# Patient Record
Sex: Male | Born: 1940 | Race: White | Hispanic: No | Marital: Single | Smoking: Current every day smoker
Health system: Southern US, Community
[De-identification: ages and names within clinical notes are randomized; demographics above are authoritative.]

## PROBLEM LIST (undated history)

## (undated) DIAGNOSIS — G20 Parkinson's disease: Secondary | ICD-10-CM

## (undated) DIAGNOSIS — E7800 Pure hypercholesterolemia, unspecified: Secondary | ICD-10-CM

## (undated) DIAGNOSIS — R259 Unspecified abnormal involuntary movements: Secondary | ICD-10-CM

## (undated) DIAGNOSIS — I714 Abdominal aortic aneurysm, without rupture, unspecified: Secondary | ICD-10-CM

## (undated) DIAGNOSIS — T1491XA Suicide attempt, initial encounter: Secondary | ICD-10-CM

## (undated) DIAGNOSIS — F329 Major depressive disorder, single episode, unspecified: Secondary | ICD-10-CM

## (undated) DIAGNOSIS — F32A Depression, unspecified: Secondary | ICD-10-CM

## (undated) DIAGNOSIS — G252 Other specified forms of tremor: Secondary | ICD-10-CM

## (undated) DIAGNOSIS — N189 Chronic kidney disease, unspecified: Secondary | ICD-10-CM

## (undated) DIAGNOSIS — N289 Disorder of kidney and ureter, unspecified: Secondary | ICD-10-CM

## (undated) DIAGNOSIS — F319 Bipolar disorder, unspecified: Secondary | ICD-10-CM

## (undated) DIAGNOSIS — G20A1 Parkinson's disease without dyskinesia, without mention of fluctuations: Secondary | ICD-10-CM

## (undated) HISTORY — DX: Abdominal aortic aneurysm, without rupture, unspecified: I71.40

## (undated) HISTORY — DX: Abdominal aortic aneurysm, without rupture: I71.4

## (undated) HISTORY — DX: Other specified forms of tremor: G25.2

## (undated) HISTORY — DX: Depression, unspecified: F32.A

## (undated) HISTORY — DX: Major depressive disorder, single episode, unspecified: F32.9

## (undated) HISTORY — DX: Pure hypercholesterolemia, unspecified: E78.00

## (undated) HISTORY — DX: Parkinson's disease without dyskinesia, without mention of fluctuations: G20.A1

## (undated) HISTORY — DX: Bipolar disorder, unspecified: F31.9

## (undated) HISTORY — DX: Disorder of kidney and ureter, unspecified: N28.9

## (undated) HISTORY — DX: Suicide attempt, initial encounter: T14.91XA

## (undated) HISTORY — DX: Parkinson's disease: G20

## (undated) HISTORY — DX: Unspecified abnormal involuntary movements: R25.9

## (undated) HISTORY — DX: Chronic kidney disease, unspecified: N18.9

---

## 1970-08-01 HISTORY — PX: KNEE ARTHROSCOPY: SHX127

## 2003-05-06 ENCOUNTER — Emergency Department (HOSPITAL_COMMUNITY): Admission: EM | Admit: 2003-05-06 | Discharge: 2003-05-06 | Payer: Self-pay | Admitting: Emergency Medicine

## 2005-02-09 ENCOUNTER — Inpatient Hospital Stay (HOSPITAL_COMMUNITY): Admission: EM | Admit: 2005-02-09 | Discharge: 2005-04-08 | Payer: Self-pay | Admitting: Emergency Medicine

## 2005-02-09 ENCOUNTER — Ambulatory Visit: Payer: Self-pay | Admitting: Cardiology

## 2005-02-09 ENCOUNTER — Encounter: Payer: Self-pay | Admitting: Cardiology

## 2005-02-09 ENCOUNTER — Ambulatory Visit: Payer: Self-pay | Admitting: Internal Medicine

## 2005-02-24 ENCOUNTER — Ambulatory Visit: Payer: Self-pay | Admitting: Gastroenterology

## 2005-03-01 DIAGNOSIS — T1491XA Suicide attempt, initial encounter: Secondary | ICD-10-CM

## 2005-03-01 HISTORY — DX: Suicide attempt, initial encounter: T14.91XA

## 2005-04-02 ENCOUNTER — Ambulatory Visit: Payer: Self-pay | Admitting: Hospitalist

## 2005-04-08 ENCOUNTER — Inpatient Hospital Stay (HOSPITAL_COMMUNITY): Admission: RE | Admit: 2005-04-08 | Discharge: 2005-04-15 | Payer: Self-pay | Admitting: Psychiatry

## 2005-04-08 ENCOUNTER — Ambulatory Visit: Payer: Self-pay | Admitting: Psychiatry

## 2005-05-04 ENCOUNTER — Ambulatory Visit (HOSPITAL_COMMUNITY): Payer: Self-pay | Admitting: Psychiatry

## 2005-05-23 ENCOUNTER — Ambulatory Visit (HOSPITAL_COMMUNITY): Payer: Self-pay | Admitting: Psychiatry

## 2005-05-30 ENCOUNTER — Ambulatory Visit (HOSPITAL_COMMUNITY): Payer: Self-pay | Admitting: Psychiatry

## 2005-06-05 ENCOUNTER — Emergency Department (HOSPITAL_COMMUNITY): Admission: EM | Admit: 2005-06-05 | Discharge: 2005-06-05 | Payer: Self-pay | Admitting: Emergency Medicine

## 2005-06-06 ENCOUNTER — Ambulatory Visit (HOSPITAL_COMMUNITY): Payer: Self-pay | Admitting: Psychiatry

## 2005-07-11 ENCOUNTER — Ambulatory Visit (HOSPITAL_COMMUNITY): Payer: Self-pay | Admitting: Psychiatry

## 2005-07-19 ENCOUNTER — Ambulatory Visit (HOSPITAL_COMMUNITY): Payer: Self-pay | Admitting: Licensed Clinical Social Worker

## 2005-08-24 ENCOUNTER — Ambulatory Visit (HOSPITAL_COMMUNITY): Payer: Self-pay | Admitting: Psychiatry

## 2005-09-21 ENCOUNTER — Ambulatory Visit (HOSPITAL_COMMUNITY): Payer: Self-pay | Admitting: Licensed Clinical Social Worker

## 2005-10-13 ENCOUNTER — Ambulatory Visit (HOSPITAL_COMMUNITY): Payer: Self-pay | Admitting: Psychiatry

## 2005-10-17 ENCOUNTER — Ambulatory Visit (HOSPITAL_COMMUNITY): Payer: Self-pay | Admitting: Psychiatry

## 2005-11-02 ENCOUNTER — Ambulatory Visit (HOSPITAL_COMMUNITY): Payer: Self-pay | Admitting: Licensed Clinical Social Worker

## 2005-12-19 ENCOUNTER — Ambulatory Visit (HOSPITAL_COMMUNITY): Payer: Self-pay | Admitting: Psychiatry

## 2006-02-16 ENCOUNTER — Ambulatory Visit (HOSPITAL_COMMUNITY): Payer: Self-pay | Admitting: Licensed Clinical Social Worker

## 2006-02-20 ENCOUNTER — Ambulatory Visit (HOSPITAL_COMMUNITY): Payer: Self-pay | Admitting: Psychiatry

## 2006-02-27 ENCOUNTER — Ambulatory Visit (HOSPITAL_COMMUNITY): Payer: Self-pay | Admitting: Licensed Clinical Social Worker

## 2006-03-13 ENCOUNTER — Ambulatory Visit (HOSPITAL_COMMUNITY): Payer: Self-pay | Admitting: Psychiatry

## 2006-03-16 ENCOUNTER — Ambulatory Visit (HOSPITAL_COMMUNITY): Payer: Self-pay | Admitting: Licensed Clinical Social Worker

## 2006-05-01 ENCOUNTER — Ambulatory Visit (HOSPITAL_COMMUNITY): Payer: Self-pay | Admitting: Psychiatry

## 2006-05-16 ENCOUNTER — Ambulatory Visit (HOSPITAL_COMMUNITY): Payer: Self-pay | Admitting: Licensed Clinical Social Worker

## 2006-08-23 ENCOUNTER — Ambulatory Visit (HOSPITAL_COMMUNITY): Payer: Self-pay | Admitting: Psychiatry

## 2006-09-18 ENCOUNTER — Ambulatory Visit (HOSPITAL_COMMUNITY): Payer: Self-pay | Admitting: Psychiatry

## 2006-11-13 ENCOUNTER — Ambulatory Visit (HOSPITAL_COMMUNITY): Payer: Self-pay | Admitting: Psychiatry

## 2006-11-21 ENCOUNTER — Ambulatory Visit (HOSPITAL_COMMUNITY): Payer: Self-pay | Admitting: Licensed Clinical Social Worker

## 2006-12-12 ENCOUNTER — Ambulatory Visit (HOSPITAL_COMMUNITY): Payer: Self-pay | Admitting: Licensed Clinical Social Worker

## 2007-01-03 ENCOUNTER — Ambulatory Visit (HOSPITAL_COMMUNITY): Payer: Self-pay | Admitting: Licensed Clinical Social Worker

## 2007-01-17 ENCOUNTER — Ambulatory Visit (HOSPITAL_COMMUNITY): Payer: Self-pay | Admitting: Psychiatry

## 2007-01-29 ENCOUNTER — Ambulatory Visit (HOSPITAL_COMMUNITY): Payer: Self-pay | Admitting: Licensed Clinical Social Worker

## 2007-02-26 ENCOUNTER — Ambulatory Visit (HOSPITAL_COMMUNITY): Payer: Self-pay | Admitting: Licensed Clinical Social Worker

## 2007-03-14 ENCOUNTER — Ambulatory Visit (HOSPITAL_COMMUNITY): Payer: Self-pay | Admitting: Psychiatry

## 2007-03-26 ENCOUNTER — Ambulatory Visit (HOSPITAL_COMMUNITY): Payer: Self-pay | Admitting: Licensed Clinical Social Worker

## 2007-04-30 ENCOUNTER — Ambulatory Visit (HOSPITAL_COMMUNITY): Payer: Self-pay | Admitting: Licensed Clinical Social Worker

## 2007-05-14 ENCOUNTER — Ambulatory Visit (HOSPITAL_COMMUNITY): Payer: Self-pay | Admitting: Psychiatry

## 2007-05-29 ENCOUNTER — Ambulatory Visit (HOSPITAL_COMMUNITY): Payer: Self-pay | Admitting: Licensed Clinical Social Worker

## 2007-07-23 ENCOUNTER — Ambulatory Visit (HOSPITAL_COMMUNITY): Payer: Self-pay | Admitting: Licensed Clinical Social Worker

## 2007-08-13 ENCOUNTER — Ambulatory Visit (HOSPITAL_COMMUNITY): Payer: Self-pay | Admitting: Psychiatry

## 2007-08-27 ENCOUNTER — Ambulatory Visit (HOSPITAL_COMMUNITY): Payer: Self-pay | Admitting: Licensed Clinical Social Worker

## 2007-10-11 ENCOUNTER — Ambulatory Visit (HOSPITAL_COMMUNITY): Payer: Self-pay | Admitting: Licensed Clinical Social Worker

## 2007-11-07 ENCOUNTER — Ambulatory Visit (HOSPITAL_COMMUNITY): Payer: Self-pay | Admitting: Psychiatry

## 2007-11-28 ENCOUNTER — Ambulatory Visit (HOSPITAL_COMMUNITY): Payer: Self-pay | Admitting: Licensed Clinical Social Worker

## 2008-01-02 ENCOUNTER — Ambulatory Visit (HOSPITAL_COMMUNITY): Payer: Self-pay | Admitting: Licensed Clinical Social Worker

## 2008-02-04 ENCOUNTER — Ambulatory Visit (HOSPITAL_COMMUNITY): Payer: Self-pay | Admitting: Psychiatry

## 2008-03-05 ENCOUNTER — Ambulatory Visit (HOSPITAL_COMMUNITY): Payer: Self-pay | Admitting: Licensed Clinical Social Worker

## 2008-03-24 ENCOUNTER — Ambulatory Visit (HOSPITAL_COMMUNITY): Payer: Self-pay | Admitting: Psychiatry

## 2008-04-14 ENCOUNTER — Ambulatory Visit (HOSPITAL_COMMUNITY): Payer: Self-pay | Admitting: Licensed Clinical Social Worker

## 2008-05-12 ENCOUNTER — Ambulatory Visit (HOSPITAL_COMMUNITY): Payer: Self-pay | Admitting: Licensed Clinical Social Worker

## 2008-06-18 ENCOUNTER — Ambulatory Visit (HOSPITAL_COMMUNITY): Payer: Self-pay | Admitting: Licensed Clinical Social Worker

## 2008-08-11 ENCOUNTER — Ambulatory Visit (HOSPITAL_COMMUNITY): Payer: Self-pay | Admitting: Psychiatry

## 2008-08-18 ENCOUNTER — Ambulatory Visit (HOSPITAL_COMMUNITY): Payer: Self-pay | Admitting: Licensed Clinical Social Worker

## 2008-08-18 ENCOUNTER — Emergency Department (HOSPITAL_COMMUNITY): Admission: EM | Admit: 2008-08-18 | Discharge: 2008-08-18 | Payer: Self-pay | Admitting: Family Medicine

## 2008-09-15 ENCOUNTER — Ambulatory Visit (HOSPITAL_COMMUNITY): Payer: Self-pay | Admitting: Licensed Clinical Social Worker

## 2008-10-28 ENCOUNTER — Ambulatory Visit (HOSPITAL_COMMUNITY): Payer: Self-pay | Admitting: Licensed Clinical Social Worker

## 2008-11-03 ENCOUNTER — Ambulatory Visit (HOSPITAL_COMMUNITY): Payer: Self-pay | Admitting: Psychiatry

## 2008-11-21 ENCOUNTER — Ambulatory Visit: Payer: Self-pay | Admitting: Vascular Surgery

## 2008-11-26 ENCOUNTER — Ambulatory Visit (HOSPITAL_COMMUNITY): Payer: Self-pay | Admitting: Licensed Clinical Social Worker

## 2008-12-17 ENCOUNTER — Ambulatory Visit (HOSPITAL_COMMUNITY): Payer: Self-pay | Admitting: Licensed Clinical Social Worker

## 2009-01-16 ENCOUNTER — Ambulatory Visit (HOSPITAL_COMMUNITY): Payer: Self-pay | Admitting: Licensed Clinical Social Worker

## 2009-01-28 ENCOUNTER — Ambulatory Visit (HOSPITAL_COMMUNITY): Payer: Self-pay | Admitting: Psychiatry

## 2009-02-06 ENCOUNTER — Ambulatory Visit (HOSPITAL_COMMUNITY): Payer: Self-pay | Admitting: Licensed Clinical Social Worker

## 2009-02-09 ENCOUNTER — Ambulatory Visit (HOSPITAL_COMMUNITY): Payer: Self-pay | Admitting: Psychiatry

## 2009-02-11 ENCOUNTER — Ambulatory Visit (HOSPITAL_COMMUNITY): Payer: Self-pay | Admitting: Licensed Clinical Social Worker

## 2009-02-18 ENCOUNTER — Ambulatory Visit (HOSPITAL_COMMUNITY): Payer: Self-pay | Admitting: Licensed Clinical Social Worker

## 2009-03-31 ENCOUNTER — Ambulatory Visit (HOSPITAL_COMMUNITY): Payer: Self-pay | Admitting: Licensed Clinical Social Worker

## 2009-04-08 ENCOUNTER — Ambulatory Visit (HOSPITAL_COMMUNITY): Payer: Self-pay | Admitting: Psychiatry

## 2009-04-14 ENCOUNTER — Ambulatory Visit (HOSPITAL_COMMUNITY): Payer: Self-pay | Admitting: Licensed Clinical Social Worker

## 2009-04-22 ENCOUNTER — Ambulatory Visit (HOSPITAL_COMMUNITY): Payer: Self-pay | Admitting: Licensed Clinical Social Worker

## 2009-05-05 ENCOUNTER — Ambulatory Visit (HOSPITAL_COMMUNITY): Payer: Self-pay | Admitting: Psychiatry

## 2009-05-20 ENCOUNTER — Ambulatory Visit (HOSPITAL_COMMUNITY): Payer: Self-pay | Admitting: Psychiatry

## 2009-05-22 ENCOUNTER — Ambulatory Visit (HOSPITAL_COMMUNITY): Payer: Self-pay | Admitting: Licensed Clinical Social Worker

## 2009-06-10 ENCOUNTER — Ambulatory Visit (HOSPITAL_COMMUNITY): Payer: Self-pay | Admitting: Licensed Clinical Social Worker

## 2009-07-02 ENCOUNTER — Ambulatory Visit (HOSPITAL_COMMUNITY): Payer: Self-pay | Admitting: Licensed Clinical Social Worker

## 2009-07-29 ENCOUNTER — Ambulatory Visit (HOSPITAL_COMMUNITY): Payer: Self-pay | Admitting: Licensed Clinical Social Worker

## 2009-08-10 ENCOUNTER — Ambulatory Visit (HOSPITAL_COMMUNITY): Payer: Self-pay | Admitting: Psychiatry

## 2009-09-04 ENCOUNTER — Ambulatory Visit (HOSPITAL_COMMUNITY): Payer: Self-pay | Admitting: Licensed Clinical Social Worker

## 2009-09-21 ENCOUNTER — Ambulatory Visit (HOSPITAL_COMMUNITY): Payer: Self-pay | Admitting: Licensed Clinical Social Worker

## 2009-10-20 ENCOUNTER — Ambulatory Visit (HOSPITAL_COMMUNITY): Payer: Self-pay | Admitting: Licensed Clinical Social Worker

## 2009-11-06 ENCOUNTER — Ambulatory Visit (HOSPITAL_COMMUNITY): Payer: Self-pay | Admitting: Psychiatry

## 2009-11-19 ENCOUNTER — Ambulatory Visit (HOSPITAL_COMMUNITY): Payer: Self-pay | Admitting: Licensed Clinical Social Worker

## 2009-11-27 ENCOUNTER — Ambulatory Visit: Payer: Self-pay | Admitting: Vascular Surgery

## 2009-12-16 ENCOUNTER — Ambulatory Visit (HOSPITAL_COMMUNITY): Payer: Self-pay | Admitting: Licensed Clinical Social Worker

## 2010-01-18 ENCOUNTER — Ambulatory Visit (HOSPITAL_COMMUNITY): Payer: Self-pay | Admitting: Licensed Clinical Social Worker

## 2010-02-03 ENCOUNTER — Ambulatory Visit (HOSPITAL_COMMUNITY): Payer: Self-pay | Admitting: Psychiatry

## 2010-02-15 ENCOUNTER — Ambulatory Visit (HOSPITAL_COMMUNITY): Payer: Self-pay | Admitting: Licensed Clinical Social Worker

## 2010-03-01 ENCOUNTER — Ambulatory Visit (HOSPITAL_COMMUNITY): Payer: Self-pay | Admitting: Licensed Clinical Social Worker

## 2010-03-31 ENCOUNTER — Ambulatory Visit (HOSPITAL_COMMUNITY): Payer: Self-pay | Admitting: Licensed Clinical Social Worker

## 2010-04-07 ENCOUNTER — Ambulatory Visit (HOSPITAL_COMMUNITY): Payer: Self-pay | Admitting: Psychiatry

## 2010-05-03 ENCOUNTER — Ambulatory Visit (HOSPITAL_COMMUNITY): Payer: Self-pay | Admitting: Licensed Clinical Social Worker

## 2010-06-04 ENCOUNTER — Ambulatory Visit (HOSPITAL_COMMUNITY): Payer: Self-pay | Admitting: Licensed Clinical Social Worker

## 2010-07-01 ENCOUNTER — Ambulatory Visit (HOSPITAL_COMMUNITY): Payer: Self-pay | Admitting: Licensed Clinical Social Worker

## 2010-08-04 ENCOUNTER — Ambulatory Visit (HOSPITAL_COMMUNITY)
Admission: RE | Admit: 2010-08-04 | Discharge: 2010-08-04 | Payer: Self-pay | Source: Home / Self Care | Attending: Psychiatry | Admitting: Psychiatry

## 2010-08-16 ENCOUNTER — Ambulatory Visit (HOSPITAL_COMMUNITY)
Admission: RE | Admit: 2010-08-16 | Discharge: 2010-08-16 | Payer: Self-pay | Source: Home / Self Care | Attending: Licensed Clinical Social Worker | Admitting: Licensed Clinical Social Worker

## 2010-10-05 ENCOUNTER — Encounter (HOSPITAL_COMMUNITY): Payer: Self-pay | Admitting: Licensed Clinical Social Worker

## 2010-10-12 ENCOUNTER — Encounter (HOSPITAL_COMMUNITY): Payer: Medicare Other | Admitting: Licensed Clinical Social Worker

## 2010-10-12 DIAGNOSIS — F39 Unspecified mood [affective] disorder: Secondary | ICD-10-CM

## 2010-10-21 ENCOUNTER — Ambulatory Visit (HOSPITAL_COMMUNITY)
Admission: RE | Admit: 2010-10-21 | Discharge: 2010-10-21 | Disposition: A | Discharge status: Home / Self Care | Payer: Medicare Other | Source: Ambulatory Visit | Attending: Psychiatry | Admitting: Psychiatry

## 2010-10-21 DIAGNOSIS — Z79899 Other long term (current) drug therapy: Secondary | ICD-10-CM | POA: Insufficient documentation

## 2010-10-21 DIAGNOSIS — F39 Unspecified mood [affective] disorder: Secondary | ICD-10-CM | POA: Insufficient documentation

## 2010-10-21 DIAGNOSIS — F319 Bipolar disorder, unspecified: Secondary | ICD-10-CM | POA: Insufficient documentation

## 2010-10-21 LAB — CBC
HCT: 46.3 % (ref 39.0–52.0)
Hemoglobin: 15.1 g/dL (ref 13.0–17.0)
MCH: 28.9 pg (ref 26.0–34.0)
MCHC: 32.6 g/dL (ref 30.0–36.0)
MCV: 88.5 fL (ref 78.0–100.0)
Platelets: 161 10*3/uL (ref 150–400)
RBC: 5.23 MIL/uL (ref 4.22–5.81)
RDW: 13.2 % (ref 11.5–15.5)
WBC: 6.8 10*3/uL (ref 4.0–10.5)

## 2010-10-21 LAB — COMPREHENSIVE METABOLIC PANEL
ALT: 11 U/L (ref 0–53)
AST: 15 U/L (ref 0–37)
Albumin: 3.7 g/dL (ref 3.5–5.2)
Alkaline Phosphatase: 130 U/L — ABNORMAL HIGH (ref 39–117)
BUN: 18 mg/dL (ref 6–23)
CO2: 24 mEq/L (ref 19–32)
Calcium: 9.9 mg/dL (ref 8.4–10.5)
Chloride: 104 mEq/L (ref 96–112)
Creatinine, Ser: 1.28 mg/dL (ref 0.4–1.5)
GFR calc Af Amer: >60 mL/min (ref >60)
GFR calc non Af Amer: 56 mL/min — ABNORMAL LOW (ref >60)
Glucose, Bld: 108 mg/dL — ABNORMAL HIGH (ref 70–99)
Potassium: 4.8 mEq/L (ref 3.5–5.1)
Sodium: 136 mEq/L (ref 135–145)
Total Bilirubin: 0.3 mg/dL (ref 0.3–1.2)
Total Protein: 7.6 g/dL (ref 6.0–8.3)

## 2010-10-21 LAB — HEMOGLOBIN A1C
Hgb A1c MFr Bld: 5.8 % — ABNORMAL HIGH (ref <5.7)
Mean Plasma Glucose: 120 mg/dL — ABNORMAL HIGH (ref <117)

## 2010-11-03 ENCOUNTER — Encounter (HOSPITAL_COMMUNITY): Payer: Medicare Other | Admitting: Psychiatry

## 2010-11-03 DIAGNOSIS — F39 Unspecified mood [affective] disorder: Secondary | ICD-10-CM

## 2010-11-09 ENCOUNTER — Ambulatory Visit (INDEPENDENT_AMBULATORY_CARE_PROVIDER_SITE_OTHER): Payer: Medicare Other | Admitting: Vascular Surgery

## 2010-11-09 ENCOUNTER — Encounter (INDEPENDENT_AMBULATORY_CARE_PROVIDER_SITE_OTHER): Payer: Medicare Other

## 2010-11-09 DIAGNOSIS — I714 Abdominal aortic aneurysm, without rupture, unspecified: Secondary | ICD-10-CM

## 2010-11-09 NOTE — Assessment & Plan Note (Signed)
OFFICE VISIT  Fernando Carr, Fernando Carr DOB:  Dec 07, 1940                                       11/09/2010 ZOXWR#:[id number]  Patient presents today for continued follow-up of his asymptomatic infrarenal abdominal aortic aneurysm.  I saw him 1 year ago where he had initial discovery of an aneurysm when he was having an ultrasound of his kidneys to evaluate renal insufficiency.  He reports that he has had stable renal function and has been discharged from active followup with Dr. Hyman Hopes.  He has no major medical difficulties and no new major medical problems.  He does not have any cardiac disease.  No symptoms referable to his aneurysm.  Denies hypertension.  He does have chronic cigarette smoking.  PHYSICAL EXAMINATION:  A well-developed and well-nourished white male appearing stated age in no acute stress.  Blood pressure is 111/70, pulse 59, respirations 18.  HEENT is normal.  His abdomen is soft, nontender, moderately obese.  I do not feel an aneurysm. Musculoskeletal shows no major deformity or cyanosis.  Neurologic:  No focal weakness or paresthesias.  Skin without ulcers or rashes.  He does have 2+ right anterior tibial and 2+ left posterior tibial pulse and does not have any evidence of popliteal artery aneurysms by physical exam.  He underwent repeat ultrasound of his aorta, which I have ordered and independently reviewed.  This does show no change with a maximal diameter of 3.5, which is the same as a study from 1 year ago.  I discussed this at length with patient. I explained that this is very reassuring with a relatively small aneurysm and no change in 1 year.  We will continue to see him in our noninvasive vascular lab follow-up protocol to rule out any enlargement in his aneurysm.    Larina Earthly, M.D. Electronically Signed  TFE/MEDQ  D:  11/09/2010  T:  11/09/2010  Job:  [id number]  cc:   Dr. Cherylann Ratel

## 2010-11-23 NOTE — Procedures (Unsigned)
DUPLEX ULTRASOUND OF ABDOMINAL AORTA  INDICATION:  Abdominal aortic aneurysm  HISTORY: Diabetes:  no Cardiac:  no Hypertension:  no Smoking:  yes Connective Tissue Disorder: Family History:  no Previous Surgery:  No  DUPLEX EXAM:         AP (cm)                   TRANSVERSE (cm) Proximal             2.97 cm                   2.97 cm Mid                  2.7 cm                    2.7 cm Distal               3.5 cm                    3.5 cm Right Iliac          Not visualized            Not visualized Left Iliac           Not visualized            Not visualized  PREVIOUS:  Date: 11/27/2009  AP:  3.5  TRANSVERSE:  3.4  IMPRESSION: 1. Aneurysmal dilatation of the distal abdominal aorta noted with no     significant change in maximum diameter when compared to previous     exam. 2. Unable to adequately visualize the bilateral common iliac arteries     due to overlying bowel gas patterns and patient body habitus.  ___________________________________________ Larina Earthly, M.D.  CH/MEDQ  D:  11/09/2010  T:  11/09/2010  Job:  [id number]

## 2010-12-13 ENCOUNTER — Encounter (HOSPITAL_BASED_OUTPATIENT_CLINIC_OR_DEPARTMENT_OTHER): Payer: Medicare Other | Admitting: Licensed Clinical Social Worker

## 2010-12-13 DIAGNOSIS — F39 Unspecified mood [affective] disorder: Secondary | ICD-10-CM

## 2010-12-14 NOTE — Consult Note (Signed)
NEW PATIENT CONSULTATION   Fernando Carr, Fernando Carr  DOB:  03/01/1941                                       11/21/2008  ONGEX#:[id number]   The patient presents today for evaluation of incidental finding of an  infrarenal abdominal aortic aneurysm.  He had renal insufficiency with a  consultation by Dr. Elvis Coil.  His evaluation included an ultrasound  of his kidneys showing normal size.  He had evidence of a finding of a  small to moderate distal abdominal aortic aneurysm.  We are seeing him  for further discussion.  He does not have any prior history or knowledge  of this.  Apparently, his renal function has improved and his creatinine  is back to 1.3.  His past history is significant for elevated  cholesterol.  He does not have any cardiac history.  He does have any  strong family history of premature atherosclerotic disease.   SOCIAL HISTORY:  He is single with 1 child.  He is a retired Runner, broadcasting/film/video.  He does smoke 1 pack of cigarettes per day and is attempting to quit.  He has a history of alcohol abuse in the past and has not drunk since  1996.   REVIEW OF SYSTEMS:  Positive for weight gain up to 253 pounds.  He is 6  feet tall.  He has no cardiac, pulmonary or GI disease.  He does have  urinary frequency.   MEDICATION ALLERGIES:  None.   MEDICATIONS:  1. Lexapro 10 mg daily.  2. Simvastatin 40 mg daily.  3. Vitamin D.   PHYSICAL EXAMINATION:  Well-developed, well-nourished, appearing staged  age of 89, pulse 72, respirations 18, his radial pulses are 2+  bilaterally.  He has palpable popliteal pulses, diminished distal  pulses.  Abdomen:  Reveals obesity and I do not feel any aneurysms, he  does not have any specific tenderness.   He underwent repeat ultrasound today and this did show a maximal  diameter of 3.5 cm of his infrarenal aneurysm.  I discussed this at  length with the patient.  I explained that this does not put him at any  increased risk for  rupture at this time and that we would follow this  with ultrasound.  I explained that there is no limitation as far as his  activity or his positioning.  I again stated to him the critical  importance of smoking cessation and he will continue to attempt this.  We will see him again in 1 year with repeat ultrasound at that time.  I  did review symptoms of leaking aneurysm and he knows to report  immediately to the emergency room at [facility] should this occur.   Larina Earthly, M.D.  Electronically Signed   TFE/MEDQ  D:  11/21/2008  T:  11/24/2008  Job:  [id number]   cc:   Garnetta Buddy, M.D.  Dareen Piano, Dr.

## 2010-12-14 NOTE — Procedures (Signed)
DUPLEX ULTRASOUND OF ABDOMINAL AORTA   INDICATION:  Abdominal aortic aneurysm.   HISTORY:  Diabetes:  No.  Cardiac:  No.  Hypertension:  No.  Smoking:  Yes.  Connective Tissue Disorder:  Family History:  No.  Previous Surgery:  No.   DUPLEX EXAM:         AP (cm)                   TRANSVERSE (cm)  Proximal             3.2 cm                    3.0 cm  Mid                  2.6 cm                    2.4 cm  Distal               3.3 cm                    3.5 cm  Right Iliac          Not visualized            Not visualized  Left Iliac           Not visualized            Not visualized   PREVIOUS:  Date:  AP:  TRANSVERSE:   IMPRESSION:  1. Aneurysmal dilatation of the distal abdominal aorta noted, as      described above.  2. Unable to adequately visualize the bilateral common iliac arteries      due to patient body habitus and overlying bowel gas patterns.   ___________________________________________  Larina Earthly, M.D.   CH/MEDQ  D:  11/21/2008  T:  11/21/2008  Job:  [id number]

## 2010-12-14 NOTE — Procedures (Signed)
DUPLEX ULTRASOUND OF ABDOMINAL AORTA   INDICATION:  Abdominal aortic aneurysm.   HISTORY:  Diabetes:  No.  Cardiac:  No.  Hypertension:  No.  Smoking:  Yes.  Connective Tissue Disorder:  Family History:  No.  Previous Surgery:  No.   DUPLEX EXAM:         AP (cm)                   TRANSVERSE (cm)  Proximal             3.1 cm                    3.1 cm  Mid                  2.3 cm                    2.5 cm  Distal               3.5 cm                    3.4 cm  Right Iliac          Not visualized            Not visualized  Left Iliac           Not visualized            Not visualized   PREVIOUS:  Date: 11/21/2008  AP:  3.3  TRANSVERSE:  3.5   IMPRESSION:  1. Aneurysm of the distal abdominal aorta noted with no significant      change in the maximum diameter when compared to the previous      examination.  2. Unable to adequately visualize the bilateral common iliac arteries      due to overlying bowel gas patterns and patient body habitus.   ___________________________________________  Larina Earthly, M.D.   CH/MEDQ  D:  11/27/2009  T:  11/27/2009  Job:  [id number]

## 2010-12-17 NOTE — H&P (Signed)
NAME:  Fernando Carr, Fernando Carr NO.:  [id number]   MEDICAL RECORD NO.:  [id number]          PATIENT TYPE:  IPS   LOCATION:  0402                          [facility]ACILITY:  [facility]   PHYSICIAN:  Jeanice Lim, M.D. DATE OF BIRTH:  01-Oct-1940   DATE OF ADMISSION:  04/08/2005  DATE OF DISCHARGE:                         PSYCHIATRIC ADMISSION ASSESSMENT   This is a voluntary admission to the services of Dr. Aleatha Borer.   IDENTIFYING INFORMATION:  This is a 70 year old single white male.  Apparently the patient presented for admission on February 08, 2005 after he had  ingested ethylene glycol in a suicide attempt.  The patient states that he  drank the antifreeze because of a financial bind he had gotten himself into.  He states I have had terrible financial judgments lately.  He saw this as  the only way to address his financial situation.  He has had a protracted  course.  During his hospital stay he underwent a Fluro guided tunnel central  venous catheter placement for dialysis on February 14, 2005; this was removed on  March 30, 2005.  His acute renal failure improved gradually.  He was  actually removed off of dialysis March 22, 2005.  His creatinine has been  decreasing every time it was checked.  It is extremely unlikely at this time  that he will need continued hemodialysis.  His mental status was felt to be  back to baseline.  Initially he had elevated blood pressure and  hyperglycemia, but it has been within normal limits without medications for  over a month. During his hospitalization he had several medical problems.  The first problem being diarrhea.  He had a 10-day course of p.o. Flagyl  even thought the Clostridium difficile toxin was negative x 3.  This has now  completely resolved.  He was noted to have hyperglycemia.  He has been off  of insulin therapy for over a month.  CBGs are now ranging from 70 to 97.  Hypertension, he has been off of antihypertensive medications  for over a  month now.  Apparently on April 05, 2005, Mr. Veley verbalized to another  patient that he sometimes felt like there was no way out of his problems but  to kill himself.  Dr. Jeanie Sewer was reconsulted with recommendations for  hospitalization in a psych [facility].  On April 07, 2005 Dr. Jeanie Sewer  reassessed the patient for the third time.  The first time Dr. Jeanie Sewer saw  him was February 23, 2005, the second time March 12, 2005 and third time was  April 07, 2005.  On April 07, 2005, Dr. Jeanie Sewer felt that he was  alert and oriented x 3, that his thought processes were coherent.  He did  not have suicidal ideation; however, he did have hopelessness.  He was  negative for homicidal ideation.  He was negative for delusions and  hallucinations.  His memory had 3/3 immediate and 2/3 at 5 minutes.  His  affect was constricted.  His judgment was intact for need of treatment.  His  insight was partial.  It was Dr. Providence Crosby assessment that AXIS I:  Mood  disorder, not otherwise specified.  Depression versus major depressive,  severe.  However there was some consideration that he might in fact be  bipolar.  Today the patient tells me that back in the spring he was  exhibiting terrible financial judgments.  He was impulsive, and he also  states that he is very lonely.  He just does not know if he can stand being  lonely anymore.  Apparently he has some properties in [place].  He was in  [place] being convicted of a DUI right before [place].  He was  sentenced to do community work which he did during the [place].  Again  this year he was down in [place] in the spring to handle some property  issues, and he said he was actually sleeping in his car and again he was  served with a DUI even though he was not driving.  He refused to take a  Breathalyzer test, and he did not indicate to me whether in fact he had been  imbibing at the time that the police noted his  presence.  Today the thing  that would help him the most would be to get some sort of court appointed  help to help him handle his financial issues.  He states that prior to that  it would just be a matter of time until he became suicidal again.   PAST PSYCHIATRIC HISTORY:  He states prior to this he does not really have  any prior history.   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  He is  retired from Programme researcher, broadcasting/film/video.  He has a daughter age 29 who just started at [employer] in [place].  Her mother also lives here in [place].   FAMILY HISTORY:  He denies.   ALCOHOL AND DRUG HISTORY:  He says he used to drink and smoke, but he not  since he was admitted to the hospital February 08, 2005.   PRIMARY CARE Kelan Pritt:  Not indicated.   MEDICAL PROBLEMS:  Already outlined.   CURRENT MEDICATIONS:  1.  Pepcid 20 mg p.o. daily.  2.  Risperdal 0.5 mg at 1800.  3.  Folic acid 1 mg p.o. daily.  4.  Zocor 5 mg at 1800.  5.  Lexapro 15 mg p.o. daily.   DRUG ALLERGIES:  No known drug allergies.   POSITIVE PHYSICAL FINDINGS:  He has no outstanding physical findings at this  time.  Vital signs on admission, he is 6 feet 1/2 inch tall, weight 186  pounds, temperature 97.7, blood pressure 114/88 to 102/76, pulse 69 to 77,  respirations 18.  He has a healing site on his right upper chest from where  his central line was removed.  The remainder of his physical exam is  detailed in great length on the main hospital chart.   MENTAL STATUS EXAM:  Today he is alert and oriented x 3.  He is casually  groomed and dressed and adequately nourished.  His speech is a normal rate,  rhythm and tone.  His mood has some ups and downs as you talk to him.  His  affect is congruent.  His thought processes can wander, but he is easily  redirected.  Judgment and insight are fair.  Concentration and memory are intact.  Intelligence is at least average.  He denies suicidal or  homicidal  ideation.  He denies auditory  or visual hallucinations.   ADMISSION DIAGNOSIS:  AXIS I:  Bipolar disorder currently depressed.  AXIS II:  Deferred.  AXIS III:  Gastrointestinal distress for which he is getting Pepcid.  AXIS IV:  Severe.  He has problems with primary support group, housing  issues, economic issues, and problems related to the legal system.  AXIS V:  15.   PLAN:  Optimize his treatment.  Toward that end we will increase his  Risperdal to 1 mg at h.s.  We will have the case manager help get him some  legal assistance.     Mickie Leonarda Salon, P.A.-C.      Jeanice Lim, M.D.  Electronically Signed   MD/MEDQ  D:  04/09/2005  T:  04/09/2005  Job:  [id number]

## 2010-12-17 NOTE — Discharge Summary (Signed)
NAMEDA, AUTHEMENT NO.:  [id number]   MEDICAL RECORD NO.:  [id number]          PATIENT TYPE:  INP   LOCATION:  5504                         FACILITY:  [facility]   PHYSICIAN:  Alvester Morin, M.D.  DATE OF BIRTH:  [birth year]   DATE OF ADMISSION:  02/08/2005  DATE OF DISCHARGE:  04/07/2005                                 DISCHARGE SUMMARY   DISCHARGE DIAGNOSES:  1.  Status post injection of ethylene glycol as a suicide attempt.  2.  Acute renal failure secondary to #1.  3.  Depression, possibly bipolar disorder.  4.  Alcohol abuse.  5.  Dyslipidemia.   DISCHARGE MEDICATIONS:  1.  Pepcid 20 mg p.o. daily.  2.  Risperdal 0.5 mg p.o. daily.  3.  Lexapro 15 mg p.o. daily.  4.  Folic acid 1 mg p.o. daily.  5.  Zocor 5 mg p.o. daily.  6.  Tylenol 650 mg p.o. q.4 h. p.r.n. for pain.   CONSULTANTS:  1.  Dr. Jeanie Sewer in Psychiatry.  2.  Dr. Briant Cedar in Nephrology.   PROCEDURES:  1.  February 08, 2005, CT of the head which showed mild generalized atrophy.  2.  February 09, 2005, MRI of the brain which showed mild atrophy and chronic      sinusitis.  3.  February 09, 2005, renal ultrasound which showed no acute findings or      hydronephrosis.  4.  February 09, 2005, 2-D echocardiogram which showed left ventricular ejection      fraction of 55% to 65% with mild right ventricular hypertrophy.  5.  February 14, 2005, fluoroscopically guided tunneled central venous catheter      placement for dialysis.  6.  March 30, 2005, fluoroscopically guided removal of tunneled      hemodialysis catheter.   DISPOSITION AT DISCHARGE:  Mr. Overbaugh physical and medical status is now  stable and has been for over 2 weeks now.  His acute renal failure has been  improving gradually.  He has been off of dialysis since March 22, 2005 with  the creatinine decreasing every time it is checked (last value of 2.6 on  April 05, 2005).  It is extremely unlikely that he will need hemodialysis  in the  future.  His mental status is now back to baseline.  He had elevated  blood pressure and  hyperglycemia while in the hospital, but has been within  normal limits without medications for over 1 month.  Dr. Jeanie Sewer, who  reevaluated him on April 07, 2005, assessed that he needs to be admitted  to the psychiatry [facility] for further evaluation.   ADMITTING HISTORY AND PHYSICAL:  Mr. Nardozzi is a 70 year old man previously  healthy except for some symptoms suggestive of bipolar disorder, who was  found confused and acting strangely by his daughter.  He had recently been  depressed secondary to financial problems and a history of driving under the  influence of alcohol with an ongoing court case in [place].  He had  apparently verbalized to his daughter, I am sorry, I love  you and I am  better off dead.   On physical exam, he was tachypneic, blood pressure 148/91, pulse 72,  afebrile, saturation of oxygen 100% on 7 L.  He was unresponsive, became  apneic and was intubated.  Neurological exam was nonfocal.  Neck was supple  .  Heart and lung exam was grossly normal, as well as the abdomen.  He had  good distal pulses without cyanosis.   LABORATORY DATA:  White blood cell count 14.3, hematocrit 15.1, platelets  234,000.  Sodium 141, potassium 5.3, chloride 106, CO2 6, BUN 11, creatinine  1.5, glucose 100.  Anion gap 29.  Liver function tests and liver enzymes  within normal limits.  Ethanol level was less than 5.  Arterial blood gas  showed a pH of 7.12, PCO2 of 14, PO2 of 5, bicarbonates of 500, lactic acid  of 1.4.  Cardiac panel was negative.   EKG showed nonspecific changes.   Depakote level was less than 10.  Acetaminophen was 40.0.  Salicylate was  less than 4.0.  Urine drug screen was negative.  Lithium level was less than  0.25.   A head CT showed no abnormalities.   A serum osmolality was 314.   HOSPITAL COURSE:  On this basis with an metabolic acidosis with elevated  anion  gap and osmolar gas, we treated him with 2 doses of fomepizole and  started dialysis.  We later got results from the ethylene glycol levels  which were 28.9 and down to 8 after 1 day of treatment.  His mental status  slowly improved, fully awake and oriented by the beginning of August.  He  was extubated on February 17, 2005.  On February 14, 2005, Mr. Kresse was taken to  Radiology to get a tunneled hemodialysis catheter for a constantly rising  creatinine in the range of 6-11.  His last hemodialysis session was on  March 22, 2005 with a creatinine pre-dialysis of 5.3 and on March 24, 2005, this was down to 4.6 and has been constantly decreasing since to a  level now of 2.6 on April 05, 2005.  The tunneled catheter was removed on  March 30, 2005, where he will no longer need hemodialysis.  During  hospitalization, a couple of problems occurred:  #1 - Diarrhea -- this was  treated with a 10-day course of p.o. Flagyl, even though the Clostridium  difficile toxin assay was negative x3.  This has now completely resolved.  #2 - Hyperglycemia -- he has been off of insulin therapy for over 1 month  with CBGs now ranging from 70-97 without any diabetes medication.  #3 -  Hypertension -- he has been off of antihypertensive medications for over 1  month now with his blood pressure well-controlled around 120/70.  During the  past 2 weeks, we had been monitoring Mr. Calabretta renal function while trying  to place him in a facility.  On April 05, 2005, Mr. Vicens verbalized to  Dr. Lorel Monaco that he sometimes still felt there was no way out of his problems  but to kill himself.  Dr. Jeanie Sewer was reconsulted and recommended  hospitalization in the psychiatric [facility] for further evaluation.   DISCHARGE LABORATORY DATA AND VITALS:  Temperature 98.4, pulse of 61,  respirations 16 per minute, blood pressure 115/61, saturation of oxygen 97%  on room air.  Sodium 141, potassium 4.5, chloride 110, CO2 25, glucose 79,  BUN 38,  creatinine 2.6.  White blood cell count 6.5, hemoglobin  13.2, platelets  207,000.      Dellia Beckwith, M.D.    ______________________________  Alvester Morin, M.D.    VD/MEDQ  D:  04/13/2005  T:  04/14/2005  Job:  [id number]   cc:   Dellia Beckwith, M.D.  Fax: [phone]   Antonietta Breach, M.D.   Dyke Maes, M.D.  [address]  [place]  [place] [address]  Fax: [phone]

## 2010-12-17 NOTE — Discharge Summary (Signed)
NAME:  ELEAZAR, KIMMEY NO.:  [id number]   MEDICAL RECORD NO.:  [id number]          PATIENT TYPE:  IPS   LOCATION:  0401                          FACILITY:  [facility]   PHYSICIAN:  Jeanice Lim, M.D. DATE OF BIRTH:  Dec 16, 1940   DATE OF ADMISSION:  04/08/2005  DATE OF DISCHARGE:  04/15/2005                                 DISCHARGE SUMMARY   IDENTIFYING DATA:  A 70 year old single Caucasian male presenting after  ingesting ethylene glycol in a suicide attempt, reportedly drank antifreeze  due to financial bind he had gotten himself in to terrible financial  judgments lately.  The only way to address his financial situation was to  kill himself.  Had a protracted course during his hospital stay, underwent  fluoro-guided tunnel central venous catheter placement for dialysis on  August 30.  This was refused.  Acute renal failure gradually improved.  He  was taken off of dialysis.  Creatinine had been decreasing gradually.  Mental status was considered to be close to baseline, however there seemed  to be some confusion and memory issues and impaired judgment.  The patient  apparently had bought multiple condos and properties despite having a fixed  income which was more than adequate but financial judgment was clearly  impaired.  Mr. Carel verbalized to another patient that he felt that there  was no way out of his problems but to kill himself.  Dr. Jeanie Sewer consulted  several times for psychiatric evaluation and felt that the patient needed  further psychiatric treatment.  There was also a history of alcohol use and  DUI in addition to his possible mood instability and suicidal thoughts prior  to suicide attempt, which was near lethal and resulted in possible permanent  damage and nearly placed on dialysis.  Past psychiatric history:  Denies  previous psychiatric history.  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  Had residences in both [place].  Admits to  having a drink on occasion.  No primary care physician.   ADMISSION MEDICATIONS:  Pepcid, Risperdal, folic acid, Zocor, and Lexapro.   ALLERGIES:  No known drug allergies.   PHYSICAL AND NEUROLOGICAL EXAMINATION:  Within normal limits.   ROUTINE ADMISSION LABS:  Essentially within normal limits.  There is a right  upper chest area healing but no signs of infection where central line had  been.   MENTAL STATUS EXAM:  Alert and oriented, casually groomed and dressed.  Speech within normal limits.  Mood somewhat drowsy as you talked to him,  thought process wanders at times but easily redirected.  Judgment and  insight were fair, other than financial investment judgment.  Cognition was  grossly intact and the patient denied suicidal or homicidal ideation or  psychotic symptoms.   ADMISSION DIAGNOSES:  AXIS I:  Likely bipolar disorder, currently depressed,  status post near-lethal overdose with antifreeze, resulting in medical  complications.  AXIS II:  Deferred.  AXIS III:  Gastrointestinal distress for which he is on Pepcid.  AXIS IV:  Severe, problems with  primary support group, housing, economic  issues, limited support system, and also problems with legal system.  AXIS V:  20/65.   HOSPITAL COURSE:  The patient was admitted and ordered routine p.r.n.  medications, underwent further monitoring, and was encouraged to participate  in individual, group and milieu therapy as tolerated.  History was obtained  through multiple family members, support system mobilized and family  assisted in aftercare planning.  The patient agreed to short-term placement  for further recovery and stabilization of mood and judgment, as well as  physical status before moving on to be more independent.  He was started on  Provigil, optimized on Lexapro and decreased on Risperdal to use lowest  effective dose of Risperdal to help with daytime somnolence and alertness  and target  depressive symptoms with Lexapro.  He was given medication  education and discharged in improved condition, no dangerous ideation,  improved judgment and insight and cognition, given medication education at  time of discharge again, and discharged on:  1.  Zocor 5 mg q.a.m.  2.  Pepcid 20 mg q.a.m.  3.  Provigil 100 mg q.7 a.m.  4.  Lexapro 10 mg q.a.m.  5.  Risperdal 0.5 mg q.8 p.m.  6.  Ambien 10 mg q.h.s. p.r.n. insomnia.   DISPOSITION:  The patient was to follow up with Dr. Lolly Mustache on September 25  at 9 a.m. and for therapy and possible neuropsych testing.  The patient was  discharged in improved condition with no risk issues.   DISCHARGE DIAGNOSES:  AXIS I:  Likely bipolar disorder, currently depressed,  status post near-lethal overdose with antifreeze, resulting in medical  complications.  AXIS II:  Deferred.  AXIS III:  Gastrointestinal distress for which he is on Pepcid.  AXIS IV:  Severe, problems with primary support group, housing, economic  issues, limited support system, and also problems with legal system.  AXIS V:  Global assessment of function on discharge was 55.      Jeanice Lim, M.D.  Electronically Signed     JEM/MEDQ  D:  05/30/2005  T:  05/30/2005  Job:  [id number]

## 2011-02-04 ENCOUNTER — Encounter (HOSPITAL_COMMUNITY): Payer: Medicare Other | Admitting: Psychiatry

## 2011-02-11 ENCOUNTER — Encounter (INDEPENDENT_AMBULATORY_CARE_PROVIDER_SITE_OTHER): Payer: Medicare Other | Admitting: Psychiatry

## 2011-02-11 DIAGNOSIS — F39 Unspecified mood [affective] disorder: Secondary | ICD-10-CM

## 2011-02-14 ENCOUNTER — Encounter (HOSPITAL_BASED_OUTPATIENT_CLINIC_OR_DEPARTMENT_OTHER): Payer: Medicare Other | Admitting: Licensed Clinical Social Worker

## 2011-02-14 DIAGNOSIS — F39 Unspecified mood [affective] disorder: Secondary | ICD-10-CM

## 2011-03-24 DIAGNOSIS — E785 Hyperlipidemia, unspecified: Secondary | ICD-10-CM | POA: Insufficient documentation

## 2011-04-11 ENCOUNTER — Encounter (HOSPITAL_COMMUNITY): Payer: Medicare Other | Admitting: Licensed Clinical Social Worker

## 2011-05-03 ENCOUNTER — Encounter (INDEPENDENT_AMBULATORY_CARE_PROVIDER_SITE_OTHER): Payer: Medicare Other | Admitting: Licensed Clinical Social Worker

## 2011-05-03 DIAGNOSIS — F39 Unspecified mood [affective] disorder: Secondary | ICD-10-CM

## 2011-05-06 ENCOUNTER — Encounter (INDEPENDENT_AMBULATORY_CARE_PROVIDER_SITE_OTHER): Payer: Medicare Other | Admitting: Psychiatry

## 2011-05-06 DIAGNOSIS — F39 Unspecified mood [affective] disorder: Secondary | ICD-10-CM

## 2011-07-29 ENCOUNTER — Encounter (HOSPITAL_COMMUNITY): Payer: Medicare Other | Admitting: Psychiatry

## 2011-08-03 ENCOUNTER — Ambulatory Visit (HOSPITAL_COMMUNITY): Payer: Medicare Other | Admitting: Licensed Clinical Social Worker

## 2011-08-15 ENCOUNTER — Ambulatory Visit (INDEPENDENT_AMBULATORY_CARE_PROVIDER_SITE_OTHER): Payer: Medicare Other | Admitting: Psychiatry

## 2011-08-15 ENCOUNTER — Encounter (HOSPITAL_COMMUNITY): Payer: Self-pay | Admitting: Psychiatry

## 2011-08-15 VITALS — Wt 270.0 lb

## 2011-08-15 DIAGNOSIS — F329 Major depressive disorder, single episode, unspecified: Secondary | ICD-10-CM | POA: Diagnosis not present

## 2011-08-15 DIAGNOSIS — Z79899 Other long term (current) drug therapy: Secondary | ICD-10-CM

## 2011-08-15 MED ORDER — RISPERIDONE 0.25 MG PO TABS: 0.2500 mg | ORAL_TABLET | Freq: Every day | ORAL | Status: DC

## 2011-08-15 MED ORDER — ESCITALOPRAM OXALATE 20 MG PO TABS: 20.0000 mg | ORAL_TABLET | Freq: Every day | ORAL | Status: DC

## 2011-08-15 NOTE — Progress Notes (Signed)
Patient came for his followup appointment. He has a good Christmas with his sister and granddaughter. He is been compliant with his medication and reported no side effects. I have noticed he has gained weight in past few months. He has not seen his primary care physician and stopped taking is cholesterol medicine. He's been sleeping okay though he complained of decreased energy at times. He denies any crying spells or any agitation. He denies any anger or mood swings. His been socializing well.  Mental status examination Patient is calm cooperative and pleasant. He appears tired and has been noticed more obese. His speech is soft clear and coherent. He is pleasant and maintained good eye contact. He denies any active or passive suicidal thoughts or homicidal thoughts. There no psychotic symptoms present. He described his mood is tired and his affect is mood congruent. He's alert and oriented x3. His insight judgment and impulse control is okay.  Assessment Maj. depressive disorder  Plan I recommended to cut down his Risperdal to 0.25 mg as he does not have any psychotic symptoms at this time. I will continue his Lexapro. I encourage him to see primary care physician. I will also do blood work including CBC CMP and hemoglobin A1c. I will see him again in 6 weeks. [facility] for Risperdal 0.25 and Lexapro 20 mg.

## 2011-08-16 DIAGNOSIS — F329 Major depressive disorder, single episode, unspecified: Secondary | ICD-10-CM | POA: Diagnosis not present

## 2011-08-16 LAB — COMPREHENSIVE METABOLIC PANEL
ALT: 11 U/L (ref 0–53)
AST: 12 U/L (ref 0–37)
Albumin: 4.2 g/dL (ref 3.5–5.2)
Alkaline Phosphatase: 117 U/L (ref 39–117)
BUN: 18 mg/dL (ref 6–23)
CO2: 24 mEq/L (ref 19–32)
Calcium: 10.1 mg/dL (ref 8.4–10.5)
Chloride: 103 mEq/L (ref 96–112)
Creat: 1.41 mg/dL — ABNORMAL HIGH (ref 0.50–1.35)
Glucose, Bld: 99 mg/dL (ref 70–99)
Potassium: 4.7 mEq/L (ref 3.5–5.3)
Sodium: 136 mEq/L (ref 135–145)
Total Bilirubin: 0.5 mg/dL (ref 0.3–1.2)
Total Protein: 7.1 g/dL (ref 6.0–8.3)

## 2011-08-16 LAB — CBC
HCT: 46.4 % (ref 39.0–52.0)
Hemoglobin: 15.2 g/dL (ref 13.0–17.0)
MCH: 29.6 pg (ref 26.0–34.0)
MCHC: 32.8 g/dL (ref 30.0–36.0)
MCV: 90.3 fL (ref 78.0–100.0)
Platelets: 169 K/uL (ref 150–400)
RBC: 5.14 MIL/uL (ref 4.22–5.81)
RDW: 13.4 % (ref 11.5–15.5)
WBC: 6.4 K/uL (ref 4.0–10.5)

## 2011-08-26 ENCOUNTER — Encounter (HOSPITAL_COMMUNITY): Payer: Self-pay | Admitting: Licensed Clinical Social Worker

## 2011-08-26 ENCOUNTER — Ambulatory Visit (INDEPENDENT_AMBULATORY_CARE_PROVIDER_SITE_OTHER): Payer: Medicare Other | Admitting: Licensed Clinical Social Worker

## 2011-08-26 ENCOUNTER — Other Ambulatory Visit (HOSPITAL_COMMUNITY): Payer: Self-pay | Admitting: Psychiatry

## 2011-08-26 DIAGNOSIS — F319 Bipolar disorder, unspecified: Secondary | ICD-10-CM | POA: Diagnosis not present

## 2011-08-26 NOTE — Progress Notes (Signed)
   THERAPIST PROGRESS NOTE  Session Time: 11:30am-12:20pm  Participation Level: Active  Behavioral Response: Well GroomedAlertEuthymic  Type of Therapy: Individual Therapy  Treatment Goals addressed: Coping  Interventions: CBT, Motivational Interviewing and Strength-based  Summary: Fernando Carr is a 71 y.o. male who presents with euthymic mood and affect. This is patients first session since October, since this therapist was out on leave. Patient reports that he has been doing well, with well controlled mood stability, well managed anxiety and an absence of any depression. He does express frustration with his co dependent patterns and his continued pattern of rescuing his neighbor. He is currently paying for her to stay in a nice hotel, as well as pay for her rent at a new apartment. He is also frustrated with how much he is helping his daughter financially. He reminds active and is involved caring for his granddaughter. He is not exercising and continues to gain weight. He lacks motivation to make life style changes. His sleep is wnl and his appetite is increased.    Suicidal/Homicidal: Nowithout intent/plan  Therapist Response: Overall, patients mood stability and strategies to manage his mood are good. His primary problem remains his inability to set firm and healthy limits involving rescuing his neighbor and his daughter. Processed his frustration with himself, his embarrassment that he has run out of money this month because he is unable to say no. Processed negative consequences he experiences as a result of this behavior. Used CBT to assist patient with the identification of his negative distortions and irrational thoughts. Encouraged patient to verbalize alternative and factual responses which challenge his distortions. Used motivational interviewing to assist and encourage patient through the change process. Explored patients barriers to change. Reviewed patients self care plan. Assessed  his progress related to self care. Patient's self care is fair. Recommend proper diet, regular exercise, socialization and recreation.   Plan: Return again in 8 weeks.  Diagnosis: Axis I: Bipolar, Manic    Axis II: Deferred    Olayinka Gathers, LCSW 08/26/2011

## 2011-09-20 ENCOUNTER — Other Ambulatory Visit (HOSPITAL_COMMUNITY): Payer: Self-pay | Admitting: Psychiatry

## 2011-09-21 ENCOUNTER — Other Ambulatory Visit (HOSPITAL_COMMUNITY): Payer: Self-pay | Admitting: *Deleted

## 2011-09-29 DIAGNOSIS — R7989 Other specified abnormal findings of blood chemistry: Secondary | ICD-10-CM | POA: Diagnosis not present

## 2011-09-29 DIAGNOSIS — F17200 Nicotine dependence, unspecified, uncomplicated: Secondary | ICD-10-CM | POA: Diagnosis not present

## 2011-09-29 DIAGNOSIS — E559 Vitamin D deficiency, unspecified: Secondary | ICD-10-CM | POA: Diagnosis not present

## 2011-09-29 DIAGNOSIS — G4700 Insomnia, unspecified: Secondary | ICD-10-CM | POA: Diagnosis not present

## 2011-09-29 DIAGNOSIS — E785 Hyperlipidemia, unspecified: Secondary | ICD-10-CM | POA: Diagnosis not present

## 2011-09-29 DIAGNOSIS — F329 Major depressive disorder, single episode, unspecified: Secondary | ICD-10-CM | POA: Diagnosis not present

## 2011-10-03 DIAGNOSIS — E781 Pure hyperglyceridemia: Secondary | ICD-10-CM | POA: Insufficient documentation

## 2011-10-10 ENCOUNTER — Encounter (HOSPITAL_COMMUNITY): Payer: Self-pay | Admitting: Psychiatry

## 2011-10-10 ENCOUNTER — Ambulatory Visit (INDEPENDENT_AMBULATORY_CARE_PROVIDER_SITE_OTHER): Payer: Medicare Other | Admitting: Psychiatry

## 2011-10-10 DIAGNOSIS — F32A Depression, unspecified: Secondary | ICD-10-CM | POA: Insufficient documentation

## 2011-10-10 DIAGNOSIS — E785 Hyperlipidemia, unspecified: Secondary | ICD-10-CM

## 2011-10-10 DIAGNOSIS — F329 Major depressive disorder, single episode, unspecified: Secondary | ICD-10-CM

## 2011-10-10 NOTE — Progress Notes (Signed)
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 Patient has been compliant with his medication. He is recently seen his primary care physician also added fenofibrate to lower cholesterol. Overall patient has been stable on his medication. He denies any agitation anger or mood swings. He denies any crying spells. He sleeps 6-7 hours. He continued to have issues with her daughter who is now thinking to move out from [place]. Patient does not want to leave this area. His been seeing therapist regularly. On last visit we have reduced his Risperdal to 0.25 mg. Patient does not see any big change. He is concerned about his weight gain. He admitted not swimming in recent months. Denies any alcohol or using any illegal substances.  Past psychiatric history Patient has at least one psychiatric inpatient treatment in 2006 detail suicidal attempt. He injected Ethyline Glycol. 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 He was feeling hopeless helpless and having paranoid thoughts. He had lost property and having significant financial burden.  Medical history Hyperlipidemia, obesity  Alcohol and substance use history The patient has history of alcohol use in the past. He has 2 DWI was suspended driver's license. Patient has been sober in recent months.  Psychosocial history Patient born and grew up in [place]. 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 He has one daughter who lives in [place].  Family history Patient has a history of family psychiatric illness  Mental status examination Patient is casually dressed and fairly groomed. He is pleasant cooperative and maintained good eye contact. His speech is clear and coherent. His thought process logical linear and goal-directed. He denies any active or passive suicidal thoughts or homicidal thoughts. He denies  any auditory or visual hallucination. There no psychotic symptoms present at this time. He described his mood is okay and his affect is mood congruent. He's alert and oriented x3. His insight judgment and impulse control is okay.  Assessment Axis I Maj. Depressive disorder with psychotic features Axis II deferred Axis III see medical history Axis IV 1 to moderate  Plan I will continue Risperdal 0.25 mg at bedtime along with Lexapro 20 mg daily. He has seen recently primary care physician. I have discussed his lab including high creatinine level. Patient is aware about his blood results. He will continue to followup with his primary care physician. I have explained risks and benefits of medication. We will consider taking him off from Risperdal on his next visit. I will see him again in 3 months. As per patient he has given or psychiatric medication recently from his primary care physician. Time spent 30 minutes

## 2011-10-24 ENCOUNTER — Ambulatory Visit (INDEPENDENT_AMBULATORY_CARE_PROVIDER_SITE_OTHER): Payer: Medicare Other | Admitting: Licensed Clinical Social Worker

## 2011-10-24 ENCOUNTER — Encounter (HOSPITAL_COMMUNITY): Payer: Self-pay | Admitting: Licensed Clinical Social Worker

## 2011-10-24 ENCOUNTER — Other Ambulatory Visit (HOSPITAL_COMMUNITY): Payer: Self-pay | Admitting: *Deleted

## 2011-10-24 DIAGNOSIS — F319 Bipolar disorder, unspecified: Secondary | ICD-10-CM | POA: Insufficient documentation

## 2011-10-24 NOTE — Progress Notes (Signed)
   THERAPIST PROGRESS NOTE  Session Time: 10:30am-11:20am  Participation Level: Active  Behavioral Response: DisheveledAlertEuthymic  Type of Therapy: Individual Therapy  Treatment Goals addressed: Anxiety and Coping  Interventions: CBT, Motivational Interviewing, Supportive and Reframing  Summary: Fernando Carr is a 71 y.o. male who presents with euthymic mood and affect. He reports doing well over the past two months,with well controlled mood stability. He denies any depression or mania and is pleased that his moods are stable. He does endorse anxiety and irritability related to his codependent behavior patterns and states that he is "stupid" for continuing to support people who take advantage of him. He describes giving continued financial support to his neighbor which he estimates costs him $1,000 dollars per month. He is also supporting his daughter as well. When asked if he wants to continue this support, he says no, but states that he feels guilty otherwise. He is able to enjoy taking care of his granddaughter and considers spending that $1,000 on her for a college fund. His sleep is excellent and his appetite is wnl, but he eats poorly.    Suicidal/Homicidal: Nowithout intent/plan  Therapist Response: Processed w/pt his frustrations with himself and with those whom he supports. Examined his distorted thought processes. Used CBT to assist patient with the identification of his negative distortions and irrational thoughts. Encouraged patient to verbalize alternative and factual responses which challenge his distortions. Explored the consequences he experiences from his continued rescuing efforts. Discussed his health, encouraged patient to eat better and to focus on harm reduction for smoking. Used motivational interviewing to assist and encourage patient through the change process. Explored patients barriers to change. Reviewed patients self care plan. Assessed his progress related to self  care. Patient's self care is poor. Recommend proper diet, regular exercise, socialization and recreation.   Plan: Return again in two months.  Diagnosis: Axis I: bi-polar disorder    Axis II: No diagnosis    Brie Eppard, LCSW 10/24/2011

## 2011-10-26 ENCOUNTER — Other Ambulatory Visit (HOSPITAL_COMMUNITY): Payer: Self-pay | Admitting: *Deleted

## 2011-10-26 DIAGNOSIS — F329 Major depressive disorder, single episode, unspecified: Secondary | ICD-10-CM

## 2011-10-26 MED ORDER — RISPERIDONE 0.25 MG PO TABS: 0.2500 mg | ORAL_TABLET | Freq: Every day | ORAL | Status: DC

## 2011-11-03 DIAGNOSIS — R748 Abnormal levels of other serum enzymes: Secondary | ICD-10-CM | POA: Diagnosis not present

## 2011-11-03 DIAGNOSIS — E781 Pure hyperglyceridemia: Secondary | ICD-10-CM | POA: Diagnosis not present

## 2011-11-03 DIAGNOSIS — R7989 Other specified abnormal findings of blood chemistry: Secondary | ICD-10-CM | POA: Diagnosis not present

## 2011-11-03 DIAGNOSIS — E785 Hyperlipidemia, unspecified: Secondary | ICD-10-CM | POA: Diagnosis not present

## 2011-11-08 ENCOUNTER — Ambulatory Visit: Payer: Medicare Other | Admitting: Vascular Surgery

## 2011-11-09 DIAGNOSIS — H35729 Serous detachment of retinal pigment epithelium, unspecified eye: Secondary | ICD-10-CM | POA: Diagnosis not present

## 2011-11-09 DIAGNOSIS — H35049 Retinal micro-aneurysms, unspecified, unspecified eye: Secondary | ICD-10-CM | POA: Diagnosis not present

## 2011-11-09 DIAGNOSIS — H43399 Other vitreous opacities, unspecified eye: Secondary | ICD-10-CM | POA: Diagnosis not present

## 2011-11-09 DIAGNOSIS — H02839 Dermatochalasis of unspecified eye, unspecified eyelid: Secondary | ICD-10-CM | POA: Diagnosis not present

## 2011-11-17 DIAGNOSIS — R7989 Other specified abnormal findings of blood chemistry: Secondary | ICD-10-CM | POA: Diagnosis not present

## 2011-12-06 ENCOUNTER — Encounter: Payer: Self-pay | Admitting: Neurosurgery

## 2011-12-07 ENCOUNTER — Encounter: Payer: Self-pay | Admitting: Neurosurgery

## 2011-12-13 DIAGNOSIS — E785 Hyperlipidemia, unspecified: Secondary | ICD-10-CM | POA: Diagnosis not present

## 2011-12-19 ENCOUNTER — Encounter: Payer: Self-pay | Admitting: Neurosurgery

## 2011-12-20 ENCOUNTER — Encounter: Payer: Self-pay | Admitting: Neurosurgery

## 2011-12-20 ENCOUNTER — Ambulatory Visit (INDEPENDENT_AMBULATORY_CARE_PROVIDER_SITE_OTHER): Payer: Medicare Other | Admitting: Neurosurgery

## 2011-12-20 ENCOUNTER — Ambulatory Visit (INDEPENDENT_AMBULATORY_CARE_PROVIDER_SITE_OTHER): Payer: Medicare Other

## 2011-12-20 VITALS — BP 122/79 | HR 55 | Resp 16 | Ht 72.0 in | Wt 264.9 lb

## 2011-12-20 DIAGNOSIS — I714 Abdominal aortic aneurysm, without rupture: Secondary | ICD-10-CM

## 2011-12-20 NOTE — Progress Notes (Signed)
[facility] HISTORY AND PHYSICAL   CC: AAA duplex surveillance Referring Physician: Early  History of Present Illness: 71 year old male patient of Dr. Arbie Cookey followed for known AAA. Patient reports no abdominal pain or back pain. He has had no new medical diagnoses or no recent surgeries.  Past Medical History  Diagnosis Date  . High cholesterol   . Bipolar disorder   . AAA (abdominal aortic aneurysm)   . Depression   . Renal insufficiency     chronic renal   . Suicide attempt aug. 2006    with acute dialysis from Ethylene glycol poisoning    ROS: [x]  Positive   [ ]  Denies    General: [ ]  Weight loss, [ ]  Fever, [ ]  chills Neurologic: [ ]  Dizziness, [ ]  Blackouts, [ ]  Seizure [ ]  Stroke, [ ]  "Mini stroke", [ ]  Slurred speech, [ ]  Temporary blindness; [ ]  weakness in arms or legs, [ ]  Hoarseness Cardiac: [ ]  Chest pain/pressure, [ ]  Shortness of breath at rest [ ]  Shortness of breath with exertion, [ ]  Atrial fibrillation or irregular heartbeat Vascular: [ ]  Pain in legs with walking, [ ]  Pain in legs at rest, [ ]  Pain in legs at night,  [ ]  Non-healing ulcer, [ ]  Blood clot in vein/DVT,   Pulmonary: [ ]  Home oxygen, [ ]  Productive cough, [ ]  Coughing up blood, [ ]  Asthma,  [ ]  Wheezing Musculoskeletal:  [ ]  Arthritis, [ ]  Low back pain, [ ]  Joint pain Hematologic: [ ]  Easy Bruising, [ ]  Anemia; [ ]  Hepatitis Gastrointestinal: [ ]  Blood in stool, [ ]  Gastroesophageal Reflux/heartburn, [ ]  Trouble swallowing Urinary: [ ]  chronic Kidney disease, [ ]  on HD - [ ]  MWF or [ ]  TTHS, [ ]  Burning with urination, [ ]  Difficulty urinating Skin: [ ]  Rashes, [ ]  Wounds Psychological: [ ]  Anxiety, [ ]  Depression   Social History History  Substance Use Topics  . Smoking status: Current Everyday Smoker -- 1.0 packs/day for 40 years    Types: Cigarettes  . Smokeless tobacco: Not on file  . Alcohol Use: No    Family History Family History  Problem Relation  Age of Onset  . Alcohol abuse Mother   . Alcohol abuse Father   . Suicidality Paternal Uncle   . Suicidality Cousin   . Depression Daughter     No Known Allergies  Current Outpatient Prescriptions  Medication Sig Dispense Refill  . escitalopram (LEXAPRO) 20 MG tablet Take 1 tablet (20 mg total) by mouth daily.  90 tablet  0  . fenofibrate micronized (LOFIBRA) 67 MG capsule       . risperiDONE (RISPERDAL) 0.25 MG tablet Take 1 tablet (0.25 mg total) by mouth at bedtime.  90 tablet  0  . simvastatin (ZOCOR) 20 MG tablet Take 10 mg by mouth daily.       . Vitamin D, Ergocalciferol, (DRISDOL) 50000 UNITS CAPS         Physical Examination  Filed Vitals:   12/20/11 1018  BP: 122/79  Pulse: 55  Resp: 16    Body mass index is 35.93 kg/(m^2).  General:  WDWN in NAD Gait: Normal HEENT: WNL Eyes: Pupils equal Pulmonary: normal non-labored breathing , without Rales, rhonchi,  wheezing Cardiac: RRR, without  Murmurs, rubs or gallops; No carotid bruits Abdomen: soft, NT, no masses Skin: no rashes, ulcers noted Vascular Exam/Pulses: Patient has 2+ radial pulses, 2+ femoral pulses and palpable PT  and DP pulses bilaterally, I cannot palpate an abdominal aneurysm due to his girth.  Extremities without ischemic changes, no Gangrene , no cellulitis; no open wounds;  Musculoskeletal: no muscle wasting or atrophy  Neurologic: A&O X 3; Appropriate Affect ; SENSATION: normal; MOTOR FUNCTION:  moving all extremities equally. Speech is fluent/normal  Non-Invasive Vascular Imaging: AAA duplex today shows a distal AP of 4.07, transverse 4.11 which is slight increase from last year.  ASSESSMENT/PLAN: Patient with asymptomatic AAA that has slightly increased in one year. He will followup here in one year with repeat AAA duplex and be seen in my clinic. His questions were encouraged and answered, he is in agreement with this plan.  Lauree Chandler ANP  Clinic M.D.: Early

## 2011-12-21 NOTE — Progress Notes (Signed)
Addended by: Sharee Pimple on: 12/21/2011 09:42 AM   Modules accepted: Orders

## 2011-12-23 NOTE — Procedures (Unsigned)
DUPLEX ULTRASOUND OF ABDOMINAL AORTA  INDICATION:  Reevaluation of known abdominal aortic aneurysm.  HISTORY: Diabetes:  No. Cardiac:  No. Hypertension:  No. Smoking:  Yes. Connective Tissue Disorder: Family History:  No. Previous Surgery:  No.  DUPLEX EXAM:         AP (cm)                   TRANSVERSE (cm) Proximal             1.85 cm                   2.11 cm Mid                  2.43 cm Distal               4.07 cm                   4.11 cm Right Iliac          1.22 cm                   1.13 cm Left Iliac           1.3 cm                    1.43 cm  PREVIOUS:  Date: 11/09/2010  AP:  3.5  TRANSVERSE:  3.5.  DUPLEX IMAGING:  Soft thrombus is noted within the abdominal aortic aneurysm.  There is calcified plaque in the proximal common  iliac artery bilaterally.  Please note that this is a technically difficult exam secondary to the patient's size and the presence of bowel gas.  IMPRESSION: 1. Abdominal aortic aneurysm measuring 4.07 cm AP x 4.11 cm     transverse.  Note:  This exam represents an increase in the size of     the aneurysm since the previous exam performed on 11/09/2010.      ___________________________________________ Larina Earthly, M.D.  CI/MEDQ  D:  12/20/2011  T:  12/20/2011  Job:  [id number]

## 2011-12-27 ENCOUNTER — Ambulatory Visit (INDEPENDENT_AMBULATORY_CARE_PROVIDER_SITE_OTHER): Payer: Medicare Other | Admitting: Licensed Clinical Social Worker

## 2011-12-27 DIAGNOSIS — F319 Bipolar disorder, unspecified: Secondary | ICD-10-CM

## 2011-12-27 NOTE — Progress Notes (Signed)
   THERAPIST PROGRESS NOTE  Session Time: 9:30am-10:20am  Participation Level: Active  Behavioral Response: Fairly GroomedAlertEuthymic  Type of Therapy: Individual Therapy  Treatment Goals addressed: Coping  Interventions: CBT, Motivational Interviewing and Solution Focused  Summary: Fernando Carr is a 71 y.o. male who presents with euthymic mood and bright affect. He reports doing well with an absence of depression. He does endorse hypomania and has purchased another car. Other than that impulsive purchase, he denies any other purchases. He felt pressured into purchasing another vehicle by his daughter. He discusses his frustration with his daughter and her unrealistic expectations of him and his finances. He is frustrated with himself, but laughs it off. He continues to support two other women by giving them vehicles to drive. One has not paid any car payments or insurance payments. He wants to take the car back, but has not done so. He reports that he will see a kidney doctor, but does not know why. His sleep and appetite are both wnl.   Suicidal/Homicidal: Nowithout intent/plan  Therapist Response: Reviewed patients progress and assessed his current functioning. Discussed his recent car purchase, which patient inappropriately laughs about. His judgment about the seriousness of this action is poor. Processed and explored the consequences of this decision. His speech rate and volume are normal. He is able to be redirected, but laughs about the problems he has created for himself. His thinking is clear and coherent. Used CBT to assist patient with the identification of his negative distortions and irrational thoughts. Encouraged patient to verbalize alternative and factual responses which challenge his distortions. Reviewed and updated patients treatment plan to focus on staying healthy and setting limits. Used motivational interviewing to assist and encourage patient through the change process.  Explored patients barriers to change. Reviewed patients self care plan. Assessed his progress related to self care. Patient's self care is his. Recommend proper diet, regular exercise, socialization and recreation.    Plan: Return again in four weeks.  Diagnosis: Axis I: bi-polar     Axis II: No diagnosis    Jameer Storie, LCSW 12/27/2011

## 2012-01-11 ENCOUNTER — Ambulatory Visit (INDEPENDENT_AMBULATORY_CARE_PROVIDER_SITE_OTHER): Payer: Medicare Other | Admitting: Psychiatry

## 2012-01-11 ENCOUNTER — Encounter (HOSPITAL_COMMUNITY): Payer: Self-pay | Admitting: Psychiatry

## 2012-01-11 VITALS — BP 123/68 | HR 65 | Ht 72.0 in | Wt 267.0 lb

## 2012-01-11 DIAGNOSIS — F323 Major depressive disorder, single episode, severe with psychotic features: Secondary | ICD-10-CM

## 2012-01-11 DIAGNOSIS — F329 Major depressive disorder, single episode, unspecified: Secondary | ICD-10-CM

## 2012-01-11 MED ORDER — ESCITALOPRAM OXALATE 20 MG PO TABS
20.0000 mg | ORAL_TABLET | Freq: Every day | ORAL | Status: DC
Start: 2012-01-11 — End: 2012-04-09

## 2012-01-11 MED ORDER — RISPERIDONE 0.25 MG PO TABS: 0.2500 mg | ORAL_TABLET | Freq: Every day | ORAL | Status: DC

## 2012-01-11 NOTE — Progress Notes (Signed)
Chief complaint Medication management and followup.  History of present illness Patient is 71 year old Caucasian man who came for her followup appointment.  Patient has been compliant with her psychiatric medication.  He is taking Risperdal 0.25 mg at bedtime.  His Risperdal dose has been decreased gradually.  Patient does not endorse any paranoia or hallucination.  He is sleeping better.  He denies any recent trying spells agitation anger mood swing.  He is concerned about his daughter who recently moved from [facility] to [facility].  Patient endorse that sometime his daughter does not make that decision.  Overall patient has been stable on his current psychiatric medication.  He is concerned about his weight gain and actually gained 3 pounds from his last visit.  His creatinine was 1.41 and he is scheduled to see his primary care physician again on August 14.  He is sleeping 7 hours.  Patient is seeing therapist regularly for coping and social skills.  He denies drinking alcohol or using any illegal substance.  He denies any tremors or shakes.  Current psychiatric medication Lexapro 20 mg daily Risperdal 0.25 mg at bedtime  Past psychiatric history Patient has at least one psychiatric inpatient treatment in 2006 detail suicidal attempt. He injected Ethyline Glycol. 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 He was feeling hopeless helpless and having paranoid thoughts. He had lost property and having significant financial burden.  Medical history Hyperlipidemia and obesity.  He see Dr. Lanora Manis at [facility].   Alcohol and substance use history The patient has history of alcohol use in the past. He has 2 DWI was suspended driver's license. Patient has been sober in recent months.  Psychosocial history Patient born and grew up in [place]. 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 He has one daughter who lives in [place].  Family  history Patient has a history of family psychiatric illness  Mental status examination Patient is casually dressed and fairly groomed. He he appears overweight from his last visit.  He is pleasant cooperative and maintained good eye contact. His speech is clear and coherent. His thought process logical linear and goal-directed. He denies any active or passive suicidal thoughts or homicidal thoughts. He denies any auditory or visual hallucination. There no psychotic symptoms present at this time. He described his mood is okay and his affect is mood congruent. He's alert and oriented x3. His insight judgment and impulse control is okay.  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  I encourage him to see his therapist regularly for coping and social skills.  I will continue his current psychiatric medication .  At this time patient does not have any side effects.  I recommend to call us if he is in a question or concern about the medication or if he feel worsening of the symptoms.  I will see him again in 3 months.  Time spent 30 minutes.  Portion of this note is generated with voice recognition software and may contain typographical error.

## 2012-01-25 ENCOUNTER — Ambulatory Visit (INDEPENDENT_AMBULATORY_CARE_PROVIDER_SITE_OTHER): Payer: Medicare Other | Admitting: Licensed Clinical Social Worker

## 2012-01-25 DIAGNOSIS — F319 Bipolar disorder, unspecified: Secondary | ICD-10-CM

## 2012-01-25 NOTE — Progress Notes (Signed)
   THERAPIST PROGRESS NOTE  Session Time: 10:40am-11:30am  Participation Level: Active  Behavioral Response: Well GroomedAlertEuthymic  Type of Therapy: Individual Therapy  Treatment Goals addressed: Coping  Interventions: CBT, Motivational Interviewing and Solution Focused  Summary: Fernando Carr is a 71 y.o. male who presents with euthymic mood and bright affect. He is late for his appointment because he forgot about it. He reports hypomanic symptoms have decreased and he has begun setting firmer limits with people in his life who take advantage of him. He is debt and wants to pay off his debt instead of rescuing others. He has followed through on goals he has set, but has been fearful of the potential conflict. He cashed in stock and has a "pile of money in the bank" which he is trying not to spend impulsively. He denies any large purchases since his last session. His sleep and appetite are wnl.   Suicidal/Homicidal: Nowithout intent/plan  Therapist Response: Processed w/pt his feelings and reaction related to setting boundaries. He is slowly developing improved tolerance for conflict and demonstrates slow improvement in his judgment regarding finances and allowing others to take advantage of him. Used CBT to assist patient with the identification of his negative distortions and irrational thoughts. Encouraged patient to verbalize alternative and factual responses which challenge his distortions. Used motivational interviewing to assist and encourage patient through the change process. Explored patients barriers to change. Reviewed patients self care plan. Assessed his progress related to self care. Patient's self care is fair. Recommend proper diet, regular exercise, socialization and recreation.   Plan: Return again in 10 weeks.  Diagnosis: Axis I: bi polar    Axis II: No diagnosis    Zyionna Pesce, LCSW 01/25/2012

## 2012-04-04 DIAGNOSIS — N289 Disorder of kidney and ureter, unspecified: Secondary | ICD-10-CM | POA: Diagnosis not present

## 2012-04-04 DIAGNOSIS — F339 Major depressive disorder, recurrent, unspecified: Secondary | ICD-10-CM | POA: Diagnosis not present

## 2012-04-04 DIAGNOSIS — E785 Hyperlipidemia, unspecified: Secondary | ICD-10-CM | POA: Diagnosis not present

## 2012-04-04 DIAGNOSIS — Z23 Encounter for immunization: Secondary | ICD-10-CM | POA: Diagnosis not present

## 2012-04-04 DIAGNOSIS — N183 Chronic kidney disease, stage 3 unspecified: Secondary | ICD-10-CM | POA: Diagnosis not present

## 2012-04-05 DIAGNOSIS — N289 Disorder of kidney and ureter, unspecified: Secondary | ICD-10-CM | POA: Diagnosis not present

## 2012-04-05 DIAGNOSIS — N281 Cyst of kidney, acquired: Secondary | ICD-10-CM | POA: Diagnosis not present

## 2012-04-09 ENCOUNTER — Encounter (HOSPITAL_COMMUNITY): Payer: Self-pay | Admitting: Psychiatry

## 2012-04-09 ENCOUNTER — Ambulatory Visit (INDEPENDENT_AMBULATORY_CARE_PROVIDER_SITE_OTHER): Payer: Medicare Other | Admitting: Psychiatry

## 2012-04-09 VITALS — BP 117/56 | HR 66 | Wt 270.2 lb

## 2012-04-09 DIAGNOSIS — F329 Major depressive disorder, single episode, unspecified: Secondary | ICD-10-CM

## 2012-04-09 DIAGNOSIS — F323 Major depressive disorder, single episode, severe with psychotic features: Secondary | ICD-10-CM

## 2012-04-09 MED ORDER — ESCITALOPRAM OXALATE 20 MG PO TABS: 20.0000 mg | ORAL_TABLET | Freq: Every day | ORAL | Status: DC

## 2012-04-09 NOTE — Progress Notes (Signed)
Chief complaint I'm doing good on the medication.    History of present illness Patient is 71 year old Caucasian man who came for her followup appointment.  He has been compliant with his medication and reported no side effects.  He stopped taking his Risperdal .  He denies any paranoia and hallucination or any agitation.  He likes his Lexapro .  He denies any recent crying spells .  He has seen his primary care physician however no new medication added.  Remember his cholesterol is 206 .  However we do not have the results.  He is worried about his daughter who is now living in [place].  Patient is seeing therapist regularly.  He's concerned about his weight which has been not lost despite he is trying to eat healthy diet and go for walking 2- 3 times a day.  He is sleeping 6 hours without any interruption.  He feels he does not need rest about.  He denies any tremors or shakes.  Current psychiatric medication Lexapro 20 mg daily  Past psychiatric history Patient has at least one psychiatric inpatient treatment in 2006 detail suicidal attempt. He injected Ethyline Glycol. 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 He was feeling hopeless helpless and having paranoid thoughts. He had lost property and having significant financial burden.  Medical history Hyperlipidemia and obesity.  He see Dr. Lanora Manis at [facility].   Alcohol and substance use history The patient has history of alcohol use in the past. He has 2 DWI and his license was suspended. Patient has been sober in recent months.  Psychosocial history Patient born and grew up in [place]. 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 He has one daughter who lives in [place].  Patient is still has property in [place].  Family history Patient denies any history of family psychiatric illness  Mental status examination Patient is casually dressed and fairly groomed. He is pleasant  cooperative and maintained good eye contact. His speech is clear and coherent. His thought process logical linear and goal-directed. He denies any active or passive suicidal thoughts or homicidal thoughts. He denies any auditory or visual hallucination. There no psychotic symptoms present at this time. He described his mood is okay and his affect is mood congruent. He's alert and oriented x3. His insight judgment and impulse control is okay.  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  At this time he is not taking Risperdal and does not have any psychotic symptoms.  I recommend to continue his Lexapro and keep appointment with the therapist.  I recommend if he started to feel paranoia or any hallucination then he should start taking Risperdal.  I explained risks and benefits of medication and recommended to call us if he has any question or concern if he feel worsening of the symptom.  Time spent 30 minutes.  I will see him again in 3 months.  Portion of this note is generated with voice recognition software and may contain typographical error.

## 2012-04-16 ENCOUNTER — Ambulatory Visit (INDEPENDENT_AMBULATORY_CARE_PROVIDER_SITE_OTHER): Payer: Medicare Other | Admitting: Licensed Clinical Social Worker

## 2012-04-16 ENCOUNTER — Encounter (HOSPITAL_COMMUNITY): Payer: Self-pay | Admitting: Licensed Clinical Social Worker

## 2012-04-16 DIAGNOSIS — F319 Bipolar disorder, unspecified: Secondary | ICD-10-CM

## 2012-04-16 NOTE — Progress Notes (Signed)
   THERAPIST PROGRESS NOTE  Session Time: 10:30am-11:20am  Participation Level: Active  Behavioral Response: Well GroomedAlertAnxious  Type of Therapy: Individual Therapy  Treatment Goals addressed: Coping  Interventions: CBT, Motivational Interviewing, Supportive and Reframing  Summary: Fernando Carr is a 71 y.o. male who presents with anxious mood and frustrated affect. He reports that his moods have been stable, but recently he is experiencing increased anxiety due to helping too many people and spending too much money on them. He feels taken advantage of and these are two women he has historically provided for and who he has great difficulty setting limits with. He is angry with himself for "falling" for their manipulative techniques and for believing that they can change. He recently cashed out a stock for $25,000 and has already spent over half of this on these women and his daughter. He know he needs to set limits but continues to struggle with this long standing problem. He has gained two more pounds and has not lost weight since stopping Seroquel. His exercise is quite limited to walking to his dog and he knows he needs to increase his activity level. His sleep and appetite are wnl.   Suicidal/Homicidal: Nowithout intent/plan  Therapist Response: Assessed patients current functioning and reviewed progress. Reviewed coping strategies. Assessed patients safety and assisted in identifying protective factors.  Reviewed crisis plan with patient.Assisted patient with the expression of his feelings of frustration and anxiety. Discussed and reviewed healthy boundaries. Used motivational interviewing to assist and encourage patient through the change process. Explored patients barriers to change. Patients thinking is negative and irrational at times. He needs redirection back to focus on how he can implement control over his own life. Used CBT to assist patient with the identification of negative  distortions and irrational thoughts. Encouraged patient to verbalize alternative and factual responses which challenge thought distortions. Reviewed patients self care plan. Assessed  progress related to self care. Patient's self care is fair. Recommend proper diet, regular exercise, socialization and recreation.   Plan: Return again in 10 weeks.  Diagnosis: Axis I: bi polar disorder    Axis II: No diagnosis    Aldred Mase, LCSW 04/16/2012

## 2012-04-23 ENCOUNTER — Encounter: Payer: Self-pay | Admitting: Vascular Surgery

## 2012-04-24 ENCOUNTER — Ambulatory Visit (INDEPENDENT_AMBULATORY_CARE_PROVIDER_SITE_OTHER): Payer: Medicare Other | Admitting: Vascular Surgery

## 2012-04-24 ENCOUNTER — Encounter: Payer: Self-pay | Admitting: Vascular Surgery

## 2012-04-24 VITALS — BP 144/71 | HR 63 | Ht 72.0 in | Wt 269.5 lb

## 2012-04-24 DIAGNOSIS — I714 Abdominal aortic aneurysm, without rupture: Secondary | ICD-10-CM

## 2012-04-24 NOTE — Progress Notes (Signed)
[facility] [place] HISTORY AND PHYSICAL   History of Present Illness: fThis is a 71 year old male in for f/u of his AAA.  He was last seen in May 2013 with an ultrasound measurement of AP 4.07 and transverse of 4.11.  He was seen by Dr. Duanne Guess due to kidney insufficiency when they saw the aortic finding and he was sent here for follow exam.  He reports no new problems since his last visit.   No back pain or abdominal pain.       Other medical problems include   Past Medical History  Diagnosis Date  . High cholesterol   . Bipolar disorder   . AAA (abdominal aortic aneurysm)   . Depression   . Renal insufficiency     chronic renal   . Suicide attempt aug. 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 Knee arthroscopy      Social History History  Substance Use Topics  . Smoking status: Current Every Day Smoker -- 1.0 packs/day for 40 years    Types: Cigarettes  . Smokeless tobacco: Never Used  . Alcohol Use: No    Family History Family History  Problem Relation Age of Onset  . Alcohol abuse Mother   . Alcohol abuse Father   . Suicidality Paternal Uncle   . Suicidality Cousin   . Depression Daughter     Allergies  No Known Allergies   Current Outpatient Prescriptions  Medication Sig Dispense Refill  . escitalopram (LEXAPRO) 20 MG tablet Take 1 tablet (20 mg total) by mouth daily.  90 tablet  0  . fenofibrate micronized (LOFIBRA) 67 MG capsule       . simvastatin (ZOCOR) 20 MG tablet Take 10 mg by mouth daily.       . Vitamin D, Ergocalciferol, (DRISDOL) 50000 UNITS CAPS         ROS:   General:  No weight loss, Fever, chills  HEENT: No recent headaches, no nasal bleeding, no visual changes, no sore throat  Neurologic: No dizziness, blackouts, seizures. No recent symptoms of stroke or mini- stroke. No recent episodes of slurred speech, or temporary blindness.  Cardiac: No recent  episodes of chest pain/pressure, no shortness of breath at rest.  No shortness of breath with exertion.  Denies history of atrial fibrillation or irregular heartbeat  Vascular: No history of rest pain in feet.  No history of claudication.  No history of non-healing ulcer, No history of DVT   Pulmonary: No home oxygen, no productive cough, no hemoptysis,  No asthma or wheezing  Musculoskeletal:  [ ]  Arthritis, [ ]  Low back pain,  [ ]  Joint pain  Hematologic:No history of hypercoagulable state.  No history of easy bleeding.  No history of anemia  Gastrointestinal: No hematochezia or melena,  No gastroesophageal reflux, no trouble swallowing  Urinary: [ ]  chronic Kidney disease, [ ]  on HD - [ ]  MWF or [ ]  TTHS, [ ]  Burning with urination, [ ]  Frequent urination, [ ]  Difficulty urinating; renal insufficiency.  Skin: No rashes  Psychological: No history of anxiety,  No history of depression   Physical Examination  Filed Vitals:   04/24/12 1139  BP: 144/71  Pulse: 63  Height: 6' (1.829 m)  Weight: 269 lb 8 oz (122.244 kg)  SpO2: 97%    Body mass index is 36.55 kg/(m^2).  General:  Alert and oriented,  no acute distress HEENT: Normal Neck: No bruit or JVD Pulmonary: Clear to auscultation bilaterally Cardiac: Regular Rate and Rhythm without murmur Abdomen: Soft, non-tender, non-distended, no mass, no scars. diastasis  Skin: No rash Extremity Pulses:  2+ radial, brachial, femoral, dorsalis pedis, posterior tibial pulses bilaterally Musculoskeletal: No deformity or edema  Neurologic: Upper and lower extremity motor 5/5 and symmetric  DATA:  Ultra sound performed on 04-05-2012 at [facility] AAA measuring 4.4 cm.   ASSESSMENT/PLAN:: AAA < 5cm with no significant change from May 4.1 to now 4.4 The patient is asymptomatic and will be seen in one year.  We will order another ultra sound at that time.     Lianne Cure, PA-C [facility]  [place] Office: [phone] Pager: [phone]

## 2012-06-17 ENCOUNTER — Other Ambulatory Visit (HOSPITAL_COMMUNITY): Payer: Self-pay | Admitting: Psychiatry

## 2012-06-17 DIAGNOSIS — F329 Major depressive disorder, single episode, unspecified: Secondary | ICD-10-CM

## 2012-06-25 ENCOUNTER — Ambulatory Visit (HOSPITAL_COMMUNITY): Payer: Medicare Other | Admitting: Licensed Clinical Social Worker

## 2012-06-25 ENCOUNTER — Encounter (HOSPITAL_COMMUNITY): Payer: Self-pay | Admitting: Licensed Clinical Social Worker

## 2012-06-25 NOTE — Progress Notes (Signed)
Patient ID: Fernando Carr, male   DOB: 12/02/1940, 71 y.o.   MRN: [id number] Patient was a no show/no call for appointment.

## 2012-07-09 ENCOUNTER — Ambulatory Visit (HOSPITAL_COMMUNITY): Payer: Self-pay | Admitting: Psychiatry

## 2012-07-10 ENCOUNTER — Encounter (HOSPITAL_COMMUNITY): Payer: Self-pay

## 2012-07-10 ENCOUNTER — Ambulatory Visit (HOSPITAL_COMMUNITY): Payer: Self-pay | Admitting: Psychiatry

## 2012-07-12 ENCOUNTER — Ambulatory Visit (HOSPITAL_COMMUNITY): Payer: Self-pay | Admitting: Psychiatry

## 2012-07-12 ENCOUNTER — Encounter (HOSPITAL_COMMUNITY): Payer: Self-pay

## 2012-07-13 ENCOUNTER — Ambulatory Visit (INDEPENDENT_AMBULATORY_CARE_PROVIDER_SITE_OTHER): Payer: Medicare Other | Admitting: Licensed Clinical Social Worker

## 2012-07-13 ENCOUNTER — Encounter (HOSPITAL_COMMUNITY): Payer: Self-pay | Admitting: Licensed Clinical Social Worker

## 2012-07-13 DIAGNOSIS — F319 Bipolar disorder, unspecified: Secondary | ICD-10-CM | POA: Diagnosis not present

## 2012-07-13 NOTE — Progress Notes (Signed)
   THERAPIST PROGRESS NOTE  Session Time: 11:30am-12:00pm  Participation Level: Active  Behavioral Response: Well GroomedAlertEuthymic  Type of Therapy: Individual Therapy  Treatment Goals addressed: Coping  Interventions: Strength-based, Supportive and Reframing  Summary: Marcio Hoque Michalowski is a 71 y.o. male who presents with euthymic mood and bright affect. He reports doing well, with well controlled mood stability and an absence of any depression or mania. He feels that he is doing better saying no and has set some limits with women in his life as he tries to spend less money. He is enjoying his life and is happy that he did not succeed when he attempted suicide. He spends time with his granddaughter and enjoys this. His sleep is wnl and his appetite is increased as he appears to have gained more weight.  Suicidal/Homicidal: Nowithout intent/plan  Therapist Response: Assessed patients current functioning and reviewed progress. Reviewed coping strategies. Assessed patients safety and assisted in identifying protective factors.  Reviewed crisis plan with patient.Patient has been stable for quite some time with well controlled mood stability, management of stress and an active social life with a good support network. Discussed ending therapy due to stability which patient agrees to. Told patient that he can call and return to treatment if need be in the future.Reviewed patients self care plan. Assessed  progress related to self care. Patient's self care is good. Recommend proper diet, regular exercise, socialization and recreation.   Plan: Ending therapy due to consistent improvement.    Diagnosis: Axis I: bi ploar    Axis II: No diagnosis    Zanetta Dehaan, LCSW 07/13/2012

## 2012-07-17 ENCOUNTER — Ambulatory Visit (INDEPENDENT_AMBULATORY_CARE_PROVIDER_SITE_OTHER): Payer: Medicare Other | Admitting: Psychiatry

## 2012-07-17 ENCOUNTER — Encounter (HOSPITAL_COMMUNITY): Payer: Self-pay | Admitting: Psychiatry

## 2012-07-17 VITALS — BP 141/83 | HR 63 | Wt 276.4 lb

## 2012-07-17 DIAGNOSIS — Z79899 Other long term (current) drug therapy: Secondary | ICD-10-CM

## 2012-07-17 DIAGNOSIS — F323 Major depressive disorder, single episode, severe with psychotic features: Secondary | ICD-10-CM | POA: Diagnosis not present

## 2012-07-17 DIAGNOSIS — F329 Major depressive disorder, single episode, unspecified: Secondary | ICD-10-CM

## 2012-07-17 NOTE — Progress Notes (Signed)
Patient ID: Fernando Carr, male   DOB: [birth year], 71 y.o.   MRN: [id number] Chief complaint I still cannot lose weight.  I'm off Risperdal for past 4 months.      History of present illness Patient is 71 year old Caucasian man who came for her followup appointment.  She's compliant with his Lexapro and denies any side effects.  He continued to have difficulty losing his weight.  He is off from Risperdal for more than 4 months.  He has not seen any weight loss.  He feel Lexapro is working very well.  He denies any hallucination agitation anger or any insomnia.  He denies any paranoid thinking.  He feel his depression is much stable with Lexapro.  He is not taking simvastatin because he does not have any refill .  He has not seen his primary care physician .  However he saw recently vascular surgeon and he was pleased that his aneurysm is not growing. He was recommended to see in one year.  He has a good Thanksgiving .  He visited [place] to visit her friend however one day he forgot to take his Lexapro and get confused.  He lost direction but eventually he was helped by the neighbors.  He is taking Lexapro now regularly.  He denies any crying spells , social isolation or any anhedonia.  He denies any depressive symptoms or any anxiety symptoms.  He's not drinking or using any illegal substance.  His appetite and sleep is unchanged from the past.  He denies any tremors or shakes.  He saw his therapist however he has no further schedule appointment as he was told that he is improving and does not need any new appointment.    Current psychiatric medication Lexapro 20 mg daily  Past psychiatric history Patient has at least one psychiatric inpatient treatment in 2006 detail suicidal attempt. He injected Ethyline Glycol. 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 He was feeling hopeless helpless and having paranoid thoughts. He had lost property and having significant financial  burden.  Medical history Hyperlipidemia and obesity.  He see Dr. Lanora Manis at [facility].   Alcohol and substance use history The patient has history of alcohol use in the past. He has 2 DWI and his license was suspended. Patient has been sober in recent months.  Psychosocial history Patient born and grew up in [place]. 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 He has one daughter who lives in [place].  Patient is still has property in [place].  Family history Patient denies any history of family psychiatric illness  Review of Systems  Constitutional: Positive for malaise/fatigue.       Weight gain, tired  Musculoskeletal: Positive for back pain and joint pain.  Neurological: Negative.   Psychiatric/Behavioral: Negative for depression, suicidal ideas, hallucinations and substance abuse. The patient is nervous/anxious. The patient does not have insomnia.    Mental status examination Patient is casually dressed and fairly groomed.  He appears to be in his his stated age.  He is pleasant cooperative and maintained good eye contact. His speech is clear and coherent. His thought process logical linear and goal-directed. He denies any active or passive suicidal thoughts or homicidal thoughts. He denies any auditory or visual hallucination.  There were no flight of idea or any loose association.  His attention and concentration is fair.  His fund of knowledge is adequate.  There no psychotic symptoms present at this time.  He described his mood is okay and his affect is mood congruent. He's alert and oriented x3. His insight judgment and impulse control is okay.  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  Patient is not taking Risperdal for past 4  months and does not exhibit any psychotic symptoms.  Patient continues to have trouble losing his weight .  Recommend watching his calorie intake and is strongly recommended exercise.  I also recommend to see his primary care physician and consider restarting simvastatin .  Patient does not have any blood work , I will order CBC, CMP and hemoglobin A 1C beach has not done 11 months.  I explain in detail the risk and benefits of medication and recommend to call us if he is any question or concern about the medication if he feel worsening of the symptoms.  I will see him again in 2 months.  Time spent 30 minutes.  More than 50% of time spent in psychoeducation, counseling and coordination of care.    Portion of this note is generated with voice recognition software and may contain typographical error.

## 2012-07-20 DIAGNOSIS — F329 Major depressive disorder, single episode, unspecified: Secondary | ICD-10-CM | POA: Diagnosis not present

## 2012-07-20 DIAGNOSIS — Z79899 Other long term (current) drug therapy: Secondary | ICD-10-CM | POA: Diagnosis not present

## 2012-07-20 LAB — CBC WITH DIFFERENTIAL/PLATELET
Basophils Absolute: 0 10*3/uL (ref 0.0–0.1)
Basophils Relative: 0 % (ref 0–1)
Eosinophils Absolute: 0 10*3/uL (ref 0.0–0.7)
Eosinophils Relative: 1 % (ref 0–5)
HCT: 41.6 % (ref 39.0–52.0)
Hemoglobin: 14.4 g/dL (ref 13.0–17.0)
Lymphocytes Relative: 23 % (ref 12–46)
Lymphs Abs: 1.5 10*3/uL (ref 0.7–4.0)
MCH: 30.1 pg (ref 26.0–34.0)
MCHC: 34.6 g/dL (ref 30.0–36.0)
MCV: 86.8 fL (ref 78.0–100.0)
Monocytes Absolute: 0.6 10*3/uL (ref 0.1–1.0)
Monocytes Relative: 9 % (ref 3–12)
Neutro Abs: 4.4 10*3/uL (ref 1.7–7.7)
Neutrophils Relative %: 67 % (ref 43–77)
Platelets: 184 10*3/uL (ref 150–400)
RBC: 4.79 MIL/uL (ref 4.22–5.81)
RDW: 13 % (ref 11.5–15.5)
WBC: 6.6 10*3/uL (ref 4.0–10.5)

## 2012-07-20 LAB — COMPREHENSIVE METABOLIC PANEL
ALT: 18 U/L (ref 0–53)
AST: 18 U/L (ref 0–37)
Albumin: 3.8 g/dL (ref 3.5–5.2)
Alkaline Phosphatase: 110 U/L (ref 39–117)
BUN: 20 mg/dL (ref 6–23)
CO2: 27 mEq/L (ref 19–32)
Calcium: 10.2 mg/dL (ref 8.4–10.5)
Chloride: 102 mEq/L (ref 96–112)
Creat: 1.21 mg/dL (ref 0.50–1.35)
Glucose, Bld: 104 mg/dL — ABNORMAL HIGH (ref 70–99)
Potassium: 4.8 mEq/L (ref 3.5–5.3)
Sodium: 137 mEq/L (ref 135–145)
Total Bilirubin: 0.4 mg/dL (ref 0.3–1.2)
Total Protein: 6.8 g/dL (ref 6.0–8.3)

## 2012-07-20 LAB — HEMOGLOBIN A1C
Hgb A1c MFr Bld: 5.6 % (ref <5.7)
Mean Plasma Glucose: 114 mg/dL (ref <117)

## 2012-09-17 ENCOUNTER — Ambulatory Visit (HOSPITAL_COMMUNITY): Payer: Self-pay | Admitting: Psychiatry

## 2012-09-19 DIAGNOSIS — M25579 Pain in unspecified ankle and joints of unspecified foot: Secondary | ICD-10-CM | POA: Diagnosis not present

## 2012-09-21 ENCOUNTER — Encounter (HOSPITAL_COMMUNITY): Payer: Self-pay

## 2012-09-21 ENCOUNTER — Ambulatory Visit (HOSPITAL_COMMUNITY): Payer: Self-pay | Admitting: Psychiatry

## 2012-10-03 DIAGNOSIS — S82409A Unspecified fracture of shaft of unspecified fibula, initial encounter for closed fracture: Secondary | ICD-10-CM | POA: Diagnosis not present

## 2012-10-18 DIAGNOSIS — S82409A Unspecified fracture of shaft of unspecified fibula, initial encounter for closed fracture: Secondary | ICD-10-CM | POA: Diagnosis not present

## 2012-11-01 ENCOUNTER — Encounter (HOSPITAL_COMMUNITY): Payer: Self-pay | Admitting: Psychiatry

## 2012-11-01 ENCOUNTER — Telehealth (HOSPITAL_COMMUNITY): Payer: Self-pay

## 2012-11-01 ENCOUNTER — Ambulatory Visit (INDEPENDENT_AMBULATORY_CARE_PROVIDER_SITE_OTHER): Payer: Medicare Other | Admitting: Psychiatry

## 2012-11-01 VITALS — BP 142/82 | HR 62 | Wt 265.4 lb

## 2012-11-01 DIAGNOSIS — F329 Major depressive disorder, single episode, unspecified: Secondary | ICD-10-CM

## 2012-11-01 DIAGNOSIS — F323 Major depressive disorder, single episode, severe with psychotic features: Secondary | ICD-10-CM

## 2012-11-01 MED ORDER — ESCITALOPRAM OXALATE 20 MG PO TABS: ORAL_TABLET | ORAL | Status: DC

## 2012-11-01 NOTE — Progress Notes (Signed)
[facility] [address] Progress Note  Fernando Carr [id number] 72 y.o.  11/01/2012 10:32 AM  Chief Complaint: I fell and broke my leg.  History of Present Illness: Patient is a 72 year old Caucasian male who came for his followup appointment.  Patient is using crutches.  He fell in February while walking on snow and ice.  Initially he required cast however now he is feeling heavy boot.  He is hoping to get her up this boot on April 17th.  He is sleeping better.  He is able to lose some weight and he is happy about it.  He cut down her smoking.  Denies any agitation anger functioning.  He likes his current psychiatric medication.  He start taking a simvastatin.  However he is actively looking for his new primary care physician since Dr. Lanora Manis is no longer working at [employer].  Patient denies any hallucination or any paranoia.  He is frequently visiting his daughter who lives in [place].  Is not drinking or using any illegal substances.  He had blood work on January 20 which shows normal CBC and his hemoglobin A1c is 5.6.  Suicidal Ideation: No Plan Formed: No Patient has means to carry out plan: No  Homicidal Ideation: No Plan Formed: No Patient has means to carry out plan: No  Review of Systems: Psychiatric: Agitation: No Hallucination: No Depressed Mood: No Insomnia: No Hypersomnia: No Altered Concentration: No Feels Worthless: No Grandiose Ideas: No Belief In Special Powers: No New/Increased Substance Abuse: No Compulsions: No  Neurologic: Headache: No Seizure: No Paresthesias: No  Medical History:  Patient has a history of hyperlipidemia, obesity and recently fell and fractured his right fibula.  Alcohol and substance use history. Patient has a history of alcohol use in the past.  He has 2 DWI and his license was suspended.  Patient claims to be sober in recent years.  He does not use any illegal substance.  Psychosocial history. Patient is born and grew up in  [place].  He was working as a Engineer, site and retired in 1992.  He has one daughter who lives in [place].  Patient still has property in [place]  Outpatient Encounter Prescriptions as of 11/01/2012  Medication Sig Dispense Refill  . escitalopram (LEXAPRO) 20 MG tablet TAKE 1 TABLET DAILY  90 tablet  0  . fenofibrate micronized (LOFIBRA) 67 MG capsule       . simvastatin (ZOCOR) 20 MG tablet Take 10 mg by mouth daily.       . [DISCONTINUED] escitalopram (LEXAPRO) 20 MG tablet TAKE 1 TABLET DAILY  90 tablet  0  . [DISCONTINUED] Vitamin D, Ergocalciferol, (DRISDOL) 50000 UNITS CAPS        No facility-administered encounter medications on file as of 11/01/2012.    Past Psychiatric History/Hospitalization(s): Past psychiatric history  Patient has at least one psychiatric inpatient treatment in 2006 detail suicidal attempt. He injected Ethyline Glycol. 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 He was feeling hopeless helpless and having paranoid thoughts. He had lost property and having significant financial burden.   Anxiety: No Bipolar Disorder: No Depression: Yes Mania: No Psychosis: Yes Schizophrenia: No Personality Disorder: No Hospitalization for psychiatric illness: Yes History of Electroconvulsive Shock Therapy: No Prior Suicide Attempts: Yes  Physical Exam: Constitutional:  BP 142/82  Pulse 62  Wt 265 lb 6.4 oz (120.385 kg)  BMI 35.99 kg/m2  General Appearance: alert, oriented, no acute distress, well nourished, obese and Using crutches and feeling  heavy boots.  He is cooperative and maintained good eye contact.  Review of Systems  Musculoskeletal: Positive for falls.       Leg pain   Musculoskeletal: Strength & Muscle Tone: within normal limits Gait & Station: unsteady Patient leans: N/A  Psychiatric: Speech (describe rate, volume, coherence, spontaneity, and abnormalities if any): clear and coherent normal tone and volume.  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 Depression  . Hyperlipemia  . Bipolar 1 disorder  . Abdominal aneurysm without mention of rupture    Axis IV: Mild  Axis V: 60-65   Plan: I reviewed his symptoms and response to medication.  I also reviewed his blood work which is normal.  We will continue his Lexapro at present dose.  I provided contact information for [facility] physician if he needed for his primary care needs.  Recommend to call us if he is a question up in [place] for stealing the symptom.  I will see him again in 3 months.  Time spent 25 minutes.    Tsion Inghram T., MD 11/01/2012

## 2012-11-15 DIAGNOSIS — S82409A Unspecified fracture of shaft of unspecified fibula, initial encounter for closed fracture: Secondary | ICD-10-CM | POA: Diagnosis not present

## 2012-11-27 DIAGNOSIS — H35049 Retinal micro-aneurysms, unspecified, unspecified eye: Secondary | ICD-10-CM | POA: Diagnosis not present

## 2012-11-27 DIAGNOSIS — H43399 Other vitreous opacities, unspecified eye: Secondary | ICD-10-CM | POA: Diagnosis not present

## 2012-11-27 DIAGNOSIS — H35379 Puckering of macula, unspecified eye: Secondary | ICD-10-CM | POA: Diagnosis not present

## 2012-11-27 DIAGNOSIS — H2510 Age-related nuclear cataract, unspecified eye: Secondary | ICD-10-CM | POA: Diagnosis not present

## 2012-12-14 DIAGNOSIS — S82409A Unspecified fracture of shaft of unspecified fibula, initial encounter for closed fracture: Secondary | ICD-10-CM | POA: Diagnosis not present

## 2012-12-14 DIAGNOSIS — M25579 Pain in unspecified ankle and joints of unspecified foot: Secondary | ICD-10-CM | POA: Diagnosis not present

## 2012-12-19 ENCOUNTER — Ambulatory Visit: Payer: Self-pay | Admitting: Neurosurgery

## 2012-12-20 DIAGNOSIS — S82409A Unspecified fracture of shaft of unspecified fibula, initial encounter for closed fracture: Secondary | ICD-10-CM | POA: Diagnosis not present

## 2012-12-21 DIAGNOSIS — F329 Major depressive disorder, single episode, unspecified: Secondary | ICD-10-CM | POA: Diagnosis not present

## 2012-12-21 DIAGNOSIS — F17200 Nicotine dependence, unspecified, uncomplicated: Secondary | ICD-10-CM | POA: Diagnosis not present

## 2012-12-21 DIAGNOSIS — E782 Mixed hyperlipidemia: Secondary | ICD-10-CM | POA: Diagnosis not present

## 2012-12-21 DIAGNOSIS — N529 Male erectile dysfunction, unspecified: Secondary | ICD-10-CM | POA: Diagnosis not present

## 2012-12-21 DIAGNOSIS — I714 Abdominal aortic aneurysm, without rupture: Secondary | ICD-10-CM | POA: Diagnosis not present

## 2012-12-21 DIAGNOSIS — Z1331 Encounter for screening for depression: Secondary | ICD-10-CM | POA: Diagnosis not present

## 2013-01-09 ENCOUNTER — Other Ambulatory Visit (HOSPITAL_COMMUNITY): Payer: Self-pay | Admitting: Psychiatry

## 2013-01-14 ENCOUNTER — Other Ambulatory Visit (HOSPITAL_COMMUNITY): Payer: Self-pay | Admitting: Psychiatry

## 2013-01-22 DIAGNOSIS — Z79899 Other long term (current) drug therapy: Secondary | ICD-10-CM | POA: Diagnosis not present

## 2013-01-25 DIAGNOSIS — S82409A Unspecified fracture of shaft of unspecified fibula, initial encounter for closed fracture: Secondary | ICD-10-CM | POA: Diagnosis not present

## 2013-01-31 ENCOUNTER — Ambulatory Visit (HOSPITAL_COMMUNITY): Payer: Medicare Other | Admitting: Psychiatry

## 2013-02-13 ENCOUNTER — Encounter (HOSPITAL_COMMUNITY): Payer: Self-pay | Admitting: Psychiatry

## 2013-02-13 ENCOUNTER — Ambulatory Visit (INDEPENDENT_AMBULATORY_CARE_PROVIDER_SITE_OTHER): Payer: Medicare Other | Admitting: Psychiatry

## 2013-02-13 VITALS — BP 148/88 | HR 68 | Ht 72.0 in | Wt 255.4 lb

## 2013-02-13 DIAGNOSIS — F319 Bipolar disorder, unspecified: Secondary | ICD-10-CM

## 2013-02-13 MED ORDER — ARIPIPRAZOLE 5 MG PO TABS: 5.0000 mg | ORAL_TABLET | Freq: Every day | ORAL | Status: DC

## 2013-02-13 NOTE — Progress Notes (Signed)
[facility] [address] Progress Note  Fernando Carr [id number] 72 y.o.  02/13/2013 2:07 PM  Chief Complaint:  I think I'm going into mania.    History of Present Illness: Fernando Carr is a 72 year old Caucasian male who came for his followup appointment.  He is no longer using crutches and off from his cast.  Fernando Carr admitted he started to feel more happy, too much energy, not sleeping and feeling very good about himself.  He sleeping 4 hours.  He's getting easily distracted.  His granddaughter is living with him.  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  He has lost weight from the past. Fernando Carr admitted that he's been busy and feels sometimes that easily distracted.  He also admitted irritability and getting into verbal arguments with other people.  He has any hallucination or paranoia.  He seen his primary care physician who recommended him nephrologist however he does not know why.  Fernando Carr is waiting for his appointment to the nephrologist.  He seeing now Dr. Eula Listen at [facility] physician.  He do not have the results.  Is not drinking or using any illegal substance.  Suicidal Ideation: No Plan Formed: No Fernando Carr has means to carry out plan: No  Homicidal Ideation: No Plan Formed: No Fernando Carr has means to carry out plan: No  Review of Systems: Psychiatric: Agitation: No Hallucination: No Depressed Mood: No Insomnia: Yes Hypersomnia: No Altered Concentration: No Feels Worthless: No Grandiose Ideas: No Belief In Special Powers: No New/Increased Substance Abuse: No Compulsions: No  Neurologic: Headache: No Seizure: No Paresthesias: No  Medical History:  Fernando Carr has a history of hyperlipidemia, obesity and recently started seeing Dr. Haywood Pao at [facility] physician.  He's also recommended to see nephrologist .    Alcohol and substance use history. Fernando Carr has a history of alcohol use in the past.  He has 2 DWI and his license was suspended.  Fernando Carr claims to be sober in recent  years.  He does not use any illegal substance.  Psychosocial history. Fernando Carr is born and grew up in [place].  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  Fernando Carr still has property in [place]  Outpatient Encounter Prescriptions as of 02/13/2013  Medication Sig Dispense Refill  . escitalopram (LEXAPRO) 20 MG tablet TAKE 1 TABLET DAILY  90 tablet  0  . fenofibrate micronized (LOFIBRA) 67 MG capsule       . simvastatin (ZOCOR) 20 MG tablet Take 10 mg by mouth daily.       . ARIPiprazole (ABILIFY) 5 MG tablet Take 1 tablet (5 mg total) by mouth daily.  30 tablet  0   No facility-administered encounter medications on file as of 02/13/2013.    Past Psychiatric History/Hospitalization(s): Past psychiatric history  Fernando Carr has at least one psychiatric inpatient treatment in 2006 detail suicidal attempt. He injected Ethyline Glycol. 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 He was feeling hopeless helpless and having paranoid thoughts. He had lost property and having significant financial burden.   Anxiety: No Bipolar Disorder: No Depression: Yes Mania: No Psychosis: Yes Schizophrenia: No Personality Disorder: No Hospitalization for psychiatric illness: Yes History of Electroconvulsive Shock Therapy: No Prior Suicide Attempts: Yes  Physical Exam: Constitutional:  BP 148/88  Pulse 68  Ht 6' (1.829 m)  Wt 255 lb 6.4 oz (115.849 kg)  BMI 34.63 kg/m2  General Appearance: alert, oriented, no acute distress, well nourished, obese  and Maintain fair eye contact  Review of Systems  Constitutional: Positive for weight loss.  HENT: Negative.   Respiratory: Negative.   Cardiovascular: Negative.   Musculoskeletal: Positive for joint pain.       Leg pain  Neurological: Negative.   Psychiatric/Behavioral: The Fernando Carr is nervous/anxious and has insomnia.        Irritability and easily distracted    Musculoskeletal: Strength & Muscle Tone: within normal limits Gait & Station: unsteady Fernando Carr leans: N/A  Psychiatric: Speech (describe rate, volume, coherence, spontaneity, and abnormalities if any): fast and pressured at times.  Normal volume and tone.    Thought Process (describe rate, content, abstract reasoning, and computation): Rambling and some flight of ideas.  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 Abdominal aneurysm without mention of rupture 12/20/2011  . Bipolar 1 disorder 10/24/2011  . Depression 10/10/2011  . Hyperlipemia 10/10/2011    Axis IV: Mild  Axis V: 60-65   Plan: I believe Fernando Carr is decompensating slowly.  Fernando Carr used to take Risperdal.  I offer to start again but Fernando Carr refused.  He is willing to try any other medication.  He elected tried Abilify.  We will try Abilify 5 mg at bedtime.  He will continue Lexapro 20 mg.  Fernando Carr still has Lexapro remaining for another 4 weeks.  We will get records from his primary care physician.  I will also refer to see Belenda Cruise for counseling.  Discuss risk and benefits of medication especially Abilify can cause metabolic syndrome, EPS and sedation.  Recommend to call us back if he has any question concerns, worsening of symptoms or  any side effects.  Discuss safety plan that anytime having active suicidal thoughts or homicidal thoughts and he to call 911 or go to local emergency room.  I will see him again in 3 weeks.  Time spent 25 minutes.  More than 50% of the time spent in psychoeducation, counseling and coordination of care.   Amare Bail T., MD 02/13/2013

## 2013-02-20 ENCOUNTER — Ambulatory Visit (INDEPENDENT_AMBULATORY_CARE_PROVIDER_SITE_OTHER): Payer: Medicare Other | Admitting: Licensed Clinical Social Worker

## 2013-02-20 ENCOUNTER — Other Ambulatory Visit (HOSPITAL_COMMUNITY): Payer: Self-pay | Admitting: Psychiatry

## 2013-02-20 DIAGNOSIS — F319 Bipolar disorder, unspecified: Secondary | ICD-10-CM | POA: Diagnosis not present

## 2013-02-20 NOTE — Progress Notes (Signed)
   THERAPIST PROGRESS NOTE  Session Time: 9:30am-10:20am  Participation Level: Active  Behavioral Response: Well GroomedAlertAnxious and manic  Type of Therapy: Individual Therapy  Treatment Goals addressed: Coping  Interventions: CBT, Solution Focused, Strength-based, Supportive and Reframing  Summary: Fernando Carr is a 72 y.o. male who presents with manic mood and excited affect. Patient returns to therapy after a long break due to a current manic episode induced by stopping his medication. He has been given samples of Abilify and has lost the remainder of them. He is easily distracted, with pressured and tangential speech. He will need new samples or a prescription called in. Sent communication to Dr. Lolly Mustache and Cristela Felt about this. He describes "being at war" with multiple women in his life who he has helped financially. 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 He is agitated by the position he is in. He has not purchased any more cars, but is interested in purchasing another home. He agrees to avoid any large ticket purchases while his mood is regulated on medication. His sleep is decreased and his appetite is wnl, with weight loss.    Suicidal/Homicidal: Nowithout intent/plan  Therapist Response: Assessed patients current functioning and reviewed progress. Reviewed coping strategies. Assessed patients safety and assisted in identifying protective factors.  Reviewed crisis plan with patient. Assisted patient with the expression of frustration. Reviewed patients self care plan. Assessed progress related to self care. Patients self care is good. Recommend daily exercise, increased socialization and recreation. Used CBT to assist patient with the identification of negative distortions and irrational thoughts. Encouraged patient to verbalize alternative and factual responses which challenge thought distortions. Used DBT to practice mindfulness, review  distraction list and improve distress tolerance skills. Processed consequences of increased impulsivity and provided feedback and direction.   Plan: Return again in one weeks.  Diagnosis: Axis I: Bipolar, Manic    Axis II: No diagnosis    Kryslyn Helbig, LCSW 02/20/2013

## 2013-02-28 ENCOUNTER — Ambulatory Visit (HOSPITAL_COMMUNITY): Payer: Self-pay | Admitting: Licensed Clinical Social Worker

## 2013-02-28 ENCOUNTER — Encounter (HOSPITAL_COMMUNITY): Payer: Self-pay | Admitting: Licensed Clinical Social Worker

## 2013-02-28 NOTE — Progress Notes (Signed)
Patient ID: Fernando Carr, male   DOB: [birth year], 72 y.o.   MRN: [id number] Patient was a no show.

## 2013-03-06 ENCOUNTER — Ambulatory Visit (INDEPENDENT_AMBULATORY_CARE_PROVIDER_SITE_OTHER): Payer: Medicare Other | Admitting: Psychiatry

## 2013-03-06 ENCOUNTER — Encounter (HOSPITAL_COMMUNITY): Payer: Self-pay | Admitting: Psychiatry

## 2013-03-06 VITALS — BP 132/76 | HR 82 | Ht 72.0 in | Wt 251.0 lb

## 2013-03-06 DIAGNOSIS — F329 Major depressive disorder, single episode, unspecified: Secondary | ICD-10-CM | POA: Diagnosis not present

## 2013-03-06 DIAGNOSIS — F319 Bipolar disorder, unspecified: Secondary | ICD-10-CM

## 2013-03-06 DIAGNOSIS — F29 Unspecified psychosis not due to a substance or known physiological condition: Secondary | ICD-10-CM | POA: Diagnosis not present

## 2013-03-06 MED ORDER — ARIPIPRAZOLE 5 MG PO TABS: 5.0000 mg | ORAL_TABLET | Freq: Every day | ORAL | Status: DC

## 2013-03-06 MED ORDER — ESCITALOPRAM OXALATE 20 MG PO TABS: ORAL_TABLET | ORAL | Status: DC

## 2013-03-06 NOTE — Progress Notes (Signed)
[facility] [address] Progress Note  Artrell Lawless Dye [id number] 72 y.o.  03/06/2013 3:53 PM  Chief Complaint:  I lost Abilify samples.    History of Present Illness: Patient is a 72 year old Caucasian male who came for his followup appointment.  On this last visit we have given Abilify samples.  Patient took samples for 5 days but he lost the rest.  He did not call us for more samples.  Patient do not report any side effects with Abilify.  He continues to have too much energy and not sleeping very well.  He sleeping only 3-4 hours.  He continues to get easily distracted.  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  Patient is thinking to take legal actions.  Recently he bought a house which was for closure but did not provide much details.  He has not seen therapist because he lost appointment card.  His granddaughter is not living with them anymore.  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  He admitted irritability but denies any active or passive suicidal thoughts or homicidal thoughts.  Patient admitted that he likes increased energy however he agreed to try Abilify one more time.  He denies any hallucination or any paranoid thinking.  Suicidal Ideation: No Plan Formed: No Patient has means to carry out plan: No  Homicidal Ideation: No Plan Formed: No Patient has means to carry out plan: No  Review of Systems: Psychiatric: Agitation: No Hallucination: No Depressed Mood: No Insomnia: Yes Hypersomnia: No Altered Concentration: No Feels Worthless: No Grandiose Ideas: No Belief In Special Powers: No New/Increased Substance Abuse: No Compulsions: No  Neurologic: Headache: No Seizure: No Paresthesias: No  Medical History:  Patient has a history of hyperlipidemia, obesity and recently started seeing Dr. Haywood Pao at [facility] physician.  He's also recommended to see nephrologist .     Alcohol and substance use history. Patient has a history of alcohol use in the past.  He has 2 DWI and his license was suspended.  Patient claims to be sober in recent years.  He does not use any illegal substance.  Psychosocial history. Patient is born and grew up in [place].  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  Patient still has property in [place]  Outpatient Encounter Prescriptions as of 03/06/2013  Medication Sig Dispense Refill  . ARIPiprazole (ABILIFY) 5 MG tablet Take 1 tablet (5 mg total) by mouth daily.  30 tablet  0  . escitalopram (LEXAPRO) 20 MG tablet TAKE 1 TABLET DAILY  90 tablet  0  . fenofibrate micronized (LOFIBRA) 67 MG capsule       . simvastatin (ZOCOR) 20 MG tablet Take 10 mg by mouth daily.       . [DISCONTINUED] ARIPiprazole (ABILIFY) 5 MG tablet Take 1 tablet (5 mg total) by mouth daily.  30 tablet  0  . [DISCONTINUED] escitalopram (LEXAPRO) 20 MG tablet TAKE 1 TABLET DAILY  90 tablet  0  . [DISCONTINUED] fenofibrate 54 MG tablet Take 54 mg by mouth.       No facility-administered encounter medications on file as of 03/06/2013.   No results found for this or any previous visit (from the past 2160 hour(s)).  Past Psychiatric History/Hospitalization(s): Past psychiatric history  Patient has at least one psychiatric inpatient treatment in  2006 detail suicidal attempt. He injected Ethyline Glycol. 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 He was feeling hopeless helpless and having paranoid thoughts. He had lost property and having significant financial burden.   Anxiety: No Bipolar Disorder: No Depression: Yes Mania: No Psychosis: Yes Schizophrenia: No Personality Disorder: No Hospitalization for psychiatric illness: Yes History of Electroconvulsive Shock Therapy: No Prior Suicide Attempts: Yes  Physical Exam: Constitutional:  BP 132/76  Pulse 82  Ht 6' (1.829  m)  Wt 251 lb (113.853 kg)  BMI 34.03 kg/m2  General Appearance: alert, oriented, no acute distress, well nourished, obese and Maintain fair eye contact  Review of Systems  Constitutional: Positive for weight loss.  HENT: Negative.   Respiratory: Negative.   Cardiovascular: Negative.   Musculoskeletal: Positive for joint pain.       Leg pain  Neurological: Negative.   Psychiatric/Behavioral: The patient is nervous/anxious and has insomnia.        Irritability and easily distracted   Musculoskeletal: Strength & Muscle Tone: within normal limits Gait & Station: unsteady Patient leans: N/A  Psychiatric: Speech (describe rate, volume, coherence, spontaneity, and abnormalities if any): fast and pressured at times.  Normal volume and tone.    Thought Process (describe rate, content, abstract reasoning, and computation): Rambling and some flight of ideas.  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 Abdominal aneurysm without mention of rupture 12/20/2011  . Bipolar 1 disorder 10/24/2011  . Depression 10/10/2011  . Hyperlipemia 10/10/2011    Axis IV: Mild  Axis V: 60-65   Plan: I believe patient has been decompensating slowly.  I will continue Lexapro at present dose and call Abilify 5 mg to  his local pharmacy .  Encourage him to take the medication and let us know if he has any side effects.  Recommend to keep appointment with his therapist.   I will see him again in 3-4 weeks.    [name],SYED T., MD 03/06/2013

## 2013-04-03 ENCOUNTER — Ambulatory Visit (HOSPITAL_COMMUNITY): Payer: Self-pay | Admitting: Psychiatry

## 2013-04-04 ENCOUNTER — Encounter (HOSPITAL_COMMUNITY): Payer: Self-pay | Admitting: Psychiatry

## 2013-04-04 ENCOUNTER — Ambulatory Visit (INDEPENDENT_AMBULATORY_CARE_PROVIDER_SITE_OTHER): Payer: Medicare Other | Admitting: Psychiatry

## 2013-04-04 VITALS — Wt 255.0 lb

## 2013-04-04 DIAGNOSIS — F329 Major depressive disorder, single episode, unspecified: Secondary | ICD-10-CM

## 2013-04-04 DIAGNOSIS — F319 Bipolar disorder, unspecified: Secondary | ICD-10-CM

## 2013-04-04 DIAGNOSIS — F29 Unspecified psychosis not due to a substance or known physiological condition: Secondary | ICD-10-CM

## 2013-04-04 MED ORDER — ARIPIPRAZOLE 5 MG PO TABS: 5.0000 mg | ORAL_TABLET | Freq: Every day | ORAL | Status: DC

## 2013-04-04 NOTE — Progress Notes (Signed)
Fernando Carr [id number] Progress Note  Fernando Carr [id number] 72 y.o.  04/04/2013 2:19 PM  Chief Complaint:  I am sleeping better.      History of Present Illness: Fernando Carr is a 72 year old Caucasian male who came for his followup appointment.  Fernando Carr is taking Abilify half tablet every night.  Fernando Carr told that he tried full tablet thus day and became very groggy and now he is taking half tablet which is 2.5 mg.  Vision told that his sleep is much improved however he continues to have irritability anger and mood swings.  Fernando Carr is very upset on his daughter who purchased the car which requires a lot of gas .  Fernando Carr is thinking to take his car from her .  Fernando Carr recently bought a legal shield that prevents him making wrong decisions.  Fernando Carr did not provide much information about that.  He has not seen a therapist in past 2 months.  He admitted irritability anger and racing thoughts but admitted much better sleep since he is taking Abilify.  He denies any tremors or shakes.  He is not drinking or using any illegal substance.  He denies any hallucination or any paranoid thinking.    Suicidal Ideation: No Plan Formed: No Fernando Carr has means to carry out plan: No  Homicidal Ideation: No Plan Formed: No Fernando Carr has means to carry out plan: No  Review of Systems: Psychiatric: Agitation: Yes Hallucination: No Depressed Mood: No Insomnia: No Hypersomnia: No Altered Concentration: No Feels Worthless: No Grandiose Ideas: No Belief In Special Powers: No New/Increased Substance Abuse: No Compulsions: No  Neurologic: Headache: No Seizure: No Paresthesias: No  Medical History:  Fernando Carr has a history of hyperlipidemia, obesity and recently started seeing Dr. Haywood Pao at [facility] physician.  He's also recommended to see nephrologist .    Alcohol and substance use history. Fernando Carr has a history of alcohol use in the past.  He has 2 DWI and his license was suspended.  Fernando Carr claims to be  sober in recent years.  He does not use any illegal substance.  Psychosocial history. Fernando Carr is born and grew up in [place].  He was working as a Engineer, site and retired in 1992.  He has one daughter who lives in [place].  Fernando Carr still has property in [place]  Outpatient Encounter Prescriptions as of 04/04/2013  Medication Sig Dispense Refill  . CIALIS 20 MG tablet       . escitalopram (LEXAPRO) 10 MG tablet Take 10 mg by mouth daily.      Marland Kitchen escitalopram (LEXAPRO) 20 MG tablet TAKE 1 TABLET DAILY  90 tablet  0  . fenofibrate micronized (LOFIBRA) 67 MG capsule       . simvastatin (ZOCOR) 20 MG tablet Take 10 mg by mouth daily.       . [DISCONTINUED] ARIPiprazole (ABILIFY) 5 MG tablet Take 1 tablet (5 mg total) by mouth daily.  30 tablet  0  . ARIPiprazole (ABILIFY) 5 MG tablet Take 1 tablet (5 mg total) by mouth daily.  30 tablet  0   No facility-administered encounter medications on file as of 04/04/2013.   No results found for this or any previous visit (from the past 2160 hour(s)).  Past Psychiatric History/Hospitalization(s): Past psychiatric history  Fernando Carr has at least one psychiatric inpatient treatment in 2006 detail suicidal attempt. He injected Ethyline Glycol. 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 He was feeling hopeless helpless and having paranoid thoughts. He had lost  property and having significant financial burden.   Anxiety: No Bipolar Disorder: No Depression: Yes Mania: No Psychosis: Yes Schizophrenia: No Personality Disorder: No Hospitalization for psychiatric illness: Yes History of Electroconvulsive Shock Therapy: No Prior Suicide Attempts: Yes  Physical Exam: Constitutional:  Wt 255 lb (115.667 kg)  BMI 34.58 kg/m2  General Appearance: alert, oriented, no acute distress, well nourished, obese and Maintain fair eye contact  Review of Systems  Constitutional: Positive for weight loss.  HENT: Negative.   Respiratory:  Negative.   Cardiovascular: Negative.   Musculoskeletal: Positive for joint pain.       Leg pain  Neurological: Negative.   Psychiatric/Behavioral: The Fernando Carr is nervous/anxious and has insomnia.        Irritability and easily distracted   Musculoskeletal: Strength & Muscle Tone: within normal limits Gait & Station: unsteady Fernando Carr leans: N/A  Psychiatric: Speech (describe rate, volume, coherence, spontaneity, and abnormalities if any): fast and pressured at times.  Normal volume and tone.    Thought Process (describe rate, content, abstract reasoning, and computation): Rambling and some flight of ideas.  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 Abdominal aneurysm without mention of rupture 12/20/2011  . Bipolar 1 disorder 10/24/2011  . Depression 10/10/2011  . Hyperlipemia 10/10/2011    Axis IV: Mild  Axis V: 60-65   Plan: I recommended to try Abilify 5 mg for a few days and call us back if he continues to have sedation .  At this time Fernando Carr does not have any tremors or shakes.  I will reduce his Lexapro from 20 mg to 10 mg.  This can help some of his hypomanic symptoms.  I also strongly recommended to keep appointment with a therapist for coping and social skills.  Discussed in detail the risk  and benefits of medication .  Followup in 6 weeks.  Time spent 25 minutes.  More than 50% of the time spent in psychoeducation, counseling and coordination of care.  Discuss safety plan that anytime having active suicidal thoughts or homicidal thoughts then Fernando Carr need to call 911 or go to the local emergency room.  Jhalil Silvera T., MD 04/04/2013

## 2013-04-10 ENCOUNTER — Ambulatory Visit (INDEPENDENT_AMBULATORY_CARE_PROVIDER_SITE_OTHER): Payer: Medicare Other | Admitting: Licensed Clinical Social Worker

## 2013-04-10 DIAGNOSIS — F319 Bipolar disorder, unspecified: Secondary | ICD-10-CM

## 2013-04-10 NOTE — Progress Notes (Signed)
   THERAPIST PROGRESS NOTE  Session Time: 9:30am-10:20am  Participation Level: Active  Behavioral Response: Fairly GroomedAlertAnxious  Type of Therapy: Individual Therapy  Treatment Goals addressed: Coping  Interventions: CBT, DBT, Strength-based, Supportive and Reframing  Summary: Fernando Carr is a 72 y.o. male who presents with euthymic mood and anxious affect. He reports some improvement in his most recent manic state. He is calmer today and his speech is at a normal pace. He is easy to understand and not easily distracted. He is upset over conflict between him and his daughter and processes his frustration. He processes his struggle to understand why his daughter is upset with him when he helps other women financially. With feedback, patient is able to recognize his daughters pain. His sleep and appetite are wnl.   Suicidal/Homicidal: Nowithout intent/plan  Therapist Response: Assessed patients current functioning and reviewed progress. Reviewed coping strategies. Assessed patients safety and assisted in identifying protective factors.  Reviewed crisis plan with patient. Assisted patient with the expression of frustration. Reviewed patients self care plan. Assessed progress related to self care. Patients self care is good. Recommend daily exercise, increased socialization and recreation. Used CBT to assist patient with the identification of negative distortions and irrational thoughts. Encouraged patient to verbalize alternative and factual responses which challenge thought distortions. Reviewed healthy boundaries and assertive communication.   Plan: Return again in three to four weeks.  Diagnosis: Axis I: bi polar    Axis II: No diagnosis    [name], LCSW 04/10/2013

## 2013-04-18 DIAGNOSIS — I10 Essential (primary) hypertension: Secondary | ICD-10-CM | POA: Diagnosis not present

## 2013-04-18 DIAGNOSIS — N179 Acute kidney failure, unspecified: Secondary | ICD-10-CM | POA: Diagnosis not present

## 2013-04-18 DIAGNOSIS — Z8349 Family history of other endocrine, nutritional and metabolic diseases: Secondary | ICD-10-CM | POA: Diagnosis not present

## 2013-04-18 DIAGNOSIS — D649 Anemia, unspecified: Secondary | ICD-10-CM | POA: Diagnosis not present

## 2013-04-22 ENCOUNTER — Encounter: Payer: Self-pay | Admitting: Family

## 2013-04-23 ENCOUNTER — Ambulatory Visit: Payer: Self-pay | Admitting: Family

## 2013-04-26 DIAGNOSIS — E782 Mixed hyperlipidemia: Secondary | ICD-10-CM | POA: Diagnosis not present

## 2013-04-26 DIAGNOSIS — F329 Major depressive disorder, single episode, unspecified: Secondary | ICD-10-CM | POA: Diagnosis not present

## 2013-04-26 DIAGNOSIS — N183 Chronic kidney disease, stage 3 unspecified: Secondary | ICD-10-CM | POA: Diagnosis not present

## 2013-04-26 DIAGNOSIS — F17200 Nicotine dependence, unspecified, uncomplicated: Secondary | ICD-10-CM | POA: Diagnosis not present

## 2013-04-30 ENCOUNTER — Encounter: Payer: Self-pay | Admitting: Family

## 2013-05-01 ENCOUNTER — Encounter: Payer: Self-pay | Admitting: Family

## 2013-05-01 ENCOUNTER — Ambulatory Visit (INDEPENDENT_AMBULATORY_CARE_PROVIDER_SITE_OTHER): Payer: Medicare Other | Admitting: Family

## 2013-05-01 ENCOUNTER — Ambulatory Visit (HOSPITAL_COMMUNITY)
Admission: RE | Admit: 2013-05-01 | Discharge: 2013-05-01 | Disposition: A | Discharge status: Home / Self Care | Payer: Medicare Other | Source: Ambulatory Visit | Attending: Vascular Surgery | Admitting: Vascular Surgery

## 2013-05-01 VITALS — BP 110/61 | HR 54 | Resp 16 | Ht 72.0 in | Wt 262.0 lb

## 2013-05-01 DIAGNOSIS — I714 Abdominal aortic aneurysm, without rupture, unspecified: Secondary | ICD-10-CM | POA: Insufficient documentation

## 2013-05-01 NOTE — Patient Instructions (Addendum)
Abdominal Aortic Aneurysm  An aneurysm is the enlargement (dilatation), bulging, or ballooning out of part of the wall of a vein or artery. An aortic aneurysm is a bulging in the largest artery of the body. This artery supplies blood from the heart to the rest of the body.  The first part of the aorta is called the thoracic aorta. It leaves the heart, rises (ascends), arches, and goes down (descends) through the chest until it reaches the diaphragm. The diaphragm is the muscular part between the chest and abdomen.  The second part of the aorta is called the abdominal aorta after it has passed the diaphragm and continues down through the abdomen. The abdominal aorta ends where it splits to form the two iliac arteries that go to the legs. Aortic aneurysms can develop anywhere along the length of the aorta. The majority are located along the abdominal aorta. The major concern with an aortic aneurysm is that it can enlarge and rupture. This can cause death unless diagnosed and treated promptly. Aneurysms can also develop blood clots or infections. CAUSES  Many aortic aneurysms are caused by arteriosclerosis. Arteriosclerosis can weaken the aortic wall. The pressure of the blood being pumped through the aorta causes it to balloon out at the site of weakness. Therefore, high blood pressure (hypertension) is associated with aneurysm. Other risk factors include:  Age over 60.  Tobacco use.  Being male.  White race.  Family history of aneurysm.  Less frequent causes of abdominal aortic aneurysms include:  Connective tissue diseases.  Abdominal trauma.  Inflammation of blood vessles (arteritis).  Inherited (congenital) malformations.  Infection. SYMPTOMS  The signs and symptoms of an unruptured aneurysm will partly depend on its size and rate of growth.   Abdominal aortic aneurysms may cause pain. The pain typically has a deep quality as if it is piercing into the person. It is felt most  often in the lower back area. The pain is usually steady but may be relieved by changing your body position.  The person may also become aware of an abnormally prominent pulse in the belly (abdominal pulsation). DIAGNOSIS  An aortic aneurysm may be discovered by chance on physical exam, or on X-ray studies done for other reasons. It may be suspected because of other problems such as back or abdominal pain. The following tests may help identify the problem.  X-rays of the abdomen can show calcium deposits in the aneurysm wall.  CT scanning of the abdomen, particularly with contrast medium, is accurate at showing the exact size and shape of the aneurysm.  Ultrasounds give a clear picture of the size of an aneurysm (about 98% accuracy).  MRI scanning is accurate, but often unnecessary.  An abdominal angiogram shows the source of the major blood vessels arising from the aorta. It reveals the size and extent of any aneurysm. It can also show a clot clinging to the wall of the aneurysm (mural thrombus). TREATMENT  Treating an abdominal aortic aneurysm depends on the size. A rupture of an aneurysm is uncommon when they are less than 5 cm wide (2 inches). Rupture is far more common in aneurysms that are over 6 cm wide (2.4 inches).  Surgical repair is usually recommended for all aneurysms over 6 cm wide (2.4 inches). This depends on the health, age, and other circumstances of the individual. This type of surgery consists of opening the abdomen, removing the aneurysm, and sewing a synthetic graft (similar to a cloth tube) in its place. A   less invasive form of this surgery, using stent grafts, is sometimes recommended.  For most patients, elective repair is recommended for aneurysms between 4 and 6 cm (1.6 and 2.4 inches). Elective means the surgery can be done at your convenience. This should not be put off too long if surgery is recommended.  If you smoke, stop immediately. Smoking is a major risk  factor for enlargement and rupture.  Medications may be used to help decrease complications  these include medicine to lower blood pressure and control cholesterol. HOME CARE INSTRUCTIONS   If you smoke, stop. Do not start smoking.  Take all medications as prescribed.  Your caregiver will tell you when to have your aneurysm rechecked, either by ultrasound or CT scan.  If your caregiver has given you a follow-up appointment, it is very important to keep that appointment. Not keeping the appointment could result in a chronic or permanent injury, pain, or disability. If there is any problem keeping the appointment, you must call back to this facility for assistance. SEEK MEDICAL CARE IF:   You develop mild abdominal pain or pressure.  You are able to feel or perceive your aneurysm, and you sense any change. SEEK IMMEDIATE MEDICAL CARE IF:   You develop severe abdominal pain, or severe pain moving (radiating) to your back.  You suddenly develop cold or blue toes or feet.  You suddenly develop lightheadedness or fainting spells. MAKE SURE YOU:   Understand these instructions.  Will watch your condition.  Will get help right away if you are not doing well or get worse. Document Released: 04/27/2005 Document Revised: 10/10/2011 Document Reviewed: 02/19/2008 [facility] Patient Information 2014 [place], [place].  Smoking Cessation Quitting smoking is important to your health and has many advantages. However, it is not always easy to quit since nicotine is a very addictive drug. Often times, people try 3 times or more before being able to quit. This document explains the best ways for you to prepare to quit smoking. Quitting takes hard work and a lot of effort, but you can do it. ADVANTAGES OF QUITTING SMOKING  You will live longer, feel better, and live better.  Your body will feel the impact of quitting smoking almost immediately.  Within 20 minutes, blood pressure decreases. Your  pulse returns to its normal level.  After 8 hours, carbon monoxide levels in the blood return to normal. Your oxygen level increases.  After 24 hours, the chance of having a heart attack starts to decrease. Your breath, hair, and body stop smelling like smoke.  After 48 hours, damaged nerve endings begin to recover. Your sense of taste and smell improve.  After 72 hours, the body is virtually free of nicotine. Your bronchial tubes relax and breathing becomes easier.  After 2 to 12 weeks, lungs can hold more air. Exercise becomes easier and circulation improves.  The risk of having a heart attack, stroke, cancer, or lung disease is greatly reduced.  After 1 year, the risk of coronary heart disease is cut in half.  After 5 years, the risk of stroke falls to the same as a nonsmoker.  After 10 years, the risk of lung cancer is cut in half and the risk of other cancers decreases significantly.  After 15 years, the risk of coronary heart disease drops, usually to the level of a nonsmoker.  If you are pregnant, quitting smoking will improve your chances of having a healthy baby.  The people you live with, especially any children, will be  healthier.  You will have extra money to spend on things other than cigarettes. QUESTIONS TO THINK ABOUT BEFORE ATTEMPTING TO QUIT You may want to talk about your answers with your caregiver.  Why do you want to quit?  If you tried to quit in the past, what helped and what did not?  What will be the most difficult situations for you after you quit? How will you plan to handle them?  Who can help you through the tough times? Your family? Friends? A caregiver?  What pleasures do you get from smoking? What ways can you still get pleasure if you quit? Here are some questions to ask your caregiver:  How can you help me to be successful at quitting?  What medicine do you think would be best for me and how should I take it?  What should I do if I need  more help?  What is smoking withdrawal like? How can I get information on withdrawal? GET READY  Set a quit date.  Change your environment by getting rid of all cigarettes, ashtrays, matches, and lighters in your home, car, or work. Do not let people smoke in your home.  Review your past attempts to quit. Think about what worked and what did not. GET SUPPORT AND ENCOURAGEMENT You have a better chance of being successful if you have help. You can get support in many ways.  Tell your family, friends, and co-workers that you are going to quit and need their support. Ask them not to smoke around you.  Get individual, group, or telephone counseling and support. Programs are available at [employer] and health centers. Call your local health department for information about programs in your area.  Spiritual beliefs and practices may help some smokers quit.  Download a "quit meter" on your computer to keep track of quit statistics, such as how long you have gone without smoking, cigarettes not smoked, and money saved.  Get a self-help book about quitting smoking and staying off of tobacco. LEARN NEW SKILLS AND BEHAVIORS  Distract yourself from urges to smoke. Talk to someone, go for a walk, or occupy your time with a task.  Change your normal routine. Take a different route to work. Drink tea instead of coffee. Eat breakfast in a different place.  Reduce your stress. Take a hot bath, exercise, or read a book.  Plan something enjoyable to do every day. Reward yourself for not smoking.  Explore interactive web-based programs that specialize in helping you quit. GET MEDICINE AND USE IT CORRECTLY Medicines can help you stop smoking and decrease the urge to smoke. Combining medicine with the above behavioral methods and support can greatly increase your chances of successfully quitting smoking.  Nicotine replacement therapy helps deliver nicotine to your body without the negative effects  and risks of smoking. Nicotine replacement therapy includes nicotine gum, lozenges, inhalers, nasal sprays, and skin patches. Some may be available over-the-counter and others require a prescription.  Antidepressant medicine helps people abstain from smoking, but how this works is unknown. This medicine is available by prescription.  Nicotinic receptor partial agonist medicine simulates the effect of nicotine in your brain. This medicine is available by prescription. Ask your caregiver for advice about which medicines to use and how to use them based on your health history. Your caregiver will tell you what side effects to look out for if you choose to be on a medicine or therapy. Carefully read the information on the package. Do not  use any other product containing nicotine while using a nicotine replacement product.  RELAPSE OR DIFFICULT SITUATIONS Most relapses occur within the first 3 months after quitting. Do not be discouraged if you start smoking again. Remember, most people try several times before finally quitting. You may have symptoms of withdrawal because your body is used to nicotine. You may crave cigarettes, be irritable, feel very hungry, cough often, get headaches, or have difficulty concentrating. The withdrawal symptoms are only temporary. They are strongest when you first quit, but they will go away within 10 14 days. To reduce the chances of relapse, try to:  Avoid drinking alcohol. Drinking lowers your chances of successfully quitting.  Reduce the amount of caffeine you consume. Once you quit smoking, the amount of caffeine in your body increases and can give you symptoms, such as a rapid heartbeat, sweating, and anxiety.  Avoid smokers because they can make you want to smoke.  Do not let weight gain distract you. Many smokers will gain weight when they quit, usually less than 10 pounds. Eat a healthy diet and stay active. You can always lose the weight gained after you  quit.  Find ways to improve your mood other than smoking. FOR MORE INFORMATION  www.smokefree.gov  Document Released: 07/12/2001 Document Revised: 01/17/2012 Document Reviewed: 10/27/2011 [facility] Patient Information 2014 [place], [place].  Peripheral Vascular Disease Peripheral Vascular Disease (PVD), also called Peripheral Arterial Disease (PAD), is a circulation problem caused by cholesterol (atherosclerotic plaque) deposits in the arteries. PVD commonly occurs in the lower extremities (legs) but it can occur in other areas of the body, such as your arms. The cholesterol buildup in the arteries reduces blood flow which can cause pain and other serious problems. The presence of PVD can place a person at risk for Coronary Artery Disease (CAD).  CAUSES  Causes of PVD can be many. It is usually associated with more than one risk factor such as:   High Cholesterol.  Smoking.  Diabetes.  Lack of exercise or inactivity.  High blood pressure (hypertension).  Obesity.  Family history. SYMPTOMS   When the lower extremities are affected, patients with PVD may experience:  Leg pain with exertion or physical activity. This is called INTERMITTENT CLAUDICATION. This may present as cramping or numbness with physical activity. The location of the pain is associated with the level of blockage. For example, blockage at the abdominal level (distal abdominal aorta) may result in buttock or hip pain. Lower leg arterial blockage may result in calf pain.  As PVD becomes more severe, pain can develop with less physical activity.  In people with severe PVD, leg pain may occur at rest.  Other PVD signs and symptoms:  Leg numbness or weakness.  Coldness in the affected leg or foot, especially when compared to the other leg.  A change in leg color.  Patients with significant PVD are more prone to ulcers or sores on toes, feet or legs. These may take longer to heal or may reoccur. The ulcers or sores  can become infected.  If signs and symptoms of PVD are ignored, gangrene may occur. This can result in the loss of toes or loss of an entire limb.  Not all leg pain is related to PVD. Other medical conditions can cause leg pain such as:  Blood clots (embolism) or Deep Vein Thrombosis.  Inflammation of the blood vessels (vasculitis).  Spinal stenosis. DIAGNOSIS  Diagnosis of PVD can involve several different types of tests. These can include:  Pulse Volume Recording Method (PVR). This test is simple, painless and does not involve the use of X-rays. PVR involves measuring and comparing the blood pressure in the arms and legs. An ABI (Ankle-Brachial Index) is calculated. The normal ratio of blood pressures is 1. As this number becomes smaller, it indicates more severe disease.  < 0.95  indicates significant narrowing in one or more leg vessels.  <0.8 there will usually be pain in the foot, leg or buttock with exercise.  <0.4 will usually have pain in the legs at rest.  <0.25  usually indicates limb threatening PVD.  Doppler detection of pulses in the legs. This test is painless and checks to see if you have a pulses in your legs/feet.  A dye or contrast material (a substance that highlights the blood vessels so they show up on x-ray) may be given to help your caregiver better see the arteries for the following tests. The dye is eliminated from your body by the kidney's. Your caregiver may order blood work to check your kidney function and other laboratory values before the following tests are performed:  Magnetic Resonance Angiography (MRA). An MRA is a picture study of the blood vessels and arteries. The MRA machine uses a large magnet to produce images of the blood vessels.  Computed Tomography Angiography (CTA). A CTA is a specialized x-ray that looks at how the blood flows in your blood vessels. An IV may be inserted into your arm so contrast dye can be injected.  Angiogram. Is a  procedure that uses x-rays to look at your blood vessels. This procedure is minimally invasive, meaning a small incision (cut) is made in your groin. A small tube (catheter) is then inserted into the artery of your groin. The catheter is guided to the blood vessel or artery your caregiver wants to examine. Contrast dye is injected into the catheter. X-rays are then taken of the blood vessel or artery. After the images are obtained, the catheter is taken out. TREATMENT  Treatment of PVD involves many interventions which may include:  Lifestyle changes:  Quitting smoking.  Exercise.  Following a low fat, low cholesterol diet.  Control of diabetes.  Foot care is very important to the PVD patient. Good foot care can help prevent infection.  Medication:  Cholesterol-lowering medicine.  Blood pressure medicine.  Anti-platelet drugs.  Certain medicines may reduce symptoms of Intermittent Claudication.  Interventional/Surgical options:  Angioplasty. An Angioplasty is a procedure that inflates a balloon in the blocked artery. This opens the blocked artery to improve blood flow.  Stent Implant. A wire mesh tube (stent) is placed in the artery. The stent expands and stays in place, allowing the artery to remain open.  Peripheral Bypass Surgery. This is a surgical procedure that reroutes the blood around a blocked artery to help improve blood flow. This type of procedure may be performed if Angioplasty or stent implants are not an option. SEEK IMMEDIATE MEDICAL CARE IF:   You develop pain or numbness in your arms or legs.  Your arm or leg turns cold, becomes blue in color.  You develop redness, warmth, swelling and pain in your arms or legs. MAKE SURE YOU:   Understand these instructions.  Will watch your condition.  Will get help right away if you are not doing well or get worse. Document Released: 08/25/2004 Document Revised: 10/10/2011 Document Reviewed: 07/22/2008 [facility]  Patient Information 2014 [place], [place].

## 2013-05-01 NOTE — Addendum Note (Signed)
Addended by: Adria Dill L on: 05/01/2013 01:11 PM   Modules accepted: Orders

## 2013-05-01 NOTE — Progress Notes (Signed)
[facility] [place]  Established Abdominal Aortic Aneurysm  History of Present Illness  Fernando Carr is a 72 y.o. (23-Jan-1941) male who presents for f/u of his AAA.  He was seen by Dr. Duanne Guess due to kidney insufficiency when they saw the aortic finding and he was sent here for follow exam.  The patient does not have back or abdominal pain. The patient is a smoker. He walks at least 60 minutes daily. He denies any cardiac problems, denies chest pain or dyspnea. He states mild fatigue in the calves of his legs after walking less than a mile, denies non-healing wounds. Had a closed right ankle fracture with closed reduction in Feb. this year, states it healed well.  Patient denies ever having a stroke or TIA symptoms. His psychiatrist is Dr. Lolly Mustache, [facility], manages patient's bipolar disorder and depression.  Patient states has tried many times to quit smoking, and he would consider a medication that would decrease the craving for smoking. I advised him to ask his PCP or psychiatrist re this as his psychiatric and other medications need to be considered for possible interactions.  Pt Diabetic: No Pt smoker: smoker  (1 ppd x 45+ yrs)  Past Medical History  Diagnosis Date  . High cholesterol   . Bipolar disorder   . AAA (abdominal aortic aneurysm)   . Depression   . Renal insufficiency     chronic renal   . Suicide attempt aug. 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 Knee arthroscopy Left 1972   Social History History   Social History  . Marital Status: Single    Spouse Name: N/A    Number of Children: N/A  . Years of Education: N/A   Occupational History  . Not on file.   Social History Main Topics  . Smoking status: Current Every Day Smoker -- 1.00 packs/day for 40 years    Types: Cigarettes  . Smokeless tobacco: Never Used  . Alcohol Use: No  . Drug Use: No  .  Sexual Activity: No   Other Topics Concern  . Not on file   Social History Narrative  . No narrative on file   Family History Family History  Problem Relation Age of Onset  . Alcohol abuse Mother   . Alcohol abuse Father   . Suicidality Paternal Uncle   . Suicidality Cousin   . Depression Daughter     Current Outpatient Prescriptions on File Prior to Visit  Medication Sig Dispense Refill  . ARIPiprazole (ABILIFY) 5 MG tablet Take 1 tablet (5 mg total) by mouth daily.  30 tablet  0  . CIALIS 20 MG tablet       . escitalopram (LEXAPRO) 10 MG tablet Take 10 mg by mouth daily.      . fenofibrate micronized (LOFIBRA) 67 MG capsule       . simvastatin (ZOCOR) 20 MG tablet Take 10 mg by mouth daily.       Marland Kitchen escitalopram (LEXAPRO) 20 MG tablet TAKE 1 TABLET DAILY  90 tablet  0   No current facility-administered medications on file prior to visit.   No Known Allergies  ROS: [x]  Positive   [ ]  Negative   [ ]  All sytems reviewed and are negative  General: [ ]  Weight loss, [ ]  Fever, [ ]  chills Neurologic: [ ]  Dizziness, [ ]  Blackouts, [ ]  Seizure [ ]   Stroke, [ ]  "Mini stroke", [ ]  Slurred speech, [ ]  Temporary blindness; [ ]  weakness in arms or legs, [ ]  Hoarseness Cardiac: [ ]  Chest pain/pressure, [ ]  Shortness of breath at rest [ ]  Shortness of breath with exertion, [ ]  Atrial fibrillation or irregular heartbeat Vascular: [ ]  Pain in legs with walking, [ ]  Pain in legs at rest, [ ]  Pain in legs at night,  [ ]  Non-healing ulcer, [ ]  Blood clot in vein/DVT,   Pulmonary: [ ]  Home oxygen, [ ]  Productive cough, [ ]  Coughing up blood, [ ]  Asthma,  [ ]  Wheezing Musculoskeletal:  [ ]  Arthritis, [ ]  Low back pain, [ ]  Joint pain Hematologic: [ ]  Easy Bruising, [ ]  Anemia; [ ]  Hepatitis Gastrointestinal: [ ]  Blood in stool, [ ]  Gastroesophageal Reflux/heartburn, [ ]  Trouble swallowing Urinary: Arly.Keller ] chronic Kidney disease, [ ]  on HD - [ ]  MWF or [ ]  TTHS, [ ]  Burning with urination, [ ]   Difficulty urinating Skin: [ ]  Rashes, [ ]  Wounds Psychological: [ ]  Anxiety, [ ]  Depression  Physical Examination  Filed Vitals:   05/01/13 1037  BP: 110/61  Pulse: 54  Resp: 16  Height: 6' (1.829 m)  Weight: 262 lb (118.842 kg)  SpO2: 98%   Body mass index is 35.53 kg/(m^2).  General: A&O x 3, WD, Obese.  Pulmonary: Sym exp, good air movt, CTAB, no rales, rhonchi, or wheezing.  Cardiac: RRR, Nl S1, S2, no Murmurs, rubs or gallops.  Carotid Bruits Left Right   Negative Negative    Aorta is not palpable. Radial pulses are 2+ bilaterally.                         VASCULAR EXAM:                                                                                                         LE Pulses LEFT RIGHT       FEMORAL   palpable   palpable        POPLITEAL  not palpable   not palpable       POSTERIOR TIBIAL  not palpable    palpable        DORSALIS PEDIS      ANTERIOR TIBIAL  palpable   palpable      Gastrointestinal: soft, NTND, -G/R, - HSM, - masses, - CVAT B.  Musculoskeletal: M/S 5/5 throughout, Extremities without ischemic changes.  Neurologic: CN 2-12 intact, Pain and light touch intact in extremities, Motor exam as listed above.  Non-Invasive Vascular Imaging  AAA Duplex (05/01/2013)  Previous size: 4.0 cm (Date: 12/20/11)  Current size:  4.2 x 4.3 cm (Date: 05/01/2013)  Medical Decision Making  The patient is a 72 y.o. male who presents with: asymptomatic AAA with slight increase in size, increase of 0.3 cm in 1 year.   Based on this patient's exam and diagnostic studies, he needs to follow up in 1 year with the following studies: AAA Duplex and ABI's, and  to see me the same day after these studies.  The threshold for repair is AAA size > 5.5 cm, growth > 1 cm/yr, and symptomatic status.  I emphasized the importance of maximal medical management including strict control of blood pressure, blood glucose, and lipid levels, antiplatelet agents,  obtaining regular exercise, and cessation of smoking.   The patient was given information about AAA including signs, symptoms, treatment, and how to minimize the risk of enlargement and rupture of aneurysms. The patient was counseled re smoking cessation and given printed information re this.  The patient was advised to call 911 should the patient experience sudden onset abdominal or back pain.   Thank you for allowing [place] to participate in this patient's care.  Charisse March, RN, MSN, FNP-C [facility] [place] Office: [phone]  Clinic Physician: Edilia Bo  05/01/2013, 10:49 AM

## 2013-05-02 ENCOUNTER — Ambulatory Visit (INDEPENDENT_AMBULATORY_CARE_PROVIDER_SITE_OTHER): Payer: Medicare Other | Admitting: Licensed Clinical Social Worker

## 2013-05-02 DIAGNOSIS — F319 Bipolar disorder, unspecified: Secondary | ICD-10-CM | POA: Diagnosis not present

## 2013-05-02 NOTE — Progress Notes (Signed)
   THERAPIST PROGRESS NOTE  Session Time: 10:30am-11:20am  Participation Level: Active  Behavioral Response: Fairly GroomedAlertAnxious and Euthymic  Type of Therapy: Individual Therapy  Treatment Goals addressed: Coping  Interventions: CBT, Motivational Interviewing, Solution Focused, Strength-based, Supportive and Reframing  Summary: [name] is a 71 y.o. male who presents with euthymic mood and anxious affect. He reports some mood lability, but reports improvement in his manic state, with a decrease in energy, adequate sleep and controlled impulsivity. He endorses feeling overwhelmed by the amount of money he spends each month to support people in his life. He wants to set limits, but avoids this conflict until it has become a crisis. He processes his frustration with his daughter and how she assumes patient will pay all her expenses. Patient struggles with how much he wants to support his daughter. He continues to smoke and expresses a desire to quit, but has not been able to make any progress. His sleep and appetite are wnl.   Suicidal/Homicidal: Nowithout intent/plan  Therapist Response: Assessed patients current functioning and reviewed progress. Reviewed coping strategies. Assessed patients safety and assisted in identifying protective factors.  Reviewed crisis plan with patient. Assisted patient with the expression of frustration and anxiety. Reviewed patients self care plan. Assessed progress related to self care. Patients self care is fair. Recommend daily exercise, increased socialization and recreation. Used CBT to assist patient with the identification of negative distortions and irrational thoughts. Encouraged patient to verbalize alternative and factual responses which challenge thought distortions. Used motivational interviewing to assist and encourage patient through the change process. Explored patients barriers to change.   Plan: Return again in six  weeks.  Diagnosis: Axis I: bi polar    Axis II: No diagnosis    Mearl Olver, LCSW 05/02/2013

## 2013-05-03 DIAGNOSIS — Z23 Encounter for immunization: Secondary | ICD-10-CM | POA: Diagnosis not present

## 2013-05-12 ENCOUNTER — Other Ambulatory Visit (HOSPITAL_COMMUNITY): Payer: Self-pay | Admitting: Psychiatry

## 2013-05-12 DIAGNOSIS — F319 Bipolar disorder, unspecified: Secondary | ICD-10-CM

## 2013-05-16 ENCOUNTER — Telehealth (HOSPITAL_COMMUNITY): Payer: Self-pay | Admitting: *Deleted

## 2013-05-16 NOTE — Telephone Encounter (Signed)
Pt left message-Requested 90 day refill thru [facility] for Abilify and Fenofibrate

## 2013-05-17 DIAGNOSIS — N183 Chronic kidney disease, stage 3 unspecified: Secondary | ICD-10-CM | POA: Diagnosis not present

## 2013-05-20 ENCOUNTER — Ambulatory Visit (INDEPENDENT_AMBULATORY_CARE_PROVIDER_SITE_OTHER): Payer: Medicare Other | Admitting: Psychiatry

## 2013-05-20 ENCOUNTER — Encounter (HOSPITAL_COMMUNITY): Payer: Self-pay | Admitting: Psychiatry

## 2013-05-20 VITALS — BP 147/92 | HR 71 | Ht 72.0 in | Wt 259.9 lb

## 2013-05-20 DIAGNOSIS — F29 Unspecified psychosis not due to a substance or known physiological condition: Secondary | ICD-10-CM

## 2013-05-20 DIAGNOSIS — F329 Major depressive disorder, single episode, unspecified: Secondary | ICD-10-CM | POA: Diagnosis not present

## 2013-05-20 DIAGNOSIS — F319 Bipolar disorder, unspecified: Secondary | ICD-10-CM

## 2013-05-20 MED ORDER — ESCITALOPRAM OXALATE 10 MG PO TABS: 10.0000 mg | ORAL_TABLET | Freq: Every day | ORAL | Status: DC

## 2013-05-20 MED ORDER — ARIPIPRAZOLE 5 MG PO TABS: 5.0000 mg | ORAL_TABLET | Freq: Every day | ORAL | Status: DC

## 2013-05-20 NOTE — Progress Notes (Addendum)
[facility] [id number] Progress Note  Fernando Carr [id number] 72 y.o.  05/20/2013 11:15 AM  Chief Complaint:  Medication management and followup.      History of Present Illness: Patient is a 72 year old Caucasian male who came for his followup appointment.  Patient is now taking Abilify 5 mg and Lexapro 10 mg.  Patient seen some improvement with increased Abilify.  He is less irritable and less manic.  He endorse financial debt and not thinking to consolidate his bills.  He is relieved that his daughter is now back to school and working part-time.  She is also paying her own bills.  Patient is sleeping better.  He sometimes feels anxious and irritable but overall he is feeling much better with Abilify.  He denies any tremors or shakes.  He denies any anger or any mood swing.  He is not drinking or using any illegal substance.  He wants to continue Lexapro 10 mg Abilify 5 mg daily.   Suicidal Ideation: No Plan Formed: No Patient has means to carry out plan: No  Homicidal Ideation: No Plan Formed: No Patient has means to carry out plan: No  Review of Systems: Psychiatric: Agitation: No Hallucination: No Depressed Mood: No Insomnia: No Hypersomnia: No Altered Concentration: No Feels Worthless: No Grandiose Ideas: No Belief In Special Powers: No New/Increased Substance Abuse: No Compulsions: No  Neurologic: Headache: No Seizure: No Paresthesias: No  Medical History:  Patient has a history of hyperlipidemia, obesity and recently started seeing Dr. Haywood Pao at [facility] physician.  He's also recommended to see nephrologist .    Alcohol and substance use history. Patient has a history of alcohol use in the past.  He has 2 DWI and his license was suspended.  Patient claims to be sober in recent years.  He does not use any illegal substance.  Psychosocial history. Patient is born and grew up in [place].  He was working as a Engineer, site and retired in 1992.  He has one  daughter who lives in [place].  Patient still has property in [place]  Outpatient Encounter Prescriptions as of 05/20/2013  Medication Sig Dispense Refill  . ARIPiprazole (ABILIFY) 5 MG tablet Take 1 tablet (5 mg total) by mouth daily.  30 tablet  90  . CIALIS 20 MG tablet       . escitalopram (LEXAPRO) 10 MG tablet Take 1 tablet (10 mg total) by mouth daily.  90 tablet  0  . fenofibrate micronized (LOFIBRA) 67 MG capsule       . simvastatin (ZOCOR) 20 MG tablet Take 10 mg by mouth daily.       . [DISCONTINUED] ARIPiprazole (ABILIFY) 5 MG tablet Take 1 tablet (5 mg total) by mouth daily.  30 tablet  0  . [DISCONTINUED] escitalopram (LEXAPRO) 10 MG tablet Take 10 mg by mouth daily.      . [DISCONTINUED] escitalopram (LEXAPRO) 20 MG tablet TAKE 1 TABLET DAILY  90 tablet  0   No facility-administered encounter medications on file as of 05/20/2013.   No results found for this or any previous visit (from the past 2160 hour(s)).  Past Psychiatric History/Hospitalization(s): Past psychiatric history  Patient has at least one psychiatric inpatient treatment in 2006 detail suicidal attempt. He injected Ethyline Glycol. 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 He was feeling hopeless helpless and having paranoid thoughts. He had lost property and having significant financial burden.   Anxiety: No Bipolar Disorder: No Depression: Yes Mania:  No Psychosis: Yes Schizophrenia: No Personality Disorder: No Hospitalization for psychiatric illness: Yes History of Electroconvulsive Shock Therapy: No Prior Suicide Attempts: Yes  Physical Exam: Constitutional:  BP 147/92  Pulse 71  Ht 6' (1.829 m)  Wt 259 lb 14.4 oz (117.89 kg)  BMI 35.24 kg/m2  General Appearance: alert, oriented, no acute distress, well nourished, obese and Maintain fair eye contact  Review of Systems  HENT: Negative.   Respiratory: Negative.   Cardiovascular: Negative.   Musculoskeletal:  Positive for joint pain.       Leg pain  Neurological: Negative.   Psychiatric/Behavioral: The patient is nervous/anxious.    Musculoskeletal: Strength & Muscle Tone: within normal limits Gait & Station: unsteady Patient leans: N/A  Psychiatric: Speech (describe rate, volume, coherence, spontaneity, and abnormalities if any): Coherent and normal volume and tone.    Thought Process (describe rate, content, abstract reasoning, and computation): Logical and goal directed.    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 Abdominal aneurysm without mention of rupture 12/20/2011  . Bipolar 1 disorder 10/24/2011  . Depression 10/10/2011  . Hyperlipemia 10/10/2011    Axis IV: Mild  Axis V: 60-65   Plan: I will continue Abilify 5 mg daily and Lexapro 10 mg daily.  Recommended to call us back if he has any questions or concerns.  [facility]  Followup in 3 months.  Yiselle Babich T., MD 05/20/2013   Addendum We received the blood work from his primary care physician, Dr. Nickolas Madrid.  His blood was drawn in May.  His WBC is 8.1, BUN 28, creatinine 1.45 his ALT and AST is normal.  His postural is 202, triglyceride 261, PSA 1.36, TSH 0.80.

## 2013-05-23 DIAGNOSIS — N179 Acute kidney failure, unspecified: Secondary | ICD-10-CM | POA: Diagnosis not present

## 2013-06-06 ENCOUNTER — Other Ambulatory Visit: Payer: Self-pay

## 2013-06-12 ENCOUNTER — Other Ambulatory Visit: Payer: Self-pay | Admitting: Nephrology

## 2013-06-12 ENCOUNTER — Ambulatory Visit
Admission: RE | Admit: 2013-06-12 | Discharge: 2013-06-12 | Disposition: A | Discharge status: Home / Self Care | Payer: Managed Care, Other (non HMO) | Source: Ambulatory Visit | Attending: Nephrology | Admitting: Nephrology

## 2013-06-12 DIAGNOSIS — R059 Cough, unspecified: Secondary | ICD-10-CM

## 2013-06-12 DIAGNOSIS — R05 Cough: Secondary | ICD-10-CM

## 2013-06-13 ENCOUNTER — Ambulatory Visit (HOSPITAL_COMMUNITY): Payer: Medicare Other | Admitting: Licensed Clinical Social Worker

## 2013-07-01 ENCOUNTER — Ambulatory Visit (INDEPENDENT_AMBULATORY_CARE_PROVIDER_SITE_OTHER): Payer: Medicare Other | Admitting: Licensed Clinical Social Worker

## 2013-07-01 DIAGNOSIS — F319 Bipolar disorder, unspecified: Secondary | ICD-10-CM | POA: Diagnosis not present

## 2013-07-01 DIAGNOSIS — F411 Generalized anxiety disorder: Secondary | ICD-10-CM | POA: Diagnosis not present

## 2013-07-01 NOTE — Progress Notes (Signed)
   THERAPIST PROGRESS NOTE  Session Time: 11:30am-12:00pm  Participation Level: Active  Behavioral Response: Well GroomedAlertEuthymic  Type of Therapy: Individual Therapy  Treatment Goals addressed: Coping  Interventions: CBT, Strength-based, Supportive and Reframing  Summary: Fernando Carr is a 72 y.o. male who presents with euthymic mood and bright affect. He reports that he has been doing well and that his mood has stabalized. He denies any further manic symptoms and denies any current depression. He does endorse stress related to the debt he has accumulated during his manic episode. He continues to assist others financially and setting reasonable boundaries is difficult for him. He is enjoying the holiday season. His sleep and appetite are wnl.   Suicidal/Homicidal: Nowithout intent/plan  Therapist Response: Assessed patients current functioning and reviewed progress. Reviewed coping strategies. Assessed patients safety and assisted in identifying protective factors.  Reviewed crisis plan with patient. Assisted patient with the expression of anxiety over money. Reviewed patients self care plan. Assessed progress related to self care. Patients self care is good. Recommend daily exercise, increased socialization and recreation. Used CBT to assist patient with the identification of negative distortions and irrational thoughts. Encouraged patient to verbalize alternative and factual responses which challenge thought distortions. Reviewed healthy boundaries and assertive communication. Used motivational interviewing to assist and encourage patient through the change process. Explored patients barriers to change.   Plan: Return again in six to eight weeks.  Diagnosis: Axis I: bi polar     Axis II: No diagnosis    Elanor Cale, LCSW 07/01/2013

## 2013-07-26 ENCOUNTER — Other Ambulatory Visit (HOSPITAL_COMMUNITY): Payer: Self-pay | Admitting: Psychiatry

## 2013-07-26 DIAGNOSIS — F319 Bipolar disorder, unspecified: Secondary | ICD-10-CM

## 2013-08-20 ENCOUNTER — Ambulatory Visit (INDEPENDENT_AMBULATORY_CARE_PROVIDER_SITE_OTHER): Payer: Medicare Other | Admitting: Psychiatry

## 2013-08-20 ENCOUNTER — Encounter (HOSPITAL_COMMUNITY): Payer: Self-pay | Admitting: Psychiatry

## 2013-08-20 VITALS — BP 114/78 | HR 71 | Ht 71.0 in | Wt 245.6 lb

## 2013-08-20 DIAGNOSIS — F319 Bipolar disorder, unspecified: Secondary | ICD-10-CM

## 2013-08-20 MED ORDER — ARIPIPRAZOLE 5 MG PO TABS: 5.0000 mg | ORAL_TABLET | Freq: Every day | ORAL | Status: DC

## 2013-08-20 NOTE — Progress Notes (Signed)
[place] Progress Note  Fernando Carr [id number] 73 y.o.  08/20/2013 10:15 AM  Chief Complaint:  I'm feeling more depressed and I cannot sleep.  I'm out of my Abilify.  I did not get a prescription from the pharmacy.        History of Present Illness: Fernando Carr came for his followup appointment.  He is complaining of increased anxiety , depression and unable to sleep.  He also endorses irritability anger and some mood swing.  He is feeling guilty because he cosigned his friend to help get an apartment and that friend is not paying the rent.  Patient endorsed that he did a mistake and he regrets .  He also endorsed that he is not getting Abilify from his pharmacy and he as been out for more than 4 weeks.  When I asked why he did not call us patient replied that he forgot to call us.  Patient is getting prescription from his mail in pharmacy .  He is compliant with Lexapro.  He endorsed that Abilify was helping his anger irritability sleep and depression.  Patient denies any suicidal thoughts but he is feeling more isolated and withdrawn.  He denies any paranoia or any hallucination.  He is not drinking or using any illicit substances.  Suicidal Ideation: No Plan Formed: No Patient has means to carry out plan: No  Homicidal Ideation: No Plan Formed: No Patient has means to carry out plan: No  Review of Systems: Psychiatric: Agitation: No Hallucination: No Depressed Mood: Yes Insomnia: Yes Hypersomnia: No Altered Concentration: No Feels Worthless: No Grandiose Ideas: No Belief In Special Powers: No New/Increased Substance Abuse: No Compulsions: No  Neurologic: Headache: No Seizure: No Paresthesias: No  Medical History:  Patient has a history of hyperlipidemia, obesity and recently started seeing Dr. Deforest Hoyles at [facility] physician.  He's also recommended to see nephrologist .  His blood was drawn in May.  His WBC is 8.1, BUN 28, creatinine 1.45 his ALT and AST is normal.   His cholesterol is 202, triglyceride 261, PSA 1.36, TSH 0.80.   Outpatient Encounter Prescriptions as of 08/20/2013  Medication Sig  . ARIPiprazole (ABILIFY) 5 MG tablet Take 1 tablet (5 mg total) by mouth daily.  Marland Kitchen CIALIS 20 MG tablet   . escitalopram (LEXAPRO) 10 MG tablet TAKE 1 TABLET DAILY  . fenofibrate micronized (LOFIBRA) 67 MG capsule   . simvastatin (ZOCOR) 20 MG tablet Take 10 mg by mouth daily.   . [DISCONTINUED] ARIPiprazole (ABILIFY) 5 MG tablet Take 1 tablet (5 mg total) by mouth daily.   No results found for this or any previous visit (from the past 2160 hour(s)).  Past Psychiatric History/Hospitalization(s): Past psychiatric history  Patient has at least one psychiatric inpatient treatment in 2006 detail suicidal attempt. He injected Ethyline Glycol. 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 He was feeling hopeless helpless and having paranoid thoughts. He had lost property and having significant financial burden.  Anxiety: No Bipolar Disorder: No Depression: Yes Mania: No Psychosis: Yes Schizophrenia: No Personality Disorder: No Hospitalization for psychiatric illness: Yes History of Electroconvulsive Shock Therapy: No Prior Suicide Attempts: Yes  Physical Exam: Constitutional:  BP 114/78  Pulse 71  Ht 5\' 11"  (1.803 m)  Wt 245 lb 9.6 oz (111.403 kg)  BMI 34.27 kg/m2  General Appearance: alert, oriented, no acute distress, well nourished, obese and Maintain fair eye contact  Review of Systems  Constitutional: Positive for malaise/fatigue.  HENT: Negative.   Respiratory: Negative.   Cardiovascular: Negative.   Musculoskeletal: Positive for joint pain.       Leg pain  Neurological: Negative.   Psychiatric/Behavioral: Positive for depression. The patient is nervous/anxious.    Musculoskeletal: Strength & Muscle Tone: within normal limits Gait & Station: unsteady Patient leans: N/A  Psychiatric: Speech (describe rate, volume,  coherence, spontaneity, and abnormalities if any): Coherent and normal volume and tone.    Thought Process (describe rate, content, abstract reasoning, and computation): Logical and goal directed.    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 Abdominal aneurysm without mention of rupture 12/20/2011  . Bipolar 1 disorder 10/24/2011  . Depression 10/10/2011  . Hyperlipemia 10/10/2011    Axis IV: Mild  Axis V: 60-65   Plan: I reinforce medication compliance in recommended to call us back if he has ever problem getting the medication.  I will provide at 30 day prescription for the local pharmacy.  Followup in 3 weeks.  Recommended to seek a therapist for coping and social skills.  Continue Lexapro at present dose patient has refill remaining on his Lexapro. Discuss safety plan that anytime having active suicidal thoughts or homicidal thoughts then patient need to call 911 or go to the local emergency room.  Gurleen Larrivee T., MD 08/20/2013

## 2013-08-26 ENCOUNTER — Ambulatory Visit (INDEPENDENT_AMBULATORY_CARE_PROVIDER_SITE_OTHER): Payer: Medicare Other | Admitting: Licensed Clinical Social Worker

## 2013-08-26 DIAGNOSIS — F319 Bipolar disorder, unspecified: Secondary | ICD-10-CM

## 2013-08-26 NOTE — Progress Notes (Signed)
   THERAPIST PROGRESS NOTE  Session Time: 10:30am-11:20am  Participation Level: Active  Behavioral Response: Well GroomedAlertDepressed  Type of Therapy: Individual Therapy  Treatment Goals addressed: Coping  Interventions: CBT, Motivational Interviewing, Strength-based, Assertiveness Training, Supportive and Reframing  Summary: Fernando Carr is a 73 y.o. male who presents with depressed mood and anxious affect. He reports feeling "down" and frustrated with himself. He is frustrated that during his manic episode he purchased another car and signed off on a rental agreement which he knows the woman will not pay on. He processes his frustration and explores his ongoing struggle to set limits. His sleep and appetite are wnl. Informed patient of this writers departure and discussed transfer.    Suicidal/Homicidal: Nowithout intent/plan  Therapist Response: Assessed patients current functioning and reviewed progress. Reviewed coping strategies. Assessed patients safety and assisted in identifying protective factors.  Reviewed crisis plan with patient. Assisted patient with the expression of frustration with himself. Reviewed patients self care plan. Assessed progress related to self care. Patients self care is fair. Recommend daily exercise, increased socialization and recreation. Reviewed healthy boundaries and assertive communication. Used CBT to assist patient with the identification of negative distortions and irrational thoughts. Encouraged patient to verbalize alternative and factual responses which challenge thought distortions.   Plan: Referred to [place] behavioral medicine.   Diagnosis: Axis I: Bipolar, Depressed    Axis II: No diagnosis    Elly Haffey, LCSW 08/26/2013

## 2013-09-10 ENCOUNTER — Ambulatory Visit (INDEPENDENT_AMBULATORY_CARE_PROVIDER_SITE_OTHER): Payer: Medicare Other | Admitting: Psychiatry

## 2013-09-10 ENCOUNTER — Encounter (HOSPITAL_COMMUNITY): Payer: Self-pay | Admitting: Psychiatry

## 2013-09-10 VITALS — BP 121/72 | HR 63 | Ht 72.0 in | Wt 253.0 lb

## 2013-09-10 DIAGNOSIS — F323 Major depressive disorder, single episode, severe with psychotic features: Secondary | ICD-10-CM | POA: Diagnosis not present

## 2013-09-10 DIAGNOSIS — F319 Bipolar disorder, unspecified: Secondary | ICD-10-CM

## 2013-09-10 MED ORDER — ESCITALOPRAM OXALATE 10 MG PO TABS: ORAL_TABLET | ORAL | Status: DC

## 2013-09-10 MED ORDER — ARIPIPRAZOLE 5 MG PO TABS: 5.0000 mg | ORAL_TABLET | Freq: Every day | ORAL | Status: DC

## 2013-09-10 NOTE — Progress Notes (Signed)
[place] [phone] Progress Note  Fernando Carr [id number] 73 y.o.  09/10/2013 10:39 AM  Chief Complaint:  Medication management and followup.          History of Present Illness: Fernando Carr came for his followup appointment.  He is now taking Abilify and feeling much better.  He is sleeping good.  He denies any irritability , anger or any mood swing.  He likes the Abilify.  He has no tremors or shakes.  He [facility] because it is [facility].  He is relief because his friend is paying the rent.  Patient has cosigned his lease and he was very limited because he was not paying the rent.  Overall he is feeling much better with the medication.  He wants to continue Lexapro and Abilify.  Denies any paranoia, hallucination, suicidal thoughts or homicidal thoughts.  He is more active and social.  He is not drinking or using any illegal substances.  Patient will see a new therapist because his previous therapist is leaving the practice.  Suicidal Ideation: No Plan Formed: No Patient has means to carry out plan: No  Homicidal Ideation: No Plan Formed: No Patient has means to carry out plan: No  Review of Systems: Psychiatric: Agitation: No Hallucination: No Depressed Mood: No Insomnia: No Hypersomnia: No Altered Concentration: No Feels Worthless: No Grandiose Ideas: No Belief In Special Powers: No New/Increased Substance Abuse: No Compulsions: No  Neurologic: Headache: No Seizure: No Paresthesias: No  Medical History:  Patient has a history of hyperlipidemia, obesity and recently started seeing Dr. Deforest Hoyles at [facility] physician.  He's also recommended to see nephrologist .  His blood was drawn in May.  His WBC is 8.1, BUN 28, creatinine 1.45 his ALT and AST is normal.  His cholesterol is 202, triglyceride 261, PSA 1.36, TSH 0.80.   Outpatient Encounter Prescriptions as of 09/10/2013  Medication Sig  . ARIPiprazole (ABILIFY) 5 MG tablet Take 1  tablet (5 mg total) by mouth daily.  Marland Kitchen CIALIS 20 MG tablet   . escitalopram (LEXAPRO) 10 MG tablet TAKE 1 TABLET DAILY  . fenofibrate 54 MG tablet   . fenofibrate micronized (LOFIBRA) 67 MG capsule   . simvastatin (ZOCOR) 20 MG tablet Take 10 mg by mouth daily.   . [DISCONTINUED] ARIPiprazole (ABILIFY) 5 MG tablet Take 1 tablet (5 mg total) by mouth daily.  . [DISCONTINUED] escitalopram (LEXAPRO) 10 MG tablet TAKE 1 TABLET DAILY   No results found for this or any previous visit (from the past 2160 hour(s)).  Past Psychiatric History/Hospitalization(s): Past psychiatric history  Patient has at least one psychiatric inpatient treatment in 2006 detail suicidal attempt. He injected Ethyline Glycol. 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 He was feeling hopeless helpless and having paranoid thoughts. He had lost property and having significant financial burden.  Anxiety: No Bipolar Disorder: No Depression: Yes Mania: No Psychosis: Yes Schizophrenia: No Personality Disorder: No Hospitalization for psychiatric illness: Yes History of Electroconvulsive Shock Therapy: No Prior Suicide Attempts: Yes  Physical Exam: Constitutional:  BP 121/72  Pulse 63  Ht 6' (1.829 m)  Wt 253 lb (114.76 kg)  BMI 34.31 kg/m2  General Appearance: alert, oriented, no acute distress, well nourished, obese and Maintain fair eye contact  Review of Systems  Musculoskeletal:       Leg pain  Neurological: Negative.    Musculoskeletal: Strength & Muscle Tone: within normal limits Gait & Station: unsteady  Patient leans: N/A  Psychiatric: Speech (describe rate, volume, coherence, spontaneity, and abnormalities if any): Coherent and normal volume and tone.    Thought Process (describe rate, content, abstract reasoning, and computation): Logical and goal directed.    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 Abdominal aneurysm without mention of rupture 12/20/2011  . Bipolar 1 disorder 10/24/2011  . Depression 10/10/2011  . Hyperlipemia 10/10/2011    Axis IV: Mild  Axis V: 60-65   Plan: I reinforce medication compliance.  Continue Abilify 5 mg daily and Lexapro at present dose.  Discussed risks and benefits of medication. New prescription of 90 days for both medication is [facility]  Followup in 3 months.  Patient will follow up with a new therapist since his previous therapist is leaving the practice.  Recommend to call us back if he has any question or any concern.  Fernando Carr T., MD 09/10/2013

## 2013-09-13 ENCOUNTER — Ambulatory Visit (INDEPENDENT_AMBULATORY_CARE_PROVIDER_SITE_OTHER): Payer: Medicare Other | Admitting: Licensed Clinical Social Worker

## 2013-09-13 DIAGNOSIS — F3189 Other bipolar disorder: Secondary | ICD-10-CM | POA: Diagnosis not present

## 2013-09-20 DIAGNOSIS — N183 Chronic kidney disease, stage 3 unspecified: Secondary | ICD-10-CM | POA: Diagnosis not present

## 2013-09-20 DIAGNOSIS — F17200 Nicotine dependence, unspecified, uncomplicated: Secondary | ICD-10-CM | POA: Diagnosis not present

## 2013-09-20 DIAGNOSIS — F329 Major depressive disorder, single episode, unspecified: Secondary | ICD-10-CM | POA: Diagnosis not present

## 2013-09-20 DIAGNOSIS — E782 Mixed hyperlipidemia: Secondary | ICD-10-CM | POA: Diagnosis not present

## 2013-10-12 ENCOUNTER — Other Ambulatory Visit (HOSPITAL_COMMUNITY): Payer: Self-pay | Admitting: Psychiatry

## 2013-10-12 DIAGNOSIS — F319 Bipolar disorder, unspecified: Secondary | ICD-10-CM

## 2013-11-26 ENCOUNTER — Other Ambulatory Visit (HOSPITAL_COMMUNITY): Payer: Self-pay | Admitting: Psychiatry

## 2013-11-26 DIAGNOSIS — F319 Bipolar disorder, unspecified: Secondary | ICD-10-CM

## 2013-12-09 ENCOUNTER — Ambulatory Visit (INDEPENDENT_AMBULATORY_CARE_PROVIDER_SITE_OTHER): Payer: Medicare Other | Admitting: Psychiatry

## 2013-12-09 ENCOUNTER — Encounter (HOSPITAL_COMMUNITY): Payer: Self-pay | Admitting: Psychiatry

## 2013-12-09 VITALS — BP 126/76 | HR 65 | Wt 238.0 lb

## 2013-12-09 DIAGNOSIS — F319 Bipolar disorder, unspecified: Secondary | ICD-10-CM | POA: Diagnosis not present

## 2013-12-09 MED ORDER — RISPERIDONE 1 MG PO TABS: 1.0000 mg | ORAL_TABLET | Freq: Every day | ORAL | Status: DC

## 2013-12-09 NOTE — Progress Notes (Signed)
[facility] Progress Note  Fernando Carr [id number] 73 y.o.  12/09/2013 11:33 AM  Chief Complaint:  I am feeling more anxious depressed and irritable.  I don't think Abilify is working for me.            History of Present Illness: Fernando Carr came for his followup appointment.  He is complaining of more irritability anger and depressive symptoms.  He admitted spending money on his daughter.  He sold his stocks.  He endorse that his daughter is pregnant and requires money.  Patient endorsed financial strain.  He also endorsed lately more depressed and having passive suicidal thoughts but no plan.  He is sleeping good.  He admitted irritability , mood swings and anger but denied any hallucination or any paranoia.  He also endorsed tremors from Abilify .  He is compliant with Abilify and Lexapro.  He tried to see a different therapist but he was not happy with him.  Patient denies any drinking or using any illegal substances.  He scheduled to see his primary care physician a few weeks for annual physical and checkup.  Patient is experiencing worsening of the symptoms and sometimes feels hopeless and worthless.  He denies any aggression or violence.  We talked about switching back to Risperdal which had helped him in the past.  He lost this weight.  His vitals are stable.  He is concerned about his weight which was increased when risperidone was given.  However I believe at that time he was in cruthches and he was unable to move and has gained weight.   Suicidal Ideation: Patient endorsed passive suicidal thoughts Plan Formed: No Patient has means to carry out plan: No  Homicidal Ideation: No Plan Formed: No Patient has means to carry out plan: No  Review of Systems: Psychiatric: Agitation: Yes Hallucination: No Depressed Mood: Yes Insomnia: No Hypersomnia: No Altered Concentration: No Feels Worthless: Yes Grandiose Ideas: No Belief In Special Powers: No New/Increased Substance  Abuse: No Compulsions: No  Neurologic: Headache: No Seizure: No Paresthesias: No  Medical History:  Patient has a history of hyperlipidemia, obesity and recently started seeing Dr. Deforest Hoyles at [facility].    Outpatient Encounter Prescriptions as of 12/09/2013  Medication Sig  . CIALIS 20 MG tablet   . escitalopram (LEXAPRO) 10 MG tablet TAKE 1 TABLET DAILY  . fenofibrate 54 MG tablet   . fenofibrate micronized (LOFIBRA) 67 MG capsule   . risperiDONE (RISPERDAL) 1 MG tablet Take 1 tablet (1 mg total) by mouth at bedtime.  . simvastatin (ZOCOR) 20 MG tablet Take 10 mg by mouth daily.   . [DISCONTINUED] ABILIFY 5 MG tablet TAKE 1 TABLET DAILY   No results found for this or any previous visit (from the past 2160 hour(s)).  Past Psychiatric History/Hospitalization(s): Past psychiatric history  Patient has at least one psychiatric inpatient treatment in 2006 detail suicidal attempt. He injected Ethyline Glycol. 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 He was feeling hopeless helpless and having paranoid thoughts. He had lost property and having significant financial burden.  Anxiety: No Bipolar Disorder: No Depression: Yes Mania: No Psychosis: Yes Schizophrenia: No Personality Disorder: No Hospitalization for psychiatric illness: Yes History of Electroconvulsive Shock Therapy: No Prior Suicide Attempts: Yes  Physical Exam: Constitutional:  BP 126/76  Pulse 65  Wt 238 lb (107.956 kg)  General Appearance: alert, oriented, no acute distress, well nourished, obese and Maintain fair eye contact  Review of Systems  Musculoskeletal:       Leg pain  Neurological: Negative.    Musculoskeletal: Strength & Muscle Tone: within normal limits Gait & Station: unsteady Patient leans: N/A  Psychiatric: Speech (describe rate, volume, coherence, spontaneity, and abnormalities if any): Coherent and normal volume and tone.    Thought Process (describe  rate, content, abstract reasoning, and computation): Logical and goal directed.    Associations: Relevant, patient denies any auditory or visual hallucinations he  Thoughts: normal  Mental Status: Orientation: oriented to person, place, time/date and situation Mood & Affect: depressed affect Attention Span & Concentration: Fair,   Established Problem, Stable/Improving (1), Review of Psycho-Social Stressors (1), Established Problem, Worsening (2), Review of Last Therapy Session (1), Review of Medication Regimen & Side Effects (2) and Review of New Medication or Change in Dosage (2)  Assessment: Axis I: Bipolar disorder type I   Axis II: Deferred  Axis III:  Patient Active Problem List   Diagnosis Date Noted  . Abdominal aneurysm without mention of rupture 12/20/2011  . Bipolar 1 disorder 10/24/2011  . Depression 10/10/2011  . Hyperlipemia 10/10/2011    Axis IV: Mild  Axis V: 60-65   Plan: Discuss in detail about his current psychotropic medication.  Recommended to discontinue Abilify which is not helping him and they have causing tremors.  Restart risperidone 1 mg at bedtime.  We will provide a 30 day prescription to see the efficacy of medication. I will closely monitor his weight and vitals on his next appointment.   I will also schedule to see therapist in this office for counseling.  Continue Lexapro at present dose.  Recommended to call us back if he has any question or any concern.  Followup in 4 weeks.Time spent 25 minutes.  More than 50% of the time spent in psychoeducation, counseling and coordination of care.  Discuss safety plan that anytime having active suicidal thoughts or homicidal thoughts then patient need to call 911 or go to the local emergency room.  Fernando Carr T., MD 12/09/2013

## 2014-01-07 ENCOUNTER — Other Ambulatory Visit: Payer: Self-pay | Admitting: Internal Medicine

## 2014-01-07 DIAGNOSIS — E782 Mixed hyperlipidemia: Secondary | ICD-10-CM | POA: Diagnosis not present

## 2014-01-07 DIAGNOSIS — Z0000 Encounter for general adult medical examination without abnormal findings: Secondary | ICD-10-CM | POA: Diagnosis not present

## 2014-01-07 DIAGNOSIS — Z1331 Encounter for screening for depression: Secondary | ICD-10-CM | POA: Diagnosis not present

## 2014-01-07 DIAGNOSIS — F329 Major depressive disorder, single episode, unspecified: Secondary | ICD-10-CM | POA: Diagnosis not present

## 2014-01-07 DIAGNOSIS — F17200 Nicotine dependence, unspecified, uncomplicated: Secondary | ICD-10-CM | POA: Diagnosis not present

## 2014-01-07 DIAGNOSIS — I714 Abdominal aortic aneurysm, without rupture, unspecified: Secondary | ICD-10-CM | POA: Diagnosis not present

## 2014-01-07 DIAGNOSIS — R7309 Other abnormal glucose: Secondary | ICD-10-CM | POA: Diagnosis not present

## 2014-01-07 DIAGNOSIS — Z23 Encounter for immunization: Secondary | ICD-10-CM | POA: Diagnosis not present

## 2014-01-07 DIAGNOSIS — N183 Chronic kidney disease, stage 3 unspecified: Secondary | ICD-10-CM | POA: Diagnosis not present

## 2014-01-08 ENCOUNTER — Other Ambulatory Visit (HOSPITAL_COMMUNITY): Payer: Self-pay | Admitting: Psychiatry

## 2014-01-09 ENCOUNTER — Ambulatory Visit (INDEPENDENT_AMBULATORY_CARE_PROVIDER_SITE_OTHER): Payer: Medicare Other | Admitting: Psychiatry

## 2014-01-09 ENCOUNTER — Other Ambulatory Visit (HOSPITAL_COMMUNITY): Payer: Self-pay

## 2014-01-09 ENCOUNTER — Encounter (HOSPITAL_COMMUNITY): Payer: Self-pay | Admitting: Psychiatry

## 2014-01-09 VITALS — BP 102/61 | HR 68 | Ht 70.5 in | Wt 228.8 lb

## 2014-01-09 DIAGNOSIS — N183 Chronic kidney disease, stage 3 unspecified: Secondary | ICD-10-CM | POA: Insufficient documentation

## 2014-01-09 DIAGNOSIS — N189 Chronic kidney disease, unspecified: Secondary | ICD-10-CM

## 2014-01-09 DIAGNOSIS — F319 Bipolar disorder, unspecified: Secondary | ICD-10-CM | POA: Diagnosis not present

## 2014-01-09 MED ORDER — RISPERIDONE 1 MG PO TABS: 1.0000 mg | ORAL_TABLET | Freq: Every day | ORAL | Status: DC

## 2014-01-09 MED ORDER — ESCITALOPRAM OXALATE 10 MG PO TABS: ORAL_TABLET | ORAL | Status: DC
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
## 2014-01-21 DIAGNOSIS — N183 Chronic kidney disease, stage 3 unspecified: Secondary | ICD-10-CM | POA: Diagnosis not present

## 2014-01-21 DIAGNOSIS — I959 Hypotension, unspecified: Secondary | ICD-10-CM | POA: Diagnosis not present

## 2014-02-10 DIAGNOSIS — Z86010 Personal history of colonic polyps: Secondary | ICD-10-CM | POA: Diagnosis not present

## 2014-02-10 DIAGNOSIS — Z8371 Family history of colonic polyps: Secondary | ICD-10-CM | POA: Diagnosis not present

## 2014-02-10 DIAGNOSIS — E785 Hyperlipidemia, unspecified: Secondary | ICD-10-CM | POA: Diagnosis not present

## 2014-02-18 DIAGNOSIS — D126 Benign neoplasm of colon, unspecified: Secondary | ICD-10-CM | POA: Diagnosis not present

## 2014-02-18 DIAGNOSIS — Z1211 Encounter for screening for malignant neoplasm of colon: Secondary | ICD-10-CM | POA: Diagnosis not present

## 2014-02-18 DIAGNOSIS — Z86010 Personal history of colonic polyps: Secondary | ICD-10-CM | POA: Diagnosis not present

## 2014-02-18 DIAGNOSIS — K5520 Angiodysplasia of colon without hemorrhage: Secondary | ICD-10-CM | POA: Diagnosis not present

## 2014-02-23 ENCOUNTER — Other Ambulatory Visit (HOSPITAL_COMMUNITY): Payer: Self-pay | Admitting: Psychiatry

## 2014-02-25 ENCOUNTER — Other Ambulatory Visit (HOSPITAL_COMMUNITY): Payer: Self-pay | Admitting: Psychiatry

## 2014-02-25 NOTE — Telephone Encounter (Signed)
He is not taking abilify any more.

## 2014-03-09 ENCOUNTER — Emergency Department (HOSPITAL_COMMUNITY): Payer: Medicare Other

## 2014-03-09 ENCOUNTER — Inpatient Hospital Stay (HOSPITAL_COMMUNITY)
Admission: EM | Admit: 2014-03-09 | Discharge: 2014-03-11 | DRG: 300 | Disposition: A | Discharge status: Home Health | Payer: Medicare Other | Attending: Internal Medicine | Admitting: Internal Medicine

## 2014-03-09 ENCOUNTER — Encounter (HOSPITAL_COMMUNITY): Payer: Self-pay | Admitting: Emergency Medicine

## 2014-03-09 DIAGNOSIS — I714 Abdominal aortic aneurysm, without rupture, unspecified: Secondary | ICD-10-CM | POA: Diagnosis present

## 2014-03-09 DIAGNOSIS — I498 Other specified cardiac arrhythmias: Secondary | ICD-10-CM | POA: Diagnosis present

## 2014-03-09 DIAGNOSIS — I4891 Unspecified atrial fibrillation: Secondary | ICD-10-CM

## 2014-03-09 DIAGNOSIS — E785 Hyperlipidemia, unspecified: Secondary | ICD-10-CM | POA: Diagnosis present

## 2014-03-09 DIAGNOSIS — N179 Acute kidney failure, unspecified: Secondary | ICD-10-CM

## 2014-03-09 DIAGNOSIS — E860 Dehydration: Secondary | ICD-10-CM | POA: Diagnosis present

## 2014-03-09 DIAGNOSIS — I5189 Other ill-defined heart diseases: Secondary | ICD-10-CM | POA: Diagnosis present

## 2014-03-09 DIAGNOSIS — I959 Hypotension, unspecified: Secondary | ICD-10-CM | POA: Diagnosis not present

## 2014-03-09 DIAGNOSIS — F329 Major depressive disorder, single episode, unspecified: Secondary | ICD-10-CM | POA: Diagnosis present

## 2014-03-09 DIAGNOSIS — E7800 Pure hypercholesterolemia, unspecified: Secondary | ICD-10-CM | POA: Diagnosis present

## 2014-03-09 DIAGNOSIS — I951 Orthostatic hypotension: Secondary | ICD-10-CM | POA: Diagnosis present

## 2014-03-09 DIAGNOSIS — I519 Heart disease, unspecified: Secondary | ICD-10-CM | POA: Diagnosis not present

## 2014-03-09 DIAGNOSIS — Z6830 Body mass index (BMI) 30.0-30.9, adult: Secondary | ICD-10-CM

## 2014-03-09 DIAGNOSIS — F17200 Nicotine dependence, unspecified, uncomplicated: Secondary | ICD-10-CM | POA: Diagnosis present

## 2014-03-09 DIAGNOSIS — R627 Adult failure to thrive: Secondary | ICD-10-CM | POA: Diagnosis present

## 2014-03-09 DIAGNOSIS — I480 Paroxysmal atrial fibrillation: Secondary | ICD-10-CM | POA: Diagnosis present

## 2014-03-09 DIAGNOSIS — D3500 Benign neoplasm of unspecified adrenal gland: Secondary | ICD-10-CM | POA: Diagnosis not present

## 2014-03-09 DIAGNOSIS — R946 Abnormal results of thyroid function studies: Secondary | ICD-10-CM | POA: Diagnosis present

## 2014-03-09 DIAGNOSIS — R42 Dizziness and giddiness: Secondary | ICD-10-CM | POA: Insufficient documentation

## 2014-03-09 DIAGNOSIS — I158 Other secondary hypertension: Secondary | ICD-10-CM | POA: Diagnosis present

## 2014-03-09 DIAGNOSIS — I10 Essential (primary) hypertension: Secondary | ICD-10-CM | POA: Diagnosis present

## 2014-03-09 DIAGNOSIS — N183 Chronic kidney disease, stage 3 unspecified: Secondary | ICD-10-CM | POA: Diagnosis present

## 2014-03-09 DIAGNOSIS — F319 Bipolar disorder, unspecified: Secondary | ICD-10-CM | POA: Diagnosis present

## 2014-03-09 DIAGNOSIS — F32A Depression, unspecified: Secondary | ICD-10-CM | POA: Diagnosis present

## 2014-03-09 DIAGNOSIS — I7772 Dissection of iliac artery: Secondary | ICD-10-CM | POA: Diagnosis not present

## 2014-03-09 DIAGNOSIS — Z79899 Other long term (current) drug therapy: Secondary | ICD-10-CM

## 2014-03-09 DIAGNOSIS — R634 Abnormal weight loss: Secondary | ICD-10-CM | POA: Diagnosis present

## 2014-03-09 DIAGNOSIS — I723 Aneurysm of iliac artery: Secondary | ICD-10-CM | POA: Diagnosis not present

## 2014-03-09 DIAGNOSIS — I712 Thoracic aortic aneurysm, without rupture, unspecified: Secondary | ICD-10-CM | POA: Diagnosis not present

## 2014-03-09 DIAGNOSIS — R001 Bradycardia, unspecified: Secondary | ICD-10-CM | POA: Diagnosis present

## 2014-03-09 DIAGNOSIS — R404 Transient alteration of awareness: Secondary | ICD-10-CM | POA: Diagnosis not present

## 2014-03-09 LAB — COMPREHENSIVE METABOLIC PANEL
ALT: 13 U/L (ref 0–53)
AST: 10 U/L (ref 0–37)
Albumin: 3.6 g/dL (ref 3.5–5.2)
Alkaline Phosphatase: 87 U/L (ref 39–117)
Anion gap: 14 (ref 5–15)
BUN: 24 mg/dL — ABNORMAL HIGH (ref 6–23)
CO2: 23 mEq/L (ref 19–32)
Calcium: 10.9 mg/dL — ABNORMAL HIGH (ref 8.4–10.5)
Chloride: 104 mEq/L (ref 96–112)
Creatinine, Ser: 1.75 mg/dL — ABNORMAL HIGH (ref 0.50–1.35)
GFR calc Af Amer: 43 mL/min — ABNORMAL LOW (ref >90)
GFR calc non Af Amer: 37 mL/min — ABNORMAL LOW (ref >90)
Glucose, Bld: 111 mg/dL — ABNORMAL HIGH (ref 70–99)
Potassium: 4.1 mEq/L (ref 3.7–5.3)
Sodium: 141 mEq/L (ref 137–147)
Total Bilirubin: 0.3 mg/dL (ref 0.3–1.2)
Total Protein: 6.7 g/dL (ref 6.0–8.3)

## 2014-03-09 LAB — URINE MICROSCOPIC-ADD ON

## 2014-03-09 LAB — CBC WITH DIFFERENTIAL/PLATELET
Basophils Absolute: 0 10*3/uL (ref 0.0–0.1)
Basophils Relative: 0 % (ref 0–1)
Eosinophils Absolute: 0 10*3/uL (ref 0.0–0.7)
Eosinophils Relative: 0 % (ref 0–5)
HCT: 40.1 % (ref 39.0–52.0)
Hemoglobin: 13.3 g/dL (ref 13.0–17.0)
Lymphocytes Relative: 15 % (ref 12–46)
Lymphs Abs: 1.2 10*3/uL (ref 0.7–4.0)
MCH: 29.6 pg (ref 26.0–34.0)
MCHC: 33.2 g/dL (ref 30.0–36.0)
MCV: 89.3 fL (ref 78.0–100.0)
Monocytes Absolute: 0.5 10*3/uL (ref 0.1–1.0)
Monocytes Relative: 6 % (ref 3–12)
Neutro Abs: 5.8 10*3/uL (ref 1.7–7.7)
Neutrophils Relative %: 79 % — ABNORMAL HIGH (ref 43–77)
Platelets: 150 10*3/uL (ref 150–400)
RBC: 4.49 MIL/uL (ref 4.22–5.81)
RDW: 13 % (ref 11.5–15.5)
WBC: 7.5 10*3/uL (ref 4.0–10.5)

## 2014-03-09 LAB — I-STAT TROPONIN, ED: Troponin i, poc: 0.01 ng/mL (ref 0.00–0.08)

## 2014-03-09 LAB — URINALYSIS, ROUTINE W REFLEX MICROSCOPIC
Bilirubin Urine: NEGATIVE
Glucose, UA: NEGATIVE mg/dL
Hgb urine dipstick: NEGATIVE
Ketones, ur: NEGATIVE mg/dL
Leukocytes, UA: NEGATIVE
Nitrite: NEGATIVE
Protein, ur: 100 mg/dL — AB
Specific Gravity, Urine: 1.019 (ref 1.005–1.030)
Urobilinogen, UA: 2 mg/dL — ABNORMAL HIGH (ref 0.0–1.0)
pH: 7.5 (ref 5.0–8.0)

## 2014-03-09 LAB — MRSA PCR SCREENING: MRSA by PCR: NEGATIVE

## 2014-03-09 LAB — I-STAT CG4 LACTIC ACID, ED: Lactic Acid, Venous: 2.17 mmol/L (ref 0.5–2.2)

## 2014-03-09 MED ORDER — FENOFIBRATE 54 MG PO TABS
54.0000 mg | ORAL_TABLET | Freq: Every day | ORAL | Status: DC
  Administered 2014-03-09 – 2014-03-11 (×3): 54 mg via ORAL
  Filled 2014-03-09 (×3): qty 1

## 2014-03-09 MED ORDER — HYDRALAZINE HCL 20 MG/ML IJ SOLN
10.0000 mg | INTRAMUSCULAR | Status: DC | PRN
  Administered 2014-03-09 – 2014-03-10 (×2): 10 mg via INTRAVENOUS
  Filled 2014-03-09 (×2): qty 1

## 2014-03-09 MED ORDER — SODIUM CHLORIDE 0.9 % IJ SOLN
3.0000 mL | Freq: Two times a day (BID) | INTRAMUSCULAR | Status: DC
  Administered 2014-03-09 – 2014-03-11 (×4): 3 mL via INTRAVENOUS

## 2014-03-09 MED ORDER — SODIUM CHLORIDE 0.9 % IV BOLUS (SEPSIS)
1000.0000 mL | Freq: Once | INTRAVENOUS | Status: AC
  Administered 2014-03-09: 1000 mL via INTRAVENOUS

## 2014-03-09 MED ORDER — IOHEXOL 350 MG/ML SOLN
100.0000 mL | Freq: Once | INTRAVENOUS | Status: AC | PRN
  Administered 2014-03-09: 100 mL via INTRAVENOUS

## 2014-03-09 MED ORDER — HYDRALAZINE HCL 20 MG/ML IJ SOLN
INTRAMUSCULAR | Status: AC
  Filled 2014-03-09: qty 1

## 2014-03-09 MED ORDER — RISPERIDONE 1 MG PO TABS
1.0000 mg | ORAL_TABLET | Freq: Every day | ORAL | Status: DC
  Administered 2014-03-09 – 2014-03-10 (×2): 1 mg via ORAL
  Filled 2014-03-09 (×4): qty 1

## 2014-03-09 MED ORDER — SODIUM CHLORIDE 0.9 % IV SOLN
INTRAVENOUS | Status: AC
  Administered 2014-03-09: 19:00:00 via INTRAVENOUS

## 2014-03-09 MED ORDER — SIMVASTATIN 10 MG PO TABS
10.0000 mg | ORAL_TABLET | Freq: Every day | ORAL | Status: DC
Start: 2014-03-09 — End: 2014-03-11
  Administered 2014-03-09 – 2014-03-10 (×2): 10 mg via ORAL
  Filled 2014-03-09 (×4): qty 1

## 2014-03-09 MED ORDER — ESCITALOPRAM OXALATE 10 MG PO TABS
10.0000 mg | ORAL_TABLET | Freq: Every day | ORAL | Status: DC
  Administered 2014-03-10 – 2014-03-11 (×2): 10 mg via ORAL
  Filled 2014-03-09 (×3): qty 1

## 2014-03-09 NOTE — ED Notes (Signed)
MD Samtani at bedside 

## 2014-03-09 NOTE — ED Notes (Signed)
Bed: SU11 Expected date: 03/09/14 Expected time: 2:14 PM Means of arrival:  Comments: Near syncope

## 2014-03-09 NOTE — ED Notes (Signed)
Report given to [facility] RN at  [facility]

## 2014-03-09 NOTE — ED Notes (Signed)
MD Yao at bedside 

## 2014-03-09 NOTE — ED Notes (Addendum)
Episode of Atrial Flutter on monitor. Run was printed our and given to Darl Householder MD. Repeat EKG given to Darl Householder as well.

## 2014-03-09 NOTE — Progress Notes (Signed)
Asked to comment on patient's EKG and telemetry which were concerning for arrhythmia:  Reviewed both his 12 lead and the strips in the system. This is NOT afib or flutter: he appears to have a regular tremor that is giving the appears of flutter waves in some leads but in others (lead V1), a P wave is clearly seen. He is also having PACs which gives the appears of irregular rhythm. Do NOT see any 2nd deg AV block on the multiple 12 leads done.  Reviewed telemetry here at [facility]: all sinus, occasional PAC.   Signed, Elias Else, Mila Homer MD 03/09/2014, 5:22 PM

## 2014-03-09 NOTE — ED Provider Notes (Signed)
CSN: [id number]     Arrival date & time 03/09/14  1427 History   First MD Initiated Contact with Patient 03/09/14 1454     Chief Complaint  Patient presents with  . Near Syncope  . Hypotension     (Consider location/radiation/quality/duration/timing/severity/associated sxs/prior Treatment) The history is provided by the patient.  Fernando Carr is a 73 y.o. male hx of HL, bipolar, AAA here with dizziness, near syncope. He has been having near syncopal episodes for several weeks. Often times he would did have all walk and felt lightheaded dizzy and almost passed out. Today he was at a store and stood up to pay and felt lightheaded dizzy and had to sit down. No actual syncope and no head injury. Denies any shortness of breath or chest pain. States that he has been eating very little recently but denies any abdominal pain or vomiting. Has a history of aortic aneurysm but hasn't been repaired. EMS noted BP 90/56, pulse 60. Given 500 cc NS.    Past Medical History  Diagnosis Date  . High cholesterol   . Bipolar disorder   . AAA (abdominal aortic aneurysm)   . Depression   . Renal insufficiency     chronic renal   . Suicide attempt aug. 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 Knee arthroscopy Left 1972   Family History  Problem Relation Age of Onset  . Alcohol abuse Mother   . Alcohol abuse Father   . Suicidality Paternal Uncle   . Suicidality Cousin   . Depression Daughter    History  Substance Use Topics  . Smoking status: Current Every Day Smoker -- 1.00 packs/day for 40 years    Types: Cigarettes  . Smokeless tobacco: Never Used  . Alcohol Use: No    Review of Systems  Cardiovascular: Positive for near-syncope.  Gastrointestinal: Positive for nausea.  Neurological: Positive for dizziness and light-headedness.  All other systems reviewed and are negative.     Allergies  Review of patient's  allergies indicates no known allergies.  Home Medications   Prior to Admission medications   Medication Sig Start Date End Date Taking? Authorizing Provider  escitalopram (LEXAPRO) 10 MG tablet Take 10 mg by mouth daily.   Yes Historical Provider, MD  fenofibrate micronized (LOFIBRA) 67 MG capsule Take 67 mg by mouth daily.  10/04/11  Yes Historical Provider, MD  risperiDONE (RISPERDAL) 1 MG tablet Take 1 tablet (1 mg total) by mouth at bedtime. 01/09/14 01/09/15 Yes Kathlee Nations, MD  simvastatin (ZOCOR) 20 MG tablet Take 10 mg by mouth daily.  05/23/11  Yes Historical Provider, MD   BP 128/94  Pulse 76  Temp(Src) 98.2 F (36.8 C) (Oral)  Resp 19  Ht 6' (1.829 m)  Wt 225 lb (102.059 kg)  BMI 30.51 kg/m2  SpO2 100% Physical Exam  Nursing note and vitals reviewed. Constitutional: He is oriented to person, place, and time.  Ill appearing   HENT:  Head: Normocephalic.  MM slightly dry   Eyes: Conjunctivae are normal. Pupils are equal, round, and reactive to light.  Neck: Normal range of motion. Neck supple.  Cardiovascular: Normal rate, regular rhythm and normal heart sounds.   Pulmonary/Chest: Effort normal.  Mild bibasilar crackles.   Abdominal:  Obese, nontender. ? Pulsatile mass   Musculoskeletal: Normal range of motion. He exhibits no edema and no tenderness.  Neurological: He is alert and  oriented to person, place, and time. No cranial nerve deficit. Coordination normal.  Skin: Skin is warm and dry.  Psychiatric: He has a normal mood and affect. His behavior is normal. Judgment and thought content normal.    ED Course  Procedures (including critical care time)  CRITICAL CARE Performed by: Darl Householder, [place] Mcphee   Total critical care time: 30 min   Critical care time was exclusive of separately billable procedures and treating other patients.  Critical care was necessary to treat or prevent imminent or life-threatening deterioration.  Critical care was time spent personally by  me on the following activities: development of treatment plan with patient and/or surrogate as well as nursing, discussions with consultants, evaluation of patient's response to treatment, examination of patient, obtaining history from patient or surrogate, ordering and performing treatments and interventions, ordering and review of laboratory studies, ordering and review of radiographic studies, pulse oximetry and re-evaluation of patient's condition.   Labs Review Labs Reviewed  CBC WITH DIFFERENTIAL - Abnormal; Notable for the following:    Neutrophils Relative % 79 (*)    All other components within normal limits  COMPREHENSIVE METABOLIC PANEL - Abnormal; Notable for the following:    Glucose, Bld 111 (*)    BUN 24 (*)    Creatinine, Ser 1.75 (*)    Calcium 10.9 (*)    GFR calc non Af Amer 37 (*)    GFR calc Af Amer 43 (*)    All other components within normal limits  URINALYSIS, ROUTINE W REFLEX MICROSCOPIC - Abnormal; Notable for the following:    Color, Urine AMBER (*)    APPearance CLOUDY (*)    Protein, ur 100 (*)    Urobilinogen, UA 2.0 (*)    All other components within normal limits  URINE MICROSCOPIC-ADD ON  I-STAT TROPOININ, ED  I-STAT CG4 LACTIC ACID, ED    Imaging Review Ct Angio Chest W/cm &/or Wo Cm  03/09/2014   CLINICAL DATA:  Near syncope. Hypertension. Known abdominal aortic aneurysm. Patient reports no pain in the chest or abdomen.  EXAM: CT ANGIOGRAPHY CHEST  CT ABDOMEN AND PELVIS WITH CONTRAST  TECHNIQUE: Multidetector CT imaging of the chest was performed using the standard protocol during bolus administration of intravenous contrast. Multiplanar CT image reconstructions and MIPs were obtained to evaluate the vascular anatomy. Multidetector CT imaging of the abdomen and pelvis was performed using the standard protocol during bolus administration of intravenous contrast.  CONTRAST:  177mL OMNIPAQUE IOHEXOL 350 MG/ML SOLN  COMPARISON:  CT of the abdomen and pelvis  on 06/05/2005  FINDINGS: CTA CHEST FINDINGS  Heart: Heart is normal in size. Coronary vascular calcification is present. No pericardial effusion.  Vascular structures: The pulmonary arteries are well opacified. There is no evidence for acute pulmonary embolus. There is atherosclerotic calcification and mural thrombus involving the thoracic aorta. No aneurysm or dissection.  Mediastinum/thyroid: The visualized portion of the thyroid gland has a normal appearance. No mediastinal, hilar, or axillary adenopathy.  Lungs: No pulmonary nodules, pleural effusions, or infiltrates.  Chest wall/osseous structures: No suspicious lytic or blastic lesions.  CT ABDOMEN and PELVIS FINDINGS  Upper abdomen: No focal abnormality identified within the liver, spleen, pancreas, or right adrenal gland. Small left adrenal nodule is 1.6 cm and stable in appearance, consistent with benign process. The gallbladder is present. Right renal cysts are present. No hydronephrosis.  Bowel: The stomach and small bowel loops are normal in appearance. No acute abnormality of the colonic loops. The appendix is  well seen and has a normal appearance. No significant adenopathy.  Urinary bladder, prostate, and seminal vesicles have a normal appearance.  Vascular: There is an infrarenal abdominal aortic aneurysm. This measures maximum 5.4 x 4.8 cm. There is significant mural thrombus. 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 There is short segment dissection of the right iliac artery. There is significant atherosclerotic calcification of both iliac branches.  Abdominal wall: Unremarkable.  Osseous structures: Significant degenerative changes without suspicious lytic or blastic lesions.  Review of the MIP images confirms the above findings.  IMPRESSION: 1. Technically adequate exam showing no pulmonary embolus. 2. Atherosclerotic calcification of the thoracic aorta. 3. Abdominal aortic aneurysm measuring 5.4 x 4.8 cm with significant  progression since 2006 study. 4. 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 5. A benign left adrenal nodule 1.6 cm. 6. Normal appendix.   Electronically Signed   By: Shon Hale M.D.   On: 03/09/2014 16:29   Ct Abdomen Pelvis W Contrast  03/09/2014   CLINICAL DATA:  Near syncope. Hypertension. Known abdominal aortic aneurysm. Patient reports no pain in the chest or abdomen.  EXAM: CT ANGIOGRAPHY CHEST  CT ABDOMEN AND PELVIS WITH CONTRAST  TECHNIQUE: Multidetector CT imaging of the chest was performed using the standard protocol during bolus administration of intravenous contrast. Multiplanar CT image reconstructions and MIPs were obtained to evaluate the vascular anatomy. Multidetector CT imaging of the abdomen and pelvis was performed using the standard protocol during bolus administration of intravenous contrast.  CONTRAST:  127mL OMNIPAQUE IOHEXOL 350 MG/ML SOLN  COMPARISON:  CT of the abdomen and pelvis on 06/05/2005  FINDINGS: CTA CHEST FINDINGS  Heart: Heart is normal in size. Coronary vascular calcification is present. No pericardial effusion.  Vascular structures: The pulmonary arteries are well opacified. There is no evidence for acute pulmonary embolus. There is atherosclerotic calcification and mural thrombus involving the thoracic aorta. No aneurysm or dissection.  Mediastinum/thyroid: The visualized portion of the thyroid gland has a normal appearance. No mediastinal, hilar, or axillary adenopathy.  Lungs: No pulmonary nodules, pleural effusions, or infiltrates.  Chest wall/osseous structures: No suspicious lytic or blastic lesions.  CT ABDOMEN and PELVIS FINDINGS  Upper abdomen: No focal abnormality identified within the liver, spleen, pancreas, or right adrenal gland. Small left adrenal nodule is 1.6 cm and stable in appearance, consistent with benign process. The gallbladder is present. Right renal cysts are present. No hydronephrosis.  Bowel: The stomach and small bowel loops are  normal in appearance. No acute abnormality of the colonic loops. The appendix is well seen and has a normal appearance. No significant adenopathy.  Urinary bladder, prostate, and seminal vesicles have a normal appearance.  Vascular: There is an infrarenal abdominal aortic aneurysm. This measures maximum 5.4 x 4.8 cm. There is significant mural thrombus. 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 There is short segment dissection of the right iliac artery. There is significant atherosclerotic calcification of both iliac branches.  Abdominal wall: Unremarkable.  Osseous structures: Significant degenerative changes without suspicious lytic or blastic lesions.  Review of the MIP images confirms the above findings.  IMPRESSION: 1. Technically adequate exam showing no pulmonary embolus. 2. Atherosclerotic calcification of the thoracic aorta. 3. Abdominal aortic aneurysm measuring 5.4 x 4.8 cm with significant progression since 2006 study. 4. 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 5. A benign left adrenal nodule 1.6 cm. 6. Normal appendix.   Electronically Signed   By: Damien Fusi.D.  On: 03/09/2014 16:29     EKG Interpretation   Date/Time:  Sunday March 09 2014 14:51:01 EDT Ventricular Rate:  62 PR Interval:  171 QRS Duration: 95 QT Interval:  387 QTC Calculation: 393 R Axis:   -4 Text Interpretation:  Sinus rhythm Atrial premature complexes Anteroseptal  infarct, old No significant change since last tracing Confirmed by Darwin Guastella   MD, Syerra Abdelrahman ([id number]) on 03/09/2014 3:05:14 PM      MDM   Final diagnoses:  None   Fernando Carr is a 73 y.o. male here with nausea, hypotension, near syncope. I am concerned for possible PE vs AAA rupture. Will get CT angio chest and ab/pel. Will get labs to r/o sepsis. Will hydrate and reassess.   4: 30 PM CT showed increased AAA with R iliac artery aneurysm with short segment dissection. Patient also in new onset afib. I called Dr. Kellie Simmering,  who reviewed images. Given that he has pulse in R leg (diminished but confirmed by doppler), recommend medical admission for new onset afib and he will see patient at [facility].     Wandra Arthurs, MD 03/09/14 [phone]

## 2014-03-09 NOTE — ED Notes (Signed)
Pt reports he has had episodes like this for past few months. He believes it is related to his poor eating habits. Made himself eat well today, had eggs, bacon, pancakes. Lately though has had poor oral intake, losing 50lbs over past year. Poor appetite.

## 2014-03-09 NOTE — ED Notes (Signed)
[facility] at bedside 

## 2014-03-09 NOTE — ED Notes (Signed)
Report given to [place] at [facility]

## 2014-03-09 NOTE — Consult Note (Signed)
Vascular Surgery Consultation  Reason for Consult: Abdominal aortic aneurysm  HPI: Fernando Carr is a 73 y.o. male who presents for evaluation of abdominal aortic aneurysm. Patient had near syncopal episode today and went to [facility] long emergency room. Patient has had a few of these episodes over the past few months lasting about 30 seconds. He has never had true syncope. He denies any lateralizing weakness, any aphasia, amaurosis fugax, diplopia, blurred vision, or true syncope. He has been followed in the office by Dr. early for an abdominal aortic aneurysm last seen May 2013 and the aneurysm was 4.1 cm in maximum diameter. He was noted to have some atrial fib-flutter in the [place] long emergency room with a control rate   Past Medical History  Diagnosis Date  . High cholesterol   . Bipolar disorder   . AAA (abdominal aortic aneurysm)   . Depression   . Renal insufficiency     chronic renal   . Suicide attempt aug. 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 Knee arthroscopy Left 1972   History   Social History  . Marital Status: Single    Spouse Name: N/A    Number of Children: N/A  . Years of Education: N/A   Social History Main Topics  . Smoking status: Current Every Day Smoker -- 1.00 packs/day for 40 years    Types: Cigarettes  . Smokeless tobacco: Never Used  . Alcohol Use: No  . Drug Use: No  . Sexual Activity: No   Other Topics Concern  . None   Social History Narrative  . None   Family History  Problem Relation Age of Onset  . Alcohol abuse Mother   . Alcohol abuse Father   . Suicidality Paternal Uncle   . Suicidality Cousin   . Depression Daughter    No Known Allergies Prior to Admission medications   Medication Sig Start Date End Date Taking? Authorizing Provider  escitalopram (LEXAPRO) 10 MG tablet Take 10 mg by mouth daily.   Yes Historical Provider, MD  fenofibrate micronized  (LOFIBRA) 67 MG capsule Take 67 mg by mouth daily.  10/04/11  Yes Historical Provider, MD  risperiDONE (RISPERDAL) 1 MG tablet Take 1 tablet (1 mg total) by mouth at bedtime. 01/09/14 01/09/15 Yes Kathlee Nations, MD  simvastatin (ZOCOR) 20 MG tablet Take 10 mg by mouth daily.  05/23/11  Yes Historical Provider, MD     Positive ROS: Denies chest pain, dyspnea on exertion, PND, orthopnea, claudication, TIAs, previous CVA, diabetes mellitus.  All other systems have been reviewed and were otherwise negative with the exception of those mentioned in the HPI and as above.  Physical Exam: Filed Vitals:   03/09/14 2000  BP: 152/72  Pulse: 61  Temp:   Resp: 16    General: Alert, no acute distress HEENT: Normal for age Cardiovascular: Regular rate and rhythm. Carotid pulses 2+, no bruits audible Respiratory: Clear to auscultation. No cyanosis, no use of accessory musculature GI: No organomegaly, abdomen is soft and non-tender bed 5 cm pulsatile mass which is nontender Skin: No lesions in the area of chief complaint Neurologic: Sensation intact distally Psychiatric: Patient is competent for consent with normal mood and affect Musculoskeletal: No obvious deformities Extremities: 3+ femoral popliteal and posterior tibial pulses palpable bilaterally.  Imaging reviewed: CT angiogram of abdomen and pelvis was reviewed by me. Patient has a 5.4 cm infrarenal  renal abdominal aortic aneurysm which appears to extend up to the renal arteries. Also has a small right common iliac aneurysm-2.2 cm with some localized dissection.   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 Patient admitted with a presyncopal episode-not related to aortic aneurysm Patient noted to have some atrial fib-flutter and [facility] long emergency department which could be cause of the syncopal type spells  I will notify Dr. early to see patient regarding his aortic  aneurysm. He will likely require open repair and this will be further discussed by  Dr. early   Tinnie Gens, MD 03/09/2014 8:32 PM

## 2014-03-09 NOTE — Progress Notes (Signed)
UR Completed.  Fernando Carr [id number] 03/09/2014

## 2014-03-09 NOTE — H&P (Signed)
[facility] History and Physical  Eswin Worrell Newhart WJX:[id number] DOB: 02-23-1941 DOA: 03/09/2014  Referring physician: ED PCP: [name], MD  Specialists: Vascular  Chief Complaint: Weak, dizzy  HPI:   73 y/o ?, known h/o AAA diagnosed 2012 ~ 4x4 cm, SI and MDD follwed by Dr. Adele Schilder, HLD, chronic Tob abuse came to [facility] from [place] 12:00 pm after feeling faint and dizzy.  The Gas station attendant was concerned as he fetl woozy.  Patient states has had these issues for the past couple of months but this is the worst it has been.  He has steadily been losing weight without trying.  He denies any constitutional fever, chill, ill contacts, n,v,cp, sob, abd pain, dsyuria, diarrhea, blurred vision, double vision .  In addition denies unilateral weakness numbness.  He has no h/o Sz or TIA In the Ed was initally hypotensive 90/60 and given IVF bolus When orthostatics were done patient felt dixxy initally.  He was given IVF bolus 1l and felt a little better. Orthostatics repeated in Ed were wnl  in cours eof his work-up it was noted he had Afib/flutter altrnaiting with NSR [rate controlled] CT abd andchest done-extension of AAA with dissection as below VVS Kellie Simmering consutled who will see patient at [facility] SDU Mild renal insuff on labs  20/1.21-->24/1.75 POC trop neg Lactic acid 2.17  Review of Systems: Tsee above  Past Medical History  Diagnosis Date  . High cholesterol   . Bipolar disorder   . AAA (abdominal aortic aneurysm)   . Depression   . Renal insufficiency     chronic renal   . Suicide attempt aug. 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 Knee arthroscopy Left 1972   Social History:  History   Social History Narrative  . No narrative on file    No Known Allergies  Family History  Problem Relation Age of Onset  . Alcohol abuse Mother   . Alcohol abuse Father   . Suicidality Paternal  Uncle   . Suicidality Cousin   . Depression Daughter      Prior to Admission medications   Medication Sig Start Date End Date Taking? Authorizing Provider  escitalopram (LEXAPRO) 10 MG tablet Take 10 mg by mouth daily.   Yes Historical Provider, MD  fenofibrate micronized (LOFIBRA) 67 MG capsule Take 67 mg by mouth daily.  10/04/11  Yes Historical Provider, MD  risperiDONE (RISPERDAL) 1 MG tablet Take 1 tablet (1 mg total) by mouth at bedtime. 01/09/14 01/09/15 Yes Kathlee Nations, MD  simvastatin (ZOCOR) 20 MG tablet Take 10 mg by mouth daily.  05/23/11  Yes Historical Provider, MD   Physical Exam: Filed Vitals:   03/09/14 1510 03/09/14 1545 03/09/14 1555 03/09/14 1634  BP: 105/55   161/64  Pulse: 65 80 72 84  Temp:      TempSrc:      Resp: 22 15 15 18   Height:    6' (1.829 m)  Weight:    102.059 kg (225 lb)  SpO2: 98% 100% 100% 100%     General:  Alert eomi, ncat anxious  Eyes: perrla  ENT:  Mod dentition throat clear  Neck: Jvd not noted.  No bruit  Cardiovascular:  s1 s2 irreg alt with some interspersed regular rates  Respiratory: clear  Abdomen: sft, norebound, no guard, BS heard  Skin: intact, no le edeam  Musculoskeletal: rom intact  Psychiatric:  anxious  Neurologic: gorslly moving all 4 extremities  Labs on Admission:  Basic Metabolic Panel:  Recent Labs Lab 03/09/14 1438  NA 141  K 4.1  CL 104  CO2 23  GLUCOSE 111*  BUN 24*  CREATININE 1.75*  CALCIUM 10.9*   Liver Function Tests:  Recent Labs Lab 03/09/14 1438  AST 10  ALT 13  ALKPHOS 87  BILITOT 0.3  PROT 6.7  ALBUMIN 3.6   No results found for this basename: LIPASE, AMYLASE,  in the last 168 hours No results found for this basename: AMMONIA,  in the last 168 hours CBC:  Recent Labs Lab 03/09/14 1438  WBC 7.5  NEUTROABS 5.8  HGB 13.3  HCT 40.1  MCV 89.3  PLT 150   Cardiac Enzymes: No results found for this basename: CKTOTAL, CKMB, CKMBINDEX, TROPONINI,  in the last 168  hours  BNP (last 3 results) No results found for this basename: PROBNP,  in the last 8760 hours CBG: No results found for this basename: GLUCAP,  in the last 168 hours  Radiological Exams on Admission: Ct Angio Chest W/cm &/or Wo Cm  03/09/2014   CLINICAL DATA:  Near syncope. Hypertension. Known abdominal aortic aneurysm. Patient reports no pain in the chest or abdomen.  EXAM: CT ANGIOGRAPHY CHEST  CT ABDOMEN AND PELVIS WITH CONTRAST  TECHNIQUE: Multidetector CT imaging of the chest was performed using the standard protocol during bolus administration of intravenous contrast. Multiplanar CT image reconstructions and MIPs were obtained to evaluate the vascular anatomy. Multidetector CT imaging of the abdomen and pelvis was performed using the standard protocol during bolus administration of intravenous contrast.  CONTRAST:  127mL OMNIPAQUE IOHEXOL 350 MG/ML SOLN  COMPARISON:  CT of the abdomen and pelvis on 06/05/2005  FINDINGS: CTA CHEST FINDINGS  Heart: Heart is normal in size. Coronary vascular calcification is present. No pericardial effusion.  Vascular structures: The pulmonary arteries are well opacified. There is no evidence for acute pulmonary embolus. There is atherosclerotic calcification and mural thrombus involving the thoracic aorta. No aneurysm or dissection.  Mediastinum/thyroid: The visualized portion of the thyroid gland has a normal appearance. No mediastinal, hilar, or axillary adenopathy.  Lungs: No pulmonary nodules, pleural effusions, or infiltrates.  Chest wall/osseous structures: No suspicious lytic or blastic lesions.  CT ABDOMEN and PELVIS FINDINGS  Upper abdomen: No focal abnormality identified within the liver, spleen, pancreas, or right adrenal gland. Small left adrenal nodule is 1.6 cm and stable in appearance, consistent with benign process. The gallbladder is present. Right renal cysts are present. No hydronephrosis.  Bowel: The stomach and small bowel loops are normal in  appearance. No acute abnormality of the colonic loops. The appendix is well seen and has a normal appearance. No significant adenopathy.  Urinary bladder, prostate, and seminal vesicles have a normal appearance.  Vascular: There is an infrarenal abdominal aortic aneurysm. This measures maximum 5.4 x 4.8 cm. There is significant mural thrombus. 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 There is short segment dissection of the right iliac artery. There is significant atherosclerotic calcification of both iliac branches.  Abdominal wall: Unremarkable.  Osseous structures: Significant degenerative changes without suspicious lytic or blastic lesions.  Review of the MIP images confirms the above findings.  IMPRESSION: 1. Technically adequate exam showing no pulmonary embolus. 2. Atherosclerotic calcification of the thoracic aorta. 3. Abdominal aortic aneurysm measuring 5.4 x 4.8 cm with significant progression since 2006 study. 4. 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 5.  A benign left adrenal nodule 1.6 cm. 6. Normal appendix.   Electronically Signed   By: Shon Hale M.D.   On: 03/09/2014 16:29   Ct Abdomen Pelvis W Contrast  03/09/2014   CLINICAL DATA:  Near syncope. Hypertension. Known abdominal aortic aneurysm. Patient reports no pain in the chest or abdomen.  EXAM: CT ANGIOGRAPHY CHEST  CT ABDOMEN AND PELVIS WITH CONTRAST  TECHNIQUE: Multidetector CT imaging of the chest was performed using the standard protocol during bolus administration of intravenous contrast. Multiplanar CT image reconstructions and MIPs were obtained to evaluate the vascular anatomy. Multidetector CT imaging of the abdomen and pelvis was performed using the standard protocol during bolus administration of intravenous contrast.  CONTRAST:  122mL OMNIPAQUE IOHEXOL 350 MG/ML SOLN  COMPARISON:  CT of the abdomen and pelvis on 06/05/2005  FINDINGS: CTA CHEST FINDINGS  Heart: Heart is normal in size. Coronary  vascular calcification is present. No pericardial effusion.  Vascular structures: The pulmonary arteries are well opacified. There is no evidence for acute pulmonary embolus. There is atherosclerotic calcification and mural thrombus involving the thoracic aorta. No aneurysm or dissection.  Mediastinum/thyroid: The visualized portion of the thyroid gland has a normal appearance. No mediastinal, hilar, or axillary adenopathy.  Lungs: No pulmonary nodules, pleural effusions, or infiltrates.  Chest wall/osseous structures: No suspicious lytic or blastic lesions.  CT ABDOMEN and PELVIS FINDINGS  Upper abdomen: No focal abnormality identified within the liver, spleen, pancreas, or right adrenal gland. Small left adrenal nodule is 1.6 cm and stable in appearance, consistent with benign process. The gallbladder is present. Right renal cysts are present. No hydronephrosis.  Bowel: The stomach and small bowel loops are normal in appearance. No acute abnormality of the colonic loops. The appendix is well seen and has a normal appearance. No significant adenopathy.  Urinary bladder, prostate, and seminal vesicles have a normal appearance.  Vascular: There is an infrarenal abdominal aortic aneurysm. This measures maximum 5.4 x 4.8 cm. There is significant mural thrombus. 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 There is short segment dissection of the right iliac artery. There is significant atherosclerotic calcification of both iliac branches.  Abdominal wall: Unremarkable.  Osseous structures: Significant degenerative changes without suspicious lytic or blastic lesions.  Review of the MIP images confirms the above findings.  IMPRESSION: 1. Technically adequate exam showing no pulmonary embolus. 2. Atherosclerotic calcification of the thoracic aorta. 3. Abdominal aortic aneurysm measuring 5.4 x 4.8 cm with significant progression since 2006 study. 4. 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 5. A  benign left adrenal nodule 1.6 cm. 6. Normal appendix.   Electronically Signed   By: Shon Hale M.D.   On: 03/09/2014 16:29    EKG: Independently reviewed. EKG initl=NSR + PAC's I am unable to see any real AFib.  I see dropped beats and this may represent 2nd degree bloakc more so than Afib  Assessment/Plan Principal Problem:   Hypotension, postural-potentially 2/2 to sub-acute weigh loss.  He hasn't really been eating and drinking.  Will hydrate 75 cc/hour and monitor.  Rpt orthostatics q shift.  Balance medication use in him judiciously as has supine hypertension   Afib?-Unclear if he actually has this.  Review of tele shows clear areas where he has P waves.  I think he may have 2nd degree AVB with interspersed regions of either dropped beats or PAC's.  I spoke to Dr. Liam Graham and confirmed this didn't look like like Afib.    Abdominal  aneurysm without mention of rupture-AAA shows progression since 2012 when first seen from 4x4-->5.4x 4.8 C R iliac artery short segement dissection-I have kept him NPO after midnight in case he needs surgery in am by VVS-they will see him at [facility] SDU   Bipolar 1-continue meds Orally   Hyperlipemia-Continue statin  Morbid obesity, Body mass index is 30.51 kg/(m^2).   Chronic renal disease-careful consideration of Anti -htn agent-he has supine Htn and orthostatic hypotension-potentitally would use hydralazine   Code Status: Full  Family Communication: none bedside  Disposition Plan: inpatient SDU at [facility]  Time spent: [place], [facility] Pager [phone]  If 7PM-7AM, please contact night-coverage [email or web address] Password [email or web address] 03/09/2014, 4:47 PM

## 2014-03-09 NOTE — ED Notes (Signed)
Pt in from a gas station, was standing in line, pt became dizzy, "glassy-eyed", was helped to floor by attendant, never passed out. EMS attempted orthostatic vitals, pt unable to tolerating standing pressure. Sitting BP 90/56, p 60. Pt asymptomatic now. EMS 18g L AC, 516ml NS en route. CBG 124.

## 2014-03-10 DIAGNOSIS — I10 Essential (primary) hypertension: Secondary | ICD-10-CM | POA: Diagnosis present

## 2014-03-10 DIAGNOSIS — I519 Heart disease, unspecified: Secondary | ICD-10-CM

## 2014-03-10 DIAGNOSIS — R627 Adult failure to thrive: Secondary | ICD-10-CM | POA: Diagnosis present

## 2014-03-10 DIAGNOSIS — E860 Dehydration: Secondary | ICD-10-CM | POA: Diagnosis present

## 2014-03-10 DIAGNOSIS — I723 Aneurysm of iliac artery: Secondary | ICD-10-CM | POA: Diagnosis present

## 2014-03-10 DIAGNOSIS — R001 Bradycardia, unspecified: Secondary | ICD-10-CM | POA: Diagnosis present

## 2014-03-10 LAB — COMPREHENSIVE METABOLIC PANEL
ALT: 11 U/L (ref 0–53)
AST: 10 U/L (ref 0–37)
Albumin: 3.3 g/dL — ABNORMAL LOW (ref 3.5–5.2)
Alkaline Phosphatase: 82 U/L (ref 39–117)
Anion gap: 12 (ref 5–15)
BUN: 21 mg/dL (ref 6–23)
CO2: 24 mEq/L (ref 19–32)
Calcium: 10.3 mg/dL (ref 8.4–10.5)
Chloride: 109 mEq/L (ref 96–112)
Creatinine, Ser: 1.34 mg/dL (ref 0.50–1.35)
GFR calc Af Amer: 59 mL/min — ABNORMAL LOW (ref >90)
GFR calc non Af Amer: 51 mL/min — ABNORMAL LOW (ref >90)
Glucose, Bld: 82 mg/dL (ref 70–99)
Potassium: 3.7 mEq/L (ref 3.7–5.3)
Sodium: 145 mEq/L (ref 137–147)
Total Bilirubin: 0.2 mg/dL — ABNORMAL LOW (ref 0.3–1.2)
Total Protein: 6 g/dL (ref 6.0–8.3)

## 2014-03-10 LAB — PROTIME-INR
INR: 1.05 (ref 0.00–1.49)
Prothrombin Time: 13.7 seconds (ref 11.6–15.2)

## 2014-03-10 LAB — CBC
HCT: 37.3 % — ABNORMAL LOW (ref 39.0–52.0)
Hemoglobin: 12.2 g/dL — ABNORMAL LOW (ref 13.0–17.0)
MCH: 29.3 pg (ref 26.0–34.0)
MCHC: 32.7 g/dL (ref 30.0–36.0)
MCV: 89.4 fL (ref 78.0–100.0)
Platelets: 142 10*3/uL — ABNORMAL LOW (ref 150–400)
RBC: 4.17 MIL/uL — ABNORMAL LOW (ref 4.22–5.81)
RDW: 13.3 % (ref 11.5–15.5)
WBC: 6.3 10*3/uL (ref 4.0–10.5)

## 2014-03-10 LAB — TSH: TSH: 0.301 u[IU]/mL — ABNORMAL LOW (ref 0.350–4.500)

## 2014-03-10 MED ORDER — SODIUM CHLORIDE 0.9 % IV SOLN
INTRAVENOUS | Status: DC
  Administered 2014-03-10 (×2): via INTRAVENOUS

## 2014-03-10 MED ORDER — SODIUM CHLORIDE 0.9 % IV SOLN: INTRAVENOUS | Status: DC

## 2014-03-10 NOTE — Progress Notes (Signed)
[facility]  Fernando Carr PPI:[id number] DOB: 07/18/1941 DOA: 03/09/2014 PCP: [name], MD  Brief narrative: 73 year old male patient with known history of abdominal aortic aneurysm as well as bipolar disorder. Presented to the hospital after experiencing weakness with faintness and dizziness. Apparently had been having these symptoms for several months. He also endorsed unintentional weight loss. No other symptoms.  Upon presentation to the ER his blood pressure was low at 90/60 and he was given IV fluids with improvement in his blood pressure. Due to his known history of AAA and presentation with hypotension and near syncope there were concerns that he may have dissected the aneurysm but CT of the abdomen and chest revealed no aortic dissection but significant enlargement in the aneurysm since last evaluated several years previous. In addition he had mild renal insufficiency. His lactic acid was also mildly elevated at 2.17. VVS was consulted with plans to evaluate the patient after admission to the hospital.  HPI/Subjective: Alert and denying any shortness of breath chest or back pain at present. Clarified preadmission history noting has not been eating well but relates this to the fact that he is just not cooking and he may or may not be a little depressed. 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 Donnetta Hutching to evaluate pt -Note distal pulses right lower extremity not palpable but but skin warm to touch    Hypotension, postural/Dehydration -Due to poor intake prior to admission-continue IV fluid -Patient lives alone and does not prepare his own meals and only eats about one time per  day -Once above problem clarified will need evaluation for possible placement in assisted living facility versus skilled nursing facility    AKI (acute kidney injury)/CRD (chronic renal disease), stage III -Etiology due to dehydration preadmission - follow renal function closely given AAA with significant mural thrombus   Abnormal TSH -TSH 0.301 -ck free T4      FTT (failure to thrive) in adult -As above -Likely will need placement upon discharge    Sinus bradycardia -Follow and avoid beta blockers or calcium channel blockers     Hyperlipemia  -On fenofibrate as well as simvastatin prior to admission    Bipolar 1 disorder -Continue home medications    HTN  -Blood pressure has been quite variable since admission but now that euvolemic has been on the high side -Likely will need to begin antihypertensive agent within the next 24 hours after adequately rehydrated -ECHO this admit c/w preserved LV fnx and grade 1 DD  DVT prophylaxis: SCDs Code Status: Full Family Communication: No family at bedside Disposition Plan/Expected LOS: stable for transfer to tele bed (having some bradycardia)  Consultants: VVS  Procedures: 2-D echocardiogram  - Left ventricle: The cavity size was normal. Wall thickness was normal. Systolic function was vigorous. The estimated ejection fraction was in the range of 65% to 70%. Wall motion was normal; there were no regional wall motion abnormalities. Doppler parameters are consistent with abnormal left ventricular relaxation (grade 1 diastolic dysfunction).  Cultures: None  Antibiotics: None  Objective: Blood pressure 128/71, pulse 54, temperature 98.3 F (36.8 C), temperature source Oral, resp. rate 16, height 6' (1.829 m), weight 215 lb 9.8 oz (97.8 kg), SpO2 94.00%.  Intake/Output Summary (Last 24 hours) at 03/10/14 1357 Last data filed at 03/10/14 0851  Gross per 24 hour  Intake 530.33 ml  Output   1150 ml  Net -619.67 ml     Exam: Gen: No acute respiratory distress-flat affect Chest: Clear to auscultation bilaterally without wheezes, rhonchi or crackles, room air Cardiac: Regular rate and rhythm, S1-S2, no rubs murmurs or gallops, no peripheral edema, upper extremity peripheral pulses easily palpable at 2+, lower extremity pulses palpable except for right DP and TP but foot w/o evidence of ischemia on exam Abdomen: Soft nontender nondistended without obvious hepatosplenomegaly, no ascites Extremities: Symmetrical in appearance without cyanosis, clubbing or effusion  Scheduled Meds:  Scheduled Meds: . escitalopram  10 mg Oral Daily  . fenofibrate  54 mg Oral Daily  . risperiDONE  1 mg Oral QHS  . simvastatin  10 mg Oral QHS  . sodium chloride  3 mL Intravenous Q12H   Data Reviewed: Basic Metabolic Panel:  Recent Labs Lab 03/09/14 1438 03/10/14 0255  NA 141 145  K 4.1 3.7  CL 104 109  CO2 23 24  GLUCOSE 111* 82  BUN 24* 21  CREATININE 1.75* 1.34  CALCIUM 10.9* 10.3   Liver Function Tests:  Recent Labs Lab 03/09/14 1438 03/10/14 0255  AST 10 10  ALT 13 11  ALKPHOS 87 82  BILITOT 0.3 0.2*  PROT 6.7 6.0  ALBUMIN 3.6 3.3*   CBC:  Recent Labs Lab 03/09/14 1438 03/10/14 0255  WBC 7.5 6.3  NEUTROABS 5.8  --   HGB 13.3 12.2*  HCT 40.1 37.3*  MCV 89.3 89.4  PLT 150 142*    Recent Results (from the past 240 hour(s))  MRSA PCR SCREENING     Status: None   Collection Time    03/09/14  6:36 PM      Result Value Ref Range Status   MRSA by PCR NEGATIVE  NEGATIVE Final   Comment:            The GeneXpert MRSA Assay (FDA     approved for NASAL specimens     only), is one component of a     comprehensive MRSA colonization     surveillance program. It is not     intended to diagnose MRSA     infection nor to guide or     monitor treatment for     MRSA infections.     Studies:  Recent x-ray studies have been reviewed in detail by the Attending Physician  Time spent :  [place], ANP [facility] Office  [phone] Pager [phone]   **If unable to reach the above provider after paging please contact the [facility] @ [phone]  On-Call/Text Page:      [email or web address]      password [email or web address]  If 7PM-7AM, please contact night-coverage [email or web address] Password [email or web address] 03/10/2014, 1:57 PM   LOS: 1 day   I have personally examined this patient and reviewed the entire database. I have reviewed the above note, made any necessary editorial changes, and agree with its content.  Cherene Altes, MD [facility]

## 2014-03-10 NOTE — Progress Notes (Signed)
Pt transferred to 2w06 via wheelchair. Pt is alert and oriented x 4,able to follow commands and denies any pain at this time. Standard hospital orientation completed. Bed in lowest position, call bell within reach. Report received from [facility].

## 2014-03-10 NOTE — Progress Notes (Signed)
Echocardiogram 2D Echocardiogram has been performed.  Darlina Sicilian M 03/10/2014, 2:28 PM

## 2014-03-10 NOTE — Progress Notes (Signed)
Patient ID: Fernando Carr, male   DOB: 22-Mar-1941, 73 y.o.   MRN: [id number]     Patient name: Fernando Carr MRN: [id number] DOB: 06/27/1941 Sex: male     Reason for referral:  Chief Complaint  Patient presents with  . Near Syncope  . Hypotension  AAA  HISTORY OF PRESENT ILLNESS: Patient is known to me from a prior followup of small infrarenal abdominal aortic aneurysm with serial ultrasound in our office. Was seen by Dr. Kellie Simmering last night when he presented with a syncopal episode. Evaluation continues but no clear cut cause for this yet. Did undergo 2-D echocardiogram this afternoon. Denies any abdominal pain.  Past Medical History  Diagnosis Date  . High cholesterol   . Bipolar disorder   . AAA (abdominal aortic aneurysm)   . Depression   . Renal insufficiency     chronic renal   . Suicide attempt aug. 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 Knee arthroscopy Left 1972    History   Social History  . Marital Status: Single    Spouse Name: N/A    Number of Children: N/A  . Years of Education: N/A   Occupational History  . Not on file.   Social History Main Topics  . Smoking status: Current Every Day Smoker -- 1.00 packs/day for 40 years    Types: Cigarettes  . Smokeless tobacco: Never Used  . Alcohol Use: No  . Drug Use: No  . Sexual Activity: No   Other Topics Concern  . Not on file   Social History Narrative  . No narrative on file    Family History  Problem Relation Age of Onset  . Alcohol abuse Mother   . Alcohol abuse Father   . Suicidality Paternal Uncle   . Suicidality Cousin   . Depression Daughter     Allergies as of 03/09/2014  . (No Known Allergies)    No current facility-administered medications on file prior to encounter.   Current Outpatient Prescriptions on File Prior to Encounter  Medication Sig Dispense Refill  . fenofibrate micronized (LOFIBRA) 67 MG  capsule Take 67 mg by mouth daily.       . risperiDONE (RISPERDAL) 1 MG tablet Take 1 tablet (1 mg total) by mouth at bedtime.  30 tablet  1  . simvastatin (ZOCOR) 20 MG tablet Take 10 mg by mouth daily.           PHYSICAL EXAMINATION:  General: The patient is a well-nourished male, in no acute distress. Vital signs are BP 128/71  Pulse 54  Temp(Src) 98.3 F (36.8 C) (Oral)  Resp 16  Ht 6' (1.829 m)  Wt 215 lb 9.8 oz (97.8 kg)  BMI 29.24 kg/m2  SpO2 94% Pulmonary: There is a good air exchange Abdomen: Soft and non-tender . Easily palpable aneurysm which is quite pulsatile. He has no tenderness over this Musculoskeletal: There are no major deformities.  There is no significant extremity pain. Neurologic: No focal weakness or paresthesias are detected, Skin: There are no ulcer or rashes noted. Psychiatric: The patient has normal affect. Cardiovascular: 2+ femoral pulses.    Impression and Plan:  Discuss this at length with the patient. His most recent ultrasound in our office was October 2014. At that time his aneurysm was felt to be 4.3 cm. A CT scan yesterday it is 5.4 cm. The aneurysm begins at the  level of the renal arteries and there is ectasia continuing into the iliac arteries bilaterally. He is not a stent graft candidate. I discussed the significance of his aneurysm size. I explained this is concerning due to the 5.4 centimeter size and the fact that it looks as though it grew greater than 1 cm in the last 9 months. I have recommended elective repair. This can be done following evaluation of his current syncopal episode. Do not feel there is any relationship. I explained we would need cardiac clearance. This could be done in the hospital or as an outpatient if he is discharged quickly. No history of cardiac disease. Okay to move out of the ICU from a vascular standpoint and does not need to be n.p.o. from a vascular standpoint    Meara Wiechman [facility]  [place] Office: [phone]

## 2014-03-11 ENCOUNTER — Other Ambulatory Visit: Payer: Self-pay | Admitting: *Deleted

## 2014-03-11 ENCOUNTER — Ambulatory Visit (HOSPITAL_COMMUNITY): Payer: Self-pay | Admitting: Psychiatry

## 2014-03-11 ENCOUNTER — Encounter (HOSPITAL_COMMUNITY): Payer: Self-pay | Admitting: General Practice

## 2014-03-11 DIAGNOSIS — N179 Acute kidney failure, unspecified: Secondary | ICD-10-CM

## 2014-03-11 DIAGNOSIS — Z01818 Encounter for other preprocedural examination: Secondary | ICD-10-CM

## 2014-03-11 LAB — CBC
HCT: 37.6 % — ABNORMAL LOW (ref 39.0–52.0)
Hemoglobin: 12.1 g/dL — ABNORMAL LOW (ref 13.0–17.0)
MCH: 28.7 pg (ref 26.0–34.0)
MCHC: 32.2 g/dL (ref 30.0–36.0)
MCV: 89.1 fL (ref 78.0–100.0)
Platelets: 138 10*3/uL — ABNORMAL LOW (ref 150–400)
RBC: 4.22 MIL/uL (ref 4.22–5.81)
RDW: 13.2 % (ref 11.5–15.5)
WBC: 6.7 10*3/uL (ref 4.0–10.5)

## 2014-03-11 LAB — BASIC METABOLIC PANEL
Anion gap: 10 (ref 5–15)
BUN: 19 mg/dL (ref 6–23)
CO2: 25 mEq/L (ref 19–32)
Calcium: 9.8 mg/dL (ref 8.4–10.5)
Chloride: 108 mEq/L (ref 96–112)
Creatinine, Ser: 1.23 mg/dL (ref 0.50–1.35)
GFR calc Af Amer: 65 mL/min — ABNORMAL LOW (ref >90)
GFR calc non Af Amer: 56 mL/min — ABNORMAL LOW (ref >90)
Glucose, Bld: 94 mg/dL (ref 70–99)
Potassium: 4 mEq/L (ref 3.7–5.3)
Sodium: 143 mEq/L (ref 137–147)

## 2014-03-11 LAB — T4, FREE: Free T4: 1.31 ng/dL (ref 0.80–1.80)

## 2014-03-11 LAB — MAGNESIUM: Magnesium: 1.8 mg/dL (ref 1.5–2.5)

## 2014-03-11 LAB — PHOSPHORUS: Phosphorus: 2.8 mg/dL (ref 2.3–4.6)

## 2014-03-11 MED ORDER — POLYETHYLENE GLYCOL 3350 17 G PO PACK
17.0000 g | PACK | Freq: Two times a day (BID) | ORAL | Status: DC
  Administered 2014-03-11: 17 g via ORAL
  Filled 2014-03-11 (×2): qty 1

## 2014-03-11 MED ORDER — SENNOSIDES-DOCUSATE SODIUM 8.6-50 MG PO TABS: 2.0000 | ORAL_TABLET | Freq: Every evening | ORAL | Status: DC | PRN

## 2014-03-11 MED ORDER — SENNOSIDES-DOCUSATE SODIUM 8.6-50 MG PO TABS
2.0000 | ORAL_TABLET | Freq: Two times a day (BID) | ORAL | Status: DC
Start: 2014-03-11 — End: 2014-03-11
  Administered 2014-03-11: 2 via ORAL
  Filled 2014-03-11 (×2): qty 2

## 2014-03-11 MED ORDER — POLYETHYLENE GLYCOL 3350 17 G PO PACK: 17.0000 g | PACK | Freq: Every day | ORAL | Status: DC | PRN

## 2014-03-11 NOTE — Progress Notes (Signed)
03/11/2014 4:39 PM Nursing note Discharge avs form, medications already taken today and those due this evening given and explained to patient. Follow up appointments and when to call MD reviewed. Pt. Pulled his own IV out. NT saw incident and noted catheter tips intact. D/c tele. D/c home with [facility] RN and PT. Per orders.  Kiaira Pointer, Arville Lime

## 2014-03-11 NOTE — Evaluation (Signed)
Physical Therapy Evaluation/ Discharge Patient Details Name: Fernando Carr MRN: [id number] DOB: 1940-08-02 Today's Date: 03/11/2014   History of Present Illness  73 y/o male, known h/o AAA diagnosed 2012, HLD, chronic Tob abuse, bipolar, came to [facility] from [employer] 12:00 pm after feeling faint and dizzy  Clinical Impression  Pt with bipolar d/o stating he was not feeding himself or performing home chores due to depression. Pt with baseline mobility status per pt without desire for further acute intervention. However, balance deficits noted and pt would benefit from HHPT for balance and safety eval which pt states he is agreeable to. Recommend ambulation with supervision acutely and potential need for meals on wheels or outside resources if pt cannot manage at home. Pt refuses ALF or SNF stating he has done that before. Discussed above with pt and RN. Will sign off for acute need with pt in agreement.    Follow Up Recommendations Home health PT (safety eval and for balance)    Equipment Recommendations  None recommended by PT    Recommendations for Other Services       Precautions / Restrictions Precautions Precautions: Fall      Mobility  Bed Mobility Overal bed mobility: Modified Independent                Transfers Overall transfer level: Modified independent                  Ambulation/Gait Ambulation/Gait assistance: Modified independent (Device/Increase time) Ambulation Distance (Feet): 400 Feet   Gait Pattern/deviations: Step-through pattern     General Gait Details: pt with slightly unsteady gait but no LOB with change of direction or head turns  Stairs Stairs: Yes Stairs assistance: Modified independent (Device/Increase time) Stair Management: One rail Right;Alternating pattern;Forwards Number of Stairs: 10 General stair comments: pt with catching toe on step x 1 but able to recover without assist  Wheelchair Mobility    Modified Rankin  (Stroke Patients Only)       Balance Overall balance assessment: Needs assistance   Sitting balance-Leahy Scale: Normal       Standing balance-Leahy Scale: Good   Single Leg Stance - Right Leg: 1 Single Leg Stance - Left Leg: 1     Rhomberg - Eyes Opened: 45 Rhomberg - Eyes Closed: 3   High Level Balance Comments: pt able to turn 360 degrees in less than 4 sec, pick object off the floor and perform Romberg eyes open all without LOB              Pertinent Vitals/Pain Pain Assessment: No/denies pain    Home Living Family/patient expects to be discharged to:: Private residence Living Arrangements: Alone   Type of Home: House Home Access: Stairs to enter   Secretary/administrator of Steps: 8 Home Layout: One level Home Equipment: None      Prior Function Level of Independence: Independent         Comments: Pt reports he is independent with ADLs and homemaking but also states he wasnt cleaning or cooking for the last 2 mo due to depression. pt with varied stories of whether or not he is able to care for himself at home but pt not receptive to ALF stating he did that previously after his suicide attempt     Hand Dominance        Extremity/Trunk Assessment   Upper Extremity Assessment: Overall WFL for tasks assessed           Lower  Extremity Assessment: Overall WFL for tasks assessed      Cervical / Trunk Assessment: Normal  Communication   Communication: No difficulties  Cognition Arousal/Alertness: Awake/alert Behavior During Therapy: WFL for tasks assessed/performed Overall Cognitive Status: History of cognitive impairments - at baseline                      General Comments      Exercises        Assessment/Plan    PT Assessment All further PT needs can be met in the next venue of care  PT Diagnosis Abnormality of gait   PT Problem List Decreased cognition;Decreased balance  PT Treatment Interventions     PT Goals (Current  goals can be found in the Care Plan section) Acute Rehab PT Goals PT Goal Formulation: No goals set, d/c therapy    Frequency     Barriers to discharge        Co-evaluation               End of Session   Activity Tolerance: Patient tolerated treatment well Patient left: in chair;with call bell/phone within reach Nurse Communication: Mobility status         Time: 8466-5993 PT Time Calculation (min): 17 min   Charges:   PT Evaluation $Initial PT Evaluation Tier I: 1 Procedure     PT G CodesMelford Aase 03/11/2014, 1:06 PM Elwyn Reach, [place]

## 2014-03-11 NOTE — Care Management Note (Signed)
    Page 1 of 1   03/11/2014     3:53:46 PM CARE MANAGEMENT NOTE 03/11/2014  Patient:  Fernando Carr, Fernando Carr   Account Number:  [id number]  Date Initiated:  03/11/2014  Documentation initiated by:  Brahim Dolman  Subjective/Objective Assessment:   Pt adm on 03/09/14 with near syncope,  aortic aneurysm.  PTA, pt independent, lives at home alone.     Action/Plan:   Pt for dc home today.  Will need HH follow up; referral to [facility], per pt choice.  Start of care 24-48h post dc date.   Anticipated DC Date:  03/11/2014   Anticipated DC Plan:  [place]  CM consult      [facility] Choice  HOME HEALTH   Choice offered to / List presented to:  C-1 Patient        [place] arranged  HH-1 RN  [place].   Status of service:  Completed, signed off Medicare Important Message given?  NA - LOS <3 / Initial given by admissions (If response is "NO", the following Medicare IM given date fields will be blank) Date Medicare IM given:   Medicare IM given by:   Date Additional Medicare IM given:   Additional Medicare IM given by:    Discharge Disposition:  [place]  Per UR Regulation:  Reviewed for med. necessity/level of care/duration of stay  If discussed at [place] of Stay Meetings, dates discussed:    Comments:

## 2014-03-11 NOTE — Discharge Instructions (Signed)
Call [facility] for Meals on Wheels:  Phone:  [phone]

## 2014-03-11 NOTE — Progress Notes (Signed)
Patient ID: Fernando Carr, male   DOB: Mar 22, 1941, 73 y.o.   MRN: [id number] Comfortable this morning. No abdominal pain. No further syncopal episodes. Again discussed the significance of this 5.4 centimeter aneurysm. Will require elective repair. Will arrange outpatient cardiology clearance and office visit for further discussion. Explained he is not a candidate for stent grafting D. to his juxtarenal aneurysm.  OK. per discharge from vascular standpoint.

## 2014-03-12 DIAGNOSIS — R627 Adult failure to thrive: Secondary | ICD-10-CM | POA: Diagnosis not present

## 2014-03-12 DIAGNOSIS — F17200 Nicotine dependence, unspecified, uncomplicated: Secondary | ICD-10-CM | POA: Diagnosis not present

## 2014-03-12 DIAGNOSIS — I951 Orthostatic hypotension: Secondary | ICD-10-CM | POA: Diagnosis not present

## 2014-03-12 DIAGNOSIS — I714 Abdominal aortic aneurysm, without rupture, unspecified: Secondary | ICD-10-CM | POA: Diagnosis not present

## 2014-03-12 DIAGNOSIS — F319 Bipolar disorder, unspecified: Secondary | ICD-10-CM | POA: Diagnosis not present

## 2014-03-12 DIAGNOSIS — I498 Other specified cardiac arrhythmias: Secondary | ICD-10-CM | POA: Diagnosis not present

## 2014-03-12 DIAGNOSIS — I723 Aneurysm of iliac artery: Secondary | ICD-10-CM | POA: Diagnosis not present

## 2014-03-12 DIAGNOSIS — N183 Chronic kidney disease, stage 3 unspecified: Secondary | ICD-10-CM | POA: Diagnosis not present

## 2014-03-13 DIAGNOSIS — N183 Chronic kidney disease, stage 3 unspecified: Secondary | ICD-10-CM | POA: Diagnosis not present

## 2014-03-13 DIAGNOSIS — I951 Orthostatic hypotension: Secondary | ICD-10-CM | POA: Diagnosis not present

## 2014-03-13 DIAGNOSIS — F319 Bipolar disorder, unspecified: Secondary | ICD-10-CM | POA: Diagnosis not present

## 2014-03-13 DIAGNOSIS — I723 Aneurysm of iliac artery: Secondary | ICD-10-CM | POA: Diagnosis not present

## 2014-03-13 DIAGNOSIS — I714 Abdominal aortic aneurysm, without rupture, unspecified: Secondary | ICD-10-CM | POA: Diagnosis not present

## 2014-03-13 DIAGNOSIS — I498 Other specified cardiac arrhythmias: Secondary | ICD-10-CM | POA: Diagnosis not present

## 2014-03-14 ENCOUNTER — Telehealth: Payer: Self-pay | Admitting: Vascular Surgery

## 2014-03-14 NOTE — Telephone Encounter (Signed)
Scheduled with Dr Acie Fredrickson at [facility] on 08/25 and to see TFE 04/01/2014- pts caretaker is aware, dpm

## 2014-03-14 NOTE — Discharge Summary (Signed)
Physician Discharge Summary  Fernando Carr Delay JME:[id number] DOB: 1940/10/21 DOA: 03/09/2014  PCP: [name], MD  Admit date: 03/09/2014 Discharge date: 03/11/2014  Time spent: 30 minutes  Recommendations for Outpatient Follow-up:  1. Follow up with VVS as recommended.  2. Follow up with cardiology as recommended.   Discharge Diagnoses:  Active Problems:   Hyperlipemia   Bipolar 1 disorder   Abdominal aneurysm without mention of rupture   CRD (chronic renal disease), stage III   Hypotension, postural   AKI (acute kidney injury)   Dehydration   FTT (failure to thrive) in adult   Aneurysm of right iliac artery   Sinus bradycardia   HTN (hypertension)   Discharge Condition: improved.   Diet recommendation: low sodium diet.   Filed Weights   03/09/14 1634 03/10/14 0413  Weight: 102.059 kg (225 lb) 97.8 kg (215 lb 9.8 oz)    History of present illness:  73 year old male patient with known history of abdominal aortic aneurysm as well as bipolar disorder. Presented to the hospital after experiencing weakness with faintness and dizziness. Apparently had been having these symptoms for several months. He also endorsed unintentional weight loss. No other symptoms.  Upon presentation to the ER his blood pressure was low at 90/60 and he was given IV fluids with improvement in his blood pressure. Due to his known history of AAA and presentation with hypotension and near syncope there were concerns that he may have dissected the aneurysm but CT of the abdomen and chest revealed no aortic dissection but significant enlargement in the aneurysm since last evaluated several years previous. In addition he had mild renal insufficiency. His lactic acid was also mildly elevated at 2.17. VVS was consulted with plans to evaluate the patient after admission to the hospital.      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 Plan for elective repair after cardiology clearance as outpatient.   Hypotension, postural/Dehydration  -Due to poor intake prior to admission-continue IV fluid  Appropriate hydration. And his hypotension resolved. Pt EVAL recommended home health PT. Patient did nto want to go to SNF or ALF.   AKI (acute kidney injury)/CRD (chronic renal disease), stage III  -Etiology due to dehydration preadmission - follow renal function closely given AAA with significant mural thrombus . Improved with hydration.  Abnormal TSH  -TSH 0.301 , normal free t4.  FTT (failure to thrive) in adult  -As above  -Likely will need placement upon discharge  Sinus bradycardia  Asymptomatic. Hyperlipemia  -On fenofibrate as well as simvastatin prior to admission  Bipolar 1 disorder  -Continue home medications  HTN  Controlled.  -ECHO this admit c/w preserved LV fnx and grade 1 DD      Procedures: echo  Consultations:  VVS  Discharge Exam: Filed Vitals:   03/11/14 1316  BP: 117/64  Pulse: 61  Temp: 97.7 F (36.5 C)  Resp: 18    General: alert afebrile comfortable Cardiovascular: s1s2 Respiratory: ctab  Discharge Instructions You were cared for by a hospitalist during your hospital stay. If you have any questions about your discharge medications or the care you received while you were in the hospital after you are discharged, you can call the unit and asked to speak with the hospitalist on call if the hospitalist that took care of you is not available. Once you are discharged, your primary care physician will handle any further medical  issues. Please note that NO REFILLS for any discharge medications will be authorized once you are discharged, as it is imperative that you return to your primary care physician (or establish a relationship with a primary care physician if you do not have one) for your aftercare needs so that they can reassess your need for  medications and monitor your lab values.  Discharge Instructions   Discharge instructions    Complete by:  As directed   Follow up with vascular surgery as recommended.            Medication List         escitalopram 10 MG tablet  Commonly known as:  LEXAPRO  Take 10 mg by mouth daily.     fenofibrate micronized 67 MG capsule  Commonly known as:  LOFIBRA  Take 67 mg by mouth daily.     polyethylene glycol packet  Commonly known as:  MIRALAX / GLYCOLAX  Take 17 g by mouth daily as needed.     risperiDONE 1 MG tablet  Commonly known as:  RISPERDAL  Take 1 tablet (1 mg total) by mouth at bedtime.     senna-docusate 8.6-50 MG per tablet  Commonly known as:  Senokot-S  Take 2 tablets by mouth at bedtime as needed for mild constipation.     simvastatin 20 MG tablet  Commonly known as:  ZOCOR  Take 10 mg by mouth daily.       No Known Allergies    The results of significant diagnostics from this hospitalization (including imaging, microbiology, ancillary and laboratory) are listed below for reference.    Significant Diagnostic Studies: Ct Angio Chest W/cm &/or Wo Cm  03/09/2014   CLINICAL DATA:  Near syncope. Hypertension. Known abdominal aortic aneurysm. Patient reports no pain in the chest or abdomen.  EXAM: CT ANGIOGRAPHY CHEST  CT ABDOMEN AND PELVIS WITH CONTRAST  TECHNIQUE: Multidetector CT imaging of the chest was performed using the standard protocol during bolus administration of intravenous contrast. Multiplanar CT image reconstructions and MIPs were obtained to evaluate the vascular anatomy. Multidetector CT imaging of the abdomen and pelvis was performed using the standard protocol during bolus administration of intravenous contrast.  CONTRAST:  17mL OMNIPAQUE IOHEXOL 350 MG/ML SOLN  COMPARISON:  CT of the abdomen and pelvis on 06/05/2005  FINDINGS: CTA CHEST FINDINGS  Heart: Heart is normal in size. Coronary vascular calcification is present. No pericardial  effusion.  Vascular structures: The pulmonary arteries are well opacified. There is no evidence for acute pulmonary embolus. There is atherosclerotic calcification and mural thrombus involving the thoracic aorta. No aneurysm or dissection.  Mediastinum/thyroid: The visualized portion of the thyroid gland has a normal appearance. No mediastinal, hilar, or axillary adenopathy.  Lungs: No pulmonary nodules, pleural effusions, or infiltrates.  Chest wall/osseous structures: No suspicious lytic or blastic lesions.  CT ABDOMEN and PELVIS FINDINGS  Upper abdomen: No focal abnormality identified within the liver, spleen, pancreas, or right adrenal gland. Small left adrenal nodule is 1.6 cm and stable in appearance, consistent with benign process. The gallbladder is present. Right renal cysts are present. No hydronephrosis.  Bowel: The stomach and small bowel loops are normal in appearance. No acute abnormality of the colonic loops. The appendix is well seen and has a normal appearance. No significant adenopathy.  Urinary bladder, prostate, and seminal vesicles have a normal appearance.  Vascular: There is an infrarenal abdominal aortic aneurysm. This measures maximum 5.4 x 4.8 cm. There is significant mural thrombus.  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 There is short segment dissection of the right iliac artery. There is significant atherosclerotic calcification of both iliac branches.  Abdominal wall: Unremarkable.  Osseous structures: Significant degenerative changes without suspicious lytic or blastic lesions.  Review of the MIP images confirms the above findings.  IMPRESSION: 1. Technically adequate exam showing no pulmonary embolus. 2. Atherosclerotic calcification of the thoracic aorta. 3. Abdominal aortic aneurysm measuring 5.4 x 4.8 cm with significant progression since 2006 study. 4. 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 5. A benign left adrenal nodule 1.6 cm. 6. Normal  appendix.   Electronically Signed   By: Shon Hale M.D.   On: 03/09/2014 16:29   Ct Abdomen Pelvis W Contrast  03/09/2014   CLINICAL DATA:  Near syncope. Hypertension. Known abdominal aortic aneurysm. Patient reports no pain in the chest or abdomen.  EXAM: CT ANGIOGRAPHY CHEST  CT ABDOMEN AND PELVIS WITH CONTRAST  TECHNIQUE: Multidetector CT imaging of the chest was performed using the standard protocol during bolus administration of intravenous contrast. Multiplanar CT image reconstructions and MIPs were obtained to evaluate the vascular anatomy. Multidetector CT imaging of the abdomen and pelvis was performed using the standard protocol during bolus administration of intravenous contrast.  CONTRAST:  156mL OMNIPAQUE IOHEXOL 350 MG/ML SOLN  COMPARISON:  CT of the abdomen and pelvis on 06/05/2005  FINDINGS: CTA CHEST FINDINGS  Heart: Heart is normal in size. Coronary vascular calcification is present. No pericardial effusion.  Vascular structures: The pulmonary arteries are well opacified. There is no evidence for acute pulmonary embolus. There is atherosclerotic calcification and mural thrombus involving the thoracic aorta. No aneurysm or dissection.  Mediastinum/thyroid: The visualized portion of the thyroid gland has a normal appearance. No mediastinal, hilar, or axillary adenopathy.  Lungs: No pulmonary nodules, pleural effusions, or infiltrates.  Chest wall/osseous structures: No suspicious lytic or blastic lesions.  CT ABDOMEN and PELVIS FINDINGS  Upper abdomen: No focal abnormality identified within the liver, spleen, pancreas, or right adrenal gland. Small left adrenal nodule is 1.6 cm and stable in appearance, consistent with benign process. The gallbladder is present. Right renal cysts are present. No hydronephrosis.  Bowel: The stomach and small bowel loops are normal in appearance. No acute abnormality of the colonic loops. The appendix is well seen and has a normal appearance. No significant  adenopathy.  Urinary bladder, prostate, and seminal vesicles have a normal appearance.  Vascular: There is an infrarenal abdominal aortic aneurysm. This measures maximum 5.4 x 4.8 cm. There is significant mural thrombus. 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 There is short segment dissection of the right iliac artery. There is significant atherosclerotic calcification of both iliac branches.  Abdominal wall: Unremarkable.  Osseous structures: Significant degenerative changes without suspicious lytic or blastic lesions.  Review of the MIP images confirms the above findings.  IMPRESSION: 1. Technically adequate exam showing no pulmonary embolus. 2. Atherosclerotic calcification of the thoracic aorta. 3. Abdominal aortic aneurysm measuring 5.4 x 4.8 cm with significant progression since 2006 study. 4. 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 5. A benign left adrenal nodule 1.6 cm. 6. Normal appendix.   Electronically Signed   By: Shon Hale M.D.   On: 03/09/2014 16:29    Microbiology: Recent Results (from the past 240 hour(s))  MRSA PCR SCREENING     Status: None   Collection Time    03/09/14  6:36 PM      Result Value  Ref Range Status   MRSA by PCR NEGATIVE  NEGATIVE Final   Comment:            The GeneXpert MRSA Assay (FDA     approved for NASAL specimens     only), is one component of a     comprehensive MRSA colonization     surveillance program. It is not     intended to diagnose MRSA     infection nor to guide or     monitor treatment for     MRSA infections.     Labs: Basic Metabolic Panel:  Recent Labs Lab 03/09/14 1438 03/10/14 0255 03/11/14 0338  NA 141 145 143  K 4.1 3.7 4.0  CL 104 109 108  CO2 23 24 25   GLUCOSE 111* 82 94  BUN 24* 21 19  CREATININE 1.75* 1.34 1.23  CALCIUM 10.9* 10.3 9.8  MG  --   --  1.8  PHOS  --   --  2.8   Liver Function Tests:  Recent Labs Lab 03/09/14 1438 03/10/14 0255  AST 10 10  ALT 13 11  ALKPHOS  87 82  BILITOT 0.3 0.2*  PROT 6.7 6.0  ALBUMIN 3.6 3.3*   No results found for this basename: LIPASE, AMYLASE,  in the last 168 hours No results found for this basename: AMMONIA,  in the last 168 hours CBC:  Recent Labs Lab 03/09/14 1438 03/10/14 0255 03/11/14 0338  WBC 7.5 6.3 6.7  NEUTROABS 5.8  --   --   HGB 13.3 12.2* 12.1*  HCT 40.1 37.3* 37.6*  MCV 89.3 89.4 89.1  PLT 150 142* 138*   Cardiac Enzymes: No results found for this basename: CKTOTAL, CKMB, CKMBINDEX, TROPONINI,  in the last 168 hours BNP: BNP (last 3 results) No results found for this basename: PROBNP,  in the last 8760 hours CBG: No results found for this basename: GLUCAP,  in the last 168 hours     Signed:  Zedrick Springsteen  [facility] 03/11/2014, 3:02 PM

## 2014-03-14 NOTE — Telephone Encounter (Signed)
Message copied by Gena Fray on Fri Mar 14, 2014 11:41 AM ------      Message from: Gregery Na      Created: Tue Mar 11, 2014  9:53 AM       Per Dr. Donnetta Hutching, pt. needs office visit to discuss surgery for open AAA. Also, will need cardiac clearance appt. He is currently an inpatient on 2W06. Thanks!  ------

## 2014-03-15 DIAGNOSIS — F319 Bipolar disorder, unspecified: Secondary | ICD-10-CM | POA: Diagnosis not present

## 2014-03-15 DIAGNOSIS — I714 Abdominal aortic aneurysm, without rupture, unspecified: Secondary | ICD-10-CM | POA: Diagnosis not present

## 2014-03-15 DIAGNOSIS — I723 Aneurysm of iliac artery: Secondary | ICD-10-CM | POA: Diagnosis not present

## 2014-03-15 DIAGNOSIS — I951 Orthostatic hypotension: Secondary | ICD-10-CM | POA: Diagnosis not present

## 2014-03-15 DIAGNOSIS — N183 Chronic kidney disease, stage 3 unspecified: Secondary | ICD-10-CM | POA: Diagnosis not present

## 2014-03-15 DIAGNOSIS — I498 Other specified cardiac arrhythmias: Secondary | ICD-10-CM | POA: Diagnosis not present

## 2014-03-17 DIAGNOSIS — I951 Orthostatic hypotension: Secondary | ICD-10-CM | POA: Diagnosis not present

## 2014-03-17 DIAGNOSIS — I723 Aneurysm of iliac artery: Secondary | ICD-10-CM | POA: Diagnosis not present

## 2014-03-17 DIAGNOSIS — N183 Chronic kidney disease, stage 3 unspecified: Secondary | ICD-10-CM | POA: Diagnosis not present

## 2014-03-17 DIAGNOSIS — I714 Abdominal aortic aneurysm, without rupture, unspecified: Secondary | ICD-10-CM | POA: Diagnosis not present

## 2014-03-17 DIAGNOSIS — I498 Other specified cardiac arrhythmias: Secondary | ICD-10-CM | POA: Diagnosis not present

## 2014-03-17 DIAGNOSIS — F319 Bipolar disorder, unspecified: Secondary | ICD-10-CM | POA: Diagnosis not present

## 2014-03-22 DIAGNOSIS — I723 Aneurysm of iliac artery: Secondary | ICD-10-CM | POA: Diagnosis not present

## 2014-03-22 DIAGNOSIS — I498 Other specified cardiac arrhythmias: Secondary | ICD-10-CM | POA: Diagnosis not present

## 2014-03-22 DIAGNOSIS — I951 Orthostatic hypotension: Secondary | ICD-10-CM | POA: Diagnosis not present

## 2014-03-22 DIAGNOSIS — F319 Bipolar disorder, unspecified: Secondary | ICD-10-CM | POA: Diagnosis not present

## 2014-03-22 DIAGNOSIS — N183 Chronic kidney disease, stage 3 unspecified: Secondary | ICD-10-CM | POA: Diagnosis not present

## 2014-03-22 DIAGNOSIS — I714 Abdominal aortic aneurysm, without rupture, unspecified: Secondary | ICD-10-CM | POA: Diagnosis not present

## 2014-03-25 ENCOUNTER — Encounter (HOSPITAL_COMMUNITY): Payer: Self-pay | Admitting: Psychiatry

## 2014-03-25 ENCOUNTER — Ambulatory Visit (INDEPENDENT_AMBULATORY_CARE_PROVIDER_SITE_OTHER): Payer: Medicare Other | Admitting: Cardiovascular Disease

## 2014-03-25 ENCOUNTER — Ambulatory Visit (INDEPENDENT_AMBULATORY_CARE_PROVIDER_SITE_OTHER): Payer: Medicare Other | Admitting: Psychiatry

## 2014-03-25 ENCOUNTER — Encounter: Payer: Self-pay | Admitting: Cardiovascular Disease

## 2014-03-25 VITALS — BP 87/55 | HR 71 | Ht 72.0 in | Wt 218.8 lb

## 2014-03-25 VITALS — BP 76/52 | HR 70 | Ht 72.0 in | Wt 219.0 lb

## 2014-03-25 DIAGNOSIS — I714 Abdominal aortic aneurysm, without rupture, unspecified: Secondary | ICD-10-CM

## 2014-03-25 DIAGNOSIS — I951 Orthostatic hypotension: Secondary | ICD-10-CM

## 2014-03-25 DIAGNOSIS — F319 Bipolar disorder, unspecified: Secondary | ICD-10-CM | POA: Diagnosis not present

## 2014-03-25 MED ORDER — ESCITALOPRAM OXALATE 20 MG PO TABS: 20.0000 mg | ORAL_TABLET | Freq: Every day | ORAL | Status: DC

## 2014-03-25 MED ORDER — RISPERIDONE 1 MG PO TABS: 1.0000 mg | ORAL_TABLET | Freq: Every day | ORAL | Status: DC

## 2014-03-25 NOTE — Patient Instructions (Addendum)
Your physician recommends that you eat 3 healthy meals per day including fresh fruits and vegetables, lean protein such as skinless chicken, fish, and hard boiled eggs and whole grains such as whole wheat bread. You should drink at least 64 oz of water every day.  Your physician recommends that you follow-up in: 1 week for BP check/nurse visit on Wed. Sept. 2 at 11:00  Your physician has requested that you have a lexiscan myoview if systolic BP is greater than 100. For further information please visit [email or web address]. Please follow instruction sheet, as given.

## 2014-03-25 NOTE — Progress Notes (Signed)
Alfredo Batty Cooley Date of Birth  May 25, 1941       [facility]    [employer] [address], [address], [place], [place]  [address]   [place], [place]  [address] [place]   Fax  [phone]     Fax [phone]  Problem List: 1. Abdominal aortic aneurysm 2. Bipolar disorder 3.  chronic kidney disease 4. hypotension  History of Present Illness:  Daquavion was admitted to the hospital in early August with weakness and dizziness. He was found to have hypotension. He was hydrated. It felt somewhat better. He returns today at the request Dr. Mortimer Fries for preoperative visit. His aneurysm has grown and he'll need to have surgery soon.   infrarenal abdominal aortic aneurysm. This  measures maximum 5.4 x 4.8 cm   He admits to still not eating very well.  Does not cook himself.  Heats up food in the microwave.  He ate breakfast today  and has not had anything else today ( current time is 4:50 PM)   breakfast - eggs, potatoes, sausage ( pre-packaged [employer] meal)  Lunch - sandwich,  But does not eat regularly. Dinner - frozen hungry man dinner ,    Current Outpatient Prescriptions on File Prior to Visit  Medication Sig Dispense Refill  . escitalopram (LEXAPRO) 20 MG tablet Take 1 tablet (20 mg total) by mouth daily.  30 tablet  0  . fenofibrate micronized (LOFIBRA) 67 MG capsule Take 67 mg by mouth daily.       . risperiDONE (RISPERDAL) 1 MG tablet Take 1 tablet (1 mg total) by mouth at bedtime.  30 tablet  0  . simvastatin (ZOCOR) 20 MG tablet Take 10 mg by mouth daily.        No current facility-administered medications on file prior to visit.    No Known Allergies  Past Medical History  Diagnosis Date  . High cholesterol   . Bipolar disorder   . AAA (abdominal aortic aneurysm)   . Depression   . Renal insufficiency     chronic renal   . Suicide attempt aug. 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 Knee arthroscopy Left 1972    History  Smoking status  . Current Every Day Smoker -- 1.00 packs/day for 40 years  . Types: Cigarettes  Smokeless tobacco  . Never Used    History  Alcohol Use No    Family History  Problem Relation Age of Onset  . Alcohol abuse Mother   . Alcohol abuse Father   . Suicidality Paternal Uncle   . Suicidality Cousin   . Depression Daughter     Reviw of Systems:  Reviewed in the HPI.  All other systems are negative.  Physical Exam: Blood pressure 76/52, pulse 70, height 6' (1.829 m), weight 219 lb (99.338 kg). Wt Readings from Last 3 Encounters:  03/25/14 219 lb (99.338 kg)  03/25/14 218 lb 12.8 oz (99.247 kg)  03/10/14 215 lb 9.8 oz (97.8 kg)     General: Well developed, well nourished, in no acute distress.  Head: Normocephalic, atraumatic, sclera non-icteric, mucus membranes are moist,   Neck: Supple. Carotids are 2 + without bruits. No JVD   Lungs: Clear   Heart: RR, normal S1S2  Abdomen: Soft, non-tender, non-distended with normal bowel sounds.  Msk:  Strength and tone are normal   Extremities: No clubbing or cyanosis. No edema.  Distal pedal pulses are 2+ and equal    Neuro: CN II - XII intact.  Alert and oriented X 3.   Psych:  Normal   ECG: 03/04/2014 total, normal sinus rhythm. He has poor R-wave progression   Assessment / Plan:

## 2014-03-25 NOTE — Assessment & Plan Note (Signed)
His abdominal aortic aneurysm has grown since his previous study in the fall of 2014. He will need a lexiscan myoview for pre-op evaluation but I would like to wait until his BP is higher.  Will see him back for a nurse visit soon and get the [facility] as soon as his BP stabilizes.

## 2014-03-25 NOTE — Progress Notes (Signed)
[facility] Progress Note  Donovyn Guidice Lukasiewicz [id number] y.o.  03/25/2014 4:54 PM  Chief Complaint:  I like Risperdal.  I'm feeling better.  I'm less anxious.           History of Present Illness: Fernando Carr came for his followup appointment.  He was recently admitted on the medical floor because of dehydration .  He has lost a lot of weight .  He admitted sometime frustrated with his physical illness.  He continued to have chronic depression and sometimes feels sad and depressed and passive suicidal thoughts.  However he contracts for safety.  He is not taking Lexapro 20 mg despite the dose was increased on the last visit.  He is scheduled to see multiple doctors for his health needs.  He is sleeping on and off.  His daughter is not asking for money and he is relieved.  He is also does not want to continue the lease for his friend who is not paying the rent.  Patient admitted some time that he is trapped by his friend.  He endorses financial restrain.  His admitted poor appetite and decreased energy but could be due to recent dehydration and hospital stay.  However he is not keeping up appointment with the doctors.  He is taking Risperdal.  He denies any side effects.  He denies any hallucination or any paranoia.  Suicidal Ideation: No Plan Formed: No Patient has means to carry out plan: No  Homicidal Ideation: No Plan Formed: No Patient has means to carry out plan: No  Review of Systems: Psychiatric: Agitation: No Hallucination: No Depressed Mood: Yes Insomnia: Yes Hypersomnia: No Altered Concentration: No Feels Worthless: Yes Grandiose Ideas: No Belief In Special Powers: No New/Increased Substance Abuse: No Compulsions: No  Neurologic: Headache: No Seizure: No Paresthesias: No  Medical History:  Patient has a history of hyperlipidemia, obesity and recently started seeing Dr. Deforest Hoyles at [facility] physician.    Outpatient Encounter Prescriptions as of 03/25/2014   Medication Sig  . escitalopram (LEXAPRO) 20 MG tablet Take 1 tablet (20 mg total) by mouth daily.  . fenofibrate micronized (LOFIBRA) 67 MG capsule Take 67 mg by mouth daily.   . risperiDONE (RISPERDAL) 1 MG tablet Take 1 tablet (1 mg total) by mouth at bedtime.  . simvastatin (ZOCOR) 20 MG tablet Take 10 mg by mouth daily.   . [DISCONTINUED] escitalopram (LEXAPRO) 10 MG tablet Take 10 mg by mouth daily.  . [DISCONTINUED] polyethylene glycol (MIRALAX / GLYCOLAX) packet Take 17 g by mouth daily as needed.  . [DISCONTINUED] risperiDONE (RISPERDAL) 1 MG tablet Take 1 tablet (1 mg total) by mouth at bedtime.  . [DISCONTINUED] senna-docusate (SENOKOT-S) 8.6-50 MG per tablet Take 2 tablets by mouth at bedtime as needed for mild constipation.   Recent Results (from the past 2160 hour(s))  CBC WITH DIFFERENTIAL     Status: Abnormal   Collection Time    03/09/14  2:38 PM      Result Value Ref Range   WBC 7.5  4.0 - 10.5 K/uL   RBC 4.49  4.22 - 5.81 MIL/uL   Hemoglobin 13.3  13.0 - 17.0 g/dL   HCT 40.1  39.0 - 52.0 %   MCV 89.3  78.0 - 100.0 fL   MCH 29.6  26.0 - 34.0 pg   MCHC 33.2  30.0 - 36.0 g/dL   RDW 13.0  11.5 - 15.5 %   Platelets 150  150 - 400 K/uL  Neutrophils Relative % 79 (*) 43 - 77 %   Neutro Abs 5.8  1.7 - 7.7 K/uL   Lymphocytes Relative 15  12 - 46 %   Lymphs Abs 1.2  0.7 - 4.0 K/uL   Monocytes Relative 6  3 - 12 %   Monocytes Absolute 0.5  0.1 - 1.0 K/uL   Eosinophils Relative 0  0 - 5 %   Eosinophils Absolute 0.0  0.0 - 0.7 K/uL   Basophils Relative 0  0 - 1 %   Basophils Absolute 0.0  0.0 - 0.1 K/uL  COMPREHENSIVE METABOLIC PANEL     Status: Abnormal   Collection Time    03/09/14  2:38 PM      Result Value Ref Range   Sodium 141  137 - 147 mEq/L   Potassium 4.1  3.7 - 5.3 mEq/L   Chloride 104  96 - 112 mEq/L   CO2 23  19 - 32 mEq/L   Glucose, Bld 111 (*) 70 - 99 mg/dL   BUN 24 (*) 6 - 23 mg/dL   Creatinine, Ser 1.75 (*) 0.50 - 1.35 mg/dL   Calcium 10.9 (*) 8.4  - 10.5 mg/dL   Total Protein 6.7  6.0 - 8.3 g/dL   Albumin 3.6  3.5 - 5.2 g/dL   AST 10  0 - 37 U/L   ALT 13  0 - 53 U/L   Alkaline Phosphatase 87  39 - 117 U/L   Total Bilirubin 0.3  0.3 - 1.2 mg/dL   GFR calc non Af Amer 37 (*) >90 mL/min   GFR calc Af Amer 43 (*) >90 mL/min   Comment: (NOTE)     The eGFR has been calculated using the CKD EPI equation.     This calculation has not been validated in all clinical situations.     eGFR's persistently <90 mL/min signify possible Chronic Kidney     Disease.   Anion gap 14  5 - 15  I-STAT TROPOININ, ED     Status: None   Collection Time    03/09/14  2:43 PM      Result Value Ref Range   Troponin i, poc 0.01  0.00 - 0.08 ng/mL   Comment 3            Comment: Due to the release kinetics of cTnI,     a negative result within the first hours     of the onset of symptoms does not rule out     myocardial infarction with certainty.     If myocardial infarction is still suspected,     repeat the test at appropriate intervals.  I-STAT CG4 LACTIC ACID, ED     Status: None   Collection Time    03/09/14  3:16 PM      Result Value Ref Range   Lactic Acid, Venous 2.17  0.5 - 2.2 mmol/L  URINALYSIS, ROUTINE W REFLEX MICROSCOPIC     Status: Abnormal   Collection Time    03/09/14  3:23 PM      Result Value Ref Range   Color, Urine AMBER (*) YELLOW   Comment: BIOCHEMICALS MAY BE AFFECTED BY COLOR   APPearance CLOUDY (*) CLEAR   Specific Gravity, Urine 1.019  1.005 - 1.030   pH 7.5  5.0 - 8.0   Glucose, UA NEGATIVE  NEGATIVE mg/dL   Hgb urine dipstick NEGATIVE  NEGATIVE   Bilirubin Urine NEGATIVE  NEGATIVE   Ketones, ur NEGATIVE  NEGATIVE mg/dL   Protein, ur 100 (*) NEGATIVE mg/dL   Urobilinogen, UA 2.0 (*) 0.0 - 1.0 mg/dL   Nitrite NEGATIVE  NEGATIVE   Leukocytes, UA NEGATIVE  NEGATIVE  URINE MICROSCOPIC-ADD ON     Status: None   Collection Time    03/09/14  3:23 PM      Result Value Ref Range   Squamous Epithelial / LPF RARE  RARE    WBC, UA 0-2  <3 WBC/hpf   Urine-Other AMORPHOUS URATES/PHOSPHATES    MRSA PCR SCREENING     Status: None   Collection Time    03/09/14  6:36 PM      Result Value Ref Range   MRSA by PCR NEGATIVE  NEGATIVE   Comment:            The GeneXpert MRSA Assay (FDA     approved for NASAL specimens     only), is one component of a     comprehensive MRSA colonization     surveillance program. It is not     intended to diagnose MRSA     infection nor to guide or     monitor treatment for     MRSA infections.  TSH     Status: Abnormal   Collection Time    03/10/14  2:55 AM      Result Value Ref Range   TSH 0.301 (*) 0.350 - 4.500 uIU/mL  COMPREHENSIVE METABOLIC PANEL     Status: Abnormal   Collection Time    03/10/14  2:55 AM      Result Value Ref Range   Sodium 145  137 - 147 mEq/L   Potassium 3.7  3.7 - 5.3 mEq/L   Chloride 109  96 - 112 mEq/L   CO2 24  19 - 32 mEq/L   Glucose, Bld 82  70 - 99 mg/dL   BUN 21  6 - 23 mg/dL   Creatinine, Ser 1.34  0.50 - 1.35 mg/dL   Calcium 10.3  8.4 - 10.5 mg/dL   Total Protein 6.0  6.0 - 8.3 g/dL   Albumin 3.3 (*) 3.5 - 5.2 g/dL   AST 10  0 - 37 U/L   ALT 11  0 - 53 U/L   Alkaline Phosphatase 82  39 - 117 U/L   Total Bilirubin 0.2 (*) 0.3 - 1.2 mg/dL   GFR calc non Af Amer 51 (*) >90 mL/min   GFR calc Af Amer 59 (*) >90 mL/min   Comment: (NOTE)     The eGFR has been calculated using the CKD EPI equation.     This calculation has not been validated in all clinical situations.     eGFR's persistently <90 mL/min signify possible Chronic Kidney     Disease.   Anion gap 12  5 - 15  PROTIME-INR     Status: None   Collection Time    03/10/14  2:55 AM      Result Value Ref Range   Prothrombin Time 13.7  11.6 - 15.2 seconds   INR 1.05  0.00 - 1.49  CBC     Status: Abnormal   Collection Time    03/10/14  2:55 AM      Result Value Ref Range   WBC 6.3  4.0 - 10.5 K/uL   RBC 4.17 (*) 4.22 - 5.81 MIL/uL   Hemoglobin 12.2 (*) 13.0 - 17.0 g/dL    HCT 37.3 (*) 39.0 - 52.0 %   MCV  89.4  78.0 - 100.0 fL   MCH 29.3  26.0 - 34.0 pg   MCHC 32.7  30.0 - 36.0 g/dL   RDW 13.3  11.5 - 15.5 %   Platelets 142 (*) 150 - 400 K/uL  T4, FREE     Status: None   Collection Time    03/11/14  3:38 AM      Result Value Ref Range   Free T4 1.31  0.80 - 1.80 ng/dL   Comment: Performed at [facility]     Status: Abnormal   Collection Time    03/11/14  3:38 AM      Result Value Ref Range   Sodium 143  137 - 147 mEq/L   Potassium 4.0  3.7 - 5.3 mEq/L   Chloride 108  96 - 112 mEq/L   CO2 25  19 - 32 mEq/L   Glucose, Bld 94  70 - 99 mg/dL   BUN 19  6 - 23 mg/dL   Creatinine, Ser 1.23  0.50 - 1.35 mg/dL   Calcium 9.8  8.4 - 10.5 mg/dL   GFR calc non Af Amer 56 (*) >90 mL/min   GFR calc Af Amer 65 (*) >90 mL/min   Comment: (NOTE)     The eGFR has been calculated using the CKD EPI equation.     This calculation has not been validated in all clinical situations.     eGFR's persistently <90 mL/min signify possible Chronic Kidney     Disease.   Anion gap 10  5 - 15  CBC     Status: Abnormal   Collection Time    03/11/14  3:38 AM      Result Value Ref Range   WBC 6.7  4.0 - 10.5 K/uL   RBC 4.22  4.22 - 5.81 MIL/uL   Hemoglobin 12.1 (*) 13.0 - 17.0 g/dL   HCT 37.6 (*) 39.0 - 52.0 %   MCV 89.1  78.0 - 100.0 fL   MCH 28.7  26.0 - 34.0 pg   MCHC 32.2  30.0 - 36.0 g/dL   RDW 13.2  11.5 - 15.5 %   Platelets 138 (*) 150 - 400 K/uL  MAGNESIUM     Status: None   Collection Time    03/11/14  3:38 AM      Result Value Ref Range   Magnesium 1.8  1.5 - 2.5 mg/dL  PHOSPHORUS     Status: None   Collection Time    03/11/14  3:38 AM      Result Value Ref Range   Phosphorus 2.8  2.3 - 4.6 mg/dL    Past Psychiatric History/Hospitalization(s): Patient has at least one psychiatric inpatient treatment in 2006 detail suicidal attempt. He injected Ethyline Glycol. He suffered acute renal failure and required dialysis at that  time.  He suffered significant lost including his property and he was feeling hopeless helpless and having paranoid thoughts.  Recently they had tried Abilify but he developed tremors and worsening of the symptoms.  He had a good response with Risperdal in the past.    Anxiety: No Bipolar Disorder: No Depression: Yes Mania: No Psychosis: Yes Schizophrenia: No Personality Disorder: No Hospitalization for psychiatric illness: Yes History of Electroconvulsive Shock Therapy: No Prior Suicide Attempts: Yes  Physical Exam: Constitutional:  BP 87/55  Pulse 71  Ht 6' (1.829 m)  Wt 218 lb 12.8 oz (99.247 kg)  BMI 29.67 kg/m2  General Appearance: alert, oriented,  no acute distress, well nourished, obese and Maintain fair eye contact  Review of Systems  Musculoskeletal:       Leg pain  Neurological: Negative.    Musculoskeletal: Strength & Muscle Tone: within normal limits Gait & Station: unsteady Patient leans: N/A  Mental status examination : Patient is casually dressed and fairly groomed.  He has lost weight from the past.  He appears tired .  His speech is slow , clear and coherent.  He is anxious but cooperative.  His thought process is slow but logical and goal-directed.  He denies any paranoia or any hallucination.  He denies any active or passive suicidal thoughts or homicidal thoughts.  There were no flight of ideas or any loose association.  He described his mood is depressed and sad and his affect is constricted.  There were no delusions or any paranoia .  There were no obsessive thoughts.  His psychomotor activity is slow.  His fund of knowledge is fair.  His attention and concentration is fair.  He is alert and oriented x3.  His insight judgment and impulse control is okay.  Established Problem, Stable/Improving (1), Review of Psycho-Social Stressors (1), Review or order clinical lab tests (1), Decision to obtain old records (1), Review of Last Therapy Session (1) and Review of  Medication Regimen & Side Effects (2)  Assessment: Axis I: Bipolar disorder type I   Axis II: Deferred  Axis III:  Patient Active Problem List   Diagnosis Date Noted  . AKI (acute kidney injury) 03/10/2014  . Dehydration 03/10/2014  . FTT (failure to thrive) in adult 03/10/2014  . 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 Sinus bradycardia 03/10/2014  . HTN (hypertension) 03/10/2014  . Hypotension, postural 03/09/2014  . CRD (chronic renal disease), stage III 01/09/2014  . Abdominal aneurysm without mention of rupture 12/20/2011  . Bipolar 1 disorder 10/24/2011  . Hyperlipemia 10/10/2011    Axis IV: Mild  Axis V: 60-65   Plan: Reinforced to take Lexapro 20 mg daily and continue Risperdal as prescribed.  One more time recommended counseling but patient declined.  He like to see his other doctors at this time because he has a lot of health issues.  I reviewed blood work results, discharge summary from the hospital.  I will see him again in 2 weeks however recommended to call us back if he feels worsening of her symptoms.  Time spent 25 minutes.  More than 50% of the time spent in psychoeducation, counseling and coordination of care.  Discuss safety plan that anytime having active suicidal thoughts or homicidal thoughts then patient need to call 911 or go to the local emergency room.  [place] Bohac T., MD 03/25/2014

## 2014-03-25 NOTE — Assessment & Plan Note (Addendum)
Pt presents for preoperative evaluation prior to having abdominal aortic aneurysm repair. Unfortunately, his blood pressure is quite low density. He has not eaten well today. In fact he is only eating breakfast and has not had anything to eat since that time.  His BP is too low to safely do a [employer] .  He has several potential causes of his low blood pressure including poor by mouth intake and his Rispirdal therapy.  I have placed a call to his psychiatrist to see if there is another antidepressant that we can use that will not cause hypotension.    We could try midodrine or florinef but I would rather have him eat a normal diet and then add additional medications if still needed.

## 2014-03-27 DIAGNOSIS — I959 Hypotension, unspecified: Secondary | ICD-10-CM | POA: Diagnosis not present

## 2014-03-27 DIAGNOSIS — Z1331 Encounter for screening for depression: Secondary | ICD-10-CM | POA: Diagnosis not present

## 2014-03-27 DIAGNOSIS — F319 Bipolar disorder, unspecified: Secondary | ICD-10-CM | POA: Diagnosis not present

## 2014-03-27 DIAGNOSIS — I714 Abdominal aortic aneurysm, without rupture, unspecified: Secondary | ICD-10-CM | POA: Diagnosis not present

## 2014-03-27 DIAGNOSIS — N183 Chronic kidney disease, stage 3 unspecified: Secondary | ICD-10-CM | POA: Diagnosis not present

## 2014-03-28 DIAGNOSIS — I498 Other specified cardiac arrhythmias: Secondary | ICD-10-CM | POA: Diagnosis not present

## 2014-03-28 DIAGNOSIS — I723 Aneurysm of iliac artery: Secondary | ICD-10-CM | POA: Diagnosis not present

## 2014-03-28 DIAGNOSIS — I951 Orthostatic hypotension: Secondary | ICD-10-CM | POA: Diagnosis not present

## 2014-03-28 DIAGNOSIS — I714 Abdominal aortic aneurysm, without rupture, unspecified: Secondary | ICD-10-CM | POA: Diagnosis not present

## 2014-03-28 DIAGNOSIS — F319 Bipolar disorder, unspecified: Secondary | ICD-10-CM | POA: Diagnosis not present

## 2014-03-28 DIAGNOSIS — N183 Chronic kidney disease, stage 3 unspecified: Secondary | ICD-10-CM | POA: Diagnosis not present

## 2014-03-31 ENCOUNTER — Encounter: Payer: Self-pay | Admitting: Vascular Surgery

## 2014-04-01 ENCOUNTER — Encounter: Payer: Self-pay | Admitting: Vascular Surgery

## 2014-04-01 ENCOUNTER — Other Ambulatory Visit: Payer: Self-pay | Admitting: Family

## 2014-04-01 ENCOUNTER — Ambulatory Visit (INDEPENDENT_AMBULATORY_CARE_PROVIDER_SITE_OTHER): Payer: Medicare Other | Admitting: Vascular Surgery

## 2014-04-01 ENCOUNTER — Ambulatory Visit (HOSPITAL_COMMUNITY)
Admission: RE | Admit: 2014-04-01 | Discharge: 2014-04-01 | Disposition: A | Discharge status: Home / Self Care | Payer: Medicare Other | Source: Ambulatory Visit | Attending: Vascular Surgery | Admitting: Vascular Surgery

## 2014-04-01 VITALS — BP 95/56 | HR 63 | Resp 18 | Ht 72.0 in | Wt 219.3 lb

## 2014-04-01 DIAGNOSIS — Z87891 Personal history of nicotine dependence: Secondary | ICD-10-CM | POA: Diagnosis not present

## 2014-04-01 DIAGNOSIS — Z01810 Encounter for preprocedural cardiovascular examination: Secondary | ICD-10-CM

## 2014-04-01 DIAGNOSIS — I714 Abdominal aortic aneurysm, without rupture, unspecified: Secondary | ICD-10-CM

## 2014-04-01 NOTE — Progress Notes (Signed)
Patient name: Fernando Carr MRN: [id number] DOB: [birth year] Sex: male     Reason for referral:  Chief Complaint  Patient presents with  . Follow-up    here to discuss AAA  with Dr. Donnetta Hutching     . AAA    HISTORY OF PRESENT ILLNESS: Patient is here today for followup of recent hospitalization. He is seeing me since 2010 with a known infrarenal abdominal aortic aneurysm. He was admitted to the hospital approximately one month ago with abdominal and back discomfort. CT at that time showed expansion of the aneurysm with no evidence of leak. Did not feel that this was the cause of his back discomfort but was concerned regarding his size and rapid expansion. He had recovered from his hospitalization and was discharged home. He is seen today for further discussion. He has seen Dr.Nasher for preoperative cardiac clearance. His blood pressure has been running somewhat low and therefore has not been able to undergo nuclear scan. This is been rescheduled for tomorrow.  Past Medical History  Diagnosis Date  . High cholesterol   . Bipolar disorder   . AAA (abdominal aortic aneurysm)   . Depression   . Renal insufficiency     chronic renal   . Suicide attempt aug. 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 Knee arthroscopy Left 1972    History   Social History  . Marital Status: Single    Spouse Name: N/A    Number of Children: N/A  . Years of Education: N/A   Occupational History  . Not on file.   Social History Main Topics  . Smoking status: Former Smoker -- 1.00 packs/day for 40 years    Types: Cigarettes    Quit date: 03/11/2014  . Smokeless tobacco: Never Used  . Alcohol Use: No  . Drug Use: No  . Sexual Activity: No   Other Topics Concern  . Not on file   Social History Narrative  . No narrative on file    Family History  Problem Relation Age of Onset  . Alcohol abuse Mother   . Alcohol abuse  Father   . Suicidality Paternal Uncle   . Suicidality Cousin   . Depression Daughter     Allergies as of 04/01/2014  . (No Known Allergies)    Current Outpatient Prescriptions on File Prior to Visit  Medication Sig Dispense Refill  . escitalopram (LEXAPRO) 20 MG tablet Take 1 tablet (20 mg total) by mouth daily.  30 tablet  0  . fenofibrate micronized (LOFIBRA) 67 MG capsule Take 67 mg by mouth daily.       . risperiDONE (RISPERDAL) 1 MG tablet Take 1 tablet (1 mg total) by mouth at bedtime.  30 tablet  0  . simvastatin (ZOCOR) 20 MG tablet Take 10 mg by mouth daily.        No current facility-administered medications on file prior to visit.       PHYSICAL EXAMINATION:  General: The patient is a well-nourished male, in no acute distress. Vital signs are BP 95/56  Pulse 63  Resp 18  Ht 6' (1.829 m)  Wt 219 lb 4.8 oz (99.474 kg)  BMI 29.74 kg/m2 Pulmonary: There is a good air exchange  Abdomen: Soft and non-tender with normal pitch bowel sounds. I could not palpate any aneurysm. He does have a stasis rectus Musculoskeletal: There are no major  deformities.  There is no significant extremity pain. Neurologic: No focal weakness or paresthesias are detected, Skin: There are no ulcer or rashes noted. Psychiatric: The patient has normal affect. Cardiovascular: 2+ radial and femoral pulses. I was unable to palpate pedal pulses.   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 Have recommended open aneurysm repair following cardiac clearance. Discussed the magnitude of the  procedure and expected thoughts of hospitalization. Also explained potential complications with surgery. He does not have any family in the area does look home. Explained that he'll likely require a brief period skilled nursing facility after discharge for redilatation prior to discharge to home independently he understands and wishes to proceed assuming his cardiac clearance has been obtained    EARLY, TODD [facility] Office: [phone]

## 2014-04-02 ENCOUNTER — Ambulatory Visit (INDEPENDENT_AMBULATORY_CARE_PROVIDER_SITE_OTHER): Payer: Medicare Other | Admitting: *Deleted

## 2014-04-02 ENCOUNTER — Other Ambulatory Visit (HOSPITAL_COMMUNITY): Payer: Self-pay | Admitting: Psychiatry

## 2014-04-02 VITALS — BP 86/58 | HR 68 | Ht 72.0 in | Wt 219.0 lb

## 2014-04-02 DIAGNOSIS — I951 Orthostatic hypotension: Secondary | ICD-10-CM | POA: Diagnosis not present

## 2014-04-02 DIAGNOSIS — I714 Abdominal aortic aneurysm, without rupture, unspecified: Secondary | ICD-10-CM | POA: Diagnosis not present

## 2014-04-02 DIAGNOSIS — I723 Aneurysm of iliac artery: Secondary | ICD-10-CM | POA: Diagnosis not present

## 2014-04-02 DIAGNOSIS — F319 Bipolar disorder, unspecified: Secondary | ICD-10-CM | POA: Diagnosis not present

## 2014-04-02 DIAGNOSIS — N183 Chronic kidney disease, stage 3 unspecified: Secondary | ICD-10-CM | POA: Diagnosis not present

## 2014-04-02 DIAGNOSIS — I498 Other specified cardiac arrhythmias: Secondary | ICD-10-CM | POA: Diagnosis not present

## 2014-04-02 DIAGNOSIS — I959 Hypotension, unspecified: Secondary | ICD-10-CM

## 2014-04-02 NOTE — Patient Instructions (Signed)
Pt advised that I would forward this information to Dr Acie Fredrickson for review and recommendations.

## 2014-04-02 NOTE — Telephone Encounter (Signed)
Given new script with new dose on 03/25/14.

## 2014-04-02 NOTE — Progress Notes (Addendum)
1.) Reason for visit: BP check  2.) Name of MD requesting visit: Dr Acie Fredrickson         3.) H&P: Pt presents for preoperative evaluation prior to having abdominal aortic aneurysm repair. Unfortunately, his blood pressure is quite low density. He has not eaten well today. In fact he is only eating breakfast and has not had anything to eat since that time. His BP is too low to safely do a [employer] . He has several potential causes of his low blood pressure including poor by mouth intake and his Rispirdal therapy. I have placed a call to his psychiatrist to see if there is another antidepressant that we can use that will not cause hypotension.  We could try midodrine or florinef but I would rather have him eat a normal diet and then add additional medications if still needed.   4.)ROS related to problem:   Pt does not have any complaints.         Pt states he has been eating a more normal diet.  5.)Assessment and plan per MD: Will forward to Dr Acie Fredrickson for review and recommendations.     Cardiology Note:  We are scheduling a myoview as preop for his AAA surgery.   Thayer Headings, Brooke Bonito., MD, [facility] 04/08/2014, 3:00 PM [address]  [place] Pager [phone]

## 2014-04-08 ENCOUNTER — Encounter (HOSPITAL_COMMUNITY): Payer: Self-pay | Admitting: Psychiatry

## 2014-04-08 ENCOUNTER — Telehealth: Payer: Self-pay | Admitting: Nurse Practitioner

## 2014-04-08 ENCOUNTER — Ambulatory Visit (INDEPENDENT_AMBULATORY_CARE_PROVIDER_SITE_OTHER): Payer: Medicare Other | Admitting: Psychiatry

## 2014-04-08 VITALS — BP 97/63 | HR 79 | Ht 72.0 in | Wt 218.4 lb

## 2014-04-08 DIAGNOSIS — F319 Bipolar disorder, unspecified: Secondary | ICD-10-CM

## 2014-04-08 DIAGNOSIS — I714 Abdominal aortic aneurysm, without rupture, unspecified: Secondary | ICD-10-CM

## 2014-04-08 MED ORDER — RISPERIDONE 1 MG PO TABS: 1.0000 mg | ORAL_TABLET | Freq: Every day | ORAL | Status: DC

## 2014-04-08 MED ORDER — ESCITALOPRAM OXALATE 20 MG PO TABS: 20.0000 mg | ORAL_TABLET | Freq: Every day | ORAL | Status: DC

## 2014-04-08 NOTE — Telephone Encounter (Signed)
Called patient and advised that per Dr. Acie Fredrickson, patient needs to have a lexiscan myoview and that someone from our office will call him to schedule.  I advised patient of the pre-procedure instructions and that patient may continue medications without interruption.  Patient verbalized understanding and agreement.

## 2014-04-08 NOTE — Progress Notes (Signed)
[name]  Fernando Carr Fernando Carr [id number] 73 y.o.  04/08/2014 2:19 PM  Chief Complaint:  I am taking my medication as prescribed.             History of Present Illness: Fernando Carr came for his followup appointment.  He is taking Risperdal and Lexapro as prescribed.  Since he is taking his medication every day he is feeling less anxious and less depressed.  He saw a cardiologist and he supposed to get a stress test however because of low blood pressure he has not to schedule yet .  I talked to his primary care physician Dr. Deforest Hoyles because of low blood pressure .  His cardiologist and primary care physician is concerned about low blood pressure which they believed could be caused by Risperdal .  However patient has taken Risperdal in the past without any problem.  Today his blood pressure is better from the last visit.  He also feeling less anxious and less depressed.  He denies any suicidal thoughts.  He is sleeping better.  His appetite is improved.  He has more energy.  He is sad because of his daughter who moved to [place].  This daughter is pregnant and due in December .  Patient reported that his daughter may not come back to [place] and he is not able to see his grandchildren.  He is talking to his daughter on the phone .  Patient denies any hallucination , paranoia or any feeling of hopelessness.  He feels medicine is helping however I do believe patient requires counseling.  He is relieved because he is not going to extend her lease for his friend who is not paying the rent.  Patient must continue his current psychotropic medication.    Suicidal Ideation: No Plan Formed: No Patient has means to carry out plan: No  Homicidal Ideation: No Plan Formed: No Patient has means to carry out plan: No  Review of Systems: Psychiatric: Agitation: No Hallucination: No Depressed Mood: Yes Insomnia: No Hypersomnia: No Altered Concentration: No Feels Worthless:  No Grandiose Ideas: No Belief In Special Powers: No New/Increased Substance Abuse: No Compulsions: No  Neurologic: Headache: No Seizure: No Paresthesias: No  Medical History:  Patient has a history of hyperlipidemia, obesity and recently started seeing Dr. Deforest Hoyles at [facility] physician.    Outpatient Encounter Prescriptions as of 04/08/2014  Medication Sig  . escitalopram (LEXAPRO) 20 MG tablet Take 1 tablet (20 mg total) by mouth daily.  . fenofibrate micronized (LOFIBRA) 67 MG capsule Take 67 mg by mouth daily.   . risperiDONE (RISPERDAL) 1 MG tablet Take 1 tablet (1 mg total) by mouth at bedtime.  . simvastatin (ZOCOR) 20 MG tablet Take 10 mg by mouth daily.   . [DISCONTINUED] escitalopram (LEXAPRO) 20 MG tablet Take 1 tablet (20 mg total) by mouth daily.  . [DISCONTINUED] escitalopram (LEXAPRO) 20 MG tablet Take 1 tablet (20 mg total) by mouth daily.  . [DISCONTINUED] risperiDONE (RISPERDAL) 1 MG tablet Take 1 tablet (1 mg total) by mouth at bedtime.  . [DISCONTINUED] risperiDONE (RISPERDAL) 1 MG tablet Take 1 tablet (1 mg total) by mouth at bedtime.   Recent Results (from the past 2160 hour(s))  CBC WITH DIFFERENTIAL     Status: Abnormal   Collection Time    03/09/14  2:38 PM      Result Value Ref Range   WBC 7.5  4.0 - 10.5 K/uL   RBC 4.49  4.22 - 5.81  MIL/uL   Hemoglobin 13.3  13.0 - 17.0 g/dL   HCT 40.1  39.0 - 52.0 %   MCV 89.3  78.0 - 100.0 fL   MCH 29.6  26.0 - 34.0 pg   MCHC 33.2  30.0 - 36.0 g/dL   RDW 13.0  11.5 - 15.5 %   Platelets 150  150 - 400 K/uL   Neutrophils Relative % 79 (*) 43 - 77 %   Neutro Abs 5.8  1.7 - 7.7 K/uL   Lymphocytes Relative 15  12 - 46 %   Lymphs Abs 1.2  0.7 - 4.0 K/uL   Monocytes Relative 6  3 - 12 %   Monocytes Absolute 0.5  0.1 - 1.0 K/uL   Eosinophils Relative 0  0 - 5 %   Eosinophils Absolute 0.0  0.0 - 0.7 K/uL   Basophils Relative 0  0 - 1 %   Basophils Absolute 0.0  0.0 - 0.1 K/uL  COMPREHENSIVE METABOLIC PANEL     Status:  Abnormal   Collection Time    03/09/14  2:38 PM      Result Value Ref Range   Sodium 141  137 - 147 mEq/L   Potassium 4.1  3.7 - 5.3 mEq/L   Chloride 104  96 - 112 mEq/L   CO2 23  19 - 32 mEq/L   Glucose, Bld 111 (*) 70 - 99 mg/dL   BUN 24 (*) 6 - 23 mg/dL   Creatinine, Ser 1.75 (*) 0.50 - 1.35 mg/dL   Calcium 10.9 (*) 8.4 - 10.5 mg/dL   Total Protein 6.7  6.0 - 8.3 g/dL   Albumin 3.6  3.5 - 5.2 g/dL   AST 10  0 - 37 U/L   ALT 13  0 - 53 U/L   Alkaline Phosphatase 87  39 - 117 U/L   Total Bilirubin 0.3  0.3 - 1.2 mg/dL   GFR calc non Af Amer 37 (*) >90 mL/min   GFR calc Af Amer 43 (*) >90 mL/min   Comment: (NOTE)     The eGFR has been calculated using the CKD EPI equation.     This calculation has not been validated in all clinical situations.     eGFR's persistently <90 mL/min signify possible Chronic Kidney     Disease.   Anion gap 14  5 - 15  I-STAT TROPOININ, ED     Status: None   Collection Time    03/09/14  2:43 PM      Result Value Ref Range   Troponin i, poc 0.01  0.00 - 0.08 ng/mL   Comment 3            Comment: Due to the release kinetics of cTnI,     a negative result within the first hours     of the onset of symptoms does not rule out     myocardial infarction with certainty.     If myocardial infarction is still suspected,     repeat the test at appropriate intervals.  I-STAT CG4 LACTIC ACID, ED     Status: None   Collection Time    03/09/14  3:16 PM      Result Value Ref Range   Lactic Acid, Venous 2.17  0.5 - 2.2 mmol/L  URINALYSIS, ROUTINE W REFLEX MICROSCOPIC     Status: Abnormal   Collection Time    03/09/14  3:23 PM      Result Value Ref Range   Color,  Urine AMBER (*) YELLOW   Comment: BIOCHEMICALS MAY BE AFFECTED BY COLOR   APPearance CLOUDY (*) CLEAR   Specific Gravity, Urine 1.019  1.005 - 1.030   pH 7.5  5.0 - 8.0   Glucose, UA NEGATIVE  NEGATIVE mg/dL   Hgb urine dipstick NEGATIVE  NEGATIVE   Bilirubin Urine NEGATIVE  NEGATIVE   Ketones,  ur NEGATIVE  NEGATIVE mg/dL   Protein, ur 100 (*) NEGATIVE mg/dL   Urobilinogen, UA 2.0 (*) 0.0 - 1.0 mg/dL   Nitrite NEGATIVE  NEGATIVE   Leukocytes, UA NEGATIVE  NEGATIVE  URINE MICROSCOPIC-ADD ON     Status: None   Collection Time    03/09/14  3:23 PM      Result Value Ref Range   Squamous Epithelial / LPF RARE  RARE   WBC, UA 0-2  <3 WBC/hpf   Urine-Other AMORPHOUS URATES/PHOSPHATES    MRSA PCR SCREENING     Status: None   Collection Time    03/09/14  6:36 PM      Result Value Ref Range   MRSA by PCR NEGATIVE  NEGATIVE   Comment:            The GeneXpert MRSA Assay (FDA     approved for NASAL specimens     only), is one component of a     comprehensive MRSA colonization     surveillance program. It is not     intended to diagnose MRSA     infection nor to guide or     monitor treatment for     MRSA infections.  TSH     Status: Abnormal   Collection Time    03/10/14  2:55 AM      Result Value Ref Range   TSH 0.301 (*) 0.350 - 4.500 uIU/mL  COMPREHENSIVE METABOLIC PANEL     Status: Abnormal   Collection Time    03/10/14  2:55 AM      Result Value Ref Range   Sodium 145  137 - 147 mEq/L   Potassium 3.7  3.7 - 5.3 mEq/L   Chloride 109  96 - 112 mEq/L   CO2 24  19 - 32 mEq/L   Glucose, Bld 82  70 - 99 mg/dL   BUN 21  6 - 23 mg/dL   Creatinine, Ser 1.34  0.50 - 1.35 mg/dL   Calcium 10.3  8.4 - 10.5 mg/dL   Total Protein 6.0  6.0 - 8.3 g/dL   Albumin 3.3 (*) 3.5 - 5.2 g/dL   AST 10  0 - 37 U/L   ALT 11  0 - 53 U/L   Alkaline Phosphatase 82  39 - 117 U/L   Total Bilirubin 0.2 (*) 0.3 - 1.2 mg/dL   GFR calc non Af Amer 51 (*) >90 mL/min   GFR calc Af Amer 59 (*) >90 mL/min   Comment: (NOTE)     The eGFR has been calculated using the CKD EPI equation.     This calculation has not been validated in all clinical situations.     eGFR's persistently <90 mL/min signify possible Chronic Kidney     Disease.   Anion gap 12  5 - 15  PROTIME-INR     Status: None    Collection Time    03/10/14  2:55 AM      Result Value Ref Range   Prothrombin Time 13.7  11.6 - 15.2 seconds   INR 1.05  0.00 - 1.49  CBC     Status: Abnormal   Collection Time    03/10/14  2:55 AM      Result Value Ref Range   WBC 6.3  4.0 - 10.5 K/uL   RBC 4.17 (*) 4.22 - 5.81 MIL/uL   Hemoglobin 12.2 (*) 13.0 - 17.0 g/dL   HCT 37.3 (*) 39.0 - 52.0 %   MCV 89.4  78.0 - 100.0 fL   MCH 29.3  26.0 - 34.0 pg   MCHC 32.7  30.0 - 36.0 g/dL   RDW 13.3  11.5 - 15.5 %   Platelets 142 (*) 150 - 400 K/uL  T4, FREE     Status: None   Collection Time    03/11/14  3:38 AM      Result Value Ref Range   Free T4 1.31  0.80 - 1.80 ng/dL   Comment: Performed at [facility]     Status: Abnormal   Collection Time    03/11/14  3:38 AM      Result Value Ref Range   Sodium 143  137 - 147 mEq/L   Potassium 4.0  3.7 - 5.3 mEq/L   Chloride 108  96 - 112 mEq/L   CO2 25  19 - 32 mEq/L   Glucose, Bld 94  70 - 99 mg/dL   BUN 19  6 - 23 mg/dL   Creatinine, Ser 1.23  0.50 - 1.35 mg/dL   Calcium 9.8  8.4 - 10.5 mg/dL   GFR calc non Af Amer 56 (*) >90 mL/min   GFR calc Af Amer 65 (*) >90 mL/min   Comment: (NOTE)     The eGFR has been calculated using the CKD EPI equation.     This calculation has not been validated in all clinical situations.     eGFR's persistently <90 mL/min signify possible Chronic Kidney     Disease.   Anion gap 10  5 - 15  CBC     Status: Abnormal   Collection Time    03/11/14  3:38 AM      Result Value Ref Range   WBC 6.7  4.0 - 10.5 K/uL   RBC 4.22  4.22 - 5.81 MIL/uL   Hemoglobin 12.1 (*) 13.0 - 17.0 g/dL   HCT 37.6 (*) 39.0 - 52.0 %   MCV 89.1  78.0 - 100.0 fL   MCH 28.7  26.0 - 34.0 pg   MCHC 32.2  30.0 - 36.0 g/dL   RDW 13.2  11.5 - 15.5 %   Platelets 138 (*) 150 - 400 K/uL  MAGNESIUM     Status: None   Collection Time    03/11/14  3:38 AM      Result Value Ref Range   Magnesium 1.8  1.5 - 2.5 mg/dL  PHOSPHORUS     Status: None    Collection Time    03/11/14  3:38 AM      Result Value Ref Range   Phosphorus 2.8  2.3 - 4.6 mg/dL    Past Psychiatric History/Hospitalization(s): Patient has at least one psychiatric inpatient treatment in 2006 detail suicidal attempt. He injected Ethyline Glycol. 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  He suffered significant lost including his property and he was feeling hopeless helpless and having paranoid thoughts.  Recently they had tried Abilify but he developed tremors and worsening of the symptoms.  He had a good response with Risperdal in the past.  Anxiety: No Bipolar Disorder: No Depression: Yes Mania: No Psychosis: Yes Schizophrenia: No Personality Disorder: No Hospitalization for psychiatric illness: Yes History of Electroconvulsive Shock Therapy: No Prior Suicide Attempts: Yes  Physical Exam: Constitutional:  BP 97/63  Pulse 79  Ht 6' (1.829 m)  Wt 218 lb 6.4 oz (99.066 kg)  BMI 29.61 kg/m2  General Appearance: alert, oriented, no acute distress, well nourished, obese and Maintain fair eye contact  Review of Systems  Musculoskeletal:       Leg pain  Neurological: Negative.    Musculoskeletal: Strength & Muscle Tone: within normal limits Gait & Station: unsteady Patient leans: N/A  Mental status examination : Patient is casually dressed and fairly groomed.  He appears tired.  He is cooperative and maintained good eye contact.  His speech is slow , clear and coherent.  His thought process is slow but logical and goal-directed.  He denies any paranoia or any hallucination.  He denies any active or passive suicidal thoughts or homicidal thoughts.  There were no flight of ideas or any loose association.  He described his mood is depressed and sad and his affect is constricted.  There were no delusions or any paranoia .  There were no obsessive thoughts.  His psychomotor activity is slow.  His fund of knowledge is fair.  His  attention and concentration is fair.  He is alert and oriented x3.  His insight judgment and impulse control is okay.  Established Problem, Stable/Improving (1), Review of Psycho-Social Stressors (1), Review of Last Therapy Session (1) and Review of Medication Regimen & Side Effects (2)  Assessment: Axis I: Bipolar disorder type I   Axis II: Deferred  Axis III:  Patient Active Problem List   Diagnosis Date Noted  . AKI (acute kidney injury) 03/10/2014  . Dehydration 03/10/2014  . FTT (failure to thrive) in adult 03/10/2014  . 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 Sinus bradycardia 03/10/2014  . HTN (hypertension) 03/10/2014  . Hypotension, postural 03/09/2014  . CRD (chronic renal disease), stage III 01/09/2014  . Abdominal aneurysm without mention of rupture 12/20/2011  . Bipolar 1 disorder 10/24/2011  . Hyperlipemia 10/10/2011    Axis IV: Mild  Axis V: 60-65   Plan: Patient is improved from the last visit.  He strongly encouraged to keep taking his medication as prescribed.  Continue Lexapro 20 mg daily and continue Risperdal 1 mg at bedtime .  Recommended to see a therapist for counseling in this office.  He had seen Kristen in the past with good response.  Recommended to call us back if he has any question or any concern.  I will see him again in 6 weeks. Discuss safety plan that anytime having active suicidal thoughts or homicidal thoughts then patient need to call 911 or go to the local emergency room.  Merisa Julio T., MD 04/08/2014

## 2014-04-10 DIAGNOSIS — I714 Abdominal aortic aneurysm, without rupture, unspecified: Secondary | ICD-10-CM | POA: Diagnosis not present

## 2014-04-10 DIAGNOSIS — N183 Chronic kidney disease, stage 3 unspecified: Secondary | ICD-10-CM | POA: Diagnosis not present

## 2014-04-10 DIAGNOSIS — I498 Other specified cardiac arrhythmias: Secondary | ICD-10-CM | POA: Diagnosis not present

## 2014-04-10 DIAGNOSIS — I951 Orthostatic hypotension: Secondary | ICD-10-CM | POA: Diagnosis not present

## 2014-04-10 DIAGNOSIS — F319 Bipolar disorder, unspecified: Secondary | ICD-10-CM | POA: Diagnosis not present

## 2014-04-10 DIAGNOSIS — I723 Aneurysm of iliac artery: Secondary | ICD-10-CM | POA: Diagnosis not present

## 2014-04-19 ENCOUNTER — Emergency Department (HOSPITAL_COMMUNITY): Payer: Medicare Other

## 2014-04-19 ENCOUNTER — Emergency Department (HOSPITAL_COMMUNITY)
Admission: EM | Admit: 2014-04-19 | Discharge: 2014-04-19 | Disposition: A | Discharge status: Home / Self Care | Payer: Medicare Other | Attending: Emergency Medicine | Admitting: Emergency Medicine

## 2014-04-19 ENCOUNTER — Encounter (HOSPITAL_COMMUNITY): Payer: Self-pay | Admitting: Emergency Medicine

## 2014-04-19 DIAGNOSIS — E7800 Pure hypercholesterolemia, unspecified: Secondary | ICD-10-CM | POA: Diagnosis not present

## 2014-04-19 DIAGNOSIS — Z87891 Personal history of nicotine dependence: Secondary | ICD-10-CM | POA: Insufficient documentation

## 2014-04-19 DIAGNOSIS — Z79899 Other long term (current) drug therapy: Secondary | ICD-10-CM | POA: Insufficient documentation

## 2014-04-19 DIAGNOSIS — R404 Transient alteration of awareness: Secondary | ICD-10-CM | POA: Insufficient documentation

## 2014-04-19 DIAGNOSIS — R918 Other nonspecific abnormal finding of lung field: Secondary | ICD-10-CM | POA: Diagnosis not present

## 2014-04-19 DIAGNOSIS — E860 Dehydration: Secondary | ICD-10-CM

## 2014-04-19 DIAGNOSIS — N179 Acute kidney failure, unspecified: Secondary | ICD-10-CM | POA: Diagnosis not present

## 2014-04-19 DIAGNOSIS — I959 Hypotension, unspecified: Secondary | ICD-10-CM | POA: Diagnosis not present

## 2014-04-19 DIAGNOSIS — F319 Bipolar disorder, unspecified: Secondary | ICD-10-CM | POA: Diagnosis not present

## 2014-04-19 DIAGNOSIS — Z87448 Personal history of other diseases of urinary system: Secondary | ICD-10-CM | POA: Insufficient documentation

## 2014-04-19 DIAGNOSIS — R55 Syncope and collapse: Secondary | ICD-10-CM | POA: Diagnosis not present

## 2014-04-19 LAB — CBC WITH DIFFERENTIAL/PLATELET
Basophils Absolute: 0 10*3/uL (ref 0.0–0.1)
Basophils Relative: 0 % (ref 0–1)
Eosinophils Absolute: 0 10*3/uL (ref 0.0–0.7)
Eosinophils Relative: 0 % (ref 0–5)
HCT: 36.6 % — ABNORMAL LOW (ref 39.0–52.0)
Hemoglobin: 12.2 g/dL — ABNORMAL LOW (ref 13.0–17.0)
Lymphocytes Relative: 12 % (ref 12–46)
Lymphs Abs: 1.1 10*3/uL (ref 0.7–4.0)
MCH: 29.6 pg (ref 26.0–34.0)
MCHC: 33.3 g/dL (ref 30.0–36.0)
MCV: 88.8 fL (ref 78.0–100.0)
Monocytes Absolute: 0.9 10*3/uL (ref 0.1–1.0)
Monocytes Relative: 9 % (ref 3–12)
Neutro Abs: 7.5 10*3/uL (ref 1.7–7.7)
Neutrophils Relative %: 79 % — ABNORMAL HIGH (ref 43–77)
Platelets: 139 10*3/uL — ABNORMAL LOW (ref 150–400)
RBC: 4.12 MIL/uL — ABNORMAL LOW (ref 4.22–5.81)
RDW: 13.1 % (ref 11.5–15.5)
WBC: 9.5 10*3/uL (ref 4.0–10.5)

## 2014-04-19 LAB — URINALYSIS, ROUTINE W REFLEX MICROSCOPIC
Bilirubin Urine: NEGATIVE
Glucose, UA: NEGATIVE mg/dL
Hgb urine dipstick: NEGATIVE
Ketones, ur: NEGATIVE mg/dL
Leukocytes, UA: NEGATIVE
Nitrite: NEGATIVE
Protein, ur: 100 mg/dL — AB
Specific Gravity, Urine: 1.022 (ref 1.005–1.030)
Urobilinogen, UA: 1 mg/dL (ref 0.0–1.0)
pH: 6 (ref 5.0–8.0)

## 2014-04-19 LAB — I-STAT CHEM 8, ED
BUN: 36 mg/dL — ABNORMAL HIGH (ref 6–23)
Calcium, Ion: 1.43 mmol/L — ABNORMAL HIGH (ref 1.13–1.30)
Chloride: 107 mEq/L (ref 96–112)
Creatinine, Ser: 1.7 mg/dL — ABNORMAL HIGH (ref 0.50–1.35)
Glucose, Bld: 94 mg/dL (ref 70–99)
HCT: 38 % — ABNORMAL LOW (ref 39.0–52.0)
Hemoglobin: 12.9 g/dL — ABNORMAL LOW (ref 13.0–17.0)
Potassium: 4.3 mEq/L (ref 3.7–5.3)
Sodium: 141 mEq/L (ref 137–147)
TCO2: 26 mmol/L (ref 0–100)

## 2014-04-19 LAB — URINE MICROSCOPIC-ADD ON

## 2014-04-19 LAB — I-STAT CG4 LACTIC ACID, ED: Lactic Acid, Venous: 0.65 mmol/L (ref 0.5–2.2)

## 2014-04-19 MED ORDER — FLUDROCORTISONE ACETATE 0.1 MG PO TABS: 0.1000 mg | ORAL_TABLET | Freq: Every day | ORAL | Status: DC

## 2014-04-19 MED ORDER — SODIUM CHLORIDE 0.9 % IV BOLUS (SEPSIS)
500.0000 mL | Freq: Once | INTRAVENOUS | Status: AC
  Administered 2014-04-19: 500 mL via INTRAVENOUS

## 2014-04-19 NOTE — ED Notes (Signed)
Lab results given to Dr. Pickering. 

## 2014-04-19 NOTE — ED Notes (Signed)
Dr Pickering at bedside 

## 2014-04-19 NOTE — ED Notes (Signed)
Meal tray ordered; patient attempting to find ride home

## 2014-04-19 NOTE — Discharge Instructions (Signed)
Dehydration, Adult  Dehydration is when you lose more fluids from the body than you take in. Vital organs like the kidneys, brain, and heart cannot function without a proper amount of fluids and salt. Any loss of fluids from the body can cause dehydration.   CAUSES    Vomiting.   Diarrhea.   Excessive sweating.   Excessive urine output.   Fever.  SYMPTOMS   Mild dehydration   Thirst.   Dry lips.   Slightly dry mouth.  Moderate dehydration   Very dry mouth.   Sunken eyes.   Skin does not bounce back quickly when lightly pinched and released.   Dark urine and decreased urine production.   Decreased tear production.   Headache.  Severe dehydration   Very dry mouth.   Extreme thirst.   Rapid, weak pulse (more than 100 beats per minute at rest).   Cold hands and feet.   Not able to sweat in spite of heat and temperature.   Rapid breathing.   Blue lips.   Confusion and lethargy.   Difficulty being awakened.   Minimal urine production.   No tears.  DIAGNOSIS   Your caregiver will diagnose dehydration based on your symptoms and your exam. Blood and urine tests will help confirm the diagnosis. The diagnostic evaluation should also identify the cause of dehydration.  TREATMENT   Treatment of mild or moderate dehydration can often be done at home by increasing the amount of fluids that you drink. It is best to drink small amounts of fluid more often. Drinking too much at one time can make vomiting worse. Refer to the home care instructions below.  Severe dehydration needs to be treated at the hospital where you will probably be given intravenous (IV) fluids that contain water and electrolytes.  HOME CARE INSTRUCTIONS    Ask your caregiver about specific rehydration instructions.   Drink enough fluids to keep your urine clear or pale yellow.   Drink small amounts frequently if you have nausea and vomiting.   Eat as you normally do.   Avoid:   Foods or drinks high in sugar.   Carbonated  drinks.   Juice.   Extremely hot or cold fluids.   Drinks with caffeine.   Fatty, greasy foods.   Alcohol.   Tobacco.   Overeating.   Gelatin desserts.   Wash your hands well to avoid spreading bacteria and viruses.   Only take over-the-counter or prescription medicines for pain, discomfort, or fever as directed by your caregiver.   Ask your caregiver if you should continue all prescribed and over-the-counter medicines.   Keep all follow-up appointments with your caregiver.  SEEK MEDICAL CARE IF:   You have abdominal pain and it increases or stays in one area (localizes).   You have a rash, stiff neck, or severe headache.   You are irritable, sleepy, or difficult to awaken.   You are weak, dizzy, or extremely thirsty.  SEEK IMMEDIATE MEDICAL CARE IF:    You are unable to keep fluids down or you get worse despite treatment.   You have frequent episodes of vomiting or diarrhea.   You have blood or green matter (bile) in your vomit.   You have blood in your stool or your stool looks black and tarry.   You have not urinated in 6 to 8 hours, or you have only urinated a small amount of very dark urine.   You have a fever.   You faint.  MAKE   SURE YOU:    Understand these instructions.   Will watch your condition.   Will get help right away if you are not doing well or get worse.  Document Released: 07/18/2005 Document Revised: 10/10/2011 Document Reviewed: 03/07/2011  ExitCare Patient Information 2015 [employer]. This information is not intended to replace advice given to you by your health care provider. Make sure you discuss any questions you have with your health care provider.  Hypotension  As your heart beats, it forces blood through your arteries. This force is your blood pressure. If your blood pressure is too low for you to go about your normal activities or to support the organs of your body, you have hypotension. Hypotension is also referred to as low blood pressure. When your blood  pressure becomes too low, you may not get enough blood to your brain. As a result, you may feel weak, feel lightheaded, or develop a rapid heart rate. In a more severe case, you may faint.  CAUSES  Various conditions can cause hypotension. These include:   Blood loss.   Dehydration.   Heart or endocrine problems.   Pregnancy.   Severe infection.   Not having a well-balanced diet filled with needed nutrients.   Severe allergic reactions (anaphylaxis).  Some medicines, such as blood pressure medicine or water pills (diuretics), may lower your blood pressure below normal. Sometimes taking too much medicine or taking medicine not as directed can cause hypotension.  TREATMENT   Hospitalization is sometimes required for hypotension if fluid or blood replacement is needed, if time is needed for medicines to wear off, or if further monitoring is needed. Treatment might include changing your diet, changing your medicines (including medicines aimed at raising your blood pressure), and use of support stockings.  HOME CARE INSTRUCTIONS    Drink enough fluids to keep your urine clear or pale yellow.   Take your medicines as directed by your health care provider.   Get up slowly from reclining or sitting positions. This gives your blood pressure a chance to adjust.   Wear support stockings as directed by your health care provider.   Maintain a healthy diet by including nutritious food, such as fruits, vegetables, nuts, whole grains, and lean meats.  SEEK MEDICAL CARE IF:   You have vomiting or diarrhea.   You have a fever for more than 2-3 days.   You feel more thirsty than usual.   You feel weak and tired.  SEEK IMMEDIATE MEDICAL CARE IF:    You have chest pain or a fast or irregular heartbeat.   You have a loss of feeling in some part of your body, or you lose movement in your arms or legs.   You have trouble speaking.   You become sweaty or feel lightheaded.   You faint.  MAKE SURE YOU:    Understand  these instructions.   Will watch your condition.   Will get help right away if you are not doing well or get worse.  Document Released: 07/18/2005 Document Revised: 05/08/2013 Document Reviewed: 01/18/2013  [employer] Patient Information 2015 [employer]. This information is not intended to replace advice given to you by your health care provider. Make sure you discuss any questions you have with your health care provider.

## 2014-04-19 NOTE — ED Notes (Signed)
EMS - Pt was coming from home with c/o of syncopal episodes.  Pt was sitting in a chair receiving a hair cut from his neighbor when he had multiple episodes of synocope in the chair.  Pt did not fall from the chair, denies N/V/Diarrhea, denies chest pain.  CBG 136, 100/56 BP, 70 HR.

## 2014-04-19 NOTE — ED Notes (Signed)
[facility] at bedside; pt refused transport; reports he found a ride that is almost here

## 2014-04-19 NOTE — ED Notes (Signed)
[name] [phone] and cell [phone].  Call with a status update on patient.

## 2014-04-19 NOTE — ED Provider Notes (Signed)
CSN: [id number]     Arrival date & time 04/19/14  1312 History   First MD Initiated Contact with Patient 04/19/14 1407     Chief Complaint  Patient presents with  . Loss of Consciousness     (Consider location/radiation/quality/duration/timing/severity/associated sxs/prior Treatment) Patient is a 73 y.o. male presenting with syncope. The history is provided by the patient.  Loss of Consciousness Episode history:  Multiple Associated symptoms: no chest pain, no headaches, no nausea, no shortness of breath, no vomiting and no weakness    patient states he was getting his haircut when he had syncopal episode sitting in the seat. States he feels somewhat better now. He has a history of similar episodes. He has had some chronic hypotension. So it was thought to be due to lack of oral intake. Patient states he has not eaten much in the last couple days. No chest pain. No trouble breathing. No headache. No confusion. No difficulty breathing. No abdominal pain. He does have a history of AAA. He is unable to get a stress test for a pair because of his hypotension. He gets seen by cardiology. Past Medical History  Diagnosis Date  . High cholesterol   . Bipolar disorder   . AAA (abdominal aortic aneurysm)   . Depression   . Renal insufficiency     chronic renal   . Suicide attempt aug. 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 Knee arthroscopy Left 1972   Family History  Problem Relation Age of Onset  . Alcohol abuse Mother   . Alcohol abuse Father   . Suicidality Paternal Uncle   . Suicidality Cousin   . Depression Daughter    History  Substance Use Topics  . Smoking status: Former Smoker -- 1.00 packs/day for 40 years    Types: Cigarettes    Quit date: 03/11/2014  . Smokeless tobacco: Never Used  . Alcohol Use: No    Review of Systems  Constitutional: Negative for activity change and appetite change.  Eyes:  Negative for pain.  Respiratory: Negative for chest tightness and shortness of breath.   Cardiovascular: Positive for syncope. Negative for chest pain and leg swelling.  Gastrointestinal: Negative for nausea, vomiting, abdominal pain and diarrhea.  Genitourinary: Negative for flank pain.  Musculoskeletal: Negative for back pain and neck stiffness.  Skin: Negative for rash.  Neurological: Positive for syncope. Negative for weakness, numbness and headaches.  Psychiatric/Behavioral: Negative for behavioral problems.      Allergies  Review of patient's allergies indicates no known allergies.  Home Medications   Prior to Admission medications   Medication Sig Start Date End Date Taking? Authorizing Provider  escitalopram (LEXAPRO) 20 MG tablet Take 20 mg by mouth daily.   Yes Historical Provider, MD  fenofibrate micronized (LOFIBRA) 67 MG capsule Take 67 mg by mouth daily.  10/04/11  Yes Historical Provider, MD  risperiDONE (RISPERDAL) 1 MG tablet Take 1 mg by mouth at bedtime.   Yes Historical Provider, MD  simvastatin (ZOCOR) 20 MG tablet Take 10 mg by mouth daily.  05/23/11  Yes Historical Provider, MD  fludrocortisone (FLORINEF) 0.1 MG tablet Take 1 tablet (0.1 mg total) by mouth daily. 04/19/14   Jasper Riling. Zarek Relph, MD   BP 114/56  Pulse 71  Temp(Src) 97.9 F (36.6 C) (Oral)  Resp 21  Ht 6' (1.829 m)  Wt 219 lb (99.338 kg)  BMI 29.70 kg/m2  SpO2  94% Physical Exam  Constitutional: He is oriented to person, place, and time. He appears well-developed and well-nourished.  HENT:  Head: Normocephalic.  Eyes: Pupils are equal, round, and reactive to light.  Cardiovascular: Normal rate and regular rhythm.   Pulmonary/Chest: Effort normal and breath sounds normal.  Abdominal: Soft. Bowel sounds are normal.  Musculoskeletal: Normal range of motion.  Neurological: He is alert and oriented to person, place, and time.  Skin: Skin is warm.    ED Course  Procedures (including critical  care time) Labs Review Labs Reviewed  CBC WITH DIFFERENTIAL - Abnormal; Notable for the following:    RBC 4.12 (*)    Hemoglobin 12.2 (*)    HCT 36.6 (*)    Platelets 139 (*)    Neutrophils Relative % 79 (*)    All other components within normal limits  I-STAT CHEM 8, ED - Abnormal; Notable for the following:    BUN 36 (*)    Creatinine, Ser 1.70 (*)    Calcium, Ion 1.43 (*)    Hemoglobin 12.9 (*)    HCT 38.0 (*)    All other components within normal limits  URINALYSIS, ROUTINE W REFLEX MICROSCOPIC  I-STAT CHEM 8, ED  I-STAT CG4 LACTIC ACID, ED    Imaging Review Dg Chest 2 View  04/19/2014   CLINICAL DATA:  Syncopal episode ; history of postural hypotension, chronic renal insufficiency, and abdominal aortic aneurysm  EXAM: CHEST  2 VIEW  COMPARISON:  PA and lateral chest x-ray of June 12, 2013 and CT scan of chest of March 09, 2014.  FINDINGS: The lungs are mildly hyperinflated and clear. The heart and pulmonary vascularity are normal. The mediastinum is normal in width. There is no pleural effusion. There is mild tortuosity of the descending thoracic aorta. The bony thorax is unremarkable.  IMPRESSION: There is mild hyperinflation consistent with COPD. There is no acute cardiopulmonary abnormality.   Electronically Signed   By: David  Martinique   On: 04/19/2014 15:30     EKG Interpretation   Date/Time:  Saturday April 19 2014 13:14:05 EDT Ventricular Rate:  69 PR Interval:  188 QRS Duration: 97 QT Interval:  361 QTC Calculation: 387 R Axis:   -6 Text Interpretation:  Sinus rhythm Atrial premature complexes Anterior  infarct, old Confirmed by Clarkson Rosselli  MD, Henrick Mcgue [id number]) on 04/19/2014  3:41:52 PM      MDM   Final diagnoses:  AKI (acute kidney injury)  Dehydration  Hypotension, unspecified hypotension type    Patient with hypotension. Syncope. Blood pressures improved with IV fluids. Has a history of same. Discussed with cardiology. We'll start Midrin. Patient  has some renal insufficiency likely due to hypotension. Will need to follow. Will discharge home. should be able to see Dr. Acie Fredrickson later this week.    Jasper Riling. Alvino Chapel, MD 04/19/14 1546

## 2014-04-28 ENCOUNTER — Ambulatory Visit (HOSPITAL_COMMUNITY): Payer: Medicare Other | Attending: Cardiovascular Disease | Admitting: Radiology

## 2014-04-28 VITALS — BP 132/59 | Ht 72.0 in | Wt 216.0 lb

## 2014-04-28 DIAGNOSIS — I10 Essential (primary) hypertension: Secondary | ICD-10-CM | POA: Insufficient documentation

## 2014-04-28 DIAGNOSIS — R5383 Other fatigue: Principal | ICD-10-CM

## 2014-04-28 DIAGNOSIS — I491 Atrial premature depolarization: Secondary | ICD-10-CM | POA: Diagnosis not present

## 2014-04-28 DIAGNOSIS — I714 Abdominal aortic aneurysm, without rupture, unspecified: Secondary | ICD-10-CM | POA: Insufficient documentation

## 2014-04-28 DIAGNOSIS — Z01810 Encounter for preprocedural cardiovascular examination: Secondary | ICD-10-CM | POA: Insufficient documentation

## 2014-04-28 DIAGNOSIS — R5381 Other malaise: Secondary | ICD-10-CM | POA: Diagnosis not present

## 2014-04-28 DIAGNOSIS — R002 Palpitations: Secondary | ICD-10-CM | POA: Insufficient documentation

## 2014-04-28 DIAGNOSIS — E785 Hyperlipidemia, unspecified: Secondary | ICD-10-CM | POA: Insufficient documentation

## 2014-04-28 DIAGNOSIS — R42 Dizziness and giddiness: Secondary | ICD-10-CM | POA: Insufficient documentation

## 2014-04-28 MED ORDER — TECHNETIUM TC 99M SESTAMIBI GENERIC - CARDIOLITE
33.0000 | Freq: Once | INTRAVENOUS | Status: AC | PRN
  Administered 2014-04-28: 33 via INTRAVENOUS

## 2014-04-28 MED ORDER — REGADENOSON 0.4 MG/5ML IV SOLN
0.4000 mg | Freq: Once | INTRAVENOUS | Status: AC
  Administered 2014-04-28: 0.4 mg via INTRAVENOUS

## 2014-04-28 MED ORDER — TECHNETIUM TC 99M SESTAMIBI GENERIC - CARDIOLITE
11.0000 | Freq: Once | INTRAVENOUS | Status: AC | PRN
  Administered 2014-04-28: 11 via INTRAVENOUS

## 2014-04-28 NOTE — Progress Notes (Signed)
[facility] [address] [place], [place] [address] [phone]    Cardiology Nuclear Med Study  Fernando Carr is a 73 y.o. male     MRN : [id number]     DOB: [birth year]  Procedure Date: 04/28/2014  Nuclear Med Background Indication for Stress Test:  Evaluation for Ischemia, Patient seen in hospital on 03-2014 for Weakness, and Dizziness, Enzymes negative, and Pending Surgical Clearance for  AAA Repair (5.4 cm) by Curt Jews, MD History:  AAA Cardiac Risk Factors: Hypertension and Lipids  Symptoms:  Dizziness, Fatigue and Palpitations   Nuclear Pre-Procedure Caffeine/Decaff Intake:  None> 12 hrs NPO After: 5:00pm   Lungs:  clear O2 Sat: 96% on room air. IV 0.9% NS with Angio Cath:  22g  IV Site: L Wrist x 1, tolerated well IV Started by:  Irven Baltimore, RN  Chest Size (in):  46 Cup Size: n/a  Height: 6' (1.829 m)  Weight:  216 lb (97.977 kg)  BMI:  Body mass index is 29.29 kg/(m^2). Tech Comments:  N/A    Nuclear Med Study 1 or 2 day study: 1 day  Stress Test Type:  Lexiscan  Reading MD: N/A  Order Authorizing Provider:  Mertie Moores, MD  Resting Radionuclide: Technetium 62m Sestamibi  Resting Radionuclide Dose: 11.0 mCi   Stress Radionuclide:  Technetium 68m Sestamibi  Stress Radionuclide Dose: 33.0 mCi           Stress Protocol Rest HR: 57 Stress HR: 69  Rest BP: 132/59 Stress BP: 138/68  Exercise Time (min): n/a METS: n/a   Predicted Max HR: 147 bpm % Max HR: 46.94 bpm Rate Pressure Product: 9108   Dose of Adenosine (mg):  n/a Dose of Lexiscan: 0.4 mg  Dose of Atropine (mg): n/a Dose of Dobutamine: n/a mcg/kg/min (at max HR)  Stress Test Technologist: Perrin Maltese, EMT-P  Nuclear Technologist:  Earl Many, CNMT     Rest Procedure:  Myocardial perfusion imaging was performed at rest 45 minutes following the intravenous administration of Technetium 77m Sestamibi. Rest ECG: NSR with non-specific ST-T wave changes  Stress  Procedure:  The patient received IV Lexiscan 0.4 mg over 15-seconds.  Technetium 70m Sestamibi injected at 30-seconds. This patient had sob and dizziness with the Lexiscan injection. Quantitative spect images were obtained after a 45 minute delay. Stress ECG: No significant change from baseline ECG  QPS Raw Data Images:  There is interference from nuclear activity from structures below the diaphragm. This does not affect the ability to read the study. Stress Images:  There is mild apical thinning with normal uptake in other regions. Rest Images:  There is mild apical thinning with normal uptake in other regions. Subtraction (SDS):  No evidence of ischemia. Transient Ischemic Dilatation (Normal <1.22):  1.17 Lung/Heart Ratio (Normal <0.45):  0.27  Quantitative Gated Spect Images QGS EDV:  149 ml QGS ESV:  64 ml  Impression Exercise Capacity:  Lexiscan with no exercise. BP Response:  Normal blood pressure response. Clinical Symptoms:  No chest pain. ECG Impression:  No significant ST segment change suggestive of ischemia. Comparison with Prior Nuclear Study: No previous nuclear study performed  Overall Impression:  Low risk stress nuclear study.  No ischemia.  LV Ejection Fraction: 57%.  LV Wall Motion:  NL LV Function; NL Wall Motion  Darlin Coco MD

## 2014-05-07 ENCOUNTER — Other Ambulatory Visit (HOSPITAL_COMMUNITY): Payer: Self-pay

## 2014-05-07 ENCOUNTER — Encounter (HOSPITAL_COMMUNITY): Payer: Self-pay

## 2014-05-07 ENCOUNTER — Ambulatory Visit: Payer: Self-pay | Admitting: Family

## 2014-05-16 ENCOUNTER — Other Ambulatory Visit: Payer: Self-pay

## 2014-05-20 ENCOUNTER — Encounter (HOSPITAL_COMMUNITY): Payer: Self-pay | Admitting: Psychiatry

## 2014-05-20 ENCOUNTER — Ambulatory Visit (INDEPENDENT_AMBULATORY_CARE_PROVIDER_SITE_OTHER): Payer: Medicare Other | Admitting: Psychiatry

## 2014-05-20 VITALS — BP 96/63 | HR 71 | Ht 72.0 in | Wt 219.4 lb

## 2014-05-20 DIAGNOSIS — F319 Bipolar disorder, unspecified: Secondary | ICD-10-CM

## 2014-05-20 MED ORDER — RISPERIDONE 1 MG PO TABS
1.0000 mg | ORAL_TABLET | Freq: Every day | ORAL | Status: DC
Start: 2014-05-20 — End: 2014-08-27

## 2014-05-20 MED ORDER — ESCITALOPRAM OXALATE 20 MG PO TABS: 20.0000 mg | ORAL_TABLET | Freq: Every day | ORAL | Status: DC

## 2014-05-20 NOTE — Progress Notes (Signed)
[place] Progress Note  Fernando Carr [id number] 73 y.o.  05/20/2014 12:02 PM  Chief Complaint:   I am feeling better.  I'm less depressed and less irritable.              History of Present Illness: Fernando Carr came for his followup appointment.  He is taking Risperdal and Lexapro as prescribed.  Few weeks ago he was in the emergency room because he became unconscious and faint .  Patient do not recall what happened to him .  In the emergency room his BUN/creatinine was high.  He was dehydrated.  Patient admitted sometime he does not drink enough water intake food.  He admitted lately more tired and easily exhausted.  However his depression irritability anger and sleep is much improved from the past.  Patient is scheduled to see Dr. Milinda Hirschfeld next Thursday and he will discussed about his health issues.  Patient is also waiting for his abdominal surgery.  He has AAA aneurysm .  Today his blood pressure is low but he denies any chest pain, shortness of breath, heaviness or any dizziness.  He does not want to change his medication.  He gets sometimes sat because of his daughter who moved to [place].  His daughter is pregnant and due in December.  He is disappointed because his daughter may not come back to [place] and he is not able to see his grandchildren.  He is talking to his daughter on the phone .  Patient denies any hallucination , paranoia or any feeling of hopelessness.    Suicidal Ideation: No Plan Formed: No Patient has means to carry out plan: No  Homicidal Ideation: No Plan Formed: No Patient has means to carry out plan: No  Review of Systems: Psychiatric: Agitation: No Hallucination: No Depressed Mood: Yes Insomnia: No Hypersomnia: No Altered Concentration: No Feels Worthless: No Grandiose Ideas: No Belief In Special Powers: No New/Increased Substance Abuse: No Compulsions: No  Neurologic: Headache: No Seizure: No Paresthesias: No  Medical  History:  Patient has a history of hyperlipidemia, obesity and recently started seeing Dr. Deforest Hoyles at [facility] physician.    Outpatient Encounter Prescriptions as of 05/20/2014  Medication Sig  . escitalopram (LEXAPRO) 20 MG tablet Take 1 tablet (20 mg total) by mouth daily.  . fenofibrate micronized (LOFIBRA) 67 MG capsule Take 67 mg by mouth daily.   . fludrocortisone (FLORINEF) 0.1 MG tablet Take 1 tablet (0.1 mg total) by mouth daily.  . risperiDONE (RISPERDAL) 1 MG tablet Take 1 tablet (1 mg total) by mouth at bedtime.  . simvastatin (ZOCOR) 20 MG tablet Take 10 mg by mouth daily.   . [DISCONTINUED] escitalopram (LEXAPRO) 20 MG tablet Take 20 mg by mouth daily.  . [DISCONTINUED] fenofibrate 54 MG tablet   . [DISCONTINUED] risperiDONE (RISPERDAL) 1 MG tablet Take 1 mg by mouth at bedtime.   Recent Results (from the past 2160 hour(s))  CBC WITH DIFFERENTIAL     Status: Abnormal   Collection Time    03/09/14  2:38 PM      Result Value Ref Range   WBC 7.5  4.0 - 10.5 K/uL   RBC 4.49  4.22 - 5.81 MIL/uL   Hemoglobin 13.3  13.0 - 17.0 g/dL   HCT 40.1  39.0 - 52.0 %   MCV 89.3  78.0 - 100.0 fL   MCH 29.6  26.0 - 34.0 pg   MCHC 33.2  30.0 - 36.0 g/dL   RDW 13.0  11.5 - 15.5 %   Platelets 150  150 - 400 K/uL   Neutrophils Relative % 79 (*) 43 - 77 %   Neutro Abs 5.8  1.7 - 7.7 K/uL   Lymphocytes Relative 15  12 - 46 %   Lymphs Abs 1.2  0.7 - 4.0 K/uL   Monocytes Relative 6  3 - 12 %   Monocytes Absolute 0.5  0.1 - 1.0 K/uL   Eosinophils Relative 0  0 - 5 %   Eosinophils Absolute 0.0  0.0 - 0.7 K/uL   Basophils Relative 0  0 - 1 %   Basophils Absolute 0.0  0.0 - 0.1 K/uL  COMPREHENSIVE METABOLIC PANEL     Status: Abnormal   Collection Time    03/09/14  2:38 PM      Result Value Ref Range   Sodium 141  137 - 147 mEq/L   Potassium 4.1  3.7 - 5.3 mEq/L   Chloride 104  96 - 112 mEq/L   CO2 23  19 - 32 mEq/L   Glucose, Bld 111 (*) 70 - 99 mg/dL   BUN 24 (*) 6 - 23 mg/dL    Creatinine, Ser 1.41 (*) 0.50 - 1.35 mg/dL   Calcium 03.0 (*) 8.4 - 10.5 mg/dL   Total Protein 6.7  6.0 - 8.3 g/dL   Albumin 3.6  3.5 - 5.2 g/dL   AST 10  0 - 37 U/L   ALT 13  0 - 53 U/L   Alkaline Phosphatase 87  39 - 117 U/L   Total Bilirubin 0.3  0.3 - 1.2 mg/dL   GFR calc non Af Amer 37 (*) >90 mL/min   GFR calc Af Amer 43 (*) >90 mL/min   Comment: (NOTE)     The eGFR has been calculated using the CKD EPI equation.     This calculation has not been validated in all clinical situations.     eGFR's persistently <90 mL/min signify possible Chronic Kidney     Disease.   Anion gap 14  5 - 15  I-STAT TROPOININ, ED     Status: None   Collection Time    03/09/14  2:43 PM      Result Value Ref Range   Troponin i, poc 0.01  0.00 - 0.08 ng/mL   Comment 3            Comment: Due to the release kinetics of cTnI,     a negative result within the first hours     of the onset of symptoms does not rule out     myocardial infarction with certainty.     If myocardial infarction is still suspected,     repeat the test at appropriate intervals.  I-STAT CG4 LACTIC ACID, ED     Status: None   Collection Time    03/09/14  3:16 PM      Result Value Ref Range   Lactic Acid, Venous 2.17  0.5 - 2.2 mmol/L  URINALYSIS, ROUTINE W REFLEX MICROSCOPIC     Status: Abnormal   Collection Time    03/09/14  3:23 PM      Result Value Ref Range   Color, Urine AMBER (*) YELLOW   Comment: BIOCHEMICALS MAY BE AFFECTED BY COLOR   APPearance CLOUDY (*) CLEAR   Specific Gravity, Urine 1.019  1.005 - 1.030   pH 7.5  5.0 - 8.0   Glucose, UA NEGATIVE  NEGATIVE mg/dL   Hgb urine dipstick NEGATIVE  NEGATIVE   Bilirubin Urine NEGATIVE  NEGATIVE   Ketones, ur NEGATIVE  NEGATIVE mg/dL   Protein, ur 100 (*) NEGATIVE mg/dL   Urobilinogen, UA 2.0 (*) 0.0 - 1.0 mg/dL   Nitrite NEGATIVE  NEGATIVE   Leukocytes, UA NEGATIVE  NEGATIVE  URINE MICROSCOPIC-ADD ON     Status: None   Collection Time    03/09/14  3:23 PM       Result Value Ref Range   Squamous Epithelial / LPF RARE  RARE   WBC, UA 0-2  <3 WBC/hpf   Urine-Other AMORPHOUS URATES/PHOSPHATES    MRSA PCR SCREENING     Status: None   Collection Time    03/09/14  6:36 PM      Result Value Ref Range   MRSA by PCR NEGATIVE  NEGATIVE   Comment:            The GeneXpert MRSA Assay (FDA     approved for NASAL specimens     only), is one component of a     comprehensive MRSA colonization     surveillance program. It is not     intended to diagnose MRSA     infection nor to guide or     monitor treatment for     MRSA infections.  TSH     Status: Abnormal   Collection Time    03/10/14  2:55 AM      Result Value Ref Range   TSH 0.301 (*) 0.350 - 4.500 uIU/mL  COMPREHENSIVE METABOLIC PANEL     Status: Abnormal   Collection Time    03/10/14  2:55 AM      Result Value Ref Range   Sodium 145  137 - 147 mEq/L   Potassium 3.7  3.7 - 5.3 mEq/L   Chloride 109  96 - 112 mEq/L   CO2 24  19 - 32 mEq/L   Glucose, Bld 82  70 - 99 mg/dL   BUN 21  6 - 23 mg/dL   Creatinine, Ser 1.34  0.50 - 1.35 mg/dL   Calcium 10.3  8.4 - 10.5 mg/dL   Total Protein 6.0  6.0 - 8.3 g/dL   Albumin 3.3 (*) 3.5 - 5.2 g/dL   AST 10  0 - 37 U/L   ALT 11  0 - 53 U/L   Alkaline Phosphatase 82  39 - 117 U/L   Total Bilirubin 0.2 (*) 0.3 - 1.2 mg/dL   GFR calc non Af Amer 51 (*) >90 mL/min   GFR calc Af Amer 59 (*) >90 mL/min   Comment: (NOTE)     The eGFR has been calculated using the CKD EPI equation.     This calculation has not been validated in all clinical situations.     eGFR's persistently <90 mL/min signify possible Chronic Kidney     Disease.   Anion gap 12  5 - 15  PROTIME-INR     Status: None   Collection Time    03/10/14  2:55 AM      Result Value Ref Range   Prothrombin Time 13.7  11.6 - 15.2 seconds   INR 1.05  0.00 - 1.49  CBC     Status: Abnormal   Collection Time    03/10/14  2:55 AM      Result Value Ref Range   WBC 6.3  4.0 - 10.5 K/uL   RBC 4.17  (*) 4.22 - 5.81 MIL/uL   Hemoglobin 12.2 (*) 13.0 -  17.0 g/dL   HCT 37.3 (*) 39.0 - 52.0 %   MCV 89.4  78.0 - 100.0 fL   MCH 29.3  26.0 - 34.0 pg   MCHC 32.7  30.0 - 36.0 g/dL   RDW 13.3  11.5 - 15.5 %   Platelets 142 (*) 150 - 400 K/uL  T4, FREE     Status: None   Collection Time    03/11/14  3:38 AM      Result Value Ref Range   Free T4 1.31  0.80 - 1.80 ng/dL   Comment: Performed at [facility]     Status: Abnormal   Collection Time    03/11/14  3:38 AM      Result Value Ref Range   Sodium 143  137 - 147 mEq/L   Potassium 4.0  3.7 - 5.3 mEq/L   Chloride 108  96 - 112 mEq/L   CO2 25  19 - 32 mEq/L   Glucose, Bld 94  70 - 99 mg/dL   BUN 19  6 - 23 mg/dL   Creatinine, Ser 1.23  0.50 - 1.35 mg/dL   Calcium 9.8  8.4 - 10.5 mg/dL   GFR calc non Af Amer 56 (*) >90 mL/min   GFR calc Af Amer 65 (*) >90 mL/min   Comment: (NOTE)     The eGFR has been calculated using the CKD EPI equation.     This calculation has not been validated in all clinical situations.     eGFR's persistently <90 mL/min signify possible Chronic Kidney     Disease.   Anion gap 10  5 - 15  CBC     Status: Abnormal   Collection Time    03/11/14  3:38 AM      Result Value Ref Range   WBC 6.7  4.0 - 10.5 K/uL   RBC 4.22  4.22 - 5.81 MIL/uL   Hemoglobin 12.1 (*) 13.0 - 17.0 g/dL   HCT 37.6 (*) 39.0 - 52.0 %   MCV 89.1  78.0 - 100.0 fL   MCH 28.7  26.0 - 34.0 pg   MCHC 32.2  30.0 - 36.0 g/dL   RDW 13.2  11.5 - 15.5 %   Platelets 138 (*) 150 - 400 K/uL  MAGNESIUM     Status: None   Collection Time    03/11/14  3:38 AM      Result Value Ref Range   Magnesium 1.8  1.5 - 2.5 mg/dL  PHOSPHORUS     Status: None   Collection Time    03/11/14  3:38 AM      Result Value Ref Range   Phosphorus 2.8  2.3 - 4.6 mg/dL  CBC WITH DIFFERENTIAL     Status: Abnormal   Collection Time    04/19/14  2:27 PM      Result Value Ref Range   WBC 9.5  4.0 - 10.5 K/uL   RBC 4.12 (*) 4.22 -  5.81 MIL/uL   Hemoglobin 12.2 (*) 13.0 - 17.0 g/dL   HCT 36.6 (*) 39.0 - 52.0 %   MCV 88.8  78.0 - 100.0 fL   MCH 29.6  26.0 - 34.0 pg   MCHC 33.3  30.0 - 36.0 g/dL   RDW 13.1  11.5 - 15.5 %   Platelets 139 (*) 150 - 400 K/uL   Neutrophils Relative % 79 (*) 43 - 77 %   Neutro Abs 7.5  1.7 -  7.7 K/uL   Lymphocytes Relative 12  12 - 46 %   Lymphs Abs 1.1  0.7 - 4.0 K/uL   Monocytes Relative 9  3 - 12 %   Monocytes Absolute 0.9  0.1 - 1.0 K/uL   Eosinophils Relative 0  0 - 5 %   Eosinophils Absolute 0.0  0.0 - 0.7 K/uL   Basophils Relative 0  0 - 1 %   Basophils Absolute 0.0  0.0 - 0.1 K/uL  I-STAT CHEM 8, ED     Status: Abnormal   Collection Time    04/19/14  2:42 PM      Result Value Ref Range   Sodium 141  137 - 147 mEq/L   Potassium 4.3  3.7 - 5.3 mEq/L   Chloride 107  96 - 112 mEq/L   BUN 36 (*) 6 - 23 mg/dL   Creatinine, Ser 1.70 (*) 0.50 - 1.35 mg/dL   Glucose, Bld 94  70 - 99 mg/dL   Calcium, Ion 1.43 (*) 1.13 - 1.30 mmol/L   TCO2 26  0 - 100 mmol/L   Hemoglobin 12.9 (*) 13.0 - 17.0 g/dL   HCT 38.0 (*) 39.0 - 52.0 %  I-STAT CG4 LACTIC ACID, ED     Status: None   Collection Time    04/19/14  2:43 PM      Result Value Ref Range   Lactic Acid, Venous 0.65  0.5 - 2.2 mmol/L  URINALYSIS, ROUTINE W REFLEX MICROSCOPIC     Status: Abnormal   Collection Time    04/19/14  3:11 PM      Result Value Ref Range   Color, Urine AMBER (*) YELLOW   Comment: BIOCHEMICALS MAY BE AFFECTED BY COLOR   APPearance CLOUDY (*) CLEAR   Specific Gravity, Urine 1.022  1.005 - 1.030   pH 6.0  5.0 - 8.0   Glucose, UA NEGATIVE  NEGATIVE mg/dL   Hgb urine dipstick NEGATIVE  NEGATIVE   Bilirubin Urine NEGATIVE  NEGATIVE   Ketones, ur NEGATIVE  NEGATIVE mg/dL   Protein, ur 100 (*) NEGATIVE mg/dL   Urobilinogen, UA 1.0  0.0 - 1.0 mg/dL   Nitrite NEGATIVE  NEGATIVE   Leukocytes, UA NEGATIVE  NEGATIVE  URINE MICROSCOPIC-ADD ON     Status: Abnormal   Collection Time    04/19/14  3:11 PM       Result Value Ref Range   Squamous Epithelial / LPF RARE  RARE   WBC, UA 0-2  <3 WBC/hpf   Bacteria, UA RARE  RARE   Casts HYALINE CASTS (*) NEGATIVE    Past Psychiatric History/Hospitalization(s): Patient has at least one psychiatric inpatient treatment in 2006 detail suicidal attempt. He injected Ethyline Glycol. 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  He suffered significant lost including his property and he was feeling hopeless helpless and having paranoid thoughts.  Recently they had tried Abilify but he developed tremors and worsening of the symptoms.  He had a good response with Risperdal in the past.    Anxiety: No Bipolar Disorder: No Depression: Yes Mania: No Psychosis: Yes Schizophrenia: No Personality Disorder: No Hospitalization for psychiatric illness: Yes History of Electroconvulsive Shock Therapy: No Prior Suicide Attempts: Yes  Physical Exam: Constitutional:  BP 96/63  Pulse 71  Ht 6' (1.829 m)  Wt 219 lb 6.4 oz (99.519 kg)  BMI 29.75 kg/m2  General Appearance: alert, oriented, no acute distress, well nourished, obese and Maintain fair eye contact  Review of Systems  Musculoskeletal:       Leg pain  Neurological: Negative.    Musculoskeletal: Strength & Muscle Tone: within normal limits Gait & Station: unsteady Patient leans: N/A  Mental status examination : Patient is casually dressed and fairly groomed.  He appears tired.  He is cooperative and maintained good eye contact.  His speech is slow , clear and coherent.  His thought process is slow but logical and goal-directed.  He denies any paranoia or any hallucination.  He denies any active or passive suicidal thoughts or homicidal thoughts.  There were no flight of ideas or any loose association.  He described his mood is tired and his affect is constricted.  There were no delusions or any paranoia .  There were no obsessive thoughts.  His psychomotor activity is slow.  His fund  of knowledge is fair.  His attention and concentration is fair.  He is alert and oriented x3.  His insight judgment and impulse control is okay.  Established Problem, Stable/Improving (1), New problem, with additional work up planned, Review of Psycho-Social Stressors (1), Review or order clinical lab tests (1), Discuss test with performing physician (1), Review and summation of old records (2), Review of Last Therapy Session (1) and Review of Medication Regimen & Side Effects (2)  Assessment: Axis I: Bipolar disorder type I   Axis II: Deferred  Axis III:  Patient Active Problem List   Diagnosis Date Noted  . AKI (acute kidney injury) 03/10/2014  . Dehydration 03/10/2014  . FTT (failure to thrive) in adult 03/10/2014  . 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 Sinus bradycardia 03/10/2014  . HTN (hypertension) 03/10/2014  . Hypotension, postural 03/09/2014  . CRD (chronic renal disease), stage III 01/09/2014  . Abdominal aneurysm without mention of rupture 12/20/2011  . Bipolar 1 disorder 10/24/2011  . Hyperlipemia 10/10/2011    Axis IV: Mild  Axis V: 60-65   Plan: Patient depression and mood has been improved from the past however physically he remains very tired .  I reviewed the notes from the emergency room along with blood work results.  He is scheduled to see his primary care physician next Thursday.  I strongly encouraged him to discuss with his primary care physician about his recent visit to the emergency room.  He has a high BUN and creatinine .  His hemoglobin and hematocrit was also low.  Encouraged to drink enough water .  At this time he does not want to change his medication.  I will continue Lexapro 20 mg daily and continue Risperdal 1 mg at bedtime .  Patient is not interested to see a counselor at this time.  Recommended to call us back if he has any question or any concern.  I will see him again in 8 weeks. Discuss safety plan that anytime having active suicidal  thoughts or homicidal thoughts then patient need to call 911 or go to the local emergency room.  Glorianna Gott T., MD 05/20/2014

## 2014-05-21 ENCOUNTER — Telehealth: Payer: Self-pay | Admitting: Vascular Surgery

## 2014-05-21 NOTE — Telephone Encounter (Addendum)
Message copied by Doristine Section on Wed May 21, 2014 12:39 PM ------      Message from: Michaele Offer S      Created: Wed May 21, 2014 12:28 PM      Regarding: appt. with [facility]       Please schedule pt. appt. with Dr. Donnetta Hutching to discuss open AAA surgery.  Recently had Nuclear Stress Test (9/28).  Needs to get written cardiac clearance from Dr. Acie Fredrickson. ------  notified patient of appt. with dr. early on 06-17-14 11:45

## 2014-05-22 DIAGNOSIS — E782 Mixed hyperlipidemia: Secondary | ICD-10-CM | POA: Diagnosis not present

## 2014-05-22 DIAGNOSIS — N183 Chronic kidney disease, stage 3 (moderate): Secondary | ICD-10-CM | POA: Diagnosis not present

## 2014-05-22 DIAGNOSIS — Z23 Encounter for immunization: Secondary | ICD-10-CM | POA: Diagnosis not present

## 2014-05-22 DIAGNOSIS — I714 Abdominal aortic aneurysm, without rupture: Secondary | ICD-10-CM | POA: Diagnosis not present

## 2014-05-22 DIAGNOSIS — F3170 Bipolar disorder, currently in remission, most recent episode unspecified: Secondary | ICD-10-CM | POA: Diagnosis not present

## 2014-05-26 ENCOUNTER — Telehealth: Payer: Self-pay | Admitting: Cardiovascular Disease

## 2014-05-26 NOTE — Telephone Encounter (Signed)
Received request from Nurse fax box, documents faxed for surgical clearance. To: [facility] Fax number: [phone] Attention: 10.26.15/km

## 2014-06-16 ENCOUNTER — Encounter: Payer: Self-pay | Admitting: Vascular Surgery

## 2014-06-17 ENCOUNTER — Ambulatory Visit (INDEPENDENT_AMBULATORY_CARE_PROVIDER_SITE_OTHER): Payer: Medicare Other | Admitting: Vascular Surgery

## 2014-06-17 ENCOUNTER — Encounter: Payer: Self-pay | Admitting: Vascular Surgery

## 2014-06-17 VITALS — BP 101/56 | HR 62 | Ht 72.0 in | Wt 217.0 lb

## 2014-06-17 DIAGNOSIS — I714 Abdominal aortic aneurysm, without rupture, unspecified: Secondary | ICD-10-CM

## 2014-06-17 NOTE — Progress Notes (Signed)
Here today for continued discussion regarding open aneurysm repair. He has undergone noninvasive cardiac nucleotide study in this shows a low risk from a cardiac standpoint.  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 Discussed that 1-2 days in the intensive care approximately hospitalization. He does not have any family support in the area and is concerned regarding this. I explained that the nurse case management would be involved and that if necessary he would go to an intermediate skilled nursing facility prior to discharge to home.  Past Medical History  Diagnosis Date  . High cholesterol   . Bipolar disorder   . AAA (abdominal aortic aneurysm)   . Depression   . Renal insufficiency     chronic renal   . Suicide attempt aug. 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 Smoking status: Former Smoker -- 1.00 packs/day for 40 years    Types: Cigarettes    Quit date: 03/11/2014  . Smokeless tobacco: Never Used  . Alcohol Use: No    Family History  Problem Relation Age of Onset  . Alcohol abuse Mother   . Alcohol abuse Father   . Suicidality Paternal Uncle   . Suicidality Cousin   . Depression Daughter     No Known Allergies  Current outpatient prescriptions: escitalopram (LEXAPRO) 20 MG tablet, Take 1 tablet (20 mg total) by mouth daily., Disp: 30 tablet, Rfl: 1;  fenofibrate micronized (LOFIBRA) 67 MG capsule, Take 67 mg by mouth daily. , Disp: , Rfl: ;  fludrocortisone (FLORINEF) 0.1 MG tablet, Take 1 tablet (0.1 mg total) by mouth daily., Disp: 14 tablet, Rfl: 0 risperiDONE (RISPERDAL) 1 MG tablet, Take 1 tablet (1 mg total) by mouth at bedtime., Disp: 30 tablet, Rfl: 1;  simvastatin (ZOCOR) 20 MG tablet, Take 10 mg by mouth daily. , Disp: , Rfl:   BP 101/56 mmHg  Pulse 62  Ht 6' (1.829 m)  Wt 217 lb (98.431 kg)  BMI 29.42 kg/m2  SpO2 100%  Body mass index is 29.42  kg/(m^2).       Physical exam his abdomen soft nontender no masses noted. He does have prominent aortic pulsation. 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 I did discuss the procedure. Recommend surgery and his elective schedule this for 07/07/2014

## 2014-06-18 ENCOUNTER — Other Ambulatory Visit: Payer: Self-pay

## 2014-06-25 ENCOUNTER — Encounter (HOSPITAL_COMMUNITY)
Admission: RE | Admit: 2014-06-25 | Discharge: 2014-06-25 | Disposition: A | Discharge status: Home / Self Care | Payer: Medicare Other | Source: Ambulatory Visit | Attending: Vascular Surgery | Admitting: Vascular Surgery

## 2014-06-25 ENCOUNTER — Encounter (HOSPITAL_COMMUNITY): Payer: Self-pay

## 2014-06-25 DIAGNOSIS — Z01812 Encounter for preprocedural laboratory examination: Secondary | ICD-10-CM | POA: Insufficient documentation

## 2014-06-25 LAB — URINALYSIS, ROUTINE W REFLEX MICROSCOPIC
Glucose, UA: NEGATIVE mg/dL
Hgb urine dipstick: NEGATIVE
Ketones, ur: 15 mg/dL — AB
Leukocytes, UA: NEGATIVE
Nitrite: NEGATIVE
Protein, ur: 30 mg/dL — AB
Specific Gravity, Urine: 1.026 (ref 1.005–1.030)
Urobilinogen, UA: 1 mg/dL (ref 0.0–1.0)
pH: 5.5 (ref 5.0–8.0)

## 2014-06-25 LAB — BLOOD GAS, ARTERIAL
Acid-base deficit: 0.9 mmol/L (ref 0.0–2.0)
Bicarbonate: 23.3 mEq/L (ref 20.0–24.0)
Drawn by: 42180
FIO2: 0.21 %
O2 Saturation: 95.2 %
Patient temperature: 98.6
TCO2: 24.5 mmol/L (ref 0–100)
pCO2 arterial: 38.9 mmHg (ref 35.0–45.0)
pH, Arterial: 7.394 (ref 7.350–7.450)
pO2, Arterial: 71.6 mmHg — ABNORMAL LOW (ref 80.0–100.0)

## 2014-06-25 LAB — CBC
HCT: 39.3 % (ref 39.0–52.0)
Hemoglobin: 13 g/dL (ref 13.0–17.0)
MCH: 29.5 pg (ref 26.0–34.0)
MCHC: 33.1 g/dL (ref 30.0–36.0)
MCV: 89.3 fL (ref 78.0–100.0)
Platelets: 142 10*3/uL — ABNORMAL LOW (ref 150–400)
RBC: 4.4 MIL/uL (ref 4.22–5.81)
RDW: 12.9 % (ref 11.5–15.5)
WBC: 7.3 10*3/uL (ref 4.0–10.5)

## 2014-06-25 LAB — URINE MICROSCOPIC-ADD ON

## 2014-06-25 LAB — APTT: aPTT: 34 seconds (ref 24–37)

## 2014-06-25 LAB — COMPREHENSIVE METABOLIC PANEL
ALT: 9 U/L (ref 0–53)
AST: 10 U/L (ref 0–37)
Albumin: 3.6 g/dL (ref 3.5–5.2)
Alkaline Phosphatase: 94 U/L (ref 39–117)
Anion gap: 13 (ref 5–15)
BUN: 20 mg/dL (ref 6–23)
CO2: 20 mEq/L (ref 19–32)
Calcium: 10.8 mg/dL — ABNORMAL HIGH (ref 8.4–10.5)
Chloride: 105 mEq/L (ref 96–112)
Creatinine, Ser: 1.17 mg/dL (ref 0.50–1.35)
GFR calc Af Amer: 70 mL/min — ABNORMAL LOW (ref >90)
GFR calc non Af Amer: 60 mL/min — ABNORMAL LOW (ref >90)
Glucose, Bld: 97 mg/dL (ref 70–99)
Potassium: 4.2 mEq/L (ref 3.7–5.3)
Sodium: 138 mEq/L (ref 137–147)
Total Bilirubin: <0.2 mg/dL — ABNORMAL LOW (ref 0.3–1.2)
Total Protein: 6.9 g/dL (ref 6.0–8.3)

## 2014-06-25 LAB — TYPE AND SCREEN
ABO/RH(D): O POS
Antibody Screen: NEGATIVE

## 2014-06-25 LAB — PROTIME-INR
INR: 1.07 (ref 0.00–1.49)
Prothrombin Time: 14.1 seconds (ref 11.6–15.2)

## 2014-06-25 LAB — ABO/RH: ABO/RH(D): O POS

## 2014-06-25 LAB — SURGICAL PCR SCREEN
MRSA, PCR: NEGATIVE
Staphylococcus aureus: NEGATIVE

## 2014-06-25 NOTE — Pre-Procedure Instructions (Addendum)
Fernando Carr  06/25/2014   Your procedure is scheduled on:  07/07/14  Report to [facility] short stay admitting at 530 AM.  Call this number if you have problems the morning of surgery: [phone]   Remember:   Do not eat food or drink liquids after midnight.   Take these medicines the morning of surgery with A SIP OF WATER: lexapro    Take all meds as ordered until day of surgery except as instructed below or per dr  Bridgette Habermann all herbel meds, nsaids (aleve,naproxen,advil,ibuprofen) 5 days prior to surgery(07/02/14) including vitamins   Do not wear jewelry, make-up or nail polish.  Do not wear lotions, powders, or perfumes. You may wear deodorant.  Do not shave 48 hours prior to surgery. Men may shave face and neck.  Do not bring valuables to the hospital.  [facility] is not responsible                  for any belongings or valuables.               Contacts, dentures or bridgework may not be worn into surgery.  Leave suitcase in the car. After surgery it may be brought to your room.  For patients admitted to the hospital, discharge time is determined by your                treatment team.               Patients discharged the day of surgery will not be allowed to drive  home.  Name and phone number of your driver:   Special Instructions:  Special Instructions: [place] - Preparing for Surgery  Before surgery, you can play an important role.  Because skin is not sterile, your skin needs to be as free of germs as possible.  You can reduce the number of germs on you skin by washing with CHG (chlorahexidine gluconate) soap before surgery.  CHG is an antiseptic cleaner which kills germs and bonds with the skin to continue killing germs even after washing.  Please DO NOT use if you have an allergy to CHG or antibacterial soaps.  If your skin becomes reddened/irritated stop using the CHG and inform your nurse when you arrive at Short Stay.  Do not shave (including legs and underarms) for  at least 48 hours prior to the first CHG shower.  You may shave your face.  Please follow these instructions carefully:   1.  Shower with CHG Soap the night before surgery and the morning of Surgery.  2.  If you choose to wash your hair, wash your hair first as usual with your normal shampoo.  3.  After you shampoo, rinse your hair and body thoroughly to remove the Shampoo.  4.  Use CHG as you would any other liquid soap.  You can apply chg directly  to the skin and wash gently with scrungie or a clean washcloth.  5.  Apply the CHG Soap to your body ONLY FROM THE NECK DOWN.  Do not use on open wounds or open sores.  Avoid contact with your eyes ears, mouth and genitals (private parts).  Wash genitals (private parts)       with your normal soap.  6.  Wash thoroughly, paying special attention to the area where your surgery will be performed.  7.  Thoroughly rinse your body with warm water from the neck down.  8.  DO NOT shower/wash with your  normal soap after using and rinsing off the CHG Soap.  9.  Pat yourself dry with a clean towel.            10.  Wear clean pajamas.            11.  Place clean sheets on your bed the night of your first shower and do not sleep with pets.  Day of Surgery  Do not apply any lotions/deodorants the morning of surgery.  Please wear clean clothes to the hospital/surgery center.   Please read over the following fact sheets that you were given: Pain Booklet, Coughing and Deep Breathing, Blood Transfusion Information, MRSA Information and Surgical Site Infection Prevention

## 2014-07-06 MED ORDER — DEXTROSE 5 % IV SOLN
1.5000 g | INTRAVENOUS | Status: AC
  Administered 2014-07-07: 1.5 g via INTRAVENOUS
  Filled 2014-07-06: qty 1.5

## 2014-07-07 ENCOUNTER — Encounter (HOSPITAL_COMMUNITY)
Admission: RE | Disposition: A | Discharge status: Skilled Nursing Facility | Payer: Medicare Other | Source: Ambulatory Visit | Attending: Vascular Surgery

## 2014-07-07 ENCOUNTER — Inpatient Hospital Stay (HOSPITAL_COMMUNITY): Payer: Medicare Other | Admitting: Anesthesiology

## 2014-07-07 ENCOUNTER — Inpatient Hospital Stay (HOSPITAL_COMMUNITY)
Admission: RE | Admit: 2014-07-07 | Discharge: 2014-07-11 | DRG: 269 | Disposition: A | Discharge status: Skilled Nursing Facility | Payer: Medicare Other | Source: Ambulatory Visit | Attending: Vascular Surgery | Admitting: Vascular Surgery

## 2014-07-07 ENCOUNTER — Encounter (HOSPITAL_COMMUNITY): Payer: Self-pay | Admitting: Anesthesiology

## 2014-07-07 ENCOUNTER — Inpatient Hospital Stay (HOSPITAL_COMMUNITY): Payer: Medicare Other

## 2014-07-07 DIAGNOSIS — I129 Hypertensive chronic kidney disease with stage 1 through stage 4 chronic kidney disease, or unspecified chronic kidney disease: Secondary | ICD-10-CM | POA: Diagnosis not present

## 2014-07-07 DIAGNOSIS — Z79899 Other long term (current) drug therapy: Secondary | ICD-10-CM | POA: Diagnosis not present

## 2014-07-07 DIAGNOSIS — R001 Bradycardia, unspecified: Secondary | ICD-10-CM | POA: Diagnosis not present

## 2014-07-07 DIAGNOSIS — I714 Abdominal aortic aneurysm, without rupture, unspecified: Secondary | ICD-10-CM | POA: Diagnosis present

## 2014-07-07 DIAGNOSIS — I959 Hypotension, unspecified: Secondary | ICD-10-CM | POA: Diagnosis not present

## 2014-07-07 DIAGNOSIS — Z48812 Encounter for surgical aftercare following surgery on the circulatory system: Secondary | ICD-10-CM | POA: Diagnosis not present

## 2014-07-07 DIAGNOSIS — Z915 Personal history of self-harm: Secondary | ICD-10-CM

## 2014-07-07 DIAGNOSIS — N183 Chronic kidney disease, stage 3 (moderate): Secondary | ICD-10-CM | POA: Diagnosis not present

## 2014-07-07 DIAGNOSIS — I951 Orthostatic hypotension: Secondary | ICD-10-CM | POA: Diagnosis not present

## 2014-07-07 DIAGNOSIS — E780 Pure hypercholesterolemia: Secondary | ICD-10-CM | POA: Diagnosis present

## 2014-07-07 DIAGNOSIS — F329 Major depressive disorder, single episode, unspecified: Secondary | ICD-10-CM | POA: Diagnosis not present

## 2014-07-07 DIAGNOSIS — Z8679 Personal history of other diseases of the circulatory system: Secondary | ICD-10-CM

## 2014-07-07 DIAGNOSIS — E785 Hyperlipidemia, unspecified: Secondary | ICD-10-CM | POA: Diagnosis present

## 2014-07-07 DIAGNOSIS — I745 Embolism and thrombosis of iliac artery: Secondary | ICD-10-CM | POA: Diagnosis not present

## 2014-07-07 DIAGNOSIS — F319 Bipolar disorder, unspecified: Secondary | ICD-10-CM | POA: Diagnosis present

## 2014-07-07 DIAGNOSIS — Z9889 Other specified postprocedural states: Secondary | ICD-10-CM

## 2014-07-07 DIAGNOSIS — Z87891 Personal history of nicotine dependence: Secondary | ICD-10-CM

## 2014-07-07 DIAGNOSIS — M6281 Muscle weakness (generalized): Secondary | ICD-10-CM | POA: Diagnosis not present

## 2014-07-07 HISTORY — PX: ABDOMINAL AORTIC ANEURYSM REPAIR: SUR1152

## 2014-07-07 HISTORY — PX: ABDOMINAL AORTIC ANEURYSM REPAIR: SHX42

## 2014-07-07 LAB — BLOOD GAS, ARTERIAL
Acid-base deficit: 1.2 mmol/L (ref 0.0–2.0)
Bicarbonate: 24.7 mEq/L — ABNORMAL HIGH (ref 20.0–24.0)
O2 Content: 10 L/min
O2 Saturation: 98.3 %
Patient temperature: 97.4
TCO2: 26.4 mmol/L (ref 0–100)
pCO2 arterial: 53.2 mmHg — ABNORMAL HIGH (ref 35.0–45.0)
pH, Arterial: 7.285 — ABNORMAL LOW (ref 7.350–7.450)
pO2, Arterial: 121 mmHg — ABNORMAL HIGH (ref 80.0–100.0)

## 2014-07-07 LAB — PROTIME-INR
INR: 1.21 (ref 0.00–1.49)
Prothrombin Time: 15.5 seconds — ABNORMAL HIGH (ref 11.6–15.2)

## 2014-07-07 LAB — CBC
HCT: 36.5 % — ABNORMAL LOW (ref 39.0–52.0)
Hemoglobin: 12 g/dL — ABNORMAL LOW (ref 13.0–17.0)
MCH: 29.2 pg (ref 26.0–34.0)
MCHC: 32.9 g/dL (ref 30.0–36.0)
MCV: 88.8 fL (ref 78.0–100.0)
Platelets: 124 10*3/uL — ABNORMAL LOW (ref 150–400)
RBC: 4.11 MIL/uL — ABNORMAL LOW (ref 4.22–5.81)
RDW: 13 % (ref 11.5–15.5)
WBC: 14.5 10*3/uL — ABNORMAL HIGH (ref 4.0–10.5)

## 2014-07-07 LAB — BASIC METABOLIC PANEL
Anion gap: 9 (ref 5–15)
BUN: 21 mg/dL (ref 6–23)
CO2: 25 mEq/L (ref 19–32)
Calcium: 9.7 mg/dL (ref 8.4–10.5)
Chloride: 106 mEq/L (ref 96–112)
Creatinine, Ser: 1.13 mg/dL (ref 0.50–1.35)
GFR calc Af Amer: 73 mL/min — ABNORMAL LOW (ref >90)
GFR calc non Af Amer: 63 mL/min — ABNORMAL LOW (ref >90)
Glucose, Bld: 134 mg/dL — ABNORMAL HIGH (ref 70–99)
Potassium: 4.1 mEq/L (ref 3.7–5.3)
Sodium: 140 mEq/L (ref 137–147)

## 2014-07-07 LAB — MAGNESIUM: Magnesium: 1.7 mg/dL (ref 1.5–2.5)

## 2014-07-07 LAB — APTT: aPTT: 33 seconds (ref 24–37)

## 2014-07-07 SURGERY — ANEURYSM ABDOMINAL AORTIC REPAIR
Anesthesia: General | Site: Abdomen

## 2014-07-07 MED ORDER — METOPROLOL TARTRATE 1 MG/ML IV SOLN: 2.0000 mg | INTRAVENOUS | Status: DC | PRN

## 2014-07-07 MED ORDER — MANNITOL 25 % IV SOLN
25.0000 g | Freq: Once | INTRAVENOUS | Status: DC
  Filled 2014-07-07: qty 100

## 2014-07-07 MED ORDER — ARTIFICIAL TEARS OP OINT
TOPICAL_OINTMENT | OPHTHALMIC | Status: DC | PRN
  Administered 2014-07-07: 1 via OPHTHALMIC

## 2014-07-07 MED ORDER — POTASSIUM CHLORIDE CRYS ER 20 MEQ PO TBCR: 20.0000 meq | EXTENDED_RELEASE_TABLET | Freq: Every day | ORAL | Status: DC | PRN

## 2014-07-07 MED ORDER — BISACODYL 10 MG RE SUPP: 10.0000 mg | Freq: Every day | RECTAL | Status: DC | PRN

## 2014-07-07 MED ORDER — MANNITOL 25 % IV SOLN
INTRAVENOUS | Status: DC | PRN
  Administered 2014-07-07: 25 g via INTRAVENOUS

## 2014-07-07 MED ORDER — LACTATED RINGERS IV SOLN
INTRAVENOUS | Status: DC | PRN
Start: 2014-07-07 — End: 2014-07-07
  Administered 2014-07-07: 07:00:00 via INTRAVENOUS

## 2014-07-07 MED ORDER — FENTANYL CITRATE 0.05 MG/ML IJ SOLN
INTRAMUSCULAR | Status: AC
  Filled 2014-07-07: qty 5

## 2014-07-07 MED ORDER — ALUM & MAG HYDROXIDE-SIMETH 200-200-20 MG/5ML PO SUSP
15.0000 mL | ORAL | Status: DC | PRN
Start: 2014-07-07 — End: 2014-07-11

## 2014-07-07 MED ORDER — ENOXAPARIN SODIUM 30 MG/0.3ML ~~LOC~~ SOLN
30.0000 mg | SUBCUTANEOUS | Status: DC
  Administered 2014-07-08: 30 mg via SUBCUTANEOUS
  Filled 2014-07-07 (×2): qty 0.3

## 2014-07-07 MED ORDER — PHENOL 1.4 % MT LIQD: 1.0000 | OROMUCOSAL | Status: DC | PRN

## 2014-07-07 MED ORDER — EPHEDRINE SULFATE 50 MG/ML IJ SOLN
INTRAMUSCULAR | Status: DC | PRN
  Administered 2014-07-07: 10 mg via INTRAVENOUS

## 2014-07-07 MED ORDER — OXYCODONE-ACETAMINOPHEN 5-325 MG PO TABS
1.0000 | ORAL_TABLET | ORAL | Status: DC | PRN
  Administered 2014-07-08: 2 via ORAL
  Administered 2014-07-09 – 2014-07-10 (×2): 1 via ORAL
  Filled 2014-07-07 (×2): qty 1
  Filled 2014-07-07: qty 2

## 2014-07-07 MED ORDER — CHLORHEXIDINE GLUCONATE CLOTH 2 % EX PADS: 6.0000 | MEDICATED_PAD | Freq: Once | CUTANEOUS | Status: DC

## 2014-07-07 MED ORDER — SODIUM CHLORIDE 0.9 % IV SOLN: 500.0000 mL | Freq: Once | INTRAVENOUS | Status: AC | PRN

## 2014-07-07 MED ORDER — HYDRALAZINE HCL 20 MG/ML IJ SOLN
INTRAMUSCULAR | Status: DC | PRN
  Administered 2014-07-07 (×4): 5 mg via INTRAVENOUS

## 2014-07-07 MED ORDER — MORPHINE SULFATE 2 MG/ML IJ SOLN
2.0000 mg | INTRAMUSCULAR | Status: DC | PRN
  Administered 2014-07-07 – 2014-07-08 (×4): 2 mg via INTRAVENOUS
  Administered 2014-07-08 (×2): 4 mg via INTRAVENOUS
  Administered 2014-07-08: 2 mg via INTRAVENOUS
  Administered 2014-07-08: 4 mg via INTRAVENOUS
  Administered 2014-07-08: 2 mg via INTRAVENOUS
  Filled 2014-07-07 (×3): qty 1
  Filled 2014-07-07 (×2): qty 2
  Filled 2014-07-07: qty 1
  Filled 2014-07-07 (×2): qty 2

## 2014-07-07 MED ORDER — HYDRALAZINE HCL 20 MG/ML IJ SOLN
5.0000 mg | INTRAMUSCULAR | Status: DC | PRN
  Filled 2014-07-07: qty 0.25

## 2014-07-07 MED ORDER — FENOFIBRATE 54 MG PO TABS
54.0000 mg | ORAL_TABLET | Freq: Every day | ORAL | Status: DC
  Administered 2014-07-09 – 2014-07-11 (×3): 54 mg via ORAL
  Filled 2014-07-07 (×5): qty 1

## 2014-07-07 MED ORDER — OXYCODONE HCL 5 MG PO TABS: 5.0000 mg | ORAL_TABLET | Freq: Once | ORAL | Status: DC | PRN

## 2014-07-07 MED ORDER — PHENYLEPHRINE HCL 10 MG/ML IJ SOLN
10.0000 mg | INTRAVENOUS | Status: DC | PRN
  Administered 2014-07-07: 40 ug/min via INTRAVENOUS

## 2014-07-07 MED ORDER — ACETAMINOPHEN 650 MG RE SUPP: 325.0000 mg | RECTAL | Status: DC | PRN

## 2014-07-07 MED ORDER — MAGNESIUM SULFATE 2 GM/50ML IV SOLN
2.0000 g | Freq: Every day | INTRAVENOUS | Status: AC | PRN
  Administered 2014-07-08: 2 g via INTRAVENOUS
  Filled 2014-07-07: qty 50

## 2014-07-07 MED ORDER — LACTATED RINGERS IV SOLN
INTRAVENOUS | Status: DC | PRN
  Administered 2014-07-07: 06:00:00 via INTRAVENOUS

## 2014-07-07 MED ORDER — SODIUM CHLORIDE 0.9 % IV SOLN: INTRAVENOUS | Status: DC

## 2014-07-07 MED ORDER — FENTANYL CITRATE 0.05 MG/ML IJ SOLN
INTRAMUSCULAR | Status: DC | PRN
  Administered 2014-07-07: 100 ug via INTRAVENOUS
  Administered 2014-07-07: 150 ug via INTRAVENOUS
  Administered 2014-07-07 (×2): 50 ug via INTRAVENOUS
  Administered 2014-07-07: 100 ug via INTRAVENOUS
  Administered 2014-07-07 (×2): 50 ug via INTRAVENOUS
  Administered 2014-07-07: 400 ug via INTRAVENOUS

## 2014-07-07 MED ORDER — CHLORHEXIDINE GLUCONATE 0.12 % MT SOLN
15.0000 mL | Freq: Two times a day (BID) | OROMUCOSAL | Status: DC
  Administered 2014-07-07 – 2014-07-08 (×2): 15 mL via OROMUCOSAL
  Filled 2014-07-07 (×2): qty 15

## 2014-07-07 MED ORDER — KCL IN DEXTROSE-NACL 20-5-0.45 MEQ/L-%-% IV SOLN
INTRAVENOUS | Status: DC
  Administered 2014-07-07: 20:00:00 via INTRAVENOUS
  Administered 2014-07-07: 1000 mL via INTRAVENOUS
  Administered 2014-07-08 (×2): via INTRAVENOUS
  Filled 2014-07-07 (×15): qty 1000

## 2014-07-07 MED ORDER — NEOSTIGMINE METHYLSULFATE 10 MG/10ML IV SOLN
INTRAVENOUS | Status: DC | PRN
  Administered 2014-07-07: 5 mg via INTRAVENOUS

## 2014-07-07 MED ORDER — SIMVASTATIN 10 MG PO TABS
10.0000 mg | ORAL_TABLET | Freq: Every day | ORAL | Status: DC
  Administered 2014-07-09 – 2014-07-11 (×3): 10 mg via ORAL
  Filled 2014-07-07 (×5): qty 1

## 2014-07-07 MED ORDER — PANTOPRAZOLE SODIUM 40 MG IV SOLR: 40.0000 mg | INTRAVENOUS | Status: DC

## 2014-07-07 MED ORDER — CETYLPYRIDINIUM CHLORIDE 0.05 % MT LIQD
7.0000 mL | Freq: Two times a day (BID) | OROMUCOSAL | Status: DC
  Administered 2014-07-07 – 2014-07-08 (×3): 7 mL via OROMUCOSAL

## 2014-07-07 MED ORDER — PROPOFOL 10 MG/ML IV BOLUS
INTRAVENOUS | Status: AC
  Filled 2014-07-07: qty 20

## 2014-07-07 MED ORDER — VECURONIUM BROMIDE 10 MG IV SOLR
INTRAVENOUS | Status: AC
  Filled 2014-07-07: qty 10

## 2014-07-07 MED ORDER — LABETALOL HCL 5 MG/ML IV SOLN
10.0000 mg | INTRAVENOUS | Status: DC | PRN
  Administered 2014-07-07 – 2014-07-08 (×2): 10 mg via INTRAVENOUS
  Filled 2014-07-07 (×3): qty 4

## 2014-07-07 MED ORDER — LABETALOL HCL 5 MG/ML IV SOLN
INTRAVENOUS | Status: DC | PRN
  Administered 2014-07-07 (×2): 5 mg via INTRAVENOUS

## 2014-07-07 MED ORDER — METOPROLOL TARTRATE 1 MG/ML IV SOLN
2.5000 mg | Freq: Four times a day (QID) | INTRAVENOUS | Status: DC
Start: 2014-07-07 — End: 2014-07-11
  Administered 2014-07-07 – 2014-07-11 (×14): 2.5 mg via INTRAVENOUS
  Filled 2014-07-07 (×20): qty 5

## 2014-07-07 MED ORDER — KCL IN DEXTROSE-NACL 20-5-0.45 MEQ/L-%-% IV SOLN
INTRAVENOUS | Status: AC
  Filled 2014-07-07: qty 1000

## 2014-07-07 MED ORDER — ALBUMIN HUMAN 5 % IV SOLN
INTRAVENOUS | Status: DC | PRN
  Administered 2014-07-07: 10:00:00 via INTRAVENOUS

## 2014-07-07 MED ORDER — ONDANSETRON HCL 4 MG/2ML IJ SOLN: 4.0000 mg | Freq: Four times a day (QID) | INTRAMUSCULAR | Status: DC | PRN

## 2014-07-07 MED ORDER — RISPERIDONE 1 MG PO TABS
1.0000 mg | ORAL_TABLET | Freq: Every day | ORAL | Status: DC
  Administered 2014-07-08 – 2014-07-10 (×2): 1 mg via ORAL
  Filled 2014-07-07 (×7): qty 1

## 2014-07-07 MED ORDER — GUAIFENESIN-DM 100-10 MG/5ML PO SYRP
15.0000 mL | ORAL_SOLUTION | ORAL | Status: DC | PRN
  Administered 2014-07-11: 15 mL via ORAL
  Filled 2014-07-07: qty 15

## 2014-07-07 MED ORDER — ROCURONIUM BROMIDE 100 MG/10ML IV SOLN
INTRAVENOUS | Status: DC | PRN
  Administered 2014-07-07: 50 mg via INTRAVENOUS

## 2014-07-07 MED ORDER — SENNOSIDES-DOCUSATE SODIUM 8.6-50 MG PO TABS
1.0000 | ORAL_TABLET | Freq: Every evening | ORAL | Status: DC | PRN
  Filled 2014-07-07: qty 1

## 2014-07-07 MED ORDER — GLYCOPYRROLATE 0.2 MG/ML IJ SOLN
INTRAMUSCULAR | Status: DC | PRN
  Administered 2014-07-07: 0.2 mg via INTRAVENOUS
  Administered 2014-07-07: .8 mg via INTRAVENOUS

## 2014-07-07 MED ORDER — DOCUSATE SODIUM 100 MG PO CAPS
100.0000 mg | ORAL_CAPSULE | Freq: Every day | ORAL | Status: DC
  Administered 2014-07-09 – 2014-07-11 (×3): 100 mg via ORAL
  Filled 2014-07-07 (×3): qty 1

## 2014-07-07 MED ORDER — GLYCOPYRROLATE 0.2 MG/ML IJ SOLN
INTRAMUSCULAR | Status: AC
  Filled 2014-07-07: qty 1

## 2014-07-07 MED ORDER — MIDAZOLAM HCL 5 MG/5ML IJ SOLN
INTRAMUSCULAR | Status: DC | PRN
  Administered 2014-07-07: .5 mg via INTRAVENOUS
  Administered 2014-07-07: 1 mg via INTRAVENOUS
  Administered 2014-07-07: .5 mg via INTRAVENOUS

## 2014-07-07 MED ORDER — ESCITALOPRAM OXALATE 20 MG PO TABS
20.0000 mg | ORAL_TABLET | Freq: Every day | ORAL | Status: DC
  Administered 2014-07-08 – 2014-07-11 (×4): 20 mg via ORAL
  Filled 2014-07-07 (×4): qty 1

## 2014-07-07 MED ORDER — MIDAZOLAM HCL 2 MG/2ML IJ SOLN
INTRAMUSCULAR | Status: AC
  Filled 2014-07-07: qty 2

## 2014-07-07 MED ORDER — 0.9 % SODIUM CHLORIDE (POUR BTL) OPTIME
TOPICAL | Status: DC | PRN
  Administered 2014-07-07 (×2): 1000 mL

## 2014-07-07 MED ORDER — DEXTROSE 5 % IV SOLN
1.5000 g | Freq: Two times a day (BID) | INTRAVENOUS | Status: AC
  Administered 2014-07-07 – 2014-07-08 (×2): 1.5 g via INTRAVENOUS
  Filled 2014-07-07 (×2): qty 1.5

## 2014-07-07 MED ORDER — MEPERIDINE HCL 25 MG/ML IJ SOLN: 6.2500 mg | INTRAMUSCULAR | Status: DC | PRN

## 2014-07-07 MED ORDER — PANTOPRAZOLE SODIUM 40 MG IV SOLR
40.0000 mg | INTRAVENOUS | Status: DC
  Administered 2014-07-07 – 2014-07-09 (×3): 40 mg via INTRAVENOUS
  Filled 2014-07-07 (×3): qty 40

## 2014-07-07 MED ORDER — PROPOFOL 10 MG/ML IV BOLUS
INTRAVENOUS | Status: DC | PRN
  Administered 2014-07-07: 130 mg via INTRAVENOUS

## 2014-07-07 MED ORDER — PANTOPRAZOLE SODIUM 40 MG PO TBEC: 40.0000 mg | DELAYED_RELEASE_TABLET | Freq: Every day | ORAL | Status: DC

## 2014-07-07 MED ORDER — LACTATED RINGERS IV SOLN
INTRAVENOUS | Status: DC | PRN
  Administered 2014-07-07: 07:00:00 via INTRAVENOUS

## 2014-07-07 MED ORDER — HYDROMORPHONE HCL 1 MG/ML IJ SOLN
INTRAMUSCULAR | Status: AC
  Filled 2014-07-07: qty 1

## 2014-07-07 MED ORDER — HEPARIN SODIUM (PORCINE) 1000 UNIT/ML IJ SOLN
INTRAMUSCULAR | Status: DC | PRN
  Administered 2014-07-07: 9000 [IU] via INTRAVENOUS

## 2014-07-07 MED ORDER — VECURONIUM BROMIDE 10 MG IV SOLR
INTRAVENOUS | Status: DC | PRN
  Administered 2014-07-07: 1 mg via INTRAVENOUS
  Administered 2014-07-07: 3 mg via INTRAVENOUS
  Administered 2014-07-07 (×2): 2 mg via INTRAVENOUS

## 2014-07-07 MED ORDER — SODIUM CHLORIDE 0.9 % IR SOLN
Status: DC | PRN
  Administered 2014-07-07: 500 mL

## 2014-07-07 MED ORDER — HYDROMORPHONE HCL 1 MG/ML IJ SOLN
0.2500 mg | INTRAMUSCULAR | Status: DC | PRN
  Administered 2014-07-07: 0.25 mg via INTRAVENOUS

## 2014-07-07 MED ORDER — LIDOCAINE HCL (CARDIAC) 20 MG/ML IV SOLN
INTRAVENOUS | Status: DC | PRN
  Administered 2014-07-07: 100 mg via INTRAVENOUS

## 2014-07-07 MED ORDER — OXYCODONE HCL 5 MG/5ML PO SOLN: 5.0000 mg | Freq: Once | ORAL | Status: DC | PRN

## 2014-07-07 MED ORDER — PROTAMINE SULFATE 10 MG/ML IV SOLN
INTRAVENOUS | Status: DC | PRN
  Administered 2014-07-07: 50 mg via INTRAVENOUS

## 2014-07-07 MED ORDER — ACETAMINOPHEN 325 MG PO TABS: 325.0000 mg | ORAL_TABLET | ORAL | Status: DC | PRN

## 2014-07-07 MED ORDER — CEFAZOLIN SODIUM-DEXTROSE 2-3 GM-% IV SOLR
INTRAVENOUS | Status: AC
  Filled 2014-07-07: qty 50

## 2014-07-07 MED ORDER — FENTANYL CITRATE 0.05 MG/ML IJ SOLN
INTRAMUSCULAR | Status: AC
  Filled 2014-07-07: qty 2

## 2014-07-07 MED ORDER — ONDANSETRON HCL 4 MG/2ML IJ SOLN: 4.0000 mg | Freq: Once | INTRAMUSCULAR | Status: DC | PRN

## 2014-07-07 MED FILL — Heparin Sodium (Porcine) Inj 1000 Unit/ML: INTRAMUSCULAR | Qty: 30 | Status: AC

## 2014-07-07 MED FILL — Sodium Chloride Irrigation Soln 0.9%: Qty: 1000 | Status: AC

## 2014-07-07 SURGICAL SUPPLY — 47 items
CANISTER SUCTION 2500CC (MISCELLANEOUS) ×2 IMPLANT
CANNULA VESSEL 3MM 2 BLNT TIP (CANNULA) ×2 IMPLANT
CLIP LIGATING EXTRA MED SLVR (CLIP) ×2 IMPLANT
CLIP LIGATING EXTRA SM BLUE (MISCELLANEOUS) ×2 IMPLANT
COVER SURGICAL LIGHT HANDLE (MISCELLANEOUS) ×2 IMPLANT
DRSG COVADERM 4X14 (GAUZE/BANDAGES/DRESSINGS) ×1 IMPLANT
DRSG COVADERM 4X6 (GAUZE/BANDAGES/DRESSINGS) ×1 IMPLANT
ELECT BLADE 4.0 EZ CLEAN MEGAD (MISCELLANEOUS)
ELECT REM PT RETURN 9FT ADLT (ELECTROSURGICAL) ×2
ELECTRODE BLDE 4.0 EZ CLN MEGD (MISCELLANEOUS) IMPLANT
ELECTRODE REM PT RTRN 9FT ADLT (ELECTROSURGICAL) ×1 IMPLANT
GLOVE BIO SURGEON STRL SZ 6.5 (GLOVE) ×1 IMPLANT
GLOVE BIOGEL PI IND STRL 6.5 (GLOVE) IMPLANT
GLOVE BIOGEL PI IND STRL 7.0 (GLOVE) IMPLANT
GLOVE BIOGEL PI IND STRL 7.5 (GLOVE) IMPLANT
GLOVE BIOGEL PI INDICATOR 6.5 (GLOVE) ×1
GLOVE BIOGEL PI INDICATOR 7.0 (GLOVE) ×1
GLOVE BIOGEL PI INDICATOR 7.5 (GLOVE) ×1
GLOVE ECLIPSE 7.0 STRL STRAW (GLOVE) ×1 IMPLANT
GLOVE SS BIOGEL STRL SZ 7.5 (GLOVE) ×1 IMPLANT
GLOVE SUPERSENSE BIOGEL SZ 7.5 (GLOVE) ×1
GLOVE SURG SS PI 7.0 STRL IVOR (GLOVE) ×4 IMPLANT
GOWN STRL REUS W/ TWL LRG LVL3 (GOWN DISPOSABLE) ×3 IMPLANT
GOWN STRL REUS W/TWL LRG LVL3 (GOWN DISPOSABLE) ×10
GRAFT HEMASHIELD 18X9MM (Vascular Products) ×1 IMPLANT
INSERT FOGARTY 61MM (MISCELLANEOUS) ×4 IMPLANT
INSERT FOGARTY SM (MISCELLANEOUS) ×8 IMPLANT
KIT BASIN OR (CUSTOM PROCEDURE TRAY) ×2 IMPLANT
KIT ROOM TURNOVER OR (KITS) ×2 IMPLANT
NS IRRIG 1000ML POUR BTL (IV SOLUTION) ×4 IMPLANT
PACK AORTA (CUSTOM PROCEDURE TRAY) ×2 IMPLANT
PAD ARMBOARD 7.5X6 YLW CONV (MISCELLANEOUS) ×4 IMPLANT
PROBE PENCIL 8 MHZ STRL DISP (MISCELLANEOUS) IMPLANT
SPONGE LAP 18X18 X RAY DECT (DISPOSABLE) IMPLANT
STAPLER VISISTAT 35W (STAPLE) ×4 IMPLANT
SUT ETHIBOND 5 LR DA (SUTURE) IMPLANT
SUT PDS AB 1 TP1 54 (SUTURE) ×4 IMPLANT
SUT PROLENE 3 0 SH1 36 (SUTURE) ×4 IMPLANT
SUT PROLENE 5 0 C 1 24 (SUTURE) ×2 IMPLANT
SUT PROLENE 5 0 C 1 36 (SUTURE) ×1 IMPLANT
SUT SILK 2 0 SH CR/8 (SUTURE) ×2 IMPLANT
SUT VIC AB 2-0 CT1 36 (SUTURE) ×4 IMPLANT
SUT VIC AB 3-0 SH 27 (SUTURE) ×4
SUT VIC AB 3-0 SH 27X BRD (SUTURE) ×2 IMPLANT
TOWEL BLUE STERILE X RAY DET (MISCELLANEOUS) ×4 IMPLANT
TRAY FOLEY CATH 16FRSI W/METER (SET/KITS/TRAYS/PACK) ×2 IMPLANT
WATER STERILE IRR 1000ML POUR (IV SOLUTION) ×3 IMPLANT

## 2014-07-07 NOTE — Plan of Care (Signed)
Problem: Phase I Progression Outcomes Goal: Pain controlled with appropriate interventions Outcome: Completed/Met Date Met:  07/07/14     

## 2014-07-07 NOTE — Interval H&P Note (Signed)
History and Physical Interval Note:  07/07/2014 5:52 AM  Fernando Carr  has presented today for surgery, with the diagnosis of Abdominal Aortic Aneurysm I71.4  The various methods of treatment have been discussed with the patient and family. After consideration of risks, benefits and other options for treatment, the patient has consented to  Procedure(s): ANEURYSM ABDOMINAL AORTIC REPAIR (N/A) as a surgical intervention .  The patient's history has been reviewed, patient examined, no change in status, stable for surgery.  I have reviewed the patient's chart and labs.  Questions were answered to the patient's satisfaction.     Sanaii Caporaso

## 2014-07-07 NOTE — Progress Notes (Signed)
Utilization Review Completed.  

## 2014-07-07 NOTE — H&P (View-Only) (Signed)
Here today for continued discussion regarding open aneurysm repair. He has undergone noninvasive cardiac nucleotide study in this shows a low risk from a cardiac standpoint.  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 Discussed that 1-2 days in the intensive care approximately hospitalization. He does not have any family support in the area and is concerned regarding this. I explained that the nurse case management would be involved and that if necessary he would go to an intermediate skilled nursing facility prior to discharge to home.  Past Medical History  Diagnosis Date  . High cholesterol   . Bipolar disorder   . AAA (abdominal aortic aneurysm)   . Depression   . Renal insufficiency     chronic renal   . Suicide attempt aug. 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 Smoking status: Former Smoker -- 1.00 packs/day for 40 years    Types: Cigarettes    Quit date: 03/11/2014  . Smokeless tobacco: Never Used  . Alcohol Use: No    Family History  Problem Relation Age of Onset  . Alcohol abuse Mother   . Alcohol abuse Father   . Suicidality Paternal Uncle   . Suicidality Cousin   . Depression Daughter     No Known Allergies  Current outpatient prescriptions: escitalopram (LEXAPRO) 20 MG tablet, Take 1 tablet (20 mg total) by mouth daily., Disp: 30 tablet, Rfl: 1;  fenofibrate micronized (LOFIBRA) 67 MG capsule, Take 67 mg by mouth daily. , Disp: , Rfl: ;  fludrocortisone (FLORINEF) 0.1 MG tablet, Take 1 tablet (0.1 mg total) by mouth daily., Disp: 14 tablet, Rfl: 0 risperiDONE (RISPERDAL) 1 MG tablet, Take 1 tablet (1 mg total) by mouth at bedtime., Disp: 30 tablet, Rfl: 1;  simvastatin (ZOCOR) 20 MG tablet, Take 10 mg by mouth daily. , Disp: , Rfl:   BP 101/56 mmHg  Pulse 62  Ht 6' (1.829 m)  Wt 217 lb (98.431 kg)  BMI 29.42 kg/m2  SpO2 100%  Body mass index is 29.42  kg/(m^2).       Physical exam his abdomen soft nontender no masses noted. He does have prominent aortic pulsation. 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 I did discuss the procedure. Recommend surgery and his elective schedule this for 07/07/2014

## 2014-07-07 NOTE — Anesthesia Postprocedure Evaluation (Signed)
Anesthesia Post Note  Patient: Fernando Carr  Procedure(s) Performed: Procedure(s) (LRB): ABDOMINAL AORTIC ANEURYSM REPAIR (N/A)  Anesthesia type: general  Patient location: PACU  Post pain: Pain level controlled  Post assessment: Patient's Cardiovascular Status Stable  Last Vitals:  Filed Vitals:   07/07/14 1700  BP: 120/43  Pulse: 63  Temp: 36.6 C  Resp: 20    Post vital signs: Reviewed and stable  Level of consciousness: sedated  Complications: No apparent anesthesia complications

## 2014-07-07 NOTE — Op Note (Signed)
OPERATIVE REPORT  DATE OF SURGERY: 07/07/2014  PATIENT: Fernando Carr, 73 y.o. male MRN: [id number]  DOB: 02-Nov-1940  PRE-OPERATIVE DIAGNOSIS: AAA  POST-OPERATIVE DIAGNOSIS:  Same  PROCEDURE: Resection and grafting of abdominal aortic aneurysm. Repair with a 18 x 9 Hemashield aorta and bilateral common iliac arteries  SURGEON:  Curt Jews, M.D.  PHYSICIAN ASSISTANT: Collins  ANESTHESIA:  Gen.  EBL: 650 ml  Total I/O In: 2600 [I.V.:2050; Blood:300; IV Piggyback:250] Out: 1030 [Urine:370; Blood:660]  BLOOD ADMINISTERED: Cell Saver  DRAINS: None  SPECIMEN: None  COUNTS CORRECT:  YES  PLAN OF CARE: PACU extubated   PATIENT DISPOSITION:  PACU - hemodynamically stable  PROCEDURE DETAILS: Patient was taken to the operative placed supine position where the area of the abdomen and both groins were prepped and draped in usual sterile fashion. Incision was made to the level of xiphoid to below the umbilicus towards the pubis and carried down through the subcutaneous fat with electrocautery. The midline fascia was opened in line with skin incision with electrocautery. The peritoneum was entered and the intraperitoneal contents were explored. The small bowel and large bowel: Liver stomach were all normal. The omni-Trac retractor were used for exposure. The transverse colon omentum reflected superiorly and the small bowel was reflected to the right. The retroperitoneum was opened and the aorta was exposed by further mobilization of the duodenum to the right. Dissection was tented up to the left renal vein and the aorta was encircled below the level of the renal arteries. The artery was acceptable for anastomosis at this level. Dissection was extended down towards the bifurcation. The intermesenteric artery was identified and was ligated proximally and transected. 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 Dissection was conducted on to the bifurcation the patient had a ectatic  left common and slightly aneurysmal right common iliac artery. These were exposed further down towards the iliac bifurcation and encircled with vessel loops bilaterally. The patient was given 9000 units heparin and 25 mg of mannitol. After circulation time the aorta was occluded below the level the renal arteries with a Hartmann clamp. The common iliac arteries were occluded on the distal segment bilaterally with Kelly clamps. The aorta was opened with electrocautery. There were several large lumbar back bleeders and these were all controlled with 2-0 silk figure-of-eight sutures. The aorta was transected below the level of the proximal clamp and the iliac arteries were transected bilaterally above the distal clamps. An 18 x 9 Hemashield graft was brought onto the field. This was cut to appropriate dimension and using a felt strip reinforcement on the proximal anastomosis was sewn into into the aorta with a running 3-0 Prolene suture. This anastomosis was tested and found to be adequate. Clamps were placed on both limbs of the graft. The left limb of the graft was cut to appropriate length and was sewn into into the left common iliac artery with a running 5-0 Prolene suture prior to completion of the closure the usual flushing maneuvers were undertaken. The anastomosis was completed and flow was restored to the left leg. Next the right common iliac artery was exposed. There was a's chronic dissection plane and this was resected. The artery above the iliac bifurcation was acceptable for anastomosis. The graft was cut to appropriate length and was sewn into into the common iliac artery on the right with a running 5-0 Prolene suture. Again the usual flushing maneuvers were undertaken prior to completion of anastomosis. After completion clamp from excellent  flow to the right leg. The patient was given 50 mg of protamine to reverse the heparin. Wounds irrigated with saline. Hemostasis tablet cautery. The aneurysm sac was  closed over the graft with a running 2-0 Vicryl suture. Next the retroperitoneum was closed with a running 2-0 Vicryl suture. The small bowel was run in its entirety and found to be without injury was placed back in the pelvis. The transverse colon omentum replaced over this. Midline fascia was closed with #1 PDS suture beginning proximally and distally and tying in the middle. The wound again was irrigated with saline and hemostasis was obtained left cautery. The wound was closed with 3-0 Vicryl in the subcutaneous tissue. Benzoin and Steri-Strip for applied. The patient was transferred to the recovery room after extubation. He had palpable pedal pulses bilaterally   Curt Jews, M.D. 07/07/2014 10:52 AM

## 2014-07-07 NOTE — Progress Notes (Signed)
Called Dr.Ossey for PACU orders, also notified him pt's BP is 34-28'J Systolic, bolus infusing as per his orders, will cont to assess, report given to [employer]

## 2014-07-07 NOTE — Anesthesia Preprocedure Evaluation (Addendum)
Anesthesia Evaluation  Patient identified by MRN, date of birth, ID band Patient awake    Reviewed: Allergy & Precautions, H&P , NPO status , Patient's Chart, lab work & pertinent test results, reviewed documented beta blocker date and time   History of Anesthesia Complications Negative for: history of anesthetic complications  Airway Mallampati: II  TM Distance: <3 FB Neck ROM: Full    Dental  (+) Teeth Intact, Dental Advisory Given   Pulmonary former smoker,          Cardiovascular hypertension, Pt. on medications + Peripheral Vascular Disease     Neuro/Psych Depression Bipolar Disorder negative neurological ROS     GI/Hepatic negative GI ROS, Neg liver ROS,   Endo/Other  negative endocrine ROS  Renal/GU Renal InsufficiencyRenal disease     Musculoskeletal negative musculoskeletal ROS (+)   Abdominal   Peds  Hematology negative hematology ROS (+)   Anesthesia Other Findings   Reproductive/Obstetrics negative OB ROS                           Anesthesia Physical Anesthesia Plan  ASA: III  Anesthesia Plan: General   Post-op Pain Management:    Induction: Intravenous  Airway Management Planned: Oral ETT  Additional Equipment: Arterial line, CVP, PA Cath and Ultrasound Guidance Line Placement  Intra-op Plan:   Post-operative Plan: Possible Post-op intubation/ventilation  Informed Consent: I have reviewed the patients History and Physical, chart, labs and discussed the procedure including the risks, benefits and alternatives for the proposed anesthesia with the patient or authorized representative who has indicated his/her understanding and acceptance.     Plan Discussed with: CRNA and Surgeon  Anesthesia Plan Comments:         Anesthesia Quick Evaluation

## 2014-07-07 NOTE — Progress Notes (Signed)
  Day of Surgery Note    Subjective:  Sleepy-awakes to voice   Filed Vitals:   07/07/14 1300  BP: 121/51  Pulse: 92  Temp: 96.6 F (35.9 C)  Resp: 18    Incisions:   covered Extremities:  Bilateral feet are warm 2+ palpable right PT; 2+ left DP Cardiac:  Regular/ irregular Lungs:  Decreased BS at bases Abdomen:  Soft, NT/ND  Assessment/Plan:  This is a 73 y.o. male who is s/p Resection and grafting of abdominal aortic aneurysm. Repair with a 18 x 9 Hemashield aorta and bilateral common iliac arteries   -pt doing well this afternoon from surgical standpoint.  His pain is well controlled -he has palpable pedal pulses -SCD's to be placed and Lovenox to start tomorrow for DVT prophylaxis.  -will start Protonix IV until pt is taking po's -will start scheduled low dose Metoprolol q6hr   Leontine Locket, PA-C 07/07/2014 1:45 PM

## 2014-07-07 NOTE — Transfer of Care (Signed)
Immediate Anesthesia Transfer of Care Note  Patient: Fernando Carr  Procedure(s) Performed: Procedure(s): ABDOMINAL AORTIC ANEURYSM REPAIR (N/A)  Patient Location: PACU  Anesthesia Type:General  Level of Consciousness: awake, alert , oriented and patient cooperative  Airway & Oxygen Therapy: Patient Spontanous Breathing and Patient connected to nasal cannula oxygen  Post-op Assessment: Report given to PACU RN and Post -op Vital signs reviewed and stable  Post vital signs: Reviewed and stable  Complications: No apparent anesthesia complications

## 2014-07-08 ENCOUNTER — Inpatient Hospital Stay (HOSPITAL_COMMUNITY): Payer: Medicare Other

## 2014-07-08 ENCOUNTER — Encounter (HOSPITAL_COMMUNITY): Payer: Self-pay | Admitting: Vascular Surgery

## 2014-07-08 LAB — COMPREHENSIVE METABOLIC PANEL
ALT: 8 U/L (ref 0–53)
AST: 13 U/L (ref 0–37)
Albumin: 2.8 g/dL — ABNORMAL LOW (ref 3.5–5.2)
Alkaline Phosphatase: 71 U/L (ref 39–117)
Anion gap: 9 (ref 5–15)
BUN: 15 mg/dL (ref 6–23)
CO2: 24 mEq/L (ref 19–32)
Calcium: 9.7 mg/dL (ref 8.4–10.5)
Chloride: 107 mEq/L (ref 96–112)
Creatinine, Ser: 1.02 mg/dL (ref 0.50–1.35)
GFR calc Af Amer: 82 mL/min — ABNORMAL LOW (ref >90)
GFR calc non Af Amer: 71 mL/min — ABNORMAL LOW (ref >90)
Glucose, Bld: 130 mg/dL — ABNORMAL HIGH (ref 70–99)
Potassium: 4.6 mEq/L (ref 3.7–5.3)
Sodium: 140 mEq/L (ref 137–147)
Total Bilirubin: 0.4 mg/dL (ref 0.3–1.2)
Total Protein: 5.6 g/dL — ABNORMAL LOW (ref 6.0–8.3)

## 2014-07-08 LAB — CBC
HCT: 36.2 % — ABNORMAL LOW (ref 39.0–52.0)
Hemoglobin: 11.6 g/dL — ABNORMAL LOW (ref 13.0–17.0)
MCH: 28.9 pg (ref 26.0–34.0)
MCHC: 32 g/dL (ref 30.0–36.0)
MCV: 90 fL (ref 78.0–100.0)
Platelets: 123 10*3/uL — ABNORMAL LOW (ref 150–400)
RBC: 4.02 MIL/uL — ABNORMAL LOW (ref 4.22–5.81)
RDW: 13.2 % (ref 11.5–15.5)
WBC: 8.8 10*3/uL (ref 4.0–10.5)

## 2014-07-08 LAB — MAGNESIUM: Magnesium: 1.6 mg/dL (ref 1.5–2.5)

## 2014-07-08 LAB — AMYLASE: Amylase: 98 U/L (ref 0–105)

## 2014-07-08 MED ORDER — ENOXAPARIN SODIUM 40 MG/0.4ML ~~LOC~~ SOLN
40.0000 mg | SUBCUTANEOUS | Status: DC
  Administered 2014-07-09 – 2014-07-11 (×3): 40 mg via SUBCUTANEOUS
  Filled 2014-07-08 (×4): qty 0.4

## 2014-07-08 NOTE — Plan of Care (Signed)
Problem: Consults Goal: Skin Care Protocol Initiated - if Braden Score 18 or less If consults are not indicated, leave blank or document N/A Outcome: Completed/Met Date Met:  07/08/14  Problem: Phase I Progression Outcomes Goal: Vascular site scale level 0 - I Vascular Site Scale Level 0: No bruising/bleeding/hematoma Level I (Mild): Bruising/Ecchymosis, minimal bleeding/ooozing, palpable hematoma < 3 cm Level II (Moderate): Bleeding not affecting hemodynamic parameters, pseudoaneurysm, palpable hematoma > 3 cm Level III (Severe) Bleeding which affects hemodynamic parameters or retroperitoneal hemorrhage  Outcome: Not Applicable Date Met:  07/08/14     

## 2014-07-08 NOTE — Progress Notes (Signed)
Pt found to have pulled out arterial line.  Pt states that "he didn't know what it was or what is going on and he wanted all that tape off."  Pressure held on site until hemostasis was obtained.  Pt easily re-oriented.  Pt also instructed that if he questions the reason for equipment to ask before personally removing.  Bed alarm on.  Will continue to monitor closely.

## 2014-07-08 NOTE — Evaluation (Signed)
Occupational Therapy Evaluation Patient Details Name: Fernando Carr MRN: [id number] DOB: 1941/06/27 Today's Date: 07/08/2014    History of Present Illness Pt admitted for AAA repair with PMHx of bipolar, depression and suicide attempt, HLD   Clinical Impression   Pt admitted with above. He demonstrates the below listed deficits and will benefit from continued OT to maximize safety and independence with BADLs. Pt presents to OT with generalized weakness, acute pain, and bil. UE tremors (which he reports new onset 7/15).  Currently, he requires mod A for LB ADLs.  He lives alone with limited supports.  Recommend SNF level rehab at discharge.       Follow Up Recommendations  SNF    Equipment Recommendations  3 in 1 bedside comode;Tub/shower seat    Recommendations for Other Services       Precautions / Restrictions Precautions Precautions: Fall      Mobility Bed Mobility Overal bed mobility: Needs Assistance Bed Mobility: Rolling;Sidelying to Sit Rolling: Min assist Sidelying to sit: Min assist       General bed mobility comments: Min assist to complete roll to side and to elevate trunk to seated position; min verbal cues for hand placement and sequencing  Transfers Overall transfer level: Needs assistance Equipment used: 1 person hand held assist Transfers: Sit to/from Stand Sit to Stand: Min assist         General transfer comment: Min A to power up into standing and for balance     Balance Overall balance assessment: Needs assistance Sitting-balance support: Feet supported Sitting balance-Leahy Scale: Good       Standing balance-Leahy Scale: Poor                              ADL Overall ADL's : Needs assistance/impaired     Grooming: Wash/dry hands;Wash/dry face;Oral care;Brushing hair;Sitting   Upper Body Bathing: Minimal assitance;Sitting   Lower Body Bathing: Maximal assistance;Sit to/from stand   Upper Body Dressing : Minimal  assistance;Sitting   Lower Body Dressing: Maximal assistance;Sit to/from stand   Toilet Transfer: Minimal assistance;Ambulation;Comfort height toilet   Toileting- Clothing Manipulation and Hygiene: Moderate assistance;Sit to/from stand       Functional mobility during ADLs: Minimal assistance;Rolling walker General ADL Comments: Pt unable to access bil. LEs for LB ADLs      Vision                     Perception     Praxis      Pertinent Vitals/Pain Pain Assessment: 0-10 Pain Score: 8  Pain Location: abdomen Pain Descriptors / Indicators: Aching Pain Intervention(s): Monitored during session     Hand Dominance     Extremity/Trunk Assessment Upper Extremity Assessment Upper Extremity Assessment: RUE deficits/detail;LUE deficits/detail RUE Deficits / Details: Pt with tremor bil. UEs that he reports is new onset as of July.  He reports tremors intefered with ability to shave and perform grooming activities  RUE Coordination: decreased fine motor;decreased gross motor LUE Deficits / Details: Pt with tremor bil. UEs that he reports is new onset as of July.  He reports tremors intefered with ability to shave and perform grooming activities  LUE Coordination: decreased fine motor;decreased gross motor   Lower Extremity Assessment Lower Extremity Assessment: Defer to PT evaluation   Cervical / Trunk Assessment Cervical / Trunk Assessment: Normal   Communication Communication Communication: No difficulties   Cognition Arousal/Alertness: Awake/alert Behavior During Therapy: [facility]  for tasks assessed/performed Overall Cognitive Status: Within Functional Limits for tasks assessed                     General Comments       Exercises       Shoulder Instructions      Home Living Family/patient expects to be discharged to:: Private residence Living Arrangements: Alone Available Help at Discharge: Friend(s);Available PRN/intermittently Type of Home:  House Home Access: Stairs to enter Entrance Stairs-Number of Steps: 8 but landings within   Home Layout: One level     Bathroom Shower/Tub: Tub/shower unit;Curtain Shower/tub characteristics: Architectural technologist: Standard     Home Equipment: Environmental consultant - 2 wheels;Shower seat          Prior Functioning/Environment Level of Independence: Independent        Comments: Pt reports he was independent, but was "just getting by" since July.  He reports only fixing frozen or canned foods and struggling to manage day to day activities     OT Diagnosis: Generalized weakness;Acute pain   OT Problem List: Decreased strength;Decreased activity tolerance;Impaired balance (sitting and/or standing);Decreased safety awareness;Decreased knowledge of precautions;Decreased knowledge of use of DME or AE;Cardiopulmonary status limiting activity;Pain   OT Treatment/Interventions: Self-care/ADL training;DME and/or AE instruction;Therapeutic activities;Patient/family education;Balance training    OT Goals(Current goals can be found in the care plan section) Acute Rehab OT Goals Patient Stated Goal: to regain independence  OT Goal Formulation: With patient Time For Goal Achievement: 07/22/14 Potential to Achieve Goals: Good ADL Goals Pt Will Perform Grooming: with supervision;standing Pt Will Perform Lower Body Bathing: with supervision;with adaptive equipment;sit to/from stand Pt Will Perform Lower Body Dressing: with supervision;with adaptive equipment;sit to/from stand Pt Will Transfer to Toilet: with supervision;ambulating;regular height toilet;bedside commode;grab bars Pt Will Perform Toileting - Clothing Manipulation and hygiene: with supervision;sit to/from stand  OT Frequency: Min 2X/week   Barriers to D/C: Decreased caregiver support          Co-evaluation              End of Session Equipment Utilized During Treatment: Oxygen Nurse Communication: Mobility status  Activity  Tolerance: Patient tolerated treatment well Patient left: in chair;with call bell/phone within reach   Time: 1103-1594 OT Time Calculation (min): 35 min Charges:  OT General Charges $OT Visit: 1 Procedure OT Evaluation $Initial OT Evaluation Tier I: 1 Procedure OT Treatments $Self Care/Home Management : 8-22 mins $Therapeutic Activity: 8-22 mins G-Codes:    Kanasia Gayman M 07-14-2014, 4:40 PM

## 2014-07-08 NOTE — Progress Notes (Signed)
PT Cancellation Note  Patient Details Name: Fernando Carr MRN: [id number] DOB: March 13, 1941   Cancelled Treatment:    Reason Eval/Treat Not Completed: Medical issues which prohibited therapy (pt still with Lauralee Evener and not appropriate for mobility at this time)   Melford Aase 07/08/2014, 7:44 AM Elwyn Reach, [place]

## 2014-07-08 NOTE — Evaluation (Signed)
Physical Therapy Evaluation Patient Details Name: Fernando Carr MRN: [id number] DOB: 1941/03/30 Today's Date: 07/08/2014   History of Present Illness  Pt admitted for AAA repair with PMHx of bipolar, depression and suicide attempt, HLD  Clinical Impression  Patient is moving well after his surgery yesterday and is limited primarily by post-op abdominal pain.  SpO2 remained above 90% on room air throughout session.  He is requiring physical assistance with transfers and bed mobility and will need further care in order to be able to care for himself and return home.  Patient would benefit from skilled therapy to address limitations in functional mobility and to decrease pain in order to decrease burden of care and return to PLOF.        Follow Up Recommendations SNF (short term)    Equipment Recommendations  None recommended by PT    Recommendations for Other Services OT consult     Precautions / Restrictions Precautions Precautions: Fall      Mobility  Bed Mobility Overal bed mobility: Needs Assistance Bed Mobility: Rolling;Sidelying to Sit Rolling: Min assist Sidelying to sit: Min assist       General bed mobility comments: Min assist to complete roll to side and to elevate trunk to seated position; min verbal cues for hand placement and sequencing  Transfers Overall transfer level: Needs assistance Equipment used: Rolling walker (2 wheeled) Transfers: Sit to/from Stand Sit to Stand: Min assist         General transfer comment: Min assist to power up to standing; Verbal cues for hand placement, proper use of RW, and sequencing  Ambulation/Gait Ambulation/Gait assistance: Min guard Ambulation Distance (Feet): 150 Feet Assistive device: Rolling walker (2 wheeled) Gait Pattern/deviations: Step-through pattern;Decreased stride length Gait velocity: decreased Gait velocity interpretation: Below normal speed for age/gender General Gait Details: Gait speed decreased  secondary to abdominal pain; Min guard for patient safety and balance; Verbal cues for RW use, posture, and direction.  Stairs            Wheelchair Mobility    Modified Rankin (Stroke Patients Only)       Balance Overall balance assessment: Needs assistance Sitting-balance support: No upper extremity supported;Feet supported Sitting balance-Leahy Scale: Good       Standing balance-Leahy Scale: Fair                               Pertinent Vitals/Pain Pain Assessment: 0-10 Pain Score: 8  (while walking) Pain Location: abdomen Pain Intervention(s): Limited activity within patient's tolerance;Monitored during session;Repositioned    Home Living Family/patient expects to be discharged to:: Private residence Living Arrangements: Alone Available Help at Discharge: Friend(s);Available PRN/intermittently Type of Home: House Home Access: Stairs to enter   Entrance Stairs-Number of Steps: 8 but landings within Home Layout: One level Home Equipment: Walker - 2 wheels;Shower seat      Prior Function Level of Independence: Independent         Comments: Pt reports he was caring for himself but not really sure how as he wasn't doing much cooking or cleaning     Hand Dominance        Extremity/Trunk Assessment   Upper Extremity Assessment: Defer to OT evaluation           Lower Extremity Assessment: Overall WFL for tasks assessed         Communication   Communication: No difficulties  Cognition Arousal/Alertness: Awake/alert Behavior During Therapy: [facility]  for tasks assessed/performed Overall Cognitive Status: Within Functional Limits for tasks assessed                      General Comments      Exercises        Assessment/Plan    PT Assessment Patient needs continued PT services  PT Diagnosis Acute pain;Difficulty walking   PT Problem List Decreased activity tolerance;Decreased mobility;Decreased knowledge of use of  DME;Pain  PT Treatment Interventions DME instruction;Gait training;Stair training;Functional mobility training;Therapeutic activities;Balance training;Patient/family education   PT Goals (Current goals can be found in the Care Plan section) Acute Rehab PT Goals Patient Stated Goal: to get back to prior function PT Goal Formulation: With patient Time For Goal Achievement: 07/22/14 Potential to Achieve Goals: Good    Frequency Min 3X/week   Barriers to discharge Decreased caregiver support Neighbors available with uncertain frequency (intermittent at most)    Co-evaluation               End of Session   Activity Tolerance: Patient tolerated treatment well Patient left: in chair;with call bell/phone within reach Nurse Communication: Mobility status         Time: 2130-8657 PT Time Calculation (min) (ACUTE ONLY): 30 min   Charges:         PT G Codes:          Othell Diluzio SPT 07/08/2014, 3:00 PM

## 2014-07-08 NOTE — Progress Notes (Signed)
Subjective: Interval History: none.. Reports abdominal soreness controlled with pain medication. No nausea.  Objective: Vital signs in last 24 hours: Temp:  [96.6 F (35.9 C)-100.4 F (38 C)] 100 F (37.8 C) (12/08 0600) Pulse Rate:  [57-92] 63 (12/08 0600) Resp:  [10-23] 18 (12/08 0600) BP: (77-167)/(41-75) 115/49 mmHg (12/08 0600) SpO2:  [88 %-100 %] 97 % (12/08 0600) Arterial Line BP: (90-184)/(36-65) 133/43 mmHg (12/08 0600) Weight:  [217 lb (98.43 kg)] 217 lb (98.43 kg) (12/07 1600)  Intake/Output from previous day: 12/07 0701 - 12/08 0700 In: 5038.8 [I.V.:4318.8; Blood:300; NG/GT:120; IV Piggyback:300] Out: 2395 [Urine:1435; Emesis/NG output:300; Blood:660] Intake/Output this shift:    Abdomen soft with appropriate tenderness. 2+ femoral pulses and palpable dorsalis pedis pulses bilaterally  Lab Results:  Recent Labs  07/07/14 1059 07/08/14 0400  WBC 14.5* 8.8  HGB 12.0* 11.6*  HCT 36.5* 36.2*  PLT 124* 123*   BMET  Recent Labs  07/07/14 1059 07/08/14 0400  NA 140 140  K 4.1 4.6  CL 106 107  CO2 25 24  GLUCOSE 134* 130*  BUN 21 15  CREATININE 1.13 1.02  CALCIUM 9.7 9.7    Studies/Results: Dg Chest Port 1 View  07/07/2014   CLINICAL DATA:  Abdominal aortic aneurysm.  Status post repair.  EXAM: PORTABLE CHEST - 1 VIEW  COMPARISON:  04/19/2014  FINDINGS: Swan-Ganz catheter is in the right main pulmonary artery. OG tube is in the fundus of the stomach with the side hole at the GE junction. Heart size and pulmonary vascularity are normal and the lungs are clear.  There appears to be free air under the diaphragm.  IMPRESSION: No acute abnormalities of the chest. Free air in the abdomen felt to be related to the patient's surgery.   Electronically Signed   By: Rozetta Nunnery M.D.   On: 07/07/2014 12:49   Dg Abd Portable 1v  07/07/2014   CLINICAL DATA:  Abdominal aortic aneurysm.  Status post repair.  EXAM: PORTABLE ABDOMEN - 1 VIEW  COMPARISON:  Scout image for  CT scan dated 03/09/2014  FINDINGS: Normal bowel gas pattern. No acute osseous abnormality. Degenerative changes in the lower lumbar spine. No retained instruments. No visible free fluid.  IMPRESSION: Benign appearing abdomen.   Electronically Signed   By: Rozetta Nunnery M.D.   On: 07/07/2014 12:50   Anti-infectives: Anti-infectives    Start     Dose/Rate Route Frequency Ordered Stop   07/07/14 2000  cefUROXime (ZINACEF) 1.5 g in dextrose 5 % 50 mL IVPB     1.5 g100 mL/hr over 30 Minutes Intravenous Every 12 hours 07/07/14 1254 07/08/14 1959   07/06/14 1323  cefUROXime (ZINACEF) 1.5 g in dextrose 5 % 50 mL IVPB     1.5 g100 mL/hr over 30 Minutes Intravenous 30 min pre-op 07/06/14 1323 07/07/14 0733      Assessment/Plan: s/p Procedure(s): ABDOMINAL AORTIC ANEURYSM REPAIR (N/A) Stable postop day 1 from open aneurysm repair. Minimal output from NG tube. We'll discontinue NG tube. We'll also discontinue Deberah Pelton catheter and leave sleeve. Keep in ICU today and mobilized.   LOS: 1 day   EARLY, TODD 07/08/2014, 7:22 AM

## 2014-07-09 NOTE — Plan of Care (Signed)
Problem: Phase I Progression Outcomes Goal: OOB as tolerated unless otherwise ordered Outcome: Completed/Met Date Met:  07/09/14 Goal: Incision/dressings dry and intact Outcome: Completed/Met Date Met:  07/09/14 Goal: Sutures/staples intact Outcome: Not Applicable Date Met:  98/92/11 Goal: Tubes/drains patent Outcome: Not Applicable Date Met:  94/17/40 Goal: Voiding-avoid urinary catheter unless indicated Outcome: Completed/Met Date Met:  07/09/14 Goal: Vital signs/hemodynamically stable Outcome: Completed/Met Date Met:  07/09/14

## 2014-07-09 NOTE — Progress Notes (Signed)
Subjective: Interval History: none..   Objective: Vital signs in last 24 hours: Temp:  [98.5 F (36.9 C)-100.9 F (38.3 C)] 98.7 F (37.1 C) (12/09 0722) Pulse Rate:  [57-114] 63 (12/09 1000) Resp:  [16-28] 19 (12/09 1000) BP: (95-150)/(40-64) 113/49 mmHg (12/09 1000) SpO2:  [90 %-98 %] 97 % (12/09 1000) Arterial Line BP: (124-169)/(50-57) 124/50 mmHg (12/08 2100) Weight:  [217 lb 14.4 oz (98.839 kg)] 217 lb 14.4 oz (98.839 kg) (12/09 0345)  Intake/Output from previous day: 12/08 0701 - 12/09 0700 In: 3100 [I.V.:3000; IV Piggyback:100] Out: 2335 [Urine:2335] Intake/Output this shift: Total I/O In: 125 [I.V.:125] Out: 60 [Urine:60]  Abdomen soft and nontender. Dressing removed with wound healing nicely. Palpable pedal pulses  Lab Results:  Recent Labs  07/07/14 1059 07/08/14 0400  WBC 14.5* 8.8  HGB 12.0* 11.6*  HCT 36.5* 36.2*  PLT 124* 123*   BMET  Recent Labs  07/07/14 1059 07/08/14 0400  NA 140 140  K 4.1 4.6  CL 106 107  CO2 25 24  GLUCOSE 134* 130*  BUN 21 15  CREATININE 1.13 1.02  CALCIUM 9.7 9.7    Studies/Results: Dg Chest Port 1 View  07/08/2014   CLINICAL DATA:  Abdominal aortic aneurysm repair.  EXAM: PORTABLE CHEST - 1 VIEW  COMPARISON:  07/07/2014.  FINDINGS: NG tube and Swan-Ganz catheter in stable position. Heart size stable. No focal infiltrate. Stable cardiomegaly. No pulmonary venous congestion. No pleural effusion or pneumothorax. Free air to the hemidiaphragms again noted, most likely related to patient's surgery.  IMPRESSION: 1. NG tube is Swan-Ganz catheter stable position. 2. No acute cardiopulmonary disease. 3. Free air in the hemidiaphragm again noted, most likely related to patient's surgery.   Electronically Signed   By: Marcello Moores  Register   On: 07/08/2014 07:49   Dg Chest Port 1 View  07/07/2014   CLINICAL DATA:  Abdominal aortic aneurysm.  Status post repair.  EXAM: PORTABLE CHEST - 1 VIEW  COMPARISON:  04/19/2014  FINDINGS:  Swan-Ganz catheter is in the right main pulmonary artery. OG tube is in the fundus of the stomach with the side hole at the GE junction. Heart size and pulmonary vascularity are normal and the lungs are clear.  There appears to be free air under the diaphragm.  IMPRESSION: No acute abnormalities of the chest. Free air in the abdomen felt to be related to the patient's surgery.   Electronically Signed   By: Rozetta Nunnery M.D.   On: 07/07/2014 12:49   Dg Abd Portable 1v  07/07/2014   CLINICAL DATA:  Abdominal aortic aneurysm.  Status post repair.  EXAM: PORTABLE ABDOMEN - 1 VIEW  COMPARISON:  Scout image for CT scan dated 03/09/2014  FINDINGS: Normal bowel gas pattern. No acute osseous abnormality. Degenerative changes in the lower lumbar spine. No retained instruments. No visible free fluid.  IMPRESSION: Benign appearing abdomen.   Electronically Signed   By: Rozetta Nunnery M.D.   On: 07/07/2014 12:50   Anti-infectives: Anti-infectives    Start     Dose/Rate Route Frequency Ordered Stop   07/07/14 2000  cefUROXime (ZINACEF) 1.5 g in dextrose 5 % 50 mL IVPB     1.5 g100 mL/hr over 30 Minutes Intravenous Every 12 hours 07/07/14 1254 07/08/14 0921   07/06/14 1323  cefUROXime (ZINACEF) 1.5 g in dextrose 5 % 50 mL IVPB     1.5 g100 mL/hr over 30 Minutes Intravenous 30 min pre-op 07/06/14 1323 07/07/14 0733  Assessment/Plan: s/p Procedure(s): ABDOMINAL AORTIC ANEURYSM REPAIR (N/A) Stable postop day 2. Will transfer to [place] clear liquids. DC Foley a line and Swan sleeve   LOS: 2 days   Fernando Carr 07/09/2014, 6:51 PM

## 2014-07-09 NOTE — Progress Notes (Signed)
Physical Therapy Treatment Patient Details Name: Fernando Carr MRN: [id number] DOB: 1941-04-04 Today's Date: 07/09/2014    History of Present Illness Pt admitted for AAA repair with PMHx of bipolar, depression and suicide attempt, HLD    PT Comments    Patient progressing towards physical therapy goals. Ambulates with min guard assist while using a rolling walker and requires min assist with bed mobility and transfers. Tolerating therapeutic exercises well. Patient will continue to benefit from skilled physical therapy services to further improve independence with functional mobility.   Follow Up Recommendations  SNF     Equipment Recommendations  None recommended by PT    Recommendations for Other Services OT consult     Precautions / Restrictions Precautions Precautions: Fall Restrictions Weight Bearing Restrictions: No    Mobility  Bed Mobility Overal bed mobility: Needs Assistance Bed Mobility: Supine to Sit   Sidelying to sit: Min assist;HOB elevated       General bed mobility comments: Min assist for truncal support into seated position. VC for technique and use of rail for support.  Transfers Overall transfer level: Needs assistance Equipment used: Rolling walker (2 wheeled) Transfers: Sit to/from Stand Sit to Stand: Min assist         General transfer comment: Min assist for boost to stand from lowest bed setting. VC for hand placement. Good stability once holding onto RW for support.  Ambulation/Gait Ambulation/Gait assistance: Min guard Ambulation Distance (Feet): 250 Feet Assistive device: Rolling walker (2 wheeled) Gait Pattern/deviations: Step-through pattern;Decreased step length - right;Decreased stance time - left;Decreased stride length;Decreased dorsiflexion - right;Trunk flexed Gait velocity: decreased   General Gait Details: VC for upright posture throughout distance, and to increase step length on Rt. No loss of balance noted while holding  RW for stability. Cues for walker closer to base of support.   Stairs            Wheelchair Mobility    Modified Rankin (Stroke Patients Only)       Balance                                    Cognition Arousal/Alertness: Awake/alert Behavior During Therapy: WFL for tasks assessed/performed Overall Cognitive Status: Within Functional Limits for tasks assessed                      Exercises General Exercises - Lower Extremity Ankle Circles/Pumps: AROM;Both;10 reps;Seated Quad Sets: Strengthening;Both;5 reps;Seated Gluteal Sets: Strengthening;Both;Seated;5 reps Long Arc Quad: Strengthening;Both;10 reps;Seated Hip Flexion/Marching: Strengthening;Both;10 reps;Seated    General Comments General comments (skin integrity, edema, etc.): SpO2 98% on room air while ambulating HR 100      Pertinent Vitals/Pain Pain Assessment: No/denies pain Pain Intervention(s): Monitored during session    Home Living                      Prior Function            PT Goals (current goals can now be found in the care plan section) Acute Rehab PT Goals Patient Stated Goal: to regain independence  PT Goal Formulation: With patient Time For Goal Achievement: 07/22/14 Potential to Achieve Goals: Good Progress towards PT goals: Progressing toward goals    Frequency  Min 3X/week    PT Plan Current plan remains appropriate    Co-evaluation  End of Session Equipment Utilized During Treatment: Gait belt Activity Tolerance: Patient tolerated treatment well Patient left: in chair;with call bell/phone within reach;with chair alarm set     Time: 1464-3142 PT Time Calculation (min) (ACUTE ONLY): 19 min  Charges:  $Gait Training: 8-22 mins                    G Codes:      Ellouise Newer 08/03/14, 4:56 PM  Camille Bal [place], [place]

## 2014-07-10 MED ORDER — PANTOPRAZOLE SODIUM 40 MG PO TBEC
40.0000 mg | DELAYED_RELEASE_TABLET | Freq: Every day | ORAL | Status: DC
  Administered 2014-07-10 – 2014-07-11 (×2): 40 mg via ORAL
  Filled 2014-07-10: qty 1

## 2014-07-10 NOTE — Plan of Care (Signed)
Problem: Phase I Progression Outcomes Goal: Initial discharge plan identified Outcome: Completed/Met Date Met:  07/10/14 Patient to go to SNF at time of discharge  Problem: Phase II Progression Outcomes Goal: Pain controlled Outcome: Completed/Met Date Met:  07/10/14 Goal: Progress activity as tolerated unless otherwise ordered Outcome: Completed/Met Date Met:  07/10/14 Goal: Progressing with IS, TCDB Outcome: Progressing Goal: Vital signs stable Outcome: Completed/Met Date Met:  07/10/14 Goal: Surgical site without signs of infection Outcome: Completed/Met Date Met:  07/10/14 Goal: Dressings dry/intact Outcome: Not Applicable Date Met:  40/34/74 Wound is OTA Goal: Sutures/staples intact Outcome: Not Applicable Date Met:  25/95/63 Skin glue present and wound WNL Goal: Return of bowel function (flatus, BM) IF ABDOMINAL SURGERY:  Outcome: Completed/Met Date Met:  07/10/14 Large BM 12/9 Goal: Foley discontinued Outcome: Completed/Met Date Met:  07/10/14 Goal: Discharge plan established Outcome: Completed/Met Date Met:  07/10/14 Goal: Tolerating diet Outcome: Completed/Met Date Met:  07/10/14

## 2014-07-10 NOTE — Progress Notes (Signed)
Subjective: Interval History: none.. Had bowel movement. Tolerating regular diet.  Objective: Vital signs in last 24 hours: Temp:  [98.3 F (36.8 C)-99 F (37.2 C)] 98.3 F (36.8 C) (12/10 0545) Pulse Rate:  [63-125] 63 (12/10 0545) Resp:  [17-21] 17 (12/10 0545) BP: (96-166)/(40-147) 160/147 mmHg (12/10 0545) SpO2:  [92 %-98 %] 93 % (12/10 0545)  Intake/Output from previous day: 12/09 0701 - 12/10 0700 In: 125 [I.V.:125] Out: 560 [Urine:560] Intake/Output this shift:    Abdomen soft nontender  Lab Results:  Recent Labs  07/07/14 1059 07/08/14 0400  WBC 14.5* 8.8  HGB 12.0* 11.6*  HCT 36.5* 36.2*  PLT 124* 123*   BMET  Recent Labs  07/07/14 1059 07/08/14 0400  NA 140 140  K 4.1 4.6  CL 106 107  CO2 25 24  GLUCOSE 134* 130*  BUN 21 15  CREATININE 1.13 1.02  CALCIUM 9.7 9.7    Studies/Results: Dg Chest Port 1 View  07/08/2014   CLINICAL DATA:  Abdominal aortic aneurysm repair.  EXAM: PORTABLE CHEST - 1 VIEW  COMPARISON:  07/07/2014.  FINDINGS: NG tube and Swan-Ganz catheter in stable position. Heart size stable. No focal infiltrate. Stable cardiomegaly. No pulmonary venous congestion. No pleural effusion or pneumothorax. Free air to the hemidiaphragms again noted, most likely related to patient's surgery.  IMPRESSION: 1. NG tube is Swan-Ganz catheter stable position. 2. No acute cardiopulmonary disease. 3. Free air in the hemidiaphragm again noted, most likely related to patient's surgery.   Electronically Signed   By: Marcello Moores  Register   On: 07/08/2014 07:49   Dg Chest Port 1 View  07/07/2014   CLINICAL DATA:  Abdominal aortic aneurysm.  Status post repair.  EXAM: PORTABLE CHEST - 1 VIEW  COMPARISON:  04/19/2014  FINDINGS: Swan-Ganz catheter is in the right main pulmonary artery. OG tube is in the fundus of the stomach with the side hole at the GE junction. Heart size and pulmonary vascularity are normal and the lungs are clear.  There appears to be free air under  the diaphragm.  IMPRESSION: No acute abnormalities of the chest. Free air in the abdomen felt to be related to the patient's surgery.   Electronically Signed   By: Rozetta Nunnery M.D.   On: 07/07/2014 12:49   Dg Abd Portable 1v  07/07/2014   CLINICAL DATA:  Abdominal aortic aneurysm.  Status post repair.  EXAM: PORTABLE ABDOMEN - 1 VIEW  COMPARISON:  Scout image for CT scan dated 03/09/2014  FINDINGS: Normal bowel gas pattern. No acute osseous abnormality. Degenerative changes in the lower lumbar spine. No retained instruments. No visible free fluid.  IMPRESSION: Benign appearing abdomen.   Electronically Signed   By: Rozetta Nunnery M.D.   On: 07/07/2014 12:50   Anti-infectives: Anti-infectives    Start     Dose/Rate Route Frequency Ordered Stop   07/07/14 2000  cefUROXime (ZINACEF) 1.5 g in dextrose 5 % 50 mL IVPB     1.5 g100 mL/hr over 30 Minutes Intravenous Every 12 hours 07/07/14 1254 07/08/14 0921   07/06/14 1323  cefUROXime (ZINACEF) 1.5 g in dextrose 5 % 50 mL IVPB     1.5 g100 mL/hr over 30 Minutes Intravenous 30 min pre-op 07/06/14 1323 07/07/14 0733      Assessment/Plan: s/p Procedure(s): ABDOMINAL AORTIC ANEURYSM REPAIR (N/A) Postop day #3. Head of schedule. Has had return of bowel and appetite function. Nursing facility tomorrow. Does not have any support and needs intermediate skilled nursing facility prior  to discharge f to home   LOS: 3 days   Breyana Follansbee 07/10/2014, 8:38 AM

## 2014-07-10 NOTE — Progress Notes (Signed)
Utilization review completed.  

## 2014-07-10 NOTE — Clinical Social Work Psychosocial (Signed)
Clinical Social Work Department BRIEF PSYCHOSOCIAL ASSESSMENT 07/10/2014  Patient:  Fernando Carr, Fernando Carr     Account Number:  [id number]     Admit date:  07/07/2014  Clinical Social Worker:  Dian Queen  Date/Time:  07/10/2014 03:59 PM  Referred by:  Physician  Date Referred:  07/10/2014 Referred for  SNF Placement   Other Referral:   Interview type:  Patient Other interview type:    PSYCHOSOCIAL DATA Living Status:  ALONE Admitted from facility:   Level of care:   Primary support name:   Primary support relationship to patient:  NONE Degree of support available:   Patient does not have any support at home.  He does not have any family nearby.    CURRENT CONCERNS Current Concerns  Post-Acute Placement   Other Concerns:    SOCIAL WORK ASSESSMENT / PLAN Patient is a 73 year old male who lives on his own. Patient was alert and oriented x3.  Patient states he has never been in a SNF for rehab, process was explained to patient.  Patient was asked how long he would stay in rehab explained to him it depends on how well he progresses.  The goal is to have short term rehab then return back home. Patient was agreeable to going to short term rehab, once discharge orders have been provided and patient is medically ready.   Assessment/plan status:  Psychosocial Support/Ongoing Assessment of Needs Other assessment/ plan:   Information/referral to community resources:    PATIENT'S/FAMILY'S RESPONSE TO PLAN OF CARE: Patient in agreement to going to a SNF for short term rehab.   Fernando Carr. [place], MSW, [place] 07/10/2014 4:10 PM

## 2014-07-10 NOTE — Plan of Care (Signed)
Problem: Phase III Progression Outcomes Goal: IV changed to normal saline lock Outcome: Completed/Met Date Met:  07/10/14

## 2014-07-10 NOTE — Care Management Note (Addendum)
    Page 1 of 1   07/11/2014     3:48:17 PM CARE MANAGEMENT NOTE 07/11/2014  Patient:  JEKHI, BOLIN   Account Number:  [id number]  Date Initiated:  07/10/2014  Documentation initiated by:  Marvetta Gibbons  Subjective/Objective Assessment:   pt admitted s/p AAA repair     Action/Plan:   PTA pt lived at home- PT/OT evals   Anticipated DC Date:  07/11/2014   Anticipated DC Plan:  [place]  In-house referral  Clinical Social Worker      DC Planning Services  CM consult      Choice offered to / List presented to:             Status of service:  Completed, signed off Medicare Important Message given?  YES (If response is "NO", the following Medicare IM given date fields will be blank) Date Medicare IM given:  07/10/2014 Medicare IM given by:  Marvetta Gibbons Date Additional Medicare IM given:   Additional Medicare IM given by:    Discharge Disposition:  [place]  Per UR Regulation:  Reviewed for med. necessity/level of care/duration of stay  If discussed at [place] of Stay Meetings, dates discussed:    Comments:  07/10/14- 1400- Marvetta Gibbons RN, BSN [phone] PT/OT recommending STSNF- pt agreeable- CSW working on placement

## 2014-07-10 NOTE — Clinical Social Work Placement (Addendum)
Clinical Social Work Department CLINICAL SOCIAL WORK PLACEMENT NOTE 07/10/2014  Patient:  Fernando Carr, Fernando Carr  Account Number:  [id number] Admit date:  07/07/2014  Clinical Social Worker:  Daylah Sayavong, LCSWA  Date/time:  07/10/2014 04:11 PM  Clinical Social Work is seeking post-discharge placement for this patient at the following level of care:   SKILLED NURSING   (*CSW will update this form in Epic as items are completed)   07/10/2014  Patient/family provided with [place] Department of Clinical Social Work's list of facilities offering this level of care within the geographic area requested by the patient (or if unable, by the patient's family).  07/10/2014  Patient/family informed of their freedom to choose among providers that offer the needed level of care, that participate in Medicare, Medicaid or managed care program needed by the patient, have an available bed and are willing to accept the patient.  07/10/2014  Patient/family informed of [facility]' ownership interest in [facility], as well as of the fact that they are under no obligation to receive care at this facility.  PASARR submitted to EDS on 07/10/2014 PASARR number received on   FL2 transmitted to all facilities in geographic area requested by pt/family on  07/10/2014 FL2 transmitted to all facilities within larger geographic area on 07/10/2014  Patient informed that his/her managed care company has contracts with or will negotiate with  certain facilities, including the following:     Patient/family informed of bed offers received: 07/10/2014  Patient chooses bed at [facility]  Physician recommends and patient chooses bed at    Patient to be transferred to [facility]  on 07/11/2014  Patient to be transferred to facility by [facility] Patient and family notified of transfer on 07/11/2014 Name of family member notified: Patient did not have any family that he wanted CSW to call.   The  following physician request were entered in Epic:   Additional Comments:   Tresa Jolley R. [place], MSW, [place] 07/11/2014 2:03 PM

## 2014-07-10 NOTE — Progress Notes (Signed)
Occupational Therapy Treatment Patient Details Name: Fernando Carr MRN: [id number] DOB: [birth year] Today's Date: 07/10/2014    History of present illness Pt admitted for AAA repair with PMHx of bipolar, depression and suicide attempt, HLD   OT comments  Patient making slow progress towards OT goals. He participating in bathing activity at [facility], needed assistance with all parts of the task. Will continue to follow for self-care training, functional mobility.  Follow Up Recommendations  SNF    Equipment Recommendations  3 in 1 bedside comode;Tub/shower seat    Recommendations for Other Services      Precautions / Restrictions Precautions Precautions: Fall       Mobility Bed Mobility Overal bed mobility: Needs Assistance Bed Mobility: Supine to Sit   Sidelying to sit: Min assist;HOB elevated Supine to sit: Mod assist        Transfers Overall transfer level: Needs assistance   Transfers: Sit to/from Stand Sit to Stand: Min assist              Balance                                   ADL Overall ADL's : Needs assistance/impaired Eating/Feeding: Set up;Bed level   Grooming: Wash/dry hands;Wash/dry face;Set up;Sitting   Upper Body Bathing: Minimal assitance;Sitting   Lower Body Bathing: Moderate assistance;Sit to/from stand   Upper Body Dressing : Minimal assistance;Sitting   Lower Body Dressing: Maximal assistance;Sit to/from stand               Functional mobility during ADLs: Minimal assistance;Rolling walker General ADL Comments: Patient able to reach to ankles but not feet when bathing.       Vision                     Perception     Praxis      Cognition   Behavior During Therapy: WFL for tasks assessed/performed Overall Cognitive Status: Within Functional Limits for tasks assessed                       Extremity/Trunk Assessment               Exercises     Shoulder Instructions        General Comments      Pertinent Vitals/ Pain       Pain Assessment: No/denies pain Pain Intervention(s): Monitored during session  Home Living                                          Prior Functioning/Environment              Frequency Min 2X/week     Progress Toward Goals  OT Goals(current goals can now be found in the care plan section)  Progress towards OT goals: Progressing toward goals     Plan Discharge plan remains appropriate    Co-evaluation                 End of Session     Activity Tolerance Patient tolerated treatment well   Patient Left in bed;with call bell/phone within reach;with bed alarm set   Nurse Communication Mobility status        Time: 0936-1000 OT Time Calculation (min): 24 min  Charges:  OT General Charges $OT Visit: 1 Procedure OT Treatments $Self Care/Home Management : 23-37 mins  Khris Jansson A 07/10/2014, 10:38 AM

## 2014-07-11 DIAGNOSIS — I951 Orthostatic hypotension: Secondary | ICD-10-CM | POA: Diagnosis not present

## 2014-07-11 DIAGNOSIS — I959 Hypotension, unspecified: Secondary | ICD-10-CM | POA: Diagnosis not present

## 2014-07-11 DIAGNOSIS — R0602 Shortness of breath: Secondary | ICD-10-CM | POA: Diagnosis not present

## 2014-07-11 DIAGNOSIS — I129 Hypertensive chronic kidney disease with stage 1 through stage 4 chronic kidney disease, or unspecified chronic kidney disease: Secondary | ICD-10-CM | POA: Diagnosis not present

## 2014-07-11 DIAGNOSIS — I10 Essential (primary) hypertension: Secondary | ICD-10-CM | POA: Diagnosis not present

## 2014-07-11 DIAGNOSIS — F329 Major depressive disorder, single episode, unspecified: Secondary | ICD-10-CM | POA: Diagnosis not present

## 2014-07-11 DIAGNOSIS — I723 Aneurysm of iliac artery: Secondary | ICD-10-CM | POA: Diagnosis not present

## 2014-07-11 DIAGNOSIS — R001 Bradycardia, unspecified: Secondary | ICD-10-CM | POA: Diagnosis not present

## 2014-07-11 DIAGNOSIS — F319 Bipolar disorder, unspecified: Secondary | ICD-10-CM | POA: Diagnosis not present

## 2014-07-11 DIAGNOSIS — M6281 Muscle weakness (generalized): Secondary | ICD-10-CM | POA: Diagnosis not present

## 2014-07-11 DIAGNOSIS — I714 Abdominal aortic aneurysm, without rupture: Secondary | ICD-10-CM | POA: Diagnosis not present

## 2014-07-11 DIAGNOSIS — E785 Hyperlipidemia, unspecified: Secondary | ICD-10-CM | POA: Diagnosis not present

## 2014-07-11 DIAGNOSIS — R140 Abdominal distension (gaseous): Secondary | ICD-10-CM | POA: Diagnosis not present

## 2014-07-11 DIAGNOSIS — N183 Chronic kidney disease, stage 3 (moderate): Secondary | ICD-10-CM | POA: Diagnosis not present

## 2014-07-11 DIAGNOSIS — Z48812 Encounter for surgical aftercare following surgery on the circulatory system: Secondary | ICD-10-CM | POA: Diagnosis not present

## 2014-07-11 MED ORDER — OXYCODONE-ACETAMINOPHEN 5-325 MG PO TABS: 1.0000 | ORAL_TABLET | Freq: Four times a day (QID) | ORAL | Status: DC | PRN

## 2014-07-11 MED ORDER — ASPIRIN EC 81 MG PO TBEC: 81.0000 mg | DELAYED_RELEASE_TABLET | Freq: Every day | ORAL | Status: DC

## 2014-07-11 NOTE — Progress Notes (Addendum)
Report attempted at [facility], voicemail left. Sherrie Mustache 2:56 PM   Pt discharged with to [facility] rehab. Discharge instructions given to EMS  IV dc'd per, Tele dc'd per NT  All pt belongs at side Transported per EMS

## 2014-07-11 NOTE — Clinical Social Work Note (Signed)
Patient to be d/c'ed today to [facility].  Patient agreeable to plans will transport via ems RN to call report.  Patient was smiling and said thanks for the help.  Evette Cristal, MSW, [place]

## 2014-07-11 NOTE — Progress Notes (Signed)
Physical Therapy Treatment Patient Details Name: Fernando Carr MRN: [id number] DOB: May 20, 1941 Today's Date: 07/11/2014    History of Present Illness Pt admitted for AAA repair with PMHx of bipolar, depression and suicide attempt, HLD    PT Comments    O2 sats dropped to mid 17s while ambulating requiring 4L to return to 90% SpO2. At end of therapy, unable to titrate O2 down to 2L, required 4L to sustain 89% while sitting. Patient continues to require min guard-min assist for mobility. Ambulating up to 175 feet today (shorter distance than previous therapy session) with 2/4 dyspnea. Able to tolerate exercises in chair. Patient will continue to benefit from skilled physical therapy services to further improve independence with functional mobility.   Follow Up Recommendations  SNF     Equipment Recommendations  None recommended by PT    Recommendations for Other Services OT consult     Precautions / Restrictions Precautions Precautions: Fall Restrictions Weight Bearing Restrictions: No    Mobility  Bed Mobility Overal bed mobility: Needs Assistance Bed Mobility: Supine to Sit   Sidelying to sit: Min assist;HOB elevated       General bed mobility comments: Very slight assist for truncal control into seated position on edge of bed. Able to scoot hips forward without assist.  Transfers Overall transfer level: Needs assistance Equipment used: Rolling walker (2 wheeled) Transfers: Sit to/from Stand Sit to Stand: Min guard         General transfer comment: Close guard for safety. VC for hand placement. Good control with descent into chair. Performed from lowest bed setting.  Ambulation/Gait Ambulation/Gait assistance: Min guard Ambulation Distance (Feet): 175 Feet Assistive device: Rolling walker (2 wheeled) Gait Pattern/deviations: Step-to pattern;Step-through pattern;Decreased step length - right;Decreased stance time - right;Decreased stride length;Shuffle;Trunk  flexed;Narrow base of support Gait velocity: decreased Gait velocity interpretation: Below normal speed for age/gender General Gait Details: Continues to require frequent cues for upright posture and to increase right step length for step-through gait pattern. Fairly rigid, resembling parkinson's type gait. 2/4 dyspnea during bout; supplemental O2 had to be increased to 4L from 2L to maintain SpO2  88%-90% respectively.   Stairs            Wheelchair Mobility    Modified Rankin (Stroke Patients Only)       Balance                                    Cognition Arousal/Alertness: Awake/alert Behavior During Therapy: WFL for tasks assessed/performed Overall Cognitive Status: Impaired/Different from baseline Area of Impairment: Following commands       Following Commands: Follows one step commands with increased time            Exercises General Exercises - Lower Extremity Ankle Circles/Pumps: AROM;Both;10 reps;Seated Quad Sets: Strengthening;Both;Seated;10 reps Gluteal Sets: Strengthening;Both;Seated;10 reps Heel Slides: Strengthening;Both;10 reps;Seated    General Comments General comments (skin integrity, edema, etc.): O2 sats 90% on 2L at rest, dropped to mid 80s while ambulating requiring 4L to return to 90% SpO2. At end of therapy, unable to titrate O2 down to 2L, required 4L to sustain 89% while sitting. HR was 70s-80s throughout.      Pertinent Vitals/Pain Pain Assessment: No/denies pain Pain Intervention(s): Monitored during session    Home Living                      Prior  Function            PT Goals (current goals can now be found in the care plan section) Acute Rehab PT Goals PT Goal Formulation: With patient Time For Goal Achievement: 07/22/14 Potential to Achieve Goals: Good Progress towards PT goals: Progressing toward goals    Frequency  Min 3X/week    PT Plan Current plan remains appropriate    Co-evaluation              End of Session Equipment Utilized During Treatment: Gait belt;Oxygen Activity Tolerance: Other (comment) (Limited by declining O2 sats) Patient left: in chair;with call bell/phone within reach;with chair alarm set     Time: [phone] PT Time Calculation (min) (ACUTE ONLY): 32 min  Charges:  $Gait Training: 8-22 mins $Therapeutic Exercise: 8-22 mins                    G Codes:      Ellouise Newer 07/20/2014, 9:56 AM  Elayne Snare, [place]

## 2014-07-11 NOTE — Clinical Social Work Note (Signed)
Dr. Donnetta Hutching returned page regarding FL2 and 30 day Pass[place] note that needs to be signed in order for patient to be discharged to SNF.  Dr. Donnetta Hutching said he is out of office but will have one of his partners sign as soon as possible.  Jones Broom. [place], MSW, [place] 07/11/2014 9:40 AM

## 2014-07-11 NOTE — Progress Notes (Signed)
Subjective: Interval History: none.. Comfortable.  Still with soreness  Objective: Vital signs in last 24 hours: Temp:  [98.6 F (37 C)-99.1 F (37.3 C)] 98.6 F (37 C) (12/11 0605) Pulse Rate:  [52-90] 90 (12/11 0605) Resp:  [18-20] 20 (12/11 0605) BP: (112-157)/(60-71) 157/65 mmHg (12/11 0605) SpO2:  [91 %-94 %] 91 % (12/11 0605) Weight:  [215 lb 1.6 oz (97.569 kg)] 215 lb 1.6 oz (97.569 kg) (12/11 0605)  Intake/Output from previous day: 12/10 0701 - 12/11 0700 In: 720 [P.O.:720] Out: 450 [Urine:450] Intake/Output this shift: Total I/O In: 240 [P.O.:240] Out: -   Abd soft, non-tender.  Wd healing.  Lab Results: No results for input(s): WBC, HGB, HCT, PLT in the last 72 hours. BMET No results for input(s): NA, K, CL, CO2, GLUCOSE, BUN, CREATININE, CALCIUM in the last 72 hours.  Studies/Results: Dg Chest Port 1 View  07/08/2014   CLINICAL DATA:  Abdominal aortic aneurysm repair.  EXAM: PORTABLE CHEST - 1 VIEW  COMPARISON:  07/07/2014.  FINDINGS: NG tube and Swan-Ganz catheter in stable position. Heart size stable. No focal infiltrate. Stable cardiomegaly. No pulmonary venous congestion. No pleural effusion or pneumothorax. Free air to the hemidiaphragms again noted, most likely related to patient's surgery.  IMPRESSION: 1. NG tube is Swan-Ganz catheter stable position. 2. No acute cardiopulmonary disease. 3. Free air in the hemidiaphragm again noted, most likely related to patient's surgery.   Electronically Signed   By: Marcello Moores  Register   On: 07/08/2014 07:49   Dg Chest Port 1 View  07/07/2014   CLINICAL DATA:  Abdominal aortic aneurysm.  Status post repair.  EXAM: PORTABLE CHEST - 1 VIEW  COMPARISON:  04/19/2014  FINDINGS: Swan-Ganz catheter is in the right main pulmonary artery. OG tube is in the fundus of the stomach with the side hole at the GE junction. Heart size and pulmonary vascularity are normal and the lungs are clear.  There appears to be free air under the diaphragm.   IMPRESSION: No acute abnormalities of the chest. Free air in the abdomen felt to be related to the patient's surgery.   Electronically Signed   By: Rozetta Nunnery M.D.   On: 07/07/2014 12:49   Dg Abd Portable 1v  07/07/2014   CLINICAL DATA:  Abdominal aortic aneurysm.  Status post repair.  EXAM: PORTABLE ABDOMEN - 1 VIEW  COMPARISON:  [place] image for CT scan dated 03/09/2014  FINDINGS: Normal bowel gas pattern. No acute osseous abnormality. Degenerative changes in the lower lumbar spine. No retained instruments. No visible free fluid.  IMPRESSION: Benign appearing abdomen.   Electronically Signed   By: Rozetta Nunnery M.D.   On: 07/07/2014 12:50   Anti-infectives: Anti-infectives    Start     Dose/Rate Route Frequency Ordered Stop   07/07/14 2000  cefUROXime (ZINACEF) 1.5 g in dextrose 5 % 50 mL IVPB     1.5 g100 mL/hr over 30 Minutes Intravenous Every 12 hours 07/07/14 1254 07/08/14 0921   07/06/14 1323  cefUROXime (ZINACEF) 1.5 g in dextrose 5 % 50 mL IVPB     1.5 g100 mL/hr over 30 Minutes Intravenous 30 min pre-op 07/06/14 1323 07/07/14 0733      Assessment/Plan: s/p Procedure(s): ABDOMINAL AORTIC ANEURYSM REPAIR (N/A) Doing well POD 4.  OK for transfer to Rehab/SNF when bed available   LOS: 4 days   Gerianne Simonet 07/11/2014, 9:39 AM

## 2014-07-11 NOTE — Discharge Summary (Signed)
[facility] AAA Discharge Summary  Fernando Carr Jul 21, 1941 73 y.o. male  [id number]  Admission Date: 07/07/2014  Discharge Date: 07/11/2014  Physician: Rosetta Posner, MD  Admission Diagnosis: Abdominal Aortic Aneurysm I71.4   HPI:   This is a 73 y.o. male who was seen by Dr. 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 He had undergone noninvasive cardiac nucleotide study in this shows a low risk from a cardiac standpoint. He does not have any family support in the area and is concerned regarding this. Dr. Donnetta Hutching explained that the nurse case management would be involved and that if necessary he would go to an intermediate skilled nursing facility prior to discharge to home.  Hospital Course:  The patient was admitted to the hospital and taken to the operating room on 07/07/2014 and underwent: Resection and grafting of abdominal aortic aneurysm. Repair with a 18 x 9 Hemashield aorta and bilateral common iliac arteries.    The patient tolerated the procedure well and was transported to the PACU in stable condition. He was transferred to the surgical ICU.   On POD 1, he was doing well. He had minimal output from his NG tube and it was discontinued. He had 2+ femoral and palpable dorsalis pedis pulses bilaterally.   He was advanced to clear liquids on POD 2 and was transferred to the floor. His foley was discontinued.   He was able to tolerate to a regular diet on POD 3. He was continuing to progress well and was stable. Social work was consulted for SNF placement.   On POD 4, his incision was healing well with palpable distal pulses. He was tolerating a regular diet well and pain adequately controlled. He was discharged to SNF in good condition.   CBC    Component Value Date/Time   WBC 8.8 07/08/2014 0400   RBC 4.02* 07/08/2014 0400   HGB 11.6* 07/08/2014 0400   HCT 36.2* 07/08/2014 0400   PLT 123* 07/08/2014 0400   MCV  90.0 07/08/2014 0400   MCH 28.9 07/08/2014 0400   MCHC 32.0 07/08/2014 0400   RDW 13.2 07/08/2014 0400   LYMPHSABS 1.1 04/19/2014 1427   MONOABS 0.9 04/19/2014 1427   EOSABS 0.0 04/19/2014 1427   BASOSABS 0.0 04/19/2014 1427    BMET    Component Value Date/Time   NA 140 07/08/2014 0400   K 4.6 07/08/2014 0400   CL 107 07/08/2014 0400   CO2 24 07/08/2014 0400   GLUCOSE 130* 07/08/2014 0400   BUN 15 07/08/2014 0400   CREATININE 1.02 07/08/2014 0400   CREATININE 1.21 07/20/2012 1025   CALCIUM 9.7 07/08/2014 0400   GFRNONAA 71* 07/08/2014 0400   GFRAA 82* 07/08/2014 0400     Discharge Instructions:   The patient is discharged to SNF with extensive instructions on wound care and progressive ambulation.  They are instructed not to drive or perform any heavy lifting until returning to see the physician in his office.  Discharge Instructions    ABDOMINAL PROCEDURE/ANEURYSM REPAIR/AORTO-BIFEMORAL BYPASS:  Call MD for increased abdominal pain; cramping diarrhea; nausea/vomiting    Complete by:  As directed      Call MD for:  redness, tenderness, or signs of infection (pain, swelling, bleeding, redness, odor or green/yellow discharge around incision site)    Complete by:  As directed      Call MD for:  severe or increased pain, loss or decreased feeling  in affected limb(s)  Complete by:  As directed      Call MD for:  temperature >100.5    Complete by:  As directed      Driving Restrictions    Complete by:  As directed   No driving for 2 weeks and while on pain medication     Increase activity slowly    Complete by:  As directed   Walk with assistance use walker or cane as needed     Lifting restrictions    Complete by:  As directed   No lifting greater than a gallon of milk for 4 weeks.     Resume previous diet    Complete by:  As directed      may wash over wound with mild soap and water    Complete by:  As directed            Discharge Diagnosis:  Abdominal Aortic  Aneurysm I71.4  Secondary Diagnosis: Patient Active Problem List   Diagnosis Date Noted  . AAA (abdominal aortic aneurysm) 07/07/2014  . AKI (acute kidney injury) 03/10/2014  . Dehydration 03/10/2014  . FTT (failure to thrive) in adult 03/10/2014  . 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 Sinus bradycardia 03/10/2014  . HTN (hypertension) 03/10/2014  . Hypotension, postural 03/09/2014  . CRD (chronic renal disease), stage III 01/09/2014  . Abdominal aneurysm without mention of rupture 12/20/2011  . Bipolar 1 disorder 10/24/2011  . Hyperlipemia 10/10/2011   Past Medical History  Diagnosis Date  . High cholesterol   . Bipolar disorder   . AAA (abdominal aortic aneurysm)   . Depression   . Renal insufficiency     chronic renal   . Suicide attempt aug. 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 AAA (abdominal aortic aneurysm)        Medication List    TAKE these medications        aspirin EC 81 MG tablet  Take 1 tablet (81 mg total) by mouth daily.     escitalopram 20 MG tablet  Commonly known as:  LEXAPRO  Take 1 tablet (20 mg total) by mouth daily.     fenofibrate micronized 67 MG capsule  Commonly known as:  LOFIBRA  Take 67 mg by mouth daily.     fludrocortisone 0.1 MG tablet  Commonly known as:  FLORINEF  Take 1 tablet (0.1 mg total) by mouth daily.     multivitamin with minerals tablet  Take 1 tablet by mouth daily.     oxyCODONE-acetaminophen 5-325 MG per tablet  Commonly known as:  PERCOCET/ROXICET  Take 1-2 tablets by mouth every 6 (six) hours as needed for moderate pain.     risperiDONE 1 MG tablet  Commonly known as:  RISPERDAL  Take 1 tablet (1 mg total) by mouth at bedtime.     simvastatin 20 MG tablet  Commonly known as:  ZOCOR  Take 10 mg by mouth daily.        Percocet #30 No Refill  Disposition: SNF  Patient's condition: is Good  Follow up: 1. Dr. Donnetta Hutching in 2 weeks   Virgina Jock, PA-C [facility] [phone] 07/11/2014  12:24 PM   - For VQI Registry use ---   Post-op:  Time to Extubation: [x ] In OR, [ ]  < 12 hrs, [ ]  12-24 hrs, [ ]  >=24 hrs Vasopressors Req. Post-op: No ICU Stay: 2 days Transfusion: No   MI: No, [ ]   Troponin only, [ ]  EKG or Clinical New Arrhythmia: No  Complications: CHF: No Resp failure: No, [ ]  Pneumonia, [ ]  Ventilator Chg in renal function: No, [ ]  Inc. Cr > 0.5, [ ]  Temp. Dialysis, [ ]  Permanent dialysis Leg ischemia: No, no Surgery needed, [ ]  Yes, Surgery needed, [ ]  Amputation Bowel ischemia: No, [ ]  Medical Rx, [ ]  Surgical Rx Wound complication: No, [ ]  Superficial separation/infection, [ ]  Return to OR Return to OR: No  Return to OR for bleeding: No Stroke: No, [ ]  Minor, [ ]  Major  Discharge medications: Statin use:  Yes If No: [ ]  For Medical reasons, [ ]  Non-compliant ASA use:  Yes  If No: [ ]  For Medical reasons, [ ]  Non-compliant Plavix use:  No If No: [ ]  For Medical reasons, [ ]  Non-compliant Beta blocker use:  No If No: [ ]  For Medical reasons, [ ]  Non-compliant

## 2014-07-11 NOTE — Progress Notes (Addendum)
Dr. Donnetta Hutching paged, pt needs PSAR signed for bed placement. Sherrie Mustache 9:20 AM   Paged returned, MD not on site, will have paperwork filled by other member of his team as soon as possible. 9:29 AM

## 2014-07-11 NOTE — Clinical Social Work Note (Signed)
Spoke with patient in regards to bed offers.  Patient asked about [facility] and [facility], informed him that if he wants to speak to someone from the facility, CSW can contact them for him.  Informed patient that both facilities were offering private room, and gave him the location of facilities, patient said he would like to speak with someone from [facility].  Contacted [facility], and admissions coordinator who said she will come meet with patient, informed patient coordinator will be visiting him.  Jones Broom. [place], MSW, [place] 07/11/2014 12:00 PM

## 2014-07-15 ENCOUNTER — Ambulatory Visit (HOSPITAL_COMMUNITY): Payer: Self-pay | Admitting: Psychiatry

## 2014-07-16 ENCOUNTER — Telehealth: Payer: Self-pay | Admitting: Vascular Surgery

## 2014-07-16 ENCOUNTER — Non-Acute Institutional Stay (SKILLED_NURSING_FACILITY): Payer: Medicare Other | Admitting: Internal Medicine

## 2014-07-16 DIAGNOSIS — I714 Abdominal aortic aneurysm, without rupture, unspecified: Secondary | ICD-10-CM

## 2014-07-16 DIAGNOSIS — I723 Aneurysm of iliac artery: Secondary | ICD-10-CM | POA: Diagnosis not present

## 2014-07-16 DIAGNOSIS — F319 Bipolar disorder, unspecified: Secondary | ICD-10-CM

## 2014-07-16 DIAGNOSIS — N183 Chronic kidney disease, stage 3 unspecified: Secondary | ICD-10-CM

## 2014-07-16 DIAGNOSIS — E785 Hyperlipidemia, unspecified: Secondary | ICD-10-CM

## 2014-07-16 DIAGNOSIS — I951 Orthostatic hypotension: Secondary | ICD-10-CM

## 2014-07-16 NOTE — Telephone Encounter (Addendum)
-----   Message from Sherrye Payor, RN sent at 07/11/2014  1:15 PM EST ----- Regarding: Zigmund Daniel log; needs 2 wk. f/u with Dr. Donnetta Hutching   ----- Message -----    From: Alvia [place], PA-C    Sent: 07/11/2014  12:10 PM      To: [facility] Charge Pool  S/p open AAA repair 07/07/14  F/u with Dr. Donnetta Hutching in 2 weeks.  Thanks Kim   07/16/14: lm for pt re appt, dpm

## 2014-07-16 NOTE — Progress Notes (Signed)
MRN: [id number] Name: Fernando Carr  Sex: male Age: 73 y.o. DOB: 05/05/1941  [place] #: [facility] Facility/Room:111 Level Of Care: SNF Provider: Inocencio Homes D Emergency Contacts: Extended Emergency Contact Information Primary Emergency Contact: Cristopher [place], [place]ito [place] of [place] Phone: [phone] Relation: Sister Secondary Emergency Contact: Ferd Hibbs [place] Mobile Phone: [phone] Relation: Daughter   Allergies: Review of patient's allergies indicates no known allergies.  Chief Complaint  Patient presents with  . New Admit To SNF    HPI: Patient is 73 y.o. male who is admitted to SNF for POT/PT after AAA repair.  Past Medical History  Diagnosis Date  . High cholesterol   . Bipolar disorder   . AAA (abdominal aortic aneurysm)   . Depression   . Renal insufficiency     chronic renal   . Suicide attempt aug. 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 AAA (abdominal aortic aneurysm)     Past Surgical History  Procedure Laterality Date  . Knee arthroscopy Left 1972  . Abdominal aortic aneurysm repair N/A 07/07/2014    Procedure: ABDOMINAL AORTIC ANEURYSM REPAIR;  Surgeon: Rosetta Posner, MD;  Location: [facility] OR;  Service: Vascular;  Laterality: N/A;      Medication List       This list is accurate as of: 07/16/14 11:59 PM.  Always use your most recent med list.               aspirin EC 81 MG tablet  Take 1 tablet (81 mg total) by mouth daily.     escitalopram 20 MG tablet  Commonly known as:  LEXAPRO  Take 1 tablet (20 mg total) by mouth daily.     fenofibrate micronized 67 MG capsule  Commonly known as:  LOFIBRA  Take 67 mg by mouth daily.     fludrocortisone 0.1 MG tablet  Commonly known as:  FLORINEF  Take 1 tablet (0.1 mg total) by mouth daily.     multivitamin with minerals tablet  Take 1 tablet by mouth daily.     oxyCODONE-acetaminophen 5-325 MG per tablet  Commonly known  as:  PERCOCET/ROXICET  Take 1-2 tablets by mouth every 6 (six) hours as needed for moderate pain.     risperiDONE 1 MG tablet  Commonly known as:  RISPERDAL  Take 1 tablet (1 mg total) by mouth at bedtime.     simvastatin 20 MG tablet  Commonly known as:  ZOCOR  Take 10 mg by mouth daily.        No orders of the defined types were placed in this encounter.     There is no immunization history on file for this patient.  History  Substance Use Topics  . Smoking status: Former Smoker -- 1.00 packs/day for 40 years    Types: Cigarettes    Quit date: 03/11/2014  . Smokeless tobacco: Never Used  . Alcohol Use: No    Family history is noncontributory    Review of Systems  DATA OBTAINED: from patient; no c/o GENERAL:  no fevers, fatigue, appetite changes SKIN: No itching, rash or wounds EYES: No eye pain, redness, discharge EARS: No earache, tinnitus, change in hearing NOSE: No congestion, drainage or bleeding  MOUTH/THROAT: No mouth or tooth pain, No sore throat RESPIRATORY: No cough, wheezing, SOB CARDIAC: No chest pain, palpitations, lower extremity edema  GI: No abdominal pain, No N/V/D or  constipation, No heartburn or reflux  GU: No dysuria, frequency or urgency, or incontinence  MUSCULOSKELETAL: No unrelieved bone/joint pain NEUROLOGIC: No headache, dizziness or focal weakness PSYCHIATRIC: No overt anxiety or sadness, No behavior issue.   Filed Vitals:   07/17/14 2146  BP: 143/67  Pulse: 68  Temp: 97.2 F (36.2 C)  Resp: 20    Physical Exam  GENERAL APPEARANCE: Alert, conversant,  No acute distress;looks really good to be so soon from abdominal and vascular surgery SKIN: No diaphoresis rash HEAD: Normocephalic, atraumatic  EYES: Conjunctiva/lids clear. Pupils round, reactive. EOMs intact.  EARS: External exam WNL, canals clear. Hearing grossly normal.  NOSE: No deformity or discharge.  MOUTH/THROAT: Lips w/o lesions  RESPIRATORY: Breathing is even,  unlabored. Lung sounds are clear   CARDIOVASCULAR: Heart RRR no murmurs, rubs or gallops. No peripheral edema.   GASTROINTESTINAL: Abdomen is soft, non-tender, not distended w/ normal bowel sounds; incision healing without problems GENITOURINARY: Bladder non tender, not distended  MUSCULOSKELETAL: No abnormal joints or musculature NEUROLOGIC:  Cranial nerves 2-12 grossly intact. Moves all extremities  PSYCHIATRIC: Mood and affect appropriate to situation, no behavioral issues  Patient Active Problem List   Diagnosis Date Noted  . AAA (abdominal aortic aneurysm) 07/07/2014  . AKI (acute kidney injury) 03/10/2014  . Dehydration 03/10/2014  . FTT (failure to thrive) in adult 03/10/2014  . 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 Sinus bradycardia 03/10/2014  . HTN (hypertension) 03/10/2014  . Hypotension, postural 03/09/2014  . CRD (chronic renal disease), stage III 01/09/2014  . Abdominal aortic aneurysm 12/20/2011  . Bipolar 1 disorder 10/24/2011  . Hyperlipemia 10/10/2011    CBC    Component Value Date/Time   WBC 8.8 07/08/2014 0400   RBC 4.02* 07/08/2014 0400   HGB 11.6* 07/08/2014 0400   HCT 36.2* 07/08/2014 0400   PLT 123* 07/08/2014 0400   MCV 90.0 07/08/2014 0400   LYMPHSABS 1.1 04/19/2014 1427   MONOABS 0.9 04/19/2014 1427   EOSABS 0.0 04/19/2014 1427   BASOSABS 0.0 04/19/2014 1427    CMP     Component Value Date/Time   NA 140 07/08/2014 0400   K 4.6 07/08/2014 0400   CL 107 07/08/2014 0400   CO2 24 07/08/2014 0400   GLUCOSE 130* 07/08/2014 0400   BUN 15 07/08/2014 0400   CREATININE 1.02 07/08/2014 0400   CREATININE 1.21 07/20/2012 1025   CALCIUM 9.7 07/08/2014 0400   PROT 5.6* 07/08/2014 0400   ALBUMIN 2.8* 07/08/2014 0400   AST 13 07/08/2014 0400   ALT 8 07/08/2014 0400   ALKPHOS 71 07/08/2014 0400   BILITOT 0.4 07/08/2014 0400   GFRNONAA 71* 07/08/2014 0400   GFRAA 82* 07/08/2014 0400    Assessment and Plan  Abdominal aortic  aneurysm Resection and grafting of abdominal aortic aneurysm. Repair with a 18 x 9 Hemashield aorta and bilateral common iliac arteries.   Aneurysm of right iliac artery Repaired along with AAA repair  Hypotension, postural Florinef to continue  Hyperlipemia Continue fenofibrate and zocor  CRD (chronic renal disease), stage III Chronic and stable with GFR 71, Cr 1.02  Bipolar 1 disorder Continue risperdal and lexapro    Hennie Duos, MD

## 2014-07-17 ENCOUNTER — Encounter: Payer: Self-pay | Admitting: Internal Medicine

## 2014-07-17 NOTE — Assessment & Plan Note (Signed)
Continue fenofibrate and zocor 

## 2014-07-17 NOTE — Assessment & Plan Note (Signed)
Resection and grafting of abdominal aortic aneurysm. Repair with a 18 x 9 Hemashield aorta and bilateral common iliac arteries.

## 2014-07-17 NOTE — Assessment & Plan Note (Signed)
Continue risperdal and lexapro

## 2014-07-17 NOTE — Assessment & Plan Note (Signed)
Florinef to continue

## 2014-07-17 NOTE — Assessment & Plan Note (Signed)
Chronic and stable with GFR 71, Cr 1.02

## 2014-07-17 NOTE — Assessment & Plan Note (Signed)
Repaired along with AAA repair

## 2014-07-28 ENCOUNTER — Non-Acute Institutional Stay (SKILLED_NURSING_FACILITY): Payer: Medicare Other | Admitting: Internal Medicine

## 2014-07-28 ENCOUNTER — Encounter: Payer: Self-pay | Admitting: Internal Medicine

## 2014-07-28 DIAGNOSIS — I951 Orthostatic hypotension: Secondary | ICD-10-CM

## 2014-07-28 DIAGNOSIS — N183 Chronic kidney disease, stage 3 unspecified: Secondary | ICD-10-CM

## 2014-07-28 DIAGNOSIS — I10 Essential (primary) hypertension: Secondary | ICD-10-CM

## 2014-07-28 DIAGNOSIS — I723 Aneurysm of iliac artery: Secondary | ICD-10-CM | POA: Diagnosis not present

## 2014-07-28 DIAGNOSIS — F319 Bipolar disorder, unspecified: Secondary | ICD-10-CM | POA: Diagnosis not present

## 2014-07-28 DIAGNOSIS — I714 Abdominal aortic aneurysm, without rupture, unspecified: Secondary | ICD-10-CM

## 2014-07-28 DIAGNOSIS — E785 Hyperlipidemia, unspecified: Secondary | ICD-10-CM | POA: Diagnosis not present

## 2014-07-28 NOTE — Progress Notes (Signed)
MRN: [id number] Name: Fernando Carr  Sex: male Age: 73 y.o. DOB: [birth year]  [place] #: [facility] Facility/Room:111 Level Of Care: SNF Provider: Inocencio Homes D Emergency Contacts: Extended Emergency Contact Information Primary Emergency Contact: Cristopher [place], Vito [place] of [place] Phone: [phone] Relation: Sister Secondary Emergency Contact: Ferd Hibbs [place] of [place] Mobile Phone: [phone] Relation: Daughter  Code Status: FULL  Allergies: Review of patient's allergies indicates no known allergies.  Chief Complaint  Patient presents with  . Discharge Note    HPI: Patient is 73 y.o. male who was admitted to SNF for OT/PT after AAA repair who is now ready to go home.  Past Medical History  Diagnosis Date  . High cholesterol   . Bipolar disorder   . AAA (abdominal aortic aneurysm)   . Depression   . Renal insufficiency     chronic renal   . Suicide attempt aug. 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 AAA (abdominal aortic aneurysm)     Past Surgical History  Procedure Laterality Date  . Knee arthroscopy Left 1972  . Abdominal aortic aneurysm repair N/A 07/07/2014    Procedure: ABDOMINAL AORTIC ANEURYSM REPAIR;  Surgeon: Rosetta Posner, MD;  Location: [facility];  Service: Vascular;  Laterality: N/A;      Medication List       This list is accurate as of: 07/28/14  2:19 PM.  Always use your most recent med list.               aspirin EC 81 MG tablet  Take 1 tablet (81 mg total) by mouth daily.     escitalopram 20 MG tablet  Commonly known as:  LEXAPRO  Take 1 tablet (20 mg total) by mouth daily.     fenofibrate micronized 67 MG capsule  Commonly known as:  LOFIBRA  Take 67 mg by mouth daily.     fludrocortisone 0.1 MG tablet  Commonly known as:  FLORINEF  Take 1 tablet (0.1 mg total) by mouth daily.     multivitamin with minerals tablet  Take 1 tablet by mouth daily.     oxyCODONE-acetaminophen 5-325 MG per tablet  Commonly known as:  PERCOCET/ROXICET  Take 1-2 tablets by mouth every 6 (six) hours as needed for moderate pain.     risperiDONE 1 MG tablet  Commonly known as:  RISPERDAL  Take 1 tablet (1 mg total) by mouth at bedtime.     simvastatin 20 MG tablet  Commonly known as:  ZOCOR  Take 10 mg by mouth daily.        No orders of the defined types were placed in this encounter.     There is no immunization history on file for this patient.  History  Substance Use Topics  . Smoking status: Former Smoker -- 1.00 packs/day for 40 years    Types: Cigarettes    Quit date: 03/11/2014  . Smokeless tobacco: Never Used  . Alcohol Use: No    Filed Vitals:   07/28/14 1407  BP: 130/70  Pulse: 73  Temp: 99.5 F (37.5 C)  Resp: 20    Physical Exam  GENERAL APPEARANCE: Alert, conversant. No acute distress.  HEENT: Unremarkable. RESPIRATORY: Breathing is even, unlabored. Lung sounds are clear   CARDIOVASCULAR: Heart RRR no murmurs, rubs or gallops. No peripheral edema.  GASTROINTESTINAL: Abdomen is soft, non-tender, not distended w/ normal bowel sounds;midline scar.  NEUROLOGIC: Cranial nerves 2-12 grossly intact. Moves all extremities  Patient Active Problem List   Diagnosis Date Noted  . AAA (abdominal aortic aneurysm) 07/07/2014  . AKI (acute kidney injury) 03/10/2014  . Dehydration 03/10/2014  . FTT (failure to thrive) in adult 03/10/2014  . 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 Sinus bradycardia 03/10/2014  . HTN (hypertension) 03/10/2014  . Hypotension, postural 03/09/2014  . CRD (chronic renal disease), stage III 01/09/2014  . Abdominal aortic aneurysm 12/20/2011  . Bipolar 1 disorder 10/24/2011  . Hyperlipemia 10/10/2011    CBC    Component Value Date/Time   WBC 8.8 07/08/2014 0400   RBC 4.02* 07/08/2014 0400   HGB 11.6* 07/08/2014 0400   HCT 36.2* 07/08/2014 0400   PLT 123* 07/08/2014 0400   MCV 90.0  07/08/2014 0400   LYMPHSABS 1.1 04/19/2014 1427   MONOABS 0.9 04/19/2014 1427   EOSABS 0.0 04/19/2014 1427   BASOSABS 0.0 04/19/2014 1427    CMP     Component Value Date/Time   NA 140 07/08/2014 0400   K 4.6 07/08/2014 0400   CL 107 07/08/2014 0400   CO2 24 07/08/2014 0400   GLUCOSE 130* 07/08/2014 0400   BUN 15 07/08/2014 0400   CREATININE 1.02 07/08/2014 0400   CREATININE 1.21 07/20/2012 1025   CALCIUM 9.7 07/08/2014 0400   PROT 5.6* 07/08/2014 0400   ALBUMIN 2.8* 07/08/2014 0400   AST 13 07/08/2014 0400   ALT 8 07/08/2014 0400   ALKPHOS 71 07/08/2014 0400   BILITOT 0.4 07/08/2014 0400   GFRNONAA 71* 07/08/2014 0400   GFRAA 82* 07/08/2014 0400    Assessment and Plan  Pt is stable for d/c to home with HH/OT/PT and 3:1 bedside commode and walker.  Hennie Duos, MD

## 2014-07-29 ENCOUNTER — Encounter: Payer: Self-pay | Admitting: Vascular Surgery

## 2014-08-05 ENCOUNTER — Encounter: Payer: Self-pay | Admitting: Vascular Surgery

## 2014-08-05 ENCOUNTER — Ambulatory Visit (INDEPENDENT_AMBULATORY_CARE_PROVIDER_SITE_OTHER): Payer: Self-pay | Admitting: Vascular Surgery

## 2014-08-05 VITALS — BP 128/66 | HR 62 | Resp 18 | Ht 71.0 in | Wt 203.5 lb

## 2014-08-05 DIAGNOSIS — Z8679 Personal history of other diseases of the circulatory system: Secondary | ICD-10-CM

## 2014-08-05 DIAGNOSIS — Z9889 Other specified postprocedural states: Principal | ICD-10-CM

## 2014-08-05 NOTE — Progress Notes (Signed)
    Established Open AAA Repair   History of Present Illness  Fernando Carr is a 74 y.o. (1940/09/08) male who for one month follow up s/p open AAA repair on 07/07/2014. He had an uncomplicated post-operative course and was discharged on POD 4. He was discharged to a skilled nursing facility for deconditioning because the patient has no family in town. He reports staying in the skilled nursing facility until New Years Eve.   Today, he reports that he doing well and back to his baseline physically. His prior abdominal soreness post-operatively has now resolved. He denies any issue with eating and states that he is always hungry. He has lost 15 lbs since his surgery.  Physical Examination  Filed Vitals:   08/05/14 1600  BP: 128/66  Pulse: 62  Resp: 18  Height: 5\' 11"  (1.803 m)  Weight: 203 lb 8 oz (92.307 kg)   Body mass index is 28.4 kg/(m^2).  General: A&O x 3, in NAD  Pulmonary: Sym exp, good air movt, CTAB  Cardiac: RRR, Nl S1, S2  Vascular: palpable dorsalis pedis pulses bilaterally.   Gastrointestinal: soft, NTND, midline abdominal incision nearly healed.   Medical Decision Making  Fernando Carr is a 74 y.o. male who presents s/p open AAA repair 07/07/14.   He is doing well post-operatively and has since returned to his previous activity level following three weeks in a skilled nursing facility. His abdominal incision has nearly healed and he denies any abdominal pain or issues with eating. He has palpable dorsalis pedis pulses bilaterally. He will follow up in 3 months.   Virgina Jock, PA-C [facility] Office: [phone] Pager: [phone]  08/05/2014, 4:19 PM   This patient was seen in conjunction with Dr. Donnetta Hutching     I have examined the patient, reviewed and agree with above.  EARLY, TODD, MD 08/05/2014 4:47 PM

## 2014-08-07 DIAGNOSIS — N183 Chronic kidney disease, stage 3 (moderate): Secondary | ICD-10-CM | POA: Diagnosis not present

## 2014-08-07 DIAGNOSIS — Z9889 Other specified postprocedural states: Secondary | ICD-10-CM | POA: Diagnosis not present

## 2014-08-07 DIAGNOSIS — K5900 Constipation, unspecified: Secondary | ICD-10-CM | POA: Diagnosis not present

## 2014-08-18 ENCOUNTER — Emergency Department (HOSPITAL_COMMUNITY)
Admission: EM | Admit: 2014-08-18 | Discharge: 2014-08-19 | Disposition: A | Discharge status: Other Acute Inpatient Hospital | Payer: Medicare Other | Attending: Emergency Medicine | Admitting: Emergency Medicine

## 2014-08-18 ENCOUNTER — Ambulatory Visit (INDEPENDENT_AMBULATORY_CARE_PROVIDER_SITE_OTHER): Payer: Medicare Other | Admitting: Psychiatry

## 2014-08-18 ENCOUNTER — Encounter (HOSPITAL_COMMUNITY): Payer: Self-pay | Admitting: Emergency Medicine

## 2014-08-18 ENCOUNTER — Ambulatory Visit (HOSPITAL_COMMUNITY)
Admission: RE | Admit: 2014-08-18 | Discharge: 2014-08-18 | Disposition: A | Discharge status: Home / Self Care | Payer: Medicare Other | Attending: Psychiatry | Admitting: Psychiatry

## 2014-08-18 ENCOUNTER — Emergency Department (HOSPITAL_COMMUNITY): Payer: Medicare Other

## 2014-08-18 ENCOUNTER — Encounter (HOSPITAL_COMMUNITY): Payer: Self-pay | Admitting: Psychiatry

## 2014-08-18 VITALS — BP 112/69 | HR 87 | Ht 71.0 in | Wt 198.6 lb

## 2014-08-18 DIAGNOSIS — R45851 Suicidal ideations: Secondary | ICD-10-CM | POA: Insufficient documentation

## 2014-08-18 DIAGNOSIS — Z87891 Personal history of nicotine dependence: Secondary | ICD-10-CM | POA: Insufficient documentation

## 2014-08-18 DIAGNOSIS — Z79899 Other long term (current) drug therapy: Secondary | ICD-10-CM | POA: Insufficient documentation

## 2014-08-18 DIAGNOSIS — Z8679 Personal history of other diseases of the circulatory system: Secondary | ICD-10-CM | POA: Diagnosis not present

## 2014-08-18 DIAGNOSIS — I714 Abdominal aortic aneurysm, without rupture: Secondary | ICD-10-CM | POA: Diagnosis not present

## 2014-08-18 DIAGNOSIS — F319 Bipolar disorder, unspecified: Secondary | ICD-10-CM

## 2014-08-18 DIAGNOSIS — F322 Major depressive disorder, single episode, severe without psychotic features: Secondary | ICD-10-CM

## 2014-08-18 DIAGNOSIS — E780 Pure hypercholesterolemia: Secondary | ICD-10-CM | POA: Insufficient documentation

## 2014-08-18 DIAGNOSIS — Z87448 Personal history of other diseases of urinary system: Secondary | ICD-10-CM | POA: Insufficient documentation

## 2014-08-18 DIAGNOSIS — J189 Pneumonia, unspecified organism: Secondary | ICD-10-CM

## 2014-08-18 DIAGNOSIS — R0602 Shortness of breath: Secondary | ICD-10-CM | POA: Diagnosis not present

## 2014-08-18 DIAGNOSIS — R531 Weakness: Secondary | ICD-10-CM | POA: Diagnosis not present

## 2014-08-18 DIAGNOSIS — F332 Major depressive disorder, recurrent severe without psychotic features: Secondary | ICD-10-CM | POA: Diagnosis not present

## 2014-08-18 LAB — COMPREHENSIVE METABOLIC PANEL
ALT: 12 U/L (ref 0–53)
AST: 16 U/L (ref 0–37)
Albumin: 4.4 g/dL (ref 3.5–5.2)
Alkaline Phosphatase: 99 U/L (ref 39–117)
Anion gap: 7 (ref 5–15)
BUN: 21 mg/dL (ref 6–23)
CO2: 26 mmol/L (ref 19–32)
Calcium: 10.7 mg/dL — ABNORMAL HIGH (ref 8.4–10.5)
Chloride: 105 mEq/L (ref 96–112)
Creatinine, Ser: 1.2 mg/dL (ref 0.50–1.35)
GFR calc Af Amer: 67 mL/min — ABNORMAL LOW (ref >90)
GFR calc non Af Amer: 58 mL/min — ABNORMAL LOW (ref >90)
Glucose, Bld: 94 mg/dL (ref 70–99)
Potassium: 4.1 mmol/L (ref 3.5–5.1)
Sodium: 138 mmol/L (ref 135–145)
Total Bilirubin: 0.7 mg/dL (ref 0.3–1.2)
Total Protein: 7.7 g/dL (ref 6.0–8.3)

## 2014-08-18 LAB — CBC
HCT: 40.6 % (ref 39.0–52.0)
Hemoglobin: 12.9 g/dL — ABNORMAL LOW (ref 13.0–17.0)
MCH: 28.3 pg (ref 26.0–34.0)
MCHC: 31.8 g/dL (ref 30.0–36.0)
MCV: 89 fL (ref 78.0–100.0)
Platelets: 185 10*3/uL (ref 150–400)
RBC: 4.56 MIL/uL (ref 4.22–5.81)
RDW: 13.3 % (ref 11.5–15.5)
WBC: 8.5 10*3/uL (ref 4.0–10.5)

## 2014-08-18 LAB — ACETAMINOPHEN LEVEL: Acetaminophen (Tylenol), Serum: <10 ug/mL — ABNORMAL LOW (ref 10–30)

## 2014-08-18 LAB — ETHANOL: Alcohol, Ethyl (B): <5 mg/dL (ref 0–9)

## 2014-08-18 LAB — SALICYLATE LEVEL: Salicylate Lvl: <4 mg/dL (ref 2.8–20.0)

## 2014-08-18 MED ORDER — PRAVASTATIN SODIUM 20 MG PO TABS
20.0000 mg | ORAL_TABLET | Freq: Every day | ORAL | Status: DC
  Administered 2014-08-18: 20 mg via ORAL
  Filled 2014-08-18 (×2): qty 1

## 2014-08-18 MED ORDER — SIMVASTATIN 10 MG PO TABS: 10.0000 mg | ORAL_TABLET | Freq: Every day | ORAL | Status: DC

## 2014-08-18 MED ORDER — LORAZEPAM 1 MG PO TABS: 1.0000 mg | ORAL_TABLET | Freq: Three times a day (TID) | ORAL | Status: DC | PRN

## 2014-08-18 MED ORDER — MULTI-VITAMIN/MINERALS PO TABS
1.0000 | ORAL_TABLET | Freq: Every day | ORAL | Status: DC
  Administered 2014-08-18 – 2014-08-19 (×2): 1 via ORAL
  Filled 2014-08-18 (×2): qty 1

## 2014-08-18 MED ORDER — ACETAMINOPHEN 325 MG PO TABS: 650.0000 mg | ORAL_TABLET | ORAL | Status: DC | PRN

## 2014-08-18 MED ORDER — ALUM & MAG HYDROXIDE-SIMETH 200-200-20 MG/5ML PO SUSP: 30.0000 mL | ORAL | Status: DC | PRN

## 2014-08-18 MED ORDER — ESCITALOPRAM OXALATE 10 MG PO TABS
20.0000 mg | ORAL_TABLET | Freq: Every day | ORAL | Status: DC
  Administered 2014-08-18 – 2014-08-19 (×2): 20 mg via ORAL
  Filled 2014-08-18 (×2): qty 2

## 2014-08-18 MED ORDER — ONDANSETRON HCL 4 MG PO TABS: 4.0000 mg | ORAL_TABLET | Freq: Three times a day (TID) | ORAL | Status: DC | PRN

## 2014-08-18 MED ORDER — FENOFIBRATE 54 MG PO TABS
54.0000 mg | ORAL_TABLET | Freq: Every day | ORAL | Status: DC
  Administered 2014-08-18 – 2014-08-19 (×2): 54 mg via ORAL
  Filled 2014-08-18 (×2): qty 1

## 2014-08-18 MED ORDER — RISPERIDONE 1 MG PO TABS
1.0000 mg | ORAL_TABLET | Freq: Every day | ORAL | Status: DC
  Administered 2014-08-18: 1 mg via ORAL
  Filled 2014-08-18: qty 1

## 2014-08-18 NOTE — ED Notes (Signed)
EKG completed as ordered. Pt pleasant and cooperative with care; no inappropriate behaviors noted at this time. Pt with c/o depression and thoughts of suicide with no plan currently. Pt verbally contracts for safety. No s/s of distress noted on assessment.

## 2014-08-18 NOTE — Progress Notes (Signed)
[name] [id number] 74 y.o.  08/18/2014 12:01 PM  Chief Complaint:  I am very depressed.  I am having suicidal thoughts.               History of Present Illness: Fernando Carr came for his followup appointment.  He is complaining of poor sleep, increased depression and having suicidal thoughts.  He had AAA surgery on December 7 and after that he has to week rehabilitation at [facility] .  He admitted he may not be taking his medication Lexapro and Risperdal as prescribed because he's been forgetting a lot.  Lately he is complaining of poor sleep, racing thoughts, crying spells and severe anhedonia and feeling of hopelessness and worthlessness.  He also endorsed having suicidal thoughts and plan to take his own life by drinking antifreeze.  Patient has history of suicidal attempt by taking antifreeze.  He has no family around and he feels very tired, lack of energy, lack of social support.  His daughter lives in [place] who cannot come because she recently had baby.  Patient also endorsed some time hallucination but did not describe the details.  Patient has noticed difficulty remembering things.  His attention and concentration is poor.  He reported not sure about his daily intake and appetite and he appears very frail and tired.  He also has tremors in his hands and he has difficulty walking.  However he is able to do his ADLs.  Patient denies drinking or using any illegal substances.  He lives alone.  Suicidal Ideation: Yes Plan Formed: Yes Patient has means to carry out plan: Patient has taken antifreeze in the past.  Homicidal Ideation: No Plan Formed: No Patient has means to carry out plan: No  Review of Systems: Psychiatric: Agitation: No Hallucination: Yes Depressed Mood: Yes Insomnia: Yes Hypersomnia: No Altered Concentration: No Feels Worthless: Yes Grandiose Ideas: No Belief In Special Powers: No New/Increased Substance Abuse:  No Compulsions: No  Neurologic: Headache: No Seizure: No Paresthesias: No  Medical History:  Patient has a history of hyperlipidemia, obesity and recently started seeing Dr. Deforest Hoyles at [facility] physician.    Outpatient Encounter Prescriptions as of 08/18/2014  Medication Sig  . aspirin EC 81 MG tablet Take 1 tablet (81 mg total) by mouth daily.  Marland Kitchen escitalopram (LEXAPRO) 20 MG tablet Take 1 tablet (20 mg total) by mouth daily.  . fenofibrate micronized (LOFIBRA) 67 MG capsule Take 67 mg by mouth daily.   . fludrocortisone (FLORINEF) 0.1 MG tablet Take 1 tablet (0.1 mg total) by mouth daily.  . Multiple Vitamins-Minerals (MULTIVITAMIN WITH MINERALS) tablet Take 1 tablet by mouth daily.  Marland Kitchen oxyCODONE-acetaminophen (PERCOCET/ROXICET) 5-325 MG per tablet Take 1-2 tablets by mouth every 6 (six) hours as needed for moderate pain. (Patient not taking: Reported on 08/05/2014)  . risperiDONE (RISPERDAL) 1 MG tablet Take 1 tablet (1 mg total) by mouth at bedtime.  . simvastatin (ZOCOR) 20 MG tablet Take 10 mg by mouth daily.    Recent Results (from the past 2160 hour(s))  Surgical pcr screen     Status: None   Collection Time: 06/25/14  2:30 PM  Result Value Ref Range   MRSA, PCR NEGATIVE NEGATIVE   Staphylococcus aureus NEGATIVE NEGATIVE    Comment:        The Xpert SA Assay (FDA approved for NASAL specimens in patients over 32 years of age), is one component of a comprehensive surveillance program.  Test performance has  been validated by [facility] for patients greater than or equal to 64 year old. It is not intended to diagnose infection nor to guide or monitor treatment.   APTT     Status: None   Collection Time: 06/25/14  2:57 PM  Result Value Ref Range   aPTT 34 24 - 37 seconds  CBC     Status: Abnormal   Collection Time: 06/25/14  2:57 PM  Result Value Ref Range   WBC 7.3 4.0 - 10.5 K/uL   RBC 4.40 4.22 - 5.81 MIL/uL   Hemoglobin 13.0 13.0 - 17.0 g/dL   HCT 39.3 39.0 - 52.0  %   MCV 89.3 78.0 - 100.0 fL   MCH 29.5 26.0 - 34.0 pg   MCHC 33.1 30.0 - 36.0 g/dL   RDW 12.9 11.5 - 15.5 %   Platelets 142 (L) 150 - 400 K/uL  Comprehensive metabolic panel     Status: Abnormal   Collection Time: 06/25/14  2:57 PM  Result Value Ref Range   Sodium 138 137 - 147 mEq/L   Potassium 4.2 3.7 - 5.3 mEq/L   Chloride 105 96 - 112 mEq/L   CO2 20 19 - 32 mEq/L   Glucose, Bld 97 70 - 99 mg/dL   BUN 20 6 - 23 mg/dL   Creatinine, Ser 1.17 0.50 - 1.35 mg/dL   Calcium 10.8 (H) 8.4 - 10.5 mg/dL   Total Protein 6.9 6.0 - 8.3 g/dL   Albumin 3.6 3.5 - 5.2 g/dL   AST 10 0 - 37 U/L   ALT 9 0 - 53 U/L   Alkaline Phosphatase 94 39 - 117 U/L   Total Bilirubin <0.2 (L) 0.3 - 1.2 mg/dL   GFR calc non Af Amer 60 (L) >90 mL/min   GFR calc Af Amer 70 (L) >90 mL/min    Comment: (NOTE) The eGFR has been calculated using the CKD EPI equation. This calculation has not been validated in all clinical situations. eGFR's persistently <90 mL/min signify possible Chronic Kidney Disease.    Anion gap 13 5 - 15  Protime-INR     Status: None   Collection Time: 06/25/14  2:57 PM  Result Value Ref Range   Prothrombin Time 14.1 11.6 - 15.2 seconds   INR 1.07 0.00 - 1.49  Urinalysis, Routine w reflex microscopic     Status: Abnormal   Collection Time: 06/25/14  2:57 PM  Result Value Ref Range   Color, Urine YELLOW YELLOW   APPearance CLOUDY (A) CLEAR   Specific Gravity, Urine 1.026 1.005 - 1.030   pH 5.5 5.0 - 8.0   Glucose, UA NEGATIVE NEGATIVE mg/dL   Hgb urine dipstick NEGATIVE NEGATIVE   Bilirubin Urine SMALL (A) NEGATIVE   Ketones, ur 15 (A) NEGATIVE mg/dL   Protein, ur 30 (A) NEGATIVE mg/dL   Urobilinogen, UA 1.0 0.0 - 1.0 mg/dL   Nitrite NEGATIVE NEGATIVE   Leukocytes, UA NEGATIVE NEGATIVE  Urine microscopic-add on     Status: Abnormal   Collection Time: 06/25/14  2:57 PM  Result Value Ref Range   Squamous Epithelial / LPF RARE RARE   WBC, UA 3-6 <3 WBC/hpf   Bacteria, UA FEW (A)  RARE   Casts HYALINE CASTS (A) NEGATIVE   Crystals CA OXALATE CRYSTALS (A) NEGATIVE   Urine-Other MUCOUS PRESENT   Blood gas, arterial     Status: Abnormal   Collection Time: 06/25/14  2:58 PM  Result Value Ref Range   FIO2 0.21 %  pH, Arterial 7.394 7.350 - 7.450   pCO2 arterial 38.9 35.0 - 45.0 mmHg   pO2, Arterial 71.6 (L) 80.0 - 100.0 mmHg   Bicarbonate 23.3 20.0 - 24.0 mEq/L   TCO2 24.5 0 - 100 mmol/L   Acid-base deficit 0.9 0.0 - 2.0 mmol/L   O2 Saturation 95.2 %   Patient temperature 98.6    Collection site LEFT RADIAL    Drawn by [id number]    Sample type ARTERIAL    Allens test (pass/fail) PASS PASS  Type and screen     Status: None   Collection Time: 06/25/14  2:58 PM  Result Value Ref Range   ABO/RH(D) O POS    Antibody Screen NEG    Sample Expiration 07/09/2014   ABO/Rh     Status: None   Collection Time: 06/25/14  2:58 PM  Result Value Ref Range   ABO/RH(D) O POS   CBC     Status: Abnormal   Collection Time: 07/07/14 10:59 AM  Result Value Ref Range   WBC 14.5 (H) 4.0 - 10.5 K/uL   RBC 4.11 (L) 4.22 - 5.81 MIL/uL   Hemoglobin 12.0 (L) 13.0 - 17.0 g/dL   HCT 36.5 (L) 39.0 - 52.0 %   MCV 88.8 78.0 - 100.0 fL   MCH 29.2 26.0 - 34.0 pg   MCHC 32.9 30.0 - 36.0 g/dL   RDW 13.0 11.5 - 15.5 %   Platelets 124 (L) 150 - 400 K/uL  Basic metabolic panel     Status: Abnormal   Collection Time: 07/07/14 10:59 AM  Result Value Ref Range   Sodium 140 137 - 147 mEq/L   Potassium 4.1 3.7 - 5.3 mEq/L   Chloride 106 96 - 112 mEq/L   CO2 25 19 - 32 mEq/L   Glucose, Bld 134 (H) 70 - 99 mg/dL   BUN 21 6 - 23 mg/dL   Creatinine, Ser 1.13 0.50 - 1.35 mg/dL   Calcium 9.7 8.4 - 10.5 mg/dL   GFR calc non Af Amer 63 (L) >90 mL/min   GFR calc Af Amer 73 (L) >90 mL/min    Comment: (NOTE) The eGFR has been calculated using the CKD EPI equation. This calculation has not been validated in all clinical situations. eGFR's persistently <90 mL/min signify possible Chronic  Kidney Disease.    Anion gap 9 5 - 15  Protime-INR     Status: Abnormal   Collection Time: 07/07/14 10:59 AM  Result Value Ref Range   Prothrombin Time 15.5 (H) 11.6 - 15.2 seconds   INR 1.21 0.00 - 1.49  APTT     Status: None   Collection Time: 07/07/14 10:59 AM  Result Value Ref Range   aPTT 33 24 - 37 seconds  Magnesium     Status: None   Collection Time: 07/07/14 10:59 AM  Result Value Ref Range   Magnesium 1.7 1.5 - 2.5 mg/dL  Blood gas, arterial     Status: Abnormal   Collection Time: 07/07/14 11:20 AM  Result Value Ref Range   O2 Content 10.0 L/min   Delivery systems OXYGEN MASK    pH, Arterial 7.285 (L) 7.350 - 7.450   pCO2 arterial 53.2 (H) 35.0 - 45.0 mmHg   pO2, Arterial 121.0 (H) 80.0 - 100.0 mmHg   Bicarbonate 24.7 (H) 20.0 - 24.0 mEq/L   TCO2 26.4 0 - 100 mmol/L   Acid-base deficit 1.2 0.0 - 2.0 mmol/L   O2 Saturation 98.3 %   Patient temperature  97.4    Collection site A-LINE    Drawn by COLLECTED BY RT    Sample type ARTERIAL DRAW    Allens test (pass/fail) PASS PASS  CBC     Status: Abnormal   Collection Time: 07/08/14  4:00 AM  Result Value Ref Range   WBC 8.8 4.0 - 10.5 K/uL   RBC 4.02 (L) 4.22 - 5.81 MIL/uL   Hemoglobin 11.6 (L) 13.0 - 17.0 g/dL   HCT 36.2 (L) 39.0 - 52.0 %   MCV 90.0 78.0 - 100.0 fL   MCH 28.9 26.0 - 34.0 pg   MCHC 32.0 30.0 - 36.0 g/dL   RDW 13.2 11.5 - 15.5 %   Platelets 123 (L) 150 - 400 K/uL  Comprehensive metabolic panel     Status: Abnormal   Collection Time: 07/08/14  4:00 AM  Result Value Ref Range   Sodium 140 137 - 147 mEq/L   Potassium 4.6 3.7 - 5.3 mEq/L   Chloride 107 96 - 112 mEq/L   CO2 24 19 - 32 mEq/L   Glucose, Bld 130 (H) 70 - 99 mg/dL   BUN 15 6 - 23 mg/dL   Creatinine, Ser 1.02 0.50 - 1.35 mg/dL   Calcium 9.7 8.4 - 10.5 mg/dL   Total Protein 5.6 (L) 6.0 - 8.3 g/dL   Albumin 2.8 (L) 3.5 - 5.2 g/dL   AST 13 0 - 37 U/L   ALT 8 0 - 53 U/L   Alkaline Phosphatase 71 39 - 117 U/L   Total Bilirubin 0.4  0.3 - 1.2 mg/dL   GFR calc non Af Amer 71 (L) >90 mL/min   GFR calc Af Amer 82 (L) >90 mL/min    Comment: (NOTE) The eGFR has been calculated using the CKD EPI equation. This calculation has not been validated in all clinical situations. eGFR's persistently <90 mL/min signify possible Chronic Kidney Disease.    Anion gap 9 5 - 15  Magnesium     Status: None   Collection Time: 07/08/14  4:00 AM  Result Value Ref Range   Magnesium 1.6 1.5 - 2.5 mg/dL  Amylase     Status: None   Collection Time: 07/08/14  4:00 AM  Result Value Ref Range   Amylase 98 0 - 105 U/L    Past Psychiatric History/Hospitalization(s): Patient has at least one psychiatric inpatient treatment in 2006 detail suicidal attempt. He injected Ethyline Glycol. 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  He suffered significant lost including his property and he was feeling hopeless helpless and having paranoid thoughts.  Recently they had tried Abilify but he developed tremors and worsening of the symptoms.  He had a good response with Risperdal in the past.    Anxiety: No Bipolar Disorder: No Depression: Yes Mania: No Psychosis: Yes Schizophrenia: No Personality Disorder: No Hospitalization for psychiatric illness: Yes History of Electroconvulsive Shock Therapy: No Prior Suicide Attempts: Yes  Physical Exam: Constitutional:  BP 112/69 mmHg  Pulse 87  Ht $R'5\' 11"'cA$  (1.803 m)  Wt 198 lb 9.6 oz (90.084 kg)  BMI 27.71 kg/m2  General Appearance: alert, oriented, no acute distress, well nourished, obese and Maintain fair eye contact  Review of Systems  Constitutional: Positive for malaise/fatigue.  Neurological: Positive for tremors and weakness.  Psychiatric/Behavioral: Positive for depression, suicidal ideas and hallucinations. The patient is nervous/anxious and has insomnia.    Musculoskeletal: Strength & Muscle Tone: within normal limits Gait & Station: unsteady Patient leans:  N/A  Mental status examination : Patient is casually dressed and fairly groomed.  He appears tired.  He maintained fair eye contact.  His his speech is slow but coherent.  His attention and concentration is poor.  He endorse suicidal thoughts and plan to take antifreeze.  He also endorse hallucination but did not describe the details.  His psychomotor activity is slow.  He has tremors and shakes in his hands. His thought process is slow. He is alert and oriented x3.  His insight judgment and impulse control is okay.  New problem, with additional work up planned, Review of Psycho-Social Stressors (1), Review or order clinical lab tests (1), Review and summation of old records (2), Established Problem, Worsening (2), Review of Last Therapy Session (1) and Review of Medication Regimen & Side Effects (2)  Assessment: Axis I: Bipolar disorder type I   Axis II: Deferred  Axis III:  Patient Active Problem List   Diagnosis Date Noted  . AAA (abdominal aortic aneurysm) 07/07/2014  . Fernando (acute kidney injury) 03/10/2014  . Dehydration 03/10/2014  . FTT (failure to thrive) in adult 03/10/2014  . 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 Sinus bradycardia 03/10/2014  . HTN (hypertension) 03/10/2014  . Hypotension, postural 03/09/2014  . CRD (chronic renal disease), stage III 01/09/2014  . Abdominal aortic aneurysm 12/20/2011  . Bipolar 1 disorder 10/24/2011  . Hyperlipemia 10/10/2011    Axis IV: Mild  Axis V: 60-65   Plan: Patient has been decompensating from the past.  I will take him to assessment as patient may requires inpatient psychiatric treatment.  He admitted not taking his medication on time and has been forgetting it.  Patient also appears having cognition impairment.  Patient agreed with the plan.  Spoke to [place] and triage and assessment for further workup.  Time spent 25 minutes.  [name],SYED T., MD 08/18/2014

## 2014-08-18 NOTE — ED Notes (Addendum)
Pt sent over from [facility] with c/o SI with no plan and depression due personal issues. Denies HI, delusions, hallucinations. Pt states he doesn't care about anything and is only eating once a day. BH recommends inpatient and med clearance.

## 2014-08-18 NOTE — ED Provider Notes (Signed)
CSN: [id number]     Arrival date & time 08/18/14  1320 History   First MD Initiated Contact with Patient 08/18/14 1404     Chief Complaint  Patient presents with  . Medical Clearance  . Suicidal     (Consider location/radiation/quality/duration/timing/severity/associated sxs/prior Treatment) HPI   74 year old male with depression. Long-standing history the same. Patient reports serious prior suicide attempt. He ingested ethylene glycol requiring acute dialysis. Currently expressing suicidal thoughts but no concrete plan. Denies ingestion. No homicidal ideation. Patient has has been having increased depression since recuperating after elective abdominal aortic aneurysm repair last month. Feelings of hopelessness. Difficulty with sleep. No eating much. Physically though he reports he has been healing well.   Past Medical History  Diagnosis Date  . High cholesterol   . Bipolar disorder   . AAA (abdominal aortic aneurysm)   . Depression   . Renal insufficiency     chronic renal   . Suicide attempt aug. 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 AAA (abdominal aortic aneurysm)    Past Surgical History  Procedure Laterality Date  . Knee arthroscopy Left 1972  . Abdominal aortic aneurysm repair N/A 07/07/2014    Procedure: ABDOMINAL AORTIC ANEURYSM REPAIR;  Surgeon: Rosetta Posner, MD;  Location: [facility] OR;  Service: Vascular;  Laterality: N/A;   Family History  Problem Relation Age of Onset  . Alcohol abuse Mother   . Alcohol abuse Father   . Suicidality Paternal Uncle   . Suicidality Cousin   . Depression Daughter    History  Substance Use Topics  . Smoking status: Former Smoker -- 1.00 packs/day for 40 years    Types: Cigarettes    Quit date: 03/11/2014  . Smokeless tobacco: Never Used  . Alcohol Use: No    Review of Systems  All systems reviewed and negative, other than as noted in HPI.   Allergies  Review of patient's allergies indicates no known  allergies.  Home Medications   Prior to Admission medications   Medication Sig Start Date End Date Taking? Authorizing Provider  escitalopram (LEXAPRO) 20 MG tablet Take 1 tablet (20 mg total) by mouth daily. 05/20/14  Yes Kathlee Nations, MD  fenofibrate micronized (LOFIBRA) 67 MG capsule Take 67 mg by mouth daily.  10/04/11  Yes Historical Provider, MD  Multiple Vitamins-Minerals (MULTIVITAMIN WITH MINERALS) tablet Take 1 tablet by mouth daily.   Yes Historical Provider, MD  risperiDONE (RISPERDAL) 1 MG tablet Take 1 tablet (1 mg total) by mouth at bedtime. 05/20/14  Yes Kathlee Nations, MD  simvastatin (ZOCOR) 20 MG tablet Take 10 mg by mouth daily.  05/23/11  Yes Historical Provider, MD  aspirin EC 81 MG tablet Take 1 tablet (81 mg total) by mouth daily. Patient not taking: Reported on 08/18/2014 07/11/14   Alvia Grove, PA-C  fludrocortisone (FLORINEF) 0.1 MG tablet Take 1 tablet (0.1 mg total) by mouth daily. Patient not taking: Reported on 08/18/2014 04/19/14   Jasper Riling. Pickering, MD  oxyCODONE-acetaminophen (PERCOCET/ROXICET) 5-325 MG per tablet Take 1-2 tablets by mouth every 6 (six) hours as needed for moderate pain. Patient not taking: Reported on 08/05/2014 07/11/14   Joelene Millin A Trinh, PA-C   BP 116/69 mmHg  Pulse 65  Temp(Src) 97.5 F (36.4 C) (Oral)  Resp 16  SpO2 99% Physical Exam  Constitutional: He is oriented to person, place, and time. He appears well-developed and well-nourished. No distress.  HENT:  Head: Normocephalic and  atraumatic.  Eyes: Conjunctivae are normal. Right eye exhibits no discharge. Left eye exhibits no discharge.  Neck: Neck supple.  Cardiovascular: Normal rate, regular rhythm and normal heart sounds.  Exam reveals no gallop and no friction rub.   No murmur heard. Pulmonary/Chest: Effort normal and breath sounds normal. No respiratory distress.  Abdominal: Soft. He exhibits no distension. There is no tenderness.  Midline surgical incision appears to  be healing well  Musculoskeletal: He exhibits no edema or tenderness.  Neurological: He is alert and oriented to person, place, and time.  Resting tremor  Skin: Skin is warm and dry.  Psychiatric: His behavior is normal. Thought content normal.  Nursing note and vitals reviewed.   ED Course  Procedures (including critical care time) Labs Review Labs Reviewed  ACETAMINOPHEN LEVEL - Abnormal; Notable for the following:    Acetaminophen (Tylenol), Serum <10.0 (*)    All other components within normal limits  CBC - Abnormal; Notable for the following:    Hemoglobin 12.9 (*)    All other components within normal limits  COMPREHENSIVE METABOLIC PANEL - Abnormal; Notable for the following:    Calcium 10.7 (*)    GFR calc non Af Amer 58 (*)    GFR calc Af Amer 67 (*)    All other components within normal limits  ETHANOL  SALICYLATE LEVEL  URINE RAPID DRUG SCREEN (HOSP PERFORMED)    Imaging Review No results found.   EKG Interpretation None      MDM   Final diagnoses:  Severe depression    73yM with increasing depression after elective AAA repair. Has been recovering well otherwise. Passive SI. Does have history of serious suicide attempt years ago after ingested antifreeze and required emergent dialysis. He is medically cleared at this point. TTS eval.     Virgel Manifold, MD 08/18/14 [phone]
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
## 2014-08-19 ENCOUNTER — Encounter (HOSPITAL_COMMUNITY): Payer: Self-pay | Admitting: General Practice

## 2014-08-19 ENCOUNTER — Inpatient Hospital Stay (HOSPITAL_COMMUNITY)
Admission: AD | Admit: 2014-08-19 | Discharge: 2014-08-27 | DRG: 885 | Disposition: A | Discharge status: Home / Self Care | Payer: Medicare Other | Source: Intra-hospital | Attending: Psychiatry | Admitting: Psychiatry

## 2014-08-19 DIAGNOSIS — Z87891 Personal history of nicotine dependence: Secondary | ICD-10-CM

## 2014-08-19 DIAGNOSIS — Z79899 Other long term (current) drug therapy: Secondary | ICD-10-CM | POA: Diagnosis not present

## 2014-08-19 DIAGNOSIS — I129 Hypertensive chronic kidney disease with stage 1 through stage 4 chronic kidney disease, or unspecified chronic kidney disease: Secondary | ICD-10-CM | POA: Diagnosis present

## 2014-08-19 DIAGNOSIS — G259 Extrapyramidal and movement disorder, unspecified: Secondary | ICD-10-CM | POA: Diagnosis present

## 2014-08-19 DIAGNOSIS — E780 Pure hypercholesterolemia: Secondary | ICD-10-CM | POA: Diagnosis present

## 2014-08-19 DIAGNOSIS — Z87448 Personal history of other diseases of urinary system: Secondary | ICD-10-CM | POA: Diagnosis not present

## 2014-08-19 DIAGNOSIS — R45851 Suicidal ideations: Secondary | ICD-10-CM | POA: Diagnosis not present

## 2014-08-19 DIAGNOSIS — Z8679 Personal history of other diseases of the circulatory system: Secondary | ICD-10-CM | POA: Diagnosis not present

## 2014-08-19 DIAGNOSIS — E785 Hyperlipidemia, unspecified: Secondary | ICD-10-CM | POA: Diagnosis present

## 2014-08-19 DIAGNOSIS — N183 Chronic kidney disease, stage 3 (moderate): Secondary | ICD-10-CM | POA: Diagnosis present

## 2014-08-19 DIAGNOSIS — F332 Major depressive disorder, recurrent severe without psychotic features: Secondary | ICD-10-CM | POA: Diagnosis not present

## 2014-08-19 DIAGNOSIS — G2111 Neuroleptic induced parkinsonism: Secondary | ICD-10-CM | POA: Diagnosis not present

## 2014-08-19 DIAGNOSIS — F322 Major depressive disorder, single episode, severe without psychotic features: Secondary | ICD-10-CM | POA: Insufficient documentation

## 2014-08-19 LAB — RAPID URINE DRUG SCREEN, HOSP PERFORMED
Amphetamines: NOT DETECTED
Barbiturates: NOT DETECTED
Benzodiazepines: NOT DETECTED
Cocaine: NOT DETECTED
Opiates: NOT DETECTED
Tetrahydrocannabinol: NOT DETECTED

## 2014-08-19 LAB — URINALYSIS, ROUTINE W REFLEX MICROSCOPIC
Bilirubin Urine: NEGATIVE
Glucose, UA: NEGATIVE mg/dL
Hgb urine dipstick: NEGATIVE
Ketones, ur: NEGATIVE mg/dL
Leukocytes, UA: NEGATIVE
Nitrite: NEGATIVE
Protein, ur: NEGATIVE mg/dL
Specific Gravity, Urine: 1.022 (ref 1.005–1.030)
Urobilinogen, UA: 1 mg/dL (ref 0.0–1.0)
pH: 5.5 (ref 5.0–8.0)

## 2014-08-19 MED ORDER — ESCITALOPRAM OXALATE 20 MG PO TABS
20.0000 mg | ORAL_TABLET | Freq: Every day | ORAL | Status: DC
  Administered 2014-08-20: 20 mg via ORAL
  Filled 2014-08-19: qty 1
  Filled 2014-08-19: qty 2
  Filled 2014-08-19 (×2): qty 1

## 2014-08-19 MED ORDER — FENOFIBRATE 54 MG PO TABS
54.0000 mg | ORAL_TABLET | Freq: Every day | ORAL | Status: DC
  Administered 2014-08-20 – 2014-08-27 (×8): 54 mg via ORAL
  Filled 2014-08-19 (×12): qty 1

## 2014-08-19 MED ORDER — ADULT MULTIVITAMIN W/MINERALS CH
1.0000 | ORAL_TABLET | Freq: Every day | ORAL | Status: DC
  Administered 2014-08-20 – 2014-08-27 (×8): 1 via ORAL
  Filled 2014-08-19 (×12): qty 1

## 2014-08-19 MED ORDER — PRAVASTATIN SODIUM 20 MG PO TABS
20.0000 mg | ORAL_TABLET | Freq: Every day | ORAL | Status: DC
  Administered 2014-08-20 – 2014-08-26 (×7): 20 mg via ORAL
  Filled 2014-08-19 (×12): qty 1

## 2014-08-19 MED ORDER — RISPERIDONE 1 MG PO TABS
1.0000 mg | ORAL_TABLET | Freq: Every day | ORAL | Status: DC
  Filled 2014-08-19 (×3): qty 1

## 2014-08-19 NOTE — Tx Team (Addendum)
Initial Interdisciplinary Treatment Plan   PATIENT STRESSORS: Health problems   PATIENT STRENGTHS: Average or above average intelligence General fund of knowledge   PROBLEM LIST: Problem List/Patient Goals Date to be addressed Date deferred Reason deferred Estimated date of resolution  Suicidal Ideation 08/19/2014           Depression 08/19/2014           "I can't decide on what to do. I'm not eating 3 meals a day" 08/19/14           "Think I'd like to take suicide out of the ideas". 08/19/14                  DISCHARGE CRITERIA:  Ability to meet basic life and health needs Improved stabilization in mood, thinking, and/or behavior Verbal commitment to aftercare and medication compliance  PRELIMINARY DISCHARGE PLAN: Outpatient therapy Return to previous living arrangement  PATIENT/FAMIILY INVOLVEMENT: This treatment plan has been presented to and reviewed with the patient, Fernando Carr.  The patient and family have been given the opportunity to ask questions and make suggestions.  Gaylan Gerold E 08/19/2014, 6:19 PM

## 2014-08-19 NOTE — Consult Note (Addendum)
[facility] Face-to-Face Psychiatry Consult   Reason for Consult:  Depression Referring Physician:  EDP Fernando Carr is an 74 y.o. male. Total Time spent with patient: 45 minutes  Assessment: AXIS I:  Major Depression, Recurrent severe AXIS II:  Deferred AXIS III:   Past Medical History  Diagnosis Date  . High cholesterol   . Bipolar disorder   . AAA (abdominal aortic aneurysm)   . Depression   . Renal insufficiency     chronic renal   . Suicide attempt aug. 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 AAA (abdominal aortic aneurysm)    AXIS IV:  other psychosocial or environmental problems, problems related to social environment and problems with primary support group AXIS V:  41-50 serious symptoms  Plan:  Recommend psychiatric Inpatient admission when medically cleared. Seeking placement at a Gero-pschiatric unit  Subjective:   Fernando Carr is a 74 y.o. male patient admitted with Major depression.  HPI:  Caucasian male, 74 years old was seen today for increased depression.  Patient was seen by Dr Adele Schilder yesterday and was sent to [facility] for admission. Patient is already taking Lexapro and Risperdal Prescribed by Dr Jobie Quaker and stated that he is still not feeling better.  He reported feeling suicidal yesterday but denies suicide today.  Patient admitted to attempting suicide in 2006 by drinking Antifreez and was hospitalized in our [facility].  Patient live alone and reported good sleep but poor appetite.  Patient reported poor energy and stated he is not able to to do his grocery shopping.  Patient made no eye contact with providers and his affect was flat.  We have accepted him for admission and will be transferring him to any available bed for treatment.  He denies SI/HI/AVH.  HPI Elements:   Location:  Major depressive dioorder, Recurrent, severe without Psychotic features.. Quality:  Depressed feelings, poor energy and appetite, occasional suicidal feelings. Severity:   severe. Timing:  Acute, on going. Duration:  Chronic mental and medical illness.. Context:  Seeking treatment for depression..  Past Psychiatric History: Past Medical History  Diagnosis Date  . High cholesterol   . Bipolar disorder   . AAA (abdominal aortic aneurysm)   . Depression   . Renal insufficiency     chronic renal   . Suicide attempt aug. 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 AAA (abdominal aortic aneurysm)     reports that he quit smoking about 5 months ago. His smoking use included Cigarettes. He has a 40 pack-year smoking history. He has never used smokeless tobacco. He reports that he does not drink alcohol or use illicit drugs. Family History  Problem Relation Age of Onset  . Alcohol abuse Mother   . Alcohol abuse Father   . Suicidality Paternal Uncle   . Suicidality Cousin   . Depression Daughter            Allergies:  No Known Allergies  ACT Assessment Complete:  Yes:    Educational Status    Risk to Self: Risk to self with the past 6 months Is patient at risk for suicide?: Yes Substance abuse history and/or treatment for substance abuse?: No  Risk to Others:    Abuse:    Prior Inpatient Therapy:    Prior Outpatient Therapy:    Additional Information:      Objective: Blood pressure 145/74, pulse 78, temperature 98 F (36.7 C), temperature source Oral, resp. rate  18, SpO2 98 %.There is no weight on file to calculate BMI. Results for orders placed or performed during the hospital encounter of 08/18/14 (from the past 72 hour(s))  Acetaminophen level     Status: Abnormal   Collection Time: 08/18/14  2:14 PM  Result Value Ref Range   Acetaminophen (Tylenol), Serum <10.0 (L) 10 - 30 ug/mL    Comment:        THERAPEUTIC CONCENTRATIONS VARY SIGNIFICANTLY. A RANGE OF 10-30 ug/mL MAY BE AN EFFECTIVE CONCENTRATION FOR MANY PATIENTS. HOWEVER, SOME ARE BEST TREATED AT CONCENTRATIONS OUTSIDE THIS RANGE. ACETAMINOPHEN  CONCENTRATIONS >150 ug/mL AT 4 HOURS AFTER INGESTION AND >50 ug/mL AT 12 HOURS AFTER INGESTION ARE OFTEN ASSOCIATED WITH TOXIC REACTIONS.   CBC     Status: Abnormal   Collection Time: 08/18/14  2:14 PM  Result Value Ref Range   WBC 8.5 4.0 - 10.5 K/uL   RBC 4.56 4.22 - 5.81 MIL/uL   Hemoglobin 12.9 (L) 13.0 - 17.0 g/dL   HCT 40.6 39.0 - 52.0 %   MCV 89.0 78.0 - 100.0 fL   MCH 28.3 26.0 - 34.0 pg   MCHC 31.8 30.0 - 36.0 g/dL   RDW 13.3 11.5 - 15.5 %   Platelets 185 150 - 400 K/uL  Comprehensive metabolic panel     Status: Abnormal   Collection Time: 08/18/14  2:14 PM  Result Value Ref Range   Sodium 138 135 - 145 mmol/L    Comment: Please note change in reference range.   Potassium 4.1 3.5 - 5.1 mmol/L    Comment: Please note change in reference range.   Chloride 105 96 - 112 mEq/L   CO2 26 19 - 32 mmol/L   Glucose, Bld 94 70 - 99 mg/dL   BUN 21 6 - 23 mg/dL   Creatinine, Ser 1.20 0.50 - 1.35 mg/dL   Calcium 10.7 (H) 8.4 - 10.5 mg/dL   Total Protein 7.7 6.0 - 8.3 g/dL   Albumin 4.4 3.5 - 5.2 g/dL   AST 16 0 - 37 U/L   ALT 12 0 - 53 U/L   Alkaline Phosphatase 99 39 - 117 U/L   Total Bilirubin 0.7 0.3 - 1.2 mg/dL   GFR calc non Af Amer 58 (L) >90 mL/min   GFR calc Af Amer 67 (L) >90 mL/min    Comment: (NOTE) The eGFR has been calculated using the CKD EPI equation. This calculation has not been validated in all clinical situations. eGFR's persistently <90 mL/min signify possible Chronic Kidney Disease.    Anion gap 7 5 - 15  Ethanol (ETOH)     Status: None   Collection Time: 08/18/14  2:14 PM  Result Value Ref Range   Alcohol, Ethyl (B) <5 0 - 9 mg/dL    Comment:        LOWEST DETECTABLE LIMIT FOR SERUM ALCOHOL IS 11 mg/dL FOR MEDICAL PURPOSES ONLY   Salicylate level     Status: None   Collection Time: 08/18/14  2:14 PM  Result Value Ref Range   Salicylate Lvl <3.6 2.8 - 20.0 mg/dL   Labs are reviewed and are pertinent for Unremarkable   Current  Facility-Administered Medications  Medication Dose Route Frequency Provider Last Rate Last Dose  . acetaminophen (TYLENOL) tablet 650 mg  650 mg Oral Q4H PRN Virgel Manifold, MD      . alum & mag hydroxide-simeth (MAALOX/MYLANTA) 200-200-20 MG/5ML suspension 30 mL  30 mL Oral PRN Virgel Manifold, MD      .  escitalopram (LEXAPRO) tablet 20 mg  20 mg Oral Daily Virgel Manifold, MD   20 mg at 08/18/14 1646  . fenofibrate tablet 54 mg  54 mg Oral Daily Virgel Manifold, MD   54 mg at 08/18/14 1647  . LORazepam (ATIVAN) tablet 1 mg  1 mg Oral Q8H PRN Virgel Manifold, MD      . multivitamin with minerals tablet 1 tablet  1 tablet Oral Daily Virgel Manifold, MD   1 tablet at 08/18/14 1647  . ondansetron (ZOFRAN) tablet 4 mg  4 mg Oral Q8H PRN Virgel Manifold, MD      . pravastatin (PRAVACHOL) tablet 20 mg  20 mg Oral q1800 Julieta Bellini Absher, RPH   20 mg at 08/18/14 1738  . risperiDONE (RISPERDAL) tablet 1 mg  1 mg Oral QHS Virgel Manifold, MD   1 mg at 08/18/14 2059   Current Outpatient Prescriptions  Medication Sig Dispense Refill  . escitalopram (LEXAPRO) 20 MG tablet Take 1 tablet (20 mg total) by mouth daily. 30 tablet 1  . fenofibrate micronized (LOFIBRA) 67 MG capsule Take 67 mg by mouth daily.     . Multiple Vitamins-Minerals (MULTIVITAMIN WITH MINERALS) tablet Take 1 tablet by mouth daily.    . risperiDONE (RISPERDAL) 1 MG tablet Take 1 tablet (1 mg total) by mouth at bedtime. 30 tablet 1  . simvastatin (ZOCOR) 20 MG tablet Take 10 mg by mouth daily.     Marland Kitchen aspirin EC 81 MG tablet Take 1 tablet (81 mg total) by mouth daily. (Patient not taking: Reported on 08/18/2014) 30 tablet 0  . fludrocortisone (FLORINEF) 0.1 MG tablet Take 1 tablet (0.1 mg total) by mouth daily. (Patient not taking: Reported on 08/18/2014) 14 tablet 0  . oxyCODONE-acetaminophen (PERCOCET/ROXICET) 5-325 MG per tablet Take 1-2 tablets by mouth every 6 (six) hours as needed for moderate pain. (Patient not taking: Reported on 08/05/2014) 30 tablet 0     Psychiatric Specialty Exam:     Blood pressure 145/74, pulse 78, temperature 98 F (36.7 C), temperature source Oral, resp. rate 18, SpO2 98 %.There is no weight on file to calculate BMI.  General Appearance: Casual  Eye Contact::  Poor  Speech:  Clear and Coherent and Slow  Volume:  Decreased  Mood:  Depressed and Hopeless  Affect:  Congruent, Depressed and Flat  Thought Process:  Coherent, Goal Directed and Intact  Orientation:  Full (Time, Place, and Person)  Thought Content:  WDL  Suicidal Thoughts:  No  Homicidal Thoughts:  No  Memory:  Immediate;   Good Recent;   Good Remote;   Good  Judgement:  Fair  Insight:  Good  Psychomotor Activity:  Decreased and Psychomotor Retardation  Concentration:  Good  Recall:  NA  Fund of Knowledge:Good  Language: Good  Akathisia:  NA  Handed:  Right  AIMS (if indicated):     Assets:  Desire for Improvement  Sleep:      Musculoskeletal: Strength & Muscle Tone: was seen in bed lying down Gait & Station: seen in bed Patient leans: seen in bed  Treatment Plan Summary: Daily contact with patient to assess and evaluate symptoms and progress in treatment Medication management Accepted for Admissin and is waiting for available bed.  Delfin Gant    PMHNP-BC 08/19/2014 11:01 AM  Patient seen, evaluated and I agree with notes by Nurse Practitioner. Corena Pilgrim, MD

## 2014-08-19 NOTE — BH Assessment (Signed)
[facility] Assessment Progress Note  Per Debarah Crape, RN, [facility] pt has been accepted to [facility] by Berniece Andreas, MD to the service of Neita Garnet, MD, Rm [phone].  Pt signed Consent to Release Information to his psychiatrist, Dr Adele Schilder, as well as his PCP, Dr Lorenda Hatchet.  Signed form, along with IVC paperwork, has been faxed to [facility].  Pt's nurse, Otho Perl has been notified.  She agrees to send original paperwork along with the pt via [employer], and to call report to [phone].  Jalene Mullet, MA Triage Specialist 08/19/2014 @ 15:02

## 2014-08-19 NOTE — Progress Notes (Signed)
Adult Psychoeducational Group Note  Date:  08/19/2014 Time:  9:33 PM  Group Topic/Focus:  Wrap-Up Group:   The focus of this group is to help patients review their daily goal of treatment and discuss progress on daily workbooks.  Participation Level:  Active  Participation Quality:  Appropriate and Attentive  Affect:  Appropriate  Cognitive:  Appropriate  Insight: Appropriate  Engagement in Group:  Engaged  Modes of Intervention:  Discussion  Additional Comments:  Pts discussed the theme for the day "Recovery." Pt was recently admitted to the unit and stated why he is here; because of his "inability to focus on his needs of the future."  Clint Bolder 08/19/2014, 9:33 PM

## 2014-08-19 NOTE — Progress Notes (Signed)
Patient ID: Fernando Carr, male   DOB: [birth year], 74 y.o.   MRN: [id number]  [name] is a 74 year old caucasian male admitted IVC to [facility] for depression. Patient reports passive SI with no plan at this time and does contract for safety. Patient denies HI and A/V hallucinations. Patient has a past medical history of high cholesterol, aortic aneurysm with a recent repair in December, Bipolar disorder, and renal insufficiency. Patient was last admitted to [facility] in 2006 after an ethylene glycol ingestion. Patient was admitted and oriented to the unit. Patient's BP was initially low even when checked manually. Patient was educated on the importance of staying hydrated and was given Gatorade. NP Sandi Mariscal, and charge nurse were notified. NP stated to push fluids and check BP manually in both arms. This was followed up upon and BP and pulse were WDL(see flowsheet). Patient does not report any pain or discomfort. Patient's gait is steady and he does not report any dizziness or lightheadedness. Patient was given hygiene products and did not have any questions or concerns at end of admission process. Q15 minute safety checks were initiated and are maintained at this time. No distress noted.

## 2014-08-19 NOTE — BH Assessment (Signed)
[facility] Assessment Progress Note  Per Debarah Crape, RN, [facility], pt is to be admitted to [facility] later today once a bed has become available.  He will contact [facility] when this occurs.  Referral to [facility] is to be cancelled.  At 13:46 I spoke to [place] at [place] to notify her.  Jalene Mullet, MA Triage Specialist 08/19/2014 @ 13:52

## 2014-08-19 NOTE — Progress Notes (Signed)
Patient ID: Fernando Carr, male   DOB: 08-04-1940, 74 y.o.   MRN: [id number] PER STATE REGULATIONS 482.30  THIS CHART WAS REVIEWED FOR MEDICAL NECESSITY WITH RESPECT TO THE PATIENT'S ADMISSION/DURATION OF STAY.  NEXT REVIEW DATE: 08/23/14  Roma Schanz, RN, BSN CASE MANAGER

## 2014-08-20 ENCOUNTER — Encounter (HOSPITAL_COMMUNITY): Payer: Self-pay | Admitting: Psychiatry

## 2014-08-20 DIAGNOSIS — G259 Extrapyramidal and movement disorder, unspecified: Secondary | ICD-10-CM | POA: Diagnosis present

## 2014-08-20 DIAGNOSIS — F332 Major depressive disorder, recurrent severe without psychotic features: Principal | ICD-10-CM

## 2014-08-20 DIAGNOSIS — G2111 Neuroleptic induced parkinsonism: Secondary | ICD-10-CM

## 2014-08-20 MED ORDER — AMANTADINE HCL 100 MG PO CAPS
100.0000 mg | ORAL_CAPSULE | Freq: Two times a day (BID) | ORAL | Status: DC
  Administered 2014-08-20 – 2014-08-27 (×14): 100 mg via ORAL
  Filled 2014-08-20 (×20): qty 1

## 2014-08-20 MED ORDER — SERTRALINE HCL 25 MG PO TABS
25.0000 mg | ORAL_TABLET | Freq: Every day | ORAL | Status: DC
  Administered 2014-08-20 – 2014-08-23 (×4): 25 mg via ORAL
  Filled 2014-08-20 (×7): qty 1

## 2014-08-20 MED ORDER — VITAMIN B-1 100 MG PO TABS
100.0000 mg | ORAL_TABLET | Freq: Every day | ORAL | Status: DC
  Administered 2014-08-20 – 2014-08-27 (×8): 100 mg via ORAL
  Filled 2014-08-20 (×12): qty 1

## 2014-08-20 NOTE — BHH Group Notes (Signed)
[place] LCSW Group Therapy 08/20/2014  1:15 PM Type of Therapy: Group Therapy Participation Level: Active  Participation Quality: Attentive, Sharing and Supportive  Affect: Depressed and flat  Cognitive: Alert and Oriented  Insight: Developing/Improving and Engaged  Engagement in Therapy: Developing/Improving and Engaged  Modes of Intervention: Clarification, Confrontation, Discussion, Education, Exploration, Limit-setting, Orientation, Problem-solving, Rapport Building, Art therapist, Socialization and Support  Summary of Progress/Problems: The topic for group today was emotional regulation. This group focused on both positive and negative emotion identification and allowed group members to process ways to identify feelings, regulate negative emotions, and find healthy ways to manage internal/external emotions. Group members were asked to reflect on a time when their reaction to an emotion led to a negative outcome and explored how alternative responses using emotion regulation would have benefited them. Group members were also asked to discuss a time when emotion regulation was utilized when a negative emotion was experienced. Patient discussed his need to manage his self-doubt. Patient reports that he would like to build his self-confidence in his decision making but has difficulty due to feeling alone. He reports the need to "let people in". CSW and other group members provided patient with emotional support and encouragement.   Tilden Fossa, MSW, [place] Worker [facility] [phone]

## 2014-08-20 NOTE — H&P (Signed)
Psychiatric Admission Assessment Adult  Patient Identification: Fernando Carr MRN:  [id number] Date of Evaluation:  08/20/2014 Chief Complaint:  MAJOR DEPRESSIVE DISORDER, RECURRENT, SEVERE  Principal Diagnosis:  Depression, major, severe Diagnosis:   Patient Active Problem List   Diagnosis Date Noted  . Recurrent major depression-severe [F33.2] 08/19/2014  . Severe depression [F32.9]   . Major depressive disorder, recurrent, severe without psychotic features [F33.2]   . AAA (abdominal aortic aneurysm) [I71.4] 07/07/2014  . AKI (acute kidney injury) [N17.9] 03/10/2014  . Dehydration [E86.0] 03/10/2014  . FTT (failure to thrive) in adult [R62.7] 03/10/2014  . 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 Sinus bradycardia [R00.1] 03/10/2014  . HTN (hypertension) [I10] 03/10/2014  . Hypotension, postural [I95.1] 03/09/2014  . CRD (chronic renal disease), stage III [N18.3] 01/09/2014  . Abdominal aortic aneurysm [I71.4] 12/20/2011  . Bipolar 1 disorder [F31.9] 10/24/2011  . Hyperlipemia [E78.5] 10/10/2011   History of Present Illness:  Fernando Carr is a 74 yo male patient who came in after increased depression.   Patient was seen by Dr Adele Schilder yesterday and was sent to [facility] for admission. Patient is already taking Lexapro and Risperdal Prescribed by Dr Adele Schilder and stated that he is still not feeling better. He reported feeling suicidal but not today.  Patient admitted to attempting suicide in 2006 by drinking Antifreez and was hospitalized in our [facility]. Patient lives alone and reported good sleep but poor appetite. When asked what triggered his worsening depression, "I'm just lonely".  Patient reported poor energy and stated he is not able to to do his grocery shopping.  He does however, have neighbors that he is in contact with him.  He was actually with the neighbor who was cutting his hair when he reported a brief syncopal episode, "I passed out for a minute."  He has a one daughter who  just moved to [place].  He denies SI/HI/AVH.  He states he had a DUI when he was in his 36's.  HPI Elements: Location: Major depressive dioorder, Recurrent, severe without Psychotic features.. Quality: Depressed feelings, poor energy and appetite, occasional suicidal feelings. Severity: severe. Timing: Acute, on going. Duration: Chronic mental and medical illness.. Context: Seeking treatment for depression..  Associated Signs/Symptoms: Depression Symptoms:  depressed mood, anhedonia, hopelessness, (Hypo) Manic Symptoms:  NA Anxiety Symptoms:  NA Psychotic Symptoms:  NA PTSD Symptoms: NA Total Time spent with patient: 30 minutes  Past Medical History:  Past Medical History  Diagnosis Date  . High cholesterol   . Bipolar disorder   . AAA (abdominal aortic aneurysm)   . Depression   . Renal insufficiency     chronic renal   . Suicide attempt aug. 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 AAA (abdominal aortic aneurysm)     Past Surgical History  Procedure Laterality Date  . Knee arthroscopy Left 1972  . Abdominal aortic aneurysm repair N/A 07/07/2014    Procedure: ABDOMINAL AORTIC ANEURYSM REPAIR;  Surgeon: Rosetta Posner, MD;  Location: [facility] OR;  Service: Vascular;  Laterality: N/A;   Family History:  Family History  Problem Relation Age of Onset  . Alcohol abuse Mother   . Alcohol abuse Father   . Suicidality Paternal Uncle   . Suicidality Cousin   . Depression Daughter    Social History:  History  Alcohol Use No     History  Drug Use No    History   Social History  . Marital Status:  Single    Spouse Name: N/A    Number of Children: N/A  . Years of Education: N/A   Social History Main Topics  . Smoking status: Former Smoker -- 1.00 packs/day for 40 years    Types: Cigarettes    Quit date: 03/11/2014  . Smokeless tobacco: Never Used  . Alcohol Use: No  . Drug Use: No  . Sexual Activity: No   Other Topics Concern  . None    Social History Narrative   Additional Social History: He has one grown daughter who moved to [place], a older sister in [place].  He was a Phys Ed teacher years ago.  He lives in [place] and was born in [place].    Musculoskeletal: Strength & Muscle Tone: within normal limits Gait & Station: normal Patient leans: N/A  Past Psychiatric History: Hospitalizations:  Major Depression, Suicide attempt  Outpatient Care:  Dr Adele Schilder, [facility]  Substance Abuse Care:  Denies  Self-Mutilation:  Denies  Suicidal Attempts:  History of  Violent Behaviors:  Denies   Psychiatric Specialty Exam: Physical Exam  Vitals reviewed. Psychiatric: He has a normal mood and affect. His behavior is normal. Judgment and thought content normal.    Review of Systems  Constitutional: Negative.   HENT: Negative.   Eyes: Negative.   Respiratory: Negative.   Cardiovascular: Negative.   Gastrointestinal: Negative.   Genitourinary: Negative.   Musculoskeletal: Negative.   Skin: Negative.   Neurological: Negative.   Endo/Heme/Allergies: Negative.   Psychiatric/Behavioral: Positive for depression. Negative for suicidal ideas, hallucinations, memory loss and substance abuse. The patient is nervous/anxious. The patient does not have insomnia.     Blood pressure 133/69, pulse 66, temperature 98.4 F (36.9 C), temperature source Oral, resp. rate 15, height 5' 11.5" (1.816 m), weight 91.173 kg (201 lb), SpO2 99 %.Body mass index is 27.65 kg/(m^2).   General Appearance: Casual  Eye Contact:: Poor  Speech: Clear and Coherent and Slow  Volume: Decreased  Mood: Depressed and Hopeless  Affect: Congruent, Depressed and Flat  Thought Process: Coherent, Goal Directed and Intact  Orientation: Full (Time, Place, and Person)  Thought Content: WDL  Suicidal Thoughts: No  Homicidal Thoughts: No  Memory: Immediate; Good Recent; Good Remote; Good  Judgement: Fair  Insight:  Good  Psychomotor Activity: Decreased and Psychomotor Retardation  Concentration: Good  Recall: NA  Fund of Knowledge:Good  Language: Good  Akathisia: NA  Handed: Right  AIMS (if indicated):    Assets: Desire for Improvement  Sleep:  6.75 hrs   Risk to Self: Is patient at risk for suicide?: Yes Risk to Others:   Prior Inpatient Therapy:  Yes, [place] suicide attempt 2006 Prior Outpatient Therapy:  NA  Alcohol Screening: 1. How often do you have a drink containing alcohol?: Never 9. Have you or someone else been injured as a result of your drinking?: No 10. Has a relative or friend or a doctor or another health worker been concerned about your drinking or suggested you cut down?: No Alcohol Use Disorder Identification Test Final Score (AUDIT): 0 Brief Intervention: AUDIT score less than 7 or less-screening does not suggest unhealthy drinking-brief intervention not indicated  Allergies:  No Known Allergies Lab Results:  Results for orders placed or performed during the hospital encounter of 08/19/14 (from the past 48 hour(s))  Urinalysis, Routine w reflex microscopic     Status: None   Collection Time: 08/19/14  5:45 PM  Result Value Ref Range   Color, Urine YELLOW YELLOW  APPearance CLEAR CLEAR   Specific Gravity, Urine 1.022 1.005 - 1.030   pH 5.5 5.0 - 8.0   Glucose, UA NEGATIVE NEGATIVE mg/dL   Hgb urine dipstick NEGATIVE NEGATIVE   Bilirubin Urine NEGATIVE NEGATIVE   Ketones, ur NEGATIVE NEGATIVE mg/dL   Protein, ur NEGATIVE NEGATIVE mg/dL   Urobilinogen, UA 1.0 0.0 - 1.0 mg/dL   Nitrite NEGATIVE NEGATIVE   Leukocytes, UA NEGATIVE NEGATIVE    Comment: MICROSCOPIC NOT DONE ON URINES WITH NEGATIVE PROTEIN, BLOOD, LEUKOCYTES, NITRITE, OR GLUCOSE <1000 mg/dL. Performed at [facility]   Urine rapid drug screen (hosp performed)     Status: None   Collection Time: 08/19/14  5:45 PM  Result Value Ref Range   Opiates NONE DETECTED NONE  DETECTED   Cocaine NONE DETECTED NONE DETECTED   Benzodiazepines NONE DETECTED NONE DETECTED   Amphetamines NONE DETECTED NONE DETECTED   Tetrahydrocannabinol NONE DETECTED NONE DETECTED   Barbiturates NONE DETECTED NONE DETECTED    Comment:        DRUG SCREEN FOR MEDICAL PURPOSES ONLY.  IF CONFIRMATION IS NEEDED FOR ANY PURPOSE, NOTIFY LAB WITHIN 5 DAYS.        LOWEST DETECTABLE LIMITS FOR URINE DRUG SCREEN Drug Class       Cutoff (ng/mL) Amphetamine      1000 Barbiturate      200 Benzodiazepine   536 Tricyclics       468 Opiates          300 Cocaine          300 THC              50 Performed at [facility]    Current Medications: Current Facility-Administered Medications  Medication Dose Route Frequency Provider Last Rate Last Dose  . escitalopram (LEXAPRO) tablet 20 mg  20 mg Oral Daily Delfin Gant, NP   20 mg at 08/20/14 0810  . fenofibrate tablet 54 mg  54 mg Oral Daily Delfin Gant, NP   54 mg at 08/20/14 0810  . multivitamin with minerals tablet 1 tablet  1 tablet Oral Daily Delfin Gant, NP   1 tablet at 08/20/14 0810  . pravastatin (PRAVACHOL) tablet 20 mg  20 mg Oral q1800 Delfin Gant, NP   20 mg at 08/19/14 1843  . risperiDONE (RISPERDAL) tablet 1 mg  1 mg Oral QHS Delfin Gant, NP   1 mg at 08/19/14 2200  . thiamine (VITAMIN B-1) tablet 100 mg  100 mg Oral Daily Janett Labella, NP       PTA Medications: Prescriptions prior to admission  Medication Sig Dispense Refill Last Dose  . aspirin EC 81 MG tablet Take 1 tablet (81 mg total) by mouth daily. (Patient not taking: Reported on 08/18/2014) 30 tablet 0 Not Taking at Unknown time  . escitalopram (LEXAPRO) 20 MG tablet Take 1 tablet (20 mg total) by mouth daily. 30 tablet 1 08/18/2014 at Unknown time  . fenofibrate micronized (LOFIBRA) 67 MG capsule Take 67 mg by mouth daily.    Past Week at Unknown time  . fludrocortisone (FLORINEF) 0.1 MG tablet Take 1 tablet  (0.1 mg total) by mouth daily. (Patient not taking: Reported on 08/18/2014) 14 tablet 0 Not Taking at Unknown time  . Multiple Vitamins-Minerals (MULTIVITAMIN WITH MINERALS) tablet Take 1 tablet by mouth daily.   Past Week at Unknown time  . oxyCODONE-acetaminophen (PERCOCET/ROXICET) 5-325 MG per tablet Take 1-2 tablets by mouth  every 6 (six) hours as needed for moderate pain. (Patient not taking: Reported on 08/05/2014) 30 tablet 0 Not Taking at Unknown time  . risperiDONE (RISPERDAL) 1 MG tablet Take 1 tablet (1 mg total) by mouth at bedtime. 30 tablet 1 08/17/2014 at Unknown time  . simvastatin (ZOCOR) 20 MG tablet Take 10 mg by mouth daily.    08/17/2014 at Unknown time    Previous Psychotropic Medications: Yes (Lexapro and Risperdal)  Substance Abuse History in the last 12 months:  No.   Consequences of Substance Abuse: na  Results for orders placed or performed during the hospital encounter of 08/19/14 (from the past 72 hour(s))  Urinalysis, Routine w reflex microscopic     Status: None   Collection Time: 08/19/14  5:45 PM  Result Value Ref Range   Color, Urine YELLOW YELLOW   APPearance CLEAR CLEAR   Specific Gravity, Urine 1.022 1.005 - 1.030   pH 5.5 5.0 - 8.0   Glucose, UA NEGATIVE NEGATIVE mg/dL   Hgb urine dipstick NEGATIVE NEGATIVE   Bilirubin Urine NEGATIVE NEGATIVE   Ketones, ur NEGATIVE NEGATIVE mg/dL   Protein, ur NEGATIVE NEGATIVE mg/dL   Urobilinogen, UA 1.0 0.0 - 1.0 mg/dL   Nitrite NEGATIVE NEGATIVE   Leukocytes, UA NEGATIVE NEGATIVE    Comment: MICROSCOPIC NOT DONE ON URINES WITH NEGATIVE PROTEIN, BLOOD, LEUKOCYTES, NITRITE, OR GLUCOSE <1000 mg/dL. Performed at [facility]   Urine rapid drug screen (hosp performed)     Status: None   Collection Time: 08/19/14  5:45 PM  Result Value Ref Range   Opiates NONE DETECTED NONE DETECTED   Cocaine NONE DETECTED NONE DETECTED   Benzodiazepines NONE DETECTED NONE DETECTED   Amphetamines NONE DETECTED  NONE DETECTED   Tetrahydrocannabinol NONE DETECTED NONE DETECTED   Barbiturates NONE DETECTED NONE DETECTED    Comment:        DRUG SCREEN FOR MEDICAL PURPOSES ONLY.  IF CONFIRMATION IS NEEDED FOR ANY PURPOSE, NOTIFY LAB WITHIN 5 DAYS.        LOWEST DETECTABLE LIMITS FOR URINE DRUG SCREEN Drug Class       Cutoff (ng/mL) Amphetamine      1000 Barbiturate      200 Benzodiazepine   629 Tricyclics       528 Opiates          300 Cocaine          300 THC              50 Performed at [facility]     Observation Level/Precautions:  15 minute checks  Laboratory:  per ED  Psychotherapy:  Group therapy  Medications:  As per medlist  Consultations:  As needed  Discharge Concerns:  Safety, help with ADL's  Estimated LOS:  5-7 days  Other:     Psychological Evaluations: Yes   Treatment Plan Summary: Daily contact with patient to assess and evaluate symptoms and progress in treatment and Medication management  Medical Decision Making:  Established Problem, Stable/Improving (1), Review or order clinical lab tests (1), Discuss test with performing physician (1), Review and summation of old records (2) and Review of Medication Regimen & Side Effects (2)  I certify that inpatient services furnished can reasonably be expected to improve the patient's condition.   Bryker Fletchall, [place], AGNP-BC 1/20/20161:29 PM

## 2014-08-20 NOTE — Progress Notes (Signed)
Patient ID: Fernando Carr, male   DOB: 06-13-1941, 74 y.o.   MRN: [id number] PER STATE REGULATIONS 482.30  THIS CHART WAS REVIEWED FOR MEDICAL NECESSITY WITH RESPECT TO THE PATIENT'S ADMISSION/DURATION OF STAY.  NEXT REVIEW DATE: 08/23/14  Roma Schanz, RN, BSN CASE MANAGER

## 2014-08-20 NOTE — Progress Notes (Signed)
D: Patient's affect appropriate to circumstance and mood is depressed. He reported on the self inventory sheet that his sleep and appetite are both fair, normal energy level and good ability to concentrate. Patient rates depression, feelings of hopelessness and anxiety "4". He's participating in some of the group sessions today; however at this time the patient is resting in bed. Patient is compliant with current medication regimen.  A: Support and encouragement provided to patient. Scheduled medications given per MD orders. Maintain Q15 minute checks for safety.  R: Patient receptive. Denies SI/HI and AVH. Patient remains safe on the hall.

## 2014-08-20 NOTE — BHH Group Notes (Signed)
   [facility] LCSW Aftercare Discharge Planning Group Note  08/20/2014  8:45 AM   Participation Quality: Alert, Appropriate and Oriented  Mood/Affect: Depressed and Flat  Depression Rating: 5  Anxiety Rating: 5  Thoughts of Suicide: Pt denies SI/HI  Will you contract for safety? Yes  Current AVH: Pt denies  Plan for Discharge/Comments: Pt attended discharge planning group and actively participated in group. CSW provided pt with today's workbook. Patient reports feeling "okay" today. He reports being hospitalized for depression and SI. He plans to return to his apartment at discharge and follow up with Dr. Adele Schilder outpatient, he is considering therapy referral at this time.  Transportation Means: Pt reports access to transportation  Supports: No supports mentioned at this time  Tilden Fossa, MSW, [place] Social Worker [employer] [phone]

## 2014-08-20 NOTE — BHH Suicide Risk Assessment (Signed)
[facility] Admission Suicide Risk Assessment   Nursing information obtained from:  Patient Demographic factors:  Male, Age 74 or older, Caucasian Current Mental Status:  Suicidal ideation indicated by patient Loss Factors:  Decline in physical health Historical Factors:  Prior suicide attempts Risk Reduction Factors:  NA Total Time spent with patient: 30 minutes Principal Problem: MDD (major depressive disorder), recurrent severe, without psychosis Diagnosis:   Patient Active Problem List   Diagnosis Date Noted  . MDD (major depressive disorder), recurrent severe, without psychosis [F33.2] 08/20/2014  . Recurrent major depression-severe [F33.2] 08/19/2014  . Severe depression [F32.9]   . Major depressive disorder, recurrent, severe without psychotic features [F33.2]   . AAA (abdominal aortic aneurysm) [I71.4] 07/07/2014  . AKI (acute kidney injury) [N17.9] 03/10/2014  . Dehydration [E86.0] 03/10/2014  . FTT (failure to thrive) in adult [R62.7] 03/10/2014  . 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 Sinus bradycardia [R00.1] 03/10/2014  . HTN (hypertension) [I10] 03/10/2014  . Hypotension, postural [I95.1] 03/09/2014  . CRD (chronic renal disease), stage III [N18.3] 01/09/2014  . Abdominal aortic aneurysm [I71.4] 12/20/2011  . Bipolar 1 disorder [F31.9] 10/24/2011  . Hyperlipemia [E78.5] 10/10/2011     Continued Clinical Symptoms:  Alcohol Use Disorder Identification Test Final Score (AUDIT): 0 The "Alcohol Use Disorders Identification Test", Guidelines for Use in Primary Care, Second Edition.  World Pharmacologist [facility]). Score between 0-7:  no or low risk or alcohol related problems. Score between 8-15:  moderate risk of alcohol related problems. Score between 16-19:  high risk of alcohol related problems. Score 20 or above:  warrants further diagnostic evaluation for alcohol dependence and treatment.   CLINICAL FACTORS:   Depression:   Anhedonia Previous  Psychiatric Diagnoses and Treatments Medical Diagnoses and Treatments/Surgeries   Musculoskeletal: Strength & Muscle Tone: within normal limits Gait & Station: normal Patient leans: N/A  Psychiatric Specialty Exam: Physical Exam  Review of Systems  Constitutional: Negative.   HENT: Negative.   Eyes: Negative.   Respiratory: Negative.   Cardiovascular: Negative.   Gastrointestinal: Negative.   Genitourinary: Negative.   Musculoskeletal: Positive for myalgias.  Skin: Negative.   Neurological: Negative.   Psychiatric/Behavioral: Positive for depression. The patient is nervous/anxious.     Blood pressure 133/69, pulse 66, temperature 98.4 F (36.9 C), temperature source Oral, resp. rate 15, height 5' 11.5" (1.816 m), weight 91.173 kg (201 lb), SpO2 99 %.Body mass index is 27.65 kg/(m^2).  General Appearance: Fairly Groomed  Engineer, water::  Fair  Speech:  Clear and Coherent  Volume:  Normal  Mood:  Anxious and Depressed  Affect:  Depressed  Thought Process:  Goal Directed  Orientation:  Full (Time, Place, and Person)  Thought Content:  Rumination  Suicidal Thoughts:  No  Homicidal Thoughts:  No  Memory:  Immediate;   Fair Recent;   Fair Remote;   Fair  Judgement:  Fair  Insight:  Fair  Psychomotor Activity:  Increased and Tremor  Concentration:  Poor  Recall:  Poor  Fund of Knowledge:Fair  Language: Fair  Akathisia:  No  Handed:  Right  AIMS (if indicated):     Assets:  Communication Skills Desire for Improvement  Sleep:  Number of Hours: 6.75  Cognition: WNL  ADL's:  Intact     COGNITIVE FEATURES THAT CONTRIBUTE TO RISK:  Polarized thinking and Thought constriction (tunnel vision)    SUICIDE RISK:   Minimal: No identifiable suicidal ideation.    PLAN OF CARE:.Patient with past hx of  depression. Patient's diagnosis recently changed to Bipolar disorder per outpatient psychiatrist . However patient not able to verbalize any bipolar disorder sx. Does reports  anxiety as well as racing thoughts on and off. Denies any psychotic sx. Will start him on Zoloft 25 mg po daily for depression. Will DC Lexapro. Will DC Risperdal, since he denies psychosis or mood lability. Will be cautious when starting antipsychotics due to his age . Will monitor on the unit. Will start Amantadine 100 mg po bid for tremors.  Medical Decision Making:  Review or order clinical lab tests (1), Review and summation of old records (2), Order AIMS Test (2), Established Problem, Worsening (2), New Problem, with no additional work-up planned (3), Review or order medicine tests (1), Review of Medication Regimen & Side Effects (2) and Review of New Medication or Change in Dosage (2)  I certify that inpatient services furnished can reasonably be expected to improve the patient's condition.   Francelia Mclaren MD 08/20/2014, 4:32 PM

## 2014-08-21 LAB — TSH: TSH: 0.321 u[IU]/mL — ABNORMAL LOW (ref 0.350–4.500)

## 2014-08-21 MED ORDER — LOPERAMIDE HCL 2 MG PO CAPS
4.0000 mg | ORAL_CAPSULE | Freq: Once | ORAL | Status: AC
  Administered 2014-08-21: 4 mg via ORAL
  Filled 2014-08-21 (×2): qty 2

## 2014-08-21 MED ORDER — LOPERAMIDE HCL 2 MG PO CAPS
2.0000 mg | ORAL_CAPSULE | ORAL | Status: DC | PRN
  Administered 2014-08-21: 2 mg via ORAL
  Filled 2014-08-21 (×2): qty 1

## 2014-08-21 NOTE — Progress Notes (Signed)
[facility] MD Progress Note  08/21/2014 12:34 PM [place]  MRN:  [id number] Subjective: Patient states "I am Ok ,I had not passed my bowel properly after the surgery ,may be that is why I had loose stools, but now I feel better.'  Objective : Patient seen and chart reviewed. Patient discussed with staff. Patient with few episodes of diarrhea . Patient however when seen reported no concerns and had relief of his sx. Patient denied any depressive sx today. Patient was able to take his breakfast this AM. Reports sleep and appetite as fair. Continues to have tremors of his BL hands, possibly induced by neuroleptics.Patient with no psychotic sx or delusions. Patient denied any mood lability . Patient has concerns about living by self ,needs assistance with shopping and cooking. CSW will attempt to contact sister and look at resources available,   Principal Problem: MDD (major depressive disorder), recurrent severe, without psychosis Diagnosis:  Primary Psychiatric Diagnosis: MDD ,recurrent severe without psychosis   Secondary Psychiatric Diagnosis: Neuroleptic induced parkinsonism   Non Psychiatric Diagnosis: See PMH     Patient Active Problem List   Diagnosis Date Noted  . MDD (major depressive disorder), recurrent severe, without psychosis [F33.2] 08/20/2014  . 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 Severe depression [F32.9]   . Major depressive disorder, recurrent, severe without psychotic features [F33.2]   . AAA (abdominal aortic aneurysm) [I71.4] 07/07/2014  . AKI (acute kidney injury) [N17.9] 03/10/2014  . Dehydration [E86.0] 03/10/2014  . FTT (failure to thrive) in adult [R62.7] 03/10/2014  . 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 Sinus bradycardia [R00.1] 03/10/2014  . HTN (hypertension) [I10] 03/10/2014  . Hypotension, postural [I95.1] 03/09/2014  . CRD (chronic renal disease), stage III [N18.3] 01/09/2014  . Abdominal aortic aneurysm [I71.4] 12/20/2011  .  Bipolar 1 disorder [F31.9] 10/24/2011  . Hyperlipemia [E78.5] 10/10/2011   Total Time spent with patient: 30 minutes   Past Medical History:  Past Medical History  Diagnosis Date  . High cholesterol   . Bipolar disorder   . AAA (abdominal aortic aneurysm)   . Depression   . Renal insufficiency     chronic renal   . Suicide attempt aug. 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 AAA (abdominal aortic aneurysm)     Past Surgical History  Procedure Laterality Date  . Knee arthroscopy Left 1972  . Abdominal aortic aneurysm repair N/A 07/07/2014    Procedure: ABDOMINAL AORTIC ANEURYSM REPAIR;  Surgeon: Rosetta Posner, MD;  Location: [facility] OR;  Service: Vascular;  Laterality: N/A;   Family History:  Family History  Problem Relation Age of Onset  . Alcohol abuse Mother   . Alcohol abuse Father   . Suicidality Paternal Uncle   . Suicidality Cousin   . Depression Daughter    Social History:  History  Alcohol Use No     History  Drug Use No    History   Social History  . Marital Status: Single    Spouse Name: N/A    Number of Children: N/A  . Years of Education: N/A   Social History Main Topics  . Smoking status: Former Smoker -- 1.00 packs/day for 40 years    Types: Cigarettes    Quit date: 03/11/2014  . Smokeless tobacco: Never Used  . Alcohol Use: No  . Drug Use: No  . Sexual Activity: No   Other Topics Concern  . None   Social History Narrative  Additional History:    Sleep: Fair  Appetite:  Fair     Musculoskeletal: Strength & Muscle Tone: within normal limits Gait & Station: normal Patient leans: N/A   Psychiatric Specialty Exam: Physical Exam  Review of Systems  Constitutional: Negative.   HENT: Negative.   Eyes: Negative.   Respiratory: Negative.   Cardiovascular: Negative.   Gastrointestinal: Positive for diarrhea.  Genitourinary: Negative.   Musculoskeletal: Negative.   Skin: Negative.   Neurological: Negative.    Psychiatric/Behavioral: Positive for depression.    Blood pressure 126/31, pulse 60, temperature 97.6 F (36.4 C), temperature source Oral, resp. rate 18, height 5' 11.5" (1.816 m), weight 91.173 kg (201 lb), SpO2 99 %.Body mass index is 27.65 kg/(m^2).  General Appearance: Casual  Eye Contact::  Fair  Speech:  Clear and Coherent  Volume:  Normal  Mood:  Depressed  Affect:  Restricted  Thought Process:  Coherent  Orientation:  Full (Time, Place, and Person)  Thought Content:  WDL  Suicidal Thoughts:  No  Homicidal Thoughts:  No  Memory:  Immediate;   Fair Recent;   Fair Remote;   Fair  Judgement:  Fair  Insight:  Shallow  Psychomotor Activity:  Tremor  Concentration:  Fair  Recall:  [employer] of Knowledge:Fair  Language: Fair  Akathisia:  No  Handed:  Right  AIMS (if indicated):     Assets:  Communication Skills Desire for Improvement  ADL's:  Intact  Cognition: WNL  Sleep:  Number of Hours: 4.5     Current Medications: Current Facility-Administered Medications  Medication Dose Route Frequency Provider Last Rate Last Dose  . amantadine (SYMMETREL) capsule 100 mg  100 mg Oral BID Ursula Alert, MD   100 mg at 08/21/14 0802  . fenofibrate tablet 54 mg  54 mg Oral Daily Delfin Gant, NP   54 mg at 08/21/14 0802  . loperamide (IMODIUM) capsule 2 mg  2 mg Oral PRN Laverle Hobby, PA-C   2 mg at 08/21/14 0802  . multivitamin with minerals tablet 1 tablet  1 tablet Oral Daily Delfin Gant, NP   1 tablet at 08/21/14 0802  . pravastatin (PRAVACHOL) tablet 20 mg  20 mg Oral q1800 Delfin Gant, NP   20 mg at 08/20/14 1702  . sertraline (ZOLOFT) tablet 25 mg  25 mg Oral Daily Ursula Alert, MD   25 mg at 08/21/14 0802  . thiamine (VITAMIN B-1) tablet 100 mg  100 mg Oral Daily Sheila May Agustin, NP   100 mg at 08/21/14 6283    Lab Results:  Results for orders placed or performed during the hospital encounter of 08/19/14 (from the past 48 hour(s))   Urinalysis, Routine w reflex microscopic     Status: None   Collection Time: 08/19/14  5:45 PM  Result Value Ref Range   Color, Urine YELLOW YELLOW   APPearance CLEAR CLEAR   Specific Gravity, Urine 1.022 1.005 - 1.030   pH 5.5 5.0 - 8.0   Glucose, UA NEGATIVE NEGATIVE mg/dL   Hgb urine dipstick NEGATIVE NEGATIVE   Bilirubin Urine NEGATIVE NEGATIVE   Ketones, ur NEGATIVE NEGATIVE mg/dL   Protein, ur NEGATIVE NEGATIVE mg/dL   Urobilinogen, UA 1.0 0.0 - 1.0 mg/dL   Nitrite NEGATIVE NEGATIVE   Leukocytes, UA NEGATIVE NEGATIVE    Comment: MICROSCOPIC NOT DONE ON URINES WITH NEGATIVE PROTEIN, BLOOD, LEUKOCYTES, NITRITE, OR GLUCOSE <1000 mg/dL. Performed at [facility]   Urine rapid drug screen (  hosp performed)     Status: None   Collection Time: 08/19/14  5:45 PM  Result Value Ref Range   Opiates NONE DETECTED NONE DETECTED   Cocaine NONE DETECTED NONE DETECTED   Benzodiazepines NONE DETECTED NONE DETECTED   Amphetamines NONE DETECTED NONE DETECTED   Tetrahydrocannabinol NONE DETECTED NONE DETECTED   Barbiturates NONE DETECTED NONE DETECTED    Comment:        DRUG SCREEN FOR MEDICAL PURPOSES ONLY.  IF CONFIRMATION IS NEEDED FOR ANY PURPOSE, NOTIFY LAB WITHIN 5 DAYS.        LOWEST DETECTABLE LIMITS FOR URINE DRUG SCREEN Drug Class       Cutoff (ng/mL) Amphetamine      1000 Barbiturate      200 Benzodiazepine   161 Tricyclics       096 Opiates          300 Cocaine          300 THC              50 Performed at [facility]   TSH     Status: Abnormal   Collection Time: 08/21/14  7:05 AM  Result Value Ref Range   TSH 0.321 (L) 0.350 - 4.500 uIU/mL    Comment: Performed at [facility]    Physical Findings: AIMS: Facial and Oral Movements Muscles of Facial Expression: None, normal Lips and Perioral Area: None, normal Jaw: None, normal Tongue: None, normal,Extremity Movements Upper (arms, wrists, hands, fingers): None,  normal Lower (legs, knees, ankles, toes): None, normal, Trunk Movements Neck, shoulders, hips: None, normal, Overall Severity Severity of abnormal movements (highest score from questions above): None, normal Incapacitation due to abnormal movements: None, normal Patient's awareness of abnormal movements (rate only patient's report): No Awareness, Dental Status Current problems with teeth and/or dentures?: No Does patient usually wear dentures?: No  CIWA:    COWS:     Assessment: Patient is a 74 year old CM with hx of depression versus Bipolar disorder. Based on my evaluation ,pt likely has unipolar depression , and not Bipolar. Will start a new antidepressant medication ,since he has been on Lexapro for a long time.   Treatment Plan Summary: Daily contact with patient to assess and evaluate symptoms and progress in treatment and Medication management  Patient with past hx of depression. Patient's diagnosis recently changed to Bipolar disorder per outpatient psychiatrist . However patient not able to verbalize any bipolar disorder sx. Does reports anxiety as well as racing thoughts on and off. Denies any psychotic sx. Reviewed outpatient notes at [place], [place].Patient with recent surgery ,as well as decompensation after that. Patient started on Zoloft yesterday. Will continue the same dose. Patient with few episodes of diarrhea,however unknown if this was medication induced. Labs ordered for Cdiff ,stool culture -will await results. Will continue amantadine 100 mg po bid for tremors.DC risperdal due to hand tremors as well as he denies any psychosis. Patient is elderly and antipsychotics should be used with caution. Will continue to monitor and reassess for the need for an antipsychotic . CSW will work on disposition. Patient to be monitored for medical issues, VS reviewed. Pt to be encouraged to take po fluids.      Medical Decision Making:  Established Problem, Stable/Improving (1),  New problem, with additional work up planned, Review or order medicine tests (1), Review of Medication Regimen & Side Effects (2) and Review of New Medication or Change in Dosage (2)  Problem Points:  Established problem, stable/improving (1), New problem, with additional work-up planned (4), Review of last therapy session (1) and Review of psycho-social stressors (1) Data Points:  Review or order clinical lab tests (1) Review and summation of old records (2) Review of medication regiment & side effects (2) Review of new medications or change in dosage (2)    Atalie Oros md 08/21/2014, 12:34 PM

## 2014-08-21 NOTE — Progress Notes (Signed)
D: Patient is alert and oriented. Pt's mood and affect is depressed and flat. Pt's eye contact is fair. Pt denies SI/HI and AVH. Pt complains of diarrhea this am. Pt experiencing orthostatic hypotension (See docflowsheets-vitals); pt denies any symptoms. Pt reports his goal for the day is "going home." Pt rates depression and anxiety 3/10. Pt rates hopelessness 2/10. Pt made aware that stool sample order placed, pt denies having any additional bowel movements thus far. A: Pt encouraged to increase fluids. NP, May made aware of pt's BP concerns; Fall prevention reviewed with pt. Encouragement/Support provided to pt. PRN medications administered for diarrhea per providers orders (See MAR). Scheduled medications administered per providers orders (See MAR). 15 minute checks continued per protocol for patient safety.  R: Patient cooperative and receptive to nursing interventions.

## 2014-08-21 NOTE — Progress Notes (Addendum)
D: Pt was laying in bed during the assessment. Informed the writer that he had a "decent day". Pt voiced no questions or concerns for the writer at the time of the assessment.   A:  Support and encouragement was offered. 15 min checks continued for safety.  R: Pt remains safe.     0230  08/21/14  Pt had a bout of diarrhea. Stated he was trying to get to the bathroom, but was unable to make it to the toilet in time. Feces was on the toilet, floor and in the shower. Per pt he showered however, even after cleaning the room the writer and mht noticed a strong smell of feces. Writer gave the pt prn immodium and instructed pt to shower and put on clean scrubs that were provided.

## 2014-08-21 NOTE — Clinical Social Work Note (Signed)
CSW attempted to contact sister Tula Nakayama [phone] but no answer, CSW not prompted to leave voicemail. CSW will attempt to contact sister again at a later time.  Tilden Fossa, MSW, [place] Worker [facility] [phone]

## 2014-08-21 NOTE — Progress Notes (Signed)
D: Client in bed most of this shift, reports depression unable to rate on 1-10 scale "not to bad" reports harmful thoughts "sometimes" but contracts for safety. A: Writer introduced self to client provided emotional support, encouraged client to report any discomfort or diarrhea to staff. Staff will monitor q27min for safety. R: client is safe on the unit, denies diarrhea, refused group.

## 2014-08-21 NOTE — Significant Event (Cosign Needed)
Patient with multiple loose stools with recent protracted in -patient hospital admission prior to arrival at [facility]. Checking stools for Clostridium difficile, stool culture and ova and parasites. Patient will have a no roomate order at this time. Rx Imodium as directed   On call : [name] MD
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
## 2014-08-21 NOTE — Plan of Care (Signed)
Problem: Ineffective individual coping Goal: STG: Patient will remain free from self harm Outcome: Progressing Patient remains free from self harm. 15 minute checks continued per protocol for patient safety.   Problem: Alteration in mood Goal: LTG-Patient reports reduction in suicidal thoughts (Patient reports reduction in suicidal thoughts and is able to verbalize a safety plan for whenever patient is feeling suicidal)  Outcome: Progressing Patient denies having any suicidal thoughts today.  Problem: Diagnosis: Increased Risk For Suicide Attempt Goal: STG-Patient Will Comply With Medication Regime Outcome: Progressing Patient has been adherent to medication regimen with ease.

## 2014-08-21 NOTE — [facility] Suicide Risk Assessment (Signed)
[facility] INPATIENT:  Family/Significant Other Suicide Prevention Education  Suicide Prevention Education:  Education Completed; sister Tula Nakayama [phone],  (name of family member/significant other) has been identified by the patient as the family member/significant other with whom the patient will be residing, and identified as the person(s) who will aid the patient in the event of a mental health crisis (suicidal ideations/suicide attempt).  With written consent from the patient, the family member/significant other has been provided the following suicide prevention education, prior to the and/or following the discharge of the patient.  The suicide prevention education provided includes the following:  Suicide risk factors  Suicide prevention and interventions  National Suicide Hotline telephone number  [facility] assessment telephone number  [facility] Emergency Assistance [facility] and/or Residential Mobile Crisis Unit telephone number  Request made of family/significant other to:  Remove weapons (e.g., guns, rifles, knives), all items previously/currently identified as safety concern.    Remove drugs/medications (over-the-counter, prescriptions, illicit drugs), all items previously/currently identified as a safety concern.  The family member/significant other verbalizes understanding of the suicide prevention education information provided.  The family member/significant other agrees to remove the items of safety concern listed above.  Teletha Petrea, Casimiro Needle 08/21/2014, 4:43 PM

## 2014-08-21 NOTE — Progress Notes (Signed)
Recreation Therapy Notes  Animal-Assisted Activity/Therapy (AAA/T) Program Checklist/Progress Notes Patient Eligibility Criteria Checklist & Daily Group note for Rec Tx Intervention  Date: 01.21.2016 Time: 2:45pm Location: [facility] [employer]   AAA/T Program Assumption of Risk Form signed by Patient/ or Parent Legal Guardian yes  Patient is free of allergies or sever asthma yes  Patient reports no fear of animals yes  Patient reports no history of cruelty to animals yes  Patient understands his/her participation is voluntary yes  Patient washes hands before animal contact yes  Patient washes hands after animal contact yes  Behavioral Response: Did not attend.   Fernando Carr Fernando Carr, LRT/CTRS  Fernando Carr L 08/21/2014 5:07 PM

## 2014-08-21 NOTE — [facility] Group Notes (Signed)
[facility] LCSW Group Therapy 07/02/2015 1:15 PM Type of Therapy: Group Therapy Participation Level: Active  Participation Quality: Attentive, Sharing and Supportive  Affect: Depressed and Flat  Cognitive: Alert and Oriented  Insight: Developing/Improving and Engaged  Engagement in Therapy: Developing/Improving and Engaged  Modes of Intervention: Activity, Clarification, Confrontation, Discussion, Education, Exploration, Limit-setting, Orientation, Problem-solving, Rapport Building, Reality Testing, Socialization and Support  Summary of Progress/Problems: Patient was attentive and engaged with speaker from [employer]. Patient was attentive to speaker while they shared their story of dealing with mental health and overcoming it. Patient expressed interest in their programs and services and received information on their agency. Patient processed ways they can relate to the speaker.   Doll Frazee, MSW, LCSWA Clinical Social Worker [facility] [phone]   

## 2014-08-21 NOTE — [facility] Group Notes (Signed)
[facility] Group Notes:  (Nursing/MHT/Case Management/Adjunct)  Date:  08/21/2014  Time:  0900am  Type of Therapy:  Nurse Education  Participation Level:  Did Not Attend  Participation Quality:  Did not attend  Affect:  Did not attend  Cognitive:  Did not attend  Insight:  None  Engagement in Group:  Did not attend  Modes of Intervention:  Discussion, Education, Orientation and Support  Summary of Progress/Problems: Patient did not attend group and remained in bed resting.  Charlyne Quale A 08/21/2014, 12:38 PM

## 2014-08-22 NOTE — Progress Notes (Signed)
D: Patient is alert and oriented. Pt's mood and affect is depressed and flat but pleasant upon interaction with RN. Pt's eye contact is fair. Pt's speech is soft. Pt denies SI/HI and AVH at this time. Pt engages minimally and forwards little with RN. Pt is attending groups. Pt rests in bed some this morning. Pt became active in the milieu this afternoon and was observed interacting in the day room. A: Encouragement/Support provided to pt. Scheduled medications administered per providers orders (See MAR). 15 minute checks continued per protocol for patient safety.  R: Patient cooperative and receptive to nursing interventions. Pt remains safe.

## 2014-08-22 NOTE — Progress Notes (Signed)
D: client visible on the unit, in dayroom watching TV, affect brighter, pleasant, reports depression decreased at "3" of 10. "not much" Client still in scrubs reports no clothes with him, malodorous.  A: Writer provided emotional support encouraged interaction. Writer encouraged client to take a shower, new scrubs provided. Staff will monitor q63min for safety. R: client is safe on the unit, did not attend group. Client did not shower "I'll take one in the morning"

## 2014-08-22 NOTE — Progress Notes (Signed)
[facility] MD Progress Note  08/22/2014 10:57 AM Fernando Carr  MRN:  [id number] Subjective: Patient states "I am Ok ,I feel much better."  Objective : Patient seen and chart reviewed. Patient discussed with staff. Patient today appears to be more bright ,more interactive. Patient denies SI/HI/AH/VH. Patient had two episodes of diarrhea yesterday AM ,but none after that. Patient reports not having been able to pass bowel properly after surgery . Patient with no GI issues today. Reports appetite as good. Patient reports medications as effective. Continues to have tremors of his BL hands, possibly induced by neuroleptics.Patient with no psychotic sx or delusions. Patient denied any mood lability . Patient has concerns about living by self ,needs assistance with shopping and cooking. CSW will attempt to contact sister and look at resources available,   Principal Problem: MDD (major depressive disorder), recurrent severe, without psychosis Diagnosis:  Primary Psychiatric Diagnosis: MDD ,recurrent severe without psychosis   Secondary Psychiatric Diagnosis: Neuroleptic induced parkinsonism   Non Psychiatric Diagnosis: See PMH     Patient Active Problem List   Diagnosis Date Noted  . MDD (major depressive disorder), recurrent severe, without psychosis [F33.2] 08/20/2014  . 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 Severe depression [F32.9]   . Major depressive disorder, recurrent, severe without psychotic features [F33.2]   . AAA (abdominal aortic aneurysm) [I71.4] 07/07/2014  . AKI (acute kidney injury) [N17.9] 03/10/2014  . Dehydration [E86.0] 03/10/2014  . FTT (failure to thrive) in adult [R62.7] 03/10/2014  . 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 Sinus bradycardia [R00.1] 03/10/2014  . HTN (hypertension) [I10] 03/10/2014  . Hypotension, postural [I95.1] 03/09/2014  . CRD (chronic renal disease), stage III [N18.3] 01/09/2014  . Abdominal aortic aneurysm [I71.4] 12/20/2011   . Bipolar 1 disorder [F31.9] 10/24/2011  . Hyperlipemia [E78.5] 10/10/2011   Total Time spent with patient: 30 minutes   Past Medical History:  Past Medical History  Diagnosis Date  . High cholesterol   . Bipolar disorder   . AAA (abdominal aortic aneurysm)   . Depression   . Renal insufficiency     chronic renal   . Suicide attempt aug. 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 AAA (abdominal aortic aneurysm)     Past Surgical History  Procedure Laterality Date  . Knee arthroscopy Left 1972  . Abdominal aortic aneurysm repair N/A 07/07/2014    Procedure: ABDOMINAL AORTIC ANEURYSM REPAIR;  Surgeon: Rosetta Posner, MD;  Location: [facility] OR;  Service: Vascular;  Laterality: N/A;   Family History:  Family History  Problem Relation Age of Onset  . Alcohol abuse Mother   . Alcohol abuse Father   . Suicidality Paternal Uncle   . Suicidality Cousin   . Depression Daughter    Social History:  History  Alcohol Use No     History  Drug Use No    History   Social History  . Marital Status: Single    Spouse Name: N/A    Number of Children: N/A  . Years of Education: N/A   Social History Main Topics  . Smoking status: Former Smoker -- 1.00 packs/day for 40 years    Types: Cigarettes    Quit date: 03/11/2014  . Smokeless tobacco: Never Used  . Alcohol Use: No  . Drug Use: No  . Sexual Activity: No   Other Topics Concern  . None   Social History Narrative   Additional History:    Sleep: Fair  Appetite:  Fair     Musculoskeletal: Strength & Muscle Tone: within normal limits Gait & Station: normal Patient leans: N/A   Psychiatric Specialty Exam: Physical Exam  ROS  Blood pressure 149/64, pulse 53, temperature 98 F (36.7 C), temperature source Oral, resp. rate 20, height 5' 11.5" (1.816 m), weight 91.173 kg (201 lb), SpO2 99 %.Body mass index is 27.65 kg/(m^2).  General Appearance: Casual  Eye Contact::  Fair  Speech:  Clear and  Coherent  Volume:  Normal  Mood:  Depressed improving  Affect:  Restricted  Thought Process:  Coherent  Orientation:  Full (Time, Place, and Person)  Thought Content:  WDL  Suicidal Thoughts:  No  Homicidal Thoughts:  No  Memory:  Immediate;   Fair Recent;   Fair Remote;   Fair  Judgement:  Fair  Insight:  Shallow  Psychomotor Activity:  Tremor  Concentration:  Fair  Recall:  [employer] of Knowledge:Fair  Language: Fair  Akathisia:  No  Handed:  Right  AIMS (if indicated):     Assets:  Communication Skills Desire for Improvement  ADL's:  Intact  Cognition: WNL  Sleep:  Number of Hours: 6     Current Medications: Current Facility-Administered Medications  Medication Dose Route Frequency Provider Last Rate Last Dose  . amantadine (SYMMETREL) capsule 100 mg  100 mg Oral BID Ursula Alert, MD   100 mg at 08/22/14 0751  . fenofibrate tablet 54 mg  54 mg Oral Daily Delfin Gant, NP   54 mg at 08/22/14 0751  . loperamide (IMODIUM) capsule 2 mg  2 mg Oral PRN Laverle Hobby, PA-C   2 mg at 08/21/14 0802  . multivitamin with minerals tablet 1 tablet  1 tablet Oral Daily Delfin Gant, NP   1 tablet at 08/22/14 0751  . pravastatin (PRAVACHOL) tablet 20 mg  20 mg Oral q1800 Delfin Gant, NP   20 mg at 08/21/14 1807  . sertraline (ZOLOFT) tablet 25 mg  25 mg Oral Daily Ursula Alert, MD   25 mg at 08/22/14 0751  . thiamine (VITAMIN B-1) tablet 100 mg  100 mg Oral Daily Sheila May Agustin, NP   100 mg at 08/22/14 3818    Lab Results:  Results for orders placed or performed during the hospital encounter of 08/19/14 (from the past 48 hour(s))  TSH     Status: Abnormal   Collection Time: 08/21/14  7:05 AM  Result Value Ref Range   TSH 0.321 (L) 0.350 - 4.500 uIU/mL    Comment: Performed at [facility]    Physical Findings: AIMS: Facial and Oral Movements Muscles of Facial Expression: None, normal Lips and Perioral Area: None, normal Jaw: None,  normal Tongue: None, normal,Extremity Movements Upper (arms, wrists, hands, fingers): None, normal Lower (legs, knees, ankles, toes): None, normal, Trunk Movements Neck, shoulders, hips: None, normal, Overall Severity Severity of abnormal movements (highest score from questions above): None, normal Incapacitation due to abnormal movements: None, normal Patient's awareness of abnormal movements (rate only patient's report): No Awareness, Dental Status Current problems with teeth and/or dentures?: No Does patient usually wear dentures?: No  CIWA:    COWS:     Assessment: Patient is a 74 year old CM with hx of depression versus Bipolar disorder. Based on my evaluation ,pt likely has unipolar depression , and not Bipolar. Will start a new antidepressant medication ,since he has been on Lexapro for a long time.   Treatment Plan Summary:  Daily contact with patient to assess and evaluate symptoms and progress in treatment and Medication management  Patient with past hx of depression. Patient's diagnosis recently changed to Bipolar disorder per outpatient psychiatrist . However patient not able to verbalize any bipolar disorder sx. Does reports anxiety as well as racing thoughts on and off. Denies any psychotic sx. Reviewed outpatient notes at [place], [place].Patient with recent surgery ,as well as decompensation after that. Patient started on Zoloft after admission to [facility]. Will continue the same dose. Will continue amantadine 100 mg po bid for tremors.DC risperdal due to hand tremors as well as he denies any psychosis. Patient is elderly and antipsychotics should be used with caution. Will continue to monitor and reassess for the need for an antipsychotic . CSW will work on disposition. Patient to be monitored for medical issues, VS reviewed. Pt to be encouraged to take po fluids.      Medical Decision Making:  Established Problem, Stable/Improving (1), New problem, with additional work up  planned, Review or order medicine tests (1), Review of Medication Regimen & Side Effects (2) and Review of New Medication or Change in Dosage (2) Problem Points:  Established problem, stable/improving (1), New problem, with additional work-up planned (4), Review of last therapy session (1) and Review of psycho-social stressors (1) Data Points:  Review and summation of old records (2) Review of medication regiment & side effects (2)    Yamilet Mcfayden md 08/22/2014, 10:57 AM

## 2014-08-22 NOTE — Plan of Care (Signed)
Problem: Ineffective individual coping Goal: STG: Patient will remain free from self harm Outcome: Progressing Patient remains free from self harm. 15 minute checks continued per protocol for patient safety.   Problem: Alteration in mood Goal: LTG-Patient reports reduction in suicidal thoughts (Patient reports reduction in suicidal thoughts and is able to verbalize a safety plan for whenever patient is feeling suicidal)  Outcome: Progressing Patient denies having any suicidal thoughts today. Goal: STG-Patient is able to discuss feelings and issues (Patient is able to discuss feelings and issues leading to depression)  Outcome: Not Progressing Patient forwards little and responds minimally to RN.  Problem: Diagnosis: Increased Risk For Suicide Attempt Goal: STG-Patient Will Attend All Groups On The Unit Outcome: Progressing Patient is attending some unit groups. Goal: STG-Patient Will Comply With Medication Regime Outcome: Progressing Patient has adhered to medication regimen today with ease.

## 2014-08-22 NOTE — Progress Notes (Signed)
[facility] LCSW Aftercare Discharge Planning Group Note  08/22/2014 8:45 AM  Participation Quality: Alert, Appropriate and Oriented  Mood/Affect: Appropriate  Depression Rating: 2  Anxiety Rating: 2  Thoughts of Suicide: Pt denies SI/HI  Will you contract for safety? Yes  Current AVH: Pt denies  Plan for Discharge/Comments: Pt attended discharge planning group and actively participated in group. Pt reports improving mood and is agreeable with discharge plan.   Transportation Means: Pt reports access to transportation  Supports: No supports mentioned at this time  Peri Maris, [place] 08/22/2014 9:48 AM

## 2014-08-23 MED ORDER — RISPERIDONE 0.5 MG PO TABS
0.5000 mg | ORAL_TABLET | Freq: Every day | ORAL | Status: DC
  Administered 2014-08-23 – 2014-08-26 (×4): 0.5 mg via ORAL
  Filled 2014-08-23 (×6): qty 1

## 2014-08-23 MED ORDER — SERTRALINE HCL 50 MG PO TABS
50.0000 mg | ORAL_TABLET | Freq: Every day | ORAL | Status: DC
  Administered 2014-08-24: 50 mg via ORAL
  Filled 2014-08-23 (×3): qty 1

## 2014-08-23 NOTE — Progress Notes (Signed)
Patient ID: Fernando Carr, male   DOB: 17-May-1941, 74 y.o.   MRN: [id number]   D: Pt has been very flat and depressed on the unit today, he did not attend any groups nor did he engage in treatment. Pt reported that his depression was a 3, and that his hopelessness was a 2. Pt reported that his goal for today was to eat better, pt reported that he felt like he ate more today. Pt reported being negative SI/HI, no AH/VH noted. A: 15 min checks continued for patient safety. R: Pt safety maintained.

## 2014-08-23 NOTE — Progress Notes (Signed)
D)  Has been in bed this evening, dozing at intervals.  Stated was too tired to go to group this evening, but was compliant with meds, refused snack, and didn't want anything brought to him.  Denied thoughts of self harm, stated was just tired and wanted to sleep tonight.   A)  Will continue to monitor for safety, continue POC, encourage participation in program R)  Safety maintained.

## 2014-08-23 NOTE — Progress Notes (Signed)
Patient ID: Fernando Carr, male   DOB: [birth year], 73 y.o.   MRN: [id number] CSW provided patient with listing of free meals available in [place], [place] as requested. Sheilah Pigeon, LCSW

## 2014-08-23 NOTE — Progress Notes (Signed)
[facility] MD Progress Note  08/23/2014 12:45 PM [place]  MRN:  [id number]   Subjective: I still feel depressed and hopeless.    Objective : Patient seen and chart reviewed.  Patient is known to this Probation officer from outpatient services for more than few years.  He's been decompensating slowly because he has limited social network and recently he has abdominal aortic aneurysm surgery.  He is complaining of decreased appetite, fatigue and decided to have feeling of hopelessness and worthlessness.  He is taking Zoloft 25 mg and sometime he feel it is not working.  He sleeping on and off and he continues to have racing thoughts and overwhelming thoughts.  He admitted that his depression is mostly because of boredom and he has no one to take care of him.  His daughter lives in [place].  Patient used to take Risperdal however it is discontinued on this hospital stay.  However patient has history of mania and psychosis in the past.  In the past he had spent a lot of money without reasoning .  He has bought expensive cars when he cannot afford.  He also make impulsive decision and given money to his friends and neighbors for no reason.  In the past we had tried on only antidepressant however he started to have manic symptoms and he spent a lot of money and then he has to get legal sheild for his protection.  We tried Abilify however he did not like and be switched him from Risperdal.  I do believe patient required low-dose Risperdal to prevent any manic episodes.  I will also increase his Zoloft to help his depression.  Patient continues to have hallucination but he is unexplained the details about his hallucinations.   Principal Problem: MDD (major depressive disorder), recurrent severe, without psychosis Diagnosis:  Primary Psychiatric Diagnosis: MDD ,recurrent severe without psychosis   Secondary Psychiatric Diagnosis: Neuroleptic induced parkinsonism   Non Psychiatric Diagnosis: See PMH     Patient  Active Problem List   Diagnosis Date Noted  . MDD (major depressive disorder), recurrent severe, without psychosis [F33.2] 08/20/2014  . 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 Severe depression [F32.9]   . Major depressive disorder, recurrent, severe without psychotic features [F33.2]   . AAA (abdominal aortic aneurysm) [I71.4] 07/07/2014  . AKI (acute kidney injury) [N17.9] 03/10/2014  . Dehydration [E86.0] 03/10/2014  . FTT (failure to thrive) in adult [R62.7] 03/10/2014  . 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 Sinus bradycardia [R00.1] 03/10/2014  . HTN (hypertension) [I10] 03/10/2014  . Hypotension, postural [I95.1] 03/09/2014  . CRD (chronic renal disease), stage III [N18.3] 01/09/2014  . Abdominal aortic aneurysm [I71.4] 12/20/2011  . Bipolar 1 disorder [F31.9] 10/24/2011  . Hyperlipemia [E78.5] 10/10/2011   Total Time spent with patient: 30 minutes   Past Medical History:  Past Medical History  Diagnosis Date  . High cholesterol   . Bipolar disorder   . AAA (abdominal aortic aneurysm)   . Depression   . Renal insufficiency     chronic renal   . Suicide attempt aug. 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 AAA (abdominal aortic aneurysm)     Past Surgical History  Procedure Laterality Date  . Knee arthroscopy Left 1972  . Abdominal aortic aneurysm repair N/A 07/07/2014    Procedure: ABDOMINAL AORTIC ANEURYSM REPAIR;  Surgeon: Rosetta Posner, MD;  Location: [place];  Service: Vascular;  Laterality: N/A;   Family  History:  Family History  Problem Relation Age of Onset  . Alcohol abuse Mother   . Alcohol abuse Father   . Suicidality Paternal Uncle   . Suicidality Cousin   . Depression Daughter    Social History:  History  Alcohol Use No     History  Drug Use No    History   Social History  . Marital Status: Single    Spouse Name: N/A    Number of Children: N/A  . Years of Education: N/A   Social History Main Topics   . Smoking status: Former Smoker -- 1.00 packs/day for 40 years    Types: Cigarettes    Quit date: 03/11/2014  . Smokeless tobacco: Never Used  . Alcohol Use: No  . Drug Use: No  . Sexual Activity: No   Other Topics Concern  . None   Social History Narrative   Additional History:    Sleep: Fair  Appetite:  Fair     Musculoskeletal: Strength & Muscle Tone: within normal limits Gait & Station: normal Patient leans: N/A   Psychiatric Specialty Exam: Physical Exam  Review of Systems  Neurological: Positive for tremors.  Psychiatric/Behavioral: Positive for depression and hallucinations.       Unspecified hallucinations    Blood pressure 93/5, pulse 61, temperature 98.3 F (36.8 C), temperature source Oral, resp. rate 16, height 5' 11.5" (1.816 m), weight 91.173 kg (201 lb), SpO2 99 %.Body mass index is 27.65 kg/(m^2).  General Appearance: Casual  Eye Contact::  Fair  Speech:  Clear and Coherent  Volume:  Normal  Mood:  Depressed improving  Affect:  Restricted  Thought Process:  Coherent  Orientation:  Full (Time, Place, and Person)  Thought Content:  Hallucinations: Unspecified  Suicidal Thoughts:  No  Homicidal Thoughts:  No  Memory:  Immediate;   Fair Recent;   Fair Remote;   Fair  Judgement:  Fair  Insight:  Shallow  Psychomotor Activity:  Tremor  Concentration:  Fair  Recall:  [employer] of Knowledge:Fair  Language: Fair  Akathisia:  No  Handed:  Right  AIMS (if indicated):     Assets:  Communication Skills Desire for Improvement  ADL's:  Intact  Cognition: WNL  Sleep:  Number of Hours: 6     Current Medications: Current Facility-Administered Medications  Medication Dose Route Frequency Provider Last Rate Last Dose  . amantadine (SYMMETREL) capsule 100 mg  100 mg Oral BID Ursula Alert, MD   100 mg at 08/23/14 1214  . fenofibrate tablet 54 mg  54 mg Oral Daily Delfin Gant, NP   54 mg at 08/23/14 1214  . loperamide (IMODIUM) capsule 2  mg  2 mg Oral PRN Laverle Hobby, PA-C   2 mg at 08/21/14 0802  . multivitamin with minerals tablet 1 tablet  1 tablet Oral Daily Delfin Gant, NP   1 tablet at 08/23/14 1214  . pravastatin (PRAVACHOL) tablet 20 mg  20 mg Oral q1800 Delfin Gant, NP   20 mg at 08/22/14 1808  . risperiDONE (RISPERDAL) tablet 0.5 mg  0.5 mg Oral QHS Kathlee Nations, MD      . Derrill Memo ON 08/24/2014] sertraline (ZOLOFT) tablet 50 mg  50 mg Oral Daily Kathlee Nations, MD      . thiamine (VITAMIN B-1) tablet 100 mg  100 mg Oral Daily Sheila May Agustin, NP   100 mg at 08/23/14 1214    Lab Results:  No results found  for this or any previous visit (from the past 48 hour(s)).  Physical Findings: AIMS: Facial and Oral Movements Muscles of Facial Expression: None, normal Lips and Perioral Area: None, normal Jaw: None, normal Tongue: None, normal,Extremity Movements Upper (arms, wrists, hands, fingers): None, normal Lower (legs, knees, ankles, toes): None, normal, Trunk Movements Neck, shoulders, hips: None, normal, Overall Severity Severity of abnormal movements (highest score from questions above): None, normal Incapacitation due to abnormal movements: None, normal Patient's awareness of abnormal movements (rate only patient's report): No Awareness, Dental Status Current problems with teeth and/or dentures?: No Does patient usually wear dentures?: No  CIWA:    COWS:     Assessment:  Patient is 74 year old who is recently admitted because of severe depression and having suicidal thoughts .  Patient has history of taking antifreeze in the past and having manic episodes.  I will consider adding low-dose Risperdal and increase Zoloft to help his depression and to prevent his mania.  Patient is taking amantadine for his tremors.  We will continue to follow up.  He will require assisted living facility upon discharge.  Encouraged to participate in group milieu therapy and Continue to follow the efficacy of  medication.  Medical Decision Making:  Established Problem, Stable/Improving (1), New problem, with additional work up planned, Decision to obtain old records (1), Review and summation of old records (2), Review or order medicine tests (1), Review of Medication Regimen & Side Effects (2) and Review of New Medication or Change in Dosage (2) Problem Points:  Established problem, stable/improving (1), New problem, with additional work-up planned (4), Review of last therapy session (1) and Review of psycho-social stressors (1) Data Points:  Review and summation of old records (2) Review of medication regiment & side effects (2)    Ottilia Pippenger T. MD 08/23/2014, 12:45 PM

## 2014-08-23 NOTE — BHH Group Notes (Signed)
[place] LCSW Group Therapy Note  08/23/2014 / 11:15 AM  Type of Therapy and Topic:  Group Therapy: Avoiding Self-Sabotaging and Enabling Behaviors  Participation Level:  Minimal  Therapeutic Goals: 1. Patient will identify one obstacle that relates to self-sabotage and enabling behaviors 2. Patient will identify one personal self-sabotaging or enabling behavior they did prior to admission 3. Patient able to establish a plan to change the above identified behavior they did prior to admission:  4. Patient will demonstrate ability to communicate their needs through discussion and/or role plays.   Summary of Patient Progress: The main focus of today's process group was to discuss what "self-sabotage" means and use Motivational Interviewing to discuss what benefits, negative or positive, were involved in a self-identified self-sabotaging behavior. We then talked about reasons the patient may want to change the behavior and current desire to change. The patient choose not to identify with self sabotaging behavior. He was unengaged in group process as evidenced by lack of tracking and concentration on self inventory sheet given to him by RN. Patient was encourgared to participate with direct questioning. During his sharing he disclosed  Difficulty providing food for himself and lack of self motivation. Pt will be provided list of area meal resources. Others offered encouragement.    Therapeutic Modalities:   Cognitive Behavioral Therapy Person-Centered Therapy Motivational Interviewing   Sheilah Pigeon, LCSW

## 2014-08-23 NOTE — Progress Notes (Signed)
Did not attend group 

## 2014-08-24 MED ORDER — SERTRALINE HCL 100 MG PO TABS
100.0000 mg | ORAL_TABLET | Freq: Every day | ORAL | Status: DC
  Administered 2014-08-25 – 2014-08-27 (×3): 100 mg via ORAL
  Filled 2014-08-24: qty 1
  Filled 2014-08-24: qty 7
  Filled 2014-08-24 (×4): qty 1

## 2014-08-24 NOTE — BHH Group Notes (Signed)
[facility] LCSW Group Therapy  08/24/2014 1:15 PM   Type of Therapy:  Group Therapy  Participation Level:  Did Not Attend  Regan Lemming, LCSW 08/24/2014 3:19 PM

## 2014-08-24 NOTE — Progress Notes (Signed)
Attended group 

## 2014-08-24 NOTE — Progress Notes (Signed)
Patient ID: Fernando Carr, male   DOB: 06/19/1941, 74 y.o.   MRN: [id number]   D: Pt has been appropriate on the unit today, he has attended all groups and engaged in treatment. Pt reported that his depression was a 2, and that his helplessness was a 7. Pt reported that his goal for today was to continue to eat more, patient has eaten at all meals. Pt reported being negative SI/HI, no AH/VH noted. A: 15 min checks continued for patient safety. R: Pt safety maintained.

## 2014-08-24 NOTE — Progress Notes (Signed)
[facility] MD Progress Note  08/24/2014 12:12 PM Fernando Carr  MRN:  [id number]   Subjective: I like Risperdal.  I was able to sleep better.      Objective : Patient seen and chart reviewed.  Patient reported better sleep last night.  He was given Risperdal 0.5 mg at bedtime.  He continues to have some time feeling of hopelessness and worthlessness and passive suicidal thoughts but denies any plan or any intent.  He endorsed racing thoughts and gets easily overwhelmed.  He reported his tremors are improved from the past.  Patient is wondering if the Zoloft can be further increase.  Patient is going to the groups but he has limited participation and involvement in the groups.    Principal Problem: MDD (major depressive disorder), recurrent severe, without psychosis Diagnosis:  Primary Psychiatric Diagnosis: MDD ,recurrent severe without psychosis   Secondary Psychiatric Diagnosis: Neuroleptic induced parkinsonism   Non Psychiatric Diagnosis: See PMH     Patient Active Problem List   Diagnosis Date Noted  . MDD (major depressive disorder), recurrent severe, without psychosis [F33.2] 08/20/2014  . 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 Severe depression [F32.9]   . Major depressive disorder, recurrent, severe without psychotic features [F33.2]   . AAA (abdominal aortic aneurysm) [I71.4] 07/07/2014  . AKI (acute kidney injury) [N17.9] 03/10/2014  . Dehydration [E86.0] 03/10/2014  . FTT (failure to thrive) in adult [R62.7] 03/10/2014  . 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 Sinus bradycardia [R00.1] 03/10/2014  . HTN (hypertension) [I10] 03/10/2014  . Hypotension, postural [I95.1] 03/09/2014  . CRD (chronic renal disease), stage III [N18.3] 01/09/2014  . Abdominal aortic aneurysm [I71.4] 12/20/2011  . Bipolar 1 disorder [F31.9] 10/24/2011  . Hyperlipemia [E78.5] 10/10/2011   Total Time spent with patient: 20 minutes   Past Medical History:  Past Medical History   Diagnosis Date  . High cholesterol   . Bipolar disorder   . AAA (abdominal aortic aneurysm)   . Depression   . Renal insufficiency     chronic renal   . Suicide attempt aug. 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 AAA (abdominal aortic aneurysm)     Past Surgical History  Procedure Laterality Date  . Knee arthroscopy Left 1972  . Abdominal aortic aneurysm repair N/A 07/07/2014    Procedure: ABDOMINAL AORTIC ANEURYSM REPAIR;  Surgeon: Rosetta Posner, MD;  Location: [facility] OR;  Service: Vascular;  Laterality: N/A;   Family History:  Family History  Problem Relation Age of Onset  . Alcohol abuse Mother   . Alcohol abuse Father   . Suicidality Paternal Uncle   . Suicidality Cousin   . Depression Daughter    Social History:  History  Alcohol Use No     History  Drug Use No    History   Social History  . Marital Status: Single    Spouse Name: N/A    Number of Children: N/A  . Years of Education: N/A   Social History Main Topics  . Smoking status: Former Smoker -- 1.00 packs/day for 40 years    Types: Cigarettes    Quit date: 03/11/2014  . Smokeless tobacco: Never Used  . Alcohol Use: No  . Drug Use: No  . Sexual Activity: No   Other Topics Concern  . None   Social History Narrative   Additional History:    Sleep: Fair  Appetite:  Fair     Musculoskeletal: Strength &  Muscle Tone: within normal limits Gait & Station: normal Patient leans: N/A   Psychiatric Specialty Exam: Physical Exam  Review of Systems  Psychiatric/Behavioral: Positive for depression. The patient is nervous/anxious.     Blood pressure 105/53, pulse 59, temperature 97.7 F (36.5 C), temperature source Oral, resp. rate 20, height 5' 11.5" (1.816 m), weight 91.173 kg (201 lb), SpO2 99 %.Body mass index is 27.65 kg/(m^2).  General Appearance: Casual  Eye Contact::  Fair  Speech:  Clear and Coherent  Volume:  Normal  Mood:  Depressed improving  Affect:   Restricted  Thought Process:  Coherent  Orientation:  Full (Time, Place, and Person)  Thought Content:  Hallucinations: Unspecified  Suicidal Thoughts:  No  Homicidal Thoughts:  No  Memory:  Immediate;   Fair Recent;   Fair Remote;   Fair  Judgement:  Fair  Insight:  Shallow  Psychomotor Activity:  Tremor  Concentration:  Fair  Recall:  [employer] of Knowledge:Fair  Language: Fair  Akathisia:  No  Handed:  Right  AIMS (if indicated):     Assets:  Communication Skills Desire for Improvement  ADL's:  Intact  Cognition: WNL  Sleep:  Number of Hours: 6.75     Current Medications: Current Facility-Administered Medications  Medication Dose Route Frequency Provider Last Rate Last Dose  . amantadine (SYMMETREL) capsule 100 mg  100 mg Oral BID Ursula Alert, MD   100 mg at 08/24/14 1036  . fenofibrate tablet 54 mg  54 mg Oral Daily Delfin Gant, NP   54 mg at 08/24/14 1036  . loperamide (IMODIUM) capsule 2 mg  2 mg Oral PRN Laverle Hobby, PA-C   2 mg at 08/21/14 0802  . multivitamin with minerals tablet 1 tablet  1 tablet Oral Daily Delfin Gant, NP   1 tablet at 08/24/14 1036  . pravastatin (PRAVACHOL) tablet 20 mg  20 mg Oral q1800 Delfin Gant, NP   20 mg at 08/23/14 1718  . risperiDONE (RISPERDAL) tablet 0.5 mg  0.5 mg Oral QHS Kathlee Nations, MD   0.5 mg at 08/23/14 2108  . [START ON 08/25/2014] sertraline (ZOLOFT) tablet 100 mg  100 mg Oral Daily Kathlee Nations, MD      . thiamine (VITAMIN B-1) tablet 100 mg  100 mg Oral Daily Sheila May Agustin, NP   100 mg at 08/24/14 1036    Lab Results:  No results found for this or any previous visit (from the past 48 hour(s)).  Physical Findings: AIMS: Facial and Oral Movements Muscles of Facial Expression: None, normal Lips and Perioral Area: None, normal Jaw: None, normal Tongue: None, normal,Extremity Movements Upper (arms, wrists, hands, fingers): None, normal Lower (legs, knees, ankles, toes): None, normal,  Trunk Movements Neck, shoulders, hips: None, normal, Overall Severity Severity of abnormal movements (highest score from questions above): None, normal Incapacitation due to abnormal movements: None, normal Patient's awareness of abnormal movements (rate only patient's report): No Awareness, Dental Status Current problems with teeth and/or dentures?: No Does patient usually wear dentures?: No  CIWA:    COWS:     Assessment:  Patient is 74 year old who is recently admitted because of severe depression and having suicidal thoughts .  Yesterday he was started on low-dose Risperdal which helps his sleep and hallucination.  I would increase his Zoloft further .  His tremor is also getting better with amantadine.  We will continue to follow up on his symptoms .  He will require  assisted living facility upon discharge.  Encouraged to participate in group milieu therapy and Continue to follow the efficacy of medication.  Medical Decision Making:  Established Problem, Stable/Improving (1), Review of Last Therapy Session (1), Review of Medication Regimen & Side Effects (2) and Review of New Medication or Change in Dosage (2) Problem Points:  Established problem, stable/improving (1), Review of last therapy session (1) and Review of psycho-social stressors (1) Data Points:  Review of medication regiment & side effects (2)    Joon Pohle T. MD 08/24/2014, 12:12 PM

## 2014-08-25 DIAGNOSIS — R45851 Suicidal ideations: Secondary | ICD-10-CM

## 2014-08-25 NOTE — Progress Notes (Signed)
D: Pt presents with flat affect and depressed mood. Pt has minimal interaction on the unit and appears withdrawn. Pt forwarded little information during shift assessment. Pt rates depression 1/10. Anxiety 1/10. Pt reported sleeping well last night. Pt appears disheveled, has poor hygiene and a body odor.  Pt verbalized that he is tolerating his meds well. A: Medications administered as ordered per MD. Verbal support given. Pt encouraged to attend groups. 15 minute checks performed for safety. Pt encouraged to perform good hygiene. R: Pt compliant with treatment. Pt stated goal is to not have anxiety about eating. Pt stated that he often do not have enough time to eat.

## 2014-08-25 NOTE — Progress Notes (Signed)
[address] [facility] MD Progress Note  08/25/2014 6:18 PM Fernando Carr  MRN:  [id number] Subjective:  Admits he has been increasingly more depressed since he had the surgery to correct he AAA. He is also dealing with the fact her daughter is in [place] and had a baby for what she cant come to see him. He is basically by himself in his apartment. He isolates admits he has no energy or motivation and does not enjoy things. He is a retired Education officer, museum. He is worried about finances as he is at the end of some of his resources. States he was feeling suicidal for what Dr.[name] recommended that he come to be admitted. He recently went trough a medication change; started on ZOloft Principal Problem: MDD (major depressive disorder), recurrent severe, without psychosis Diagnosis:   Patient Active Problem List   Diagnosis Date Noted  . MDD (major depressive disorder), recurrent severe, without psychosis [F33.2] 08/20/2014  . 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 Severe depression [F32.9]   . Major depressive disorder, recurrent, severe without psychotic features [F33.2]   . AAA (abdominal aortic aneurysm) [I71.4] 07/07/2014  . AKI (acute kidney injury) [N17.9] 03/10/2014  . Dehydration [E86.0] 03/10/2014  . FTT (failure to thrive) in adult [R62.7] 03/10/2014  . 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 Sinus bradycardia [R00.1] 03/10/2014  . HTN (hypertension) [I10] 03/10/2014  . Hypotension, postural [I95.1] 03/09/2014  . CRD (chronic renal disease), stage III [N18.3] 01/09/2014  . Abdominal aortic aneurysm [I71.4] 12/20/2011  . Bipolar 1 disorder [F31.9] 10/24/2011  . Hyperlipemia [E78.5] 10/10/2011   Total Time spent with patient: 30 minutes   Past Medical History:  Past Medical History  Diagnosis Date  . High cholesterol   . Bipolar disorder   . AAA (abdominal aortic aneurysm)   . Depression   . Renal insufficiency     chronic renal   . Suicide attempt aug. 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 AAA (abdominal aortic aneurysm)     Past Surgical History  Procedure Laterality Date  . Knee arthroscopy Left 1972  . Abdominal aortic aneurysm repair N/A 07/07/2014    Procedure: ABDOMINAL AORTIC ANEURYSM REPAIR;  Surgeon: Rosetta Posner, MD;  Location: [facility] OR;  Service: Vascular;  Laterality: N/A;   Family History:  Family History  Problem Relation Age of Onset  . Alcohol abuse Mother   . Alcohol abuse Father   . Suicidality Paternal Uncle   . Suicidality Cousin   . Depression Daughter    Social History:  History  Alcohol Use No     History  Drug Use No    History   Social History  . Marital Status: Single    Spouse Name: N/A    Number of Children: N/A  . Years of Education: N/A   Social History Main Topics  . Smoking status: Former Smoker -- 1.00 packs/day for 40 years    Types: Cigarettes    Quit date: 03/11/2014  . Smokeless tobacco: Never Used  . Alcohol Use: No  . Drug Use: No  . Sexual Activity: No   Other Topics Concern  . None   Social History Narrative   Additional History:    Sleep: Poor  Appetite:  Fair   Assessment:   Musculoskeletal: Strength & Muscle Tone: within normal limits Gait & Station: normal Patient leans: N/A   Psychiatric Specialty Exam: Physical Exam  Review of Systems  Constitutional: Positive for malaise/fatigue.  HENT: Negative.   Eyes: Negative.   Respiratory: Negative.   Cardiovascular: Negative.   Gastrointestinal: Negative.   Genitourinary: Negative.   Musculoskeletal: Negative.   Skin: Negative.   Neurological: Positive for weakness.  Endo/Heme/Allergies: Negative.   Psychiatric/Behavioral: Positive for depression and suicidal ideas. The patient is nervous/anxious and has insomnia.     Blood pressure 149/71, pulse 64, temperature 97.7 F (36.5 C), temperature source Oral, resp. rate 20, height 5' 11.5" (1.816 m), weight 91.173 kg (201 lb), SpO2 99 %.Body mass index  is 27.65 kg/(m^2).  General Appearance: Disheveled  Eye Sport and exercise psychologist::  Fair  Speech:  Clear and Coherent, Slow and not spontaneous  Volume:  Decreased  Mood:  Anxious and Depressed  Affect:  Depressed and anxious worried  Thought Process:  Coherent and Goal Directed  Orientation:  Full (Time, Place, and Person)  Thought Content:  symptoms events worries concerns  Suicidal Thoughts:  Yes.  without intent/plan  Homicidal Thoughts:  No  Memory:  Immediate;   Fair Recent;   Fair Remote;   Fair  Judgement:  Fair  Insight:  Present  Psychomotor Activity:  Restlessness and Tremor  Concentration:  Fair  Recall:  [employer] of Knowledge:Fair  Language: Fair  Akathisia:  No  Handed:  Right  AIMS (if indicated):     Assets:  Desire for Improvement Housing  ADL's:  Intact  Cognition: WNL  Sleep:  Number of Hours: 4.5     Current Medications: Current Facility-Administered Medications  Medication Dose Route Frequency Provider Last Rate Last Dose  . amantadine (SYMMETREL) capsule 100 mg  100 mg Oral BID Ursula Alert, MD   100 mg at 08/25/14 1709  . fenofibrate tablet 54 mg  54 mg Oral Daily Delfin Gant, NP   54 mg at 08/25/14 0846  . loperamide (IMODIUM) capsule 2 mg  2 mg Oral PRN Laverle Hobby, PA-C   2 mg at 08/21/14 0802  . multivitamin with minerals tablet 1 tablet  1 tablet Oral Daily Delfin Gant, NP   1 tablet at 08/25/14 0820  . pravastatin (PRAVACHOL) tablet 20 mg  20 mg Oral q1800 Delfin Gant, NP   20 mg at 08/25/14 1709  . risperiDONE (RISPERDAL) tablet 0.5 mg  0.5 mg Oral QHS Kathlee Nations, MD   0.5 mg at 08/24/14 2057  . sertraline (ZOLOFT) tablet 100 mg  100 mg Oral Daily Kathlee Nations, MD   100 mg at 08/25/14 0820  . thiamine (VITAMIN B-1) tablet 100 mg  100 mg Oral Daily Sheila May Agustin, NP   100 mg at 08/25/14 2951    Lab Results: No results found for this or any previous visit (from the past 64 hour(s)).  Physical Findings: AIMS: Facial and  Oral Movements Muscles of Facial Expression: None, normal Lips and Perioral Area: None, normal Jaw: None, normal Tongue: None, normal,Extremity Movements Upper (arms, wrists, hands, fingers): None, normal Lower (legs, knees, ankles, toes): None, normal, Trunk Movements Neck, shoulders, hips: None, normal, Overall Severity Severity of abnormal movements (highest score from questions above): None, normal Incapacitation due to abnormal movements: None, normal Patient's awareness of abnormal movements (rate only patient's report): No Awareness, Dental Status Current problems with teeth and/or dentures?: No Does patient usually wear dentures?: No  CIWA:    COWS:     Treatment Plan Summary: Daily contact with patient to assess and evaluate symptoms and progress in treatment and Medication management  Supportive approach/coping skills Instal hope/review  lifestyle changes that can help his depression Behavioral activation Will continue the Zoloft at 100 mg daily consider augmentation with another agent. States he took Abilify but does not remember dose, for how long or why it was D/C. Will discuss with Dr. Adele Schilder ( his psychiatrist) Medical Decision Making:  Review of Psycho-Social Stressors (1), Review or order clinical lab tests (1), Review of Medication Regimen & Side Effects (2) and Review of New Medication or Change in Dosage (2) Problem Points:  Established problem, worsening (2) and Review of psycho-social stressors (1) Data Points:  Review of medication regiment & side effects (2) Review of new medications or change in dosage (2)    Sarajean Dessert A 08/25/2014, 6:18 PM

## 2014-08-25 NOTE — BHH Group Notes (Signed)
[place] LCSW Group Therapy 08/25/2014  1:15 pm  Type of Therapy: Group Therapy Participation Level: Active  Participation Quality: Attentive and Supportive  Affect: Depressed and Flat  Cognitive: Alert and Oriented  Insight: Developing/Improving and Engaged  Engagement in Therapy: Developing/Improving and Engaged  Modes of Intervention: Clarification, Confrontation, Discussion, Education, Exploration,  Limit-setting, Orientation, Problem-solving, Rapport Building, Art therapist, Socialization and Support  Summary of Progress/Problems: Pt identified obstacles faced currently and processed barriers involved in overcoming these obstacles. Pt identified steps necessary for overcoming these obstacles and explored motivation (internal and external) for facing these difficulties head on. Pt further identified one area of concern in their lives and chose a goal to focus on for today. Patient attended group and actively listened. He stepped out to speak with the physician before sharing and did not return.  Tilden Fossa, MSW, [place] Worker [facility] [phone]

## 2014-08-25 NOTE — Progress Notes (Signed)
Patient ID: Fernando Carr, male   DOB: [birth year], 74 y.o.   MRN: [id number] PER STATE REGULATIONS 482.30  THIS CHART WAS REVIEWED FOR MEDICAL NECESSITY WITH RESPECT TO THE PATIENT'S ADMISSION/ DURATION OF STAY.  NEXT REVIEW DATE:  08/27/2014  Chauncy Lean, RN, BSN CASE MANAGER

## 2014-08-25 NOTE — Progress Notes (Signed)
D)  Has been more active on the unit this evening, came out to the dayroom to watch the football game, has been quiet but pleasant.  Attended group and participated.  Had a snack and watched tv for a short time before going to bed.  Compliant with meds.  Up to the bathroom for loose brown stool x 2 tonight.  Pt saved stool in hat for staff to eval.   A)  Will continue to monitor for safety, continue POC R)  Safety maintained.

## 2014-08-25 NOTE — BHH Group Notes (Signed)
   [facility] LCSW Aftercare Discharge Planning Group Note  08/25/2014  8:45 AM   Participation Quality: Alert, Appropriate and Oriented  Mood/Affect: Depressed and Flat  Depression Rating: 1-2  Anxiety Rating: 1-2  Thoughts of Suicide: Pt denies SI/HI  Will you contract for safety? Yes  Current AVH: Pt denies  Plan for Discharge/Comments: Pt attended discharge planning group and actively participated in group. CSW provided pt with today's workbook. Patient reports feeling "okay" today and reports improvement in his symptoms. Patient plans to return home to follow up with Dr. Adele Schilder.  Transportation Means: Pt reports access to transportation  Supports: No supports mentioned at this time  Tilden Fossa, MSW, [place] Social Worker [employer] [phone]

## 2014-08-26 NOTE — Clinical Social Work Note (Signed)
CSW spoke with patient about his discharge plans. Patient informed that per MD report he may be discharged tomorrow afternoon in time to make his appointment with Dr. Adele Schilder on 1/27 at 3. Patient agreeable. CSW also discussed IOP program with patient who is agreeable to speak with IOP Case Manager Dellia Nims. CSW left voicemail for Dellia Nims regarding speaking with patient about IOP program.   CSW also spoke with patient about guns in his apartment. Patient reports that he has a family rifle that works but reports that he does not have bullets for it and feels safe having it at his apartment. CSW encouraged patient to have a trusted person temporarily secure it upon returning home. Patient verbalized his understanding.  Tilden Fossa, MSW, [place] Worker [facility] [phone]

## 2014-08-26 NOTE — Progress Notes (Signed)
Patient ID: Fernando Carr, male   DOB: [birth year], 74 y.o.   MRN: [id number] D: Client visible on the unit, watching TV and walking the hall. Client reports "I'm fine" "yea pretty good" A: Writer encouraged shower as  MHT put clean linen on the bed. Staff will monitor q42min for safety. R: Client is safe on the unit, attended group.

## 2014-08-26 NOTE — Progress Notes (Signed)
[facility] MD Progress Note  08/26/2014 6:40 PM Fernando Carr  MRN:  [id number] Subjective:  Fernando Carr states he is feeling somewhat better. States he doesn know how he allowed himself to get this bad. After he came out of physical rehab, after the surgery, he fell out of the habit of taking his medications as they were begin given to him while ther. Admits he had not been taking them on a regular basis Admits  that he had not been eating that well as he would not have the energy or the motivation to fix meals for himself. He had relied on "frozen stuff" when he got really hungry. He states he worries about his daughter as she has this new baby. He does not see her as able to do much for him,  being in [place]. He admits he had gotten in this "rut" and he is open to try ways of getting himself out. Denies he would hurt himself. States he wants to be there for his daughter Principal Problem: MDD (major depressive disorder), recurrent severe, without psychosis Diagnosis:   Patient Active Problem List   Diagnosis Date Noted  . MDD (major depressive disorder), recurrent severe, without psychosis [F33.2] 08/20/2014  . 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 Severe depression [F32.9]   . Major depressive disorder, recurrent, severe without psychotic features [F33.2]   . AAA (abdominal aortic aneurysm) [I71.4] 07/07/2014  . AKI (acute kidney injury) [N17.9] 03/10/2014  . Dehydration [E86.0] 03/10/2014  . FTT (failure to thrive) in adult [R62.7] 03/10/2014  . 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 Sinus bradycardia [R00.1] 03/10/2014  . HTN (hypertension) [I10] 03/10/2014  . Hypotension, postural [I95.1] 03/09/2014  . CRD (chronic renal disease), stage III [N18.3] 01/09/2014  . Abdominal aortic aneurysm [I71.4] 12/20/2011  . Bipolar 1 disorder [F31.9] 10/24/2011  . Hyperlipemia [E78.5] 10/10/2011   Total Time spent with patient: 30 minutes   Past Medical History:  Past Medical History   Diagnosis Date  . High cholesterol   . Bipolar disorder   . AAA (abdominal aortic aneurysm)   . Depression   . Renal insufficiency     chronic renal   . Suicide attempt aug. 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 AAA (abdominal aortic aneurysm)     Past Surgical History  Procedure Laterality Date  . Knee arthroscopy Left 1972  . Abdominal aortic aneurysm repair N/A 07/07/2014    Procedure: ABDOMINAL AORTIC ANEURYSM REPAIR;  Surgeon: Rosetta Posner, MD;  Location: [facility] OR;  Service: Vascular;  Laterality: N/A;   Family History:  Family History  Problem Relation Age of Onset  . Alcohol abuse Mother   . Alcohol abuse Father   . Suicidality Paternal Uncle   . Suicidality Cousin   . Depression Daughter    Social History:  History  Alcohol Use No     History  Drug Use No    History   Social History  . Marital Status: Single    Spouse Name: N/A    Number of Children: N/A  . Years of Education: N/A   Social History Main Topics  . Smoking status: Former Smoker -- 1.00 packs/day for 40 years    Types: Cigarettes    Quit date: 03/11/2014  . Smokeless tobacco: Never Used  . Alcohol Use: No  . Drug Use: No  . Sexual Activity: No   Other Topics Concern  . None   Social History Narrative  Additional History:    Sleep: Fair  Appetite:  Fair   Assessment:   Musculoskeletal: Strength & Muscle Tone: within normal limits Gait & Station: normal Patient leans: N/A   Psychiatric Specialty Exam: Physical Exam  Review of Systems  Constitutional: Negative.   HENT: Negative.   Eyes: Negative.   Respiratory: Negative.   Cardiovascular: Negative.   Gastrointestinal: Negative.   Genitourinary: Negative.   Musculoskeletal: Positive for back pain.  Skin: Negative.   Neurological: Positive for tremors.  Endo/Heme/Allergies: Negative.   Psychiatric/Behavioral: Positive for depression. The patient is nervous/anxious.     Blood pressure 123/57,  pulse 50, temperature 98 F (36.7 C), temperature source Oral, resp. rate 20, height 5' 11.5" (1.816 m), weight 91.173 kg (201 lb), SpO2 99 %.Body mass index is 27.65 kg/(m^2).  General Appearance: Fairly Groomed  Engineer, water::  Fair  Speech:  Clear and Coherent  Volume:  Decreased  Mood:  Anxious and worried  Affect:  Restricted  Thought Process:  Coherent and Goal Directed  Orientation:  Full (Time, Place, and Person)  Thought Content:  symptoms events worries concerns  Suicidal Thoughts:  No  Homicidal Thoughts:  No  Memory:  Immediate;   Fair Recent;   Fair Remote;   Fair  Judgement:  Fair  Insight:  Present  Psychomotor Activity:  Decreased  Concentration:  Fair  Recall:  [employer] of Knowledge:Fair  Language: Fair  Akathisia:  No  Handed:  Right  AIMS (if indicated):     Assets:  Desire for Improvement Housing Transportation  ADL's:  Intact  Cognition: WNL  Sleep:  Number of Hours: 6.25     Current Medications: Current Facility-Administered Medications  Medication Dose Route Frequency Provider Last Rate Last Dose  . amantadine (SYMMETREL) capsule 100 mg  100 mg Oral BID Ursula Alert, MD   100 mg at 08/26/14 1711  . fenofibrate tablet 54 mg  54 mg Oral Daily Delfin Gant, NP   54 mg at 08/26/14 0819  . loperamide (IMODIUM) capsule 2 mg  2 mg Oral PRN Laverle Hobby, PA-C   2 mg at 08/21/14 0802  . multivitamin with minerals tablet 1 tablet  1 tablet Oral Daily Delfin Gant, NP   1 tablet at 08/26/14 0819  . pravastatin (PRAVACHOL) tablet 20 mg  20 mg Oral q1800 Delfin Gant, NP   20 mg at 08/26/14 1711  . risperiDONE (RISPERDAL) tablet 0.5 mg  0.5 mg Oral QHS Kathlee Nations, MD   0.5 mg at 08/25/14 2104  . sertraline (ZOLOFT) tablet 100 mg  100 mg Oral Daily Kathlee Nations, MD   100 mg at 08/26/14 0819  . thiamine (VITAMIN B-1) tablet 100 mg  100 mg Oral Daily Janett Labella, NP   100 mg at 08/26/14 4496    Lab Results: No results found for  this or any previous visit (from the past 48 hour(s)).  Physical Findings: AIMS: Facial and Oral Movements Muscles of Facial Expression: None, normal Lips and Perioral Area: None, normal Jaw: None, normal Tongue: None, normal,Extremity Movements Upper (arms, wrists, hands, fingers): None, normal Lower (legs, knees, ankles, toes): None, normal, Trunk Movements Neck, shoulders, hips: None, normal, Overall Severity Severity of abnormal movements (highest score from questions above): None, normal Incapacitation due to abnormal movements: None, normal Patient's awareness of abnormal movements (rate only patient's report): No Awareness, Dental Status Current problems with teeth and/or dentures?: No Does patient usually wear dentures?: No  CIWA:  COWS:     Treatment Plan Summary: Daily contact with patient to assess and evaluate symptoms and progress in treatment and Medication management Depression: will continue the Zoloft 100 mg daily. Will work on Radiographer, therapeutic, behavioral activation. Will encourage to create a structure for himself in which he would get up in the morning make plans for the day, have a sense of purpose. Will refer to the Mental Health IOP where he could come to the program in the morning continue to work on the depression, go home and pursue the plans put together during the morning sessions at the [place] [facility]:  Review of Psycho-Social Stressors (1) and Review of Medication Regimen & Side Effects (2)     Dejion Grillo A 08/26/2014, 6:40 PM

## 2014-08-26 NOTE — Progress Notes (Signed)
Patient ID: Fernando Carr, male   DOB: [birth year], 74 y.o.   MRN: [id number]  Adult Psychoeducational Group Note  Date:  08/26/2014 Time: 0900  Group Topic/Focus:  Orientation:   The focus of this group is to educate the patient on the purpose and policies of crisis stabilization and provide a format to answer questions about their admission.  The group details unit policies and expectations of patients while admitted.  Participation Level:  Active  Participation Quality:  Appropriate  Affect:  Appropriate  Cognitive:  Alert and Appropriate  Insight: Appropriate  Engagement in Group:  Engaged and Improving  Modes of Intervention:  Discussion, Education and Orientation  Additional Comments:  Pt engaged in group today. Pt encouraged to express concerns and ask questions.   Elenore Rota 08/26/2014, 1:42 PM

## 2014-08-26 NOTE — Progress Notes (Signed)
Recreation Therapy Notes  Animal-Assisted Activity/Therapy (AAA/T) Program Checklist/Progress Notes Patient Eligibility Criteria Checklist & Daily Group note for Rec Tx Intervention  Date: 01.26.2016 Time: 2:45pm Location: [address]   AAA/T Program Assumption of Risk Form signed by Patient/ or Parent Legal Guardian yes  Patient is free of allergies or sever asthma yes  Patient reports no fear of animals yes  Patient reports no history of cruelty to animals yes  Patient understands his/her participation is voluntary yes  Behavioral Response: Did not attend.   Laureen Ochs Vernecia Umble, LRT/CTRS  Lane Hacker 08/26/2014 4:39 PM

## 2014-08-26 NOTE — Progress Notes (Signed)
Patient ID: Fernando Carr, male   DOB: Apr 08, 1941, 74 y.o.   MRN: [id number]   Pt currently presents with a flat affect and pleasant behavior. Per self inventory, pt rates depression at a 2, hopelessness 2 and anxiety 2. Pt's daily goal is to "get all your stuff." Pt reports fair sleep, good concentration and a good appetite. Pt reports that he lost 50 pounds recently and was hospitalized due to "malnourished" recently. Pt reports that he has a hard time making food at home and eating healthy when he eats out. Pt would like a consult with dietary services.   Pt provided with medications per providers orders. Pt's labs and vitals were monitored throughout the day. Pt supported emotionally and encouraged to express concerns and questions. Pt educated on medications and alternative ways to find support systems.  Pt's safety ensured with 15 minute and environmental checks. Pt currently denies SI/HI and A/V hallucinations. Pt verbally agrees to seek staff if SI/HI or A/VH occurs and to consult with staff before acting on these thoughts.

## 2014-08-26 NOTE — Tx Team (Signed)
Interdisciplinary Treatment Plan Update (Adult) Date: 08/26/2014   Time Reviewed: 9:30 AM  Progress in Treatment: Attending groups: Yes Participating in groups: Yes Taking medication as prescribed: Yes Tolerating medication: Yes Family/Significant other contact made: Yes, CSW has spoken with patient's sister Patient understands diagnosis: Yes Discussing patient identified problems/goals with staff: Yes Medical problems stabilized or resolved: Yes Denies suicidal/homicidal ideation: Yes Issues/concerns per patient self-inventory: Yes Other:  New problem(s) identified: N/A  Discharge Plan or Barriers:   1/26: Patient agreeable to return home to follow up with Dr. Adele Schilder at [facility]. He reports having difficulty managing at home alone, CSW has provided patient with information on the PACE program as well as a listing of local personal care aide agencies. Patient has upcoming appointment with Dr. Adele Schilder on 1/27, MD recommending IOP program for patient- CSW will speak with patient about his interest in program.  Reason for Continuation of Hospitalization:  Depression Anxiety Medication Stabilization   Comments: N/A  Estimated length of stay: Discharge anticipated for tomorrow 08/26/14  For review of initial/current patient goals, please see plan of care. Patient is a 74 year old Caucasian Male with a diagnosis of MAJOR DEPRESSIVE DISORDER, RECURRENT, SEVERE . Patient lives in [place] alone. He reports being hospitalized due to increasing depression and SI. He reports feeling lonely and denies having strong support system. He has an older sister in [place] and a daughter in [place]. He reports having difficulty with ADL's. He reports a hospitalization in 2006 for similar issues. He identified his goal as "help knowing that it's okay to be in a tough spot." Patient will benefit from crisis stabilization, medication evaluation, group therapy, and psycho education in  addition to case management for discharge planning. Patient and CSW reviewed pt's identified goals and treatment plan. Pt verbalized understanding and agreed to treatment plan.   Attendees: Patient:    Family:    Physician: Dr. Parke Poisson; Dr. Sabra Heck 08/26/2014 9:30 AM  Nursing: Para March; Grayland Ormond, RN 08/26/2014 9:30 AM  Clinical Social Worker: Tilden Fossa,  [employer] 08/26/2014 9:30 AM  Other: Joette Catching, LCSW 08/26/2014 9:30 AM  Other: Lucinda Dell, [place] Sessions Liaison 08/26/2014 9:30 AM  Other: Lars Pinks, Case Manager 08/26/2014 9:30 AM  Other: Ave Filter, NP 08/26/2014 9:30 AM  Other: Maxie Better, LCSWA 08/26/2014 9:30 AM  Other:    Other:     Scribe for Treatment Team:  Tilden Fossa, MSW, [employer] [phone]

## 2014-08-26 NOTE — [facility] Group Notes (Signed)
[place] LCSW Group Therapy  08/26/2014   1:15 PM   Type of Therapy:  Group Therapy  Participation Level:  Active  Participation Quality:  Attentive, Sharing and Supportive  Affect:  Depressed and Flat  Cognitive:  Alert and Oriented  Insight:  Developing/Improving and Engaged  Engagement in Therapy:  Developing/Improving and Engaged  Modes of Intervention:  Clarification, Confrontation, Discussion, Education, Exploration, Limit-setting, Orientation, Problem-solving, Rapport Building, Art therapist, Socialization and Support  Summary of Progress/Problems: The topic for group therapy was feelings about diagnosis.  Pt actively participated in group discussion on their past and current diagnosis and how they feel towards this.  Pt also identified how society and family members judge them, based on their diagnosis as well as stereotypes and stigmas.  Patient discussed his negative feelings regarding his depression and reported feeling hopeless. Patient reported not being able to think of anything in particular to live for but did agree that his family is important to him. CSW and other group members provided emotional support and encouragement.  Tilden Fossa, MSW, [place] Worker [facility] [phone]

## 2014-08-26 NOTE — Plan of Care (Signed)
Problem: Ineffective individual coping Goal: STG: Patient will remain free from self harm Outcome: Completed/Met Date Met:  08/26/14 Pt remains safe and stable at this time

## 2014-08-26 NOTE — Progress Notes (Signed)
[name]:  (Nursing/MHT/Case Management/Adjunct)  Date:  08/26/2014  Time:  12:54 AM  Type of Therapy:  Psychoeducational Skills  Participation Level:  Active  Participation Quality:  Attentive  Affect:  Appropriate  Cognitive:  Appropriate  Insight:  Appropriate  Engagement in Group:  Developing/Improving  Modes of Intervention:  Education  Summary of Progress/Problems: The patient shared with the group that he had a much better day than yesterday. He attributed his good day to not having any "problems" and because he no longer feels depressed. He also verbalized that he went to the gym for recreation. In terms of the theme for the day, his steps to wellness will include focusing on himself more.   Fernando Carr S 08/26/2014, 12:54 AM

## 2014-08-26 NOTE — Progress Notes (Signed)
Patient ID: Fernando Carr, male   DOB: 1941/01/07, 74 y.o.   MRN: [id number] D: Client pleasant, alert, oriented X 2, person, place. Reports no diarrhea today, "I hadn't been right since I had that colonoscopy in June. Client reports "some polyps were removed" A: Writer provided emotional support, client encouraged to report to staff if any diarrhea tonight. Staff encouraged group and shower. Staff will monitor q26min for safety. R: client is safe on the unit, attended group.

## 2014-08-26 NOTE — Progress Notes (Signed)
Adult Psychoeducational Group Note  Date:  08/26/2014 Time:  9:08 PM  Group Topic/Focus:  Wrap-Up Group:   The focus of this group is to help patients review their daily goal of treatment and discuss progress on daily workbooks.  Participation Level:  Active  Participation Quality:  Appropriate  Affect:  Appropriate  Cognitive:  Appropriate  Insight: Appropriate  Engagement in Group:  Engaged  Modes of Intervention:  Discussion  Additional Comments:  Pt goal was to get through the day without any upsets. Pt accomplish this goal along with the doctor and himself by coming up with a d/c plan.  Quentin Angst 08/26/2014, 9:08 PM

## 2014-08-27 ENCOUNTER — Encounter (HOSPITAL_COMMUNITY): Payer: Self-pay | Admitting: Registered Nurse

## 2014-08-27 ENCOUNTER — Ambulatory Visit (HOSPITAL_COMMUNITY): Payer: Medicare Other | Admitting: Psychiatry

## 2014-08-27 MED ORDER — AMANTADINE HCL 100 MG PO CAPS: 100.0000 mg | ORAL_CAPSULE | Freq: Two times a day (BID) | ORAL | Status: DC

## 2014-08-27 MED ORDER — SIMVASTATIN 10 MG PO TABS: 10.0000 mg | ORAL_TABLET | Freq: Every day | ORAL | Status: DC

## 2014-08-27 MED ORDER — SERTRALINE HCL 100 MG PO TABS: 100.0000 mg | ORAL_TABLET | Freq: Every day | ORAL | Status: DC

## 2014-08-27 MED ORDER — RISPERIDONE 0.5 MG PO TABS
0.5000 mg | ORAL_TABLET | Freq: Every day | ORAL | Status: DC
Start: 2014-08-27 — End: 2014-09-24

## 2014-08-27 NOTE — Discharge Summary (Addendum)
Physician Discharge Summary Note  Patient:  Fernando Carr is an 74 y.o., male MRN:  [id number] DOB:  1941-07-07 Patient phone:  [phone] (home)  Patient address:   [place] [address],  Total Time spent with patient: Greater than 30 minutes  Date of Admission:  08/19/2014 Date of Discharge: 08/27/2014  Reason for Admission:  Fernando Carr is a 74 yo male patient who came in after increased depression.  Patient was seen by Dr Adele Schilder yesterday and was sent to [facility] for admission. Patient is already taking Lexapro and Risperdal Prescribed by Dr Adele Schilder and stated that he is still not feeling better. He reported feeling suicidal but not today. Patient admitted to attempting suicide in 2006 by drinking Antifreez and [facility] Patient lives alone and reported good sleep but poor appetite. When asked what triggered his worsening depression, "I'm just lonely". Patient reported poor energy and stated he is not able to do his grocery shopping. He does however, have neighbors that he is in contact with him. He was actually with the neighbor who was cutting his hair when he reported a brief syncopal episode, "I passed out for a minute." He has a one daughter who just moved to [place].  Principal Problem: MDD (major depressive disorder), recurrent severe, without psychosis Discharge Diagnoses: Patient Active Problem List   Diagnosis Date Noted  . MDD (major depressive disorder), recurrent severe, without psychosis [F33.2] 08/20/2014  . 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 Severe depression [F32.9]   . Major depressive disorder, recurrent, severe without psychotic features [F33.2]   . AAA (abdominal aortic aneurysm) [I71.4] 07/07/2014  . AKI (acute kidney injury) [N17.9] 03/10/2014  . Dehydration [E86.0] 03/10/2014  . FTT (failure to thrive) in adult [R62.7] 03/10/2014  . 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 Sinus bradycardia [R00.1]  03/10/2014  . HTN (hypertension) [I10] 03/10/2014  . Hypotension, postural [I95.1] 03/09/2014  . CRD (chronic renal disease), stage III [N18.3] 01/09/2014  . Abdominal aortic aneurysm [I71.4] 12/20/2011  . Bipolar 1 disorder [F31.9] 10/24/2011  . Hyperlipemia [E78.5] 10/10/2011    Musculoskeletal: Strength & Muscle Tone: within normal limits Gait & Station: normal Patient leans: N/A  Psychiatric Specialty Exam: See Suicide Risk Assessment Physical Exam  Review of Systems  Psychiatric/Behavioral: Negative for suicidal ideas, hallucinations, memory loss and substance abuse. Depression: Stable. Nervous/anxious: Stable. Insomnia: Stable.     Blood pressure 163/68, pulse 55, temperature 98.1 F (36.7 C), temperature source Oral, resp. rate 20, height 5' 11.5" (1.816 m), weight 91.173 kg (201 lb), SpO2 99 %.Body mass index is 27.65 kg/(m^2).    Past Medical History:  Past Medical History  Diagnosis Date  . High cholesterol   . Bipolar disorder   . AAA (abdominal aortic aneurysm)   . Depression   . Renal insufficiency     chronic renal   . Suicide attempt aug. 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 AAA (abdominal aortic aneurysm)     Past Surgical History  Procedure Laterality Date  . Knee arthroscopy Left 1972  . Abdominal aortic aneurysm repair N/A 07/07/2014    Procedure: ABDOMINAL AORTIC ANEURYSM REPAIR;  Surgeon: Rosetta Posner, MD;  Location: [facility] OR;  Service: Vascular;  Laterality: N/A;   Family History:  Family History  Problem Relation Age of Onset  . Alcohol abuse Mother   . Alcohol abuse Father   . Suicidality Paternal Uncle   . Suicidality  Cousin   . Depression Daughter    Social History:  History  Alcohol Use No     History  Drug Use No    History   Social History  . Marital Status: Single    Spouse Name: N/A    Number of Children: N/A  . Years of Education: N/A   Social History Main Topics  . Smoking status: Former Smoker --  1.00 packs/day for 40 years    Types: Cigarettes    Quit date: 03/11/2014  . Smokeless tobacco: Never Used  . Alcohol Use: No  . Drug Use: No  . Sexual Activity: No   Other Topics Concern  . None   Social History Narrative    Past Psychiatric History: Hospitalizations: Major Depression, Suicide attempt  Outpatient Care: Dr Adele Schilder, [facility]  Substance Abuse Care: Denies  Self-Mutilation: Denies  Suicidal Attempts: History of  Violent Behaviors: Denies     Risk to Self: Is patient at risk for suicide?: Yes What has been your use of drugs/alcohol within the last 12 months?: Denies Risk to Others:   Prior Inpatient Therapy:   Prior Outpatient Therapy:    Level of Care:  OP  Hospital Course:    Fernando Carr was admitted for MDD (major depressive disorder), recurrent severe, without psychosis and crisis management.  He was treated with Amantadine for drug induced extrapyramidal symptoms; Risperidone for mood stabilization;  And Zoloft for depression.  Medical problems were identified and treated as needed.  Home medications were restarted as appropriate.  Improvement was monitored by observation and Fernando Carr daily report of symptom reduction.  Emotional and mental status was monitored by daily self inventory reports completed by Fernando Carr and clinical staff.         Fernando Carr was evaluated by the treatment team for stability and plans for continued recovery upon discharge.  He was offered further treatment options upon discharge including Residential, Intensive Outpatient and Outpatient treatment. He/She will follow up with [facility] for medication management and counseling.     Fernando Carr motivation was an integral factor for scheduling further treatment.  Employment, transportation, bed availability, health status, family support, and any pending legal issues were also considered during his hospital stay.  Upon completion of this  admission the patient was both mentally and medically stable for discharge denying suicidal/homicidal ideation, auditory/visual/tactile hallucinations, delusional thoughts and paranoia.       Consults:  psychiatry  Significant Diagnostic Studies:  labs: UDS, ETOH, CBC/Diff, CMET, Urinalysis  Discharge Vitals:   Blood pressure 163/68, pulse 55, temperature 98.1 F (36.7 C), temperature source Oral, resp. rate 20, height 5' 11.5" (1.816 m), weight 91.173 kg (201 lb), SpO2 99 %. Body mass index is 27.65 kg/(m^2). Lab Results:   No results found for this or any previous visit (from the past 72 hour(s)).  Physical Findings: AIMS: Facial and Oral Movements Muscles of Facial Expression: None, normal Lips and Perioral Area: None, normal Jaw: None, normal Tongue: None, normal,Extremity Movements Upper (arms, wrists, hands, fingers): None, normal Lower (legs, knees, ankles, toes): None, normal, Trunk Movements Neck, shoulders, hips: None, normal, Overall Severity Severity of abnormal movements (highest score from questions above): None, normal Incapacitation due to abnormal movements: None, normal Patient's awareness of abnormal movements (rate only patient's report): No Awareness, Dental Status Current problems with teeth and/or dentures?: No Does patient usually wear dentures?: No  CIWA:    COWS:  See Psychiatric Specialty Exam and Suicide Risk Assessment completed by Attending Physician prior to discharge.  Discharge destination:  Home  Is patient on multiple antipsychotic therapies at discharge:  No   Has Patient had three or more failed trials of antipsychotic monotherapy by history:  No    Recommended Plan for Multiple Antipsychotic Therapies: NA  Discharge Instructions    Discharge instructions    Complete by:  As directed   Take all of you medications as prescribed by your mental healthcare provider.  Report any adverse effects and reactions from your medications to  your outpatient provider promptly. Do not engage in alcohol and or illegal drug use while on prescription medicines. In the event of worsening symptoms call the crisis hotline, 911, and or go to the nearest emergency department for appropriate evaluation and treatment of symptoms. Follow-up with your primary care provider for your medical issues, concerns and or health care needs.   Keep all scheduled appointments.  If you are unable to keep an appointment call to reschedule.  Let the nurse know if you will need medications before next scheduled appointment.            Medication List    STOP taking these medications        escitalopram 20 MG tablet  Commonly known as:  LEXAPRO     fenofibrate micronized 67 MG capsule  Commonly known as:  LOFIBRA     fludrocortisone 0.1 MG tablet  Commonly known as:  FLORINEF     oxyCODONE-acetaminophen 5-325 MG per tablet  Commonly known as:  PERCOCET/ROXICET      TAKE these medications      Indication   amantadine 100 MG capsule  Commonly known as:  SYMMETREL  Take 1 capsule (100 mg total) by mouth 2 (two) times daily. For drug induced tremors   Indication:  Extrapyramidal Reaction caused by Medications     aspirin EC 81 MG tablet  Take 1 tablet (81 mg total) by mouth daily.      fenofibrate 54 MG tablet      multivitamin with minerals tablet  Take 1 tablet by mouth daily.      polyethylene glycol powder powder  Commonly known as:  GLYCOLAX/MIRALAX      risperiDONE 0.5 MG tablet  Commonly known as:  RISPERDAL  Take 1 tablet (0.5 mg total) by mouth at bedtime. For mood stabilization   Indication:  mood stabilization     sertraline 100 MG tablet  Commonly known as:  ZOLOFT  Take 1 tablet (100 mg total) by mouth daily. For depression   Indication:  Major Depressive Disorder     simvastatin 10 MG tablet  Commonly known as:  ZOCOR  Take 1 tablet (10 mg total) by mouth daily at 6 PM.   Indication:  Hyperlipidemia            Follow-up Information    Follow up with [facility] On 08/27/2014.   Why:  Appointment with Dr. Adele Schilder on Wednesday, January 27th at 3 pm. Please call office if you need to reschedule.   Contact information:   [address] [place], [place] [address] [phone]      Follow-up recommendations:  Activity:  As tolerated Diet:  As tolerated Other:  Follow up with [facility]  Comments:   Patient has been instructed to take medications as prescribed; and report adverse effects to outpatient provider.  Follow up with primary doctor for any medical issues  and If symptoms recur report to nearest emergency or crisis hot line.    Total Discharge Time: Greater than 30 Minutes  Signed:  Earleen Newport, FNP-BC 08/27/2014, 1:23 PM  I personally assessed the patient and formulated the plan Geralyn Flash A. Sabra Heck, M.D.

## 2014-08-27 NOTE — B[facility] Group Notes (Signed)
   [facility] LCSW Aftercare Discharge Planning Group Note  08/27/2014  8:45 AM   Participation Quality: Alert, Appropriate and Oriented  Mood/Affect: Depressed and Flat  Depression Rating: 0  Anxiety Rating: 2  Thoughts of Suicide: Pt denies SI/HI  Will you contract for safety? Yes  Current AVH: Pt denies  Plan for Discharge/Comments: Pt attended discharge planning group and actively participated in group. CSW provided pt with today's workbook. Patient reports feeling "okay" today. He reports feeling ready for discharge and plans to attend appointment with Dr. Adele Schilder before returning home today. He is also agreeable to speaking with Velva Harman today before his appointment with Dr. Adele Schilder about IOP program.  Transportation Means: Pt reports access to transportation  Supports: Patient has identified his sister as supportive.  Tilden Fossa, MSW, [place] Worker [facility] [phone]

## 2014-08-27 NOTE — Plan of Care (Signed)
Problem: Alteration in mood Goal: STG-Patient is able to discuss feelings and issues (Patient is able to discuss feelings and issues leading to depression)  Outcome: Progressing Client denies depression, affect brighter, up more and visible on the unit. "I'm fine" "yea pretty good"

## 2014-08-27 NOTE — Progress Notes (Signed)
  [facility] Adult Case Management Discharge Plan :  Will you be returning to the same living situation after discharge:  Yes,  patient plans to return to his apartment At discharge, do you have transportation home?: Yes,  patient plans to drive himself home Do you have the ability to pay for your medications: Yes,  patient will be provided with prescriptions at discharge  Release of information consent forms completed and in the chart;  Patient's signature needed at discharge.  Patient to Follow up at: Follow-up Information    Follow up with [facility] On 08/27/2014.   Why:  Appointment with Dr. Adele Schilder on Wednesday, January 27th at 3 pm. Please call office if you need to reschedule.   Contact information:   [address] [place], [place] [address] [phone]      Patient denies SI/HI: Yes,  denies    Safety Planning and Suicide Prevention discussed: Yes,  with patient and sister  Has patient been referred to the Quitline?: Patient refused referral  Fernando Carr, Fernando Carr 08/27/2014, 10:25 AM

## 2014-08-27 NOTE — Progress Notes (Signed)
Discharge note: Pt received both written and verbal discharge instructions. Pt verbalized understanding of discharge instructions. Pt agreed to f/u appt and med regimen. Pt received sample meds, prescriptions and belongings out of locker. Pt taken downstairs to IOP by Alroy Dust, MHT.

## 2014-08-27 NOTE — BHH Suicide Risk Assessment (Signed)
[facility] Discharge Suicide Risk Assessment   Demographic Factors:  Male, Age 74 or older, Divorced or widowed, Caucasian and Living alone  Total Time spent with patient: 30 minutes  Musculoskeletal: Strength & Muscle Tone: within normal limits Gait & Station: normal Patient leans: N/A  Psychiatric Specialty Exam: Physical Exam  Review of Systems  Constitutional: Positive for malaise/fatigue.  HENT: Negative.   Eyes: Negative.   Respiratory: Negative.   Cardiovascular: Negative.   Gastrointestinal: Negative.   Genitourinary: Negative.   Musculoskeletal: Negative.   Skin: Negative.   Neurological: Positive for tremors and weakness.  Endo/Heme/Allergies: Negative.   Psychiatric/Behavioral: Positive for depression. The patient is nervous/anxious.     Blood pressure 163/68, pulse 55, temperature 98.1 F (36.7 C), temperature source Oral, resp. rate 20, height 5' 11.5" (1.816 m), weight 91.173 kg (201 lb), SpO2 99 %.Body mass index is 27.65 kg/(m^2).  General Appearance: Fairly Groomed  Engineer, water::  Fair  Speech:  Clear and Coherent, Slow and not spontaneous409  Volume:  Decreased  Mood:  "better"  Affect:  Appropriate  Thought Process:  Coherent and Goal Directed  Orientation:  Full (Time, Place, and Person)  Thought Content:  plans as he moves on  Suicidal Thoughts:  No  Homicidal Thoughts:  No  Memory:  Immediate;   Fair Recent;   Fair Remote;   Fair  Judgement:  Fair  Insight:  Present  Psychomotor Activity:  Decreased  Concentration:  Fair  Recall:  [employer] of Knowledge:Fair  Language: Fair  Akathisia:  No  Handed:  Right  AIMS (if indicated):     Assets:  Desire for Improvement Resilience Transportation  Sleep:  Number of Hours: 6.75  Cognition: WNL  ADL's:  Intact   Have you used any form of tobacco in the last 30 days? (Cigarettes, Smokeless Tobacco, Cigars, and/or Pipes): No  Has this patient used any form of tobacco in the last 30 days? (Cigarettes,  Smokeless Tobacco, Cigars, and/or Pipes) No  Mental Status Per Nursing Assessment::   On Admission:  Suicidal ideation indicated by patient  Current Mental Status by Physician: In full contact with reality. There are no active SI plans or intent. States he feels better. He is sleeping and eating well. He is planning to see Dr. Adele Schilder today and then go home. He is willing to come to the [place] [facility]. Can see how coming here in the morning for a period of time can further help with the depression, the isolation, and get him more physically active.    Loss Factors: Decline in physical health  Historical Factors: NA  Risk Reduction Factors:   Sense of responsibility to family  Continued Clinical Symptoms:  Depression:   Anhedonia Severe Medical Diagnoses and Treatments/Surgeries  Cognitive Features That Contribute To Risk:  None    Suicide Risk:  Mild:  Suicidal ideation of limited frequency, intensity, duration, and specificity.  There are no identifiable plans, no associated intent, mild dysphoria and related symptoms, good self-control (both objective and subjective assessment), few other risk factors, and identifiable protective factors, including available and accessible social support.  Principal Problem: MDD (major depressive disorder), recurrent severe, without psychosis Discharge Diagnoses:  Patient Active Problem List   Diagnosis Date Noted  . MDD (major depressive disorder), recurrent severe, without psychosis [F33.2] 08/20/2014  . 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 Severe depression [F32.9]   . Major depressive disorder, recurrent, severe without psychotic features [F33.2]   . AAA (abdominal aortic aneurysm) [  I71.4] 07/07/2014  . AKI (acute kidney injury) [N17.9] 03/10/2014  . Dehydration [E86.0] 03/10/2014  . FTT (failure to thrive) in adult [R62.7] 03/10/2014  . 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 Sinus bradycardia [R00.1] 03/10/2014   . HTN (hypertension) [I10] 03/10/2014  . Hypotension, postural [I95.1] 03/09/2014  . CRD (chronic renal disease), stage III [N18.3] 01/09/2014  . Abdominal aortic aneurysm [I71.4] 12/20/2011  . Bipolar 1 disorder [F31.9] 10/24/2011  . Hyperlipemia [E78.5] 10/10/2011    Follow-up Information    Follow up with [facility] On 08/27/2014.   Why:  Appointment with Dr. Adele Schilder on Wednesday, January 27th at 3 pm. Please call office if you need to reschedule.   Contact information:   [address] [place], [place] [address] [phone]      Plan Of Care/Follow-up recommendations:  Activity:  as tolerated Diet:  regular Follow up [facility] Department: Dr. Adele Schilder, [place] [facility] Is patient on multiple antipsychotic therapies at discharge:  No   Has Patient had three or more failed trials of antipsychotic monotherapy by history:  No  Recommended Plan for Multiple Antipsychotic Therapies: NA    Takenya Travaglini A 08/27/2014, 12:58 PM

## 2014-09-01 NOTE — Progress Notes (Signed)
Patient Discharge Instructions:  Next Level Care Provider Has Access to the EMR, 09/01/14  Records provided to [facility] via [facility]/Epic access.  Patsey Berthold, 09/01/2014, 11:40 AM

## 2014-09-02 ENCOUNTER — Other Ambulatory Visit (HOSPITAL_COMMUNITY): Payer: Medicare Other | Attending: Psychiatry | Admitting: Psychiatry

## 2014-09-02 ENCOUNTER — Encounter (HOSPITAL_COMMUNITY): Payer: Self-pay | Admitting: Psychiatry

## 2014-09-02 DIAGNOSIS — F332 Major depressive disorder, recurrent severe without psychotic features: Secondary | ICD-10-CM | POA: Insufficient documentation

## 2014-09-02 DIAGNOSIS — E780 Pure hypercholesterolemia: Secondary | ICD-10-CM | POA: Diagnosis not present

## 2014-09-02 DIAGNOSIS — F331 Major depressive disorder, recurrent, moderate: Secondary | ICD-10-CM

## 2014-09-02 DIAGNOSIS — G4710 Hypersomnia, unspecified: Secondary | ICD-10-CM | POA: Diagnosis not present
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
## 2014-09-02 NOTE — Progress Notes (Signed)
Psychiatric Assessment Adult  Patient Identification:  Fernando Carr Date of Evaluation:  09/02/2014 Chief Complaint: depression History of Chief Complaint:  Fernando Carr is just out of inpatient after having suicidal thoughts.  He had one previous attempt about 6 years ago.  Says he had a repair of an abdominal aneurysm in Dec 2015 and just has not been able to get back to his old self.  His daughter and only child moved to [place] with her boyfriend and her 50 year old.  She is currently pregnant.  He misses her and his grandchild.  Says he has never met the boyfriend.  He lives by himself.  No involvement in church or social groups and says he has no support system.  He is close to his neighbor and the children in that household.  He feels lonely, no energy or motivation, sleeps 9-10 hours daily, eats poorly because he just does not feel like dong anything, has been having crying spells and was having suicidal thoughts.  Currently still feels depressed but is no longer suicidal.  Spends his day watching television and says that does not help.  HPI Review of Systems Physical Exam  Depressive Symptoms: depressed mood, anhedonia, hypersomnia, fatigue, weight loss,  (Hypo) Manic Symptoms:   Elevated Mood:  Negative Irritable Mood:  Yes Grandiosity:  Negative Distractibility:  Negative Labiality of Mood:  Negative Delusions:  Negative Hallucinations:  Negative Impulsivity:  Negative Sexually Inappropriate Behavior:  Negative Financial Extravagance:  Negative Flight of Ideas:  Negative  Anxiety Symptoms: Excessive Worry:  Negative Panic Symptoms:  Negative Agoraphobia:  Negative Obsessive Compulsive: Negative  Symptoms: None, Specific Phobias:  Negative Social Anxiety:  Negative  Psychotic Symptoms:  Hallucinations: Negative None Delusions:  Negative Paranoia:  Negative   Ideas of Reference:  Negative  PTSD Symptoms: Ever had a traumatic exposure:  Negative Had a traumatic  exposure in the last month:  Negative Re-experiencing: Negative None Hypervigilance:  Negative Hyperarousal: Negative None Avoidance: Negative None  Traumatic Brain Injury: Negative na  Past Psychiatric History: Diagnosis: Major depression, recurrent, severe without psychotic symptoms  Hospitalizations: 2  Outpatient Care: sees psychiatrist but not currently a therapist  Substance Abuse Care: none  Self-Mutilation: none  Suicidal Attempts: 1  Violent Behaviors: none   Past Medical History:   Past Medical History  Diagnosis Date  . High cholesterol   . Bipolar disorder   . AAA (abdominal aortic aneurysm)   . Depression   . Renal insufficiency     chronic renal   . Suicide attempt aug. 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 AAA (abdominal aortic aneurysm)    History of Loss of Consciousness:  Negative Seizure History:  Negative Cardiac History:  Yes Allergies:  No Known Allergies Current Medications:  Current Outpatient Prescriptions  Medication Sig Dispense Refill  . amantadine (SYMMETREL) 100 MG capsule Take 1 capsule (100 mg total) by mouth 2 (two) times daily. For drug induced tremors 30 capsule 0  . aspirin EC 81 MG tablet Take 1 tablet (81 mg total) by mouth daily. (Patient not taking: Reported on 08/18/2014) 30 tablet 0  . fenofibrate 54 MG tablet     . Multiple Vitamins-Minerals (MULTIVITAMIN WITH MINERALS) tablet Take 1 tablet by mouth daily.    . polyethylene glycol powder (GLYCOLAX/MIRALAX) powder     . risperiDONE (RISPERDAL) 0.5 MG tablet Take 1 tablet (0.5 mg total) by mouth at bedtime. For mood stabilization 30 tablet 0  .  sertraline (ZOLOFT) 100 MG tablet Take 1 tablet (100 mg total) by mouth daily. For depression 30 tablet 0  . simvastatin (ZOCOR) 10 MG tablet Take 1 tablet (10 mg total) by mouth daily at 6 PM.     No current facility-administered medications for this visit.    Previous Psychotropic Medications:  Medication Dose    see list above  na                     Substance Abuse History in the last 12 months:smoked some after the surgery but has stopped again                                                                         Others:                          Medical Consequences of Substance Abuse: none  Legal Consequences of Substance Abuse: had 2 DUI's before age 43 and stopped drinking at that time  Family Consequences of Substance Abuse: none  Blackouts:  Negative DT's:  Negative Withdrawal Symptoms:  Negative na  Social History: Current Place of Residence: [place] Place of Birth: [place] Family Members: daughter aged 5 Marital Status:  Single Children: 1  Sons: 0  Daughters: 1 Relationships: no close relationships Education:  Dentist Problems/Performance: good Religious Beliefs/Practices: none History of Abuse: none Ship broker History:  did not ask him Legal History: none Hobbies/Interests: used to play golf  Family History:   Family History  Problem Relation Age of Onset  . Alcohol abuse Mother   . Alcohol abuse Father   . Suicidality Paternal Uncle   . Suicidality Cousin   . Depression Daughter     Mental Status Examination/Evaluation: Objective:  Appearance: Fairly Groomed  Engineer, water::  Good  Speech:  Clear and Coherent  Volume:  Normal  Mood:  depressed  Affect:  Appropriate  Thought Process:  Coherent and Logical  Orientation:  Full (Time, Place, and Person)  Thought Content:  Negative  Suicidal Thoughts:  No  Homicidal Thoughts:  No  Judgement:  Good  Insight:  Fair  Psychomotor Activity:  Normal  Akathisia:  Negative  Handed:  Right  AIMS (if indicated):  0  Assets:  Communication Skills Desire for Improvement Financial Resources/Insurance Housing Physical Health Talents/Skills Transportation Vocational/Educational    Laboratory/X-Ray Psychological Evaluation(s)   none  none   Assessment:  Major  depression, recurrent, severe without psychotic features                  Treatment Plan/Recommendations:  Plan of Care: daily group therapy  Laboratory:  none  Psychotherapy: group therapy  Medications: continue current meds  Routine PRN Medications:  Yes  Consultations: none  Safety Concerns:  none  Other:      Clarene Reamer, MD 2/2/201611:33 AM

## 2014-09-03 ENCOUNTER — Other Ambulatory Visit (HOSPITAL_COMMUNITY): Payer: Medicare Other | Admitting: Psychiatry

## 2014-09-03 DIAGNOSIS — F332 Major depressive disorder, recurrent severe without psychotic features: Secondary | ICD-10-CM | POA: Diagnosis not present

## 2014-09-03 DIAGNOSIS — G4710 Hypersomnia, unspecified: Secondary | ICD-10-CM | POA: Diagnosis not present

## 2014-09-03 DIAGNOSIS — E780 Pure hypercholesterolemia: Secondary | ICD-10-CM | POA: Diagnosis not present

## 2014-09-03 DIAGNOSIS — F331 Major depressive disorder, recurrent, moderate: Secondary | ICD-10-CM

## 2014-09-03 NOTE — Progress Notes (Signed)
    Daily Group Progress Note  Program: IOP  Group Time: 9:00-10:30  Participation Level: Active  Behavioral Response: Appropriate  Type of Therapy:  Group Therapy  Summary of Progress: Pt. Met with case manager and psychiatrist.     Group Time: 10:30-12:00  Participation Level:  Minimal  Behavioral Response: Appropriate  Type of Therapy: Psycho-education Group  Summary of Progress: It is suggested that the Pt. be switched to an elderly group so that he can relate. Pt. learned about the dangers of comparison and ways to boost self-confidence. Pt. was able to talk about some of the positive traits he identifies with, but was not sure why.  Nancie Neas, LPC

## 2014-09-04 ENCOUNTER — Other Ambulatory Visit (HOSPITAL_COMMUNITY): Payer: Medicare Other | Admitting: Psychiatry

## 2014-09-04 DIAGNOSIS — E780 Pure hypercholesterolemia: Secondary | ICD-10-CM | POA: Diagnosis not present

## 2014-09-04 DIAGNOSIS — F331 Major depressive disorder, recurrent, moderate: Secondary | ICD-10-CM

## 2014-09-04 DIAGNOSIS — G4710 Hypersomnia, unspecified: Secondary | ICD-10-CM | POA: Diagnosis not present

## 2014-09-04 DIAGNOSIS — F332 Major depressive disorder, recurrent severe without psychotic features: Secondary | ICD-10-CM | POA: Diagnosis not present

## 2014-09-04 NOTE — Progress Notes (Signed)
    Daily Group Progress Note  Program: IOP  Group Time: 9:00-10:30  Participation Level: Active  Behavioral Response: Appropriate  Type of Therapy:  Group Therapy  Summary of Progress: Pt. Reported that he was feeling "ok" went to lunch yesterday at [place] and [place]. Pt discussed significant stressors including relationship with his daughter and loneliness.     Group Time: 10:30-12:00  Participation Level:  Active  Behavioral Response: Appropriate  Type of Therapy: Psycho-education Group  Summary of Progress: Pt. Participated in journal prompt exercise "If I believed I was enough..>"  Nancie Neas, LPC

## 2014-09-04 NOTE — Progress Notes (Signed)
    Daily Group Progress Note  Program: IOP  Group Time: 9:00-10:30  Participation Level: Minimal  Behavioral Response: Appropriate  Type of Therapy:  Group Therapy  Summary of Progress: Pt. Reported that he was feeling "ok" and that he felt "lost emotionally". Pt. Reported that he thinks he sleeps too much and has forgotten what he likes to do to keep himself busy. Pt. Reported that he has lost about 80 pounds in the last year. Pt. Reported that he enjoys going to lunch everyday.     Group Time: 10:30-12:00  Participation Level:  Minimal  Behavioral Response: Appropriate  Type of Therapy: Psycho-education Group  Summary of Progress: Pt. Watched Marilynn Latino video and was alert during discussion of developing self-compassion.   Nancie Neas, LPC

## 2014-09-05 ENCOUNTER — Other Ambulatory Visit (HOSPITAL_COMMUNITY): Payer: Medicare Other | Admitting: Psychiatry

## 2014-09-05 DIAGNOSIS — E780 Pure hypercholesterolemia: Secondary | ICD-10-CM | POA: Diagnosis not present

## 2014-09-05 DIAGNOSIS — F332 Major depressive disorder, recurrent severe without psychotic features: Secondary | ICD-10-CM | POA: Diagnosis not present

## 2014-09-05 DIAGNOSIS — F331 Major depressive disorder, recurrent, moderate: Secondary | ICD-10-CM

## 2014-09-05 DIAGNOSIS — G4710 Hypersomnia, unspecified: Secondary | ICD-10-CM | POA: Diagnosis not present

## 2014-09-05 NOTE — Progress Notes (Signed)
    Daily Group Progress Note  Program: IOP  Group Time: 9:00-10:30  Participation Level: Active  Behavioral Response: Appropriate  Type of Therapy:  Group Therapy  Summary of Progress: Pt. Continues to present as depressed. Pt. Is talkative when prompted and reports that his daily activity is going out to eat for lunch. Pt discussed frustration and feeling "lost" and "like a nut" with loss of his cell phone and needing help from his neighbor to have it reconnected.      Group Time: 10:30-12:00  Participation Level:  Active  Behavioral Response: Appropriate  Type of Therapy: Psycho-education Group  Summary of Progress: Pt. Was alert and attentive during grief and loss group. Pt. Discussed loss of connection with 2 year old granddaughter after his daughter moved out of state.   Nancie Neas, LPC

## 2014-09-08 ENCOUNTER — Other Ambulatory Visit (HOSPITAL_COMMUNITY): Payer: Medicare Other | Admitting: Psychiatry

## 2014-09-08 DIAGNOSIS — F331 Major depressive disorder, recurrent, moderate: Secondary | ICD-10-CM

## 2014-09-08 DIAGNOSIS — F332 Major depressive disorder, recurrent severe without psychotic features: Secondary | ICD-10-CM | POA: Diagnosis not present

## 2014-09-08 DIAGNOSIS — G4710 Hypersomnia, unspecified: Secondary | ICD-10-CM | POA: Diagnosis not present

## 2014-09-08 DIAGNOSIS — E780 Pure hypercholesterolemia: Secondary | ICD-10-CM | POA: Diagnosis not present

## 2014-09-09 ENCOUNTER — Other Ambulatory Visit (HOSPITAL_COMMUNITY): Payer: Medicare Other

## 2014-09-09 ENCOUNTER — Telehealth (HOSPITAL_COMMUNITY): Payer: Self-pay | Admitting: Psychiatry

## 2014-09-10 ENCOUNTER — Other Ambulatory Visit (HOSPITAL_COMMUNITY): Payer: Medicare Other | Admitting: Psychiatry

## 2014-09-10 DIAGNOSIS — E780 Pure hypercholesterolemia: Secondary | ICD-10-CM | POA: Diagnosis not present

## 2014-09-10 DIAGNOSIS — F331 Major depressive disorder, recurrent, moderate: Secondary | ICD-10-CM

## 2014-09-10 DIAGNOSIS — F332 Major depressive disorder, recurrent severe without psychotic features: Secondary | ICD-10-CM | POA: Diagnosis not present

## 2014-09-10 DIAGNOSIS — G4710 Hypersomnia, unspecified: Secondary | ICD-10-CM | POA: Diagnosis not present

## 2014-09-10 NOTE — Progress Notes (Signed)
    Daily Group Progress Note  Program: IOP  Group Time: 9:00-10:30  Participation Level: Active  Behavioral Response: Appropriate  Type of Therapy:  Group Therapy  Summary of Progress: Pt. was active in group. Summary of progress: Pt. learned about various mental health medications with the pharmacist [place].      Group Time: 10:30-12:00  Participation Level:  Active  Behavioral Response: Appropriate  Type of Therapy: Psycho-education Group  Summary of Progress: Pt. was active in group. Pt. learned about symptoms of panic attacks and what causes them to occur and treatment options. Pt. was not able to recall how family members or others unintentionally say negative comments about depression and anxiety. Pt. watched a video on what a panic attack can look like.   Nancie Neas, LPC

## 2014-09-11 ENCOUNTER — Other Ambulatory Visit (HOSPITAL_COMMUNITY): Payer: Medicare Other | Admitting: Psychiatry

## 2014-09-11 DIAGNOSIS — F331 Major depressive disorder, recurrent, moderate: Secondary | ICD-10-CM

## 2014-09-11 DIAGNOSIS — F332 Major depressive disorder, recurrent severe without psychotic features: Secondary | ICD-10-CM | POA: Diagnosis not present

## 2014-09-11 DIAGNOSIS — E780 Pure hypercholesterolemia: Secondary | ICD-10-CM | POA: Diagnosis not present

## 2014-09-11 DIAGNOSIS — G4710 Hypersomnia, unspecified: Secondary | ICD-10-CM | POA: Diagnosis not present

## 2014-09-11 NOTE — Progress Notes (Signed)
    Daily Group Progress Note  Program: IOP  Group Time: 9:00-10:30  Participation Level: Active  Behavioral Response: Appropriate  Type of Therapy:  Group Therapy  Summary of Progress: Pt. Reported that he continues to go to lunch daily and enjoys looking for new places to eat. Pt. Continues to benefit from social support and interaction provided by neighbors and their children.      Group Time: 10:30-12:00  Participation Level:  Active  Behavioral Response: Appropriate  Type of Therapy: Psycho-education Group  Summary of Progress: Pt. Participated in progressive muscle relaxation exercise.   Nancie Neas, LPC

## 2014-09-12 ENCOUNTER — Other Ambulatory Visit (HOSPITAL_COMMUNITY): Payer: Medicare Other | Admitting: Psychiatry

## 2014-09-12 DIAGNOSIS — F332 Major depressive disorder, recurrent severe without psychotic features: Secondary | ICD-10-CM | POA: Diagnosis not present

## 2014-09-12 DIAGNOSIS — E780 Pure hypercholesterolemia: Secondary | ICD-10-CM | POA: Diagnosis not present

## 2014-09-12 DIAGNOSIS — F331 Major depressive disorder, recurrent, moderate: Secondary | ICD-10-CM

## 2014-09-12 DIAGNOSIS — G4710 Hypersomnia, unspecified: Secondary | ICD-10-CM | POA: Diagnosis not present

## 2014-09-12 NOTE — Progress Notes (Signed)
Fernando Carr is a 74 y.o., single, Caucasian Male, who was transitioned from the inpatient unit [facility]).  Pt has been attending MH-IOP daily.  Plans to discharge 09-16-14. Will refer pt to the senior center (downtown) in [place], [place].   A:  Placed call to [employer] (Sr Government social research officer) at [phone]; left vm inquiring about steps to refer a patient.  R:  Pt receptive.

## 2014-09-12 NOTE — Progress Notes (Signed)
    Daily Group Progress Note  Program: IOP  Group Time: 9:00-10:30  Participation Level: Active  Behavioral Response: Appropriate  Type of Therapy:  Group Therapy  Summary of Progress: Pt. Reported that he did not have plans for the weekend, and that weekends can be lonely for him. Pt. Was alert and attentive, but did not share during group.      Group Time: 10:30-12:00  Participation Level:  None  Behavioral Response: n/a  Type of Therapy: Psycho-education Group  Summary of Progress: Pt. Left during break and did not attend the grief and loss group.  [facility]-PIOPB PSYCH

## 2014-09-12 NOTE — Progress Notes (Signed)
    Daily Group Progress Note  Program: IOP  Group Time: 9:00-10:30  Participation Level: Active  Behavioral Response: Appropriate  Type of Therapy:  Group Therapy  Summary of Progress: Pt. Continues to present as alert and attentive, participates when prompted. Pt. Reports that he sleeps well and that he has a good appetite. Pt.'s major outing is going out to lunch everyday. Pt. Discussed that the last year has been difficult due to multiple hospitalizations and loneliness.      Group Time: 10:30-12:00  Participation Level:  Active  Behavioral Response: Appropriate  Type of Therapy: Group Therapy  Summary of Progress: Pt. Discussed goal of developing social outlets such as mental health association and support groups. Pt. Was able to identify as protective factors his relationships with his neighbors and desire to return to work.  Nancie Neas, LPC

## 2014-09-15 ENCOUNTER — Other Ambulatory Visit (HOSPITAL_COMMUNITY): Payer: Medicare Other

## 2014-09-16 ENCOUNTER — Other Ambulatory Visit (HOSPITAL_COMMUNITY): Payer: Medicare Other | Admitting: Psychiatry

## 2014-09-16 DIAGNOSIS — I714 Abdominal aortic aneurysm, without rupture: Secondary | ICD-10-CM | POA: Diagnosis not present

## 2014-09-16 DIAGNOSIS — I129 Hypertensive chronic kidney disease with stage 1 through stage 4 chronic kidney disease, or unspecified chronic kidney disease: Secondary | ICD-10-CM | POA: Diagnosis not present

## 2014-09-16 DIAGNOSIS — R7989 Other specified abnormal findings of blood chemistry: Secondary | ICD-10-CM | POA: Diagnosis not present

## 2014-09-16 DIAGNOSIS — G4710 Hypersomnia, unspecified: Secondary | ICD-10-CM | POA: Diagnosis not present

## 2014-09-16 DIAGNOSIS — F339 Major depressive disorder, recurrent, unspecified: Secondary | ICD-10-CM

## 2014-09-16 DIAGNOSIS — N189 Chronic kidney disease, unspecified: Secondary | ICD-10-CM | POA: Diagnosis not present

## 2014-09-16 DIAGNOSIS — F332 Major depressive disorder, recurrent severe without psychotic features: Secondary | ICD-10-CM | POA: Diagnosis not present

## 2014-09-16 DIAGNOSIS — E780 Pure hypercholesterolemia: Secondary | ICD-10-CM | POA: Diagnosis not present

## 2014-09-16 MED ORDER — AMANTADINE HCL 100 MG PO CAPS: 100.0000 mg | ORAL_CAPSULE | Freq: Two times a day (BID) | ORAL | Status: DC

## 2014-09-16 NOTE — Progress Notes (Signed)
Patient ID: Fernando Carr, male   DOB: 1940/10/02, 74 y.o.   MRN: [id number] Refill medication only, no visit

## 2014-09-17 ENCOUNTER — Other Ambulatory Visit (HOSPITAL_COMMUNITY): Payer: Medicare Other | Admitting: Psychiatry

## 2014-09-17 DIAGNOSIS — E780 Pure hypercholesterolemia: Secondary | ICD-10-CM | POA: Diagnosis not present

## 2014-09-17 DIAGNOSIS — G4710 Hypersomnia, unspecified: Secondary | ICD-10-CM | POA: Diagnosis not present

## 2014-09-17 DIAGNOSIS — F339 Major depressive disorder, recurrent, unspecified: Secondary | ICD-10-CM

## 2014-09-17 DIAGNOSIS — F332 Major depressive disorder, recurrent severe without psychotic features: Secondary | ICD-10-CM | POA: Diagnosis not present

## 2014-09-17 NOTE — Progress Notes (Signed)
    Daily Group Progress Note  Program: IOP  Group Time: 9:00-10:30  Participation Level: Active  Behavioral Response: Appropriate  Type of Therapy:  Group Therapy  Summary of Progress: Pt. Reported that she was doing "good". Pt. Continues to find social interaction in daily outings for lunch, but acknowledges the need for more opportunities for social activities.      Group Time: 10:30-12:00  Participation Level:  Active  Behavioral Response: Appropriate  Type of Therapy: Psycho-education Group  Summary of Progress: Pt. Participated in guided meditation activity "magical color shower".  Nancie Neas, LPC

## 2014-09-18 ENCOUNTER — Other Ambulatory Visit (HOSPITAL_COMMUNITY): Payer: Medicare Other | Admitting: Psychiatry

## 2014-09-18 DIAGNOSIS — F339 Major depressive disorder, recurrent, unspecified: Secondary | ICD-10-CM

## 2014-09-18 DIAGNOSIS — G4710 Hypersomnia, unspecified: Secondary | ICD-10-CM | POA: Diagnosis not present

## 2014-09-18 DIAGNOSIS — E780 Pure hypercholesterolemia: Secondary | ICD-10-CM | POA: Diagnosis not present

## 2014-09-18 DIAGNOSIS — F332 Major depressive disorder, recurrent severe without psychotic features: Secondary | ICD-10-CM | POA: Diagnosis not present

## 2014-09-18 NOTE — Progress Notes (Signed)
    Daily Group Progress Note  Program: IOP  Group Time: 9:00-10:30  Participation Level: Active  Behavioral Response: Appropriate  Type of Therapy:  Group Therapy  Summary of Progress: Pt. Discussed readiness for discharge. Pt. Discussed attempting to understand the "why" of his depression. Pt. Discussed enjoying swimming and the need to find opportunities to swim as a part of his self-care.      Group Time: 10:30-12:00  Participation Level:  Active  Behavioral Response: Appropriate  Type of Therapy: Psycho-education Group  Summary of Progress: Pt. Participated in discussion about blame versus accountability in the recovery process.   Nancie Neas, LPC

## 2014-09-18 NOTE — Telephone Encounter (Signed)
A:  Placed second phone call [phone]) to [employer] (Sr. Ctr. Director) to refer patient.  Left vm for Ms. Hardin Negus to Microbiologist.
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
## 2014-09-18 NOTE — Progress Notes (Signed)
    Daily Group Progress Note  Program: IOP  Group Time: 9:00-10:30  Participation Level: Active  Behavioral Response: Appropriate  Type of Therapy:  Group Therapy  Summary of Progress: Pt. Was active in group. Pt reports hibernating during the weekend and found comfort in doing so.    Group Time: 10:30-12:00  Participation Level:  Active  Behavioral Response: Appropriate  Type of Therapy: Psycho-education Group  Summary of Progress: Pt was active in group. Pt learned about the importance of self-care, self-care activities, and the importance of saying no to things that bring negativity and cause burnout. Reports swimming as a self-care activity.  Nancie Neas, LPC

## 2014-09-19 ENCOUNTER — Other Ambulatory Visit (HOSPITAL_COMMUNITY): Payer: Medicare Other | Admitting: Psychiatry

## 2014-09-19 DIAGNOSIS — F339 Major depressive disorder, recurrent, unspecified: Secondary | ICD-10-CM

## 2014-09-19 DIAGNOSIS — F332 Major depressive disorder, recurrent severe without psychotic features: Secondary | ICD-10-CM | POA: Diagnosis not present

## 2014-09-19 DIAGNOSIS — E780 Pure hypercholesterolemia: Secondary | ICD-10-CM | POA: Diagnosis not present

## 2014-09-19 DIAGNOSIS — G4710 Hypersomnia, unspecified: Secondary | ICD-10-CM | POA: Diagnosis not present

## 2014-09-19 NOTE — Patient Instructions (Signed)
Patient completed MH-IOP today.  Will follow up with Dr. Adele Schilder on 09-24-14 @ 2:30 pm and Audelia Acton, LCSW on 10-27-14 @ 10 am.  Encouraged support groups.  Patient to follow up with [facility] (downtown) [phone].

## 2014-09-19 NOTE — Progress Notes (Signed)
    Daily Group Progress Note  Program: IOP  Group Time: 9:00-10:30  Participation Level: Active  Behavioral Response: Appropriate  Type of Therapy:  Group Therapy  Summary of Progress: Pt. Prepared for discharge. Pt. Stated that he was "doing ok", smiled appropriately. Pt. Reported that he was looking forward to discharge and getting involved with senior day program. Pt. Reported that his mood is often better in the afternoons.     Group Time: 10:30-12:00  Participation Level:  Active  Behavioral Response: Appropriate  Type of Therapy: Psycho-education Group  Summary of Progress: Pt. Participated in grief and loss facilitated by Jeanella Craze.   Nancie Neas, LPC
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
## 2014-09-22 ENCOUNTER — Other Ambulatory Visit (HOSPITAL_COMMUNITY): Payer: Medicare Other

## 2014-09-23 ENCOUNTER — Other Ambulatory Visit (HOSPITAL_COMMUNITY): Payer: Medicare Other

## 2014-09-23 NOTE — Telephone Encounter (Signed)
D:  Placed call to patient to inform him that Probation officer received a call from [employer] at [place]).  According to pt, he had spoken to her about the center.  "I told her that I am interested in computer classes and she put me on a waiting list."  A:  Encouraged pt to follow through and complete registration so he can attend other classes.  Instructed patient to call Amber as soon as possible to complete registration.  R:  Pt receptive.

## 2014-09-24 ENCOUNTER — Other Ambulatory Visit (HOSPITAL_COMMUNITY): Payer: Medicare Other

## 2014-09-24 ENCOUNTER — Encounter (HOSPITAL_COMMUNITY): Payer: Self-pay | Admitting: Psychiatry

## 2014-09-24 ENCOUNTER — Ambulatory Visit (INDEPENDENT_AMBULATORY_CARE_PROVIDER_SITE_OTHER): Payer: Medicare Other | Admitting: Psychiatry

## 2014-09-24 VITALS — BP 103/64 | HR 84 | Ht 71.26 in | Wt 200.2 lb

## 2014-09-24 DIAGNOSIS — F319 Bipolar disorder, unspecified: Secondary | ICD-10-CM | POA: Diagnosis not present

## 2014-09-24 MED ORDER — RISPERIDONE 0.5 MG PO TABS: 0.5000 mg | ORAL_TABLET | Freq: Every day | ORAL | Status: DC

## 2014-09-24 MED ORDER — AMANTADINE HCL 100 MG PO CAPS: 100.0000 mg | ORAL_CAPSULE | Freq: Two times a day (BID) | ORAL | Status: DC

## 2014-09-24 MED ORDER — SERTRALINE HCL 100 MG PO TABS: 100.0000 mg | ORAL_TABLET | Freq: Every day | ORAL | Status: DC

## 2014-09-24 MED ORDER — AMANTADINE HCL 100 MG PO CAPS: 100.0000 mg | ORAL_CAPSULE | Freq: Every day | ORAL | Status: DC

## 2014-09-24 NOTE — Progress Notes (Signed)
[facility] [phone] Progress Note  Fernando Carr [id number] 74 y.o.  09/24/2014 3:08 PM  Chief Complaint:  I am feeling better.  I just finished intensive outpatient program.                History of Present Illness: Fernando Carr came for his followup appointment.  He recently finished intensive outpatient program.  He was admitted to behavioral [place] due to suicidal thoughts .  Now he is taking Zoloft and amantadine and his Risperdal was continued.  He sleeping better.  He denies any crying spells, racing thoughts or any feeling of hopelessness.  However he still have a lot of overwhelming stress and financial strain.  He has to paid off car loans and he is worried about it.  His daughter had a baby boy in December and he has not seen him because she lives in [place].  Patient denies any hallucination or any paranoia.  He wants to continue his current psychotropic medication.  His blood pressure is more stable however his attention and concentration remains distracted at times.  He continued to endorse low energy level which could be due to his multiple health issues .  His tremors are much improved since he's taking amantadine 100 mg twice a day.  Sometime he has some dry mouth but he has no other concern.  His appetite is okay.  His weight is a stable.  Patient denies drinking or using any illegal substances. He lives alone.  Suicidal Ideation: No Plan Formed: No Patient has means to carry out plan: No  Homicidal Ideation: No Plan Formed: No Patient has means to carry out plan: No  Review of Systems: Psychiatric: Agitation: No Hallucination: No Depressed Mood: No Insomnia: Yes Hypersomnia: No Altered Concentration: No Feels Worthless: No Grandiose Ideas: No Belief In Special Powers: No New/Increased Substance Abuse: No Compulsions: No  Neurologic: Headache: No Seizure: No Paresthesias: No  Medical History:  Patient has a history of hyperlipidemia, obesity and  recently started seeing Dr. Deforest Hoyles at [facility].    Outpatient Encounter Prescriptions as of 09/24/2014  Medication Sig  . amantadine (SYMMETREL) 100 MG capsule Take 1 capsule (100 mg total) by mouth 2 (two) times daily. For drug induced tremors  . aspirin EC 81 MG tablet Take 1 tablet (81 mg total) by mouth daily.  . fenofibrate 54 MG tablet   . Multiple Vitamins-Minerals (MULTIVITAMIN WITH MINERALS) tablet Take 1 tablet by mouth daily.  . polyethylene glycol powder (GLYCOLAX/MIRALAX) powder   . risperiDONE (RISPERDAL) 0.5 MG tablet Take 1 tablet (0.5 mg total) by mouth at bedtime. For mood stabilization  . sertraline (ZOLOFT) 100 MG tablet Take 1 tablet (100 mg total) by mouth daily. For depression  . simvastatin (ZOCOR) 10 MG tablet Take 1 tablet (10 mg total) by mouth daily at 6 PM.  . [DISCONTINUED] amantadine (SYMMETREL) 100 MG capsule Take 1 capsule (100 mg total) by mouth 2 (two) times daily. For drug induced tremors  . [DISCONTINUED] amantadine (SYMMETREL) 100 MG capsule Take 1 capsule (100 mg total) by mouth daily. For drug induced tremors  . [DISCONTINUED] risperiDONE (RISPERDAL) 0.5 MG tablet Take 1 tablet (0.5 mg total) by mouth at bedtime. For mood stabilization  . [DISCONTINUED] sertraline (ZOLOFT) 100 MG tablet Take 1 tablet (100 mg total) by mouth daily. For depression   Recent Results (from the past 2160 hour(s))  CBC     Status: Abnormal   Collection Time: 07/07/14 10:59 AM  Result Value  Ref Range   WBC 14.5 (H) 4.0 - 10.5 K/uL   RBC 4.11 (L) 4.22 - 5.81 MIL/uL   Hemoglobin 12.0 (L) 13.0 - 17.0 g/dL   HCT 36.5 (L) 39.0 - 52.0 %   MCV 88.8 78.0 - 100.0 fL   MCH 29.2 26.0 - 34.0 pg   MCHC 32.9 30.0 - 36.0 g/dL   RDW 13.0 11.5 - 15.5 %   Platelets 124 (L) 150 - 400 K/uL  Basic metabolic panel     Status: Abnormal   Collection Time: 07/07/14 10:59 AM  Result Value Ref Range   Sodium 140 137 - 147 mEq/L   Potassium 4.1 3.7 - 5.3 mEq/L   Chloride 106 96 - 112  mEq/L   CO2 25 19 - 32 mEq/L   Glucose, Bld 134 (H) 70 - 99 mg/dL   BUN 21 6 - 23 mg/dL   Creatinine, Ser 1.13 0.50 - 1.35 mg/dL   Calcium 9.7 8.4 - 10.5 mg/dL   GFR calc non Af Amer 63 (L) >90 mL/min   GFR calc Af Amer 73 (L) >90 mL/min    Comment: (NOTE) The eGFR has been calculated using the CKD EPI equation. This calculation has not been validated in all clinical situations. eGFR's persistently <90 mL/min signify possible Chronic Kidney Disease.    Anion gap 9 5 - 15  Protime-INR     Status: Abnormal   Collection Time: 07/07/14 10:59 AM  Result Value Ref Range   Prothrombin Time 15.5 (H) 11.6 - 15.2 seconds   INR 1.21 0.00 - 1.49  APTT     Status: None   Collection Time: 07/07/14 10:59 AM  Result Value Ref Range   aPTT 33 24 - 37 seconds  Magnesium     Status: None   Collection Time: 07/07/14 10:59 AM  Result Value Ref Range   Magnesium 1.7 1.5 - 2.5 mg/dL  Blood gas, arterial     Status: Abnormal   Collection Time: 07/07/14 11:20 AM  Result Value Ref Range   O2 Content 10.0 L/min   Delivery systems OXYGEN MASK    pH, Arterial 7.285 (L) 7.350 - 7.450   pCO2 arterial 53.2 (H) 35.0 - 45.0 mmHg   pO2, Arterial 121.0 (H) 80.0 - 100.0 mmHg   Bicarbonate 24.7 (H) 20.0 - 24.0 mEq/L   TCO2 26.4 0 - 100 mmol/L   Acid-base deficit 1.2 0.0 - 2.0 mmol/L   O2 Saturation 98.3 %   Patient temperature 97.4    Collection site A-LINE    Drawn by COLLECTED BY RT    Sample type ARTERIAL DRAW    Allens test (pass/fail) PASS PASS  CBC     Status: Abnormal   Collection Time: 07/08/14  4:00 AM  Result Value Ref Range   WBC 8.8 4.0 - 10.5 K/uL   RBC 4.02 (L) 4.22 - 5.81 MIL/uL   Hemoglobin 11.6 (L) 13.0 - 17.0 g/dL   HCT 36.2 (L) 39.0 - 52.0 %   MCV 90.0 78.0 - 100.0 fL   MCH 28.9 26.0 - 34.0 pg   MCHC 32.0 30.0 - 36.0 g/dL   RDW 13.2 11.5 - 15.5 %   Platelets 123 (L) 150 - 400 K/uL  Comprehensive metabolic panel     Status: Abnormal   Collection Time: 07/08/14  4:00 AM  Result  Value Ref Range   Sodium 140 137 - 147 mEq/L   Potassium 4.6 3.7 - 5.3 mEq/L   Chloride 107 96 - 112  mEq/L   CO2 24 19 - 32 mEq/L   Glucose, Bld 130 (H) 70 - 99 mg/dL   BUN 15 6 - 23 mg/dL   Creatinine, Ser 1.02 0.50 - 1.35 mg/dL   Calcium 9.7 8.4 - 10.5 mg/dL   Total Protein 5.6 (L) 6.0 - 8.3 g/dL   Albumin 2.8 (L) 3.5 - 5.2 g/dL   AST 13 0 - 37 U/L   ALT 8 0 - 53 U/L   Alkaline Phosphatase 71 39 - 117 U/L   Total Bilirubin 0.4 0.3 - 1.2 mg/dL   GFR calc non Af Amer 71 (L) >90 mL/min   GFR calc Af Amer 82 (L) >90 mL/min    Comment: (NOTE) The eGFR has been calculated using the CKD EPI equation. This calculation has not been validated in all clinical situations. eGFR's persistently <90 mL/min signify possible Chronic Kidney Disease.    Anion gap 9 5 - 15  Magnesium     Status: None   Collection Time: 07/08/14  4:00 AM  Result Value Ref Range   Magnesium 1.6 1.5 - 2.5 mg/dL  Amylase     Status: None   Collection Time: 07/08/14  4:00 AM  Result Value Ref Range   Amylase 98 0 - 105 U/L  Acetaminophen level     Status: Abnormal   Collection Time: 08/18/14  2:14 PM  Result Value Ref Range   Acetaminophen (Tylenol), Serum <10.0 (L) 10 - 30 ug/mL    Comment:        THERAPEUTIC CONCENTRATIONS VARY SIGNIFICANTLY. A RANGE OF 10-30 ug/mL MAY BE AN EFFECTIVE CONCENTRATION FOR MANY PATIENTS. HOWEVER, SOME ARE BEST TREATED AT CONCENTRATIONS OUTSIDE THIS RANGE. ACETAMINOPHEN CONCENTRATIONS >150 ug/mL AT 4 HOURS AFTER INGESTION AND >50 ug/mL AT 12 HOURS AFTER INGESTION ARE OFTEN ASSOCIATED WITH TOXIC REACTIONS.   CBC     Status: Abnormal   Collection Time: 08/18/14  2:14 PM  Result Value Ref Range   WBC 8.5 4.0 - 10.5 K/uL   RBC 4.56 4.22 - 5.81 MIL/uL   Hemoglobin 12.9 (L) 13.0 - 17.0 g/dL   HCT 40.6 39.0 - 52.0 %   MCV 89.0 78.0 - 100.0 fL   MCH 28.3 26.0 - 34.0 pg   MCHC 31.8 30.0 - 36.0 g/dL   RDW 13.3 11.5 - 15.5 %   Platelets 185 150 - 400 K/uL  Comprehensive  metabolic panel     Status: Abnormal   Collection Time: 08/18/14  2:14 PM  Result Value Ref Range   Sodium 138 135 - 145 mmol/L    Comment: Please note change in reference range.   Potassium 4.1 3.5 - 5.1 mmol/L    Comment: Please note change in reference range.   Chloride 105 96 - 112 mEq/L   CO2 26 19 - 32 mmol/L   Glucose, Bld 94 70 - 99 mg/dL   BUN 21 6 - 23 mg/dL   Creatinine, Ser 1.20 0.50 - 1.35 mg/dL   Calcium 10.7 (H) 8.4 - 10.5 mg/dL   Total Protein 7.7 6.0 - 8.3 g/dL   Albumin 4.4 3.5 - 5.2 g/dL   AST 16 0 - 37 U/L   ALT 12 0 - 53 U/L   Alkaline Phosphatase 99 39 - 117 U/L   Total Bilirubin 0.7 0.3 - 1.2 mg/dL   GFR calc non Af Amer 58 (L) >90 mL/min   GFR calc Af Amer 67 (L) >90 mL/min    Comment: (NOTE) The eGFR has been  calculated using the CKD EPI equation. This calculation has not been validated in all clinical situations. eGFR's persistently <90 mL/min signify possible Chronic Kidney Disease.    Anion gap 7 5 - 15  Ethanol (ETOH)     Status: None   Collection Time: 08/18/14  2:14 PM  Result Value Ref Range   Alcohol, Ethyl (B) <5 0 - 9 mg/dL    Comment:        LOWEST DETECTABLE LIMIT FOR SERUM ALCOHOL IS 11 mg/dL FOR MEDICAL PURPOSES ONLY   Salicylate level     Status: None   Collection Time: 08/18/14  2:14 PM  Result Value Ref Range   Salicylate Lvl <6.2 2.8 - 20.0 mg/dL  Urinalysis, Routine w reflex microscopic     Status: None   Collection Time: 08/19/14  5:45 PM  Result Value Ref Range   Color, Urine YELLOW YELLOW   APPearance CLEAR CLEAR   Specific Gravity, Urine 1.022 1.005 - 1.030   pH 5.5 5.0 - 8.0   Glucose, UA NEGATIVE NEGATIVE mg/dL   Hgb urine dipstick NEGATIVE NEGATIVE   Bilirubin Urine NEGATIVE NEGATIVE   Ketones, ur NEGATIVE NEGATIVE mg/dL   Protein, ur NEGATIVE NEGATIVE mg/dL   Urobilinogen, UA 1.0 0.0 - 1.0 mg/dL   Nitrite NEGATIVE NEGATIVE   Leukocytes, UA NEGATIVE NEGATIVE    Comment: MICROSCOPIC NOT DONE ON URINES WITH  NEGATIVE PROTEIN, BLOOD, LEUKOCYTES, NITRITE, OR GLUCOSE <1000 mg/dL. Performed at [facility]   Urine rapid drug screen (hosp performed)     Status: None   Collection Time: 08/19/14  5:45 PM  Result Value Ref Range   Opiates NONE DETECTED NONE DETECTED   Cocaine NONE DETECTED NONE DETECTED   Benzodiazepines NONE DETECTED NONE DETECTED   Amphetamines NONE DETECTED NONE DETECTED   Tetrahydrocannabinol NONE DETECTED NONE DETECTED   Barbiturates NONE DETECTED NONE DETECTED    Comment:        DRUG SCREEN FOR MEDICAL PURPOSES ONLY.  IF CONFIRMATION IS NEEDED FOR ANY PURPOSE, NOTIFY LAB WITHIN 5 DAYS.        LOWEST DETECTABLE LIMITS FOR URINE DRUG SCREEN Drug Class       Cutoff (ng/mL) Amphetamine      1000 Barbiturate      200 Benzodiazepine   229 Tricyclics       798 Opiates          300 Cocaine          300 THC              50 Performed at [facility]   TSH     Status: Abnormal   Collection Time: 08/21/14  7:05 AM  Result Value Ref Range   TSH 0.321 (L) 0.350 - 4.500 uIU/mL    Comment: Performed at [facility]    Past Psychiatric History/Hospitalization(s): Patient was admitted in December 2015 due to suicidal thoughts .  He has one more psychiatric inpatient treatment in 2006 due to suicidal attempt. He injected Ethyline Glycol. 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  He suffered significant loss including his property and he was feeling hopeless helpless and having paranoid thoughts.  In the past he had tried Abilify and Lexapro.  However he had a good response with Risperdal.     Anxiety: No Bipolar Disorder: No Depression: Yes Mania: No Psychosis: Yes Schizophrenia: No Personality Disorder: No Hospitalization for psychiatric illness: Yes History of Electroconvulsive Shock Therapy: No  Prior Suicide Attempts: Yes  Physical Exam: Constitutional:  BP 103/64 mmHg  Pulse 84  Ht 5' 11.26" (1.81  m)  Wt 200 lb 3.2 oz (90.81 kg)  BMI 27.72 kg/m2  General Appearance: alert, oriented, no acute distress and obese  ROS Musculoskeletal: Strength & Muscle Tone: within normal limits Gait & Station: unsteady Patient leans: N/A  Mental status examination : Patient is casually dressed and fairly groomed.  He appears tired.  He described his mood euthymic and his affect is constricted.  His speech is slow but coherent.  His attention and concentration is fair.  He denies any active or passive suicidal thoughts or homicidal thought.  He has some paranoia but there were no delusions or any obsessive thoughts.  He denies any auditory or visual hallucination.  His psychomotor activity is slow.  He has mild tremors in his hand.  He is alert and oriented 3.  His cognition is intact.  His insight judgment and impulse control is okay.  Established Problem, Stable/Improving (1), Review of Psycho-Social Stressors (1), Review or order clinical lab tests (1), Review and summation of old records (2), Review of Last Therapy Session (1), Review of Medication Regimen & Side Effects (2) and Review of New Medication or Change in Dosage (2)  Assessment: Axis I: Bipolar disorder type I   Axis II: Deferred  Axis III:  Patient Active Problem List   Diagnosis Date Noted  . MDD (major depressive disorder), recurrent severe, without psychosis 08/20/2014  . 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  . Severe depression   . Major depressive disorder, recurrent, severe without psychotic features   . AAA (abdominal aortic aneurysm) 07/07/2014  . AKI (acute kidney injury) 03/10/2014  . Dehydration 03/10/2014  . FTT (failure to thrive) in adult 03/10/2014  . 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 Sinus bradycardia 03/10/2014  . HTN (hypertension) 03/10/2014  . Hypotension, postural 03/09/2014  . CRD (chronic renal disease), stage III 01/09/2014  . Abdominal aortic aneurysm 12/20/2011  . Bipolar 1 disorder 10/24/2011   . Hyperlipemia 10/10/2011    Plan: I review the records from his last psychiatric admission and intensive outpatient program.  He is no longer taking Lexapro .  He is feeling better with Zoloft 100 mg .  He is taking amantadine 100 mg twice a day which is helping his tremors and shakes.  He is also taking Risperdal 0.5 mg at bedtime which is helping his paranoia and hallucination.  Recommended to continue his current psychotropic medication.  He is scheduled to see Joaquim Lai on March 28.  Discussed medication side effects and benefits.  Recommended to call us back if he has any question, concern if he feel worsening of the symptom.  Follow-up in 6 weeks. Time spent 25 minutes.  More than 50% of the time spent in psychoeducation, counseling and coordination of care.  Discuss safety plan that anytime having active suicidal thoughts or homicidal thoughts then patient need to call 911 or go to the local emergency room.   Aeriel Boulay T., MD 09/24/2014

## 2014-09-25 ENCOUNTER — Other Ambulatory Visit (HOSPITAL_COMMUNITY): Payer: Medicare Other

## 2014-09-26 ENCOUNTER — Other Ambulatory Visit (HOSPITAL_COMMUNITY): Payer: Medicare Other

## 2014-09-29 ENCOUNTER — Other Ambulatory Visit (HOSPITAL_COMMUNITY): Payer: Medicare Other

## 2014-09-30 ENCOUNTER — Other Ambulatory Visit (HOSPITAL_COMMUNITY): Payer: Medicare Other

## 2014-10-01 ENCOUNTER — Telehealth (HOSPITAL_COMMUNITY): Payer: Self-pay | Admitting: *Deleted

## 2014-10-01 ENCOUNTER — Other Ambulatory Visit (HOSPITAL_COMMUNITY): Payer: Medicare Other

## 2014-10-01 NOTE — Telephone Encounter (Signed)
Received via fax from [facility] medication clarification on the medication Amantadine 100 mg. I spoke with Fernando Carr. Paper states they have two RX, Amantadine 100 mg once daily and Amantadine 100 mg twice daily.  Per Dr. Marguerite Carr last office note patient should take Amantadine 100 mg twice daily. Advised pharmacist needs to be twice daily.  Plan: I review the records from his last psychiatric admission and intensive outpatient program. He is no longer taking Lexapro . He is feeling better with Zoloft 100 mg . He is taking amantadine 100 mg twice a day which is helping his tremors and shakes. He is also taking Risperdal 0.5 mg at bedtime which is helping his paranoia and hallucination. Recommended to continue his current psychotropic medication. He is scheduled to see Fernando Carr on March 28. Discussed medication side effects and benefits. Recommended to call us back if he has any question, concern if he feel worsening of the symptom. Follow-up in 6 weeks. Time spent 25 minutes. More than 50% of the time spent in psychoeducation, counseling and coordination of care. Discuss safety plan that anytime having active suicidal thoughts or homicidal thoughts then patient need to call 911 or go to the local emergency room.

## 2014-10-02 ENCOUNTER — Other Ambulatory Visit (HOSPITAL_COMMUNITY): Payer: Medicare Other

## 2014-10-03 ENCOUNTER — Other Ambulatory Visit (HOSPITAL_COMMUNITY): Payer: Medicare Other

## 2014-10-06 ENCOUNTER — Other Ambulatory Visit (HOSPITAL_COMMUNITY): Payer: Medicare Other

## 2014-10-07 ENCOUNTER — Other Ambulatory Visit (HOSPITAL_COMMUNITY): Payer: Medicare Other

## 2014-10-08 ENCOUNTER — Other Ambulatory Visit (HOSPITAL_COMMUNITY): Payer: Medicare Other

## 2014-10-09 ENCOUNTER — Other Ambulatory Visit (HOSPITAL_COMMUNITY): Payer: Medicare Other

## 2014-10-10 ENCOUNTER — Other Ambulatory Visit (HOSPITAL_COMMUNITY): Payer: Medicare Other

## 2014-10-13 ENCOUNTER — Other Ambulatory Visit (HOSPITAL_COMMUNITY): Payer: Medicare Other

## 2014-10-14 ENCOUNTER — Other Ambulatory Visit (HOSPITAL_COMMUNITY): Payer: Medicare Other

## 2014-10-15 ENCOUNTER — Other Ambulatory Visit (HOSPITAL_COMMUNITY): Payer: Medicare Other

## 2014-10-16 ENCOUNTER — Other Ambulatory Visit (HOSPITAL_COMMUNITY): Payer: Medicare Other

## 2014-10-17 ENCOUNTER — Other Ambulatory Visit (HOSPITAL_COMMUNITY): Payer: Medicare Other

## 2014-10-20 ENCOUNTER — Other Ambulatory Visit (HOSPITAL_COMMUNITY): Payer: Medicare Other

## 2014-10-21 ENCOUNTER — Other Ambulatory Visit (HOSPITAL_COMMUNITY): Payer: Medicare Other

## 2014-10-22 ENCOUNTER — Other Ambulatory Visit (HOSPITAL_COMMUNITY): Payer: Medicare Other

## 2014-10-27 ENCOUNTER — Encounter (HOSPITAL_COMMUNITY): Payer: Self-pay | Admitting: Clinical

## 2014-10-27 ENCOUNTER — Ambulatory Visit (INDEPENDENT_AMBULATORY_CARE_PROVIDER_SITE_OTHER): Payer: Medicare Other | Admitting: Clinical

## 2014-10-27 DIAGNOSIS — F3130 Bipolar disorder, current episode depressed, mild or moderate severity, unspecified: Secondary | ICD-10-CM

## 2014-10-27 NOTE — Progress Notes (Signed)
Patient:   Fernando Carr   DOB:   [birth year]  MR Number:  [id number]  Location:  [facility] [address] [id number] [place] [address] Dept: [phone]           Date of Service:   10/27/2014  Start Time:   10:02 End Time:   10:58  Provider/Observer:  Jerel Shepherd Counselor       Billing Code/Service: [phone]  Behavioral Observation: Alfredo Batty Testa  presents as a 75 y.o.-year-old Caucasian Male who appeared his stated age. his dress was Appropriate and he was Casual and his manners were Appropriate to the situation.  There were not any physical disabilities noted.  he displayed an appropriate level of cooperation and motivation.    Interactions:    Active   Attention:   within normal limits  Memory:   abnormal - had trouble remember recent event and reasoning for actions.  Speech (Volume):  normal  Speech:   normal pitch and normal volume  Thought Process:  Coherent and Circumstantial  Though Content:  WNL  Orientation:   person, place, time/date and situation  Judgment:   Fair  Planning:   Fair  Affect:    Appropriate  Mood:    Depressed  Insight:   Fair  Intelligence:   normal  Chief Complaint:     Chief Complaint  Patient presents with  . Anxiety  . Depression    Reason for Service:  Dr. Adele Schilder  Current Symptoms:  Depression and anxiety, thought of suicide without a plan  Source of Distress:              Running out of money, lot of medical issues  Marital Status/Living: Never married - has daughter 29 moved to [place] - has 2 month baby and boyfriend  Employment History: Retired from [employer] school.   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 I moved here 10 years ago to be close to her." "She just recently moved to [place]. I worry about her and miss her. She doesn't have many job skills and a young child." "I was worrying alot but I think she seems to be doing better than I thought. She was here for the weekend. I'm going to miss her a lot."  Zach lives by himself. He reports that he has few social supports and has a problem spending money on others that he does not have. He is currently very worried about some auto loan debts. He reported that he has an "okay" income but has over extended himself "again".   Natural/Informal Support:  I don't have much of one I have a friend - CV- in [place] who helps me and picks me from the hospital   Substance Use:  No concerns of substance abuse are reported.  3 DWI - stopped drinking about 10 years ago   Medical History:   Past Medical History  Diagnosis Date  . High cholesterol   . Bipolar disorder   . AAA (abdominal aortic aneurysm)   . Depression   . Renal insufficiency     chronic renal   . Suicide attempt aug. 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 AAA (abdominal aortic aneurysm)           Medication List       This list is accurate as of: 10/27/14 10:06 AM.  Always use your most recent med list.               amantadine 100 MG capsule  Commonly known as:  SYMMETREL  Take 1 capsule (100 mg total) by mouth 2 (two) times daily. For drug induced tremors     aspirin EC 81 MG tablet  Take 1 tablet (81 mg  total) by mouth daily.     fenofibrate 54 MG tablet     multivitamin with minerals tablet  Take 1 tablet by mouth daily.     polyethylene glycol powder powder  Commonly known as:  GLYCOLAX/MIRALAX     risperiDONE 0.5 MG tablet  Commonly known as:  RISPERDAL  Take 1 tablet (0.5 mg total) by mouth at bedtime. For mood stabilization     sertraline 100 MG tablet  Commonly known as:  ZOLOFT  Take 1 tablet (100 mg total) by mouth daily. For depression     simvastatin 10 MG tablet  Commonly known as:  ZOCOR  Take 1 tablet (10 mg total) by mouth daily at 6 PM.              Sexual History:   History  Sexual Activity  . Sexual Activity: No     Abuse/Trauma History: No childhood as a child or as an adult.   Psychiatric History:  Impatient -  Dec 2015 - Suicidal Ideation - upset about financial situation     Impatient -  10 years ago  = drinking anti-freeze.  My daughter came over and found me.                                                 IOP -  "By people upstairs, I am not sure why, but  I liked it gave me something to do which I need."                                                " I used to see for outpatient [facility] - I think it was helpful."    Strengths:   "I don't have any strengths."   Recovery Goals:  " To want to keep doing this nonsense."  Hobbies/Interests:               "I don't have any hobbies."   Challenges/Barriers: "I don't know."    Family Med/Psych History:  Family History  Problem Relation Age of Onset  . Alcohol abuse Mother   . Alcohol abuse Father   . Suicidality Paternal Uncle   . Suicidality Cousin   . Depression Daughter     Risk of Suicide/Violence: high - "I am not suicidal or homicidal." "I think about dying a lot but don't have a plan."   History of Suicide/Violence:  1 attempt - drinking radiator fluid.  Dec 2015 - suicidal ideation. Denies any violence towards others  Psychosis:   " I don't think so."  Diagnosis:     Bipolar I disorder, most recent episode depressed  Impression/DX:    Pawan Knechtel Lamoreaux is  a 74 y.o.-year-old Caucasian Male who presents with  Bipolar 1. He reports that when he is depressed he has the following symptoms: depressed mood, feeling worthless, feeling hopeless, recurrent thoughts of death, weight loss "I have lost weight over the last couple of years and I think I am still loosing weight.", increase desire for sleep "if I could sleep all day I probably would." , desire to isolate "at those times I would rather stay home most of the time,feeling sad most of the time, no motivation, difficulty doing daily tasks, low energy, and anxiety. He reports that he is currently very depressed "All related to money management " He reports the following symptoms when manic: spending more money, elevated mood, less need for sleep, thoughts change quickly.   Recommendation/Plan: Individual therapy 1x a week, follow safety plan as needed.

## 2014-11-03 ENCOUNTER — Encounter: Payer: Self-pay | Admitting: Vascular Surgery

## 2014-11-04 ENCOUNTER — Ambulatory Visit (INDEPENDENT_AMBULATORY_CARE_PROVIDER_SITE_OTHER): Payer: Medicare Other | Admitting: Vascular Surgery

## 2014-11-04 ENCOUNTER — Encounter: Payer: Self-pay | Admitting: Vascular Surgery

## 2014-11-04 VITALS — BP 127/59 | HR 63 | Resp 18 | Ht 71.5 in | Wt 197.2 lb

## 2014-11-04 DIAGNOSIS — Z9889 Other specified postprocedural states: Principal | ICD-10-CM

## 2014-11-04 DIAGNOSIS — Z8679 Personal history of other diseases of the circulatory system: Secondary | ICD-10-CM

## 2014-11-04 NOTE — Progress Notes (Signed)
Patient presents today for continued follow-up after his open aneurysm resection on 07/07/2014. He reports that he is returned to his usual stamina. He also reports that he is returned to his baseline weight. He denies any abdominal discomfort.  Past Medical History  Diagnosis Date  . High cholesterol   . Bipolar disorder   . AAA (abdominal aortic aneurysm)   . Depression   . Renal insufficiency     chronic renal   . Suicide attempt aug. 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 AAA (abdominal aortic aneurysm)     History  Substance Use Topics  . Smoking status: Former Smoker -- 1.00 packs/day for 40 years    Types: Cigarettes    Quit date: 03/11/2014  . Smokeless tobacco: Never Used  . Alcohol Use: No    Family History  Problem Relation Age of Onset  . Alcohol abuse Mother   . Alcohol abuse Father   . Suicidality Paternal Uncle   . Suicidality Cousin   . Depression Daughter     No Known Allergies   Current outpatient prescriptions:  .  amantadine (SYMMETREL) 100 MG capsule, Take 1 capsule (100 mg total) by mouth 2 (two) times daily. For drug induced tremors, Disp: 60 capsule, Rfl: 0 .  aspirin EC 81 MG tablet, Take 1 tablet (81 mg total) by mouth daily., Disp: 30 tablet, Rfl: 0 .  fenofibrate 54 MG tablet, , Disp: , Rfl:  .  Multiple Vitamins-Minerals (MULTIVITAMIN WITH MINERALS) tablet, Take 1 tablet by mouth daily., Disp: , Rfl:  .  risperiDONE (RISPERDAL) 0.5 MG tablet, Take 1 tablet (0.5 mg total) by mouth at bedtime. For mood stabilization, Disp: 30 tablet, Rfl: 1 .  sertraline (ZOLOFT) 100 MG tablet, Take 1 tablet (100 mg total) by mouth daily. For depression, Disp: 30 tablet, Rfl: 1 .  simvastatin (ZOCOR) 10 MG tablet, Take 1 tablet (10 mg total) by mouth daily at 6 PM., Disp: , Rfl:  .  polyethylene glycol powder (GLYCOLAX/MIRALAX) powder, , Disp: , Rfl:   Filed Vitals:   11/04/14 1241  BP: 127/59  Pulse: 63  Resp: 18  Height: 5' 11.5"  (1.816 m)  Weight: 197 lb 3.2 oz (89.449 kg)    Body mass index is 27.12 kg/(m^2).     Physical exam: No acute distress alert and oriented Neurologically he is grossly intact 2+ radial 2+ femoral pulses bilaterally Abdomen soft nontender. No mass or aneurysm palpable. Well-healed midline incision with no evidence of incisional hernia.   Impression and plan: Stable follow-up after open aneurysm resection. I will continue his usual activities with no limitation. We'll see him again in 9 months for final follow-up of lower extremity ankle arm indices. Assuming everything is to return to his baseline will be discharged with follow-up at that time

## 2014-11-05 ENCOUNTER — Encounter (HOSPITAL_COMMUNITY): Payer: Self-pay | Admitting: Psychiatry

## 2014-11-05 ENCOUNTER — Ambulatory Visit (INDEPENDENT_AMBULATORY_CARE_PROVIDER_SITE_OTHER): Payer: Medicare Other | Admitting: Psychiatry

## 2014-11-05 VITALS — BP 121/62 | HR 65 | Ht 72.0 in | Wt 197.0 lb

## 2014-11-05 DIAGNOSIS — F319 Bipolar disorder, unspecified: Secondary | ICD-10-CM | POA: Diagnosis not present

## 2014-11-05 MED ORDER — RISPERIDONE 0.5 MG PO TABS: 0.5000 mg | ORAL_TABLET | Freq: Every day | ORAL | Status: DC

## 2014-11-05 MED ORDER — SERTRALINE HCL 100 MG PO TABS: 100.0000 mg | ORAL_TABLET | Freq: Every day | ORAL | Status: DC

## 2014-11-05 MED ORDER — AMANTADINE HCL 100 MG PO CAPS: 100.0000 mg | ORAL_CAPSULE | Freq: Every day | ORAL | Status: DC

## 2014-11-05 NOTE — Addendum Note (Signed)
Addended by: Mena Goes on: 11/05/2014 11:09 AM   Modules accepted: Orders

## 2014-11-05 NOTE — Progress Notes (Signed)
[facility] [id number] Progress Note  Fernando Carr [id number] 74 y.o.  11/05/2014 2:50 PM  Chief Complaint:   Medication management and follow-up.  History of Present Illness: Fernando Carr came for his followup appointment.   He is taking his medication as prescribed and he seen much improvement with Zoloft. He is taking amantadine once a day even though prescribed twice a day.  He is complaining of dry mouth when he takes twice a day.  His tremors or shakes are improved from the past.  He start seeing Thelma Barge and he like to keep appointment for counseling.  He denies any irritability, anger, mood swing.   He feels his depression and anxiety is getting better with Zoloft. He denies any paranoia or any hallucination.  He is taking his blood pressure medication which is better than before.  Patient denies drinking or using any illegal substances.  His appetite is okay.  His weight is a stable.   Suicidal Ideation: No Plan Formed: No Patient has means to carry out plan: No  Homicidal Ideation: No Plan Formed: No Patient has means to carry out plan: No  Review of Systems: Psychiatric: Agitation: No Hallucination: No Depressed Mood: No Insomnia: No Hypersomnia: No Altered Concentration: No Feels Worthless: No Grandiose Ideas: No Belief In Special Powers: No New/Increased Substance Abuse: No Compulsions: No  Neurologic: Headache: No Seizure: No Paresthesias: No  Medical History:  Patient has a history of hyperlipidemia, obesity and recently started seeing Dr. Eula Listen at [facility] physician.    Outpatient Encounter Prescriptions as of 11/05/2014  Medication Sig  . amantadine (SYMMETREL) 100 MG capsule Take 1 capsule (100 mg total) by mouth daily. For drug induced tremors  . aspirin EC 81 MG tablet Take 1 tablet (81 mg total) by mouth daily.  . fenofibrate 54 MG tablet   . Multiple Vitamins-Minerals (MULTIVITAMIN WITH MINERALS) tablet Take 1 tablet by mouth daily.  . polyethylene glycol  powder (GLYCOLAX/MIRALAX) powder   . risperiDONE (RISPERDAL) 0.5 MG tablet Take 1 tablet (0.5 mg total) by mouth at bedtime. For mood stabilization  . sertraline (ZOLOFT) 100 MG tablet Take 1 tablet (100 mg total) by mouth daily. For depression  . simvastatin (ZOCOR) 10 MG tablet Take 1 tablet (10 mg total) by mouth daily at 6 PM.  . [DISCONTINUED] amantadine (SYMMETREL) 100 MG capsule Take 1 capsule (100 mg total) by mouth 2 (two) times daily. For drug induced tremors  . [DISCONTINUED] risperiDONE (RISPERDAL) 0.5 MG tablet Take 1 tablet (0.5 mg total) by mouth at bedtime. For mood stabilization  . [DISCONTINUED] sertraline (ZOLOFT) 100 MG tablet Take 1 tablet (100 mg total) by mouth daily. For depression   Recent Results (from the past 2160 hour(s))  Acetaminophen level     Status: Abnormal   Collection Time: 08/18/14  2:14 PM  Result Value Ref Range   Acetaminophen (Tylenol), Serum <10.0 (L) 10 - 30 ug/mL    Comment:        THERAPEUTIC CONCENTRATIONS VARY SIGNIFICANTLY. A RANGE OF 10-30 ug/mL MAY BE AN EFFECTIVE CONCENTRATION FOR MANY PATIENTS. HOWEVER, SOME ARE BEST TREATED AT CONCENTRATIONS OUTSIDE THIS RANGE. ACETAMINOPHEN CONCENTRATIONS >150 ug/mL AT 4 HOURS AFTER INGESTION AND >50 ug/mL AT 12 HOURS AFTER INGESTION ARE OFTEN ASSOCIATED WITH TOXIC REACTIONS.   CBC     Status: Abnormal   Collection Time: 08/18/14  2:14 PM  Result Value Ref Range   WBC 8.5 4.0 - 10.5 K/uL   RBC 4.56 4.22 - 5.81 MIL/uL  Hemoglobin 12.9 (L) 13.0 - 17.0 g/dL   HCT 40.6 39.0 - 52.0 %   MCV 89.0 78.0 - 100.0 fL   MCH 28.3 26.0 - 34.0 pg   MCHC 31.8 30.0 - 36.0 g/dL   RDW 13.3 11.5 - 15.5 %   Platelets 185 150 - 400 K/uL  Comprehensive metabolic panel     Status: Abnormal   Collection Time: 08/18/14  2:14 PM  Result Value Ref Range   Sodium 138 135 - 145 mmol/L    Comment: Please note change in reference range.   Potassium 4.1 3.5 - 5.1 mmol/L    Comment: Please note change in reference  range.   Chloride 105 96 - 112 mEq/L   CO2 26 19 - 32 mmol/L   Glucose, Bld 94 70 - 99 mg/dL   BUN 21 6 - 23 mg/dL   Creatinine, Ser 1.20 0.50 - 1.35 mg/dL   Calcium 10.7 (H) 8.4 - 10.5 mg/dL   Total Protein 7.7 6.0 - 8.3 g/dL   Albumin 4.4 3.5 - 5.2 g/dL   AST 16 0 - 37 U/L   ALT 12 0 - 53 U/L   Alkaline Phosphatase 99 39 - 117 U/L   Total Bilirubin 0.7 0.3 - 1.2 mg/dL   GFR calc non Af Amer 58 (L) >90 mL/min   GFR calc Af Amer 67 (L) >90 mL/min    Comment: (NOTE) The eGFR has been calculated using the CKD EPI equation. This calculation has not been validated in all clinical situations. eGFR's persistently <90 mL/min signify possible Chronic Kidney Disease.    Anion gap 7 5 - 15  Ethanol (ETOH)     Status: None   Collection Time: 08/18/14  2:14 PM  Result Value Ref Range   Alcohol, Ethyl (B) <5 0 - 9 mg/dL    Comment:        LOWEST DETECTABLE LIMIT FOR SERUM ALCOHOL IS 11 mg/dL FOR MEDICAL PURPOSES ONLY   Salicylate level     Status: None   Collection Time: 08/18/14  2:14 PM  Result Value Ref Range   Salicylate Lvl <5.7 2.8 - 20.0 mg/dL  Urinalysis, Routine w reflex microscopic     Status: None   Collection Time: 08/19/14  5:45 PM  Result Value Ref Range   Color, Urine YELLOW YELLOW   APPearance CLEAR CLEAR   Specific Gravity, Urine 1.022 1.005 - 1.030   pH 5.5 5.0 - 8.0   Glucose, UA NEGATIVE NEGATIVE mg/dL   Hgb urine dipstick NEGATIVE NEGATIVE   Bilirubin Urine NEGATIVE NEGATIVE   Ketones, ur NEGATIVE NEGATIVE mg/dL   Protein, ur NEGATIVE NEGATIVE mg/dL   Urobilinogen, UA 1.0 0.0 - 1.0 mg/dL   Nitrite NEGATIVE NEGATIVE   Leukocytes, UA NEGATIVE NEGATIVE    Comment: MICROSCOPIC NOT DONE ON URINES WITH NEGATIVE PROTEIN, BLOOD, LEUKOCYTES, NITRITE, OR GLUCOSE <1000 mg/dL. Performed at [facility]   Urine rapid drug screen (hosp performed)     Status: None   Collection Time: 08/19/14  5:45 PM  Result Value Ref Range   Opiates NONE DETECTED  NONE DETECTED   Cocaine NONE DETECTED NONE DETECTED   Benzodiazepines NONE DETECTED NONE DETECTED   Amphetamines NONE DETECTED NONE DETECTED   Tetrahydrocannabinol NONE DETECTED NONE DETECTED   Barbiturates NONE DETECTED NONE DETECTED    Comment:        DRUG SCREEN FOR MEDICAL PURPOSES ONLY.  IF CONFIRMATION IS NEEDED FOR ANY PURPOSE, NOTIFY LAB WITHIN 5 DAYS.  LOWEST DETECTABLE LIMITS FOR URINE DRUG SCREEN Drug Class       Cutoff (ng/mL) Amphetamine      1000 Barbiturate      200 Benzodiazepine   762 Tricyclics       263 Opiates          300 Cocaine          300 THC              50 Performed at [facility]   TSH     Status: Abnormal   Collection Time: 08/21/14  7:05 AM  Result Value Ref Range   TSH 0.321 (L) 0.350 - 4.500 uIU/mL    Comment: Performed at [facility]    Past Psychiatric History/Hospitalization(s): Patient was admitted in December 2015 due to suicidal thoughts .  He has one more psychiatric inpatient treatment in 2006 due to suicidal attempt. He injected Ethyline Glycol. 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  He suffered significant loss including his property and he was feeling hopeless helpless and having paranoid thoughts.  In the past he had tried Abilify and Lexapro.  However he had a good response with Risperdal.     Anxiety: No Bipolar Disorder: No Depression: Yes Mania: No Psychosis: Yes Schizophrenia: No Personality Disorder: No Hospitalization for psychiatric illness: Yes History of Electroconvulsive Shock Therapy: No Prior Suicide Attempts: Yes  Physical Exam: Constitutional:  BP 121/62 mmHg  Pulse 65  Ht 6' (1.829 m)  Wt 197 lb (89.359 kg)  BMI 26.71 kg/m2  General Appearance: alert, oriented, no acute distress and obese  ROS Musculoskeletal: Strength & Muscle Tone: within normal limits Gait & Station: unsteady Patient leans: N/A  Mental status examination : Patient  is casually dressed and fairly groomed.  He appears tired.  He described his mood euthymic and his affect is constricted.  His speech is slow but coherent.  His attention and concentration is fair.  He denies any active or passive suicidal thoughts or homicidal thought.  He has some paranoia but there were no delusions or any obsessive thoughts.  He denies any auditory or visual hallucination.  His psychomotor activity is slow.   He is alert and oriented 3.  His cognition is intact.  His insight judgment and impulse control is okay.  Established Problem, Stable/Improving (1), Review of Psycho-Social Stressors (1), Review of Last Therapy Session (1) and Review of Medication Regimen & Side Effects (2)  Assessment: Axis I: Bipolar disorder type I   Axis II: Deferred  Axis III:  Patient Active Problem List   Diagnosis Date Noted  . MDD (major depressive disorder), recurrent severe, without psychosis 08/20/2014  . 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  . Severe depression   . Major depressive disorder, recurrent, severe without psychotic features   . AAA (abdominal aortic aneurysm) 07/07/2014  . AKI (acute kidney injury) 03/10/2014  . Dehydration 03/10/2014  . FTT (failure to thrive) in adult 03/10/2014  . 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 Sinus bradycardia 03/10/2014  . HTN (hypertension) 03/10/2014  . Hypotension, postural 03/09/2014  . CRD (chronic renal disease), stage III 01/09/2014  . Abdominal aortic aneurysm 12/20/2011  . Bipolar 1 disorder 10/24/2011  . Hyperlipemia 10/10/2011    Plan:  patient is doing better on his current medication. I would decrease amantadine 100 mg daily since he is not taking twice a day.  He [facility]  Continue Risperdal  0.5 mg at bedtime, Zoloft 100 mg daily and amantadine 100 mg daily.  Discussed medication side effects and benefits.  Encouraged to keep appointment with Joaquim Lai for counseling.  Recommended  to call us back if he has any question, concern or worsening of the symptoms. I will see him again in 3 months.    Issak Goley T., MD 11/05/2014

## 2014-11-07 ENCOUNTER — Ambulatory Visit (HOSPITAL_COMMUNITY): Payer: Self-pay | Admitting: Clinical

## 2014-11-07 DIAGNOSIS — Z9889 Other specified postprocedural states: Secondary | ICD-10-CM | POA: Diagnosis not present

## 2014-11-07 DIAGNOSIS — N183 Chronic kidney disease, stage 3 (moderate): Secondary | ICD-10-CM | POA: Diagnosis not present

## 2014-11-07 DIAGNOSIS — E782 Mixed hyperlipidemia: Secondary | ICD-10-CM | POA: Diagnosis not present

## 2014-11-07 DIAGNOSIS — I739 Peripheral vascular disease, unspecified: Secondary | ICD-10-CM | POA: Diagnosis not present

## 2014-11-07 DIAGNOSIS — F3170 Bipolar disorder, currently in remission, most recent episode unspecified: Secondary | ICD-10-CM | POA: Diagnosis not present

## 2014-11-17 DIAGNOSIS — R42 Dizziness and giddiness: Secondary | ICD-10-CM | POA: Diagnosis not present

## 2014-12-01 ENCOUNTER — Encounter (HOSPITAL_COMMUNITY): Payer: Self-pay | Admitting: Clinical

## 2014-12-01 ENCOUNTER — Ambulatory Visit (INDEPENDENT_AMBULATORY_CARE_PROVIDER_SITE_OTHER): Payer: Medicare Other | Admitting: Clinical

## 2014-12-01 DIAGNOSIS — F3130 Bipolar disorder, current episode depressed, mild or moderate severity, unspecified: Secondary | ICD-10-CM

## 2014-12-01 NOTE — Progress Notes (Signed)
   THERAPIST PROGRESS NOTE  Session Time: 10:02  - 11:02  Participation Level: Active  Behavioral Response: CasualAlertDepressed  Type of Therapy: Individual Therapy  Treatment Goals addressed: Improve psychiatric symptoms, stress management skills, learn about bipolar disorder, improve faulty thought patterns  Interventions: Motivational interviewing, CBT, and Grounding/Mindfulness techniques  Summary: Fernando Carr is a 74 y.o. male who presents with Bipolar I    Suicidal/Homicidal: No - without intent/plan  Therapist Response: Geordie met with clinician for an individual session. Abdurahman discussed his psychiatric symptoms and his current life events. Hason shared with that he continues to feel depressed. He shared that he is not motivated to do anything nor is he very motivated to change anything. Gene shared that he spends a lot of his time watching tv. He shared he has one friend who visits him every few weeks. Quavion shared that sitting in the house increases his depression but that he doesn't leave because "it would cause him to spend money." Finnbar and clinician discussed things he could do that would not cost money. Clinician introduced grounding and mindfulness techniques and client and clinician practiced some together. Clinician introduced Laakea to some cbt concepts. Jousha agreed to do a packet with some cbt homework on bipolar disorder.  Plan: Return again in 2 weeks.  Diagnosis:     Axis I: Bipolar 1 disorder, mixed, moderate    Demetrica Zipp A, LCSW 12/01/2014

## 2014-12-25 ENCOUNTER — Ambulatory Visit (HOSPITAL_COMMUNITY): Payer: Self-pay | Admitting: Clinical

## 2015-01-08 ENCOUNTER — Ambulatory Visit (INDEPENDENT_AMBULATORY_CARE_PROVIDER_SITE_OTHER): Payer: Medicare Other | Admitting: Clinical

## 2015-01-08 ENCOUNTER — Encounter (HOSPITAL_COMMUNITY): Payer: Self-pay | Admitting: Clinical

## 2015-01-08 DIAGNOSIS — F319 Bipolar disorder, unspecified: Secondary | ICD-10-CM

## 2015-01-08 NOTE — Progress Notes (Signed)
   THERAPIST PROGRESS NOTE  Session Time: 10:15 - 11: 00  Participation Level: Minimal  Behavioral Response: CasualLethargicDepressed  Type of Therapy: Individual Therapy  Treatment Goals addressed: Improve psychiatric symptoms  Interventions: Motivational interviewing  Summary: Fernando Carr is a 74 y.o. male who presents with Bipolar I    Suicidal/Homicidal: No - without intent/plan  Therapist Response: Fernando Carr met with clinician for an individual session. Fernando Carr discussed his psychiatric symptoms, his current life events, and his homework. He shared that he had completed his packet on bipolar (cbt homework) He shared it was interesting but he did not bring it with him. Clinician asked open ended questions about Fernando Carr's quality of life.Fernando Carr shared that he spends most of his day watching tv and is unmotivated to do much else. Fernando Carr shared that he is "okay' with the way things are. He shared that he didn't think he wanted anything else. Clinician sked about times when his life was better. Fernando Carr shared that he couldn't remember and he was content with the way his life currently is. Fernando Carr stated that he wasn't really interested in working towards something else. Fernando Carr shared that he thought he would not come to therapy for a while. Clinician let Fernando Carr know that he could return if he was interested. He would just need to make an appointment with the front desk. Fernando Carr denied any current suicidal or homicidal ideation.   Plan: Return when ready  Diagnosis:     Axis I: Bipolar 1 disorder, mixed, moderate    , A, LCSW 01/08/2015  

## 2015-01-12 ENCOUNTER — Encounter (HOSPITAL_COMMUNITY): Payer: Self-pay | Admitting: Clinical

## 2015-01-15 ENCOUNTER — Other Ambulatory Visit (HOSPITAL_COMMUNITY): Payer: Self-pay | Admitting: Psychiatry

## 2015-01-19 ENCOUNTER — Other Ambulatory Visit (HOSPITAL_COMMUNITY): Payer: Self-pay | Admitting: Psychiatry

## 2015-01-26 ENCOUNTER — Other Ambulatory Visit: Payer: Self-pay

## 2015-02-04 ENCOUNTER — Ambulatory Visit (HOSPITAL_COMMUNITY): Payer: Self-pay | Admitting: Psychiatry

## 2015-02-09 DIAGNOSIS — Z0000 Encounter for general adult medical examination without abnormal findings: Secondary | ICD-10-CM | POA: Diagnosis not present

## 2015-02-09 DIAGNOSIS — R7309 Other abnormal glucose: Secondary | ICD-10-CM | POA: Diagnosis not present

## 2015-02-09 DIAGNOSIS — N183 Chronic kidney disease, stage 3 (moderate): Secondary | ICD-10-CM | POA: Diagnosis not present

## 2015-02-09 DIAGNOSIS — F3170 Bipolar disorder, currently in remission, most recent episode unspecified: Secondary | ICD-10-CM | POA: Diagnosis not present

## 2015-02-09 DIAGNOSIS — Z1389 Encounter for screening for other disorder: Secondary | ICD-10-CM | POA: Diagnosis not present

## 2015-02-09 DIAGNOSIS — E782 Mixed hyperlipidemia: Secondary | ICD-10-CM | POA: Diagnosis not present

## 2015-02-09 DIAGNOSIS — I714 Abdominal aortic aneurysm, without rupture: Secondary | ICD-10-CM | POA: Diagnosis not present

## 2015-02-09 DIAGNOSIS — I739 Peripheral vascular disease, unspecified: Secondary | ICD-10-CM | POA: Diagnosis not present

## 2015-02-09 DIAGNOSIS — Z125 Encounter for screening for malignant neoplasm of prostate: Secondary | ICD-10-CM | POA: Diagnosis not present

## 2015-02-25 DIAGNOSIS — H524 Presbyopia: Secondary | ICD-10-CM | POA: Diagnosis not present

## 2015-02-25 DIAGNOSIS — H25013 Cortical age-related cataract, bilateral: Secondary | ICD-10-CM | POA: Diagnosis not present

## 2015-02-25 DIAGNOSIS — H35371 Puckering of macula, right eye: Secondary | ICD-10-CM | POA: Diagnosis not present

## 2015-02-25 DIAGNOSIS — H2513 Age-related nuclear cataract, bilateral: Secondary | ICD-10-CM | POA: Diagnosis not present

## 2015-02-25 DIAGNOSIS — H02833 Dermatochalasis of right eye, unspecified eyelid: Secondary | ICD-10-CM | POA: Diagnosis not present

## 2015-03-09 ENCOUNTER — Ambulatory Visit (INDEPENDENT_AMBULATORY_CARE_PROVIDER_SITE_OTHER): Payer: Medicare Other | Admitting: Psychiatry

## 2015-03-09 ENCOUNTER — Encounter (HOSPITAL_COMMUNITY): Payer: Self-pay | Admitting: Psychiatry

## 2015-03-09 ENCOUNTER — Telehealth (HOSPITAL_COMMUNITY): Payer: Self-pay | Admitting: *Deleted

## 2015-03-09 VITALS — BP 116/60 | HR 72 | Ht 72.0 in | Wt 196.6 lb

## 2015-03-09 DIAGNOSIS — F319 Bipolar disorder, unspecified: Secondary | ICD-10-CM

## 2015-03-09 MED ORDER — SERTRALINE HCL 100 MG PO TABS: ORAL_TABLET | ORAL | Status: DC

## 2015-03-09 MED ORDER — AMANTADINE HCL 100 MG PO CAPS: ORAL_CAPSULE | ORAL | Status: DC

## 2015-03-09 MED ORDER — RISPERIDONE 0.5 MG PO TABS: ORAL_TABLET | ORAL | Status: DC

## 2015-03-09 NOTE — Progress Notes (Signed)
[facility] Progress Note  Fernando Carr [id number] 74 y.o.  03/09/2015 4:43 PM  Chief Complaint:  Medication management and follow-up.  History of Present Illness: Fernando Carr came for his followup appointment.   He has been under a lot of stress because his 39 year old daughter moved from [place].  Patient told his daughter bring his 27-month-old and 35-year-old child with her.  Patient told it looks like she is not getting along with her boyfriend.  Patient is concerned about finances .  He has not discuss more in detail with his daughter about the future plans .  However he is going to discuss if his doctor is stays in [place].  Patient is not seeing therapist because he does not believe he was able to get some help.  Despite he has psychosocial issues he does not believe counseling is going to help him.  He admitted some financial strain .  He like to continue his current medication.  He is taking Zoloft, Risperdal and amantadine.  His tremors are less intense and less frequent.  He sleeping good.  He denies any hallucination or any paranoia.  He denies any irritability or any agitation.  Patient denies drinking or using any illegal substances.  He denies any recent mania or any psychosis.  His appetite is okay.  His vitals are stable.  Suicidal Ideation: No Plan Formed: No Patient has means to carry out plan: No  Homicidal Ideation: No Plan Formed: No Patient has means to carry out plan: No  Review of Systems: Psychiatric: Agitation: No Hallucination: No Depressed Mood: No Insomnia: No Hypersomnia: No Altered Concentration: No Feels Worthless: No Grandiose Ideas: No Belief In Special Powers: No New/Increased Substance Abuse: No Compulsions: No  Neurologic: Headache: No Seizure: No Paresthesias: No  Medical History:  Patient has a history of hyperlipidemia, obesity and recently started seeing Dr. Deforest Carr at [facility] physician.    Outpatient Encounter  Prescriptions as of 03/09/2015  Medication Sig  . amantadine (SYMMETREL) 100 MG capsule TAKE 1 CAPSULE DAILY FOR DRUG INDUCED TREMORS  . aspirin EC 81 MG tablet Take 1 tablet (81 mg total) by mouth daily.  . fenofibrate 54 MG tablet   . Multiple Vitamins-Minerals (MULTIVITAMIN WITH MINERALS) tablet Take 1 tablet by mouth daily.  . polyethylene glycol powder (GLYCOLAX/MIRALAX) powder   . risperiDONE (RISPERDAL) 0.5 MG tablet TAKE 1 TABLET AT BEDTIME FOR MOOD STABILIZATION  . sertraline (ZOLOFT) 100 MG tablet TAKE 1 TABLET DAILY FOR DEPRESSION  . simvastatin (ZOCOR) 10 MG tablet Take 1 tablet (10 mg total) by mouth daily at 6 PM.  . [DISCONTINUED] amantadine (SYMMETREL) 100 MG capsule TAKE 1 CAPSULE DAILY FOR DRUG INDUCED TREMORS  . [DISCONTINUED] risperiDONE (RISPERDAL) 0.5 MG tablet TAKE 1 TABLET AT BEDTIME FOR MOOD STABILIZATION  . [DISCONTINUED] sertraline (ZOLOFT) 100 MG tablet TAKE 1 TABLET DAILY FOR DEPRESSION   No facility-administered encounter medications on file as of 03/09/2015.   No results found for this or any previous visit (from the past 2160 hour(s)).  Past Psychiatric History/Hospitalization(s): Patient was admitted in December 2015 due to suicidal thoughts .  He has one more psychiatric inpatient treatment in 2006 due to suicidal attempt. He injected Ethyline Glycol. 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  He suffered significant loss including his property and he was feeling hopeless helpless and having paranoid thoughts.  In the past he had tried Abilify and Lexapro.  However he had a good response with Risperdal.  Anxiety: No Bipolar Disorder: No Depression: Yes Mania: No Psychosis: Yes Schizophrenia: No Personality Disorder: No Hospitalization for psychiatric illness: Yes History of Electroconvulsive Shock Therapy: No Prior Suicide Attempts: Yes  Physical Exam: Constitutional:  BP 116/60 mmHg  Pulse 72  Ht 6' (1.829 m)  Wt 196 lb  9.6 oz (89.177 kg)  BMI 26.66 kg/m2  General Appearance: alert, oriented, no acute distress and obese  ROS Musculoskeletal: Strength & Muscle Tone: within normal limits Gait & Station: unsteady Patient leans: N/A  Mental status examination : Patient is casually dressed and fairly groomed.  He appears tired.  He described his mood euthymic and his affect is constricted.  His speech is slow but coherent.  His attention and concentration is fair.  He denies any active or passive suicidal thoughts or homicidal thought.  He has some paranoia but there were no delusions or any obsessive thoughts.  He denies any auditory or visual hallucination.  His psychomotor activity is slow.   He is alert and oriented 3.  His cognition is intact.  His insight judgment and impulse control is okay.  Established Problem, Stable/Improving (1), Review of Psycho-Social Stressors (1), Review of Last Therapy Session (1) and Review of Medication Regimen & Side Effects (2)  Assessment: Axis I: Bipolar disorder type I   Axis II: Deferred  Axis III:  Patient Active Problem List   Diagnosis Date Noted  . MDD (major depressive disorder), recurrent severe, without psychosis 08/20/2014  . 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  . Severe depression   . Major depressive disorder, recurrent, severe without psychotic features   . AAA (abdominal aortic aneurysm) 07/07/2014  . AKI (acute kidney injury) 03/10/2014  . Dehydration 03/10/2014  . FTT (failure to thrive) in adult 03/10/2014  . 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 Sinus bradycardia 03/10/2014  . HTN (hypertension) 03/10/2014  . Hypotension, postural 03/09/2014  . CRD (chronic renal disease), stage III 01/09/2014  . Abdominal aortic aneurysm 12/20/2011  . Bipolar 1 disorder 10/24/2011  . Hyperlipemia 10/10/2011    Plan: Discuss psychosocial issues and recommended counseling but patient declined.  He wants to continue current psych of medication.  He has no  side effects.  I will continue amantadine 100 mg daily, Risperdal 0.5 mg at bedtime and  Zoloft 100 mg daily.  Recommended to call us back if he has any question or any concern.  Discuss safety plan that anytime having active suicidal thoughts or homicidal thoughts then he need to call 911 or go to the local emergency room.  Follow-up in 3 months.   Eveline Sauve T., MD 03/09/2015

## 2015-03-09 NOTE — Telephone Encounter (Signed)
Called pt immediately after discussing his slightly irregular heart rhythm with clinic RN (detected at vitals signs check during office visit today with Dr. Adele Schilder). Per RN instructions, instructed pt to make an appointment with his PCP or cardiologist to follow up on his irregular heart rate, as the pt has a history of aneurisms and other cardiovascular issues. Pt said he would. Encouraged pt to report to nearest ED or Urgent Care if he experiences any cardiovascular symptoms such as chest pain, dizziness, light-headedness, headaches, internal tearing sensations, or anything else out of the ordinary. Pt said he would.

## 2015-06-09 ENCOUNTER — Encounter (HOSPITAL_COMMUNITY): Payer: Self-pay | Admitting: Psychiatry

## 2015-06-09 ENCOUNTER — Ambulatory Visit (INDEPENDENT_AMBULATORY_CARE_PROVIDER_SITE_OTHER): Payer: Medicare Other | Admitting: Psychiatry

## 2015-06-09 VITALS — BP 126/73 | HR 101 | Ht 72.0 in | Wt 205.6 lb

## 2015-06-09 DIAGNOSIS — F319 Bipolar disorder, unspecified: Secondary | ICD-10-CM | POA: Diagnosis not present

## 2015-06-09 MED ORDER — AMANTADINE HCL 100 MG PO CAPS: ORAL_CAPSULE | ORAL | Status: DC

## 2015-06-09 MED ORDER — RISPERIDONE 0.5 MG PO TABS: ORAL_TABLET | ORAL | Status: DC

## 2015-06-09 MED ORDER — SERTRALINE HCL 100 MG PO TABS: ORAL_TABLET | ORAL | Status: DC

## 2015-06-09 NOTE — Progress Notes (Signed)
[facility] Progress Note  Fernando Carr [id number] 74 y.o.  06/09/2015 10:19 AM  Chief Complaint:  Medication management and follow-up.  History of Present Illness: Fernando Carr came for his followup appointment.   He is taking his medication as prescribed.  Now he is enjoying the company off his daughter and his grandson.  He is relieved that his daughter is not asking money from him .  He described his mood is stable.  He sleeping good.  He denies any paranoia, hallucination or any irritability.  He takes his medication and reported no side effects.  He endorse that things are going very well with his daughter and his grandbaby.  His grandson is now 20-month-old.  His doctor started school and he is hoping that she should able to finish the school soon.  Patient is sleeping good.  He denies any drinking or using any illegal substances.  His appetite is okay.  His energy level is fair.  His vitals are okay.  He denies any recent mania or any psychosis.  Suicidal Ideation: No Plan Formed: No Patient has means to carry out plan: No  Homicidal Ideation: No Plan Formed: No Patient has means to carry out plan: No  Review of Systems: Psychiatric: Agitation: No Hallucination: No Depressed Mood: No Insomnia: No Hypersomnia: No Altered Concentration: No Feels Worthless: No Grandiose Ideas: No Belief In Special Powers: No New/Increased Substance Abuse: No Compulsions: No  Neurologic: Headache: No Seizure: No Paresthesias: No  Medical History:  Patient has a history of hyperlipidemia, obesity and recently started seeing Dr. Deforest Hoyles at [facility] physician.    Outpatient Encounter Prescriptions as of 06/09/2015  Medication Sig  . amantadine (SYMMETREL) 100 MG capsule TAKE 1 CAPSULE DAILY FOR DRUG INDUCED TREMORS  . aspirin EC 81 MG tablet Take 1 tablet (81 mg total) by mouth daily.  . fenofibrate 54 MG tablet   . Multiple Vitamins-Minerals (MULTIVITAMIN WITH MINERALS) tablet  Take 1 tablet by mouth daily.  . polyethylene glycol powder (GLYCOLAX/MIRALAX) powder   . risperiDONE (RISPERDAL) 0.5 MG tablet TAKE 1 TABLET AT BEDTIME FOR MOOD STABILIZATION  . sertraline (ZOLOFT) 100 MG tablet TAKE 1 TABLET DAILY FOR DEPRESSION  . simvastatin (ZOCOR) 10 MG tablet Take 1 tablet (10 mg total) by mouth daily at 6 PM.  . [DISCONTINUED] amantadine (SYMMETREL) 100 MG capsule TAKE 1 CAPSULE DAILY FOR DRUG INDUCED TREMORS  . [DISCONTINUED] risperiDONE (RISPERDAL) 0.5 MG tablet TAKE 1 TABLET AT BEDTIME FOR MOOD STABILIZATION  . [DISCONTINUED] sertraline (ZOLOFT) 100 MG tablet TAKE 1 TABLET DAILY FOR DEPRESSION   No facility-administered encounter medications on file as of 06/09/2015.   No results found for this or any previous visit (from the past 2160 hour(s)).  Past Psychiatric History/Hospitalization(s): Patient was admitted in December 2015 due to suicidal thoughts .  He has one more psychiatric inpatient treatment in 2006 due to suicidal attempt. He injected Ethyline Glycol. 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  He suffered significant loss including his property and he was feeling hopeless helpless and having paranoid thoughts.  In the past he had tried Abilify and Lexapro.  However he had a good response with Risperdal.     Anxiety: No Bipolar Disorder: No Depression: Yes Mania: No Psychosis: Yes Schizophrenia: No Personality Disorder: No Hospitalization for psychiatric illness: Yes History of Electroconvulsive Shock Therapy: No Prior Suicide Attempts: Yes  Physical Exam: Constitutional:  BP 126/73 mmHg  Pulse 101  Ht 6' (1.829 m)  Wt 205 lb 9.6 oz (93.26 kg)  BMI 27.88 kg/m2  General Appearance: alert, oriented, no acute distress and obese  Review of Systems  Skin: Negative for itching and rash.  Neurological: Negative for dizziness and tremors.  Psychiatric/Behavioral: Negative for suicidal ideas, hallucinations and substance  abuse.   Musculoskeletal: Strength & Muscle Tone: within normal limits Gait & Station: unsteady Patient leans: N/A  Mental status examination : Patient is casually dressed and fairly groomed.  He is cooperative and maintained good eye contact. He described his mood euthymic and his affect is appropriate.  His speech is slow but coherent.  His attention and concentration is fair.  He denies any active or passive suicidal thoughts or homicidal thought.  He denies any paranoia , delusions or any obsessive thoughts.  He denies any auditory or visual hallucination.  His psychomotor activity is slow.   He is alert and oriented 3.  His cognition is intact.  His insight judgment and impulse control is okay.  Established Problem, Stable/Improving (1), Review of Psycho-Social Stressors (1), Review of Last Therapy Session (1) and Review of Medication Regimen & Side Effects (2)  Assessment: Axis I: Bipolar disorder type I   Axis II: Deferred  Axis III:  Patient Active Problem List   Diagnosis Date Noted  . MDD (major depressive disorder), recurrent severe, without psychosis ([place]) 08/20/2014  . 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  . Severe depression   . Major depressive disorder, recurrent, severe without psychotic features ([place])   . AAA (abdominal aortic aneurysm) ([place]) 07/07/2014  . AKI (acute kidney injury) ([place]) 03/10/2014  . Dehydration 03/10/2014  . FTT (failure to thrive) in adult 03/10/2014  . Aneurysm of right iliac artery ([place]) 03/10/2014  . Sinus bradycardia 03/10/2014  . HTN (hypertension) 03/10/2014  . Hypotension, postural 03/09/2014  . CRD (chronic renal disease), stage III 01/09/2014  . Abdominal aortic aneurysm ([place]) 12/20/2011  . Bipolar 1 disorder ([place]) 10/24/2011  . Hyperlipemia 10/10/2011    Plan: Patient is a stable on his current psychiatric medication.  He likes to continue his medication.  He has no side effects.  I will continue amantadine 100 mg daily, Risperdal 0.5 mg  at bedtime and  Zoloft 100 mg daily.  Recommended to call us back if he has any question or any concern.  Patient is not interested in counseling.  Discuss safety plan that anytime having active suicidal thoughts or homicidal thoughts then he need to call 911 or go to the local emergency room.  Follow-up in 3 months.   Katryn Plummer T., MD 06/09/2015

## 2015-08-07 ENCOUNTER — Encounter: Payer: Self-pay | Admitting: Family

## 2015-08-11 ENCOUNTER — Ambulatory Visit (HOSPITAL_COMMUNITY)
Admission: RE | Admit: 2015-08-11 | Discharge: 2015-08-11 | Disposition: A | Discharge status: Home / Self Care | Payer: Medicare Other | Source: Ambulatory Visit | Attending: Vascular Surgery | Admitting: Vascular Surgery

## 2015-08-11 ENCOUNTER — Ambulatory Visit (INDEPENDENT_AMBULATORY_CARE_PROVIDER_SITE_OTHER): Payer: Medicare Other | Admitting: Family

## 2015-08-11 ENCOUNTER — Encounter: Payer: Self-pay | Admitting: Family

## 2015-08-11 VITALS — BP 131/79 | HR 83 | Temp 97.0°F | Resp 16 | Ht 72.0 in | Wt 215.0 lb

## 2015-08-11 DIAGNOSIS — Z8679 Personal history of other diseases of the circulatory system: Secondary | ICD-10-CM | POA: Diagnosis not present

## 2015-08-11 DIAGNOSIS — Z9889 Other specified postprocedural states: Secondary | ICD-10-CM

## 2015-08-11 DIAGNOSIS — Z4889 Encounter for other specified surgical aftercare: Secondary | ICD-10-CM

## 2015-08-11 DIAGNOSIS — I714 Abdominal aortic aneurysm, without rupture, unspecified: Secondary | ICD-10-CM

## 2015-08-11 DIAGNOSIS — Z48812 Encounter for surgical aftercare following surgery on the circulatory system: Secondary | ICD-10-CM

## 2015-08-11 DIAGNOSIS — R938 Abnormal findings on diagnostic imaging of other specified body structures: Secondary | ICD-10-CM | POA: Diagnosis not present

## 2015-08-11 DIAGNOSIS — E7800 Pure hypercholesterolemia, unspecified: Secondary | ICD-10-CM | POA: Diagnosis not present

## 2015-08-11 NOTE — Progress Notes (Signed)
Filed Vitals:   08/11/15 1110 08/11/15 1120  BP: 144/74 131/79  Pulse: 60 83  Temp: 97 F (36.1 C)   TempSrc: Oral   Resp: 16   Height: 6' (1.829 m)   Weight: 215 lb (97.523 kg)   SpO2: 94%

## 2015-08-11 NOTE — Progress Notes (Signed)
[facility]  Established Open AAA Repair  History of Present Illness  Fernando Carr is a 75 y.o. (01/10/1941) male patient of Dr. Donnetta Hutching who is s/p open aneurysm resection on 07/07/2014. He reports that he is returned to his usual stamina. He also reports that he is returned to his baseline weight. He denies any abdominal discomfort  The patient does not have back or abdominal pain.  He no longer walks at least 60 minutes daily as he was, denies any barriers to walking, states he is "lazy". He denies any cardiac problems, denies chest pain or dyspnea. He denies claudication sx's with walking.   Patient denies ever having a stroke or TIA symptoms. His psychiatrist is Dr. Adele Schilder, [facility], manages patient's bipolar disorder and depression.  He does not take daily 81 mg ASA, states he just stopped taking.   Pt Diabetic: No Pt smoker: smoker (quit December 2015, 1 ppd x 45+ yrs)  Past Medical History  Diagnosis Date  . High cholesterol   . Bipolar disorder ([place])   . AAA (abdominal aortic aneurysm) ([place])   . Depression   . Renal insufficiency     chronic renal   . Suicide attempt ([place]) aug. 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 AAA (abdominal aortic aneurysm) [facility])     Past Surgical History  Procedure Laterality Date  . Knee arthroscopy Left 1972  . Abdominal aortic aneurysm repair N/A 07/07/2014    Procedure: ABDOMINAL AORTIC ANEURYSM REPAIR;  Surgeon: Rosetta Posner, MD;  Location: [facility] OR;  Service: Vascular;  Laterality: N/A;  . Abdominal aortic aneurysm repair  07-07-14    Dr. Donnetta Hutching   Social History Social History   Social History  . Marital Status: Single    Spouse Name: N/A  . Number of Children: N/A  . Years of Education: N/A   Occupational History  . Not on file.   Social History Main Topics  . Smoking status: Former Smoker -- 1.00 packs/day for 40 years    Types: Cigarettes    Quit date:  03/11/2014  . Smokeless tobacco: Never Used  . Alcohol Use: No  . Drug Use: No  . Sexual Activity: No   Other Topics Concern  . Not on file   Social History Narrative   Family History Family History  Problem Relation Age of Onset  . Alcohol abuse Mother   . Alcohol abuse Father   . Suicidality Paternal Uncle   . Suicidality Cousin   . Depression Daughter    Current Outpatient Prescriptions on File Prior to Visit  Medication Sig Dispense Refill  . amantadine (SYMMETREL) 100 MG capsule TAKE 1 CAPSULE DAILY FOR DRUG INDUCED TREMORS 90 capsule 0  . risperiDONE (RISPERDAL) 0.5 MG tablet TAKE 1 TABLET AT BEDTIME FOR MOOD STABILIZATION 90 tablet 0  . sertraline (ZOLOFT) 100 MG tablet TAKE 1 TABLET DAILY FOR DEPRESSION 90 tablet 0  . aspirin EC 81 MG tablet Take 1 tablet (81 mg total) by mouth daily. (Patient not taking: Reported on 08/11/2015) 30 tablet 0  . fenofibrate 54 MG tablet Reported on 08/11/2015    . Multiple Vitamins-Minerals (MULTIVITAMIN WITH MINERALS) tablet Take 1 tablet by mouth daily. Reported on 08/11/2015    . polyethylene glycol powder (GLYCOLAX/MIRALAX) powder Reported on 08/11/2015    . simvastatin (ZOCOR) 10 MG tablet Take 1 tablet (10 mg total) by mouth daily at 6 PM. (Patient not taking: Reported on  08/11/2015)     No current facility-administered medications on file prior to visit.   No Known Allergies  ROS: See HPI for pertinent positives and negatives.    Physical Examination  Filed Vitals:   08/11/15 1110 08/11/15 1120  BP: 144/74 131/79  Pulse: 60 83  Temp: 97 F (36.1 C)   TempSrc: Oral   Resp: 16   Height: 6' (1.829 m)   Weight: 215 lb (97.523 kg)   SpO2: 94%    Body mass index is 29.15 kg/(m^2).  General: A&O x 3, WD, male.  Pulmonary: Sym exp, good air movt, CTAB, no rales, rhonchi, or wheezing.  Cardiac: RRR, Nl S1, S2, no detected murmur  Carotid Bruits Left Right   Negative Negative   Aorta is not palpable. Radial pulses  are 2+ bilaterally.   VASCULAR EXAM:     LE Pulses LEFT RIGHT   FEMORAL  palpable  palpable    POPLITEAL not palpable  not palpable   POSTERIOR TIBIAL not palpable  not palpable    DORSALIS PEDIS  ANTERIOR TIBIAL palpable  palpable     Gastrointestinal: soft, NTND, -G/R, - HSM, - palpable masses, - CVAT B.  Musculoskeletal: M/S 5/5 throughout, Extremities without ischemic changes. Bruises on the dorsum of both hands. Pill rolling motion of left hand.  Neurologic: CN 2-12 intact, Pain and light touch intact in extremities, Motor exam as listed above.         Non-Invasive Vascular Imaging  ABI (Date: 08/11/2015)  R: 1.05 (1.20, 04/01/14), DP: biphasic, PT: triphasic, TBI: 0.67  L: 1.04 (1.13), DP: biphasic, PT: triphasic, TBI: 0.65    Medical Decision Making  Fernando Carr is a 75 y.o. male who presents s/p open AAA repair on 07/07/2014.  Pt is asymptomatic with no claudication sx's.  Pt states he is no longer taking a daily 81 mg ASA, does not know why, states he was not advised to stop taking, denies any bleeding issues. Noted that he does have bruises on the dorsum of both hands. Will defer advising ASA use to pt's PCP.  I advised to to gradually increase walking to 30 minutes daily, most days of the week, under safe conditions.   Today's ABI's remain normal with bi and triphasic waveforms. Fortunately he quit smoking in December 2015 when he had the AAA repair.         After discussing with Dr. Donnetta Hutching, the patient will follow up with [place] as needed.   Thank you for allowing [place] to participate in this patient's care.  Clemon Chambers, RN, MSN, FNP-C [facility] [place] Office: [phone]   Clinic Physician: Early   08/11/2015, 11:41  AM    VASCULAR QUALITY INITIATIVE FOLLOW UP DATA:  Current smoker: [  ] yes  [ X ] no  Living status: [place]  ]  Home  [  ] Nursing home  [  ] Homeless    MEDS:  ASA [  ] yes  [place]  ] no- [ X ] medical reason  [  ] non compliant  STATIN  [place]  ] yes  [  ] no- [  ] medical reason  [  ] non compliant  Beta blocker [  ] yes  [place]  ] no- [place]u.[place]ieves  ] medical reason  [  ] non compliant  ACE inhibitor [  ] yes  [ X ] no- [ X ] medical reason  [  ] non compliant  P2Y12  Antagonist [ X ] none  [  ] clopidogrel-Plavix  [  ] ticlopidine-Ticlid   [  ] prasugrel-Effient  [  ] ticagrelor- Brilinta    Anticoagulant [place]u.[place]  ] None  [  ] warfarin  [  ] rivaroxaban-Xarelto [  ] dabigatran- Pradaxa  Number of subsequent operations related to AAA = 0  Subsequent operation performed for: [  ] Incision [  ] Graft [  ] Intestine       [  ] leg ischemia

## 2015-08-12 DIAGNOSIS — I739 Peripheral vascular disease, unspecified: Secondary | ICD-10-CM | POA: Diagnosis not present

## 2015-08-12 DIAGNOSIS — I714 Abdominal aortic aneurysm, without rupture: Secondary | ICD-10-CM | POA: Diagnosis not present

## 2015-08-12 DIAGNOSIS — E782 Mixed hyperlipidemia: Secondary | ICD-10-CM | POA: Diagnosis not present

## 2015-08-12 DIAGNOSIS — F3170 Bipolar disorder, currently in remission, most recent episode unspecified: Secondary | ICD-10-CM | POA: Diagnosis not present

## 2015-08-12 DIAGNOSIS — R7309 Other abnormal glucose: Secondary | ICD-10-CM | POA: Diagnosis not present

## 2015-08-12 DIAGNOSIS — Z23 Encounter for immunization: Secondary | ICD-10-CM | POA: Diagnosis not present

## 2015-08-12 DIAGNOSIS — N183 Chronic kidney disease, stage 3 (moderate): Secondary | ICD-10-CM | POA: Diagnosis not present

## 2015-09-01 DIAGNOSIS — H02833 Dermatochalasis of right eye, unspecified eyelid: Secondary | ICD-10-CM | POA: Diagnosis not present

## 2015-09-01 DIAGNOSIS — H35371 Puckering of macula, right eye: Secondary | ICD-10-CM | POA: Diagnosis not present

## 2015-09-01 DIAGNOSIS — H25013 Cortical age-related cataract, bilateral: Secondary | ICD-10-CM | POA: Diagnosis not present

## 2015-09-01 DIAGNOSIS — H2513 Age-related nuclear cataract, bilateral: Secondary | ICD-10-CM | POA: Diagnosis not present

## 2015-09-09 ENCOUNTER — Encounter (HOSPITAL_COMMUNITY): Payer: Self-pay | Admitting: Psychiatry

## 2015-09-09 ENCOUNTER — Ambulatory Visit (INDEPENDENT_AMBULATORY_CARE_PROVIDER_SITE_OTHER): Payer: Medicare Other | Admitting: Psychiatry

## 2015-09-09 ENCOUNTER — Other Ambulatory Visit (HOSPITAL_COMMUNITY): Payer: Self-pay

## 2015-09-09 VITALS — BP 108/80 | HR 68 | Ht 72.0 in | Wt 225.8 lb

## 2015-09-09 DIAGNOSIS — F319 Bipolar disorder, unspecified: Secondary | ICD-10-CM

## 2015-09-09 MED ORDER — AMANTADINE HCL 100 MG PO CAPS: ORAL_CAPSULE | ORAL | Status: DC

## 2015-09-09 MED ORDER — RISPERIDONE 0.5 MG PO TABS: ORAL_TABLET | ORAL | Status: DC

## 2015-09-09 MED ORDER — SERTRALINE HCL 100 MG PO TABS: ORAL_TABLET | ORAL | Status: DC

## 2015-09-09 NOTE — Telephone Encounter (Signed)
Telephone call with Delana Meyer, representative with [facility] to call in patient's new 90 day supplies of Amantadine, Sertraline, and Risperdal.  Collateral given [employer] information but states patient no longer has this coverage but now has [employer] plan coverage and medication cannot be called into their pharmacy.  Agreed to contact patient about incorrect insurance card to figure out where orders should now be called into as Dr. Adele Schilder authorized new 90 day orders for patient's Amantadine, Risperdal and Zoloft.  Called patient to follow up on current ability to call in new orders and informed patient the insurance card he provided this date is stating [facility] is not where patient should be getting orders and that the group number provided from his card left was incorrect per representative.  Arranged for patient to call his insurance company to assist this office with clarifying where new orders should be called in and patient will call back once information is found.  Informed Dr. Adele Schilder awaiting patient to call back with correct pharmacy so we could send in authorized 90 day new orders of patients' 3 prescriptions.

## 2015-09-09 NOTE — Telephone Encounter (Signed)
Called in Amantadine, Sertraline, and Risperdone 90 day new orders to [facility] in [place], [place] after patient called back and left a message this where his medications should now be ordered.  Called in medications with Bisi, pharmacist at [facility] and then called patient to inform 3 orders for 90 days each called into his new requested [facility] pharmacy this date and informed patient he should receive medications in a week to 10 days per pharmacist report.

## 2015-09-09 NOTE — Progress Notes (Signed)
[place] Progress Note  Darly Kos Muro [id number] 75 y.o.  09/09/2015 11:04 AM  Chief Complaint:  Medication management and follow-up.  History of Present Illness: Marquize came for his followup appointment.   He is disappointed because his daughter moved back to [place].  He admitted feeling boredom because he missed his grandchild.  However he reported his mood has been pretty much stable.  He denies any irritability, anger, mood swing.  He is nervous and anxious because he had cosigned loans and not Tax time is coming  and he waited about filing taxes.  Patient mentioned his daughter to start school in [place].  He recently seen Vein and vascular specialist and there has no changes.  He sleeping good but admitted gained weight in past 3-4 weeks.  He denies drinking or using any illegal substances.  He denies any mania or any psychosis.  He's taking his medication.  He has find tremors in his hand but it does not bother him.  His energy level is okay.  He denies drinking or using any illegal substances.  Apart from weight gain his vitals are stable.  Suicidal Ideation: No Plan Formed: No Patient has means to carry out plan: No  Homicidal Ideation: No Plan Formed: No Patient has means to carry out plan: No  Review of Systems: Psychiatric: Agitation: No Hallucination: No Depressed Mood: No Insomnia: No Hypersomnia: No Altered Concentration: No Feels Worthless: No Grandiose Ideas: No Belief In Special Powers: No New/Increased Substance Abuse: No Compulsions: No  Neurologic: Headache: No Seizure: No Paresthesias: No  Medical History:  Patient has a history of hyperlipidemia, obesity and recently started seeing Dr. Deforest Hoyles at [facility] physician.    Outpatient Encounter Prescriptions as of 09/09/2015  Medication Sig  . amantadine (SYMMETREL) 100 MG capsule TAKE 1 CAPSULE DAILY FOR DRUG INDUCED TREMORS  . aspirin EC 81 MG tablet Take 1 tablet (81 mg total) by  mouth daily. (Patient not taking: Reported on 08/11/2015)  . fenofibrate 54 MG tablet Reported on 08/11/2015  . Multiple Vitamins-Minerals (MULTIVITAMIN WITH MINERALS) tablet Take 1 tablet by mouth daily. Reported on 08/11/2015  . polyethylene glycol powder (GLYCOLAX/MIRALAX) powder Reported on 08/11/2015  . risperiDONE (RISPERDAL) 0.5 MG tablet TAKE 1 TABLET AT BEDTIME FOR MOOD STABILIZATION  . sertraline (ZOLOFT) 100 MG tablet TAKE 1 TABLET DAILY FOR DEPRESSION  . simvastatin (ZOCOR) 10 MG tablet Take 1 tablet (10 mg total) by mouth daily at 6 PM. (Patient not taking: Reported on 08/11/2015)   No facility-administered encounter medications on file as of 09/09/2015.   No results found for this or any previous visit (from the past 2160 hour(s)).  Past Psychiatric History/Hospitalization(s): Patient was admitted in December 2015 due to suicidal thoughts .  He has one more psychiatric inpatient treatment in 2006 due to suicidal attempt. He injected Ethyline Glycol. 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  He suffered significant loss including his property and he was feeling hopeless helpless and having paranoid thoughts.  In the past he had tried Abilify and Lexapro.  However he had a good response with Risperdal.     Anxiety: No Bipolar Disorder: No Depression: Yes Mania: No Psychosis: Yes Schizophrenia: No Personality Disorder: No Hospitalization for psychiatric illness: Yes History of Electroconvulsive Shock Therapy: No Prior Suicide Attempts: Yes  Physical Exam: Constitutional:  BP 108/80 mmHg  Pulse 68  Ht 6' (1.829 m)  Wt 225 lb 12.8 oz (102.422 kg)  BMI 30.62 kg/m2  General Appearance: alert, oriented, no acute distress and obese  Review of Systems  Skin: Negative for itching and rash.  Neurological: Positive for tremors. Negative for dizziness.  Psychiatric/Behavioral: Negative for suicidal ideas, hallucinations and substance abuse.    Musculoskeletal: Strength & Muscle Tone: within normal limits Gait & Station: unsteady Patient leans: N/A  Mental status examination : Patient is casually dressed and fairly groomed.  He is cooperative and maintained good eye contact. He described his mood euthymic and his affect is appropriate.  His speech is slow but coherent.  His attention and concentration is fair.  He denies any active or passive suicidal thoughts or homicidal thought.  He denies any paranoia , delusions or any obsessive thoughts.  He denies any auditory or visual hallucination.  His psychomotor activity is slow.   He is alert and oriented 3.  His cognition is intact.  His insight judgment and impulse control is okay.  Established Problem, Stable/Improving (1), Review of Psycho-Social Stressors (1), Review of Last Therapy Session (1) and Review of Medication Regimen & Side Effects (2)  Assessment: Axis I: Bipolar disorder type I   Axis II: Deferred  Axis III:  Patient Active Problem List   Diagnosis Date Noted  . MDD (major depressive disorder), recurrent severe, without psychosis ([place]) 08/20/2014  . 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  . Severe depression   . Major depressive disorder, recurrent, severe without psychotic features ([place])   . AAA (abdominal aortic aneurysm) ([place]) 07/07/2014  . AKI (acute kidney injury) ([place]) 03/10/2014  . Dehydration 03/10/2014  . FTT (failure to thrive) in adult 03/10/2014  . Aneurysm of right iliac artery ([place]) 03/10/2014  . Sinus bradycardia 03/10/2014  . HTN (hypertension) 03/10/2014  . Hypotension, postural 03/09/2014  . CRD (chronic renal disease), stage III 01/09/2014  . Abdominal aortic aneurysm ([place]) 12/20/2011  . Bipolar 1 disorder ([place]) 10/24/2011  . Hyperlipemia 10/10/2011    Plan: Patient is a stable on his current psychiatric medication.  discuss weight loss and encouraged to watch his calorie intake and to regular exercise.  Patient like to continue his current  psychiatric medication.  I will continue amantadine 100 mg daily, Risperdal 0.5 mg at bedtime and  Zoloft 100 mg daily.  Recommended to call us back if he has any question or any concern.  Patient is not interested in counseling.  patient is switching his [facility] and need a 90 day prescription .   Discuss safety plan that anytime having active suicidal thoughts or homicidal thoughts then he need to call 911 or go to the local emergency room.  Follow-up in 3 months.   Bernardina Cacho T., MD 09/09/2015

## 2015-09-24 IMAGING — CR DG CHEST 1V PORT
1 series · 1 of 1 positions shown · non-contrast
Comparison: 07/07/2014.

CLINICAL DATA: Abdominal aortic aneurysm repair.

EXAM:
PORTABLE CHEST - 1 VIEW

[portable]
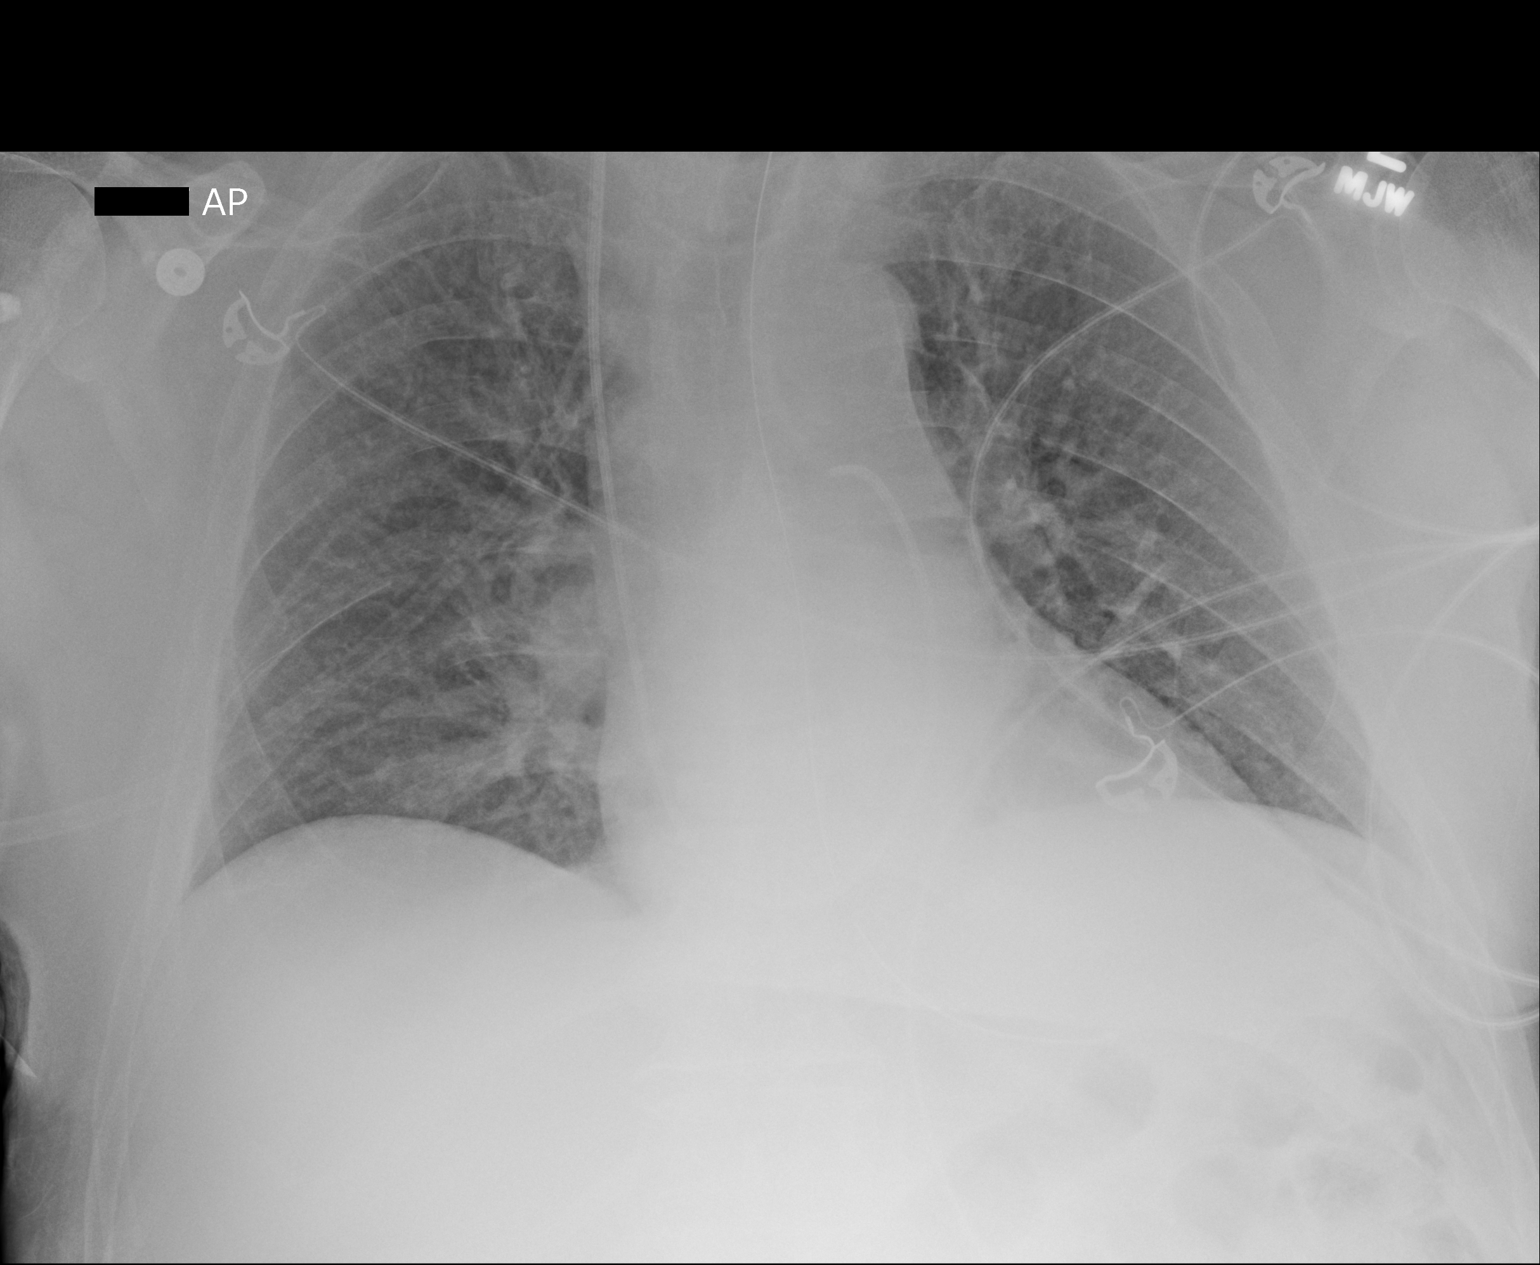

[1 of 1 positions shown; findings below may reference images not displayed]

FINDINGS: NG tube and Swan-Ganz catheter in stable position. Heart size
stable. No focal infiltrate. Stable cardiomegaly. No pulmonary
venous congestion. No pleural effusion or pneumothorax. Free air to
the hemidiaphragms again noted, most likely related to patient's
surgery.
IMPRESSION: 1. NG tube is Swan-Ganz catheter stable position.
2. No acute cardiopulmonary disease.
3. Free air in the hemidiaphragm again noted, most likely related to
patient's surgery.

## 2015-10-27 DIAGNOSIS — F329 Major depressive disorder, single episode, unspecified: Secondary | ICD-10-CM | POA: Diagnosis not present

## 2015-10-27 DIAGNOSIS — I714 Abdominal aortic aneurysm, without rupture: Secondary | ICD-10-CM | POA: Diagnosis not present

## 2015-10-27 DIAGNOSIS — I129 Hypertensive chronic kidney disease with stage 1 through stage 4 chronic kidney disease, or unspecified chronic kidney disease: Secondary | ICD-10-CM | POA: Diagnosis not present

## 2015-10-27 DIAGNOSIS — R7989 Other specified abnormal findings of blood chemistry: Secondary | ICD-10-CM | POA: Diagnosis not present

## 2015-10-27 DIAGNOSIS — N189 Chronic kidney disease, unspecified: Secondary | ICD-10-CM | POA: Diagnosis not present

## 2015-12-07 ENCOUNTER — Ambulatory Visit (HOSPITAL_COMMUNITY): Payer: Self-pay | Admitting: Psychiatry

## 2015-12-17 ENCOUNTER — Ambulatory Visit (INDEPENDENT_AMBULATORY_CARE_PROVIDER_SITE_OTHER): Payer: Medicare Other | Admitting: Psychiatry

## 2015-12-17 ENCOUNTER — Encounter (HOSPITAL_COMMUNITY): Payer: Self-pay | Admitting: Psychiatry

## 2015-12-17 VITALS — BP 150/80 | HR 79 | Ht 72.0 in | Wt 228.2 lb

## 2015-12-17 DIAGNOSIS — F319 Bipolar disorder, unspecified: Secondary | ICD-10-CM

## 2015-12-17 MED ORDER — RISPERIDONE 0.5 MG PO TABS: ORAL_TABLET | ORAL | Status: DC

## 2015-12-17 MED ORDER — AMANTADINE HCL 100 MG PO CAPS: ORAL_CAPSULE | ORAL | Status: DC

## 2015-12-17 MED ORDER — SERTRALINE HCL 100 MG PO TABS: ORAL_TABLET | ORAL | Status: DC

## 2015-12-17 NOTE — Progress Notes (Signed)
[place] Progress Note  Fernando Carr [id number] 75 y.o.  12/17/2015 10:49 AM  Chief Complaint:  I stop taking Risperdal and amantadine because I want to lose weight .  However I have not lost a trait and I'm not sleeping well.    History of Present Illness: Fernando Carr came for his followup appointment.  He is excited as his daughter back from [place] and now he is able to see his grandchild every day.  He is dropping his grandchild to school every day and having lunch with him.  He is very happy.  He stop the Risperdal and amantadine believing that he may lose weight but he has not lost weight and now started having insomnia and anxiety.  He promised that he will go back and a start Risperdal which was helping his sleep and depression.  Overall he described his mood is better since he is seeing grandchild every day.  He denies any paranoia, hallucination, suicidal thoughts or homicidal thought.  He has no tremors or shakes.  He denies drinking alcohol or using any illegal substances.  His appetite is okay.  His blood pressure is slightly increased which she believed due to lack of sleep.     Suicidal Ideation: No Plan Formed: No Patient has means to carry out plan: No  Homicidal Ideation: No Plan Formed: No Patient has means to carry out plan: No  Review of Systems: Psychiatric: Agitation: No Hallucination: No Depressed Mood: No Insomnia: Yes Hypersomnia: No Altered Concentration: No Feels Worthless: No Grandiose Ideas: No Belief In Special Powers: No New/Increased Substance Abuse: No Compulsions: No  Neurologic: Headache: No Seizure: No Paresthesias: No  Medical History:  Patient has a history of hyperlipidemia, obesity and recently started seeing Dr. Deforest Hoyles at [facility] physician.    Outpatient Encounter Prescriptions as of 12/17/2015  Medication Sig  . aspirin EC 81 MG tablet Take 1 tablet (81 mg total) by mouth daily.  . fenofibrate 54 MG tablet Reported  on 08/11/2015  . Multiple Vitamins-Minerals (MULTIVITAMIN WITH MINERALS) tablet Take 1 tablet by mouth daily. Reported on 08/11/2015  . polyethylene glycol powder (GLYCOLAX/MIRALAX) powder Reported on 08/11/2015  . sertraline (ZOLOFT) 100 MG tablet TAKE 1 TABLET DAILY FOR DEPRESSION  . simvastatin (ZOCOR) 10 MG tablet Take 1 tablet (10 mg total) by mouth daily at 6 PM.  . [DISCONTINUED] sertraline (ZOLOFT) 100 MG tablet TAKE 1 TABLET DAILY FOR DEPRESSION  . amantadine (SYMMETREL) 100 MG capsule TAKE 1 CAPSULE DAILY FOR DRUG INDUCED TREMORS  . risperiDONE (RISPERDAL) 0.5 MG tablet TAKE 1 TABLET AT BEDTIME FOR MOOD STABILIZATION  . [DISCONTINUED] amantadine (SYMMETREL) 100 MG capsule TAKE 1 CAPSULE DAILY FOR DRUG INDUCED TREMORS (Patient not taking: Reported on 12/17/2015)  . [DISCONTINUED] risperiDONE (RISPERDAL) 0.5 MG tablet TAKE 1 TABLET AT BEDTIME FOR MOOD STABILIZATION (Patient not taking: Reported on 12/17/2015)   No facility-administered encounter medications on file as of 12/17/2015.   No results found for this or any previous visit (from the past 2160 hour(s)).  Past Psychiatric History/Hospitalization(s): Patient was admitted in December 2015 due to suicidal thoughts .  He has one more psychiatric inpatient treatment in 2006 due to suicidal attempt. He injected Ethyline Glycol. 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  He suffered significant loss including his property and he was feeling hopeless helpless and having paranoid thoughts.  In the past he had tried Abilify and Lexapro.  However he had a good response with Risperdal.  Anxiety: No Bipolar Disorder: No Depression: Yes Mania: No Psychosis: Yes Schizophrenia: No Personality Disorder: No Hospitalization for psychiatric illness: Yes History of Electroconvulsive Shock Therapy: No Prior Suicide Attempts: Yes  Physical Exam: Constitutional:  BP 150/80 mmHg  Pulse 79  Ht 6' (1.829 m)  Wt 228 lb 3.2  oz (103.511 kg)  BMI 30.94 kg/m2  General Appearance: alert, oriented, no acute distress and obese  Review of Systems  Skin: Negative for itching and rash.  Neurological: Positive for tremors. Negative for dizziness.  Psychiatric/Behavioral: Negative for suicidal ideas, hallucinations and substance abuse.   Musculoskeletal: Strength & Muscle Tone: within normal limits Gait & Station: unsteady Patient leans: N/A  Mental status examination : Patient is casually dressed and fairly groomed.  He is cooperative and maintained good eye contact. He described his mood anxious and his affect is appropriate.  He appears tired.  His speech is slow but coherent.  His attention and concentration is fair.  He denies any active or passive suicidal thoughts or homicidal thought.  He denies any paranoia , delusions or any obsessive thoughts.  He denies any auditory or visual hallucination.  His psychomotor activity is slow.   He is alert and oriented 3.  His cognition is intact.  His insight judgment and impulse control is okay.  Established Problem, Stable/Improving (1), Review of Psycho-Social Stressors (1), Established Problem, Worsening (2), Review of Last Therapy Session (1) and Review of Medication Regimen & Side Effects (2)  Assessment: Axis I: Bipolar disorder type I   Axis II: Deferred  Axis III:  Patient Active Problem List   Diagnosis Date Noted  . MDD (major depressive disorder), recurrent severe, without psychosis ([place]) 08/20/2014  . 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  . Severe depression   . Major depressive disorder, recurrent, severe without psychotic features ([place])   . AAA (abdominal aortic aneurysm) ([place]) 07/07/2014  . AKI (acute kidney injury) ([place]) 03/10/2014  . Dehydration 03/10/2014  . FTT (failure to thrive) in adult 03/10/2014  . 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 Sinus bradycardia 03/10/2014  . HTN (hypertension) 03/10/2014  . Hypotension, postural 03/09/2014   . CRD (chronic renal disease), stage III 01/09/2014  . Abdominal aortic aneurysm ([place]) 12/20/2011  . Bipolar 1 disorder ([place]) 10/24/2011  . Hyperlipemia 10/10/2011    Plan: Discussed risk of relapse due to noncompliance with medication.  Patient to college and agreed that he will go back on Risperdal and amantadine since he hasn't not noticed any weight loss by stopping Risperdal.  He is concerned about his lack of sleep and he relies if he does not take the medication he will get sick.  I also encouraged him to do the exercise and watch his calorie intake.  Continue amantadine 100 mg daily, Resporal 0.5 mg at bedtime and Zoloft milligrams daily. Patient is not interested in counseling.  Discuss safety plan that anytime having active suicidal thoughts or homicidal thoughts then he need to call 911 or go to the local emergency room.  Follow-up in 3 months.   Jonda Alanis T., MD 12/17/2015

## 2016-02-01 ENCOUNTER — Encounter: Payer: Self-pay | Admitting: Cardiology

## 2016-02-10 DIAGNOSIS — I739 Peripheral vascular disease, unspecified: Secondary | ICD-10-CM | POA: Diagnosis not present

## 2016-02-10 DIAGNOSIS — R7309 Other abnormal glucose: Secondary | ICD-10-CM | POA: Diagnosis not present

## 2016-02-10 DIAGNOSIS — R251 Tremor, unspecified: Secondary | ICD-10-CM | POA: Diagnosis not present

## 2016-02-10 DIAGNOSIS — N183 Chronic kidney disease, stage 3 (moderate): Secondary | ICD-10-CM | POA: Diagnosis not present

## 2016-02-10 DIAGNOSIS — F3170 Bipolar disorder, currently in remission, most recent episode unspecified: Secondary | ICD-10-CM | POA: Diagnosis not present

## 2016-02-10 DIAGNOSIS — Z1389 Encounter for screening for other disorder: Secondary | ICD-10-CM | POA: Diagnosis not present

## 2016-02-10 DIAGNOSIS — Z125 Encounter for screening for malignant neoplasm of prostate: Secondary | ICD-10-CM | POA: Diagnosis not present

## 2016-02-10 DIAGNOSIS — E782 Mixed hyperlipidemia: Secondary | ICD-10-CM | POA: Diagnosis not present

## 2016-02-10 DIAGNOSIS — Z7189 Other specified counseling: Secondary | ICD-10-CM | POA: Diagnosis not present

## 2016-02-10 DIAGNOSIS — Z0000 Encounter for general adult medical examination without abnormal findings: Secondary | ICD-10-CM | POA: Diagnosis not present

## 2016-02-10 DIAGNOSIS — Z6834 Body mass index (BMI) 34.0-34.9, adult: Secondary | ICD-10-CM | POA: Diagnosis not present

## 2016-02-23 ENCOUNTER — Other Ambulatory Visit (HOSPITAL_COMMUNITY): Payer: Self-pay

## 2016-02-23 DIAGNOSIS — F319 Bipolar disorder, unspecified: Secondary | ICD-10-CM

## 2016-02-23 MED ORDER — SERTRALINE HCL 100 MG PO TABS: ORAL_TABLET | ORAL | 0 refills | Status: DC

## 2016-02-23 MED ORDER — RISPERIDONE 0.5 MG PO TABS: ORAL_TABLET | ORAL | 0 refills | Status: DC

## 2016-02-23 MED ORDER — AMANTADINE HCL 100 MG PO CAPS: ORAL_CAPSULE | ORAL | 0 refills | Status: DC

## 2016-02-26 DIAGNOSIS — H35032 Hypertensive retinopathy, left eye: Secondary | ICD-10-CM | POA: Diagnosis not present

## 2016-02-26 DIAGNOSIS — H35363 Drusen (degenerative) of macula, bilateral: Secondary | ICD-10-CM | POA: Diagnosis not present

## 2016-02-26 DIAGNOSIS — H2513 Age-related nuclear cataract, bilateral: Secondary | ICD-10-CM | POA: Diagnosis not present

## 2016-02-26 DIAGNOSIS — H35031 Hypertensive retinopathy, right eye: Secondary | ICD-10-CM | POA: Diagnosis not present

## 2016-02-26 DIAGNOSIS — H35361 Drusen (degenerative) of macula, right eye: Secondary | ICD-10-CM | POA: Diagnosis not present

## 2016-02-26 DIAGNOSIS — H35371 Puckering of macula, right eye: Secondary | ICD-10-CM | POA: Diagnosis not present

## 2016-02-26 DIAGNOSIS — H25013 Cortical age-related cataract, bilateral: Secondary | ICD-10-CM | POA: Diagnosis not present

## 2016-03-16 DIAGNOSIS — H25012 Cortical age-related cataract, left eye: Secondary | ICD-10-CM | POA: Diagnosis not present

## 2016-03-16 DIAGNOSIS — H25812 Combined forms of age-related cataract, left eye: Secondary | ICD-10-CM | POA: Diagnosis not present

## 2016-03-16 DIAGNOSIS — H2512 Age-related nuclear cataract, left eye: Secondary | ICD-10-CM | POA: Diagnosis not present

## 2016-03-17 ENCOUNTER — Ambulatory Visit (HOSPITAL_COMMUNITY): Payer: Self-pay | Admitting: Psychiatry

## 2016-03-23 DIAGNOSIS — H2511 Age-related nuclear cataract, right eye: Secondary | ICD-10-CM | POA: Diagnosis not present

## 2016-03-23 DIAGNOSIS — H25011 Cortical age-related cataract, right eye: Secondary | ICD-10-CM | POA: Diagnosis not present

## 2016-04-06 DIAGNOSIS — H25011 Cortical age-related cataract, right eye: Secondary | ICD-10-CM | POA: Diagnosis not present

## 2016-04-06 DIAGNOSIS — H2511 Age-related nuclear cataract, right eye: Secondary | ICD-10-CM | POA: Diagnosis not present

## 2016-04-06 DIAGNOSIS — H25811 Combined forms of age-related cataract, right eye: Secondary | ICD-10-CM | POA: Diagnosis not present

## 2016-04-26 ENCOUNTER — Ambulatory Visit (INDEPENDENT_AMBULATORY_CARE_PROVIDER_SITE_OTHER): Payer: Medicare Other | Admitting: Psychiatry

## 2016-04-26 ENCOUNTER — Encounter (HOSPITAL_COMMUNITY): Payer: Self-pay | Admitting: Psychiatry

## 2016-04-26 DIAGNOSIS — F319 Bipolar disorder, unspecified: Secondary | ICD-10-CM | POA: Diagnosis not present

## 2016-04-26 MED ORDER — SERTRALINE HCL 100 MG PO TABS: ORAL_TABLET | ORAL | 0 refills | Status: DC

## 2016-04-26 MED ORDER — RISPERIDONE 0.5 MG PO TABS: ORAL_TABLET | ORAL | 0 refills | Status: DC

## 2016-04-26 NOTE — Progress Notes (Signed)
[place] Progress Note  Fernando Carr [id number] 75 y.o.  04/26/2016 3:44 PM  Chief Complaint:    medication management and follow-up.     History of Present Illness: [name] came for his followup appointment.   Patient is taking Risperdal and Zoloft.  He sleeping better.  He does not want to take amantadine because he does not believe it helps the tremors.  Patient told his physician told that he has benign tremors.  Recently he felt that he has bugs in his house and he is trying to contact pest control to look into it.  He admitted since restart taking Risperdal his mood and depression is much better.  Sometimes he gets upset with his daughter who continues to demand more money even though he is trying to help her as much possible he can do.  Patient denies any agitation, suicidal thoughts or homicidal thought.  His appetite is fair.  His energy level is okay.  He wants to continue Risperdal and Zoloft.  Patient denies drinking or using any illegal substances.  Suicidal Ideation: No Plan Formed: No Patient has means to carry out plan: No  Homicidal Ideation: No Plan Formed: No Patient has means to carry out plan: No  Review of Systems: Psychiatric: Agitation: No Hallucination: No Depressed Mood: No Insomnia: No Hypersomnia: No Altered Concentration: No Feels Worthless: No Grandiose Ideas: No Belief In Special Powers: No New/Increased Substance Abuse: No Compulsions: No  Neurologic: Headache: No Seizure: No Paresthesias: No  Medical History:  Patient has a history of hyperlipidemia, obesity and recently started seeing Dr. Deforest Hoyles at [facility] physician.    Outpatient Encounter Prescriptions as of 04/26/2016  Medication Sig Dispense Refill  . aspirin EC 81 MG tablet Take 1 tablet (81 mg total) by mouth daily. 30 tablet 0  . fenofibrate 54 MG tablet Reported on 08/11/2015    . Multiple Vitamins-Minerals (MULTIVITAMIN WITH MINERALS) tablet Take 1 tablet by mouth  daily. Reported on 08/11/2015    . polyethylene glycol powder (GLYCOLAX/MIRALAX) powder Reported on 08/11/2015    . risperiDONE (RISPERDAL) 0.5 MG tablet TAKE 1 TABLET AT BEDTIME FOR MOOD STABILIZATION 90 tablet 0  . sertraline (ZOLOFT) 100 MG tablet TAKE 1 TABLET DAILY FOR DEPRESSION 90 tablet 0  . simvastatin (ZOCOR) 10 MG tablet Take 1 tablet (10 mg total) by mouth daily at 6 PM.    . [DISCONTINUED] amantadine (SYMMETREL) 100 MG capsule TAKE 1 CAPSULE DAILY FOR DRUG INDUCED TREMORS 90 capsule 0  . [DISCONTINUED] risperiDONE (RISPERDAL) 0.5 MG tablet TAKE 1 TABLET AT BEDTIME FOR MOOD STABILIZATION 90 tablet 0  . [DISCONTINUED] sertraline (ZOLOFT) 100 MG tablet TAKE 1 TABLET DAILY FOR DEPRESSION 90 tablet 0   No facility-administered encounter medications on file as of 04/26/2016.    No results found for this or any previous visit (from the past 2160 hour(s)).  Past Psychiatric History/Hospitalization(s): Patient was admitted in December 2015 due to suicidal thoughts .  He has one more psychiatric inpatient treatment in 2006 due to suicidal attempt. He injected Ethyline Glycol. 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  He suffered significant loss including his property and he was feeling hopeless helpless and having paranoid thoughts.  In the past he had tried Abilify and Lexapro.  However he had a good response with Risperdal.     Anxiety: No Bipolar Disorder: No Depression: Yes Mania: No Psychosis: Yes Schizophrenia: No Personality Disorder: No Hospitalization for psychiatric illness: Yes History of  Electroconvulsive Shock Therapy: No Prior Suicide Attempts: Yes  Physical Exam: Constitutional:  BP 130/80 (BP Location: Right Arm, Patient Position: Sitting, Cuff Size: Normal)   Pulse 63   Ht 6' (1.829 m)   Wt 237 lb (107.5 kg)   SpO2 97%   BMI 32.14 kg/m   General Appearance: alert, oriented, no acute distress and obese  Review of Systems  Skin:  Negative for itching and rash.  Neurological: Positive for tremors. Negative for dizziness.  Psychiatric/Behavioral: Negative for hallucinations, substance abuse and suicidal ideas.   Musculoskeletal: Strength & Muscle Tone: within normal limits Gait & Station: unsteady Patient leans: N/A  Mental status examination : Patient is  fairly dressed and groomed. He is cooperative and maintained good eye contact. He described his mood anxious and his affect is appropriate.  He appears tired.  His speech is slow but coherent.  His attention and concentration is fair.  He denies any active or passive suicidal thoughts or homicidal thought.  He denies any paranoia , delusions or any obsessive thoughts.  He denies any auditory or visual hallucination.  His psychomotor activity is slow.   He is alert and oriented 3.  His cognition is intact.  His insight judgment and impulse control is okay.  Established Problem, Stable/Improving (1), Review of Psycho-Social Stressors (1), Review of Last Therapy Session (1) and Review of Medication Regimen & Side Effects (2)  Assessment: Axis I: Bipolar disorder type I   Axis II: Deferred  Axis III:  Patient Active Problem List  Diagnosis  . Hyperlipemia  . Bipolar 1 disorder ([place])  . Abdominal aortic aneurysm ([place])  . CRD (chronic renal disease), stage III  . Hypotension, postural  . AKI (acute kidney injury) ([place])  . Dehydration  . FTT (failure to thrive) in adult  . Aneurysm of right iliac artery ([facility])  . Sinus bradycardia  . HTN (hypertension)  . AAA (abdominal aortic aneurysm) ([place])  . Severe depression  . Major depressive disorder, recurrent, severe without psychotic features ([place])  . MDD (major depressive disorder), recurrent severe, without psychosis ([place])  . Movement disorder    Plan: Patient is a stable on his current psychiatric medication.  I will discontinue amantadine since he is no longer taking it.  Continue Risperdal 0.5 mg at bedtime  and Zoloft her milligrams daily.  Discussed medication side effects and benefits.  Recommended to call us back if has any question or any concern.  Follow-up in 3 months.   Cortney Mckinney T., MD 04/26/2016                      Patient ID: Fernando Carr, male   DOB: Jul 30, 1941, 75 y.o.   MRN: [id number]

## 2016-05-09 DIAGNOSIS — Z23 Encounter for immunization: Secondary | ICD-10-CM | POA: Diagnosis not present

## 2016-07-04 ENCOUNTER — Other Ambulatory Visit (HOSPITAL_COMMUNITY): Payer: Self-pay

## 2016-07-04 DIAGNOSIS — F319 Bipolar disorder, unspecified: Secondary | ICD-10-CM

## 2016-07-04 MED ORDER — SERTRALINE HCL 100 MG PO TABS: ORAL_TABLET | ORAL | 0 refills | Status: DC

## 2016-07-12 ENCOUNTER — Ambulatory Visit (HOSPITAL_COMMUNITY): Payer: Self-pay | Admitting: Psychiatry

## 2016-08-03 ENCOUNTER — Encounter (HOSPITAL_COMMUNITY): Payer: Self-pay | Admitting: Psychiatry

## 2016-08-03 ENCOUNTER — Ambulatory Visit (INDEPENDENT_AMBULATORY_CARE_PROVIDER_SITE_OTHER): Payer: Medicare Other | Admitting: Psychiatry

## 2016-08-03 DIAGNOSIS — Z9889 Other specified postprocedural states: Secondary | ICD-10-CM | POA: Diagnosis not present

## 2016-08-03 DIAGNOSIS — Z87891 Personal history of nicotine dependence: Secondary | ICD-10-CM

## 2016-08-03 DIAGNOSIS — F319 Bipolar disorder, unspecified: Secondary | ICD-10-CM

## 2016-08-03 DIAGNOSIS — Z79899 Other long term (current) drug therapy: Secondary | ICD-10-CM

## 2016-08-03 DIAGNOSIS — Z818 Family history of other mental and behavioral disorders: Secondary | ICD-10-CM | POA: Diagnosis not present

## 2016-08-03 DIAGNOSIS — Z811 Family history of alcohol abuse and dependence: Secondary | ICD-10-CM | POA: Diagnosis not present

## 2016-08-03 DIAGNOSIS — Z7982 Long term (current) use of aspirin: Secondary | ICD-10-CM

## 2016-08-03 MED ORDER — SERTRALINE HCL 100 MG PO TABS: ORAL_TABLET | ORAL | 0 refills | Status: DC

## 2016-08-03 MED ORDER — RISPERIDONE 0.5 MG PO TABS: ORAL_TABLET | ORAL | 0 refills | Status: DC

## 2016-08-03 NOTE — Progress Notes (Signed)
[place] MD/PA/NP OP Progress Note  08/03/2016 11:49 AM Fernando Carr  MRN:  [id number]  Chief Complaint:  Chief Complaint    Follow-up     Subjective:    I'm doing fine.  I need my refills.  HPI:   Fernando Carr came for her follow-up appointment.  He is taking his medication as prescribed.  He had a quiet Christmas.  Excited because recently he had a granddaughter. Now he has 3 grandchildren  He was able to see them day after Christmas.  He is helping his daughter by paying car payment.  He is not sure how his daughter is affording because he believes she has a very high lifestyle.  Overall he described his mood and depression is a stable.  He does not want to stop Risperdal and Zoloft.  He has mild tremors but no other concerns.  He saw his primary care physician in September and he had blood work on September 4 which shows creatinine 1.57, GFR 44, total cholesterol 206.  He is taking cholesterol-lowering medication.  He is scheduled to see primary care physician again next week.  Patient denies any agitation, anger, mania, psychosis or any hallucination.  He sleeping good.  His energy level is fair.  He also continue Risperdal and Zoloft.  He does not feel that he need to see a therapist.  His energy level is fair.  His vital signs are stable.  Visit Diagnosis:    ICD-9-CM ICD-10-CM   1. Bipolar 1 disorder (HCC) 296.7 F31.9 risperiDONE (RISPERDAL) 0.5 MG tablet     sertraline (ZOLOFT) 100 MG tablet    Past Psychiatric History: Reviewed  Past Medical History:  Past Medical History:  Diagnosis Date  . AAA (abdominal aortic aneurysm) ([place])   . AAA (abdominal aortic aneurysm) ([place])   . Bipolar disorder ([place])   . Depression   . High cholesterol   . Renal insufficiency    chronic renal   . Suicide attempt aug. 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 ABDOMINAL AORTIC ANEURYSM REPAIR N/A 07/07/2014   Procedure: ABDOMINAL AORTIC  ANEURYSM REPAIR;  Surgeon: Rosetta Posner, MD;  Location: [facility] OR;  Service: Vascular;  Laterality: N/A;  . ABDOMINAL AORTIC ANEURYSM REPAIR  07-07-14   Dr. Donnetta Hutching  . KNEE ARTHROSCOPY Left 1972    Family Psychiatric History: Reviewed  Family History:  Family History  Problem Relation Age of Onset  . Alcohol abuse Mother   . Alcohol abuse Father   . Suicidality Paternal Uncle   . Suicidality Cousin   . Depression Daughter     Social History:  Social History   Social History  . Marital status: Single    Spouse name: N/A  . Number of children: N/A  . Years of education: N/A   Social History Main Topics  . Smoking status: Former Smoker    Packs/day: 1.00    Years: 40.00    Types: Cigarettes    Quit date: 03/11/2014  . Smokeless tobacco: Never Used  . Alcohol use No  . Drug use: No  . Sexual activity: No   Other Topics Concern  . None   Social History Narrative  . None    Allergies: No Known Allergies  Metabolic Disorder Labs: Lab Results  Component Value Date   HGBA1C 5.6 07/20/2012   MPG 114 07/20/2012   MPG 120 (H) 10/21/2010   No results found for:  PROLACTIN No results found for: CHOL, TRIG, HDL, CHOLHDL, VLDL, LDLCALC   Current Medications: Current Outpatient Prescriptions  Medication Sig Dispense Refill  . aspirin EC 81 MG tablet Take 1 tablet (81 mg total) by mouth daily. 30 tablet 0  . fenofibrate 54 MG tablet Reported on 08/11/2015    . Multiple Vitamins-Minerals (MULTIVITAMIN WITH MINERALS) tablet Take 1 tablet by mouth daily. Reported on 08/11/2015    . polyethylene glycol powder (GLYCOLAX/MIRALAX) powder Reported on 08/11/2015    . risperiDONE (RISPERDAL) 0.5 MG tablet TAKE 1 TABLET AT BEDTIME FOR MOOD STABILIZATION 90 tablet 0  . sertraline (ZOLOFT) 100 MG tablet TAKE 1 TABLET DAILY FOR DEPRESSION 90 tablet 0  . simvastatin (ZOCOR) 10 MG tablet Take 1 tablet (10 mg total) by mouth daily at 6 PM.     No current facility-administered medications for  this visit.     Neurologic: Headache: No Seizure: No Paresthesias: No  Musculoskeletal: Strength & Muscle Tone: within normal limits Gait & Station: normal Patient leans: N/A  Psychiatric Specialty Exam: Review of Systems  Constitutional: Negative.   HENT: Negative.   Musculoskeletal: Negative.   Skin: Negative.  Negative for itching and rash.  Neurological: Positive for tremors.    Blood pressure 122/70, pulse 98, height 6' (1.829 m), weight 235 lb 9.6 oz (106.9 kg).Body mass index is 31.95 kg/m.  General Appearance: Casual  Eye Contact:  Fair  Speech:  Slow  Volume:  Normal  Mood:  Euthymic  Affect:  Appropriate  Thought Process:  Goal Directed  Orientation:  Full (Time, Place, and Person)  Thought Content: WDL and Logical   Suicidal Thoughts:  No  Homicidal Thoughts:  No  Memory:  Immediate;   Good Recent;   Fair Remote;   Fair  Judgement:  Good  Insight:  Good  Psychomotor Activity:  Tremor and mild tremors in hand  Concentration:  Concentration: Fair and Attention Span: Fair  Recall:  [employer] of Knowledge: Good  Language: Good  Akathisia:  No  Handed:  Right  AIMS (if indicated):  none  Assets:  Communication Skills Desire for Improvement Housing  ADL's:  Intact  Cognition: WNL  Sleep:  good     Assessment ; Fernando Carr is 76 year old Caucasian man who was history of bipolar disorder and currently stable on low-dose Risperdal and Zoloft.  He like to continue his current psychiatric medication.  Plan; I review his blood work results which was done in September.  Discussed medication side effects and benefits.  I will continue Zoloft 100 mg daily and Risperdal 0.5 mg at bedtime.  Discuss safety plan that anytime having active suicidal thoughts or homicidal thoughts and he need to call 911 or go to the local emergency room.  Follow-up in 3 months.       Fernando Carr T., MD 08/03/2016, 11:49 AM

## 2016-08-12 DIAGNOSIS — N183 Chronic kidney disease, stage 3 (moderate): Secondary | ICD-10-CM | POA: Diagnosis not present

## 2016-08-12 DIAGNOSIS — E782 Mixed hyperlipidemia: Secondary | ICD-10-CM | POA: Diagnosis not present

## 2016-08-12 DIAGNOSIS — I739 Peripheral vascular disease, unspecified: Secondary | ICD-10-CM | POA: Diagnosis not present

## 2016-08-12 DIAGNOSIS — R69 Illness, unspecified: Secondary | ICD-10-CM | POA: Diagnosis not present

## 2016-10-26 DIAGNOSIS — I129 Hypertensive chronic kidney disease with stage 1 through stage 4 chronic kidney disease, or unspecified chronic kidney disease: Secondary | ICD-10-CM | POA: Diagnosis not present

## 2016-10-26 DIAGNOSIS — R7989 Other specified abnormal findings of blood chemistry: Secondary | ICD-10-CM | POA: Diagnosis not present

## 2016-10-26 DIAGNOSIS — R69 Illness, unspecified: Secondary | ICD-10-CM | POA: Diagnosis not present

## 2016-10-26 DIAGNOSIS — I714 Abdominal aortic aneurysm, without rupture: Secondary | ICD-10-CM | POA: Diagnosis not present

## 2016-11-01 DIAGNOSIS — Z961 Presence of intraocular lens: Secondary | ICD-10-CM | POA: Diagnosis not present

## 2016-11-01 DIAGNOSIS — H35363 Drusen (degenerative) of macula, bilateral: Secondary | ICD-10-CM | POA: Diagnosis not present

## 2016-11-01 DIAGNOSIS — H35033 Hypertensive retinopathy, bilateral: Secondary | ICD-10-CM | POA: Diagnosis not present

## 2016-11-08 ENCOUNTER — Ambulatory Visit (INDEPENDENT_AMBULATORY_CARE_PROVIDER_SITE_OTHER): Payer: Medicare HMO | Admitting: Psychiatry

## 2016-11-08 ENCOUNTER — Encounter (HOSPITAL_COMMUNITY): Payer: Self-pay | Admitting: Psychiatry

## 2016-11-08 VITALS — BP 136/76 | HR 67 | Ht 72.0 in | Wt 223.0 lb

## 2016-11-08 DIAGNOSIS — Z818 Family history of other mental and behavioral disorders: Secondary | ICD-10-CM | POA: Diagnosis not present

## 2016-11-08 DIAGNOSIS — R251 Tremor, unspecified: Secondary | ICD-10-CM

## 2016-11-08 DIAGNOSIS — F319 Bipolar disorder, unspecified: Secondary | ICD-10-CM | POA: Diagnosis not present

## 2016-11-08 DIAGNOSIS — Z811 Family history of alcohol abuse and dependence: Secondary | ICD-10-CM

## 2016-11-08 DIAGNOSIS — R69 Illness, unspecified: Secondary | ICD-10-CM | POA: Diagnosis not present

## 2016-11-08 DIAGNOSIS — Z87891 Personal history of nicotine dependence: Secondary | ICD-10-CM

## 2016-11-08 DIAGNOSIS — Z79899 Other long term (current) drug therapy: Secondary | ICD-10-CM | POA: Diagnosis not present

## 2016-11-08 MED ORDER — SERTRALINE HCL 100 MG PO TABS: ORAL_TABLET | ORAL | 0 refills | Status: DC

## 2016-11-08 MED ORDER — RISPERIDONE 0.5 MG PO TABS
ORAL_TABLET | ORAL | 0 refills | Status: DC
Start: 2016-11-08 — End: 2017-02-07
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
## 2016-11-23 ENCOUNTER — Ambulatory Visit (INDEPENDENT_AMBULATORY_CARE_PROVIDER_SITE_OTHER): Payer: Medicare HMO | Admitting: Diagnostic Neuroimaging

## 2016-11-23 ENCOUNTER — Encounter: Payer: Self-pay | Admitting: Diagnostic Neuroimaging

## 2016-11-23 ENCOUNTER — Other Ambulatory Visit: Payer: Self-pay | Admitting: Diagnostic Neuroimaging

## 2016-11-23 VITALS — BP 135/76 | HR 59 | Ht 72.0 in | Wt 225.0 lb

## 2016-11-23 DIAGNOSIS — R259 Unspecified abnormal involuntary movements: Secondary | ICD-10-CM

## 2016-11-23 DIAGNOSIS — G252 Other specified forms of tremor: Secondary | ICD-10-CM

## 2016-11-23 DIAGNOSIS — G20 Parkinson's disease: Secondary | ICD-10-CM
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
## 2016-11-23 NOTE — Patient Instructions (Signed)
Thank you for coming to see [place] at [facility]. I hope we have been able to provide you high quality care today.  You may receive a patient satisfaction survey over the next few weeks. We would appreciate your feedback and comments so that we may continue to improve ourselves and the health of our patients.  - check MRI brain and DAT scans  - refer to physical therapy   ~~~~~~~~~~~~~~~~~~~~~~~~~~~~~~~~~~~~~~~~~~~~~~~~~~~~~~~~~~~~~~~~~  DR. Karalina Tift'S GUIDE TO HAPPY AND HEALTHY LIVING These are some of my general health and wellness recommendations. Some of them may apply to you better than others. Please use common sense as you try these suggestions and feel free to ask me any questions.   ACTIVITY/FITNESS Mental, social, emotional and physical stimulation are very important for brain and body health. Try learning a new activity (arts, music, language, sports, games).  Keep moving your body to the best of your abilities. You can do this at home, inside or outside, the park, community center, gym or anywhere you like. Consider a physical therapist or personal trainer to get started. Consider the app Sworkit. Fitness trackers such as smart-watches, smart-phones or Fitbits can help as well.   NUTRITION Eat more plants: colorful vegetables, nuts, seeds and berries.  Eat less sugar, salt, preservatives and processed foods.  Avoid toxins such as cigarettes and alcohol.  Drink water when you are thirsty. Warm water with a slice of lemon is an excellent morning drink to start the day.  Consider these websites for more information The Nutrition Source (https://www.henry-hernandez.biz/) Precision Nutrition (WindowBlog.ch)   RELAXATION Consider practicing mindfulness meditation or other relaxation techniques such as deep breathing, prayer, yoga, tai chi, massage. See website mindful.org or the apps Headspace or Calm to help get  started.   SLEEP Try to get at least 7-8+ hours sleep per day. Regular exercise and reduced caffeine will help you sleep better. Practice good sleep hygeine techniques. See website sleep.org for more information.   PLANNING Prepare estate planning, living will, healthcare POA documents. Sometimes this is best planned with the help of an attorney. Theconversationproject.org and agingwithdignity.org are excellent resources.

## 2016-12-04 ENCOUNTER — Ambulatory Visit
Admission: RE | Admit: 2016-12-04 | Discharge: 2016-12-04 | Disposition: A | Discharge status: Home / Self Care | Payer: Medicare HMO | Source: Ambulatory Visit | Attending: Diagnostic Neuroimaging | Admitting: Diagnostic Neuroimaging

## 2016-12-04 DIAGNOSIS — R259 Unspecified abnormal involuntary movements: Principal | ICD-10-CM

## 2016-12-04 DIAGNOSIS — G252 Other specified forms of tremor: Secondary | ICD-10-CM

## 2016-12-04 DIAGNOSIS — G20 Parkinson's disease: Secondary | ICD-10-CM

## 2016-12-14 ENCOUNTER — Other Ambulatory Visit: Payer: Self-pay | Admitting: *Deleted

## 2016-12-14 ENCOUNTER — Ambulatory Visit: Payer: Medicare HMO | Attending: Diagnostic Neuroimaging | Admitting: Physical Therapy

## 2016-12-14 DIAGNOSIS — R2689 Other abnormalities of gait and mobility: Secondary | ICD-10-CM

## 2016-12-14 DIAGNOSIS — R29818 Other symptoms and signs involving the nervous system: Secondary | ICD-10-CM

## 2016-12-14 DIAGNOSIS — R2681 Unsteadiness on feet: Secondary | ICD-10-CM

## 2016-12-14 DIAGNOSIS — R293 Abnormal posture: Secondary | ICD-10-CM | POA: Diagnosis present

## 2016-12-14 DIAGNOSIS — R251 Tremor, unspecified: Secondary | ICD-10-CM

## 2016-12-14 NOTE — Addendum Note (Signed)
Addended by: Frazier Butt on: 12/14/2016 10:07 AM   Modules accepted: Orders

## 2016-12-14 NOTE — Therapy (Addendum)
[place] [address] [place], [place], [address] Phone: [phone]   Fax:  [phone]  Physical Therapy Evaluation  Patient Details  Name: Fernando Carr MRN: [id number] Date of Birth: 1940-09-03 Referring Provider: Leta Baptist  Encounter Date: 12/14/2016      PT End of Session - 12/14/16 0942    Visit Number 1   Number of Visits 9   Date for PT Re-Evaluation 02/12/17   Authorization Type [employer] GCODE every 10th visit   PT Start Time 0848   PT Stop Time 0928   PT Time Calculation (min) 40 min   Activity Tolerance Patient tolerated treatment well   Behavior During Therapy [facility] for tasks assessed/performed      Past Medical History:  Diagnosis Date  . AAA (abdominal aortic aneurysm) ([place])   . AAA (abdominal aortic aneurysm) ([place])   . Bipolar disorder ([place])   . Depression   . High cholesterol   . Renal insufficiency    chronic renal   . Suicide attempt ([place]) aug. 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 ABDOMINAL AORTIC ANEURYSM REPAIR N/A 07/07/2014   Procedure: ABDOMINAL AORTIC ANEURYSM REPAIR;  Surgeon: Rosetta Posner, MD;  Location: [facility] OR;  Service: Vascular;  Laterality: N/A;  . ABDOMINAL AORTIC ANEURYSM REPAIR  07-07-14   Dr. Donnetta Hutching  . KNEE ARTHROSCOPY Left 1972    There were no vitals filed for this visit.       Subjective Assessment - 12/14/16 0853    Subjective Pt reports having some kind of tremors in hands (R>L), and Dr. Leta Baptist trying to figure out what's going on.  Tremors started within past couple of years.  "He said I walked with a shuffle".  No falls reported.   Patient Stated Goals No specific goals-just here for evaluation today.   Currently in Pain? No/denies            [facility] PT Assessment - 12/14/16 0856      Assessment   Medical Diagnosis Parkinsonism   Referring Provider Penumalli   Onset  Date/Surgical Date 11/23/16  MD visit     Precautions   Precautions Fall     Balance Screen   Has the patient fallen in the past 6 months No   Has the patient had a decrease in activity level because of a fear of falling?  No   Is the patient reluctant to leave their home because of a fear of falling?  No     Home Ecologist residence   Living Arrangements Other (Comment)  Someone to help clean, cook meals   Available Help at Discharge Other (Comment)  see above   Type of [place] to enter   Entrance Stairs-Number of Steps 8   Entrance Stairs-Rails None   Home Layout Two level;Able to live on main level with bedroom/bathroom   Home Equipment None     Prior Function   Level of Independence Independent with basic ADLs;Independent with household mobility without device;Independent with community mobility without device   Vocation Retired   Leisure Picks up granddaughter from school, enjoys watching sports     Posture/Postural Control   Posture/Postural Control Postural limitations   Postural Limitations Rounded Shoulders;Forward head     ROM / Strength   AROM / PROM / Strength Strength     Strength  Overall Strength Comments Grossly tested 4 to 5/5 bilateral lower extremities     Transfers   Transfers Sit to Stand;Stand to Sit   Sit to Stand 6: Modified independent (Device/Increase time);Without upper extremity assist;From chair/3-in-1   Five time sit to stand comments  14.35   Stand to Sit 6: Modified independent (Device/Increase time);Without upper extremity assist;To chair/3-in-1     Ambulation/Gait   Ambulation/Gait Yes   Ambulation/Gait Assistance 6: Modified independent (Device/Increase time)   Ambulation Distance (Feet) 300 Feet   Assistive device None   Gait Pattern Step-through pattern;Decreased arm swing - right;Decreased step length - right;Trendelenburg;Decreased trunk rotation;Trunk flexed;Poor foot  clearance - right   Ambulation Surface Level;Indoor   Gait velocity 11.31 sec = 2.9 ft/sec     Standardized Balance Assessment   Standardized Balance Assessment Timed Up and Go Test     Timed Up and Go Test   Normal TUG (seconds) 13.9   Manual TUG (seconds) 14.09   Cognitive TUG (seconds) 15.43     High Level Balance   High Level Balance Comments MiniBESTest:  21/28     On MiniBESTest:  Unable to maintain balance on compliant surface with EC >6 seconds, takes >2 steps with posterior push and release test, <2 seconds SLS each leg  Mini-BESTest: Balance Evaluation Systems Test  2005-2013 [employer]. All rights reserved. ________________________________________________________________________________________Anticipatory_________Subscore___4__/6 1. SIT TO STAND Instruction: "Cross your arms across your chest. Try not to use your hands unless you must.Do not let your legs lean against the back of the chair when you stand. Please stand up now." X(2) Normal: Comes to stand without use of hands and stabilizes independently. (1) Moderate: Comes to stand WITH use of hands on first attempt. (0) Severe: Unable to stand up from chair without assistance, OR needs several attempts with use of hands. 2. RISE TO TOES Instruction: "Place your feet shoulder width apart. Place your hands on your hips. Try to rise as high as you can onto your toes. I will count out loud to 3 seconds. Try to hold this pose for at least 3 seconds. Look straight ahead. Rise now." X(2) Normal: Stable for 3 s with maximum height. (1) Moderate: Heels up, but not full range (smaller than when holding hands), OR noticeable instability for 3 s. (0) Severe: < 3 s. 3. STAND ON ONE LEG Instruction: "Look straight ahead. Keep your hands on your hips. Lift your leg off of the ground behind you without touching or resting your raised leg upon your other standing leg. Stay standing on one leg as long as you  can. Look straight ahead. Lift now." Left: Time in Seconds Trial 1:_0.75 sec ____Trial 2:__1.53 sec__ (2) Normal: 20 s. (1) Moderate: < 20 s. X(0) Severe: Unable. Right: Time in Seconds Trial 1:_1.5 sec____Trial 2:__0.71 sec___ (2) Normal: 20 s. (1) Moderate: < 20 s. X(0) Severe: Unable To score each side separately use the trial with the longest time. To calculate the sub-score and total score use the side [left or right] with the lowest numerical score [i.e. the worse side]. ______________________________________________________________________________________Reactive Postural Control___________Subscore:__5___/6 4. COMPENSATORY STEPPING CORRECTION- FORWARD Instruction: "Stand with your feet shoulder width apart, arms at your sides. Lean forward against my hands beyond your forward limits. When I let go, do whatever is necessary, including taking a step, to avoid a fall." X(2) Normal: Recovers independently with a single, large step (second realignment step is allowed). (1) Moderate: More than one step used to recover equilibrium. (0)  Severe: No step, OR would fall if not caught, OR falls spontaneously. 5. COMPENSATORY STEPPING CORRECTION- BACKWARD Instruction: "Stand with your feet shoulder width apart, arms at your sides. Lean backward against my hands beyond your backward limits. When I let go, do whatever is necessary, including taking a step, to avoid a fall." (2) Normal: Recovers independently with a single, large step. X(1) Moderate: More than one step used to recover equilibrium. (0) Severe: No step, OR would fall if not caught, OR falls spontaneously. 6. COMPENSATORY STEPPING CORRECTION- LATERAL Instruction: "Stand with your feet together, arms down at your sides. Lean into my hand beyond your sideways limit. When I let go, do whatever is necessary, including taking a step, to avoid a fall." Left X(2) Normal: Recovers independently with 1 step (crossover or lateral OK). (1)  Moderate: Several steps to recover equilibrium. (0) Severe: Falls, or cannot step. Right X2) Normal: Recovers independently with 1 step (crossover or lateral OK). (1) Moderate: Several steps to recover equilibrium. (0) Severe: Falls, or cannot step. Use the side with the lowest score to calculate sub-score and total score. ____________________________________________________________________________________Sensory Orientation_____________Subscore:____5_____/6 7. STANCE (FEET TOGETHER); EYES OPEN, FIRM SURFACE Instruction: "Place your hands on your hips. Place your feet together until almost touching. Look straight ahead. Be as stable and still as possible, until I say stop." Time in seconds:________ X(2) Normal: 30 s. (1) Moderate: < 30 s. (0) Severe: Unable. 8. STANCE (FEET TOGETHER); EYES CLOSED, FOAM SURFACE Instruction: "Step onto the foam. Place your hands on your hips. Place your feet together until almost touching. Be as stable and still as possible, until I say stop. I will start timing when you close your eyes." Time in seconds:___6.75 sec_____ (2) Normal: 30 s. X(1) Moderate: < 30 s. (0) Severe: Unable. 9. INCLINE- EYES CLOSED Instruction: "Step onto the incline ramp. Please stand on the incline ramp with your toes toward the top. Place your feet shoulder width apart and have your arms down at your sides. I will start timing when you close your eyes." Time in seconds:________ X(2) Normal: Stands independently 30 s and aligns with gravity. (1) Moderate: Stands independently <30 s OR aligns with surface. (0) Severe: Unable. _________________________________________________________________________________________Dynamic Gait ______Subscore____7____/10 10. CHANGE IN GAIT SPEED Instruction: "Begin walking at your normal speed, when I tell you 'fast', walk as fast as you can. When I say 'slow', walk very slowly." (2) Normal: Significantly changes walking speed without  imbalance. X(1) Moderate: Unable to change walking speed or signs of imbalance. (0) Severe: Unable to achieve significant change in walking speed AND signs of imbalance. [place] - HORIZONTAL Instruction: "Begin walking at your normal speed, when I say "right", turn your head and look to the right. When I say "left" turn your head and look to the left. Try to keep yourself walking in a straight line." (2) Normal: performs head turns with no change in gait speed and good balance. X(1) Moderate: performs head turns with reduction in gait speed. (0) Severe: performs head turns with imbalance. 12. WALK WITH PIVOT TURNS Instruction: "Begin walking at your normal speed. When I tell you to 'turn and stop', turn as quickly as you can, face the opposite direction, and stop. After the turn, your feet should be close together." X(2) Normal: Turns with feet close FAST (< 3 steps) with good balance. (1) Moderate: Turns with feet close SLOW (>4 steps) with good balance. (0) Severe: Cannot turn with feet close at any speed without imbalance. 13.  STEP OVER OBSTACLES Instruction: "Begin walking at your normal speed. When you get to the box, step over it, not around it and keep walking." X(2) Normal: Able to step over box with minimal change of gait speed and with good balance. (1) Moderate: Steps over box but touches box OR displays cautious behavior by slowing gait. (0) Severe: Unable to step over box OR steps around box. 14. TIMED UP & GO WITH DUAL TASK [3 METER WALK] Instruction TUG: "When I say 'Go', stand up from chair, walk at your normal speed across the tape on the floor, turn around, and come back to sit in the chair." Instruction TUG with Dual Task: "Count backwards by threes starting at ___. When I say 'Go', stand up from chair, walk at your normal speed across the tape on the floor, turn around, and come back to sit in the chair. Continue counting backwards the entire time." TUG:  ___13.9_____seconds; Dual Task TUG: ___15.43_____seconds (2) Normal: No noticeable change in sitting, standing or walking while backward counting when compared to TUG without Dual Task. X(1) Moderate: Dual Task affects either counting OR walking (>10%) when compared to the TUG without Dual Task. (0) Severe: Stops counting while walking OR stops walking while counting. When scoring item 14, if subject's gait speed slows more than 10% between the TUG without and with a Dual Task the score should be decreased by a point. TOTAL SCORE: _____21___/28                            PT Long Term Goals - 12/14/16 0951      PT LONG TERM GOAL #1   Title Pt will be independent with HEP for balance, transfers, and gait.  TARGET 01/13/17   Time 8   Period Weeks   Status New     PT LONG TERM GOAL #2   Title Pt will improve MiniBESTest score to at least 23/38 for decreased fall risk.   Time 8   Period Weeks   Status New     PT LONG TERM GOAL #3   Title Pt will improve TUG and TUG cognitive score to within 10% difference, for improved dual tasking with gait.   Time 8   Period Weeks   Status New     PT LONG TERM GOAL #4   Title Pt will verbalize understanding of fall prevention in home environment.   Time 8   Period Weeks   Status New     PT LONG TERM GOAL #5   Title Pt will improve 5x sit<>stand to less than or equal to 13 seconds for improved efficiency and safety with transfers.   Time 8   Period Weeks   Status New     Additional Long Term Goals   Additional Long Term Goals Yes     PT LONG TERM GOAL #6   Title Pt will verbalize understanding of local Parkinson's disease resources.   Time 8   Period Weeks   Status New               Plan - 12/14/16 0943    Clinical Impression Statement Pt is a 76 year old male with diagnosis of Parkinsonism from recent neurologist visit.  Pt has history of RUE tremor > LUE tremor, some slowing noted in transfers and gait  over the past 2 years.  He is currently undergoing additional testing such as MRI and DatScan, so presentation is  evolving.  PMH includes Bipolar I disorder, AAA, depression.  Pt presents with decreased timing and coordination of gait, decreased functional strength with transfers, decreased balance, abnormal posture, postural instability.  Pt is at fall risk per MiniBESTest, and TUG scores.  Pt demo difficulty with dual tasking with TUG and TUG cognitive scores.  Pt will benefit from skilled PT to address the above stated deficits for improved functional mobility and decreased tremors.  Pt would also benefit from occupational therapy evaluation to specifically address how UE tremors are affecting ADLs and coordination.     Rehab Potential Good   PT Frequency 1x / week   PT Duration 8 weeks  plus eval-recommended 2x/wk for 4 weeks, but pt requests 1x over 8 weeks due to copay   PT Treatment/Interventions ADLs/Self Care Home Management;Functional mobility training;Gait training;Patient/family education;Neuromuscular re-education;Balance training;Therapeutic exercise;Therapeutic activities   PT Next Visit Plan Initiate HEP for standing and for seated PWR! Moves.  Work on Personnel officer for increased step length and arm swing/walking program   Recommended Other Services OT evaluation due to pt's reported difficulty with ADLS and coordination   Consulted and Agree with Plan of Care Patient      Patient will benefit from skilled therapeutic intervention in order to improve the following deficits and impairments:  Abnormal gait, Decreased balance, Decreased mobility, Decreased strength, Difficulty walking, Postural dysfunction  Visit Diagnosis: Other abnormalities of gait and mobility - Plan: PT plan of care cert/re-cert  Abnormal posture - Plan: PT plan of care cert/re-cert  Unsteadiness on feet - Plan: PT plan of care cert/re-cert  Other symptoms and signs involving the nervous system - Plan: PT plan  of care cert/re-cert      G-Codes - 93/81/82 0954    Functional Assessment Tool Used (Outpatient Only) MiniBESTest 21/28; TUG 13.9, 15.43 sec cog; 5x:  14.35 sec    Functional Limitation Mobility: Walking and moving around   Mobility: Walking and Moving Around Current Status [phone]) At least 20 percent but less than 40 percent impaired, limited or restricted   Mobility: Walking and Moving Around Goal Status [phone]) At least 1 percent but less than 20 percent impaired, limited or restricted       Problem List Patient Active Problem List   Diagnosis Date Noted  . MDD (major depressive disorder), recurrent severe, without psychosis ([place]) 08/20/2014  . 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  . Severe depression ([place])   . Major depressive disorder, recurrent, severe without psychotic features ([place])   . AAA (abdominal aortic aneurysm) ([place]) 07/07/2014  . AKI (acute kidney injury) ([place]) 03/10/2014  . Dehydration 03/10/2014  . FTT (failure to thrive) in adult 03/10/2014  . 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 Sinus bradycardia 03/10/2014  . HTN (hypertension) 03/10/2014  . Hypotension, postural 03/09/2014  . CRD (chronic renal disease), stage III 01/09/2014  . Abdominal aortic aneurysm ([place]) 12/20/2011  . Bipolar 1 disorder ([place]) 10/24/2011  . Hyperlipemia 10/10/2011    Jaleen Finch W. 12/14/2016, 10:05 AM [facility] Butt., PT [place] [address] [place], [place], [address] Phone: [phone]   Fax:  [phone]  Name: Fernando Carr MRN: [id number] Date of Birth: May 26, 1941

## 2016-12-21 ENCOUNTER — Ambulatory Visit: Payer: Self-pay | Admitting: Physical Therapy

## 2016-12-23 ENCOUNTER — Ambulatory Visit: Payer: Self-pay | Admitting: Physical Therapy

## 2016-12-28 ENCOUNTER — Ambulatory Visit: Payer: Self-pay | Admitting: Physical Therapy

## 2016-12-29 ENCOUNTER — Telehealth: Payer: Self-pay | Admitting: *Deleted

## 2016-12-29 ENCOUNTER — Ambulatory Visit: Payer: Self-pay | Admitting: Physical Therapy

## 2016-12-29 NOTE — Telephone Encounter (Signed)
Pt returned your call and is asking for you to call back when you are avail

## 2016-12-29 NOTE — Telephone Encounter (Addendum)
Spoke with patient who stated he did not listen to his VM. This RN repeated the message left earlier.  Patient stated he has scan scheduled at [facility]; advised him that is the DAT scan. He inquired what the scan is for. Advised it will further help Dr Leta Baptist to diagnose what is causing his tremors , and hopefully eliminate possible causes.  Patient verbalized understanding, had no other questions.

## 2016-12-29 NOTE — Telephone Encounter (Signed)
LVM informing patient that his MRI brain showed some progression of atrophy and chronic small vessel ischemic disease. Dr Leta Baptist will continue with medical management. Advised he continue medications as ordered. Advised if he has not heard about OT referral within a week, to call back and check on referral. Left number for any questions.

## 2017-01-02 ENCOUNTER — Ambulatory Visit: Payer: Self-pay | Admitting: Physical Therapy

## 2017-01-04 DIAGNOSIS — R9402 Abnormal brain scan: Secondary | ICD-10-CM | POA: Diagnosis not present

## 2017-01-04 DIAGNOSIS — R251 Tremor, unspecified: Secondary | ICD-10-CM | POA: Diagnosis not present

## 2017-01-04 DIAGNOSIS — G20 Parkinson's disease: Secondary | ICD-10-CM | POA: Diagnosis not present

## 2017-01-06 ENCOUNTER — Ambulatory Visit: Payer: Self-pay | Admitting: Physical Therapy

## 2017-01-11 ENCOUNTER — Ambulatory Visit: Payer: Self-pay | Admitting: Physical Therapy

## 2017-01-12 ENCOUNTER — Telehealth: Payer: Self-pay | Admitting: *Deleted

## 2017-01-12 NOTE — Telephone Encounter (Signed)
Per Dr Leta Baptist, called [facility] to inquire about patient having repeat DAT scan with no charge. Previous DAT scan conclusion: technically limited study, cannot rule out or confirm Parkinson's disease or related syndromes. Dr Leta Baptist will repeat study if patient is not charged for repeat study. Person that this RN needed to speak to was not available. Staff stated he would call back; she got this RN's name and number for call back either today or tomorrow.

## 2017-01-16 NOTE — Telephone Encounter (Addendum)
Received call back from [facility] Nuclear med. She stated that the head of dept stated the DAT scan would be repeated at no charge. Diane asked that this RN call the patient, and she will call him this afternoon to schedule.  Called patient and explained that DAT scan result was inconclusive. Advised him that [facility] has agreed to repeat the scan at no cost to him per Shauna Hugh, Nuclear Med dept. Advised him Diane will call him this afternoon to schedule DAT scan. This RN apologized for his inconvenience.  Patient verbalized understanding, appreciation.

## 2017-01-18 ENCOUNTER — Ambulatory Visit: Payer: Self-pay | Admitting: Physical Therapy

## 2017-01-25 ENCOUNTER — Ambulatory Visit: Payer: Self-pay | Admitting: Physical Therapy

## 2017-01-27 ENCOUNTER — Encounter: Payer: Self-pay | Admitting: Physical Therapy

## 2017-01-27 NOTE — Therapy (Signed)
[place] [address] [place], [place], [address] Phone: [phone]   Fax:  [phone]  Patient Details  Name: BREES HOUNSHELL MRN: [id number] Date of Birth: [birth year] Referring Provider:  No ref. provider found  Encounter Date: 02-13-2017   PHYSICAL THERAPY DISCHARGE SUMMARY  Visits from Start of Care: 1  Current functional level related to goals / functional outcomes: See eval-pt did not return after eval, cancelling remaining appointments due to financial concerns.   Remaining deficits: See eval   Education / Equipment: Unable to complete-due to pt not returning.  Did recommend OT eval, and OT order was received.  Plan: Patient agrees to discharge.  Patient goals were not met. Patient is being discharged due to not returning since the last visit.  ?????        G-Codes - February 13, 2017 1238    Functional Assessment Tool Used (Outpatient Only) Pt did not return from eval   Functional Limitation Mobility: Walking and moving around   Mobility: Walking and Moving Around Goal Status [phone]) At least 1 percent but less than 20 percent impaired, limited or restricted   Mobility: Walking and Moving Around Discharge Status [phone]) At least 20 percent but less than 40 percent impaired, limited or restricted       MARRIOTT,AMY W. 02-13-2017, 12:39 PM  [place] [address] [place], [place], [address] Phone: [phone]   Fax:  [phone]         Mady Haagensen, PT February 13, 2017 12:40 PM Phone: [phone] Fax: [phone]

## 2017-01-31 ENCOUNTER — Ambulatory Visit: Payer: Self-pay | Admitting: Physical Therapy

## 2017-01-31 ENCOUNTER — Telehealth: Payer: Self-pay | Admitting: *Deleted

## 2017-01-31 NOTE — Telephone Encounter (Signed)
Pt called back to inform he has an appointment to redo, he thinks on the 11th of July at [facility], please call

## 2017-01-31 NOTE — Telephone Encounter (Signed)
LVM informing patient this RN got his message and that Mina Marble will fax Dr Leta Baptist the report when ready. Advised he does not need to call back unless he has questions. Left number.

## 2017-01-31 NOTE — Telephone Encounter (Signed)
LVM advising patient this RN was checking on DAT scan that was to be repeated at [facility]. Requested he call back to inform of the status of scan.

## 2017-02-07 ENCOUNTER — Encounter (HOSPITAL_COMMUNITY): Payer: Self-pay | Admitting: Psychiatry

## 2017-02-07 ENCOUNTER — Ambulatory Visit (INDEPENDENT_AMBULATORY_CARE_PROVIDER_SITE_OTHER): Payer: Medicare HMO | Admitting: Psychiatry

## 2017-02-07 DIAGNOSIS — R69 Illness, unspecified: Secondary | ICD-10-CM | POA: Diagnosis not present

## 2017-02-07 DIAGNOSIS — Z811 Family history of alcohol abuse and dependence: Secondary | ICD-10-CM

## 2017-02-07 DIAGNOSIS — F319 Bipolar disorder, unspecified: Secondary | ICD-10-CM | POA: Diagnosis not present

## 2017-02-07 DIAGNOSIS — Z87891 Personal history of nicotine dependence: Secondary | ICD-10-CM | POA: Diagnosis not present

## 2017-02-07 DIAGNOSIS — Z818 Family history of other mental and behavioral disorders: Secondary | ICD-10-CM

## 2017-02-07 MED ORDER — RISPERIDONE 0.25 MG PO TABS: ORAL_TABLET | ORAL | 0 refills | Status: DC

## 2017-02-07 MED ORDER — SERTRALINE HCL 100 MG PO TABS: ORAL_TABLET | ORAL | 0 refills | Status: DC
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
## 2017-02-14 DIAGNOSIS — R251 Tremor, unspecified: Secondary | ICD-10-CM | POA: Diagnosis not present

## 2017-02-16 NOTE — Telephone Encounter (Signed)
LVM informing patient that Dr Leta Baptist received results from his second DAT scan. Advised him, per Dr Leta Baptist that he may have some Parkinson's disease features., but no changes in his medications at this time. Advised him Dr  Leta Baptist will discuss scan with him further at his follow up on 02/28/17. Left number for questions.

## 2017-02-21 DIAGNOSIS — Z1389 Encounter for screening for other disorder: Secondary | ICD-10-CM | POA: Diagnosis not present

## 2017-02-21 DIAGNOSIS — Z0000 Encounter for general adult medical examination without abnormal findings: Secondary | ICD-10-CM | POA: Diagnosis not present

## 2017-02-28 ENCOUNTER — Ambulatory Visit (INDEPENDENT_AMBULATORY_CARE_PROVIDER_SITE_OTHER): Payer: Medicare HMO | Admitting: Diagnostic Neuroimaging

## 2017-02-28 ENCOUNTER — Encounter: Payer: Self-pay | Admitting: Diagnostic Neuroimaging

## 2017-02-28 VITALS — BP 139/79 | HR 62 | Ht 72.0 in | Wt 230.0 lb

## 2017-02-28 DIAGNOSIS — R259 Unspecified abnormal involuntary movements: Secondary | ICD-10-CM

## 2017-02-28 DIAGNOSIS — G20 Parkinson's disease: Secondary | ICD-10-CM

## 2017-02-28 DIAGNOSIS — G252 Other specified forms of tremor: Secondary | ICD-10-CM

## 2017-02-28 MED ORDER — CARBIDOPA-LEVODOPA 25-100 MG PO TABS: 1.0000 | ORAL_TABLET | Freq: Three times a day (TID) | ORAL | 6 refills | Status: DC

## 2017-02-28 NOTE — Patient Instructions (Signed)
  PARKINSON'S DISEASE - trial of carb/levo (25/100) --> half tab three times per day with meals x 2 weeks; then 1 full tab three times per day - review community resources
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
## 2017-03-06 DIAGNOSIS — R69 Illness, unspecified: Secondary | ICD-10-CM | POA: Diagnosis not present

## 2017-03-06 DIAGNOSIS — E782 Mixed hyperlipidemia: Secondary | ICD-10-CM | POA: Diagnosis not present

## 2017-03-06 DIAGNOSIS — N183 Chronic kidney disease, stage 3 (moderate): Secondary | ICD-10-CM | POA: Diagnosis not present

## 2017-03-06 DIAGNOSIS — R7301 Impaired fasting glucose: Secondary | ICD-10-CM | POA: Diagnosis not present

## 2017-03-06 DIAGNOSIS — G20 Parkinson's disease: Secondary | ICD-10-CM | POA: Diagnosis not present

## 2017-03-06 DIAGNOSIS — R259 Unspecified abnormal involuntary movements: Secondary | ICD-10-CM | POA: Diagnosis not present

## 2017-03-06 DIAGNOSIS — I739 Peripheral vascular disease, unspecified: Secondary | ICD-10-CM | POA: Diagnosis not present

## 2017-05-06 ENCOUNTER — Other Ambulatory Visit (HOSPITAL_COMMUNITY): Payer: Self-pay | Admitting: Psychiatry

## 2017-05-06 DIAGNOSIS — F319 Bipolar disorder, unspecified: Secondary | ICD-10-CM

## 2017-05-10 ENCOUNTER — Ambulatory Visit (INDEPENDENT_AMBULATORY_CARE_PROVIDER_SITE_OTHER): Payer: Medicare HMO | Admitting: Psychiatry

## 2017-05-10 ENCOUNTER — Encounter (HOSPITAL_COMMUNITY): Payer: Self-pay | Admitting: Psychiatry

## 2017-05-10 ENCOUNTER — Other Ambulatory Visit (HOSPITAL_COMMUNITY): Payer: Self-pay | Admitting: Psychiatry

## 2017-05-10 DIAGNOSIS — G20 Parkinson's disease: Secondary | ICD-10-CM

## 2017-05-10 DIAGNOSIS — R69 Illness, unspecified: Secondary | ICD-10-CM | POA: Diagnosis not present

## 2017-05-10 DIAGNOSIS — Z811 Family history of alcohol abuse and dependence: Secondary | ICD-10-CM | POA: Diagnosis not present

## 2017-05-10 DIAGNOSIS — F319 Bipolar disorder, unspecified: Secondary | ICD-10-CM | POA: Diagnosis not present

## 2017-05-10 DIAGNOSIS — Z87891 Personal history of nicotine dependence: Secondary | ICD-10-CM | POA: Diagnosis not present

## 2017-05-10 DIAGNOSIS — Z818 Family history of other mental and behavioral disorders: Secondary | ICD-10-CM | POA: Diagnosis not present

## 2017-05-10 MED ORDER — RISPERIDONE 0.25 MG PO TABS
ORAL_TABLET | ORAL | 0 refills | Status: DC
Start: 2017-05-10 — End: 2017-08-10

## 2017-05-10 MED ORDER — SERTRALINE HCL 100 MG PO TABS: ORAL_TABLET | ORAL | 0 refills | Status: DC
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
## 2017-05-26 DIAGNOSIS — Z23 Encounter for immunization: Secondary | ICD-10-CM | POA: Diagnosis not present

## 2017-07-04 ENCOUNTER — Encounter: Payer: Self-pay | Admitting: Diagnostic Neuroimaging

## 2017-07-04 ENCOUNTER — Ambulatory Visit (INDEPENDENT_AMBULATORY_CARE_PROVIDER_SITE_OTHER): Payer: Medicare HMO | Admitting: Diagnostic Neuroimaging

## 2017-07-04 VITALS — BP 144/73 | HR 60 | Wt 236.4 lb

## 2017-07-04 DIAGNOSIS — G20 Parkinson's disease: Secondary | ICD-10-CM | POA: Diagnosis not present

## 2017-07-04 MED ORDER — CARBIDOPA-LEVODOPA 25-100 MG PO TABS: 1.0000 | ORAL_TABLET | Freq: Three times a day (TID) | ORAL | 12 refills | Status: DC
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
## 2017-08-10 ENCOUNTER — Ambulatory Visit (INDEPENDENT_AMBULATORY_CARE_PROVIDER_SITE_OTHER): Payer: Medicare HMO | Admitting: Psychiatry

## 2017-08-10 ENCOUNTER — Encounter (HOSPITAL_COMMUNITY): Payer: Self-pay | Admitting: Psychiatry

## 2017-08-10 DIAGNOSIS — Z87891 Personal history of nicotine dependence: Secondary | ICD-10-CM | POA: Diagnosis not present

## 2017-08-10 DIAGNOSIS — F411 Generalized anxiety disorder: Secondary | ICD-10-CM | POA: Diagnosis not present

## 2017-08-10 DIAGNOSIS — Z811 Family history of alcohol abuse and dependence: Secondary | ICD-10-CM

## 2017-08-10 DIAGNOSIS — F319 Bipolar disorder, unspecified: Secondary | ICD-10-CM | POA: Diagnosis not present

## 2017-08-10 DIAGNOSIS — Z818 Family history of other mental and behavioral disorders: Secondary | ICD-10-CM | POA: Diagnosis not present

## 2017-08-10 DIAGNOSIS — R69 Illness, unspecified: Secondary | ICD-10-CM | POA: Diagnosis not present

## 2017-08-10 MED ORDER — SERTRALINE HCL 100 MG PO TABS: ORAL_TABLET | ORAL | 0 refills | Status: DC

## 2017-08-10 MED ORDER — RISPERIDONE 0.25 MG PO TABS
ORAL_TABLET | ORAL | 0 refills | Status: DC
Start: 2017-08-10 — End: 2017-11-08
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
## 2017-11-02 DIAGNOSIS — H353131 Nonexudative age-related macular degeneration, bilateral, early dry stage: Secondary | ICD-10-CM | POA: Diagnosis not present

## 2017-11-02 DIAGNOSIS — H26493 Other secondary cataract, bilateral: Secondary | ICD-10-CM | POA: Diagnosis not present

## 2017-11-02 DIAGNOSIS — H35033 Hypertensive retinopathy, bilateral: Secondary | ICD-10-CM | POA: Diagnosis not present

## 2017-11-02 DIAGNOSIS — Z961 Presence of intraocular lens: Secondary | ICD-10-CM | POA: Diagnosis not present

## 2017-11-08 ENCOUNTER — Encounter (HOSPITAL_COMMUNITY): Payer: Self-pay | Admitting: Psychiatry

## 2017-11-08 ENCOUNTER — Ambulatory Visit (INDEPENDENT_AMBULATORY_CARE_PROVIDER_SITE_OTHER): Payer: Medicare HMO | Admitting: Psychiatry

## 2017-11-08 DIAGNOSIS — F411 Generalized anxiety disorder: Secondary | ICD-10-CM

## 2017-11-08 DIAGNOSIS — F319 Bipolar disorder, unspecified: Secondary | ICD-10-CM

## 2017-11-08 DIAGNOSIS — Z811 Family history of alcohol abuse and dependence: Secondary | ICD-10-CM | POA: Diagnosis not present

## 2017-11-08 DIAGNOSIS — Z818 Family history of other mental and behavioral disorders: Secondary | ICD-10-CM

## 2017-11-08 DIAGNOSIS — Z87891 Personal history of nicotine dependence: Secondary | ICD-10-CM

## 2017-11-08 DIAGNOSIS — R69 Illness, unspecified: Secondary | ICD-10-CM | POA: Diagnosis not present

## 2017-11-08 MED ORDER — SERTRALINE HCL 100 MG PO TABS: ORAL_TABLET | ORAL | 0 refills | Status: DC

## 2017-11-08 MED ORDER — RISPERIDONE 0.25 MG PO TABS: ORAL_TABLET | ORAL | 0 refills | Status: DC
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
## 2017-12-14 DIAGNOSIS — H26492 Other secondary cataract, left eye: Secondary | ICD-10-CM | POA: Diagnosis not present

## 2017-12-15 DIAGNOSIS — R69 Illness, unspecified: Secondary | ICD-10-CM | POA: Diagnosis not present

## 2017-12-15 DIAGNOSIS — E669 Obesity, unspecified: Secondary | ICD-10-CM | POA: Diagnosis not present

## 2017-12-15 DIAGNOSIS — I951 Orthostatic hypotension: Secondary | ICD-10-CM | POA: Diagnosis not present

## 2017-12-15 DIAGNOSIS — E785 Hyperlipidemia, unspecified: Secondary | ICD-10-CM | POA: Diagnosis not present

## 2017-12-15 DIAGNOSIS — G20 Parkinson's disease: Secondary | ICD-10-CM | POA: Diagnosis not present

## 2017-12-15 DIAGNOSIS — R030 Elevated blood-pressure reading, without diagnosis of hypertension: Secondary | ICD-10-CM | POA: Diagnosis not present

## 2017-12-15 DIAGNOSIS — Z6831 Body mass index (BMI) 31.0-31.9, adult: Secondary | ICD-10-CM | POA: Diagnosis not present

## 2017-12-15 DIAGNOSIS — Z720 Tobacco use: Secondary | ICD-10-CM | POA: Diagnosis not present

## 2017-12-15 DIAGNOSIS — K08409 Partial loss of teeth, unspecified cause, unspecified class: Secondary | ICD-10-CM | POA: Diagnosis not present

## 2018-01-04 DIAGNOSIS — H26491 Other secondary cataract, right eye: Secondary | ICD-10-CM | POA: Diagnosis not present

## 2018-01-09 ENCOUNTER — Ambulatory Visit (HOSPITAL_COMMUNITY): Payer: Medicare HMO | Admitting: Psychiatry

## 2018-01-11 ENCOUNTER — Ambulatory Visit (INDEPENDENT_AMBULATORY_CARE_PROVIDER_SITE_OTHER): Payer: Medicare HMO | Admitting: Psychiatry

## 2018-01-11 ENCOUNTER — Encounter (HOSPITAL_COMMUNITY): Payer: Self-pay | Admitting: Psychiatry

## 2018-01-11 DIAGNOSIS — Z811 Family history of alcohol abuse and dependence: Secondary | ICD-10-CM

## 2018-01-11 DIAGNOSIS — Z813 Family history of other psychoactive substance abuse and dependence: Secondary | ICD-10-CM

## 2018-01-11 DIAGNOSIS — Z79899 Other long term (current) drug therapy: Secondary | ICD-10-CM

## 2018-01-11 DIAGNOSIS — F319 Bipolar disorder, unspecified: Secondary | ICD-10-CM | POA: Diagnosis not present

## 2018-01-11 DIAGNOSIS — F411 Generalized anxiety disorder: Secondary | ICD-10-CM

## 2018-01-11 DIAGNOSIS — Z915 Personal history of self-harm: Secondary | ICD-10-CM | POA: Diagnosis not present

## 2018-01-11 DIAGNOSIS — R69 Illness, unspecified: Secondary | ICD-10-CM | POA: Diagnosis not present

## 2018-01-11 DIAGNOSIS — G20 Parkinson's disease: Secondary | ICD-10-CM

## 2018-01-11 DIAGNOSIS — Z818 Family history of other mental and behavioral disorders: Secondary | ICD-10-CM

## 2018-01-11 MED ORDER — SERTRALINE HCL 100 MG PO TABS: ORAL_TABLET | ORAL | 0 refills | Status: DC

## 2018-01-11 MED ORDER — RISPERIDONE 0.25 MG PO TABS: ORAL_TABLET | ORAL | 0 refills | Status: DC

## 2018-01-11 NOTE — Progress Notes (Signed)
[facility] [facility] MD/PA/NP OP Progress Note  01/11/2018 2:12 PM Fernando Carr  MRN:  [id number]  Chief Complaint: I am doing good on my medication.  I am sleeping good.  HPI: Fernando Carr came for his follow-up appointment.  He is taking his medication as prescribed.  He admitted weight gain in past 3 months because he is not watching his calorie intake.  Overall he describes his mood is a stable.  He denies any irritability, anger, mania or any psychosis.  His daughter and 3 kids are living with him and he is handling them very well.  Patient has tremors in his hand.  He has not seen neurology and I reminded him that he should schedule appointment for the management of Parkinson.  He is also scheduled to see his primary care physician in coming weeks.  His energy level is fair.  Patient denies drinking alcohol or using any illegal substances.  His energy level is fair.  He denies any hallucination, paranoia or any suicidal thoughts.  Visit Diagnosis:    ICD-10-CM   1. Bipolar 1 disorder (HCC) F31.9 sertraline (ZOLOFT) 100 MG tablet    risperiDONE (RISPERDAL) 0.25 MG tablet    Past Psychiatric History: Reviewed Patient was admitted in thousand 6 due to suicidal attempt and 2015 due to suicidal thoughts. He injected Ethyline Glycol. 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 He suffered significant loss including his property and he was feeling hopeless helpless and having paranoid thoughts. In the past he had tried Abilify and Lexapro. However he had a good response with Risperdal.   Past Medical History:  Past Medical History:  Diagnosis Date  . AAA (abdominal aortic aneurysm) ([place])   . AAA (abdominal aortic aneurysm) ([place])   . Bipolar disorder ([place])   . Depression   . High cholesterol   . Renal insufficiency    chronic renal   . Resting tremor   . Suicide attempt ([place]) aug. 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 ABDOMINAL AORTIC ANEURYSM REPAIR N/A 07/07/2014   Procedure: ABDOMINAL AORTIC ANEURYSM REPAIR;  Surgeon: Rosetta Posner, MD;  Location: [facility] OR;  Service: Vascular;  Laterality: N/A;  . ABDOMINAL AORTIC ANEURYSM REPAIR  07-07-14   Dr. Donnetta Hutching  . KNEE ARTHROSCOPY Left 1972    Family Psychiatric History: Reviewed per  Family History:  Family History  Problem Relation Age of Onset  . Alcohol abuse Mother   . Alcohol abuse Father   . Suicidality Paternal Uncle   . Suicidality Cousin   . Depression Daughter     Social History:  Social History   Socioeconomic History  . Marital status: Single    Spouse name: Not on file  . Number of children: Not on file  . Years of education: Not on file  . Highest education level: Not on file  Occupational History  . Not on file  Social Needs  . Financial resource strain: Not on file  . Food insecurity:    Worry: Not on file    Inability: Not on file  . Transportation needs:    Medical: Not on file    Non-medical: Not on file  Tobacco Use  . Smoking status: Former Smoker    Packs/day: 1.00    Years: 40.00    Pack years: 40.00    Types: Cigarettes    Last attempt to quit: 03/11/2014  Years since quitting: 3.8  . Smokeless tobacco: Never Used  . Tobacco comment: Smokes a couple a day sometimes  Substance and Sexual Activity  . Alcohol use: No    Alcohol/week: 0.0 oz  . Drug use: No  . Sexual activity: Never  Lifestyle  . Physical activity:    Days per week: Not on file    Minutes per session: Not on file  . Stress: Not on file  Relationships  . Social connections:    Talks on phone: Not on file    Gets together: Not on file    Attends religious service: Not on file    Active member of club or organization: Not on file    Attends meetings of clubs or organizations: Not on file    Relationship status: Not on file  Other Topics Concern  . Not on file  Social History Narrative   Lives at home alone.  07/04/17  dgtr and children with me temporarily.    Has one child.     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     Caffeine 20 oz daily.     Allergies: No Known Allergies  Metabolic Disorder Labs: Lab Results  Component Value Date   HGBA1C 5.6 07/20/2012   MPG 114 07/20/2012   MPG 120 (H) 10/21/2010   No results found for: PROLACTIN No results found for: CHOL, TRIG, HDL, CHOLHDL, VLDL, LDLCALC Lab Results  Component Value Date   TSH 0.321 (L) 08/21/2014   TSH 0.301 (L) 03/10/2014    Therapeutic Level Labs: No results found for: LITHIUM No results found for: VALPROATE No components found for:  CBMZ  Current Medications: Current Outpatient Medications  Medication Sig Dispense Refill  . carbidopa-levodopa (SINEMET IR) 25-100 MG tablet Take 1 tablet by mouth 3 (three) times daily before meals. 90 tablet 12  . fenofibrate 54 MG tablet Reported on 08/11/2015    . risperiDONE (RISPERDAL) 0.25 MG tablet TAKE 1 TABLET AT BEDTIME FOR MOOD STABILIZATION 90 tablet 0  . sertraline (ZOLOFT) 100 MG tablet TAKE 1 TABLET DAILY FOR DEPRESSION 90 tablet 0  . simvastatin (ZOCOR) 10 MG tablet Take 1 tablet (10 mg total) by mouth daily at 6 PM.     No current facility-administered medications for this visit.      Musculoskeletal: Strength & Muscle Tone: within normal limits Gait & Station: unsteady, shuffle Patient leans: N/A  Psychiatric Specialty Exam: ROS  Blood pressure 125/65, pulse 71, height 6' (1.829 m), weight 248 lb 9.6 oz (112.8 kg), SpO2 95 %.Body mass index is 33.72 kg/m.  General Appearance: Casual  Eye Contact:  Good  Speech:  Slow  Volume:  Normal  Mood:  Euthymic  Affect:  Congruent  Thought Process:  Goal Directed  Orientation:  Full (Time, Place, and Person)  Thought Content: Logical   Suicidal Thoughts:  No  Homicidal Thoughts:  No  Memory:  Immediate;   Fair Recent;   Fair Remote;   Fair  Judgement:  Good  Insight:  Good  Psychomotor Activity:  Tremor   Concentration:  Concentration: Fair and Attention Span: Fair  Recall:  Good  Fund of Knowledge: Good  Language: Good  Akathisia:  No  Handed:  Right  AIMS (if indicated): not done  Assets:  Communication Skills Desire for [place] Talents/Skills  ADL's:  Intact  Cognition: WNL  Sleep:  Good   Screenings: AUDIT     Admission (Discharged) from 08/19/2014 in [facility]  Alcohol Use Disorder Identification Test Final Score (AUDIT)  0       Assessment and Plan: Bipolar disorder type I.  Generalized anxiety disorder.  Patient doing better and he is stable on his current medication.  He is not interested in counseling.  Encouraged to see his neurology for management of Parkinson.  Continue Zoloft 100 mg daily and Risperdal 0.25 mg at bedtime.  Discussed medication side effects and benefits.  Recommended to call us back if is any question or any concern.  Follow-up in 3 months.   Kathlee Nations, MD 01/11/2018, 2:12 PM

## 2018-03-07 DIAGNOSIS — G20 Parkinson's disease: Secondary | ICD-10-CM | POA: Diagnosis not present

## 2018-03-07 DIAGNOSIS — Z125 Encounter for screening for malignant neoplasm of prostate: Secondary | ICD-10-CM | POA: Diagnosis not present

## 2018-03-07 DIAGNOSIS — I739 Peripheral vascular disease, unspecified: Secondary | ICD-10-CM | POA: Diagnosis not present

## 2018-03-07 DIAGNOSIS — E782 Mixed hyperlipidemia: Secondary | ICD-10-CM | POA: Diagnosis not present

## 2018-03-07 DIAGNOSIS — Z0000 Encounter for general adult medical examination without abnormal findings: Secondary | ICD-10-CM | POA: Diagnosis not present

## 2018-03-07 DIAGNOSIS — R7309 Other abnormal glucose: Secondary | ICD-10-CM | POA: Diagnosis not present

## 2018-03-07 DIAGNOSIS — Z1389 Encounter for screening for other disorder: Secondary | ICD-10-CM | POA: Diagnosis not present

## 2018-03-07 DIAGNOSIS — R69 Illness, unspecified: Secondary | ICD-10-CM | POA: Diagnosis not present

## 2018-03-07 DIAGNOSIS — N183 Chronic kidney disease, stage 3 (moderate): Secondary | ICD-10-CM | POA: Diagnosis not present

## 2018-04-03 ENCOUNTER — Ambulatory Visit (INDEPENDENT_AMBULATORY_CARE_PROVIDER_SITE_OTHER): Payer: Medicare HMO | Admitting: Diagnostic Neuroimaging

## 2018-04-03 ENCOUNTER — Encounter: Payer: Self-pay | Admitting: Diagnostic Neuroimaging

## 2018-04-03 VITALS — BP 114/61 | HR 58 | Ht 72.0 in | Wt 247.4 lb

## 2018-04-03 DIAGNOSIS — G20 Parkinson's disease: Secondary | ICD-10-CM

## 2018-04-03 MED ORDER — CARBIDOPA-LEVODOPA 25-100 MG PO TABS: 1.0000 | ORAL_TABLET | Freq: Three times a day (TID) | ORAL | 4 refills | Status: DC
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
## 2018-04-13 ENCOUNTER — Ambulatory Visit (HOSPITAL_COMMUNITY): Payer: Self-pay | Admitting: Psychiatry

## 2018-04-16 DIAGNOSIS — Z23 Encounter for immunization: Secondary | ICD-10-CM | POA: Diagnosis not present

## 2018-04-30 ENCOUNTER — Other Ambulatory Visit (HOSPITAL_COMMUNITY): Payer: Self-pay | Admitting: Psychiatry

## 2018-04-30 DIAGNOSIS — F319 Bipolar disorder, unspecified: Secondary | ICD-10-CM

## 2018-05-09 ENCOUNTER — Other Ambulatory Visit (HOSPITAL_COMMUNITY): Payer: Self-pay

## 2018-05-09 DIAGNOSIS — F319 Bipolar disorder, unspecified: Secondary | ICD-10-CM

## 2018-05-09 MED ORDER — RISPERIDONE 0.25 MG PO TABS: ORAL_TABLET | ORAL | 0 refills | Status: DC

## 2018-05-09 MED ORDER — SERTRALINE HCL 100 MG PO TABS: ORAL_TABLET | ORAL | 0 refills | Status: DC

## 2018-06-07 ENCOUNTER — Ambulatory Visit (HOSPITAL_COMMUNITY): Payer: Self-pay | Admitting: Psychiatry

## 2018-06-07 ENCOUNTER — Ambulatory Visit (INDEPENDENT_AMBULATORY_CARE_PROVIDER_SITE_OTHER): Payer: Medicare HMO | Admitting: Psychiatry

## 2018-06-07 ENCOUNTER — Encounter (HOSPITAL_COMMUNITY): Payer: Self-pay | Admitting: Psychiatry

## 2018-06-07 VITALS — BP 132/80 | Ht 72.0 in | Wt 259.0 lb

## 2018-06-07 DIAGNOSIS — F319 Bipolar disorder, unspecified: Secondary | ICD-10-CM

## 2018-06-07 DIAGNOSIS — F411 Generalized anxiety disorder: Secondary | ICD-10-CM

## 2018-06-07 DIAGNOSIS — R69 Illness, unspecified: Secondary | ICD-10-CM | POA: Diagnosis not present

## 2018-06-07 MED ORDER — SERTRALINE HCL 100 MG PO TABS: ORAL_TABLET | ORAL | 0 refills | Status: DC

## 2018-06-07 MED ORDER — RISPERIDONE 0.25 MG PO TABS: ORAL_TABLET | ORAL | 0 refills | Status: DC
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
## 2018-07-13 DIAGNOSIS — H35033 Hypertensive retinopathy, bilateral: Secondary | ICD-10-CM | POA: Diagnosis not present

## 2018-07-13 DIAGNOSIS — H353131 Nonexudative age-related macular degeneration, bilateral, early dry stage: Secondary | ICD-10-CM | POA: Diagnosis not present

## 2018-07-13 DIAGNOSIS — H43393 Other vitreous opacities, bilateral: Secondary | ICD-10-CM | POA: Diagnosis not present

## 2018-07-13 DIAGNOSIS — Z961 Presence of intraocular lens: Secondary | ICD-10-CM | POA: Diagnosis not present

## 2018-08-31 ENCOUNTER — Other Ambulatory Visit (HOSPITAL_COMMUNITY): Payer: Self-pay | Admitting: Psychiatry

## 2018-08-31 DIAGNOSIS — F319 Bipolar disorder, unspecified: Secondary | ICD-10-CM

## 2018-08-31 DIAGNOSIS — F411 Generalized anxiety disorder: Secondary | ICD-10-CM

## 2018-09-10 DIAGNOSIS — R69 Illness, unspecified: Secondary | ICD-10-CM | POA: Diagnosis not present

## 2018-09-10 DIAGNOSIS — Z6836 Body mass index (BMI) 36.0-36.9, adult: Secondary | ICD-10-CM | POA: Diagnosis not present

## 2018-09-10 DIAGNOSIS — R7303 Prediabetes: Secondary | ICD-10-CM | POA: Diagnosis not present

## 2018-09-10 DIAGNOSIS — N183 Chronic kidney disease, stage 3 (moderate): Secondary | ICD-10-CM | POA: Diagnosis not present

## 2018-09-10 DIAGNOSIS — Z9889 Other specified postprocedural states: Secondary | ICD-10-CM | POA: Diagnosis not present

## 2018-09-10 DIAGNOSIS — I739 Peripheral vascular disease, unspecified: Secondary | ICD-10-CM | POA: Diagnosis not present

## 2018-09-10 DIAGNOSIS — E782 Mixed hyperlipidemia: Secondary | ICD-10-CM | POA: Diagnosis not present

## 2018-09-10 DIAGNOSIS — G20 Parkinson's disease: Secondary | ICD-10-CM | POA: Diagnosis not present

## 2018-10-06 ENCOUNTER — Other Ambulatory Visit (HOSPITAL_COMMUNITY): Payer: Self-pay | Admitting: Psychiatry

## 2018-10-06 DIAGNOSIS — F411 Generalized anxiety disorder: Secondary | ICD-10-CM

## 2018-10-06 DIAGNOSIS — F319 Bipolar disorder, unspecified: Secondary | ICD-10-CM

## 2018-10-24 ENCOUNTER — Telehealth (HOSPITAL_COMMUNITY): Payer: Self-pay

## 2018-10-24 ENCOUNTER — Other Ambulatory Visit (HOSPITAL_COMMUNITY): Payer: Self-pay | Admitting: Psychiatry

## 2018-10-24 DIAGNOSIS — F319 Bipolar disorder, unspecified: Secondary | ICD-10-CM

## 2018-10-24 DIAGNOSIS — F411 Generalized anxiety disorder: Secondary | ICD-10-CM

## 2018-10-24 MED ORDER — SERTRALINE HCL 100 MG PO TABS: ORAL_TABLET | ORAL | 0 refills | Status: DC

## 2018-10-24 MED ORDER — RISPERIDONE 0.25 MG PO TABS: ORAL_TABLET | ORAL | 0 refills | Status: DC

## 2018-10-24 NOTE — Telephone Encounter (Signed)
Medication refill request - Fax from [facility] on [facility] requesting a new 90 day order for pt's Risperidone, last ordered 06/17/18 and pt returns next on 12/04/18.

## 2018-10-24 NOTE — Telephone Encounter (Signed)
done

## 2018-11-13 ENCOUNTER — Telehealth (HOSPITAL_COMMUNITY): Payer: Self-pay

## 2018-11-13 NOTE — Telephone Encounter (Signed)
Call return.  Patient admitted that he is not sleeping well and feeling hyper.  He endorses family is getting along and it is giving him a lot of excitement.  He does not want to get into mania.  I recommend to try Risperdal 0.5 mg and take 2 tablets of 0.25 to help his sleep and hyper feeling.  He used to take higher dose of Risperdal.  I explained side effects and postural hypotension.  He has enough Risperdal that if he take 2 tablet of 0.25 mg will last until his next appointment on May 5.  I recommended if that does not help then he can call us back.

## 2018-11-13 NOTE — Telephone Encounter (Signed)
Patient called and states that he needs you to call him, he needs attention. Patient did not further explain, just wants you to call

## 2018-11-22 ENCOUNTER — Telehealth: Payer: Self-pay | Admitting: Diagnostic Neuroimaging

## 2018-11-22 NOTE — Telephone Encounter (Signed)
Pt called in wanting to know what should her expect with having Parkinsons. He wants to know if it changes his mood and makes him think slowly
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
## 2018-11-23 ENCOUNTER — Other Ambulatory Visit: Payer: Self-pay

## 2018-11-23 ENCOUNTER — Ambulatory Visit (INDEPENDENT_AMBULATORY_CARE_PROVIDER_SITE_OTHER): Payer: Medicare HMO | Admitting: Psychiatry

## 2018-11-23 ENCOUNTER — Encounter (HOSPITAL_COMMUNITY): Payer: Self-pay | Admitting: Psychiatry

## 2018-11-23 DIAGNOSIS — F411 Generalized anxiety disorder: Secondary | ICD-10-CM

## 2018-11-23 DIAGNOSIS — F319 Bipolar disorder, unspecified: Secondary | ICD-10-CM | POA: Diagnosis not present

## 2018-11-23 DIAGNOSIS — R69 Illness, unspecified: Secondary | ICD-10-CM | POA: Diagnosis not present

## 2018-11-23 MED ORDER — SERTRALINE HCL 100 MG PO TABS: ORAL_TABLET | ORAL | 0 refills | Status: DC

## 2018-11-23 MED ORDER — RISPERIDONE 0.25 MG PO TABS: ORAL_TABLET | ORAL | 1 refills | Status: DC
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
## 2018-12-04 ENCOUNTER — Other Ambulatory Visit: Payer: Self-pay

## 2018-12-04 ENCOUNTER — Encounter (HOSPITAL_COMMUNITY): Payer: Self-pay | Admitting: Psychiatry

## 2018-12-04 ENCOUNTER — Ambulatory Visit (INDEPENDENT_AMBULATORY_CARE_PROVIDER_SITE_OTHER): Payer: Medicare HMO | Admitting: Psychiatry

## 2018-12-04 DIAGNOSIS — R69 Illness, unspecified: Secondary | ICD-10-CM | POA: Diagnosis not present

## 2018-12-04 DIAGNOSIS — F411 Generalized anxiety disorder: Secondary | ICD-10-CM

## 2018-12-04 DIAGNOSIS — F319 Bipolar disorder, unspecified: Secondary | ICD-10-CM | POA: Diagnosis not present
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
## 2019-01-01 ENCOUNTER — Telehealth (HOSPITAL_COMMUNITY): Payer: Self-pay | Admitting: Psychiatry

## 2019-01-02 ENCOUNTER — Encounter (HOSPITAL_COMMUNITY): Payer: Self-pay | Admitting: Psychiatry

## 2019-01-02 ENCOUNTER — Other Ambulatory Visit: Payer: Self-pay

## 2019-01-02 ENCOUNTER — Ambulatory Visit (INDEPENDENT_AMBULATORY_CARE_PROVIDER_SITE_OTHER): Payer: Medicare HMO | Admitting: Psychiatry

## 2019-01-02 DIAGNOSIS — F319 Bipolar disorder, unspecified: Secondary | ICD-10-CM | POA: Diagnosis not present

## 2019-01-02 DIAGNOSIS — F411 Generalized anxiety disorder: Secondary | ICD-10-CM | POA: Diagnosis not present

## 2019-01-02 DIAGNOSIS — R69 Illness, unspecified: Secondary | ICD-10-CM | POA: Diagnosis not present

## 2019-01-02 MED ORDER — RISPERIDONE 0.25 MG PO TABS: ORAL_TABLET | ORAL | 2 refills | Status: DC

## 2019-01-02 MED ORDER — SERTRALINE HCL 100 MG PO TABS: ORAL_TABLET | ORAL | 0 refills | Status: DC
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
## 2019-01-29 ENCOUNTER — Other Ambulatory Visit (HOSPITAL_COMMUNITY): Payer: Self-pay | Admitting: Psychiatry

## 2019-01-29 DIAGNOSIS — F319 Bipolar disorder, unspecified: Secondary | ICD-10-CM

## 2019-02-05 ENCOUNTER — Ambulatory Visit (HOSPITAL_COMMUNITY): Payer: Medicare HMO | Admitting: Psychiatry

## 2019-03-08 DIAGNOSIS — Z720 Tobacco use: Secondary | ICD-10-CM | POA: Diagnosis not present

## 2019-03-08 DIAGNOSIS — E785 Hyperlipidemia, unspecified: Secondary | ICD-10-CM | POA: Diagnosis not present

## 2019-03-08 DIAGNOSIS — N529 Male erectile dysfunction, unspecified: Secondary | ICD-10-CM | POA: Diagnosis not present

## 2019-03-08 DIAGNOSIS — G20 Parkinson's disease: Secondary | ICD-10-CM | POA: Diagnosis not present

## 2019-03-08 DIAGNOSIS — E669 Obesity, unspecified: Secondary | ICD-10-CM | POA: Diagnosis not present

## 2019-03-08 DIAGNOSIS — R69 Illness, unspecified: Secondary | ICD-10-CM | POA: Diagnosis not present

## 2019-03-13 DIAGNOSIS — Z9889 Other specified postprocedural states: Secondary | ICD-10-CM | POA: Diagnosis not present

## 2019-03-13 DIAGNOSIS — Z125 Encounter for screening for malignant neoplasm of prostate: Secondary | ICD-10-CM | POA: Diagnosis not present

## 2019-03-13 DIAGNOSIS — I739 Peripheral vascular disease, unspecified: Secondary | ICD-10-CM | POA: Diagnosis not present

## 2019-03-13 DIAGNOSIS — N183 Chronic kidney disease, stage 3 (moderate): Secondary | ICD-10-CM | POA: Diagnosis not present

## 2019-03-13 DIAGNOSIS — R7303 Prediabetes: Secondary | ICD-10-CM | POA: Diagnosis not present

## 2019-03-13 DIAGNOSIS — G20 Parkinson's disease: Secondary | ICD-10-CM | POA: Diagnosis not present

## 2019-03-13 DIAGNOSIS — E782 Mixed hyperlipidemia: Secondary | ICD-10-CM | POA: Diagnosis not present

## 2019-03-13 DIAGNOSIS — Z0000 Encounter for general adult medical examination without abnormal findings: Secondary | ICD-10-CM | POA: Diagnosis not present

## 2019-03-13 DIAGNOSIS — Z1389 Encounter for screening for other disorder: Secondary | ICD-10-CM | POA: Diagnosis not present

## 2019-03-13 DIAGNOSIS — R69 Illness, unspecified: Secondary | ICD-10-CM | POA: Diagnosis not present

## 2019-03-28 ENCOUNTER — Other Ambulatory Visit (HOSPITAL_COMMUNITY): Payer: Self-pay | Admitting: Psychiatry

## 2019-03-28 DIAGNOSIS — F319 Bipolar disorder, unspecified: Secondary | ICD-10-CM

## 2019-03-28 DIAGNOSIS — F411 Generalized anxiety disorder: Secondary | ICD-10-CM

## 2019-04-02 ENCOUNTER — Ambulatory Visit (INDEPENDENT_AMBULATORY_CARE_PROVIDER_SITE_OTHER): Payer: Medicare HMO | Admitting: Psychiatry

## 2019-04-02 ENCOUNTER — Encounter (HOSPITAL_COMMUNITY): Payer: Self-pay | Admitting: Psychiatry

## 2019-04-02 ENCOUNTER — Other Ambulatory Visit: Payer: Self-pay

## 2019-04-02 DIAGNOSIS — F411 Generalized anxiety disorder: Secondary | ICD-10-CM

## 2019-04-02 DIAGNOSIS — F319 Bipolar disorder, unspecified: Secondary | ICD-10-CM

## 2019-04-02 DIAGNOSIS — R69 Illness, unspecified: Secondary | ICD-10-CM | POA: Diagnosis not present

## 2019-04-02 MED ORDER — RISPERIDONE 0.25 MG PO TABS: ORAL_TABLET | ORAL | 2 refills | Status: DC

## 2019-04-02 MED ORDER — SERTRALINE HCL 100 MG PO TABS: ORAL_TABLET | ORAL | 0 refills | Status: DC

## 2019-04-02 NOTE — Progress Notes (Signed)
Virtual Visit via Telephone Note  I connected with Fernando Carr on 04/02/19 at 10:20 AM EDT by telephone and verified that I am speaking with the correct person using two identifiers.   I discussed the limitations, risks, security and privacy concerns of performing an evaluation and management service by telephone and the availability of in person appointments. I also discussed with the patient that there may be a patient responsible charge related to this service. The patient expressed understanding and agreed to proceed.   History of Present Illness: Patient was evaluated through phone session.  He is pleased that no more issues with the daughter and he dropped the charges after talking to her daughter.  However he admitted lately he bought a brand-new car [employer] for himself.  He regret doing it but he also felt that he needed since his old car was not running very well.  When I questioned about taking his medication he admitted that he is only taking respite all 0.25 mg 2 tablet at bedtime instead of 3.  He is not sure why is only taking 2 but he promised to go back on Risperdal 0.25 mg to take 3 tablet every night.  He admitted when he was taking to tell that he was not sleeping as good and having some racing thoughts.  He has mild tremors in his hand.  Recently saw Dr. Deforest Hoyles for his physical and he was told everything is good.  He denies any severe mood swings, irritability, anger, crying spells, feeling of hopelessness or worthlessness.  However he admitted it was a impulsive decision to buy a new car but now he feel he needed and like to keep it.  He thinks he can make the payments for his new car.  His appetite is okay.  He is trying to lose weight and he has lost 10 pounds since the last visit.  Denies drinking or using any illegal substances.  Is not interested in therapy.  Past Psychiatric History:Reviewed. H/Osuicidal attemptbyinjectingEthyline Glycol. He suffered acute renal  failure and required dialysis at that time. He suffered significant loss including his property and he was feeling hopeless helpless and having paranoid thoughts. In the past he had tried Abilify and Lexapro. However he had a good response with Risperdal.   Psychiatric Specialty Exam: Physical Exam  ROS  There were no vitals taken for this visit.There is no height or weight on file to calculate BMI.  General Appearance: NA  Eye Contact:  NA  Speech:  Slow  Volume:  Decreased  Mood:  NA  Affect:  NA  Thought Process:  Descriptions of Associations: Intact  Orientation:  Full (Time, Place, and Person)  Thought Content:  WDL  Suicidal Thoughts:  No  Homicidal Thoughts:  No  Memory:  Immediate;   Fair Recent;   Fair Remote;   Fair  Judgement:  Good  Insight:  Fair  Psychomotor Activity:  NA  Concentration:  Concentration: Fair and Attention Span: Fair  Recall:  [place] of Knowledge:  Good  Language:  Good  Akathisia:  mild tremors  Handed:  Right  AIMS (if indicated):     Assets:  Communication Skills Housing Resilience  ADL's:  Intact  Cognition:  WNL  Sleep:         Assessment and Plan: Bipolar disorder type I.  Generalized anxiety disorder.  Discussed to take the medicine as prescribed.  Recommend to take Risperdal 0.75 mg at bedtime to avoid relapse and  impulsive decision.  He agreed with the plan.  His relationship with the daughter is much better and he talk to her on a regular basis.  He is happy that able to see the grand kids on a regular basis.  Discussed medication side effects and benefits.  We will contact Dr. Sherilyn Cooter office for his blood work.  Continue Risperdal 0.75 mg at bedtime and Zoloft 100 mg daily.  Recommended to call us back if he has any question or any concern.  Follow-up in 3 months.  Follow Up Instructions:    I discussed the assessment and treatment plan with the patient. The patient was provided an opportunity to ask questions and all  were answered. The patient agreed with the plan and demonstrated an understanding of the instructions.   The patient was advised to call back or seek an in-person evaluation if the symptoms worsen or if the condition fails to improve as anticipated.  I provided 20 minutes of non-face-to-face time during this encounter.   Kathlee Nations, MD

## 2019-04-03 DIAGNOSIS — Z23 Encounter for immunization: Secondary | ICD-10-CM | POA: Diagnosis not present

## 2019-04-04 ENCOUNTER — Ambulatory Visit (HOSPITAL_COMMUNITY): Payer: Medicare HMO | Admitting: Psychiatry

## 2019-04-09 ENCOUNTER — Encounter: Payer: Self-pay | Admitting: Diagnostic Neuroimaging

## 2019-04-09 ENCOUNTER — Ambulatory Visit (INDEPENDENT_AMBULATORY_CARE_PROVIDER_SITE_OTHER): Payer: Medicare HMO | Admitting: Diagnostic Neuroimaging

## 2019-04-09 ENCOUNTER — Other Ambulatory Visit: Payer: Self-pay

## 2019-04-09 VITALS — BP 140/90 | HR 88 | Temp 98.0°F | Ht 72.0 in | Wt 234.4 lb

## 2019-04-09 DIAGNOSIS — G20 Parkinson's disease: Secondary | ICD-10-CM

## 2019-04-09 MED ORDER — CARBIDOPA-LEVODOPA 25-100 MG PO TABS: 1.0000 | ORAL_TABLET | Freq: Three times a day (TID) | ORAL | 4 refills | Status: DC
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
## 2019-04-09 NOTE — Patient Instructions (Signed)
PARKINSON'S DISEASE - continue carb/levo 25/100 1 tab three times a day  - encouraged balance and strength exercises - caution with living alone and driving; discuss with family and PCP

## 2019-07-02 ENCOUNTER — Other Ambulatory Visit: Payer: Self-pay

## 2019-07-02 ENCOUNTER — Ambulatory Visit (INDEPENDENT_AMBULATORY_CARE_PROVIDER_SITE_OTHER): Payer: Medicare HMO | Admitting: Psychiatry

## 2019-07-02 ENCOUNTER — Encounter (HOSPITAL_COMMUNITY): Payer: Self-pay | Admitting: Psychiatry

## 2019-07-02 DIAGNOSIS — F411 Generalized anxiety disorder: Secondary | ICD-10-CM

## 2019-07-02 DIAGNOSIS — F319 Bipolar disorder, unspecified: Secondary | ICD-10-CM | POA: Diagnosis not present

## 2019-07-02 DIAGNOSIS — R69 Illness, unspecified: Secondary | ICD-10-CM | POA: Diagnosis not present

## 2019-07-02 MED ORDER — SERTRALINE HCL 100 MG PO TABS: ORAL_TABLET | ORAL | 0 refills | Status: DC

## 2019-07-02 MED ORDER — RISPERIDONE 0.25 MG PO TABS: ORAL_TABLET | ORAL | 2 refills | Status: DC
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
## 2019-07-15 DIAGNOSIS — H43393 Other vitreous opacities, bilateral: Secondary | ICD-10-CM | POA: Diagnosis not present

## 2019-07-15 DIAGNOSIS — H35013 Changes in retinal vascular appearance, bilateral: Secondary | ICD-10-CM | POA: Diagnosis not present

## 2019-07-15 DIAGNOSIS — H353131 Nonexudative age-related macular degeneration, bilateral, early dry stage: Secondary | ICD-10-CM | POA: Diagnosis not present

## 2019-07-15 DIAGNOSIS — H35033 Hypertensive retinopathy, bilateral: Secondary | ICD-10-CM | POA: Diagnosis not present

## 2019-09-09 ENCOUNTER — Ambulatory Visit: Payer: Medicare HMO | Attending: Internal Medicine

## 2019-09-09 DIAGNOSIS — Z23 Encounter for immunization: Secondary | ICD-10-CM | POA: Insufficient documentation

## 2019-09-09 NOTE — Progress Notes (Signed)
   Covid-19 Vaccination Clinic  Name:  Fernando Carr    MRN: [id number] DOB: 10-11-1940  09/09/2019  [name] was observed post Covid-19 immunization for 15 minutes without incidence. He was provided with Vaccine Information Sheet and instruction to access the V-Safe system.   [name] was instructed to call 911 with any severe reactions post vaccine: Marland Kitchen Difficulty breathing  . Swelling of your face and throat  . A fast heartbeat  . A bad rash all over your body  . Dizziness and weakness    Immunizations Administered    Name Date Dose VIS Date Route   Pfizer COVID-19 Vaccine 09/09/2019  3:46 PM 0.3 mL 07/12/2019 Intramuscular   Manufacturer: [place]   Lot: [id number]   [place]: [id number]

## 2019-09-26 ENCOUNTER — Emergency Department (HOSPITAL_COMMUNITY): Payer: Medicare HMO

## 2019-09-26 ENCOUNTER — Observation Stay (HOSPITAL_COMMUNITY): Payer: Medicare HMO

## 2019-09-26 ENCOUNTER — Other Ambulatory Visit: Payer: Self-pay

## 2019-09-26 ENCOUNTER — Inpatient Hospital Stay (HOSPITAL_COMMUNITY)
Admission: EM | Admit: 2019-09-26 | Discharge: 2019-10-07 | DRG: 057 | Disposition: A | Discharge status: Skilled Nursing Facility | Payer: Medicare HMO | Attending: Internal Medicine | Admitting: Internal Medicine

## 2019-09-26 ENCOUNTER — Encounter (HOSPITAL_COMMUNITY): Payer: Self-pay | Admitting: Emergency Medicine

## 2019-09-26 DIAGNOSIS — N1831 Chronic kidney disease, stage 3a: Secondary | ICD-10-CM | POA: Diagnosis present

## 2019-09-26 DIAGNOSIS — G20 Parkinson's disease: Secondary | ICD-10-CM | POA: Diagnosis present

## 2019-09-26 DIAGNOSIS — R55 Syncope and collapse: Secondary | ICD-10-CM

## 2019-09-26 DIAGNOSIS — G903 Multi-system degeneration of the autonomic nervous system: Principal | ICD-10-CM | POA: Diagnosis present

## 2019-09-26 DIAGNOSIS — E785 Hyperlipidemia, unspecified: Secondary | ICD-10-CM | POA: Diagnosis present

## 2019-09-26 DIAGNOSIS — Z79899 Other long term (current) drug therapy: Secondary | ICD-10-CM

## 2019-09-26 DIAGNOSIS — F411 Generalized anxiety disorder: Secondary | ICD-10-CM

## 2019-09-26 DIAGNOSIS — N179 Acute kidney failure, unspecified: Secondary | ICD-10-CM | POA: Diagnosis present

## 2019-09-26 DIAGNOSIS — E869 Volume depletion, unspecified: Secondary | ICD-10-CM | POA: Diagnosis not present

## 2019-09-26 DIAGNOSIS — Z20822 Contact with and (suspected) exposure to covid-19: Secondary | ICD-10-CM | POA: Diagnosis present

## 2019-09-26 DIAGNOSIS — Z8679 Personal history of other diseases of the circulatory system: Secondary | ICD-10-CM

## 2019-09-26 DIAGNOSIS — E860 Dehydration: Secondary | ICD-10-CM | POA: Diagnosis present

## 2019-09-26 DIAGNOSIS — F319 Bipolar disorder, unspecified: Secondary | ICD-10-CM | POA: Diagnosis present

## 2019-09-26 DIAGNOSIS — F17210 Nicotine dependence, cigarettes, uncomplicated: Secondary | ICD-10-CM | POA: Diagnosis present

## 2019-09-26 DIAGNOSIS — Z818 Family history of other mental and behavioral disorders: Secondary | ICD-10-CM

## 2019-09-26 DIAGNOSIS — I129 Hypertensive chronic kidney disease with stage 1 through stage 4 chronic kidney disease, or unspecified chronic kidney disease: Secondary | ICD-10-CM | POA: Diagnosis present

## 2019-09-26 HISTORY — DX: Syncope and collapse: R55

## 2019-09-26 LAB — COMPREHENSIVE METABOLIC PANEL
ALT: 6 U/L (ref 0–44)
AST: 21 U/L (ref 15–41)
Albumin: 3.9 g/dL (ref 3.5–5.0)
Alkaline Phosphatase: 66 U/L (ref 38–126)
Anion gap: 6 (ref 5–15)
BUN: 29 mg/dL — ABNORMAL HIGH (ref 8–23)
CO2: 27 mmol/L (ref 22–32)
Calcium: 10.4 mg/dL — ABNORMAL HIGH (ref 8.9–10.3)
Chloride: 107 mmol/L (ref 98–111)
Creatinine, Ser: 1.78 mg/dL — ABNORMAL HIGH (ref 0.61–1.24)
GFR calc Af Amer: 41 mL/min — ABNORMAL LOW (ref >60)
GFR calc non Af Amer: 36 mL/min — ABNORMAL LOW (ref >60)
Glucose, Bld: 93 mg/dL (ref 70–99)
Potassium: 3.8 mmol/L (ref 3.5–5.1)
Sodium: 140 mmol/L (ref 135–145)
Total Bilirubin: 0.6 mg/dL (ref 0.3–1.2)
Total Protein: 7 g/dL (ref 6.5–8.1)

## 2019-09-26 LAB — CBC WITH DIFFERENTIAL/PLATELET
Abs Immature Granulocytes: 0.02 10*3/uL (ref 0.00–0.07)
Basophils Absolute: 0 10*3/uL (ref 0.0–0.1)
Basophils Relative: 0 %
Eosinophils Absolute: 0 10*3/uL (ref 0.0–0.5)
Eosinophils Relative: 0 %
HCT: 42.1 % (ref 39.0–52.0)
Hemoglobin: 12.9 g/dL — ABNORMAL LOW (ref 13.0–17.0)
Immature Granulocytes: 0 %
Lymphocytes Relative: 15 %
Lymphs Abs: 0.8 10*3/uL (ref 0.7–4.0)
MCH: 28.7 pg (ref 26.0–34.0)
MCHC: 30.6 g/dL (ref 30.0–36.0)
MCV: 93.6 fL (ref 80.0–100.0)
Monocytes Absolute: 0.5 10*3/uL (ref 0.1–1.0)
Monocytes Relative: 9 %
Neutro Abs: 4.2 10*3/uL (ref 1.7–7.7)
Neutrophils Relative %: 76 %
Platelets: 163 10*3/uL (ref 150–400)
RBC: 4.5 MIL/uL (ref 4.22–5.81)
RDW: 12.9 % (ref 11.5–15.5)
WBC: 5.6 10*3/uL (ref 4.0–10.5)
nRBC: 0 % (ref 0.0–0.2)

## 2019-09-26 LAB — URINALYSIS, ROUTINE W REFLEX MICROSCOPIC
Bilirubin Urine: NEGATIVE
Glucose, UA: NEGATIVE mg/dL
Hgb urine dipstick: NEGATIVE
Ketones, ur: NEGATIVE mg/dL
Leukocytes,Ua: NEGATIVE
Nitrite: NEGATIVE
Protein, ur: 30 mg/dL — AB
Specific Gravity, Urine: 1.02 (ref 1.005–1.030)
pH: 6.5 (ref 5.0–8.0)

## 2019-09-26 LAB — TROPONIN I (HIGH SENSITIVITY)
Troponin I (High Sensitivity): 10 ng/L (ref <18)
Troponin I (High Sensitivity): 11 ng/L (ref <18)

## 2019-09-26 LAB — MAGNESIUM: Magnesium: 2.1 mg/dL (ref 1.7–2.4)

## 2019-09-26 LAB — URINALYSIS, MICROSCOPIC (REFLEX)
Bacteria, UA: NONE SEEN
RBC / HPF: NONE SEEN RBC/hpf (ref 0–5)
Squamous Epithelial / HPF: NONE SEEN (ref 0–5)
WBC, UA: NONE SEEN WBC/hpf (ref 0–5)

## 2019-09-26 LAB — PHOSPHORUS: Phosphorus: 2.6 mg/dL (ref 2.5–4.6)

## 2019-09-26 LAB — TSH: TSH: 0.468 u[IU]/mL (ref 0.350–4.500)

## 2019-09-26 LAB — GLUCOSE, CAPILLARY: Glucose-Capillary: 96 mg/dL (ref 70–99)

## 2019-09-26 MED ORDER — FENOFIBRATE 54 MG PO TABS
54.0000 mg | ORAL_TABLET | Freq: Every day | ORAL | Status: DC
  Administered 2019-09-27 – 2019-10-07 (×11): 54 mg via ORAL
  Filled 2019-09-26 (×12): qty 1

## 2019-09-26 MED ORDER — CARBIDOPA-LEVODOPA 25-100 MG PO TABS
1.0000 | ORAL_TABLET | Freq: Three times a day (TID) | ORAL | Status: DC
  Administered 2019-09-27 – 2019-10-07 (×31): 1 via ORAL
  Filled 2019-09-26 (×30): qty 1

## 2019-09-26 MED ORDER — SERTRALINE HCL 100 MG PO TABS
100.0000 mg | ORAL_TABLET | Freq: Every day | ORAL | Status: DC
  Administered 2019-09-27 – 2019-10-07 (×11): 100 mg via ORAL
  Filled 2019-09-26 (×11): qty 1

## 2019-09-26 MED ORDER — HEPARIN SODIUM (PORCINE) 5000 UNIT/ML IJ SOLN
5000.0000 [IU] | Freq: Three times a day (TID) | INTRAMUSCULAR | Status: DC
  Administered 2019-09-26 – 2019-10-07 (×32): 5000 [IU] via SUBCUTANEOUS
  Filled 2019-09-26 (×30): qty 1

## 2019-09-26 MED ORDER — SIMVASTATIN 10 MG PO TABS
10.0000 mg | ORAL_TABLET | Freq: Every day | ORAL | Status: DC
  Administered 2019-09-27 – 2019-10-06 (×10): 10 mg via ORAL
  Filled 2019-09-26 (×10): qty 1

## 2019-09-26 MED ORDER — RISPERIDONE 0.5 MG PO TABS
0.7500 mg | ORAL_TABLET | Freq: Every day | ORAL | Status: DC
  Administered 2019-09-26 – 2019-10-06 (×11): 0.75 mg via ORAL
  Filled 2019-09-26 (×11): qty 1

## 2019-09-26 MED ORDER — SODIUM CHLORIDE 0.9 % IV SOLN: INTRAVENOUS | Status: DC

## 2019-09-26 MED ORDER — SODIUM CHLORIDE 0.9 % IV BOLUS
1000.0000 mL | Freq: Once | INTRAVENOUS | Status: AC
  Administered 2019-09-26: 18:00:00 1000 mL via INTRAVENOUS

## 2019-09-26 MED ORDER — NICOTINE 14 MG/24HR TD PT24
14.0000 mg | MEDICATED_PATCH | Freq: Every day | TRANSDERMAL | Status: DC
  Administered 2019-09-26 – 2019-10-07 (×12): 14 mg via TRANSDERMAL
  Filled 2019-09-26 (×12): qty 1

## 2019-09-26 NOTE — ED Notes (Signed)
Report called to nurse Claiborne Billings. Pt ready for transport upstairs

## 2019-09-26 NOTE — ED Notes (Signed)
Culture not sent down with u/a

## 2019-09-26 NOTE — ED Notes (Signed)
Pt taken to RR in wheelchair and he was advised to pull the call-cord when he was finished so that we could assist him back to his room.
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
## 2019-09-26 NOTE — ED Notes (Signed)
Admitting doctor at bedside 
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
## 2019-09-27 ENCOUNTER — Observation Stay (HOSPITAL_COMMUNITY): Payer: Medicare HMO

## 2019-09-27 DIAGNOSIS — E860 Dehydration: Secondary | ICD-10-CM | POA: Diagnosis present

## 2019-09-27 DIAGNOSIS — I10 Essential (primary) hypertension: Secondary | ICD-10-CM | POA: Diagnosis not present

## 2019-09-27 DIAGNOSIS — N189 Chronic kidney disease, unspecified: Secondary | ICD-10-CM | POA: Diagnosis not present

## 2019-09-27 DIAGNOSIS — F411 Generalized anxiety disorder: Secondary | ICD-10-CM | POA: Diagnosis not present

## 2019-09-27 DIAGNOSIS — Z818 Family history of other mental and behavioral disorders: Secondary | ICD-10-CM | POA: Diagnosis not present

## 2019-09-27 DIAGNOSIS — Z8679 Personal history of other diseases of the circulatory system: Secondary | ICD-10-CM | POA: Diagnosis not present

## 2019-09-27 DIAGNOSIS — Z20822 Contact with and (suspected) exposure to covid-19: Secondary | ICD-10-CM | POA: Diagnosis present

## 2019-09-27 DIAGNOSIS — E785 Hyperlipidemia, unspecified: Secondary | ICD-10-CM | POA: Diagnosis present

## 2019-09-27 DIAGNOSIS — N1831 Chronic kidney disease, stage 3a: Secondary | ICD-10-CM | POA: Diagnosis present

## 2019-09-27 DIAGNOSIS — G20 Parkinson's disease: Secondary | ICD-10-CM | POA: Diagnosis present

## 2019-09-27 DIAGNOSIS — Z79899 Other long term (current) drug therapy: Secondary | ICD-10-CM | POA: Diagnosis not present

## 2019-09-27 DIAGNOSIS — R55 Syncope and collapse: Secondary | ICD-10-CM | POA: Diagnosis present

## 2019-09-27 DIAGNOSIS — F319 Bipolar disorder, unspecified: Secondary | ICD-10-CM | POA: Diagnosis present

## 2019-09-27 DIAGNOSIS — F17210 Nicotine dependence, cigarettes, uncomplicated: Secondary | ICD-10-CM | POA: Diagnosis present

## 2019-09-27 DIAGNOSIS — I129 Hypertensive chronic kidney disease with stage 1 through stage 4 chronic kidney disease, or unspecified chronic kidney disease: Secondary | ICD-10-CM | POA: Diagnosis present

## 2019-09-27 DIAGNOSIS — N179 Acute kidney failure, unspecified: Secondary | ICD-10-CM | POA: Diagnosis present

## 2019-09-27 DIAGNOSIS — G903 Multi-system degeneration of the autonomic nervous system: Secondary | ICD-10-CM | POA: Diagnosis present

## 2019-09-27 LAB — BASIC METABOLIC PANEL
Anion gap: 6 (ref 5–15)
BUN: 32 mg/dL — ABNORMAL HIGH (ref 8–23)
CO2: 27 mmol/L (ref 22–32)
Calcium: 10.2 mg/dL (ref 8.9–10.3)
Chloride: 107 mmol/L (ref 98–111)
Creatinine, Ser: 1.65 mg/dL — ABNORMAL HIGH (ref 0.61–1.24)
GFR calc Af Amer: 45 mL/min — ABNORMAL LOW (ref >60)
GFR calc non Af Amer: 39 mL/min — ABNORMAL LOW (ref >60)
Glucose, Bld: 101 mg/dL — ABNORMAL HIGH (ref 70–99)
Potassium: 4.1 mmol/L (ref 3.5–5.1)
Sodium: 140 mmol/L (ref 135–145)

## 2019-09-27 LAB — CBC
HCT: 38.4 % — ABNORMAL LOW (ref 39.0–52.0)
Hemoglobin: 11.8 g/dL — ABNORMAL LOW (ref 13.0–17.0)
MCH: 28.9 pg (ref 26.0–34.0)
MCHC: 30.7 g/dL (ref 30.0–36.0)
MCV: 93.9 fL (ref 80.0–100.0)
Platelets: 136 10*3/uL — ABNORMAL LOW (ref 150–400)
RBC: 4.09 MIL/uL — ABNORMAL LOW (ref 4.22–5.81)
RDW: 12.9 % (ref 11.5–15.5)
WBC: 5.4 10*3/uL (ref 4.0–10.5)
nRBC: 0 % (ref 0.0–0.2)

## 2019-09-27 LAB — ECHOCARDIOGRAM COMPLETE
Height: 72 in
Weight: 3587.33 oz

## 2019-09-27 LAB — GLUCOSE, CAPILLARY
Glucose-Capillary: 78 mg/dL (ref 70–99)
Glucose-Capillary: 156 mg/dL — ABNORMAL HIGH (ref 70–99)
Glucose-Capillary: 97 mg/dL (ref 70–99)
Glucose-Capillary: 85 mg/dL (ref 70–99)

## 2019-09-27 LAB — PROTEIN / CREATININE RATIO, URINE
Creatinine, Urine: 89.69 mg/dL
Protein Creatinine Ratio: 0.19 mg/mg{Cre} — ABNORMAL HIGH (ref 0.00–0.15)
Total Protein, Urine: 17 mg/dL

## 2019-09-27 LAB — SODIUM, URINE, RANDOM: Sodium, Ur: 139 mmol/L

## 2019-09-27 LAB — SARS CORONAVIRUS 2 (TAT 6-24 HRS): SARS Coronavirus 2: NEGATIVE
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
## 2019-09-27 NOTE — Progress Notes (Signed)
  Echocardiogram 2D Echocardiogram has been performed.  Bobbye Charleston 09/27/2019, 12:10 PM
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
## 2019-09-27 NOTE — Progress Notes (Signed)
Carotid duplex has been completed.   Preliminary results in CV Proc.   Abram Sander 09/27/2019 9:16 AM

## 2019-09-28 LAB — RENAL FUNCTION PANEL
Albumin: 3.4 g/dL — ABNORMAL LOW (ref 3.5–5.0)
Anion gap: 5 (ref 5–15)
BUN: 28 mg/dL — ABNORMAL HIGH (ref 8–23)
CO2: 25 mmol/L (ref 22–32)
Calcium: 10.2 mg/dL (ref 8.9–10.3)
Chloride: 108 mmol/L (ref 98–111)
Creatinine, Ser: 1.49 mg/dL — ABNORMAL HIGH (ref 0.61–1.24)
GFR calc Af Amer: 51 mL/min — ABNORMAL LOW (ref >60)
GFR calc non Af Amer: 44 mL/min — ABNORMAL LOW (ref >60)
Glucose, Bld: 86 mg/dL (ref 70–99)
Phosphorus: 2.5 mg/dL (ref 2.5–4.6)
Potassium: 3.7 mmol/L (ref 3.5–5.1)
Sodium: 138 mmol/L (ref 135–145)

## 2019-09-28 LAB — GLUCOSE, CAPILLARY
Glucose-Capillary: 87 mg/dL (ref 70–99)
Glucose-Capillary: 108 mg/dL — ABNORMAL HIGH (ref 70–99)
Glucose-Capillary: 96 mg/dL (ref 70–99)
Glucose-Capillary: 97 mg/dL (ref 70–99)
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
## 2019-09-28 NOTE — Plan of Care (Signed)

## 2019-09-28 NOTE — Progress Notes (Signed)
Order for Orthostatic VS. BP and Pulse difficult to read and obtain due to Pt has Parkinson's tremors to bilateral upper extremities. MD updated via phone. Maintain current plan of care for Pt.
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
## 2019-09-29 ENCOUNTER — Encounter (HOSPITAL_COMMUNITY): Payer: Self-pay | Admitting: Family Medicine

## 2019-09-29 ENCOUNTER — Other Ambulatory Visit: Payer: Self-pay | Admitting: Cardiology

## 2019-09-29 DIAGNOSIS — R55 Syncope and collapse: Secondary | ICD-10-CM

## 2019-09-29 DIAGNOSIS — I10 Essential (primary) hypertension: Secondary | ICD-10-CM

## 2019-09-29 DIAGNOSIS — N189 Chronic kidney disease, unspecified: Secondary | ICD-10-CM

## 2019-09-29 LAB — RENAL FUNCTION PANEL
Albumin: 3.3 g/dL — ABNORMAL LOW (ref 3.5–5.0)
Anion gap: 5 (ref 5–15)
BUN: 27 mg/dL — ABNORMAL HIGH (ref 8–23)
CO2: 25 mmol/L (ref 22–32)
Calcium: 10.3 mg/dL (ref 8.9–10.3)
Chloride: 109 mmol/L (ref 98–111)
Creatinine, Ser: 1.32 mg/dL — ABNORMAL HIGH (ref 0.61–1.24)
GFR calc Af Amer: 59 mL/min — ABNORMAL LOW (ref >60)
GFR calc non Af Amer: 51 mL/min — ABNORMAL LOW (ref >60)
Glucose, Bld: 89 mg/dL (ref 70–99)
Phosphorus: 2.7 mg/dL (ref 2.5–4.6)
Potassium: 4.3 mmol/L (ref 3.5–5.1)
Sodium: 139 mmol/L (ref 135–145)

## 2019-09-29 LAB — GLUCOSE, CAPILLARY
Glucose-Capillary: 81 mg/dL (ref 70–99)
Glucose-Capillary: 99 mg/dL (ref 70–99)
Glucose-Capillary: 106 mg/dL — ABNORMAL HIGH (ref 70–99)
Glucose-Capillary: 157 mg/dL — ABNORMAL HIGH (ref 70–99)
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
## 2019-09-30 ENCOUNTER — Encounter: Payer: Self-pay | Admitting: Cardiology

## 2019-09-30 ENCOUNTER — Other Ambulatory Visit (HOSPITAL_COMMUNITY): Payer: Self-pay | Admitting: Psychiatry

## 2019-09-30 ENCOUNTER — Ambulatory Visit (INDEPENDENT_AMBULATORY_CARE_PROVIDER_SITE_OTHER): Payer: Medicare HMO | Admitting: Psychiatry

## 2019-09-30 ENCOUNTER — Telehealth: Payer: Self-pay | Admitting: *Deleted

## 2019-09-30 ENCOUNTER — Encounter (HOSPITAL_COMMUNITY): Payer: Self-pay | Admitting: Psychiatry

## 2019-09-30 DIAGNOSIS — F411 Generalized anxiety disorder: Secondary | ICD-10-CM

## 2019-09-30 DIAGNOSIS — F319 Bipolar disorder, unspecified: Secondary | ICD-10-CM | POA: Diagnosis not present

## 2019-09-30 LAB — RENAL FUNCTION PANEL
Albumin: 3.3 g/dL — ABNORMAL LOW (ref 3.5–5.0)
Anion gap: 6 (ref 5–15)
BUN: 30 mg/dL — ABNORMAL HIGH (ref 8–23)
CO2: 27 mmol/L (ref 22–32)
Calcium: 10.4 mg/dL — ABNORMAL HIGH (ref 8.9–10.3)
Chloride: 105 mmol/L (ref 98–111)
Creatinine, Ser: 1.44 mg/dL — ABNORMAL HIGH (ref 0.61–1.24)
GFR calc Af Amer: 54 mL/min — ABNORMAL LOW (ref >60)
GFR calc non Af Amer: 46 mL/min — ABNORMAL LOW (ref >60)
Glucose, Bld: 107 mg/dL — ABNORMAL HIGH (ref 70–99)
Phosphorus: 3.5 mg/dL (ref 2.5–4.6)
Potassium: 4.1 mmol/L (ref 3.5–5.1)
Sodium: 138 mmol/L (ref 135–145)

## 2019-09-30 LAB — GLUCOSE, CAPILLARY
Glucose-Capillary: 76 mg/dL (ref 70–99)
Glucose-Capillary: 99 mg/dL (ref 70–99)
Glucose-Capillary: 86 mg/dL (ref 70–99)
Glucose-Capillary: 96 mg/dL (ref 70–99)

## 2019-09-30 MED ORDER — SODIUM CHLORIDE 0.9 % IV SOLN: INTRAVENOUS | Status: DC

## 2019-09-30 MED ORDER — RISPERIDONE 0.25 MG PO TABS: 0.7500 mg | ORAL_TABLET | Freq: Every day | ORAL | 0 refills | Status: DC

## 2019-09-30 MED ORDER — RISPERIDONE 0.25 MG PO TABS: ORAL_TABLET | ORAL | 1 refills | Status: DC

## 2019-09-30 MED ORDER — SERTRALINE HCL 100 MG PO TABS: ORAL_TABLET | ORAL | 0 refills | Status: DC

## 2019-09-30 MED ORDER — SERTRALINE HCL 100 MG PO TABS: 100.0000 mg | ORAL_TABLET | Freq: Every day | ORAL | 1 refills | Status: DC
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
## 2019-09-30 NOTE — NC FL2 (Signed)
[place] LEVEL OF CARE SCREENING TOOL     IDENTIFICATION  Patient Name: Fernando Carr Birthdate: 1941/06/18 Sex: male Admission Date (Current Location): 09/26/2019  [facility] and [place] Number:  Herbalist and Address:  [facility],  [place] [address], [place]      Provider Number: [id number]  Attending Physician Name and Address:  Nita Sells, MD  Relative Name and Phone Number:  Davaris, [place] Daughter   [phone] or Allena Katz Sister [phone]  [phone]    Current Level of Care: Hospital Recommended Level of Care: [place] Prior Approval Number:    Date Approved/Denied:   PASRR Number: Pending  Discharge Plan: SNF    Current Diagnoses: Patient Active Problem List   Diagnosis Date Noted  . Syncope and collapse 09/26/2019  . MDD (major depressive disorder), recurrent severe, without psychosis ([place]) 08/20/2014  . 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  . Severe depression ([place])   . Major depressive disorder, recurrent, severe without psychotic features ([place])   . AAA (abdominal aortic aneurysm) ([place]) 07/07/2014  . Dehydration 03/10/2014  . FTT (failure to thrive) in adult 03/10/2014  . Aneurysm of right iliac artery ([place]) 03/10/2014  . Sinus bradycardia 03/10/2014  . HTN (hypertension) 03/10/2014  . Hypotension, postural 03/09/2014  . CRD (chronic renal disease), stage III 01/09/2014  . Abdominal aortic aneurysm ([place]) 12/20/2011  . Bipolar 1 disorder ([place]) 10/24/2011  . Hyperlipemia 10/10/2011  . Hypertriglyceridemia 10/03/2011  . Hyperlipidemia 03/24/2011    Orientation RESPIRATION BLADDER Height & Weight     Self, Time, Situation, Place  Normal Incontinent Weight: 224 lb 3.3 oz (101.7 kg) Height:  6' (182.9 cm)  BEHAVIORAL SYMPTOMS/MOOD NEUROLOGICAL BOWEL NUTRITION STATUS      Continent Diet(low sodium heart healthy.)  AMBULATORY STATUS COMMUNICATION OF NEEDS Skin   Limited Assist Verbally  Normal                       Personal Care Assistance Level of Assistance  Bathing, Feeding, Dressing Bathing Assistance: Limited assistance Feeding assistance: Independent Dressing Assistance: Limited assistance     Functional Limitations Info  Sight, Hearing, Speech Sight Info: Adequate Hearing Info: Adequate Speech Info: Adequate    SPECIAL CARE FACTORS FREQUENCY  PT (By licensed PT), OT (By licensed OT)     PT Frequency: Minimum 5x a week OT Frequency: Minimum 5x a week            Contractures Contractures Info: Not present    Additional Factors Info  Code Status, Allergies, Psychotropic Code Status Info: Full Code Allergies Info: No Known Allergies Psychotropic Info: risperiDONE (RISPERDAL) tablet 0.75 mg and sertraline (ZOLOFT) tablet 100 mg         Current Medications (09/30/2019):  This is the current hospital active medication list Current Facility-Administered Medications  Medication Dose Route Frequency Provider Last Rate Last Admin  . 0.9 %  sodium chloride infusion   Intravenous Continuous Barrett, Rhonda G, PA-C 50 mL/hr at 09/30/19 0930 New Bag at 09/30/19 0930  . carbidopa-levodopa (SINEMET IR) 25-100 MG per tablet immediate release 1 tablet  1 tablet Oral TID AC Dana Allan I, MD   1 tablet at 09/30/19 1056  . fenofibrate tablet 54 mg  54 mg Oral Daily Dana Allan I, MD   54 mg at 09/30/19 1059  . heparin injection 5,000 Units  5,000 Units Subcutaneous Q8H Dana Allan I, MD   5,000 Units at 09/30/19 0511  . nicotine (NICODERM  CQ - dosed in mg/24 hours) patch 14 mg  14 mg Transdermal Daily Dana Allan I, MD   14 mg at 09/30/19 1057  . risperiDONE (RISPERDAL) tablet 0.75 mg  0.75 mg Oral QHS Dana Allan I, MD   0.75 mg at 09/29/19 2157  . sertraline (ZOLOFT) tablet 100 mg  100 mg Oral Daily Dana Allan I, MD   100 mg at 09/30/19 1056  . simvastatin (ZOCOR) tablet 10 mg  10 mg Oral q1800 Bonnell Public, MD   10  mg at 09/29/19 1718     Discharge Medications: Please see discharge summary for a list of discharge medications.  Relevant Imaging Results:  Relevant Lab Results:   Additional Information SSN [id number]  Ross Ludwig, LCSW
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
## 2019-09-30 NOTE — Telephone Encounter (Signed)
Patient enrollred for [employer] to ship a 30 day cardiac event monitor to his home.

## 2019-09-30 NOTE — Care Management Important Message (Signed)
Important Message  Patient Details IM Letter given to Evette Cristal SW Case Manager to present to the Patient Name: Fernando Carr MRN: [id number] Date of Birth: [birth year]   Medicare Important Message Given:  Yes     Kerin Salen 09/30/2019, 11:52 AM
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
## 2019-10-01 DIAGNOSIS — N179 Acute kidney failure, unspecified: Secondary | ICD-10-CM

## 2019-10-01 LAB — CBC WITH DIFFERENTIAL/PLATELET
Abs Immature Granulocytes: 0.01 10*3/uL (ref 0.00–0.07)
Basophils Absolute: 0 10*3/uL (ref 0.0–0.1)
Basophils Relative: 0 %
Eosinophils Absolute: 0.1 10*3/uL (ref 0.0–0.5)
Eosinophils Relative: 2 %
HCT: 39.2 % (ref 39.0–52.0)
Hemoglobin: 12.1 g/dL — ABNORMAL LOW (ref 13.0–17.0)
Immature Granulocytes: 0 %
Lymphocytes Relative: 28 %
Lymphs Abs: 1.4 10*3/uL (ref 0.7–4.0)
MCH: 28.8 pg (ref 26.0–34.0)
MCHC: 30.9 g/dL (ref 30.0–36.0)
MCV: 93.3 fL (ref 80.0–100.0)
Monocytes Absolute: 0.5 10*3/uL (ref 0.1–1.0)
Monocytes Relative: 10 %
Neutro Abs: 3 10*3/uL (ref 1.7–7.7)
Neutrophils Relative %: 60 %
Platelets: 121 10*3/uL — ABNORMAL LOW (ref 150–400)
RBC: 4.2 MIL/uL — ABNORMAL LOW (ref 4.22–5.81)
RDW: 13.2 % (ref 11.5–15.5)
WBC: 4.9 10*3/uL (ref 4.0–10.5)
nRBC: 0 % (ref 0.0–0.2)

## 2019-10-01 LAB — GLUCOSE, CAPILLARY
Glucose-Capillary: 82 mg/dL (ref 70–99)
Glucose-Capillary: 120 mg/dL — ABNORMAL HIGH (ref 70–99)
Glucose-Capillary: 88 mg/dL (ref 70–99)
Glucose-Capillary: 96 mg/dL (ref 70–99)

## 2019-10-01 LAB — BASIC METABOLIC PANEL
Anion gap: 9 (ref 5–15)
BUN: 37 mg/dL — ABNORMAL HIGH (ref 8–23)
CO2: 25 mmol/L (ref 22–32)
Calcium: 10.7 mg/dL — ABNORMAL HIGH (ref 8.9–10.3)
Chloride: 103 mmol/L (ref 98–111)
Creatinine, Ser: 1.44 mg/dL — ABNORMAL HIGH (ref 0.61–1.24)
GFR calc Af Amer: 54 mL/min — ABNORMAL LOW (ref >60)
GFR calc non Af Amer: 46 mL/min — ABNORMAL LOW (ref >60)
Glucose, Bld: 100 mg/dL — ABNORMAL HIGH (ref 70–99)
Potassium: 4.3 mmol/L (ref 3.5–5.1)
Sodium: 137 mmol/L (ref 135–145)

## 2019-10-01 LAB — SARS CORONAVIRUS 2 (TAT 6-24 HRS): SARS Coronavirus 2: NEGATIVE

## 2019-10-01 MED ORDER — RISPERIDONE 0.25 MG PO TABS: 0.5000 mg | ORAL_TABLET | Freq: Every day | ORAL | 0 refills | Status: DC
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
## 2019-10-01 NOTE — Discharge Instructions (Signed)
A monitor that you wear for 30 days will be sent to the facility. There will be instructions on how to apply it, the staff should help you with this. You will wear the monitor for 30 days. You will see Dr. Percival Spanish after the monitor is completed.  Keep log of your BP readings -please take your BP twice a day for 2 weeks and send to Dr. Percival Spanish by MyChart or call and speak to his nurse to give readings.

## 2019-10-01 NOTE — TOC Progression Note (Signed)
Transition of Care [facility]) - Progression Note    Patient Details  Name: Fernando Carr MRN: [id number] Date of Birth: 1940/09/22  Transition [facility] CM/SW Contact  Ross Ludwig, [place] Phone Number: 10/01/2019, 10:40 AM  Clinical Narrative:     CSW spoke with patient, and discussed SNF placement options.  Patient stated he has not been to rehab recently.  CSW provided choice, and he did not have a preference, CSW contacted [facility] and [facility], they both have availability.  Patient will be going to [facility], pending insurance authorization and Pass[facility] number.  CSW attempted to updated patient's daughter, left a message on voice mail awaiting for a call back.   Expected Discharge Plan: [place] Barriers to Discharge: Continued Medical Work up, SNF Pending bed offer, Ship broker  Expected Discharge Plan and Services Expected Discharge Plan: [place] Choice: [place] arrangements for the past 2 months: Single Family Home Expected Discharge Date: 10/01/19                                     Social Determinants of Health (SDOH) Interventions    Readmission Risk Interventions No flowsheet data found.

## 2019-10-01 NOTE — Progress Notes (Signed)
Central tele notified of patient's heart rate dropping down to 38 at 0313h but is non sustaining. Pt is asymptomatic but remains bradycardic in 40s-50s. On call made aware. Will continue to monitor.

## 2019-10-01 NOTE — Progress Notes (Signed)
Progress Note  Patient Name: Fernando Carr Date of Encounter: 10/01/2019  Primary Cardiologist:  Minus Breeding, MD  Subjective   Sitting up eating, not light-headed or dizzy. No palpitations or SOB Told him about the monitor, he needs to make sure they put it on him wherever he goes.   Inpatient Medications    Scheduled Meds: . carbidopa-levodopa  1 tablet Oral TID AC  . fenofibrate  54 mg Oral Daily  . heparin  5,000 Units Subcutaneous Q8H  . nicotine  14 mg Transdermal Daily  . risperiDONE  0.75 mg Oral QHS  . sertraline  100 mg Oral Daily  . simvastatin  10 mg Oral q1800   Continuous Infusions: . sodium chloride 50 mL/hr at 09/30/19 0930   PRN Meds:    Vital Signs    Vitals:   09/30/19 2129 09/30/19 2158 10/01/19 0420 10/01/19 0433  BP: (!) 159/91  134/85   Pulse: (!) 52  (!) 50   Resp: 16     Temp:  97.8 F (36.6 C)  97.6 F (36.4 C)  TempSrc:  Oral  Oral  SpO2: 97%  95%   Weight:      Height:        Intake/Output Summary (Last 24 hours) at 10/01/2019 0809 Last data filed at 09/30/2019 2158 Gross per 24 hour  Intake 905 ml  Output 1125 ml  Net -220 ml   Filed Weights   09/26/19 1652 09/27/19 [id number]  Weight: 103.4 kg 101.7 kg   Last Weight  Most recent update: 09/27/2019  6:27 AM   Weight  101.7 kg (224 lb 3.3 oz)           Weight change:    Telemetry    SR, Sinus brady 40s-50s, slowest HR while asleep, no dropped beats overnight - Personally Reviewed  ECG    None today - Personally Reviewed  Physical Exam   General: Well developed, well nourished, male in no acute distress Head: Eyes PERRLA, Head normocephalic and atraumatic Lungs: clear bilaterally to auscultation. Heart: HRRR S1 S2, without rub or gallop. No murmur. 4/4 extremity pulses are 2+ & equal. No JVD. Abdomen: Bowel sounds are present, abdomen soft and non-tender without masses or  hernias noted. Msk: weak strength and tone for age. Extremities: No clubbing, cyanosis or edema.     Skin:  No rashes or lesions noted. Neuro: Alert and oriented X 3. Tremor noted Psych:  Good affect, responds appropriately  Labs    Hematology Recent Labs  Lab 09/26/19 1705 09/27/19 0253 10/01/19 0249  WBC 5.6 5.4 4.9  RBC 4.50 4.09* 4.20*  HGB 12.9* 11.8* 12.1*  HCT 42.1 38.4* 39.2  MCV 93.6 93.9 93.3  MCH 28.7 28.9 28.8  MCHC 30.6 30.7 30.9  RDW 12.9 12.9 13.2  PLT 163 136* 121*    Chemistry Recent Labs  Lab 09/26/19 1705 09/27/19 0253 09/28/19 0312 09/28/19 0312 09/29/19 0403 09/30/19 0333 10/01/19 0249  NA 140   < > 138   < > 139 138 137  K 3.8   < > 3.7   < > 4.3 4.1 4.3  CL 107   < > 108   < > 109 105 103  CO2 27   < > 25   < > 25 27 25   GLUCOSE 93   < > 86   < > 89 107* 100*  BUN 29*   < > 28*   < > 27* 30* 37*  CREATININE 1.78*   < >  1.49*   < > 1.32* 1.44* 1.44*  CALCIUM 10.4*   < > 10.2   < > 10.3 10.4* 10.7*  PROT 7.0  --   --   --   --   --   --   ALBUMIN 3.9  --  3.4*  --  3.3* 3.3*  --   AST 21  --   --   --   --   --   --   ALT 6  --   --   --   --   --   --   ALKPHOS 66  --   --   --   --   --   --   BILITOT 0.6  --   --   --   --   --   --   GFRNONAA 36*   < > 44*   < > 51* 46* 46*  GFRAA 41*   < > 51*   < > 59* 54* 54*  ANIONGAP 6   < > 5   < > 5 6 9    < > = values in this interval not displayed.     High Sensitivity Troponin:   Recent Labs  Lab 09/26/19 1912 09/26/19 2220  TROPONINIHS 10 11      BNPNo results for input(s): BNP, PROBNP in the last 168 hours.   DDimer No results for input(s): DDIMER in the last 168 hours.   Radiology    ECHOCARDIOGRAM COMPLETE  Result Date: 09/27/2019    ECHOCARDIOGRAM REPORT   Patient Name:   CAGE HOLLEMAN Date of Exam: 09/27/2019 Medical Rec #:  [id number]   Height:       72.0 in Accession #:    [id number]  Weight:       224.2 lb Date of Birth:  08-27-1940   BSA:          2.237 m Patient Age:    4 years    BP:           86/65 mmHg Patient Gender: M           HR:           64 bpm. Exam Location:   Inpatient Procedure: 2D Echo, Color Doppler and Cardiac Doppler Indications:    R55 Syncope  History:        Patient has prior history of Echocardiogram examinations, most                 recent 03/10/2014. Abnormal ECG, Signs/Symptoms:Syncope; Risk                 Factors:Hypertension, Dyslipidemia and Current Smoker.                 Parkinson's disease.  Sonographer:    Roseanna Rainbow RDCS Referring Phys: [place]  Sonographer Comments: Technically difficult study due to poor echo windows. IMPRESSIONS  1. Cannot r/o variant of HOCM with small resting mid cavitary gradient No SAM. Left ventricular ejection fraction, by estimation, is 65 to 70%. The left ventricle has normal function. The left ventricle has no regional wall motion abnormalities. There is severe left ventricular hypertrophy. Left ventricular diastolic parameters were normal.  2. Right ventricular systolic function is normal. The right ventricular size is normal.  3. The mitral valve is normal in structure and function. No evidence of mitral valve regurgitation. No evidence of mitral stenosis.  4. The aortic valve was not well visualized. Aortic  valve regurgitation is not visualized. Mild aortic valve sclerosis is present, with no evidence of aortic valve stenosis.  5. The inferior vena cava is normal in size with greater than 50% respiratory variability, suggesting right atrial pressure of 3 mmHg. FINDINGS  Left Ventricle: Cannot r/o variant of HOCM with small resting mid cavitary gradient No SAM. Left ventricular ejection fraction, by estimation, is 65 to 70%. The left ventricle has normal function. The left ventricle has no regional wall motion abnormalities. The left ventricular internal cavity size was normal in size. There is severe left ventricular hypertrophy. Left ventricular diastolic parameters were normal. Right Ventricle: The right ventricular size is normal. No increase in right ventricular wall thickness. Right ventricular  systolic function is normal. Left Atrium: Left atrial size was normal in size. Right Atrium: Right atrial size was normal in size. Pericardium: There is no evidence of pericardial effusion. Mitral Valve: The mitral valve is normal in structure and function. Normal mobility of the mitral valve leaflets. No evidence of mitral valve regurgitation. No evidence of mitral valve stenosis. Tricuspid Valve: The tricuspid valve is normal in structure. Tricuspid valve regurgitation is not demonstrated. No evidence of tricuspid stenosis. Aortic Valve: The aortic valve was not well visualized. Aortic valve regurgitation is not visualized. Mild aortic valve sclerosis is present, with no evidence of aortic valve stenosis. Pulmonic Valve: The pulmonic valve was normal in structure. Pulmonic valve regurgitation is not visualized. No evidence of pulmonic stenosis. Aorta: The aortic root is normal in size and structure. Venous: The inferior vena cava is normal in size with greater than 50% respiratory variability, suggesting right atrial pressure of 3 mmHg. IAS/Shunts: No atrial level shunt detected by color flow Doppler.  LEFT VENTRICLE PLAX 2D LVIDd:         4.20 cm     Diastology LVIDs:         3.00 cm     LV e' lateral:   7.46 cm/s LV PW:         1.20 cm     LV E/e' lateral: 9.3 LV IVS:        1.70 cm     LV e' medial:    7.40 cm/s LVOT diam:     2.00 cm     LV E/e' medial:  9.4 LV SV:         94 LV SV Index:   42 LVOT Area:     3.14 cm  LV Volumes (MOD) LV vol d, MOD A2C: 57.6 ml LV vol d, MOD A4C: 94.0 ml LV vol s, MOD A2C: 11.8 ml LV vol s, MOD A4C: 15.8 ml LV SV MOD A2C:     45.8 ml LV SV MOD A4C:     94.0 ml LV SV MOD BP:      62.8 ml RIGHT VENTRICLE             IVC RV S prime:     15.10 cm/s  IVC diam: 2.20 cm TAPSE (M-mode): 2.5 cm LEFT ATRIUM             Index       RIGHT ATRIUM           Index LA diam:        3.10 cm 1.39 cm/m  RA Area:     16.20 cm LA Vol (A2C):   46.3 ml 20.70 ml/m RA Volume:   46.60 ml  20.84 ml/m  LA Vol (A4C):   44.5 ml 19.90  ml/m LA Biplane Vol: 45.6 ml 20.39 ml/m  AORTIC VALVE LVOT Vmax:   141.00 cm/s LVOT Vmean:  103.000 cm/s LVOT VTI:    0.300 m  AORTA Ao Root diam: 3.70 cm Ao Asc diam:  2.90 cm MITRAL VALVE MV Area (PHT): 2.53 cm    SHUNTS MV Decel Time: 300 msec    Systemic VTI:  0.30 m MV E velocity: 69.40 cm/s  Systemic Diam: 2.00 cm MV A velocity: 89.75 cm/s MV E/A ratio:  0.77 Jenkins Rouge MD Electronically signed by Jenkins Rouge MD Signature Date/Time: 09/27/2019/12:18:33 PM    Final    VAS US CAROTID  Result Date: 09/27/2019 Carotid Arterial Duplex Study Indications:       Syncope. Risk Factors:      Hyperlipidemia. Comparison Study:  no prior Performing Technologist: Abram Sander RVS  Examination Guidelines: A complete evaluation includes B-mode imaging, spectral Doppler, color Doppler, and power Doppler as needed of all accessible portions of each vessel. Bilateral testing is considered an integral part of a complete examination. Limited examinations for reoccurring indications may be performed as noted.  Right Carotid Findings: +----------+--------+--------+--------+------------------+--------+           PSV cm/sEDV cm/sStenosisPlaque DescriptionComments +----------+--------+--------+--------+------------------+--------+ CCA Prox  79      17              heterogenous               +----------+--------+--------+--------+------------------+--------+ CCA Distal61      14              heterogenous               +----------+--------+--------+--------+------------------+--------+ ICA Prox  69      19      1-39%   heterogenous               +----------+--------+--------+--------+------------------+--------+ ICA Distal103     19                                         +----------+--------+--------+--------+------------------+--------+ ECA       116     17                                          +----------+--------+--------+--------+------------------+--------+ +----------+--------+-------+--------+-------------------+           PSV cm/sEDV cmsDescribeArm Pressure (mmHG) +----------+--------+-------+--------+-------------------+ [id number]                                         +----------+--------+-------+--------+-------------------+ +---------+--------+--+--------+--+---------+ VertebralPSV cm/s55EDV cm/s16Antegrade +---------+--------+--+--------+--+---------+  Left Carotid Findings: +----------+--------+--------+--------+------------------+--------+           PSV cm/sEDV cm/sStenosisPlaque DescriptionComments +----------+--------+--------+--------+------------------+--------+ CCA Prox  87      17              heterogenous               +----------+--------+--------+--------+------------------+--------+ CCA Distal55      10              heterogenous               +----------+--------+--------+--------+------------------+--------+ ICA Prox  68      23      1-39%  heterogenous               +----------+--------+--------+--------+------------------+--------+ ICA Distal93      27                                         +----------+--------+--------+--------+------------------+--------+ ECA       180     24                                         +----------+--------+--------+--------+------------------+--------+ +----------+--------+--------+--------+-------------------+           PSV cm/sEDV cm/sDescribeArm Pressure (mmHG) +----------+--------+--------+--------+-------------------+ [id number]                                          +----------+--------+--------+--------+-------------------+ +---------+--------+--+--------+--+---------+ VertebralPSV cm/s41EDV cm/s12Antegrade +---------+--------+--+--------+--+---------+   Summary: Right Carotid: Velocities in the right ICA are consistent with a 1-39% stenosis. Left  Carotid: Velocities in the left ICA are consistent with a 1-39% stenosis. Vertebrals: Bilateral vertebral arteries demonstrate antegrade flow. *See table(s) above for measurements and observations.  Electronically signed by Servando Snare MD on 09/27/2019 at 11:56:09 AM.    Final      Cardiac Studies   CAROTID DOPPLERS: 09/27/2019 Bilateral 1-39% stenosis Vertebral artery w/ antegrade flow  ECHO:  09/27/2019 1. Cannot r/o variant of HOCM with small resting mid cavitary gradient No  SAM. Left ventricular ejection fraction, by estimation, is 65 to 70%. The  left ventricle has normal function. The left ventricle has no regional  wall motion abnormalities. There  is severe left ventricular hypertrophy. Left ventricular diastolic  parameters were normal.  2. Right ventricular systolic function is normal. The right ventricular  size is normal.  3. The mitral valve is normal in structure and function. No evidence of  mitral valve regurgitation. No evidence of mitral stenosis.  4. The aortic valve was not well visualized. Aortic valve regurgitation  is not visualized. Mild aortic valve sclerosis is present, with no  evidence of aortic valve stenosis.  5. The inferior vena cava is normal in size with greater than 50%  respiratory variability, suggesting right atrial pressure of 3 mmHg.   Patient Profile     79 y.o. male w/ hx  bradycardia, hypotension, SVT, HLD, AAA w/ repair 2015, tob use, bipolar d/o and Parkinson's disease was admitted 02/25 with syncope, cards saw 02/28.   Assessment & Plan    1. Syncope - no cause found on MRI, CT, carotids - possible dehydration, along w/ chronic autonomic dysfunction from Parkinson's dz, plus med side effect from Risperdal may have combined to cause the syncope - pt has bradycardia, but no evidence of chronotropic incompetence and HR not sustained at a low enough level to cause loss of consciousness (even while asleep) - will ck monitor, ordered,  it will be sent to whatever facility he goes to, and be applied there. - no driving for 6 months. - need to make sure he understands that orthostatic dizziness may be an ongoing issue, he needs to be careful getting up out of bed or from a chair.  2. abnl ECHO - Dr Audie Box reviewed and does not feel LVH is severe, no HOCM - decision on PYP scan, +/-  cardiac MRI can be made as outpt - maintain hydration  3. Bradycardia - lowest HR are while asleep. - he had some Mobitz I while asleep, with dropped QRS's, causing HR to be briefly in the 30s, not sustained - HR increased to 80 w/ ambulation - no rate-lowering rx  4. CKD - admit BUN/Cr 29/1.78 - improved, but still elevated - BUN trending up despite IVF x 24 hr at 50 cc/hr - will leave to IM  Otherwise, per IM, no further cardiac inpatient eval Active Problems:   Syncope and collapse    Jonetta Speak , PA-C 8:09 AM 10/01/2019 Pager: [phone]

## 2019-10-02 DIAGNOSIS — F411 Generalized anxiety disorder: Secondary | ICD-10-CM

## 2019-10-02 DIAGNOSIS — F319 Bipolar disorder, unspecified: Secondary | ICD-10-CM

## 2019-10-02 LAB — BASIC METABOLIC PANEL
Anion gap: 5 (ref 5–15)
BUN: 44 mg/dL — ABNORMAL HIGH (ref 8–23)
CO2: 26 mmol/L (ref 22–32)
Calcium: 10.5 mg/dL — ABNORMAL HIGH (ref 8.9–10.3)
Chloride: 106 mmol/L (ref 98–111)
Creatinine, Ser: 1.68 mg/dL — ABNORMAL HIGH (ref 0.61–1.24)
GFR calc Af Amer: 44 mL/min — ABNORMAL LOW (ref >60)
GFR calc non Af Amer: 38 mL/min — ABNORMAL LOW (ref >60)
Glucose, Bld: 103 mg/dL — ABNORMAL HIGH (ref 70–99)
Potassium: 4.3 mmol/L (ref 3.5–5.1)
Sodium: 137 mmol/L (ref 135–145)

## 2019-10-02 LAB — GLUCOSE, CAPILLARY
Glucose-Capillary: 82 mg/dL (ref 70–99)
Glucose-Capillary: 88 mg/dL (ref 70–99)
Glucose-Capillary: 103 mg/dL — ABNORMAL HIGH (ref 70–99)
Glucose-Capillary: 92 mg/dL (ref 70–99)
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
## 2019-10-03 LAB — SARS CORONAVIRUS 2 (TAT 6-24 HRS): SARS Coronavirus 2: NEGATIVE

## 2019-10-03 LAB — GLUCOSE, CAPILLARY
Glucose-Capillary: 80 mg/dL (ref 70–99)
Glucose-Capillary: 81 mg/dL (ref 70–99)
Glucose-Capillary: 82 mg/dL (ref 70–99)
Glucose-Capillary: 103 mg/dL — ABNORMAL HIGH (ref 70–99)

## 2019-10-03 NOTE — Care Management Important Message (Signed)
Important Message  Patient Details IM Letter given to Evette Cristal SW Case Manager to present to the Patient Name: Fernando Carr MRN: [id number] Date of Birth: [birth year]   Medicare Important Message Given:  Yes     Kerin Salen 10/03/2019, 10:09 AM

## 2019-10-03 NOTE — TOC Progression Note (Addendum)
Transition of Care [facility]) - Progression Note    Patient Details  Name: Fernando Carr MRN: [id number] Date of Birth: [birth year]  [facility]) CM/SW Contact  Ross Ludwig, [place] Phone Number: 10/03/2019, 12:38 PM  Clinical Narrative:     CSW received phone call from SNF, [employer] denied patient and are offering a peer to peer option.  CSW spoke to physician and requested that he complete a peer to peer.  Physician will contact insurance company for peer to peer.  CSW awaiting decision by insurance company peer to peer.  Patient's Passar number is still pending as well.  CSW to continue to follow patient's progress throughout discharge planning.  2:00pm  CSW received notification from physician completed peer to peer and [facility], would like an updated PT note faxed to them so they can review again.  CSW contacted insurance company to find out fax number, which is [phone] reference number [id number].  CSW contacted Tammy at [facility] and asked her to fax upated PT note to insurance company.  CSW also requested that PT see patient again today.  3:15pm  CSW spoke to Tammy at [facility], and she is sending today's PT notes and yesterday's to insurance company to review.  Awaiting insurance authorization and Passar number, CSW updated patient and physician.   Expected Discharge Plan: [place] Barriers to Discharge: Continued Medical Work up, SNF Pending bed offer, Ship broker  Expected Discharge Plan and Services Expected Discharge Plan: [place] Choice: [place] arrangements for the past 2 months: Single Family Home Expected Discharge Date: 10/01/19                                     Social Determinants of Health (SDOH) Interventions    Readmission Risk Interventions No flowsheet data found.

## 2019-10-03 NOTE — TOC Progression Note (Signed)
Transition of Care [facility]) - Progression Note    Patient Details  Name: MIKE DAFT MRN: [id number] Date of Birth: 12-10-1940  Transition of Care [facility]) CM/SW Contact  Purcell Mouton, RN Phone Number: 10/03/2019, 9:14 AM  Clinical Narrative:    Rosalie Gums is not back also Insurance authorization is not back. Will continue to follow.   Expected Discharge Plan: [place] Barriers to Discharge: Continued Medical Work up, SNF Pending bed offer, Ship broker  Expected Discharge Plan and Services Expected Discharge Plan: [place] Choice: [place] arrangements for the past 2 months: Single Family Home Expected Discharge Date: 10/01/19                                     Social Determinants of Health (SDOH) Interventions    Readmission Risk Interventions No flowsheet data found.
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
## 2019-10-04 ENCOUNTER — Ambulatory Visit: Payer: Self-pay

## 2019-10-04 LAB — GLUCOSE, CAPILLARY
Glucose-Capillary: 88 mg/dL (ref 70–99)
Glucose-Capillary: 91 mg/dL (ref 70–99)
Glucose-Capillary: 94 mg/dL (ref 70–99)
Glucose-Capillary: 95 mg/dL (ref 70–99)
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
## 2019-10-05 LAB — GLUCOSE, CAPILLARY
Glucose-Capillary: 80 mg/dL (ref 70–99)
Glucose-Capillary: 94 mg/dL (ref 70–99)
Glucose-Capillary: 84 mg/dL (ref 70–99)
Glucose-Capillary: 101 mg/dL — ABNORMAL HIGH (ref 70–99)
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
## 2019-10-06 LAB — GLUCOSE, CAPILLARY
Glucose-Capillary: 72 mg/dL (ref 70–99)
Glucose-Capillary: 129 mg/dL — ABNORMAL HIGH (ref 70–99)
Glucose-Capillary: 106 mg/dL — ABNORMAL HIGH (ref 70–99)
Glucose-Capillary: 87 mg/dL (ref 70–99)

## 2019-10-06 LAB — BASIC METABOLIC PANEL
Anion gap: 6 (ref 5–15)
BUN: 46 mg/dL — ABNORMAL HIGH (ref 8–23)
CO2: 27 mmol/L (ref 22–32)
Calcium: 10.8 mg/dL — ABNORMAL HIGH (ref 8.9–10.3)
Chloride: 101 mmol/L (ref 98–111)
Creatinine, Ser: 1.7 mg/dL — ABNORMAL HIGH (ref 0.61–1.24)
GFR calc Af Amer: 44 mL/min — ABNORMAL LOW (ref >60)
GFR calc non Af Amer: 38 mL/min — ABNORMAL LOW (ref >60)
Glucose, Bld: 84 mg/dL (ref 70–99)
Potassium: 5.2 mmol/L — ABNORMAL HIGH (ref 3.5–5.1)
Sodium: 134 mmol/L — ABNORMAL LOW (ref 135–145)

## 2019-10-06 MED ORDER — SODIUM ZIRCONIUM CYCLOSILICATE 5 G PO PACK
5.0000 g | PACK | Freq: Once | ORAL | Status: AC
  Administered 2019-10-06: 15:00:00 5 g via ORAL
  Filled 2019-10-06: qty 1
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
## 2019-10-07 LAB — RESPIRATORY PANEL BY RT PCR (FLU A&B, COVID)
Influenza A by PCR: NEGATIVE
Influenza B by PCR: NEGATIVE
SARS Coronavirus 2 by RT PCR: NEGATIVE

## 2019-10-07 LAB — GLUCOSE, CAPILLARY
Glucose-Capillary: 84 mg/dL (ref 70–99)
Glucose-Capillary: 143 mg/dL — ABNORMAL HIGH (ref 70–99)

## 2019-10-07 NOTE — TOC Progression Note (Addendum)
Transition of Care [facility]) - Progression Note    Patient Details  Name: Fernando Carr MRN: [id number] Date of Birth: September 17, 1940  [facility] CM/SW Contact  Ross Ludwig, [place] Phone Number: 10/07/2019, 11:23 AM  Clinical Narrative:     CSW received phone call from [place] that patient appeal has been successful and patient has been approved by [facility] after initially being denied.  [facility] can accept patient pending negative covid test, CSW spoke to physicians and a rapid test has been approved for patient.  CSW to facilitate discharge planning to SNF, CSW updated patient and his daughter [name] [phone].   Expected Discharge Plan: [place] Barriers to Discharge: Continued Medical Work up, SNF Pending bed offer, Ship broker  Expected Discharge Plan and Services Expected Discharge Plan: [place] Choice: [place] arrangements for the past 2 months: Single Family Home Expected Discharge Date: 10/01/19                                     Social Determinants of Health (SDOH) Interventions    Readmission Risk Interventions No flowsheet data found.

## 2019-10-07 NOTE — Discharge Summary (Signed)
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 his/her office.   In some cases, they will be blood work, cultures and biopsy results pending at the time of your discharge. Please request that your primary care M.D. goes through all the records of your hospital data and follows up on these results.   Also Note the following: If you experience worsening of your admission symptoms, develop shortness of breath, life threatening emergency, suicidal or homicidal thoughts you must seek medical attention immediately by calling 911 or calling your MD immediately  if symptoms less severe.   You must read complete instructions/literature along with all the possible adverse reactions/side effects  for all the Medicines you take and that have been prescribed to you. Take any new Medicines after you have completely understood and accpet all the possible adverse reactions/side effects.    Do not drive when taking Pain medications or sleeping medications (Benzodaizepines)   Do not take more than prescribed Pain, Sleep and Anxiety Medications. It is not advisable to combine anxiety,sleep and pain medications without talking with your primary care practitioner   Special Instructions: If you have smoked or chewed Tobacco  in the last 2 yrs please stop smoking, stop any regular Alcohol  and or any Recreational drug use.   Wear Seat belts while driving.   Please note: You were cared for by a hospitalist during your hospital stay. Once you are discharged, your primary care physician will handle any further medical issues. Please note that NO REFILLS for any discharge medications will be authorized once you are discharged, as it is imperative that you return to your primary care physician (or establish a relationship with a primary care physician if you do not have one) for your post hospital discharge needs so that they can reassess your need for medications and monitor your lab values.  Time coordinating discharge: 40 minutes  SIGNED:  Marzetta Board, MD, PhD 10/07/2019, 10:55 AM

## 2019-10-07 NOTE — Care Management Important Message (Signed)
Important Message  Patient Details IM Letter given to Evette Cristal SW Case Manager to present to the Patient Name: Fernando Carr MRN: [id number] Date of Birth: [birth year]   Medicare Important Message Given:  Yes     Kerin Salen 10/07/2019, 11:53 AM

## 2019-10-07 NOTE — TOC Transition Note (Addendum)
Transition of Care [facility]) - CM/SW Discharge Note   Patient Details  Name: LEMARCUS CHRISTINE MRN: [id number] Date of Birth: 1941-06-28  Transition of Care [facility]) CM/SW Contact:  Ross Ludwig, LCSW Phone Number: 10/07/2019, 2:40 PM   Clinical Narrative:     Patient to be d/c'ed today to [facility] room 105.  Patient and family agreeable to plans will transport via ems RN to call report [phone].  Patient's daughter was notified that patient is discharing today.  Daughter requested that she is notified by RN once EMS arrives.   Final next level of care: Skilled Nursing Facility Barriers to Discharge: Barriers Resolved   Patient Goals and CMS Choice Patient states their goals for this hospitalization and ongoing recovery are:: To go to SNF for short term rehab, and then return back home. CMS Medicare.gov Compare Post Acute Care list provided to:: Patient Choice offered to / list presented to : Patient  Discharge Placement PASRR number recieved: 10/04/19            Patient chooses bed at: Other - please specify in the comment section below:([facility]) Patient to be transferred to facility by: [facility] Name of family member notified: Daughter [name] [phone] Patient and family notified of of transfer: 10/07/19  Discharge Plan and Services     Post Acute Care Choice: [place]          DME Arranged: N/A         HH Arranged: NA          Social Determinants of Health (SDOH) Interventions     Readmission Risk Interventions No flowsheet data found.
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
## 2019-10-15 ENCOUNTER — Other Ambulatory Visit (HOSPITAL_COMMUNITY): Payer: Self-pay | Admitting: Psychiatry

## 2019-10-15 ENCOUNTER — Ambulatory Visit: Payer: Medicare HMO | Attending: Internal Medicine

## 2019-10-15 DIAGNOSIS — F319 Bipolar disorder, unspecified: Secondary | ICD-10-CM

## 2019-10-15 DIAGNOSIS — F411 Generalized anxiety disorder: Secondary | ICD-10-CM

## 2019-10-15 DIAGNOSIS — Z23 Encounter for immunization: Secondary | ICD-10-CM

## 2019-10-15 NOTE — Progress Notes (Signed)
   Covid-19 Vaccination Clinic  Name:  KVON TRUNDY    MRN: [id number] DOB: [birth year]  10/15/2019  Mr. Horiuchi was observed post Covid-19 immunization for 15 minutes without incident. He was provided with Vaccine Information Sheet and instruction to access the V-Safe system.   Mr. Garguilo was instructed to call 911 with any severe reactions post vaccine: Marland Kitchen Difficulty breathing  . Swelling of face and throat  . A fast heartbeat  . A bad rash all over body  . Dizziness and weakness   Immunizations Administered    Name Date Dose VIS Date Route   Pfizer COVID-19 Vaccine 10/15/2019  4:03 PM 0.3 mL 07/12/2019 Intramuscular   Manufacturer: [place]   Lot: [id number]   [place]: [id number]

## 2019-10-18 ENCOUNTER — Ambulatory Visit (INDEPENDENT_AMBULATORY_CARE_PROVIDER_SITE_OTHER): Payer: Medicare HMO

## 2019-10-18 DIAGNOSIS — R55 Syncope and collapse: Secondary | ICD-10-CM | POA: Diagnosis not present

## 2019-10-29 ENCOUNTER — Telehealth (HOSPITAL_COMMUNITY): Payer: Self-pay

## 2019-10-29 DIAGNOSIS — F319 Bipolar disorder, unspecified: Secondary | ICD-10-CM

## 2019-10-29 MED ORDER — RISPERIDONE 0.5 MG PO TABS: 0.5000 mg | ORAL_TABLET | Freq: Every day | ORAL | 1 refills | Status: DC

## 2019-10-29 NOTE — Telephone Encounter (Signed)
His dose change from recent hospitalization at [facility]. I send new prescription to [facility].

## 2019-10-29 NOTE — Telephone Encounter (Signed)
Received fax from [facility] on [address] requesting 90 day supply of risperidone 0.25mg . Last filled 10/01/19 with 9 tablets ordered. Next appt is 12/03/19. Please advise.

## 2019-11-08 ENCOUNTER — Ambulatory Visit: Payer: Medicare HMO | Admitting: Cardiology

## 2019-11-11 ENCOUNTER — Encounter: Payer: Self-pay | Admitting: Cardiology

## 2019-11-14 DIAGNOSIS — Z7189 Other specified counseling: Secondary | ICD-10-CM | POA: Insufficient documentation

## 2019-11-14 DIAGNOSIS — I517 Cardiomegaly: Secondary | ICD-10-CM | POA: Insufficient documentation

## 2019-11-14 NOTE — Progress Notes (Signed)
Cardiology Office Note   Date:  11/15/2019   ID:  Fernando Carr, DOB 03-Apr-1941, MRN [id number]  PCP:  [name], MD  Cardiologist:   Minus Breeding, MD   Chief Complaint  Patient presents with  . Atrial Fibrillation      History of Present Illness: Fernando Carr is a 79 y.o. male who presents for follow up of syncope.  I saw him in the hospital in Feb for this.  He had an echo with some suggestion of septal hypertrophy.  MRI and CT of the brain demonstrated no acute findings.  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  He had previously had a history of SVT.  He had no carotid stenosis.    Since I last saw him he has had no further syncope.  He denies any chest pressure, neck or arm discomfort.  He is not noticed any palpitations.  He has had no weight gain or edema.  He did wear a monitor.  There is evidence of paroxysmal atrial fibrillation on the monitor.  There is significant artifact which makes it somewhat difficult to interpret but there is irregularity to make this diagnosis.  He does not feel this.   Past Medical History:  Diagnosis Date  . AAA (abdominal aortic aneurysm) ([place])   . Bipolar disorder ([place])   . Depression   . High cholesterol   . Parkinson's disease ([place])   . Renal insufficiency    chronic renal   . Resting tremor   . Suicide attempt ([name]) aug. 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 ABDOMINAL AORTIC ANEURYSM REPAIR N/A 07/07/2014   Procedure: ABDOMINAL AORTIC ANEURYSM REPAIR;  Surgeon: Rosetta Posner, MD;  Location: [facility] OR;  Service: Vascular;  Laterality: N/A;  . KNEE ARTHROSCOPY Left 1972     Current Outpatient Medications  Medication Sig Dispense Refill  . carbidopa-levodopa (SINEMET IR) 25-100 MG tablet Take 1 tablet by mouth 3 (three) times daily before meals. 270 tablet 4  . fenofibrate 54 MG tablet Take 54 mg by mouth daily.     . risperiDONE (RISPERDAL) 0.5 MG tablet Take 1 tablet  (0.5 mg total) by mouth at bedtime. 30 tablet 1  . sertraline (ZOLOFT) 100 MG tablet TAKE 1 TABLET BY MOUTH DAILY FOR DEPRESSION 4 tablet 0  . simvastatin (ZOCOR) 10 MG tablet Take 1 tablet (10 mg total) by mouth daily at 6 PM.    . apixaban (ELIQUIS) 5 MG TABS tablet Take 1 tablet (5 mg total) by mouth 2 (two) times daily. 60 tablet 11   No current facility-administered medications for this visit.    Allergies:   Patient has no known allergies.    ROS:  Please see the history of present illness.   Otherwise, review of systems are positive for none.   All other systems are reviewed and negative.    PHYSICAL EXAM: VS:  BP 136/72   Pulse 74   Ht 6' (1.829 m)   Wt 233 lb (105.7 kg)   SpO2 98%   BMI 31.60 kg/m  , BMI Body mass index is 31.6 kg/m. GENERAL:  Well appearing NECK:  No jugular venous distention, waveform within normal limits, carotid upstroke brisk and symmetric, no bruits, no thyromegaly LUNGS:  Clear to auscultation bilaterally CHEST:  Unremarkable HEART:  PMI not displaced or sustained,S1 and S2 within normal limits, no S3, no S4, no clicks, no  rubs, no murmurs ABD:  Flat, positive bowel sounds normal in frequency in pitch, no bruits, no rebound, no guarding, no midline pulsatile mass, no hepatomegaly, no splenomegaly EXT:  2 plus pulses throughout, no edema, no cyanosis no clubbing   EKG:  EKG is not ordered today.    Recent Labs: 09/26/2019: ALT 6; Magnesium 2.1; TSH 0.468 10/01/2019: Hemoglobin 12.1; Platelets 121 10/06/2019: BUN 46; Creatinine, Ser 1.70; Potassium 5.2; Sodium 134    Lipid Panel No results found for: CHOL, TRIG, HDL, CHOLHDL, VLDL, LDLCALC, LDLDIRECT    Wt Readings from Last 3 Encounters:  11/15/19 233 lb (105.7 kg)  09/27/19 224 lb 3.3 oz (101.7 kg)  04/09/19 234 lb 6.4 oz (106.3 kg)      Other studies Reviewed: Additional studies/ records that were reviewed today include: Event monitor. Review of the above records demonstrates:   Please see elsewhere in the note.     ASSESSMENT AND PLAN:  SYNCOPE:    He has had no further syncope.  The etiology of this is not clear.  There were no bradycardia arrhythmias on the monitor.  ATRIAL FIB: This is a new diagnosis.  He has no contraindication anticoagulation. Fernando Carr has a CHA2DS2 - VASc score of 2.  He has no evidence of GI bleeding.  He does not fall.  He was very mildly anemic in the hospital and I will check a CBC and basic metabolic profile today.  He will be started on Eliquis.  He needs to have dental surgery and he can have this prior to starting the Eliquis.Marland Kitchen   LVH: He had severe LVH on echo.  In the start of the PYP scan and have a low threshold for MRI.  HTN: Blood pressure is controlled.  No change in therapy.  CKD IIIA: He does have some mild renal insufficiency and I will follow the creatinine.  He thinks he was dehydrated when this was drawn.   Current medicines are reviewed at length with the patient today.  The patient does not have concerns regarding medicines.  The following changes have been made:  no change  Labs/ tests ordered today include:   Orders Placed This Encounter  Procedures  . CBC  . Basic metabolic panel  . MYOCARDIAL AMYLOID IMAGING PLANAR AND SPECT     Disposition:   FU with me in one month.     Signed, Minus Breeding, MD  11/15/2019 9:37 AM    [facility]

## 2019-11-15 ENCOUNTER — Other Ambulatory Visit: Payer: Self-pay

## 2019-11-15 ENCOUNTER — Encounter: Payer: Self-pay | Admitting: Cardiology

## 2019-11-15 ENCOUNTER — Ambulatory Visit (INDEPENDENT_AMBULATORY_CARE_PROVIDER_SITE_OTHER): Payer: Medicare HMO | Admitting: Cardiology

## 2019-11-15 VITALS — BP 136/72 | HR 74 | Ht 72.0 in | Wt 233.0 lb

## 2019-11-15 DIAGNOSIS — I10 Essential (primary) hypertension: Secondary | ICD-10-CM

## 2019-11-15 DIAGNOSIS — I517 Cardiomegaly: Secondary | ICD-10-CM

## 2019-11-15 DIAGNOSIS — R55 Syncope and collapse: Secondary | ICD-10-CM

## 2019-11-15 DIAGNOSIS — I4891 Unspecified atrial fibrillation: Secondary | ICD-10-CM

## 2019-11-15 DIAGNOSIS — Z7189 Other specified counseling: Secondary | ICD-10-CM | POA: Diagnosis not present

## 2019-11-15 LAB — BASIC METABOLIC PANEL
BUN/Creatinine Ratio: 22 (ref 10–24)
BUN: 32 mg/dL — ABNORMAL HIGH (ref 8–27)
CO2: 23 mmol/L (ref 20–29)
Calcium: 10.8 mg/dL — ABNORMAL HIGH (ref 8.6–10.2)
Chloride: 104 mmol/L (ref 96–106)
Creatinine, Ser: 1.45 mg/dL — ABNORMAL HIGH (ref 0.76–1.27)
GFR calc Af Amer: 53 mL/min/{1.73_m2} — ABNORMAL LOW (ref >59)
GFR calc non Af Amer: 46 mL/min/{1.73_m2} — ABNORMAL LOW (ref >59)
Glucose: 63 mg/dL — ABNORMAL LOW (ref 65–99)
Potassium: 4.6 mmol/L (ref 3.5–5.2)
Sodium: 142 mmol/L (ref 134–144)

## 2019-11-15 LAB — CBC
Hematocrit: 38.1 % (ref 37.5–51.0)
Hemoglobin: 12.6 g/dL — ABNORMAL LOW (ref 13.0–17.7)
MCH: 28.9 pg (ref 26.6–33.0)
MCHC: 33.1 g/dL (ref 31.5–35.7)
MCV: 87 fL (ref 79–97)
Platelets: 192 10*3/uL (ref 150–450)
RBC: 4.36 x10E6/uL (ref 4.14–5.80)
RDW: 12.8 % (ref 11.6–15.4)
WBC: 5.2 10*3/uL (ref 3.4–10.8)

## 2019-11-15 MED ORDER — APIXABAN 5 MG PO TABS: 5.0000 mg | ORAL_TABLET | Freq: Two times a day (BID) | ORAL | 11 refills | Status: DC

## 2019-11-15 NOTE — Patient Instructions (Addendum)
Medication Instructions:  START ELIQUIS TWICE A DAY *If you need a refill on your cardiac medications before your next appointment, please call your pharmacy*  Lab Work: Your physician recommends that you return for lab work TODAY (CBC, BMP) If you have labs (blood work) drawn today and your tests are completely normal, you will receive your results only by: Marland Kitchen MyChart Message (if you have MyChart) OR . A paper copy in the mail If you have any lab test that is abnormal or we need to change your treatment, we will call you to review the results.  Testing/Procedures: AMYLOID SCAN AT [address]  Follow-Up: At [facility], you and your health needs are our priority.  As part of our continuing mission to provide you with exceptional heart care, we have created designated Provider Care Teams.  These Care Teams include your primary Cardiologist (physician) and Advanced Practice Providers (APPs -  Physician Assistants and Nurse Practitioners) who all work together to provide you with the care you need, when you need it.  Your next appointment:   1 month(s)  The format for your next appointment:   In Person  Provider:   Minus Breeding, MD

## 2019-11-21 ENCOUNTER — Telehealth (HOSPITAL_COMMUNITY): Payer: Self-pay

## 2019-11-21 NOTE — Telephone Encounter (Signed)
Encounter complete. 

## 2019-11-22 ENCOUNTER — Ambulatory Visit: Payer: Medicare HMO | Admitting: Cardiology

## 2019-11-22 ENCOUNTER — Telehealth (HOSPITAL_COMMUNITY): Payer: Self-pay

## 2019-11-22 NOTE — Telephone Encounter (Signed)
Encounter complete. 

## 2019-11-26 ENCOUNTER — Ambulatory Visit (HOSPITAL_COMMUNITY)
Admission: RE | Admit: 2019-11-26 | Discharge: 2019-11-26 | Disposition: A | Discharge status: Home / Self Care | Payer: Medicare HMO | Source: Ambulatory Visit | Attending: Cardiovascular Disease | Admitting: Cardiovascular Disease

## 2019-11-26 ENCOUNTER — Other Ambulatory Visit: Payer: Self-pay

## 2019-11-26 DIAGNOSIS — I517 Cardiomegaly: Secondary | ICD-10-CM | POA: Diagnosis present

## 2019-11-26 MED ORDER — TECHNETIUM TC 99M PYROPHOSPHATE
20.4000 | Freq: Once | INTRAVENOUS | Status: AC
  Administered 2019-11-26: 20.4 via INTRAVENOUS

## 2019-11-27 ENCOUNTER — Encounter: Payer: Self-pay | Admitting: *Deleted

## 2019-11-30 ENCOUNTER — Other Ambulatory Visit (HOSPITAL_COMMUNITY): Payer: Self-pay | Admitting: Psychiatry

## 2019-11-30 DIAGNOSIS — F319 Bipolar disorder, unspecified: Secondary | ICD-10-CM

## 2019-12-02 ENCOUNTER — Other Ambulatory Visit: Payer: Self-pay

## 2019-12-02 ENCOUNTER — Ambulatory Visit (INDEPENDENT_AMBULATORY_CARE_PROVIDER_SITE_OTHER): Payer: Medicare HMO | Admitting: Diagnostic Neuroimaging

## 2019-12-02 ENCOUNTER — Encounter: Payer: Self-pay | Admitting: Diagnostic Neuroimaging

## 2019-12-02 VITALS — BP 156/81 | HR 55 | Temp 96.9°F | Ht 72.0 in | Wt 233.2 lb

## 2019-12-02 DIAGNOSIS — G20 Parkinson's disease: Secondary | ICD-10-CM | POA: Diagnosis not present

## 2019-12-02 DIAGNOSIS — R55 Syncope and collapse: Secondary | ICD-10-CM | POA: Diagnosis not present
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
## 2019-12-03 ENCOUNTER — Telehealth (INDEPENDENT_AMBULATORY_CARE_PROVIDER_SITE_OTHER): Payer: Medicare HMO | Admitting: Psychiatry

## 2019-12-03 ENCOUNTER — Other Ambulatory Visit: Payer: Self-pay

## 2019-12-03 ENCOUNTER — Encounter (HOSPITAL_COMMUNITY): Payer: Self-pay | Admitting: Psychiatry

## 2019-12-03 DIAGNOSIS — F411 Generalized anxiety disorder: Secondary | ICD-10-CM

## 2019-12-03 DIAGNOSIS — F319 Bipolar disorder, unspecified: Secondary | ICD-10-CM

## 2019-12-03 MED ORDER — SERTRALINE HCL 100 MG PO TABS: ORAL_TABLET | ORAL | 0 refills | Status: DC

## 2019-12-03 MED ORDER — QUETIAPINE FUMARATE 25 MG PO TABS: ORAL_TABLET | ORAL | 1 refills | Status: DC

## 2019-12-03 NOTE — Progress Notes (Signed)
Virtual Visit via Telephone Note  I connected with Fernando Carr on 12/03/19 at  9:20 AM EDT by telephone and verified that I am speaking with the correct person using two identifiers.   I discussed the limitations, risks, security and privacy concerns of performing an evaluation and management service by telephone and the availability of in person appointments. I also discussed with the patient that there may be a patient responsible charge related to this service. The patient expressed understanding and agreed to proceed.   History of Present Illness: Patient is evaluated by phone session.  He was recently admitted in the hospital after having a syncopal episode.  He also saw neurologist recently who recommended not to drive because of having a syncopal episode.  But patient is not happy as he feels that he is able to drive.  However he is waiting from [facility] to get clearance.  He is taking Risperdal 0.5 mg which was reduced from 0.75 when he was admitted in the hospital.  He has tremors and diagnosed with idiopathic Parkinson.  He reported his relationship with his daughter is much better after the recent hospitalization.  He stated 1 week at her daughter's house.  He is also helps grandchildren some time when her daughter needs them.  Denies any paranoia, suicidal thoughts or homicidal thoughts.  Denies any crying spells.  He denies any impulsive behavior or mania but admitted some time he worried about his health.  He rationalize his last syncopal episode because of dehydration.  He is taking all his medication other than Eliquis because he is scared from the medication.  Denies drinking or using any illegal substances.  He does not feel his tremors are getting better.  He admitted some time balance issues but they are not new.  His appetite is okay.  His energy level is fair.  Past Psychiatric History:Reviewed. H/Osuicidal attemptbyinjectingEthyline Glycol. Had acute renal failure and required  dialysis at that time. H/O paranoia and mania with impulsive buying of expensive cars and selling property when depressed. Tried Abilify and Lexapro. However he had a good response with Risperdal.   Recent Results (from the past 2160 hour(s))  Comprehensive metabolic panel     Status: Abnormal   Collection Time: 09/26/19  5:05 PM  Result Value Ref Range   Sodium 140 135 - 145 mmol/L   Potassium 3.8 3.5 - 5.1 mmol/L   Chloride 107 98 - 111 mmol/L   CO2 27 22 - 32 mmol/L   Glucose, Bld 93 70 - 99 mg/dL    Comment: Glucose reference range applies only to samples taken after fasting for at least 8 hours.   BUN 29 (H) 8 - 23 mg/dL   Creatinine, Ser 1.78 (H) 0.61 - 1.24 mg/dL   Calcium 10.4 (H) 8.9 - 10.3 mg/dL   Total Protein 7.0 6.5 - 8.1 g/dL   Albumin 3.9 3.5 - 5.0 g/dL   AST 21 15 - 41 U/L   ALT 6 0 - 44 U/L   Alkaline Phosphatase 66 38 - 126 U/L   Total Bilirubin 0.6 0.3 - 1.2 mg/dL   GFR calc non Af Amer 36 (L) >60 mL/min   GFR calc Af Amer 41 (L) >60 mL/min   Anion gap 6 5 - 15    Comment: Performed at [facility], [place] [address], [place], [place] [address]  CBC with Differential     Status: Abnormal   Collection Time: 09/26/19  5:05 PM  Result  Value Ref Range   WBC 5.6 4.0 - 10.5 K/uL   RBC 4.50 4.22 - 5.81 MIL/uL   Hemoglobin 12.9 (L) 13.0 - 17.0 g/dL   HCT 42.1 39.0 - 52.0 %   MCV 93.6 80.0 - 100.0 fL   MCH 28.7 26.0 - 34.0 pg   MCHC 30.6 30.0 - 36.0 g/dL   RDW 12.9 11.5 - 15.5 %   Platelets 163 150 - 400 K/uL   nRBC 0.0 0.0 - 0.2 %   Neutrophils Relative % 76 %   Neutro Abs 4.2 1.7 - 7.7 K/uL   Lymphocytes Relative 15 %   Lymphs Abs 0.8 0.7 - 4.0 K/uL   Monocytes Relative 9 %   Monocytes Absolute 0.5 0.1 - 1.0 K/uL   Eosinophils Relative 0 %   Eosinophils Absolute 0.0 0.0 - 0.5 K/uL   Basophils Relative 0 %   Basophils Absolute 0.0 0.0 - 0.1 K/uL   Immature Granulocytes 0 %   Abs Immature Granulocytes 0.02 0.00 - 0.07 K/uL    Comment:  Performed at [facility], [place] [address] [place], [place] [address]  Troponin I (High Sensitivity)     Status: None   Collection Time: 09/26/19  7:12 PM  Result Value Ref Range   Troponin I (High Sensitivity) 10 <18 ng/L    Comment: (NOTE) Elevated high sensitivity troponin I (hsTnI) values and significant  changes across serial measurements may suggest ACS but many other  chronic and acute conditions are known to elevate hsTnI results.  Refer to the "Links" section for chest pain algorithms and additional  guidance. Performed at [facility], [place] [name] [place], [place] [address]   SARS CORONAVIRUS 2 (TAT 6-24 HRS) Nasopharyngeal Nasopharyngeal Swab     Status: None   Collection Time: 09/26/19  7:15 PM   Specimen: Nasopharyngeal Swab  Result Value Ref Range   SARS Coronavirus 2 NEGATIVE NEGATIVE    Comment: (NOTE) SARS-CoV-2 target nucleic acids are NOT DETECTED. The SARS-CoV-2 RNA is generally detectable in upper and lower respiratory specimens during the acute phase of infection. Negative results do not preclude SARS-CoV-2 infection, do not rule out co-infections with other pathogens, and should not be used as the sole basis for treatment or other patient management decisions. Negative results must be combined with clinical observations, patient history, and epidemiological information. The expected result is Negative. Fact Sheet for Patients: SugarRoll.be Fact Sheet for Healthcare Providers: https://www.woods-mathews.com/ This test is not yet approved or cleared by the [place] FDA and  has been authorized for detection and/or diagnosis of SARS-CoV-2 by FDA under an Emergency Use Authorization (EUA). This EUA will remain  in effect (meaning this test can be used) for the duration of the COVID-19 declaration under Section 56 4(b)(1) of the Act, 21 U.S.C. section 360bbb-3(b)(1), unless  the authorization is terminated or revoked sooner. Performed at [facility], [place] [address] [place], [place] [address]   Urinalysis, Routine w reflex microscopic     Status: Abnormal   Collection Time: 09/26/19  7:39 PM  Result Value Ref Range   Color, Urine YELLOW YELLOW   APPearance CLEAR CLEAR   Specific Gravity, Urine 1.020 1.005 - 1.030   pH 6.5 5.0 - 8.0   Glucose, UA NEGATIVE NEGATIVE mg/dL   Hgb urine dipstick NEGATIVE NEGATIVE   Bilirubin Urine NEGATIVE NEGATIVE   Ketones, ur NEGATIVE NEGATIVE mg/dL   Protein, ur 30 (A) NEGATIVE mg/dL   Nitrite NEGATIVE NEGATIVE  Leukocytes,Ua NEGATIVE NEGATIVE    Comment: Performed at [facility], [place] [address], [place], [place] [address]  Urinalysis, Microscopic (reflex)     Status: None   Collection Time: 09/26/19  7:39 PM  Result Value Ref Range   RBC / HPF NONE SEEN 0 - 5 RBC/hpf   WBC, UA NONE SEEN 0 - 5 WBC/hpf   Bacteria, UA NONE SEEN NONE SEEN   Squamous Epithelial / LPF NONE SEEN 0 - 5    Comment: Performed at [facility], [place] [address], [place], [place] [address]  Troponin I (High Sensitivity)     Status: None   Collection Time: 09/26/19 10:20 PM  Result Value Ref Range   Troponin I (High Sensitivity) 11 <18 ng/L    Comment: (NOTE) Elevated high sensitivity troponin I (hsTnI) values and significant  changes across serial measurements may suggest ACS but many other  chronic and acute conditions are known to elevate hsTnI results.  Refer to the "Links" section for chest pain algorithms and additional  guidance. Performed at [facility], [place] [address], [place], [place] [address]   Magnesium     Status: None   Collection Time: 09/26/19 10:20 PM  Result Value Ref Range   Magnesium 2.1 1.7 - 2.4 mg/dL    Comment: Performed at [facility], [place] [address], [place], [place] [address]  Phosphorus     Status: None   Collection Time:  09/26/19 10:20 PM  Result Value Ref Range   Phosphorus 2.6 2.5 - 4.6 mg/dL    Comment: Performed at [facility], [place] [address], [place], [place] [address]  TSH     Status: None   Collection Time: 09/26/19 10:20 PM  Result Value Ref Range   TSH 0.468 0.350 - 4.500 uIU/mL    Comment: Performed by a 3rd Generation assay with a functional sensitivity of <=0.01 uIU/mL. Performed at [facility], [place] [address] [place], [place] [address]   Glucose, capillary     Status: None   Collection Time: 09/26/19 11:36 PM  Result Value Ref Range   Glucose-Capillary 96 70 - 99 mg/dL    Comment: Glucose reference range applies only to samples taken after fasting for at least 8 hours.  Basic metabolic panel     Status: Abnormal   Collection Time: 09/27/19  2:53 AM  Result Value Ref Range   Sodium 140 135 - 145 mmol/L   Potassium 4.1 3.5 - 5.1 mmol/L   Chloride 107 98 - 111 mmol/L   CO2 27 22 - 32 mmol/L   Glucose, Bld 101 (H) 70 - 99 mg/dL    Comment: Glucose reference range applies only to samples taken after fasting for at least 8 hours.   BUN 32 (H) 8 - 23 mg/dL   Creatinine, Ser 1.65 (H) 0.61 - 1.24 mg/dL   Calcium 10.2 8.9 - 10.3 mg/dL   GFR calc non Af Amer 39 (L) >60 mL/min   GFR calc Af Amer 45 (L) >60 mL/min   Anion gap 6 5 - 15    Comment: Performed at [facility], [place] [address] [place], [place] [address]  CBC     Status: Abnormal   Collection Time: 09/27/19  2:53 AM  Result Value Ref Range   WBC 5.4 4.0 - 10.5 K/uL   RBC 4.09 (L) 4.22 - 5.81 MIL/uL   Hemoglobin 11.8 (L) 13.0 - 17.0 g/dL  HCT 38.4 (L) 39.0 - 52.0 %   MCV 93.9 80.0 - 100.0 fL   MCH 28.9 26.0 - 34.0 pg   MCHC 30.7 30.0 - 36.0 g/dL   RDW 12.9 11.5 - 15.5 %   Platelets 136 (L) 150 - 400 K/uL   nRBC 0.0 0.0 - 0.2 %    Comment: Performed at [facility], [place] [address] [place], [place] [address]  Sodium, urine, random     Status: None    Collection Time: 09/27/19  4:00 AM  Result Value Ref Range   Sodium, Ur 139 mmol/L    Comment: Performed at [facility], [place] [address], [place], [place] [address]  Protein / creatinine ratio, urine     Status: Abnormal   Collection Time: 09/27/19  4:00 AM  Result Value Ref Range   Creatinine, Urine 89.69 mg/dL   Total Protein, Urine 17 mg/dL    Comment: NO NORMAL RANGE ESTABLISHED FOR THIS TEST   Protein Creatinine Ratio 0.19 (H) 0.00 - 0.15 mg/mg[Cre]    Comment: Performed at [facility], [place] [address], [place], [place] [address]  Glucose, capillary     Status: None   Collection Time: 09/27/19  7:37 AM  Result Value Ref Range   Glucose-Capillary 78 70 - 99 mg/dL    Comment: Glucose reference range applies only to samples taken after fasting for at least 8 hours.  Glucose, capillary     Status: Abnormal   Collection Time: 09/27/19 11:17 AM  Result Value Ref Range   Glucose-Capillary 156 (H) 70 - 99 mg/dL    Comment: Glucose reference range applies only to samples taken after fasting for at least 8 hours.  ECHOCARDIOGRAM COMPLETE     Status: None   Collection Time: 09/27/19 12:09 PM  Result Value Ref Range   Weight 3,587.33 oz   Height 72 in   BP 86/65 mmHg  Glucose, capillary     Status: None   Collection Time: 09/27/19  4:26 PM  Result Value Ref Range   Glucose-Capillary 97 70 - 99 mg/dL    Comment: Glucose reference range applies only to samples taken after fasting for at least 8 hours.  Glucose, capillary     Status: None   Collection Time: 09/27/19  9:00 PM  Result Value Ref Range   Glucose-Capillary 85 70 - 99 mg/dL    Comment: Glucose reference range applies only to samples taken after fasting for at least 8 hours.  Renal function panel     Status: Abnormal   Collection Time: 09/28/19  3:12 AM  Result Value Ref Range   Sodium 138 135 - 145 mmol/L   Potassium 3.7 3.5 - 5.1 mmol/L   Chloride 108 98 - 111 mmol/L   CO2 25 22 - 32  mmol/L   Glucose, Bld 86 70 - 99 mg/dL    Comment: Glucose reference range applies only to samples taken after fasting for at least 8 hours.   BUN 28 (H) 8 - 23 mg/dL   Creatinine, Ser 1.49 (H) 0.61 - 1.24 mg/dL   Calcium 10.2 8.9 - 10.3 mg/dL   Phosphorus 2.5 2.5 - 4.6 mg/dL   Albumin 3.4 (L) 3.5 - 5.0 g/dL   GFR calc non Af Amer 44 (L) >60 mL/min   GFR calc Af Amer 51 (L) >60 mL/min   Anion gap 5 5 - 15    Comment: Performed at [facility], [place] [address]  [place], [place] [address]  Glucose, capillary     Status: None   Collection Time: 09/28/19  7:44 AM  Result Value Ref Range   Glucose-Capillary 87 70 - 99 mg/dL    Comment: Glucose reference range applies only to samples taken after fasting for at least 8 hours.  Glucose, capillary     Status: Abnormal   Collection Time: 09/28/19 11:33 AM  Result Value Ref Range   Glucose-Capillary 108 (H) 70 - 99 mg/dL    Comment: Glucose reference range applies only to samples taken after fasting for at least 8 hours.  Glucose, capillary     Status: None   Collection Time: 09/28/19  4:54 PM  Result Value Ref Range   Glucose-Capillary 96 70 - 99 mg/dL    Comment: Glucose reference range applies only to samples taken after fasting for at least 8 hours.  Glucose, capillary     Status: None   Collection Time: 09/28/19 10:18 PM  Result Value Ref Range   Glucose-Capillary 97 70 - 99 mg/dL    Comment: Glucose reference range applies only to samples taken after fasting for at least 8 hours.  Renal function panel     Status: Abnormal   Collection Time: 09/29/19  4:03 AM  Result Value Ref Range   Sodium 139 135 - 145 mmol/L   Potassium 4.3 3.5 - 5.1 mmol/L   Chloride 109 98 - 111 mmol/L   CO2 25 22 - 32 mmol/L   Glucose, Bld 89 70 - 99 mg/dL    Comment: Glucose reference range applies only to samples taken after fasting for at least 8 hours.   BUN 27 (H) 8 - 23 mg/dL   Creatinine, Ser 1.32 (H) 0.61 - 1.24 mg/dL   Calcium  10.3 8.9 - 10.3 mg/dL   Phosphorus 2.7 2.5 - 4.6 mg/dL   Albumin 3.3 (L) 3.5 - 5.0 g/dL   GFR calc non Af Amer 51 (L) >60 mL/min   GFR calc Af Amer 59 (L) >60 mL/min   Anion gap 5 5 - 15    Comment: Performed at [facility], [place] [address], [place], [place] [address]  Glucose, capillary     Status: None   Collection Time: 09/29/19  7:57 AM  Result Value Ref Range   Glucose-Capillary 81 70 - 99 mg/dL    Comment: Glucose reference range applies only to samples taken after fasting for at least 8 hours.  Glucose, capillary     Status: None   Collection Time: 09/29/19 12:28 PM  Result Value Ref Range   Glucose-Capillary 99 70 - 99 mg/dL    Comment: Glucose reference range applies only to samples taken after fasting for at least 8 hours.  Glucose, capillary     Status: Abnormal   Collection Time: 09/29/19  5:44 PM  Result Value Ref Range   Glucose-Capillary 106 (H) 70 - 99 mg/dL    Comment: Glucose reference range applies only to samples taken after fasting for at least 8 hours.  Glucose, capillary     Status: Abnormal   Collection Time: 09/29/19  8:32 PM  Result Value Ref Range   Glucose-Capillary 157 (H) 70 - 99 mg/dL    Comment: Glucose reference range applies only to samples taken after fasting for at least 8 hours.  Renal function panel     Status: Abnormal   Collection Time: 09/30/19  3:33 AM  Result Value Ref Range   Sodium 138 135 - 145 mmol/L  Potassium 4.1 3.5 - 5.1 mmol/L   Chloride 105 98 - 111 mmol/L   CO2 27 22 - 32 mmol/L   Glucose, Bld 107 (H) 70 - 99 mg/dL    Comment: Glucose reference range applies only to samples taken after fasting for at least 8 hours.   BUN 30 (H) 8 - 23 mg/dL   Creatinine, Ser 1.44 (H) 0.61 - 1.24 mg/dL   Calcium 10.4 (H) 8.9 - 10.3 mg/dL   Phosphorus 3.5 2.5 - 4.6 mg/dL   Albumin 3.3 (L) 3.5 - 5.0 g/dL   GFR calc non Af Amer 46 (L) >60 mL/min   GFR calc Af Amer 54 (L) >60 mL/min   Anion gap 6 5 - 15    Comment:  Performed at [facility], [place] [address], [place], [place] [address]  Glucose, capillary     Status: None   Collection Time: 09/30/19  7:37 AM  Result Value Ref Range   Glucose-Capillary 76 70 - 99 mg/dL    Comment: Glucose reference range applies only to samples taken after fasting for at least 8 hours.  Glucose, capillary     Status: None   Collection Time: 09/30/19 12:09 PM  Result Value Ref Range   Glucose-Capillary 99 70 - 99 mg/dL    Comment: Glucose reference range applies only to samples taken after fasting for at least 8 hours.  Glucose, capillary     Status: None   Collection Time: 09/30/19  5:35 PM  Result Value Ref Range   Glucose-Capillary 86 70 - 99 mg/dL    Comment: Glucose reference range applies only to samples taken after fasting for at least 8 hours.  SARS CORONAVIRUS 2 (TAT 6-24 HRS) Nasopharyngeal Nasopharyngeal Swab     Status: None   Collection Time: 09/30/19  5:44 PM   Specimen: Nasopharyngeal Swab  Result Value Ref Range   SARS Coronavirus 2 NEGATIVE NEGATIVE    Comment: (NOTE) SARS-CoV-2 target nucleic acids are NOT DETECTED. The SARS-CoV-2 RNA is generally detectable in upper and lower respiratory specimens during the acute phase of infection. Negative results do not preclude SARS-CoV-2 infection, do not rule out co-infections with other pathogens, and should not be used as the sole basis for treatment or other patient management decisions. Negative results must be combined with clinical observations, patient history, and epidemiological information. The expected result is Negative. Fact Sheet for Patients: SugarRoll.be Fact Sheet for Healthcare Providers: https://www.woods-mathews.com/ This test is not yet approved or cleared by the [place] FDA and  has been authorized for detection and/or diagnosis of SARS-CoV-2 by FDA under an Emergency Use Authorization (EUA). This EUA will remain   in effect (meaning this test can be used) for the duration of the COVID-19 declaration under Section 56 4(b)(1) of the Act, 21 U.S.C. section 360bbb-3(b)(1), unless the authorization is terminated or revoked sooner. Performed at [facility], [place] [address], [place], [place] [address]   Glucose, capillary     Status: None   Collection Time: 09/30/19  9:43 PM  Result Value Ref Range   Glucose-Capillary 96 70 - 99 mg/dL    Comment: Glucose reference range applies only to samples taken after fasting for at least 8 hours.  Basic metabolic panel     Status: Abnormal   Collection Time: 10/01/19  2:49 AM  Result Value Ref Range   Sodium 137 135 - 145 mmol/L   Potassium 4.3 3.5 - 5.1 mmol/L   Chloride 103 98 -  111 mmol/L   CO2 25 22 - 32 mmol/L   Glucose, Bld 100 (H) 70 - 99 mg/dL    Comment: Glucose reference range applies only to samples taken after fasting for at least 8 hours.   BUN 37 (H) 8 - 23 mg/dL   Creatinine, Ser 1.44 (H) 0.61 - 1.24 mg/dL   Calcium 10.7 (H) 8.9 - 10.3 mg/dL   GFR calc non Af Amer 46 (L) >60 mL/min   GFR calc Af Amer 54 (L) >60 mL/min   Anion gap 9 5 - 15    Comment: Performed at [facility], [place] [address] [place], [place] [address]  CBC with Differential/Platelet     Status: Abnormal   Collection Time: 10/01/19  2:49 AM  Result Value Ref Range   WBC 4.9 4.0 - 10.5 K/uL   RBC 4.20 (L) 4.22 - 5.81 MIL/uL   Hemoglobin 12.1 (L) 13.0 - 17.0 g/dL   HCT 39.2 39.0 - 52.0 %   MCV 93.3 80.0 - 100.0 fL   MCH 28.8 26.0 - 34.0 pg   MCHC 30.9 30.0 - 36.0 g/dL   RDW 13.2 11.5 - 15.5 %   Platelets 121 (L) 150 - 400 K/uL    Comment: REPEATED TO VERIFY PLATELET COUNT CONFIRMED BY SMEAR SPECIMEN CHECKED FOR CLOTS    nRBC 0.0 0.0 - 0.2 %   Neutrophils Relative % 60 %   Neutro Abs 3.0 1.7 - 7.7 K/uL   Lymphocytes Relative 28 %   Lymphs Abs 1.4 0.7 - 4.0 K/uL   Monocytes Relative 10 %   Monocytes Absolute 0.5 0.1 - 1.0 K/uL   Eosinophils  Relative 2 %   Eosinophils Absolute 0.1 0.0 - 0.5 K/uL   Basophils Relative 0 %   Basophils Absolute 0.0 0.0 - 0.1 K/uL   Immature Granulocytes 0 %   Abs Immature Granulocytes 0.01 0.00 - 0.07 K/uL    Comment: Performed at [facility], [place] [address], [place], [place] [address]  Glucose, capillary     Status: None   Collection Time: 10/01/19  7:41 AM  Result Value Ref Range   Glucose-Capillary 82 70 - 99 mg/dL    Comment: Glucose reference range applies only to samples taken after fasting for at least 8 hours.  Glucose, capillary     Status: Abnormal   Collection Time: 10/01/19 11:32 AM  Result Value Ref Range   Glucose-Capillary 120 (H) 70 - 99 mg/dL    Comment: Glucose reference range applies only to samples taken after fasting for at least 8 hours.  Glucose, capillary     Status: None   Collection Time: 10/01/19  4:58 PM  Result Value Ref Range   Glucose-Capillary 88 70 - 99 mg/dL    Comment: Glucose reference range applies only to samples taken after fasting for at least 8 hours.  Glucose, capillary     Status: None   Collection Time: 10/01/19  8:50 PM  Result Value Ref Range   Glucose-Capillary 96 70 - 99 mg/dL    Comment: Glucose reference range applies only to samples taken after fasting for at least 8 hours.  Basic metabolic panel     Status: Abnormal   Collection Time: 10/02/19  3:22 AM  Result Value Ref Range   Sodium 137 135 - 145 mmol/L   Potassium 4.3 3.5 - 5.1 mmol/L   Chloride 106 98 - 111 mmol/L   CO2 26 22 - 32 mmol/L   Glucose,  Bld 103 (H) 70 - 99 mg/dL    Comment: Glucose reference range applies only to samples taken after fasting for at least 8 hours.   BUN 44 (H) 8 - 23 mg/dL   Creatinine, Ser 1.68 (H) 0.61 - 1.24 mg/dL   Calcium 10.5 (H) 8.9 - 10.3 mg/dL   GFR calc non Af Amer 38 (L) >60 mL/min   GFR calc Af Amer 44 (L) >60 mL/min   Anion gap 5 5 - 15    Comment: Performed at [facility], [place] [address]  [place], [place] [address]  Glucose, capillary     Status: None   Collection Time: 10/02/19  7:52 AM  Result Value Ref Range   Glucose-Capillary 82 70 - 99 mg/dL    Comment: Glucose reference range applies only to samples taken after fasting for at least 8 hours.  Glucose, capillary     Status: None   Collection Time: 10/02/19 12:04 PM  Result Value Ref Range   Glucose-Capillary 88 70 - 99 mg/dL    Comment: Glucose reference range applies only to samples taken after fasting for at least 8 hours.  Glucose, capillary     Status: Abnormal   Collection Time: 10/02/19  4:56 PM  Result Value Ref Range   Glucose-Capillary 103 (H) 70 - 99 mg/dL    Comment: Glucose reference range applies only to samples taken after fasting for at least 8 hours.  Glucose, capillary     Status: None   Collection Time: 10/02/19  8:54 PM  Result Value Ref Range   Glucose-Capillary 92 70 - 99 mg/dL    Comment: Glucose reference range applies only to samples taken after fasting for at least 8 hours.  Glucose, capillary     Status: None   Collection Time: 10/03/19  7:59 AM  Result Value Ref Range   Glucose-Capillary 80 70 - 99 mg/dL    Comment: Glucose reference range applies only to samples taken after fasting for at least 8 hours.  SARS CORONAVIRUS 2 (TAT 6-24 HRS) Nasopharyngeal Nasopharyngeal Swab     Status: None   Collection Time: 10/03/19 11:06 AM   Specimen: Nasopharyngeal Swab  Result Value Ref Range   SARS Coronavirus 2 NEGATIVE NEGATIVE    Comment: (NOTE) SARS-CoV-2 target nucleic acids are NOT DETECTED. The SARS-CoV-2 RNA is generally detectable in upper and lower respiratory specimens during the acute phase of infection. Negative results do not preclude SARS-CoV-2 infection, do not rule out co-infections with other pathogens, and should not be used as the sole basis for treatment or other patient management decisions. Negative results must be combined with clinical observations, patient history, and  epidemiological information. The expected result is Negative. Fact Sheet for Patients: SugarRoll.be Fact Sheet for Healthcare Providers: https://www.woods-mathews.com/ This test is not yet approved or cleared by the [place] FDA and  has been authorized for detection and/or diagnosis of SARS-CoV-2 by FDA under an Emergency Use Authorization (EUA). This EUA will remain  in effect (meaning this test can be used) for the duration of the COVID-19 declaration under Section 56 4(b)(1) of the Act, 21 U.S.C. section 360bbb-3(b)(1), unless the authorization is terminated or revoked sooner. Performed at [facility], [place] [address] [place], [place] [address]   Glucose, capillary     Status: None   Collection Time: 10/03/19 12:46 PM  Result Value Ref Range   Glucose-Capillary 81 70 - 99 mg/dL    Comment: Glucose reference range applies only  to samples taken after fasting for at least 8 hours.  Glucose, capillary     Status: None   Collection Time: 10/03/19  5:06 PM  Result Value Ref Range   Glucose-Capillary 82 70 - 99 mg/dL    Comment: Glucose reference range applies only to samples taken after fasting for at least 8 hours.   Comment 1 Document in Chart   Glucose, capillary     Status: Abnormal   Collection Time: 10/03/19  8:46 PM  Result Value Ref Range   Glucose-Capillary 103 (H) 70 - 99 mg/dL    Comment: Glucose reference range applies only to samples taken after fasting for at least 8 hours.  Glucose, capillary     Status: None   Collection Time: 10/04/19  7:29 AM  Result Value Ref Range   Glucose-Capillary 95 70 - 99 mg/dL    Comment: Glucose reference range applies only to samples taken after fasting for at least 8 hours.  Glucose, capillary     Status: None   Collection Time: 10/04/19 12:14 PM  Result Value Ref Range   Glucose-Capillary 91 70 - 99 mg/dL    Comment: Glucose reference range applies only to samples taken after  fasting for at least 8 hours.  Glucose, capillary     Status: None   Collection Time: 10/04/19  4:57 PM  Result Value Ref Range   Glucose-Capillary 94 70 - 99 mg/dL    Comment: Glucose reference range applies only to samples taken after fasting for at least 8 hours.  Glucose, capillary     Status: None   Collection Time: 10/04/19 11:36 PM  Result Value Ref Range   Glucose-Capillary 88 70 - 99 mg/dL    Comment: Glucose reference range applies only to samples taken after fasting for at least 8 hours.  Glucose, capillary     Status: None   Collection Time: 10/05/19  7:50 AM  Result Value Ref Range   Glucose-Capillary 80 70 - 99 mg/dL    Comment: Glucose reference range applies only to samples taken after fasting for at least 8 hours.  Glucose, capillary     Status: None   Collection Time: 10/05/19 11:28 AM  Result Value Ref Range   Glucose-Capillary 94 70 - 99 mg/dL    Comment: Glucose reference range applies only to samples taken after fasting for at least 8 hours.  Glucose, capillary     Status: None   Collection Time: 10/05/19  4:24 PM  Result Value Ref Range   Glucose-Capillary 84 70 - 99 mg/dL    Comment: Glucose reference range applies only to samples taken after fasting for at least 8 hours.  Glucose, capillary     Status: Abnormal   Collection Time: 10/05/19  8:24 PM  Result Value Ref Range   Glucose-Capillary 101 (H) 70 - 99 mg/dL    Comment: Glucose reference range applies only to samples taken after fasting for at least 8 hours.  Glucose, capillary     Status: None   Collection Time: 10/06/19  8:08 AM  Result Value Ref Range   Glucose-Capillary 72 70 - 99 mg/dL    Comment: Glucose reference range applies only to samples taken after fasting for at least 8 hours.  Basic metabolic panel     Status: Abnormal   Collection Time: 10/06/19  8:32 AM  Result Value Ref Range   Sodium 134 (L) 135 - 145 mmol/L   Potassium 5.2 (H) 3.5 - 5.1 mmol/L  Chloride 101 98 - 111 mmol/L    CO2 27 22 - 32 mmol/L   Glucose, Bld 84 70 - 99 mg/dL    Comment: Glucose reference range applies only to samples taken after fasting for at least 8 hours.   BUN 46 (H) 8 - 23 mg/dL   Creatinine, Ser 1.70 (H) 0.61 - 1.24 mg/dL   Calcium 10.8 (H) 8.9 - 10.3 mg/dL   GFR calc non Af Amer 38 (L) >60 mL/min   GFR calc Af Amer 44 (L) >60 mL/min   Anion gap 6 5 - 15    Comment: Performed at [facility], [place] [address], [place], [place] [address]  Glucose, capillary     Status: Abnormal   Collection Time: 10/06/19 11:24 AM  Result Value Ref Range   Glucose-Capillary 129 (H) 70 - 99 mg/dL    Comment: Glucose reference range applies only to samples taken after fasting for at least 8 hours.  Glucose, capillary     Status: Abnormal   Collection Time: 10/06/19  4:04 PM  Result Value Ref Range   Glucose-Capillary 106 (H) 70 - 99 mg/dL    Comment: Glucose reference range applies only to samples taken after fasting for at least 8 hours.  Glucose, capillary     Status: None   Collection Time: 10/06/19  9:53 PM  Result Value Ref Range   Glucose-Capillary 87 70 - 99 mg/dL    Comment: Glucose reference range applies only to samples taken after fasting for at least 8 hours.  Glucose, capillary     Status: None   Collection Time: 10/07/19  8:21 AM  Result Value Ref Range   Glucose-Capillary 84 70 - 99 mg/dL    Comment: Glucose reference range applies only to samples taken after fasting for at least 8 hours.  Glucose, capillary     Status: Abnormal   Collection Time: 10/07/19 11:33 AM  Result Value Ref Range   Glucose-Capillary 143 (H) 70 - 99 mg/dL    Comment: Glucose reference range applies only to samples taken after fasting for at least 8 hours.  Respiratory Panel by RT PCR (Flu A&B, Covid) - Nasopharyngeal Swab     Status: None   Collection Time: 10/07/19 12:53 PM   Specimen: Nasopharyngeal Swab  Result Value Ref Range   SARS Coronavirus 2 by RT PCR NEGATIVE NEGATIVE     Comment: (NOTE) SARS-CoV-2 target nucleic acids are NOT DETECTED. The SARS-CoV-2 RNA is generally detectable in upper respiratoy specimens during the acute phase of infection. The lowest concentration of SARS-CoV-2 viral copies this assay can detect is 131 copies/mL. A negative result does not preclude SARS-Cov-2 infection and should not be used as the sole basis for treatment or other patient management decisions. A negative result may occur with  improper specimen collection/handling, submission of specimen other than nasopharyngeal swab, presence of viral mutation(s) within the areas targeted by this assay, and inadequate number of viral copies (<131 copies/mL). A negative result must be combined with clinical observations, patient history, and epidemiological information. The expected result is Negative. Fact Sheet for Patients:  PinkCheek.be Fact Sheet for Healthcare Providers:  GravelBags.it This test is not yet ap proved or cleared by the [place] FDA and  has been authorized for detection and/or diagnosis of SARS-CoV-2 by FDA under an Emergency Use Authorization (EUA). This EUA will remain  in effect (meaning this test can be used) for the duration of the COVID-19 declaration under Section 564(b)(1) of  the Act, 21 U.S.C. section 360bbb-3(b)(1), unless the authorization is terminated or revoked sooner.    Influenza A by PCR NEGATIVE NEGATIVE   Influenza B by PCR NEGATIVE NEGATIVE    Comment: (NOTE) The Xpert Xpress SARS-CoV-2/FLU/RSV assay is intended as an aid in  the diagnosis of influenza from Nasopharyngeal swab specimens and  should not be used as a sole basis for treatment. Nasal washings and  aspirates are unacceptable for Xpert Xpress SARS-CoV-2/FLU/RSV  testing. Fact Sheet for Patients: PinkCheek.be Fact Sheet for Healthcare  Providers: GravelBags.it This test is not yet approved or cleared by the [place] FDA and  has been authorized for detection and/or diagnosis of SARS-CoV-2 by  FDA under an Emergency Use Authorization (EUA). This EUA will remain  in effect (meaning this test can be used) for the duration of the  Covid-19 declaration under Section 564(b)(1) of the Act, 21  U.S.C. section 360bbb-3(b)(1), unless the authorization is  terminated or revoked. Performed at [facility], [place] [address] [place], [place] [address]   CBC     Status: Abnormal   Collection Time: 11/15/19  9:46 AM  Result Value Ref Range   WBC 5.2 3.4 - 10.8 x10E3/uL   RBC 4.36 4.14 - 5.80 x10E6/uL   Hemoglobin 12.6 (L) 13.0 - 17.7 g/dL   Hematocrit 38.1 37.5 - 51.0 %   MCV 87 79 - 97 fL   MCH 28.9 26.6 - 33.0 pg   MCHC 33.1 31.5 - 35.7 g/dL   RDW 12.8 11.6 - 15.4 %   Platelets 192 150 - 450 [id number]  Basic metabolic panel     Status: Abnormal   Collection Time: 11/15/19  9:46 AM  Result Value Ref Range   Glucose 63 (L) 65 - 99 mg/dL   BUN 32 (H) 8 - 27 mg/dL   Creatinine, Ser 1.45 (H) 0.76 - 1.27 mg/dL   GFR calc non Af Amer 46 (L) >59 mL/min/1.73   GFR calc Af Amer 53 (L) >59 mL/min/1.73   BUN/Creatinine Ratio 22 10 - 24   Sodium 142 134 - 144 mmol/L   Potassium 4.6 3.5 - 5.2 mmol/L   Chloride 104 96 - 106 mmol/L   CO2 23 20 - 29 mmol/L   Calcium 10.8 (H) 8.6 - 10.2 mg/dL      Psychiatric Specialty Exam: Physical Exam  Review of Systems  There were no vitals taken for this visit.There is no height or weight on file to calculate BMI.  General Appearance: NA  Eye Contact:  NA  Speech:  Slow  Volume:  Decreased  Mood:  frustrated  Affect:  NA  Thought Process:  Descriptions of Associations: Intact  Orientation:  Full (Time, Place, and Person)  Thought Content:  Rumination  Suicidal Thoughts:  No  Homicidal Thoughts:  No  Memory:  Immediate;   Fair Recent;    Fair Remote;   Fair  Judgement:  Fair  Insight:  Fair  Psychomotor Activity:  NA  Concentration:  Concentration: Fair and Attention Span: Fair  Recall:  [employer] of Knowledge:  Good  Language:  Good  Akathisia:  NA  Handed:  Right  AIMS (if indicated):     Assets:  Communication Skills Desire for Improvement Housing Resilience Social Support  ADL's:  Intact  Cognition:  WNL  Sleep:   ok      Assessment and Plan: Bipolar disorder type I.  Generalized anxiety disorder.  I review notes from neurology, recent discharge  summary and labs.  His creatinine is improved from the past.  We discussed not to drive until he gets clearance from [facility] as patient recently had syncopal episode.  Though he is not happy about it but he agreed that he will wait for the clearance from [facility].  He had a improved relationship with his daughter.  We discussed trying Seroquel since Risperdal can worsen his tremors.  He agreed with the plan.  We will discontinue Risperdal and we will start Seroquel 25 mg-50 mg at bedtime to help with his mood symptoms.  Continue Zoloft 100 mg daily.  Discussed medication side effects and benefits.  I recommended to call us back if is any questions or any concerns.  Follow-up in 6 weeks.  He is not interested in therapy.  I will forward my note to his PCP Dr. Deforest Hoyles.  Follow Up Instructions:    I discussed the assessment and treatment plan with the patient. The patient was provided an opportunity to ask questions and all were answered. The patient agreed with the plan and demonstrated an understanding of the instructions.   The patient was advised to call back or seek an in-person evaluation if the symptoms worsen or if the condition fails to improve as anticipated.  I provided 20 minutes of non-face-to-face time during this encounter.   Kathlee Nations, MD

## 2019-12-04 ENCOUNTER — Telehealth: Payer: Self-pay | Admitting: Cardiology

## 2019-12-04 NOTE — Telephone Encounter (Signed)
   [facility] Pre-operative Risk Assessment    Request for surgical clearance:  1. What type of surgery is being performed? Tooth Extraction x 19  2. When is this surgery scheduled? TBD based on clearance   3. What type of clearance is required (medical clearance vs. Pharmacy clearance to hold med vs. Both)? Both   4. Are there any medications that need to be held prior to surgery and how long?Eliquis TBD by Cardiology   5. Practice name and name of physician performing surgery?   [facility]  6. What is your office phone number: [phone]   7.   What is your office fax number: [phone]   8.   Anesthesia type (None, local, MAC, general) ? General    Fernando Carr 12/04/2019, 3:06 PM  _________________________________________________________________   (provider comments below)

## 2019-12-04 NOTE — Telephone Encounter (Signed)
Pharmacy please give recommendations regarding holding Eliquis for multiple dental extractions. Hx of Afib, please route response to P CV DIV PREOP. Thanks

## 2019-12-05 NOTE — Telephone Encounter (Signed)
Left VM for patient to callback to clarify medications.

## 2019-12-05 NOTE — Telephone Encounter (Signed)
   Primary Cardiologist: Minus Breeding, MD  Chart reviewed as part of pre-operative protocol coverage. Patient was contacted 12/05/2019 in reference to pre-operative risk assessment for pending surgery as outlined below.  Alfredo Batty Resurreccion was last seen on 4/21 by Dr. Percival Spanish.  Since that day, NAZZARENO PROSEN has been stable from a cardiac standpoint. No anginal symptoms or palpitations.  I confirmed that he has NOT started Eliquis at this time.   Therefore, based on ACC/AHA guidelines, the patient would be at acceptable risk for the planned procedure without further cardiovascular testing.   I will route this recommendation to the requesting party via Epic fax function and remove from pre-op pool.  Please call with questions.  Reino Bellis, NP 12/05/2019, 4:37 PM

## 2019-12-05 NOTE — Telephone Encounter (Signed)
Patient had phone visit with psychiatrist on 12/03/19 and Dr. Adele Schilder noted that patient is not taking Eliquis.  Can we verify?
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
## 2019-12-16 ENCOUNTER — Encounter: Payer: Self-pay | Admitting: Cardiology

## 2019-12-16 ENCOUNTER — Other Ambulatory Visit: Payer: Self-pay

## 2019-12-16 ENCOUNTER — Ambulatory Visit (INDEPENDENT_AMBULATORY_CARE_PROVIDER_SITE_OTHER): Payer: Medicare HMO | Admitting: Cardiology

## 2019-12-16 VITALS — BP 132/78 | HR 64 | Ht 72.0 in | Wt 233.0 lb

## 2019-12-16 DIAGNOSIS — R55 Syncope and collapse: Secondary | ICD-10-CM

## 2019-12-16 DIAGNOSIS — N1832 Chronic kidney disease, stage 3b: Secondary | ICD-10-CM | POA: Diagnosis not present

## 2019-12-16 DIAGNOSIS — I4891 Unspecified atrial fibrillation: Secondary | ICD-10-CM

## 2019-12-16 DIAGNOSIS — I517 Cardiomegaly: Secondary | ICD-10-CM | POA: Diagnosis not present

## 2019-12-16 DIAGNOSIS — Z7189 Other specified counseling: Secondary | ICD-10-CM

## 2019-12-16 NOTE — Patient Instructions (Addendum)
Medication Instructions:  NO CHANGES *If you need a refill on your cardiac medications before your next appointment, please call your pharmacy*  Lab Work: Your physician recommends that you return for lab work today (MYELOMA PANEL)  Testing/Procedures: NONE ORDERED THIS VISIT  Follow-Up: At [facility], you and your health needs are our priority.  As part of our continuing mission to provide you with exceptional heart care, we have created designated Provider Care Teams.  These Care Teams include your primary Cardiologist (physician) and Advanced Practice Providers (APPs -  Physician Assistants and Nurse Practitioners) who all work together to provide you with the care you need, when you need it.  Your next appointment:   12 month(s)  You will receive a reminder letter in the mail two months in advance. If you don't receive a letter, please call our office to schedule the follow-up appointment.  The format for your next appointment:   In Person  Provider:   Minus Breeding, MD

## 2019-12-18 LAB — MULTIPLE MYELOMA PANEL, SERUM
Albumin SerPl Elph-Mcnc: 3.6 g/dL (ref 2.9–4.4)
Albumin/Glob SerPl: 1.1 (ref 0.7–1.7)
Alpha 1: 0.3 g/dL (ref 0.0–0.4)
Alpha2 Glob SerPl Elph-Mcnc: 0.7 g/dL (ref 0.4–1.0)
B-Globulin SerPl Elph-Mcnc: 1.2 g/dL (ref 0.7–1.3)
Gamma Glob SerPl Elph-Mcnc: 1.2 g/dL (ref 0.4–1.8)
Globulin, Total: 3.4 g/dL (ref 2.2–3.9)
IgA/Immunoglobulin A, Serum: 360 mg/dL (ref 61–437)
IgG (Immunoglobin G), Serum: 1211 mg/dL (ref 603–1613)
IgM (Immunoglobulin M), Srm: 112 mg/dL (ref 15–143)
Total Protein: 7 g/dL (ref 6.0–8.5)

## 2020-01-02 ENCOUNTER — Other Ambulatory Visit (HOSPITAL_COMMUNITY): Payer: Self-pay | Admitting: *Deleted

## 2020-01-02 ENCOUNTER — Telehealth (HOSPITAL_COMMUNITY): Payer: Self-pay | Admitting: *Deleted

## 2020-01-02 MED ORDER — RISPERIDONE 0.5 MG PO TABS: 0.5000 mg | ORAL_TABLET | Freq: Every day | ORAL | 1 refills | Status: DC

## 2020-01-02 NOTE — Telephone Encounter (Signed)
Writer returned pt call regarding instructions for taking the Seroquel and med ed was reinforced however pt began stating that he was "really depressed" and "can't get anything right" and "I can't get out of my own way". Pt did endorse S.I.stating that suicide "seems like a good solution",  but denies a plan. He does not return to clinic until 03/04/20. He is asking for a call from you. This nurse also offered an earlier appointment and told pt she would call him back after speaking with Dr. Adele Schilder. Please review and advise.

## 2020-01-02 NOTE — Telephone Encounter (Signed)
I returned patient's phone call.  He is more depressed than before.  Sometimes he does not know what is working and cannot get anything right.  He is not sure if the medicine is working.  He is now taking quetiapine because we have stopped the Risperdal due to tremors.  He do not see any improvement with Seroquel and he continues to have tremors.  He denies any active suicidal thoughts.  He agreed to go back on Risperdal 0.5 mg which had helped him in the past.  Please discontinue quetiapine and restart and call Risperdal 0.5 mg to take at bedtime at his local pharmacy [facility].  Please move his appointment earlier than his a schedule date.

## 2020-01-13 ENCOUNTER — Other Ambulatory Visit (HOSPITAL_COMMUNITY): Payer: Self-pay | Admitting: Psychiatry

## 2020-01-13 DIAGNOSIS — F319 Bipolar disorder, unspecified: Secondary | ICD-10-CM

## 2020-01-28 ENCOUNTER — Other Ambulatory Visit (HOSPITAL_COMMUNITY): Payer: Self-pay | Admitting: Psychiatry

## 2020-02-25 ENCOUNTER — Other Ambulatory Visit (HOSPITAL_COMMUNITY): Payer: Self-pay | Admitting: Psychiatry

## 2020-02-25 DIAGNOSIS — F411 Generalized anxiety disorder: Secondary | ICD-10-CM

## 2020-02-25 DIAGNOSIS — F319 Bipolar disorder, unspecified: Secondary | ICD-10-CM

## 2020-03-04 ENCOUNTER — Telehealth (HOSPITAL_COMMUNITY): Payer: Medicare HMO | Admitting: Psychiatry

## 2020-03-04 ENCOUNTER — Other Ambulatory Visit: Payer: Self-pay

## 2020-03-13 ENCOUNTER — Other Ambulatory Visit: Payer: Self-pay

## 2020-03-13 ENCOUNTER — Encounter (HOSPITAL_COMMUNITY): Payer: Self-pay | Admitting: Psychiatry

## 2020-03-13 ENCOUNTER — Telehealth (HOSPITAL_COMMUNITY): Payer: Self-pay | Admitting: *Deleted

## 2020-03-13 ENCOUNTER — Other Ambulatory Visit (HOSPITAL_COMMUNITY): Payer: Self-pay | Admitting: *Deleted

## 2020-03-13 ENCOUNTER — Telehealth (INDEPENDENT_AMBULATORY_CARE_PROVIDER_SITE_OTHER): Payer: Medicare HMO | Admitting: Psychiatry

## 2020-03-13 DIAGNOSIS — F319 Bipolar disorder, unspecified: Secondary | ICD-10-CM

## 2020-03-13 DIAGNOSIS — F411 Generalized anxiety disorder: Secondary | ICD-10-CM

## 2020-03-13 MED ORDER — RISPERIDONE 0.5 MG PO TABS: 0.5000 mg | ORAL_TABLET | Freq: Every day | ORAL | 1 refills | Status: DC

## 2020-03-13 MED ORDER — SERTRALINE HCL 100 MG PO TABS: ORAL_TABLET | ORAL | 0 refills | Status: DC

## 2020-03-13 NOTE — Telephone Encounter (Signed)
Thanks

## 2020-03-13 NOTE — Telephone Encounter (Signed)
This nurse called Alfredo Bach per Dr. Marguerite Olea instructions to check on pt and speak with nurse. Writer was informed that that community is strictly independent living, no nurse, pt is responsible for getting to appointments, pharmacy, etc... writer was given the name and number of pt guardian [name] [phone] who was called but had to leave a VM. Awaiting return call from guardian.

## 2020-03-13 NOTE — Progress Notes (Signed)
Virtual Visit via Telephone Note  I connected with Fernando Carr on 03/13/20 at  9:00 AM EDT by telephone and verified that I am speaking with the correct person using two identifiers.  Location: Patient: Independent living Provider: Home office   I discussed the limitations, risks, security and privacy concerns of performing an evaluation and management service by telephone and the availability of in person appointments. I also discussed with the patient that there may be a patient responsible charge related to this service. The patient expressed understanding and agreed to proceed.   History of Present Illness: Patient is evaluated by phone session.  He is sad and hopeless because he recently moved to independent living at [facility].  It has been 4 days since he moved then.  He does not know anyone and struggles to get his medication.  He has not taken Risperdal and Zoloft for more than a week.  Patient told he does not have any way to get medicine from the pharmacy.  He does not drive because of keep having syncopal episode.  He feels overwhelmed because he has to sell his house.  He has multiple health issues.  He has difficulty remembering things.  He admitted some time crying spells and feeling hopelessness and passive and fleeting suicidal thoughts but no plan or any intent.  He does not have any family support.  He has a sister who lives in [place].  He does not want to involve her and when I asked by then he replied I do not know.  He is sleeping but having a lot of racing thoughts.  He admitted being scared by living in independent living alone.  He does not know staff member.  He had appointment with his PCP Dr. Deforest Hoyles.  Patient denies any paranoia, anger, hallucination.   Past Psychiatric History:Reviewed. H/Osuicidal attemptbyinjectingEthyline Glycol. Had acute renal failure and required dialysis at that time. H/Oparanoia andmania withimpulsive buyingof  expensive carsand selling propertywhendepressed.Tried Abilify and Lexapro. However he had a good response with Risperdal.    Psychiatric Specialty Exam: Physical Exam  Review of Systems  There were no vitals taken for this visit.There is no height or weight on file to calculate BMI.  General Appearance: NA  Eye Contact:  NA  Speech:  Slow  Volume:  Decreased  Mood:  Depressed and Hopeless  Affect:  NA  Thought Process:  Descriptions of Associations: Intact  Orientation:  Full (Time, Place, and Person)  Thought Content:  Rumination  Suicidal Thoughts:  passive and fleeting thoughts but no plan  Homicidal Thoughts:  No  Memory:  Immediate;   Fair Recent;   Fair Remote;   Fair  Judgement:  Fair  Insight:  Shallow  Psychomotor Activity:  Tremor  Concentration:  Concentration: Fair and Attention Span: Fair  Recall:  [employer] of Knowledge:  Fair  Language:  Fair  Akathisia:  NA  Handed:  Right  AIMS (if indicated):     Assets:  Communication Skills Desire for Improvement Housing  ADL's:  Intact  Cognition:  WNL  Sleep:   fair      Assessment and Plan: Bipolar disorder type I.  Generalized anxiety disorder.  Patient struggle with hopelessness because he does not have help around and resources.  He just moved into independent living facility and I do believe he cannot do everything on his own.  He may need assisted living facility.  He has not picked up his medication as he  cannot drive.  I encouraged him to talk to his PCP today as he had appointment with Dr. Deforest Hoyles to discuss moving him to assisted living facility so he has more help around.  We will also call his family member so he can get some help about things that he need to do.  He is overwhelmed because he has to sell his house.  We talked about hopelessness and passive and fleeting suicidal thoughts and patient denies any active plan as he is more anxious about his future and his health.  He does not know anyone  in this new place.  He has a hard time adjusting to living there.  I also spoke to his PCP Dr. Deforest Hoyles and talked to him in detail about better living situation.  He agreed that patient need assisted living facility and he will contact to the social worker to address this issue.  We will also call pharmacy to see if they can deliver his medication as patient does not have transportation.  Discussed safety concerns and anytime having active suicidal thoughts or homicidal thought that he need to call 911 or go to local emergency room.  Follow-up in 2 weeks.  Follow Up Instructions:    I discussed the assessment and treatment plan with the patient. The patient was provided an opportunity to ask questions and all were answered. The patient agreed with the plan and demonstrated an understanding of the instructions.   The patient was advised to call back or seek an in-person evaluation if the symptoms worsen or if the condition fails to improve as anticipated.  I provided 30 minutes of non-face-to-face time during this encounter.   Kathlee Nations, MD

## 2020-03-13 NOTE — Telephone Encounter (Signed)
Writer called pt [facility] [address] to inquire if they have a delivery service for geriatric pts. They do and it's free by [employer], overnight $11. Writer then called pt CM Anderson Malta and informed her that pharmacy just needs a credit or debit card on file. Anderson Malta will get that information and get that started she said. She also said pt has the medications at home he's just not taking them. And she is in the process of setting up for an aide to come in and assist with medication management, as well as OT and PT. CM says pt is not a candidate for assisted living as he does all his own ADL's and is independent with the exception of driving. She also asked about in person visits as due to his tremors it's very difficult for him to dial or hold a phone. Writer reiiterated that pt is expressing s.i. and has a strong h/o serious previous attempts. CM verbalizes understanding.

## 2020-03-13 NOTE — Telephone Encounter (Signed)
Thanks. I spoke to PCP Dr Deforest Hoyles. He may need Assisted living. He will ask social worker to see he can move from [place] to [place].

## 2020-03-16 NOTE — Telephone Encounter (Signed)
[facility]

## 2020-03-23 ENCOUNTER — Other Ambulatory Visit (HOSPITAL_COMMUNITY): Payer: Self-pay | Admitting: Psychiatry

## 2020-03-23 DIAGNOSIS — F319 Bipolar disorder, unspecified: Secondary | ICD-10-CM

## 2020-04-07 ENCOUNTER — Telehealth: Payer: Self-pay

## 2020-04-07 NOTE — Telephone Encounter (Signed)
Pt left a VM asking for a call back to cancel his upcoming appt. He reports that he is not driving. He would like a call back.

## 2020-04-07 NOTE — Telephone Encounter (Signed)
Called patient and advised him that the FU next week was made last year, however he saw Dr Leta Baptist May 2021. Dr Leta Baptist stated he may FU in a year, May 2022. Patient will keep May 2022 FU.  Patient verbalized understanding, appreciation.

## 2020-04-13 ENCOUNTER — Ambulatory Visit: Payer: Medicare HMO | Admitting: Diagnostic Neuroimaging

## 2020-05-06 ENCOUNTER — Other Ambulatory Visit: Payer: Self-pay

## 2020-05-06 ENCOUNTER — Ambulatory Visit (INDEPENDENT_AMBULATORY_CARE_PROVIDER_SITE_OTHER): Payer: Medicare HMO | Admitting: Podiatry

## 2020-05-06 DIAGNOSIS — L989 Disorder of the skin and subcutaneous tissue, unspecified: Secondary | ICD-10-CM | POA: Diagnosis not present

## 2020-05-06 DIAGNOSIS — L84 Corns and callosities: Secondary | ICD-10-CM | POA: Diagnosis not present
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
## 2020-06-12 ENCOUNTER — Encounter (HOSPITAL_COMMUNITY): Payer: Self-pay | Admitting: Psychiatry

## 2020-06-12 ENCOUNTER — Telehealth (INDEPENDENT_AMBULATORY_CARE_PROVIDER_SITE_OTHER): Payer: Medicare HMO | Admitting: Psychiatry

## 2020-06-12 ENCOUNTER — Other Ambulatory Visit: Payer: Self-pay

## 2020-06-12 VITALS — Wt 220.0 lb

## 2020-06-12 DIAGNOSIS — F419 Anxiety disorder, unspecified: Secondary | ICD-10-CM

## 2020-06-12 DIAGNOSIS — F319 Bipolar disorder, unspecified: Secondary | ICD-10-CM | POA: Diagnosis not present

## 2020-06-12 MED ORDER — RISPERIDONE 0.5 MG PO TABS: 0.5000 mg | ORAL_TABLET | Freq: Every day | ORAL | 0 refills | Status: DC

## 2020-06-12 MED ORDER — SERTRALINE HCL 100 MG PO TABS: 100.0000 mg | ORAL_TABLET | Freq: Every day | ORAL | 0 refills | Status: DC

## 2020-06-12 NOTE — Progress Notes (Signed)
Virtual Visit via Telephone Note  I connected with Fernando Carr on 06/12/20 at 11:00 AM EST by telephone and verified that I am speaking with the correct person using two identifiers.  Location: Patient: Independent Living Provider: Home Office   I discussed the limitations, risks, security and privacy concerns of performing an evaluation and management service by telephone and the availability of in person appointments. I also discussed with the patient that there may be a patient responsible charge related to this service. The patient expressed understanding and agreed to proceed.   History of Present Illness: Patient is evaluated by phone session.  He is now settling much better in assisted living facility.  He started making friends and he joined the gym and is going there every day.  He lost some weight.  He feels happy but sometime he wishes that he have car so he can go to the places.  So far he has no major problem.  He is sleeping good.  He denies any crying spells, mania, psychosis or any hallucination.  He lost the weight since he is going to gym every day.  He is also compliant with the medication and keeping healthy diet.  His daughter visits him multiple times and is happy about it.  He is taking respite all and Zoloft and reported no tremors shakes or any EPS.   Past Psychiatric History: H/Osuicidal attemptbyinjectingEthyline Glycol. Hadacute renal failure and required dialysis at that time. H/Oparanoia andmania withimpulsive buyingof expensive carsand selling propertywhendepressed.Tried Abilify and Lexapro. However he had a good response with Risperdal.   Psychiatric Specialty Exam: Physical Exam  Review of Systems  Weight 220 lb (99.8 kg).There is no height or weight on file to calculate BMI.  General Appearance: NA  Eye Contact:  NA  Speech:  Slow  Volume:  Decreased  Mood:  Euthymic  Affect:  NA  Thought Process:  Goal Directed  Orientation:  Full (Time,  Place, and Person)  Thought Content:  Logical  Suicidal Thoughts:  No  Homicidal Thoughts:  No  Memory:  Immediate;   Good Recent;   Fair Remote;   Fair  Judgement:  Intact  Insight:  Present  Psychomotor Activity:  NA  Concentration:  Concentration: Fair and Attention Span: Fair  Recall:  Good  Fund of Knowledge:  Good  Language:  Good  Akathisia:  No  Handed:  Right  AIMS (if indicated):     Assets:  Communication Skills Desire for Improvement Housing Social Support  ADL's:  Intact  Cognition:  WNL  Sleep:   good      Assessment and Plan: Bipolar disorder type I.  Anxiety.  Patient doing much better and settling well in an assisted living facility.  He started making friends and started going to gym every day.  He lost weight.  He does not want to change the medication since it is working well.  Continue Risperdal 0.5 mg at bedtime and Zoloft 100 mg daily.  Recommended to call us back if is any question or any concern.  Follow-up in 3 months.  Follow Up Instructions:    I discussed the assessment and treatment plan with the patient. The patient was provided an opportunity to ask questions and all were answered. The patient agreed with the plan and demonstrated an understanding of the instructions.   The patient was advised to call back or seek an in-person evaluation if the symptoms worsen or if the condition fails to improve as anticipated.  I provided 19 minutes of non-face-to-face time during this encounter.   Kathlee Nations, MD

## 2020-07-28 ENCOUNTER — Telehealth (HOSPITAL_COMMUNITY): Payer: Self-pay | Admitting: *Deleted

## 2020-07-28 ENCOUNTER — Other Ambulatory Visit (HOSPITAL_COMMUNITY): Payer: Self-pay | Admitting: *Deleted

## 2020-07-28 DIAGNOSIS — F319 Bipolar disorder, unspecified: Secondary | ICD-10-CM

## 2020-07-28 MED ORDER — SERTRALINE HCL 50 MG PO TABS: 50.0000 mg | ORAL_TABLET | Freq: Every day | ORAL | 1 refills | Status: DC

## 2020-07-28 NOTE — Telephone Encounter (Signed)
Pt "caretaker" called stating that pt is having increasing depression, sadness, and pt says feeling "blase'". They asked for additional medications for depression. I asked Nichole to schedule him for earlier appointment than 09/09/20. Pt currently on Risperdal and Zoloft and has Parkinson's. Please review and advise. thanks.

## 2020-07-28 NOTE — Telephone Encounter (Signed)
Did he provide information why he is more depressed and anxious.  He recently moved to assisted living facility and that may be contributing factor because he does not know a lot of people there and this is a holiday time.  He can try going up Zoloft 150 until his next appointment.  Joni Reining is trying to get an earlier appointment if he insisted an earlier appointment.  If he agreed to increase the Zoloft then please call the pharmacy new dosage.

## 2020-08-13 ENCOUNTER — Encounter (HOSPITAL_COMMUNITY): Payer: Self-pay | Admitting: Emergency Medicine

## 2020-08-13 ENCOUNTER — Inpatient Hospital Stay (HOSPITAL_COMMUNITY)
Admission: EM | Admit: 2020-08-13 | Discharge: 2020-08-26 | DRG: 521 | Disposition: A | Discharge status: Skilled Nursing Facility | Payer: Medicare HMO | Attending: Family Medicine | Admitting: Family Medicine

## 2020-08-13 ENCOUNTER — Emergency Department (HOSPITAL_COMMUNITY): Payer: Medicare HMO

## 2020-08-13 ENCOUNTER — Other Ambulatory Visit: Payer: Self-pay

## 2020-08-13 DIAGNOSIS — S72001A Fracture of unspecified part of neck of right femur, initial encounter for closed fracture: Secondary | ICD-10-CM | POA: Diagnosis not present

## 2020-08-13 DIAGNOSIS — Z743 Need for continuous supervision: Secondary | ICD-10-CM | POA: Diagnosis not present

## 2020-08-13 DIAGNOSIS — Z66 Do not resuscitate: Secondary | ICD-10-CM | POA: Diagnosis present

## 2020-08-13 DIAGNOSIS — S72009A Fracture of unspecified part of neck of unspecified femur, initial encounter for closed fracture: Secondary | ICD-10-CM | POA: Diagnosis not present

## 2020-08-13 DIAGNOSIS — E785 Hyperlipidemia, unspecified: Secondary | ICD-10-CM | POA: Diagnosis present

## 2020-08-13 DIAGNOSIS — D62 Acute posthemorrhagic anemia: Secondary | ICD-10-CM | POA: Diagnosis not present

## 2020-08-13 DIAGNOSIS — I517 Cardiomegaly: Secondary | ICD-10-CM | POA: Diagnosis not present

## 2020-08-13 DIAGNOSIS — D631 Anemia in chronic kidney disease: Secondary | ICD-10-CM | POA: Diagnosis present

## 2020-08-13 DIAGNOSIS — I480 Paroxysmal atrial fibrillation: Secondary | ICD-10-CM | POA: Diagnosis present

## 2020-08-13 DIAGNOSIS — R410 Disorientation, unspecified: Secondary | ICD-10-CM | POA: Diagnosis not present

## 2020-08-13 DIAGNOSIS — M25572 Pain in left ankle and joints of left foot: Secondary | ICD-10-CM | POA: Diagnosis not present

## 2020-08-13 DIAGNOSIS — E538 Deficiency of other specified B group vitamins: Secondary | ICD-10-CM | POA: Diagnosis present

## 2020-08-13 DIAGNOSIS — R55 Syncope and collapse: Secondary | ICD-10-CM | POA: Diagnosis not present

## 2020-08-13 DIAGNOSIS — G20 Parkinson's disease: Secondary | ICD-10-CM | POA: Diagnosis present

## 2020-08-13 DIAGNOSIS — Z96641 Presence of right artificial hip joint: Secondary | ICD-10-CM | POA: Diagnosis not present

## 2020-08-13 DIAGNOSIS — F419 Anxiety disorder, unspecified: Secondary | ICD-10-CM | POA: Diagnosis present

## 2020-08-13 DIAGNOSIS — I714 Abdominal aortic aneurysm, without rupture: Secondary | ICD-10-CM | POA: Diagnosis not present

## 2020-08-13 DIAGNOSIS — I10 Essential (primary) hypertension: Secondary | ICD-10-CM | POA: Diagnosis not present

## 2020-08-13 DIAGNOSIS — N183 Chronic kidney disease, stage 3 unspecified: Secondary | ICD-10-CM | POA: Diagnosis present

## 2020-08-13 DIAGNOSIS — N1831 Chronic kidney disease, stage 3a: Secondary | ICD-10-CM | POA: Diagnosis present

## 2020-08-13 DIAGNOSIS — Z79899 Other long term (current) drug therapy: Secondary | ICD-10-CM | POA: Diagnosis not present

## 2020-08-13 DIAGNOSIS — F17210 Nicotine dependence, cigarettes, uncomplicated: Secondary | ICD-10-CM | POA: Diagnosis present

## 2020-08-13 DIAGNOSIS — Z6831 Body mass index (BMI) 31.0-31.9, adult: Secondary | ICD-10-CM

## 2020-08-13 DIAGNOSIS — Z20822 Contact with and (suspected) exposure to covid-19: Secondary | ICD-10-CM | POA: Diagnosis not present

## 2020-08-13 DIAGNOSIS — U071 COVID-19: Secondary | ICD-10-CM | POA: Diagnosis not present

## 2020-08-13 DIAGNOSIS — D649 Anemia, unspecified: Secondary | ICD-10-CM | POA: Diagnosis not present

## 2020-08-13 DIAGNOSIS — F332 Major depressive disorder, recurrent severe without psychotic features: Secondary | ICD-10-CM | POA: Diagnosis present

## 2020-08-13 DIAGNOSIS — Z9151 Personal history of suicidal behavior: Secondary | ICD-10-CM

## 2020-08-13 DIAGNOSIS — Z818 Family history of other mental and behavioral disorders: Secondary | ICD-10-CM

## 2020-08-13 DIAGNOSIS — W19XXXA Unspecified fall, initial encounter: Secondary | ICD-10-CM | POA: Diagnosis not present

## 2020-08-13 DIAGNOSIS — E669 Obesity, unspecified: Secondary | ICD-10-CM | POA: Diagnosis present

## 2020-08-13 DIAGNOSIS — Z8679 Personal history of other diseases of the circulatory system: Secondary | ICD-10-CM

## 2020-08-13 DIAGNOSIS — Z471 Aftercare following joint replacement surgery: Secondary | ICD-10-CM | POA: Diagnosis not present

## 2020-08-13 DIAGNOSIS — E860 Dehydration: Secondary | ICD-10-CM | POA: Diagnosis present

## 2020-08-13 DIAGNOSIS — Z7901 Long term (current) use of anticoagulants: Secondary | ICD-10-CM | POA: Diagnosis not present

## 2020-08-13 DIAGNOSIS — N179 Acute kidney failure, unspecified: Secondary | ICD-10-CM | POA: Diagnosis not present

## 2020-08-13 DIAGNOSIS — E7800 Pure hypercholesterolemia, unspecified: Secondary | ICD-10-CM | POA: Diagnosis not present

## 2020-08-13 DIAGNOSIS — M2550 Pain in unspecified joint: Secondary | ICD-10-CM | POA: Diagnosis not present

## 2020-08-13 DIAGNOSIS — S72041A Displaced fracture of base of neck of right femur, initial encounter for closed fracture: Secondary | ICD-10-CM

## 2020-08-13 DIAGNOSIS — S72011A Unspecified intracapsular fracture of right femur, initial encounter for closed fracture: Secondary | ICD-10-CM | POA: Diagnosis not present

## 2020-08-13 DIAGNOSIS — G9341 Metabolic encephalopathy: Secondary | ICD-10-CM | POA: Diagnosis present

## 2020-08-13 DIAGNOSIS — R69 Illness, unspecified: Secondary | ICD-10-CM | POA: Diagnosis not present

## 2020-08-13 DIAGNOSIS — K5900 Constipation, unspecified: Secondary | ICD-10-CM | POA: Diagnosis not present

## 2020-08-13 DIAGNOSIS — R0902 Hypoxemia: Secondary | ICD-10-CM | POA: Diagnosis not present

## 2020-08-13 DIAGNOSIS — M6281 Muscle weakness (generalized): Secondary | ICD-10-CM | POA: Diagnosis not present

## 2020-08-13 DIAGNOSIS — M25551 Pain in right hip: Secondary | ICD-10-CM | POA: Diagnosis not present

## 2020-08-13 DIAGNOSIS — W1830XA Fall on same level, unspecified, initial encounter: Secondary | ICD-10-CM | POA: Diagnosis not present

## 2020-08-13 DIAGNOSIS — I959 Hypotension, unspecified: Secondary | ICD-10-CM | POA: Diagnosis not present

## 2020-08-13 DIAGNOSIS — Z7401 Bed confinement status: Secondary | ICD-10-CM | POA: Diagnosis not present

## 2020-08-13 LAB — CBC WITH DIFFERENTIAL/PLATELET
Abs Immature Granulocytes: 0.03 10*3/uL (ref 0.00–0.07)
Basophils Absolute: 0 10*3/uL (ref 0.0–0.1)
Basophils Relative: 0 %
Eosinophils Absolute: 0 10*3/uL (ref 0.0–0.5)
Eosinophils Relative: 0 %
HCT: 39.3 % (ref 39.0–52.0)
Hemoglobin: 12.2 g/dL — ABNORMAL LOW (ref 13.0–17.0)
Immature Granulocytes: 1 %
Lymphocytes Relative: 13 %
Lymphs Abs: 0.7 10*3/uL (ref 0.7–4.0)
MCH: 29.6 pg (ref 26.0–34.0)
MCHC: 31 g/dL (ref 30.0–36.0)
MCV: 95.4 fL (ref 80.0–100.0)
Monocytes Absolute: 0.6 10*3/uL (ref 0.1–1.0)
Monocytes Relative: 11 %
Neutro Abs: 4.2 10*3/uL (ref 1.7–7.7)
Neutrophils Relative %: 75 %
Platelets: 155 10*3/uL (ref 150–400)
RBC: 4.12 MIL/uL — ABNORMAL LOW (ref 4.22–5.81)
RDW: 12.2 % (ref 11.5–15.5)
WBC: 5.5 10*3/uL (ref 4.0–10.5)
nRBC: 0 % (ref 0.0–0.2)

## 2020-08-13 LAB — BASIC METABOLIC PANEL
Anion gap: 10 (ref 5–15)
BUN: 46 mg/dL — ABNORMAL HIGH (ref 8–23)
CO2: 27 mmol/L (ref 22–32)
Calcium: 10.9 mg/dL — ABNORMAL HIGH (ref 8.9–10.3)
Chloride: 105 mmol/L (ref 98–111)
Creatinine, Ser: 2.36 mg/dL — ABNORMAL HIGH (ref 0.61–1.24)
GFR, Estimated: 27 mL/min — ABNORMAL LOW (ref >60)
Glucose, Bld: 72 mg/dL (ref 70–99)
Potassium: 4.8 mmol/L (ref 3.5–5.1)
Sodium: 142 mmol/L (ref 135–145)

## 2020-08-13 LAB — RESP PANEL BY RT-PCR (FLU A&B, COVID) ARPGX2
Influenza A by PCR: NEGATIVE
Influenza B by PCR: NEGATIVE
SARS Coronavirus 2 by RT PCR: NEGATIVE

## 2020-08-13 LAB — TROPONIN I (HIGH SENSITIVITY)
Troponin I (High Sensitivity): 9 ng/L (ref <18)
Troponin I (High Sensitivity): 8 ng/L (ref <18)

## 2020-08-13 MED ORDER — HYDROCODONE-ACETAMINOPHEN 5-325 MG PO TABS
1.0000 | ORAL_TABLET | Freq: Four times a day (QID) | ORAL | Status: DC | PRN
Start: 2020-08-13 — End: 2020-08-26
  Administered 2020-08-15 – 2020-08-24 (×4): 1 via ORAL
  Filled 2020-08-13 (×2): qty 1
  Filled 2020-08-13: qty 2
  Filled 2020-08-13 (×2): qty 1

## 2020-08-13 MED ORDER — SIMVASTATIN 10 MG PO TABS: 10.0000 mg | ORAL_TABLET | Freq: Every day | ORAL | Status: DC

## 2020-08-13 MED ORDER — CARBIDOPA-LEVODOPA 25-100 MG PO TABS
1.0000 | ORAL_TABLET | Freq: Three times a day (TID) | ORAL | Status: DC
  Filled 2020-08-13 (×2): qty 1

## 2020-08-13 MED ORDER — SODIUM CHLORIDE 0.9 % IV BOLUS
1000.0000 mL | Freq: Once | INTRAVENOUS | Status: AC
  Administered 2020-08-13: 1000 mL via INTRAVENOUS

## 2020-08-13 MED ORDER — SERTRALINE HCL 50 MG PO TABS
50.0000 mg | ORAL_TABLET | Freq: Every day | ORAL | Status: DC
Start: 2020-08-14 — End: 2020-08-13

## 2020-08-13 MED ORDER — FENOFIBRATE 54 MG PO TABS: 54.0000 mg | ORAL_TABLET | Freq: Every day | ORAL | Status: DC

## 2020-08-13 MED ORDER — RISPERIDONE 0.5 MG PO TABS: 0.5000 mg | ORAL_TABLET | Freq: Every day | ORAL | Status: DC

## 2020-08-13 MED ORDER — SODIUM CHLORIDE 0.9% FLUSH: 3.0000 mL | Freq: Two times a day (BID) | INTRAVENOUS | Status: DC

## 2020-08-13 MED ORDER — MORPHINE SULFATE (PF) 4 MG/ML IV SOLN
4.0000 mg | INTRAVENOUS | Status: DC | PRN
  Administered 2020-08-13: 4 mg via INTRAVENOUS
  Filled 2020-08-13: qty 1

## 2020-08-13 MED ORDER — SERTRALINE HCL 50 MG PO TABS
50.0000 mg | ORAL_TABLET | Freq: Every day | ORAL | Status: DC
  Filled 2020-08-13: qty 1

## 2020-08-13 MED ORDER — SODIUM CHLORIDE 0.9 % IV SOLN: INTRAVENOUS | Status: AC

## 2020-08-13 MED ORDER — MORPHINE SULFATE (PF) 2 MG/ML IV SOLN
0.5000 mg | INTRAVENOUS | Status: DC | PRN
  Administered 2020-08-14: 0.5 mg via INTRAVENOUS
  Filled 2020-08-13: qty 1

## 2020-08-13 MED ORDER — HEPARIN SODIUM (PORCINE) 5000 UNIT/ML IJ SOLN
5000.0000 [IU] | Freq: Three times a day (TID) | INTRAMUSCULAR | Status: DC
  Administered 2020-08-13 – 2020-08-14 (×3): 5000 [IU] via SUBCUTANEOUS
  Filled 2020-08-13 (×3): qty 1

## 2020-08-13 MED ORDER — SENNOSIDES-DOCUSATE SODIUM 8.6-50 MG PO TABS: 1.0000 | ORAL_TABLET | Freq: Every evening | ORAL | Status: DC | PRN
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
## 2020-08-13 NOTE — ED Triage Notes (Signed)
BIBA Per EMS:  Pt coming from [place] with an unwitnessed fall  A&Ox4 no LOC, denies blood thinners, denies hitting head  R hip pain is only complaints  Hx hypotension  99/57 BP  84HR 95%RA 140CBG

## 2020-08-13 NOTE — ED Notes (Signed)
nolen, lindamood, [phone]
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
## 2020-08-14 ENCOUNTER — Encounter (HOSPITAL_COMMUNITY)
Admission: EM | Disposition: A | Discharge status: Skilled Nursing Facility | Payer: Self-pay | Source: Home / Self Care | Attending: Family Medicine

## 2020-08-14 ENCOUNTER — Inpatient Hospital Stay (HOSPITAL_COMMUNITY): Payer: Medicare HMO

## 2020-08-14 ENCOUNTER — Inpatient Hospital Stay (HOSPITAL_COMMUNITY): Payer: Medicare HMO | Admitting: Certified Registered Nurse Anesthetist

## 2020-08-14 DIAGNOSIS — S72001A Fracture of unspecified part of neck of right femur, initial encounter for closed fracture: Secondary | ICD-10-CM | POA: Diagnosis not present

## 2020-08-14 DIAGNOSIS — N1831 Chronic kidney disease, stage 3a: Secondary | ICD-10-CM | POA: Diagnosis not present

## 2020-08-14 DIAGNOSIS — N179 Acute kidney failure, unspecified: Secondary | ICD-10-CM | POA: Diagnosis not present

## 2020-08-14 DIAGNOSIS — I517 Cardiomegaly: Secondary | ICD-10-CM | POA: Diagnosis not present

## 2020-08-14 HISTORY — PX: TOTAL HIP ARTHROPLASTY: SHX124

## 2020-08-14 LAB — CBG MONITORING, ED: Glucose-Capillary: 122 mg/dL — ABNORMAL HIGH (ref 70–99)

## 2020-08-14 SURGERY — ARTHROPLASTY, HIP, TOTAL, ANTERIOR APPROACH
Anesthesia: Spinal | Site: Hip | Laterality: Right

## 2020-08-14 MED ORDER — CEFAZOLIN SODIUM-DEXTROSE 2-4 GM/100ML-% IV SOLN
INTRAVENOUS | Status: AC
  Filled 2020-08-14: qty 100

## 2020-08-14 MED ORDER — CARBIDOPA-LEVODOPA 25-100 MG PO TABS
1.0000 | ORAL_TABLET | Freq: Three times a day (TID) | ORAL | Status: DC
  Administered 2020-08-15 – 2020-08-26 (×32): 1 via ORAL
  Filled 2020-08-14 (×33): qty 1

## 2020-08-14 MED ORDER — MENTHOL 3 MG MT LOZG
1.0000 | LOZENGE | OROMUCOSAL | Status: DC | PRN
  Administered 2020-08-24: 3 mg via ORAL
  Filled 2020-08-14: qty 9

## 2020-08-14 MED ORDER — FENTANYL CITRATE (PF) 100 MCG/2ML IJ SOLN: 25.0000 ug | INTRAMUSCULAR | Status: DC | PRN

## 2020-08-14 MED ORDER — PROPOFOL 10 MG/ML IV BOLUS
INTRAVENOUS | Status: AC
  Filled 2020-08-14: qty 20

## 2020-08-14 MED ORDER — PROPOFOL 500 MG/50ML IV EMUL
INTRAVENOUS | Status: DC | PRN
  Administered 2020-08-14: 75 ug/kg/min via INTRAVENOUS

## 2020-08-14 MED ORDER — POVIDONE-IODINE 10 % EX SWAB: 2.0000 "application " | Freq: Once | CUTANEOUS | Status: DC

## 2020-08-14 MED ORDER — ONDANSETRON HCL 4 MG/2ML IJ SOLN: 4.0000 mg | Freq: Four times a day (QID) | INTRAMUSCULAR | Status: DC | PRN

## 2020-08-14 MED ORDER — DEXAMETHASONE SODIUM PHOSPHATE 10 MG/ML IJ SOLN
INTRAMUSCULAR | Status: AC
  Filled 2020-08-14: qty 1

## 2020-08-14 MED ORDER — FENTANYL CITRATE (PF) 100 MCG/2ML IJ SOLN
INTRAMUSCULAR | Status: AC
  Filled 2020-08-14: qty 2

## 2020-08-14 MED ORDER — FENTANYL CITRATE (PF) 100 MCG/2ML IJ SOLN
INTRAMUSCULAR | Status: DC | PRN
  Administered 2020-08-14 (×2): 50 ug via INTRAVENOUS

## 2020-08-14 MED ORDER — ORAL CARE MOUTH RINSE
15.0000 mL | Freq: Two times a day (BID) | OROMUCOSAL | Status: DC
  Administered 2020-08-15 – 2020-08-26 (×22): 15 mL via OROMUCOSAL

## 2020-08-14 MED ORDER — 0.9 % SODIUM CHLORIDE (POUR BTL) OPTIME
TOPICAL | Status: DC | PRN
  Administered 2020-08-14: 1000 mL

## 2020-08-14 MED ORDER — ONDANSETRON HCL 4 MG PO TABS: 4.0000 mg | ORAL_TABLET | Freq: Four times a day (QID) | ORAL | Status: DC | PRN

## 2020-08-14 MED ORDER — ISOPROPYL ALCOHOL 70 % SOLN
Status: AC
  Filled 2020-08-14: qty 480

## 2020-08-14 MED ORDER — BUPIVACAINE-EPINEPHRINE (PF) 0.25% -1:200000 IJ SOLN
INTRAMUSCULAR | Status: AC
  Filled 2020-08-14: qty 30

## 2020-08-14 MED ORDER — LACTATED RINGERS IV SOLN: INTRAVENOUS | Status: DC | PRN

## 2020-08-14 MED ORDER — RISPERIDONE 0.5 MG PO TABS
0.5000 mg | ORAL_TABLET | Freq: Every day | ORAL | Status: DC
Start: 2020-08-14 — End: 2020-08-26
  Administered 2020-08-14 – 2020-08-25 (×12): 0.5 mg via ORAL
  Filled 2020-08-14 (×12): qty 1

## 2020-08-14 MED ORDER — CEFAZOLIN SODIUM-DEXTROSE 2-4 GM/100ML-% IV SOLN
2.0000 g | INTRAVENOUS | Status: AC
  Administered 2020-08-14: 2 g via INTRAVENOUS

## 2020-08-14 MED ORDER — CEFAZOLIN SODIUM-DEXTROSE 2-4 GM/100ML-% IV SOLN
2.0000 g | Freq: Four times a day (QID) | INTRAVENOUS | Status: AC
  Administered 2020-08-14: 2 g via INTRAVENOUS
  Filled 2020-08-14 (×2): qty 100

## 2020-08-14 MED ORDER — METOCLOPRAMIDE HCL 5 MG/ML IJ SOLN: 5.0000 mg | Freq: Three times a day (TID) | INTRAMUSCULAR | Status: DC | PRN

## 2020-08-14 MED ORDER — ONDANSETRON HCL 4 MG/2ML IJ SOLN: 4.0000 mg | Freq: Once | INTRAMUSCULAR | Status: DC | PRN

## 2020-08-14 MED ORDER — TRANEXAMIC ACID-NACL 1000-0.7 MG/100ML-% IV SOLN
INTRAVENOUS | Status: AC
  Filled 2020-08-14: qty 100

## 2020-08-14 MED ORDER — ONDANSETRON HCL 4 MG/2ML IJ SOLN
INTRAMUSCULAR | Status: AC
  Filled 2020-08-14: qty 2

## 2020-08-14 MED ORDER — ISOPROPYL ALCOHOL 70 % SOLN
Status: DC | PRN
  Administered 2020-08-14: 1 via TOPICAL

## 2020-08-14 MED ORDER — APIXABAN 2.5 MG PO TABS: 2.5000 mg | ORAL_TABLET | Freq: Two times a day (BID) | ORAL | Status: DC

## 2020-08-14 MED ORDER — KETOROLAC TROMETHAMINE 30 MG/ML IJ SOLN
INTRAMUSCULAR | Status: DC | PRN
  Administered 2020-08-14: 30 mg

## 2020-08-14 MED ORDER — APIXABAN 2.5 MG PO TABS
2.5000 mg | ORAL_TABLET | Freq: Two times a day (BID) | ORAL | Status: DC
  Administered 2020-08-15: 2.5 mg via ORAL
  Filled 2020-08-14 (×2): qty 1

## 2020-08-14 MED ORDER — PROPOFOL 1000 MG/100ML IV EMUL
INTRAVENOUS | Status: AC
  Filled 2020-08-14: qty 100

## 2020-08-14 MED ORDER — ALBUMIN HUMAN 5 % IV SOLN: INTRAVENOUS | Status: DC | PRN

## 2020-08-14 MED ORDER — CHLORHEXIDINE GLUCONATE CLOTH 2 % EX PADS
6.0000 | MEDICATED_PAD | Freq: Every day | CUTANEOUS | Status: DC
  Administered 2020-08-14 – 2020-08-17 (×4): 6 via TOPICAL

## 2020-08-14 MED ORDER — WATER FOR IRRIGATION, STERILE IR SOLN
Status: DC | PRN
  Administered 2020-08-14: 2000 mL

## 2020-08-14 MED ORDER — TRANEXAMIC ACID-NACL 1000-0.7 MG/100ML-% IV SOLN
1000.0000 mg | INTRAVENOUS | Status: AC
  Administered 2020-08-14: 1000 mg via INTRAVENOUS

## 2020-08-14 MED ORDER — CHLORHEXIDINE GLUCONATE 4 % EX LIQD: 60.0000 mL | Freq: Once | CUTANEOUS | Status: DC

## 2020-08-14 MED ORDER — SODIUM CHLORIDE 0.9 % IV SOLN: INTRAVENOUS | Status: AC

## 2020-08-14 MED ORDER — BUPIVACAINE IN DEXTROSE 0.75-8.25 % IT SOLN
INTRATHECAL | Status: DC | PRN
  Administered 2020-08-14: 2 mL via INTRATHECAL

## 2020-08-14 MED ORDER — ONDANSETRON HCL 4 MG/2ML IJ SOLN
INTRAMUSCULAR | Status: DC | PRN
  Administered 2020-08-14: 4 mg via INTRAVENOUS

## 2020-08-14 MED ORDER — PROPOFOL 500 MG/50ML IV EMUL
INTRAVENOUS | Status: DC | PRN
  Administered 2020-08-14: 30 mg via INTRAVENOUS

## 2020-08-14 MED ORDER — DOCUSATE SODIUM 100 MG PO CAPS
100.0000 mg | ORAL_CAPSULE | Freq: Two times a day (BID) | ORAL | Status: DC
  Administered 2020-08-14 – 2020-08-26 (×21): 100 mg via ORAL
  Filled 2020-08-14 (×21): qty 1

## 2020-08-14 MED ORDER — KETOROLAC TROMETHAMINE 30 MG/ML IJ SOLN
INTRAMUSCULAR | Status: AC
  Filled 2020-08-14: qty 1

## 2020-08-14 MED ORDER — PHENOL 1.4 % MT LIQD: 1.0000 | OROMUCOSAL | Status: DC | PRN

## 2020-08-14 MED ORDER — METOCLOPRAMIDE HCL 5 MG PO TABS: 5.0000 mg | ORAL_TABLET | Freq: Three times a day (TID) | ORAL | Status: DC | PRN

## 2020-08-14 MED ORDER — SODIUM CHLORIDE 0.9 % IR SOLN
Status: DC | PRN
  Administered 2020-08-14: 1000 mL

## 2020-08-14 MED ORDER — LACTATED RINGERS IV SOLN: INTRAVENOUS | Status: DC

## 2020-08-14 MED ORDER — SERTRALINE HCL 50 MG PO TABS
50.0000 mg | ORAL_TABLET | Freq: Every day | ORAL | Status: DC
  Administered 2020-08-15 – 2020-08-26 (×12): 50 mg via ORAL
  Filled 2020-08-14 (×12): qty 1

## 2020-08-14 MED ORDER — PHENYLEPHRINE HCL-NACL 10-0.9 MG/250ML-% IV SOLN
INTRAVENOUS | Status: DC | PRN
  Administered 2020-08-14: 25 ug/min via INTRAVENOUS

## 2020-08-14 MED ORDER — DEXAMETHASONE SODIUM PHOSPHATE 10 MG/ML IJ SOLN
INTRAMUSCULAR | Status: DC | PRN
  Administered 2020-08-14: 10 mg via INTRAVENOUS

## 2020-08-14 MED ORDER — SODIUM CHLORIDE (PF) 0.9 % IJ SOLN
INTRAMUSCULAR | Status: AC
  Filled 2020-08-14: qty 30

## 2020-08-14 MED ORDER — SODIUM CHLORIDE (PF) 0.9 % IJ SOLN
INTRAMUSCULAR | Status: DC | PRN
  Administered 2020-08-14: 30 mL via INTRAVENOUS

## 2020-08-14 MED ORDER — OXYCODONE HCL 5 MG/5ML PO SOLN: 5.0000 mg | Freq: Once | ORAL | Status: DC | PRN

## 2020-08-14 MED ORDER — OXYCODONE HCL 5 MG PO TABS: 5.0000 mg | ORAL_TABLET | Freq: Once | ORAL | Status: DC | PRN

## 2020-08-14 MED ORDER — BUPIVACAINE-EPINEPHRINE 0.25% -1:200000 IJ SOLN
INTRAMUSCULAR | Status: DC | PRN
  Administered 2020-08-14: 30 mL

## 2020-08-14 SURGICAL SUPPLY — 67 items
ADH SKN CLS APL DERMABOND .7 (GAUZE/BANDAGES/DRESSINGS) ×2
APL PRP STRL LF DISP 70% ISPRP (MISCELLANEOUS) ×1
ARTICULEZE HEAD (Hips) ×2 IMPLANT
BAG DECANTER FOR FLEXI CONT (MISCELLANEOUS) IMPLANT
BAG SPEC THK2 15X12 ZIP CLS (MISCELLANEOUS)
BAG ZIPLOCK 12X15 (MISCELLANEOUS) IMPLANT
BLADE SURG SZ10 CARB STEEL (BLADE) IMPLANT
CHLORAPREP W/TINT 26 (MISCELLANEOUS) ×2 IMPLANT
COVER PERINEAL POST (MISCELLANEOUS) ×2 IMPLANT
COVER SURGICAL LIGHT HANDLE (MISCELLANEOUS) ×2 IMPLANT
COVER WAND RF STERILE (DRAPES) IMPLANT
CUP ACET PINNACLE SECTR 60MM (Hips) IMPLANT
DECANTER SPIKE VIAL GLASS SM (MISCELLANEOUS) ×2 IMPLANT
DERMABOND ADVANCED (GAUZE/BANDAGES/DRESSINGS) ×2
DERMABOND ADVANCED .7 DNX12 (GAUZE/BANDAGES/DRESSINGS) ×2 IMPLANT
DRAPE IMP U-DRAPE 54X76 (DRAPES) ×2 IMPLANT
DRAPE SHEET LG 3/4 BI-LAMINATE (DRAPES) ×6 IMPLANT
DRAPE STERI IOBAN 125X83 (DRAPES) IMPLANT
DRAPE U-SHAPE 47X51 STRL (DRAPES) ×4 IMPLANT
DRSG AQUACEL AG ADV 3.5X10 (GAUZE/BANDAGES/DRESSINGS) ×2 IMPLANT
ELECT REM PT RETURN 15FT ADLT (MISCELLANEOUS) ×2 IMPLANT
GAUZE SPONGE 4X4 12PLY STRL (GAUZE/BANDAGES/DRESSINGS) ×2 IMPLANT
GLOVE BIO SURGEON STRL SZ8.5 (GLOVE) ×4 IMPLANT
GLOVE BIOGEL PI IND STRL 8.5 (GLOVE) ×1 IMPLANT
GLOVE BIOGEL PI INDICATOR 8.5 (GLOVE) ×1
GLOVE SRG 8 PF TXTR STRL LF DI (GLOVE) ×1 IMPLANT
GLOVE SURG ENC TEXT LTX SZ7.5 (GLOVE) ×4 IMPLANT
GLOVE SURG UNDER POLY LF SZ8 (GLOVE) ×2
GOWN SPEC L3 XXLG W/TWL (GOWN DISPOSABLE) ×2 IMPLANT
GOWN STRL REUS W/ TWL LRG LVL3 (GOWN DISPOSABLE) ×1 IMPLANT
GOWN STRL REUS W/TWL LRG LVL3 (GOWN DISPOSABLE) ×2
HANDPIECE INTERPULSE COAX TIP (DISPOSABLE) ×2
HEAD ARTICULEZE (Hips) IMPLANT
HOLDER FOLEY CATH W/STRAP (MISCELLANEOUS) ×2 IMPLANT
HOOD PEEL AWAY FLYTE STAYCOOL (MISCELLANEOUS) ×8 IMPLANT
JET LAVAGE IRRISEPT WOUND (IRRIGATION / IRRIGATOR) ×2
KIT TURNOVER KIT A (KITS) IMPLANT
LAVAGE JET IRRISEPT WOUND (IRRIGATION / IRRIGATOR) ×1 IMPLANT
LINER NEUTRAL 58X36MM PLUS4 ×1 IMPLANT
MANIFOLD NEPTUNE II (INSTRUMENTS) ×2 IMPLANT
MARKER SKIN DUAL TIP RULER LAB (MISCELLANEOUS) ×2 IMPLANT
NDL SAFETY ECLIPSE 18X1.5 (NEEDLE) ×1 IMPLANT
NDL SPNL 18GX3.5 QUINCKE PK (NEEDLE) ×1 IMPLANT
NEEDLE HYPO 18GX1.5 SHARP (NEEDLE) ×2
NEEDLE SPNL 18GX3.5 QUINCKE PK (NEEDLE) ×2 IMPLANT
PACK ANTERIOR HIP CUSTOM (KITS) ×2 IMPLANT
PENCIL SMOKE EVACUATOR (MISCELLANEOUS) IMPLANT
PINNSECTOR W/GRIP ACE CUP 60MM (Hips) ×2 IMPLANT
SAW OSC TIP CART 19.5X105X1.3 (SAW) ×2 IMPLANT
SEALER BIPOLAR AQUA 6.0 (INSTRUMENTS) ×2 IMPLANT
SET HNDPC FAN SPRY TIP SCT (DISPOSABLE) ×1 IMPLANT
STAPLER VISISTAT 35W (STAPLE) ×1 IMPLANT
STEM TRI LOC BPS GRIP SZ11 (Hips) IMPLANT
SUT ETHIBOND NAB CT1 #1 30IN (SUTURE) ×4 IMPLANT
SUT MNCRL AB 3-0 PS2 18 (SUTURE) ×2 IMPLANT
SUT MNCRL AB 4-0 PS2 18 (SUTURE) ×2 IMPLANT
SUT MON AB 2-0 CT1 36 (SUTURE) ×4 IMPLANT
SUT STRATAFIX PDO 1 14 VIOLET (SUTURE) ×2
SUT STRATFX PDO 1 14 VIOLET (SUTURE) ×1
SUT VIC AB 2-0 CT1 27 (SUTURE) ×2
SUT VIC AB 2-0 CT1 TAPERPNT 27 (SUTURE) ×1 IMPLANT
SUTURE STRATFX PDO 1 14 VIOLET (SUTURE) ×1 IMPLANT
SYR 3ML LL SCALE MARK (SYRINGE) ×2 IMPLANT
TRAY FOLEY MTR SLVR 16FR STAT (SET/KITS/TRAYS/PACK) IMPLANT
TRI LOC BPS W/GRIP SZ11 (Hips) ×2 IMPLANT
TUBE SUCTION HIGH CAP CLEAR NV (SUCTIONS) ×2 IMPLANT
WATER STERILE IRR 1000ML POUR (IV SOLUTION) ×2 IMPLANT

## 2020-08-14 NOTE — Anesthesia Procedure Notes (Signed)
Spinal  Patient location during procedure: OR Start time: 08/14/2020 1:09 PM End time: 08/14/2020 1:14 PM Staffing Performed: anesthesiologist  Anesthesiologist: Josephine Igo, MD Preanesthetic Checklist Completed: patient identified, IV checked, site marked, risks and benefits discussed, surgical consent, monitors and equipment checked, pre-op evaluation and timeout performed Spinal Block Patient position: right lateral decubitus Prep: DuraPrep and site prepped and draped Patient monitoring: heart rate, cardiac monitor, continuous pulse ox and blood pressure Approach: midline Location: L4-5 Injection technique: single-shot Needle Needle type: Sprotte  Needle gauge: 24 G Needle length: 9 cm Needle insertion depth: 7 cm Assessment Sensory level: T4 Additional Notes Patient tolerated procedure well. Adequate sensory level.

## 2020-08-14 NOTE — Op Note (Signed)
OPERATIVE REPORT  SURGEON: Rod Can, MD   ASSISTANT: Cherlynn June, PA-C  PREOPERATIVE DIAGNOSIS: Displaced Right femoral neck fracture.   POSTOPERATIVE DIAGNOSIS: Displaced Right femoral neck fracture.   PROCEDURE: Right total hip arthroplasty, anterior approach.   IMPLANTS: DePuy Tri Lock stem, size 11, hi offset. DePuy Pinnacle Cup, size 60 mm. DePuy Altrx liner, size 36 by 50 mm, +4 neutral. DePuy metal head ball, size 36 + 5 mm.  ANESTHESIA:  MAC and Spinal  ANTIBIOTICS: 2g ancef.  ESTIMATED BLOOD LOSS:-600 mL    DRAINS: None.  COMPLICATIONS: None   CONDITION: PACU - hemodynamically stable.   BRIEF CLINICAL NOTE: Fernando Carr is a 80 y.o. male with a displaced Right femoral neck fracture. The patient was admitted to the hospitalist service and underwent perioperative risk stratification and medical optimization. The risks, benefits, and alternatives to total hip arthroplasty were explained, and the patient elected to proceed.  PROCEDURE IN DETAIL: The patient was taken to the operating room and general anesthesia was induced on the hospital bed.  The patient was then positioned on the [place] table.  All bony prominences were well padded.  The hip was prepped and draped in the normal sterile surgical fashion.  A time-out was called verifying side and site of surgery. Antibiotics were given within 60 minutes of beginning the procedure.  The direct anterior approach to the hip was performed through the Hueter interval.  Lateral femoral circumflex vessels were treated with the Auqumantys. The anterior capsule was exposed and an inverted T capsulotomy was made.  Fracture hematoma was encountered and evacuated. The patient was found to have a comminuted Right subcapital femoral neck fracture.  I freshened the femoral neck cut with a saw.  I removed the femoral neck fragment.  A corkscrew was placed into the head and the head was removed.  This was passed to the back table  and was measured.   Acetabular exposure was achieved, and the pulvinar and labrum were excised. Sequential reaming of the acetabulum was then performed up to a size 59 mm reamer. A 60 mm cup was then opened and impacted into place at approximately 40 degrees of abduction and 20 degrees of anteversion. The final polyethylene liner was impacted into place.    I then gained femoral exposure taking care to protect the abductors and greater trochanter.  This was performed using standard external rotation, extension, and adduction.  The capsule was peeled off the inner aspect of the greater trochanter, taking care to preserve the short external rotators. A cookie cutter was used to enter the femoral canal, and then the femoral canal finder was used to confirm location.  I then sequentially broached up to a size 11.  Calcar planer was used on the femoral neck remnant.  I paced a hi neck and a trial head ball. The hip was reduced.  Leg lengths were checked fluoroscopically.  The hip was dislocated and trial components were removed.  I placed the real stem followed by the real spacer and head ball.  The hip was reduced.  Fluoroscopy was used to confirm component position and leg lengths.  At 90 degrees of external rotation and extension, the hip was stable to an anterior directed force.   The wound was copiously irrigated with Irrisept solution and normal saline using pule lavage.  Marcaine solution was injected into the periarticular soft tissue.  The wound was closed in layers using #1 Vicryl and V-Loc for the fascia, 2-0 Vicryl for the  subcutaneous fat, 2-0 Monocryl for the deep dermal layer, 3-0 running Monocryl subcuticular stitch and glue for the skin.  Once the glue was fully dried, an Aquacell Ag dressing was applied.  The patient was then awakened from anesthesia and transported to the recovery room in stable condition.  Sponge, needle, and instrument counts were correct at the end of the case x2.  The patient  tolerated the procedure well and there were no known complications.  Please note that a surgical assistant was a medical necessity for this procedure to perform it in a safe and expeditious manner. Assistant was necessary to provide appropriate retraction of vital neurovascular structures, to prevent femoral fracture, and to allow for anatomic placement of the prosthesis.  Postoperatively, the patient will be readmitted to the hospitalist. Weight bear as tolerated right lower extremity. Begin Eliquis for DVT prophylaxis tomorrow morning. Mobilize out of bed with physical therapy. He will undergo disposition planning. Return to the office in 2 weeks for routine postoperative care.

## 2020-08-14 NOTE — Anesthesia Procedure Notes (Signed)
Arterial Line Insertion Start/End1/14/2022 11:55 AM, 08/14/2020 12:05 PM Performed by: Josephine Igo, MD, anesthesiologist  Patient location: Pre-op. Preanesthetic checklist: patient identified, IV checked, site marked, risks and benefits discussed, surgical consent, monitors and equipment checked, pre-op evaluation, timeout performed and anesthesia consent Lidocaine 1% used for infiltration Right, radial was placed Catheter size: 20 G Hand hygiene performed  and maximum sterile barriers used  Allen's test indicative of satisfactory collateral circulation Attempts: 2 Procedure performed without using ultrasound guided technique. Following insertion, dressing applied and Biopatch. Post procedure assessment: normal  Patient tolerated the procedure well with no immediate complications.

## 2020-08-14 NOTE — Interval H&P Note (Signed)
History and Physical Interval Note:  08/14/2020 1:02 PM  Fernando Carr  has presented today for surgery, with the diagnosis of RIGHT HIP FRACTURE.  The various methods of treatment have been discussed with the patient and family. After consideration of risks, benefits and other options for treatment, the patient has consented to  Procedure(s): TOTAL HIP ARTHROPLASTY ANTERIOR APPROACH (Right) as a surgical intervention.  The patient's history has been reviewed, patient examined, no change in status, stable for surgery.  I have reviewed the patient's chart and labs.  Questions were answered to the patient's satisfaction.    The risks, benefits, and alternatives were discussed with the patient. There are risks associated with the surgery including, but not limited to, problems with anesthesia (death), infection, instability (giving out of the joint), dislocation, differences in leg length/angulation/rotation, fracture of bones, loosening or failure of implants, hematoma (blood accumulation) which may require surgical drainage, blood clots, pulmonary embolism, nerve injury (foot drop and lateral thigh numbness), and blood vessel injury. The patient understands these risks and elects to proceed.    Hilton Cork Jerrianne Hartin

## 2020-08-14 NOTE — Anesthesia Preprocedure Evaluation (Addendum)
Anesthesia Evaluation  Patient identified by MRN, date of birth, ID band Patient awake    Reviewed: Allergy & Precautions, NPO status , Patient's Chart, lab work & pertinent test results, reviewed documented beta blocker date and time   Airway Mallampati: II  TM Distance: >3 FB Neck ROM: Full    Dental no notable dental hx. (+) Caps, Loose, Poor Dentition, Missing,    Pulmonary Current Smoker,    Pulmonary exam normal breath sounds clear to auscultation       Cardiovascular hypertension, Pt. on medications and Pt. on home beta blockers + Peripheral Vascular Disease  Normal cardiovascular exam Rhythm:Regular Rate:Normal  Hx/o AAA s/p repair  EKG 08/13/20 NSR, LAD, short PR, anteroseptal infarct  Echo 2/21 1. Cannot r/o variant of HOCM with small resting mid cavitary gradient No SAM. Left ventricular ejection fraction, by estimation, is 65 to 70%. The left ventricle has normal function. The left ventricle has no regional  wall motion abnormalities. There is severe left ventricular hypertrophy. Left ventricular diastolic parameters were normal.  2. Right ventricular systolic function is normal. The right ventricular size is normal.  3. The mitral valve is normal in structure and function. No evidence of mitral valve regurgitation. No evidence of mitral stenosis.  4. The aortic valve was not well visualized. Aortic valve regurgitation  is not visualized. Mild aortic valve sclerosis is present, with no evidence of aortic valve stenosis.  5. The inferior vena cava is normal in size with greater than 50% respiratory variability, suggesting right atrial pressure of 3 mmHg.    Neuro/Psych PSYCHIATRIC DISORDERS Anxiety Depression Bipolar Disorder Parkinson's Disease    GI/Hepatic negative GI ROS, Neg liver ROS,   Endo/Other  Hyperlipidemia Obesity  Renal/GU Renal InsufficiencyRenal disease  negative genitourinary    Musculoskeletal Right hip Fx   Abdominal (+) + obese,   Peds  Hematology   Anesthesia Other Findings   Reproductive/Obstetrics                            Anesthesia Physical Anesthesia Plan  ASA: III  Anesthesia Plan: Spinal   Post-op Pain Management:    Induction: Intravenous  PONV Risk Score and Plan: 1 and Ondansetron and Treatment may vary due to age or medical condition  Airway Management Planned: Natural Airway, Nasal Cannula and Simple Face Mask  Additional Equipment: Arterial line  Intra-op Plan:   Post-operative Plan:   Informed Consent: I have reviewed the patients History and Physical, chart, labs and discussed the procedure including the risks, benefits and alternatives for the proposed anesthesia with the patient or authorized representative who has indicated his/her understanding and acceptance.   Patient has DNR.  Discussed DNR with patient and Suspend DNR.   Dental advisory given  Plan Discussed with: CRNA and Anesthesiologist  Anesthesia Plan Comments:        Anesthesia Quick Evaluation

## 2020-08-14 NOTE — Transfer of Care (Signed)
Immediate Anesthesia Transfer of Care Note  Patient: Fernando Carr  Procedure(s) Performed: TOTAL HIP ARTHROPLASTY ANTERIOR APPROACH (Right Hip)  Patient Location: PACU  Anesthesia Type:Spinal  Level of Consciousness: awake, alert  and oriented  Airway & Oxygen Therapy: Patient Spontanous Breathing and Patient connected to face mask  Post-op Assessment: Report given to RN and Post -op Vital signs reviewed and stable  Post vital signs: Reviewed and stable  Last Vitals:  Vitals Value Taken Time  BP 177/85 08/14/20 1515  Temp    Pulse 72 08/14/20 1518  Resp 11 08/14/20 1518  SpO2 100 % 08/14/20 1518  Vitals shown include unvalidated device data.  Last Pain:  Vitals:   08/14/20 1040  TempSrc: Oral  PainSc:          Complications: No complications documented.

## 2020-08-14 NOTE — Progress Notes (Signed)
PROGRESS NOTE  Fernando Carr [id number] DOB: June 01, 1941 DOA: 08/13/2020 PCP: [name], MD   LOS: 1 day   Brief narrative: This is a 80 year old male  with past medical history of Parkinson disease, atrial fibrillation not on Eliquis, CKD 3a, AAA s/p repair 2015, hyperlipidemia, bipolar disorder, syncope, severe LVH who presented from [facility] with concern for syncope versus near syncope event with right hip pain.  Patient states he typically ambulates with a Rollator.  States that he was having breakfast when he was walking away from the table and believes that he had a an episode of loss of consciousness and woke up soon after on the floor.  Does not believe he hit his head.  In the ED, Initial BP 99/57 which improved with IV fluids.  Afebrile and hemodynamically stable on room air. Notable Labs: Sodium 142, K4.8, BUN 46, creatinine 2.36, calcium 10.9, WBC 5.5, Hb 12.2, platelets 155, COVID-19 and flu negative. Notable Imaging: Right hip x-ray- mildly displaced proximal right femoral neck fracture. Patient received 1 L NS bolus and morphine.  Orthopedic surgery was consulted in the ED. Patient was then admitted to the hospital.   Assessment/Plan:  Principal Problem:   Hip fracture ([name]) Active Problems:   CRD (chronic renal disease), stage III ([name])   MDD (major depressive disorder), recurrent severe, without psychosis ([name])   Syncope   AKI (acute kidney injury) ([name])   LVH (left ventricular hypertrophy)  Proximal right femoral neck mildly displaced fracture s/p mechanical fall  Patient has been seen by orthopedics.  RCRI: 1 risk factor (preoperative creatinine >2) and is at 0.9 % risk of perioperative cardiac event for this noncardiac surgery. Likely plan for surgical orthopedic intervention.  Continue pain management.  Suspected syncope, concern for orthostatic hypotension  continue IV fluids. Ambulate after surgery.  AKI on CKD 3a.  Patient presented with serum Cr  2.36, baseline 1.45 in April.  Continue IV fluids.  No new labs from today.  We will recheck in am.  Severe LVH/Atrial fibrillation Does not take Eliquis, though it is on his med list. Follows up with cardiology as outpatient. [place] for surgery.   Anxiety/depression Resume sertaline from am  History of parkinsonism. On carbidopa/levodopa. resume  DVT prophylaxis: heparin injection 5,000 Units Start: 08/13/20 1500   Code Status:  Full code  Family Communication: None  Status is: Inpatient  Remains inpatient appropriate because:Unsafe d/c plan, IV treatments appropriate due to intensity of illness or inability to take PO and Possible need for surgical intervention   Dispo: The patient is from: ALF/independent living              Anticipated d/c is to: ALF              Anticipated d/c date is: 2 days              Patient currently is not medically stable to d/c.    Consultants:  Orthopedics  Procedures:  None yet  Anti-infectives:  . None  Anti-infectives (From admission, onward)   None      Subjective: Today, patient was seen and examined at bedside.  Complains of mild right hip pain especially on movement.  Denies chest pain, shortness of breath, fever or chills   Objective: Vitals:   08/14/20 0600 08/14/20 0615  BP: (!) 148/76 133/66  Pulse: 63 60  Resp: 18 20  Temp:    SpO2: 93% 93%   No intake or output data in the 24  hours ending 08/14/20 0836 Filed Weights   08/13/20 0956  Weight: 102.1 kg   Body mass index is 30.52 kg/m.   Physical Exam:  GENERAL: Patient is alert awake and oriented. Not in obvious distress.  Obese HENT: No scleral pallor or icterus. Pupils equally reactive to light. Oral mucosa is moist NECK: is supple, no gross swelling noted. CHEST: Clear to auscultation. No crackles or wheezes.  Diminished breath sounds bilaterally. CVS: S1 and S2 heard, no murmur.  ABDOMEN: Soft, non-tender, bowel sounds are present. EXTREMITIES:  Right hip tenderness.  Right upper extremity tremors CNS: Cranial nerves are intact. No focal motor deficits.  Right upper extremity tremors SKIN: warm and dry without rashes.  Data Review: I have personally reviewed the following laboratory data and studies,  CBC: Recent Labs  Lab 08/13/20 1051  WBC 5.5  NEUTROABS 4.2  HGB 12.2*  HCT 39.3  MCV 95.4  PLT 202   Basic Metabolic Panel: Recent Labs  Lab 08/13/20 1051  NA 142  K 4.8  CL 105  CO2 27  GLUCOSE 72  BUN 46*  CREATININE 2.36*  CALCIUM 10.9*   Liver Function Tests: No results for input(s): AST, ALT, ALKPHOS, BILITOT, PROT, ALBUMIN in the last 168 hours. No results for input(s): LIPASE, AMYLASE in the last 168 hours. No results for input(s): AMMONIA in the last 168 hours. Cardiac Enzymes: No results for input(s): CKTOTAL, CKMB, CKMBINDEX, TROPONINI in the last 168 hours. BNP (last 3 results) No results for input(s): BNP in the last 8760 hours.  ProBNP (last 3 results) No results for input(s): PROBNP in the last 8760 hours.  CBG: Recent Labs  Lab 08/14/20 0517  GLUCAP 122*   Recent Results (from the past 240 hour(s))  Resp Panel by RT-PCR (Flu A&B, Covid) Nasopharyngeal Swab     Status: None   Collection Time: 08/13/20 11:58 AM   Specimen: Nasopharyngeal Swab; Nasopharyngeal(NP) swabs in vial transport medium  Result Value Ref Range Status   SARS Coronavirus 2 by RT PCR NEGATIVE NEGATIVE Final    Comment: (NOTE) SARS-CoV-2 target nucleic acids are NOT DETECTED.  The SARS-CoV-2 RNA is generally detectable in upper respiratory specimens during the acute phase of infection. The lowest concentration of SARS-CoV-2 viral copies this assay can detect is 138 copies/mL. A negative result does not preclude SARS-Cov-2 infection and should not be used as the sole basis for treatment or other patient management decisions. A negative result may occur with  improper specimen collection/handling, submission of  specimen other than nasopharyngeal swab, presence of viral mutation(s) within the areas targeted by this assay, and inadequate number of viral copies(<138 copies/mL). A negative result must be combined with clinical observations, patient history, and epidemiological information. The expected result is Negative.  Fact Sheet for Patients:  EntrepreneurPulse.com.au  Fact Sheet for Healthcare Providers:  IncredibleEmployment.be  This test is no t yet approved or cleared by the [place] FDA and  has been authorized for detection and/or diagnosis of SARS-CoV-2 by FDA under an Emergency Use Authorization (EUA). This EUA will remain  in effect (meaning this test can be used) for the duration of the COVID-19 declaration under Section 564(b)(1) of the Act, 21 U.S.C.section 360bbb-3(b)(1), unless the authorization is terminated  or revoked sooner.       Influenza A by PCR NEGATIVE NEGATIVE Final   Influenza B by PCR NEGATIVE NEGATIVE Final    Comment: (NOTE) The Xpert Xpress SARS-CoV-2/FLU/RSV plus assay is intended as an aid in the diagnosis  of influenza from Nasopharyngeal swab specimens and should not be used as a sole basis for treatment. Nasal washings and aspirates are unacceptable for Xpert Xpress SARS-CoV-2/FLU/RSV testing.  Fact Sheet for Patients: EntrepreneurPulse.com.au  Fact Sheet for Healthcare Providers: IncredibleEmployment.be  This test is not yet approved or cleared by the [place] FDA and has been authorized for detection and/or diagnosis of SARS-CoV-2 by FDA under an Emergency Use Authorization (EUA). This EUA will remain in effect (meaning this test can be used) for the duration of the COVID-19 declaration under Section 564(b)(1) of the Act, 21 U.S.C. section 360bbb-3(b)(1), unless the authorization is terminated or revoked.  Performed at [facility] [place]  [address], [place], [place] [address]      Studies: DG Hip [place] or [place] Pelvis 1 View Right  Result Date: 08/13/2020 CLINICAL DATA:  Right hip pain after fall. EXAM: DG HIP (WITH OR WITHOUT PELVIS) 1V PORT RIGHT COMPARISON:  None. FINDINGS: Mildly displaced fracture is seen involving the proximal right femoral neck. IMPRESSION: Mildly displaced proximal right femoral neck fracture. Electronically Signed   By: Marijo Conception M.D.   On: 08/13/2020 10:41      Flora Lipps, MD  [facility] 08/14/2020  If 7PM-7AM, please contact night-coverage

## 2020-08-14 NOTE — Progress Notes (Signed)
Spoke with Fernando Carr, Geriatric Care Manager at [facility], and she states Fernando Carr makes all decisions on his own and signs his own paperwork. He is alert and oriented x4 today. Fernando Carr stated she made Fernando Hyun sister Arbie Cookey aware of today's surgery.

## 2020-08-14 NOTE — Anesthesia Postprocedure Evaluation (Signed)
Anesthesia Post Note  Patient: Fernando Carr  Procedure(s) Performed: TOTAL HIP ARTHROPLASTY ANTERIOR APPROACH (Right Hip)     Patient location during evaluation: PACU Anesthesia Type: Spinal Level of consciousness: oriented and awake and alert Pain management: pain level controlled Vital Signs Assessment: post-procedure vital signs reviewed and stable Respiratory status: spontaneous breathing, respiratory function stable and nonlabored ventilation Cardiovascular status: blood pressure returned to baseline and stable Postop Assessment: no headache, no backache, no apparent nausea or vomiting, spinal receding and patient able to bend at knees Anesthetic complications: no   No complications documented.  Last Vitals:  Vitals:   08/14/20 1533 08/14/20 1545  BP:  (!) 150/83  Pulse: 74 77  Resp: 11 14  Temp:    SpO2: 98% 100%    Last Pain:  Vitals:   08/14/20 1545  TempSrc:   PainSc: 0-No pain                 [name]

## 2020-08-15 DIAGNOSIS — D62 Acute posthemorrhagic anemia: Secondary | ICD-10-CM

## 2020-08-15 DIAGNOSIS — I517 Cardiomegaly: Secondary | ICD-10-CM | POA: Diagnosis not present

## 2020-08-15 DIAGNOSIS — S72001A Fracture of unspecified part of neck of right femur, initial encounter for closed fracture: Secondary | ICD-10-CM | POA: Diagnosis not present

## 2020-08-15 DIAGNOSIS — N1831 Chronic kidney disease, stage 3a: Secondary | ICD-10-CM | POA: Diagnosis not present

## 2020-08-15 DIAGNOSIS — N179 Acute kidney failure, unspecified: Secondary | ICD-10-CM | POA: Diagnosis not present

## 2020-08-15 DIAGNOSIS — G9341 Metabolic encephalopathy: Secondary | ICD-10-CM

## 2020-08-15 LAB — PROTIME-INR
INR: 1.2 (ref 0.8–1.2)
Prothrombin Time: 14.4 seconds (ref 11.4–15.2)

## 2020-08-15 LAB — CBC WITH DIFFERENTIAL/PLATELET
Abs Immature Granulocytes: 0.02 10*3/uL (ref 0.00–0.07)
Basophils Absolute: 0 10*3/uL (ref 0.0–0.1)
Basophils Relative: 0 %
Eosinophils Absolute: 0 10*3/uL (ref 0.0–0.5)
Eosinophils Relative: 0 %
HCT: 24 % — ABNORMAL LOW (ref 39.0–52.0)
Hemoglobin: 7.6 g/dL — ABNORMAL LOW (ref 13.0–17.0)
Immature Granulocytes: 0 %
Lymphocytes Relative: 12 %
Lymphs Abs: 0.8 10*3/uL (ref 0.7–4.0)
MCH: 29.9 pg (ref 26.0–34.0)
MCHC: 31.7 g/dL (ref 30.0–36.0)
MCV: 94.5 fL (ref 80.0–100.0)
Monocytes Absolute: 0.8 10*3/uL (ref 0.1–1.0)
Monocytes Relative: 13 %
Neutro Abs: 4.8 10*3/uL (ref 1.7–7.7)
Neutrophils Relative %: 75 %
Platelets: 127 10*3/uL — ABNORMAL LOW (ref 150–400)
RBC: 2.54 MIL/uL — ABNORMAL LOW (ref 4.22–5.81)
RDW: 12.5 % (ref 11.5–15.5)
WBC: 6.4 10*3/uL (ref 4.0–10.5)
nRBC: 0 % (ref 0.0–0.2)

## 2020-08-15 LAB — BASIC METABOLIC PANEL
Anion gap: 9 (ref 5–15)
Anion gap: 8 (ref 5–15)
BUN: 43 mg/dL — ABNORMAL HIGH (ref 8–23)
BUN: 41 mg/dL — ABNORMAL HIGH (ref 8–23)
CO2: 21 mmol/L — ABNORMAL LOW (ref 22–32)
CO2: 21 mmol/L — ABNORMAL LOW (ref 22–32)
Calcium: 9.7 mg/dL (ref 8.9–10.3)
Calcium: 9.8 mg/dL (ref 8.9–10.3)
Chloride: 108 mmol/L (ref 98–111)
Chloride: 108 mmol/L (ref 98–111)
Creatinine, Ser: 2.06 mg/dL — ABNORMAL HIGH (ref 0.61–1.24)
Creatinine, Ser: 2.24 mg/dL — ABNORMAL HIGH (ref 0.61–1.24)
GFR, Estimated: 32 mL/min — ABNORMAL LOW (ref >60)
GFR, Estimated: 29 mL/min — ABNORMAL LOW (ref >60)
Glucose, Bld: 115 mg/dL — ABNORMAL HIGH (ref 70–99)
Glucose, Bld: 121 mg/dL — ABNORMAL HIGH (ref 70–99)
Potassium: 4.7 mmol/L (ref 3.5–5.1)
Potassium: 4.6 mmol/L (ref 3.5–5.1)
Sodium: 138 mmol/L (ref 135–145)
Sodium: 137 mmol/L (ref 135–145)

## 2020-08-15 LAB — BLOOD GAS, ARTERIAL
Acid-base deficit: 3.2 mmol/L — ABNORMAL HIGH (ref 0.0–2.0)
Bicarbonate: 21.4 mmol/L (ref 20.0–28.0)
Drawn by: 25770
FIO2: 28
O2 Content: 2 L/min
O2 Saturation: 94.5 %
Patient temperature: 98.6
pCO2 arterial: 38.8 mmHg (ref 32.0–48.0)
pH, Arterial: 7.36 (ref 7.350–7.450)
pO2, Arterial: 75.6 mmHg — ABNORMAL LOW (ref 83.0–108.0)

## 2020-08-15 LAB — GLUCOSE, CAPILLARY
Glucose-Capillary: 119 mg/dL — ABNORMAL HIGH (ref 70–99)
Glucose-Capillary: 126 mg/dL — ABNORMAL HIGH (ref 70–99)

## 2020-08-15 LAB — CBC
HCT: 22.4 % — ABNORMAL LOW (ref 39.0–52.0)
Hemoglobin: 6.8 g/dL — CL (ref 13.0–17.0)
MCH: 29.1 pg (ref 26.0–34.0)
MCHC: 30.4 g/dL (ref 30.0–36.0)
MCV: 95.7 fL (ref 80.0–100.0)
Platelets: 116 10*3/uL — ABNORMAL LOW (ref 150–400)
RBC: 2.34 MIL/uL — ABNORMAL LOW (ref 4.22–5.81)
RDW: 12.2 % (ref 11.5–15.5)
WBC: 7 10*3/uL (ref 4.0–10.5)
nRBC: 0 % (ref 0.0–0.2)

## 2020-08-15 LAB — APTT: aPTT: 40 seconds — ABNORMAL HIGH (ref 24–36)

## 2020-08-15 LAB — LACTIC ACID, PLASMA: Lactic Acid, Venous: 1 mmol/L (ref 0.5–1.9)

## 2020-08-15 LAB — PREPARE RBC (CROSSMATCH)

## 2020-08-15 MED ORDER — SODIUM CHLORIDE 0.9 % IV BOLUS
500.0000 mL | Freq: Once | INTRAVENOUS | Status: AC
  Administered 2020-08-15: 500 mL via INTRAVENOUS

## 2020-08-15 MED ORDER — CEFAZOLIN SODIUM-DEXTROSE 2-4 GM/100ML-% IV SOLN
2.0000 g | Freq: Once | INTRAVENOUS | Status: AC
  Administered 2020-08-15: 2 g via INTRAVENOUS
  Filled 2020-08-15: qty 100

## 2020-08-15 MED ORDER — SODIUM CHLORIDE 0.9 % IV SOLN: INTRAVENOUS | Status: AC

## 2020-08-15 MED ORDER — HYDROCODONE-ACETAMINOPHEN 5-325 MG PO TABS: 1.0000 | ORAL_TABLET | Freq: Four times a day (QID) | ORAL | 0 refills | Status: DC | PRN

## 2020-08-15 MED ORDER — SODIUM CHLORIDE 0.9% IV SOLUTION: Freq: Once | INTRAVENOUS | Status: AC

## 2020-08-15 NOTE — Progress Notes (Signed)
   08/15/20 0905  Vitals  BP (!) 71/54  MAP (mmHg) (!) 61  BP Location Left Arm  BP Method Automatic  Patient Position (if appropriate) Lying  Pulse Rate 64  Pulse Rate Source Monitor  Resp 20  MEWS COLOR  MEWS Score Color Yellow  Oxygen Therapy  SpO2 94 %  O2 Device Nasal Cannula  O2 Flow Rate (L/min) 2 L/min  Pain Assessment  Pain Scale 0-10  Pain Score 0  MEWS Score  MEWS Temp 0  MEWS Systolic 2  MEWS Pulse 0  MEWS RR 0  MEWS LOC 0  MEWS Score 2  Provider Notification  Provider Name/Title Pokhrel, MD  Date Provider Notified 08/15/20  Time Provider Notified [phone]  Notification Type Page  Notification Reason Change in status  Response Other (Comment) (awaiting orders)  Date of Provider Response 08/15/20  Time of Provider Response [phone]

## 2020-08-15 NOTE — Progress Notes (Signed)
Notified Lab that ABG being sent for analysis. 

## 2020-08-15 NOTE — Progress Notes (Signed)
PT Cancellation Note  Patient Details Name: JAYMEN FETCH MRN: [id number] DOB: 03/05/1941   Cancelled Treatment:     PT order received but eval deferred.  Pt with rapid response called this am.  Pt also with Hgb of 6.8 with transfusion ordered.  Will follow in am.  [place] Pager [phone] Office [phone]    [facility] 08/15/2020, 4:03 PM

## 2020-08-15 NOTE — Progress Notes (Signed)
    Subjective:  Patient reports pain as mild to moderate.  Denies N/V/CP/SOB. Patient is resting comfortably in bed  Objective:   VITALS:   Vitals:   08/15/20 0300 08/15/20 0538 08/15/20 0905 08/15/20 0906  BP: 95/60 (!) 113/54 (!) 71/54   Pulse: 70 66 64   Resp: 18 18 20    Temp: 98.8 F (37.1 C) 97.8 F (36.6 C)  98.1 F (36.7 C)  TempSrc:    Oral  SpO2: 96% 94% 94%   Weight:      Height:        NAD ABD soft Neurovascular intact Sensation intact distally Intact pulses distally Dorsiflexion/Plantar flexion intact Incision: dressing C/D/I   Lab Results  Component Value Date   WBC 7.0 08/15/2020   HGB 6.8 (LL) 08/15/2020   HCT 22.4 (L) 08/15/2020   MCV 95.7 08/15/2020   PLT 116 (L) 08/15/2020   BMET    Component Value Date/Time   NA 142 08/13/2020 1051   NA 142 11/15/2019 0946   K 4.8 08/13/2020 1051   CL 105 08/13/2020 1051   CO2 27 08/13/2020 1051   GLUCOSE 72 08/13/2020 1051   BUN 46 (H) 08/13/2020 1051   BUN 32 (H) 11/15/2019 0946   CREATININE 2.36 (H) 08/13/2020 1051   CREATININE 1.21 07/20/2012 1025   CALCIUM 10.9 (H) 08/13/2020 1051   GFRNONAA 27 (L) 08/13/2020 1051   GFRAA 53 (L) 11/15/2019 0946     Assessment/Plan: 1 Day Post-Op   Principal Problem:   Hip fracture ([facility]) Active Problems:   CRD (chronic renal disease), stage III ([facility])   MDD (major depressive disorder), recurrent severe, without psychosis ([facility])   Syncope   AKI (acute kidney injury) ([facility])   LVH (left ventricular hypertrophy)   WBAT with walker DVT ppx: Apixaban, SCDs, TEDS PO pain control PT/OT Dispo: D/C pending clearance of PT/OT.      Dorothyann Peng 08/15/2020, 9:06 AM [facility] is now [employer] [facility] [address] [place], [place], [place] [address] Phone: [phone] [email or web address] Facebook  [employer]

## 2020-08-15 NOTE — Progress Notes (Signed)
OT Cancellation Note  Patient Details Name: Fernando Carr MRN: [id number] DOB: 1940-12-22   Cancelled Treatment:    Reason Eval/Treat Not Completed: Medical issues which prohibited therapy. Patient not hemodynamically stable at this time due to low HGB and BP. Will f/u as able and when patient appropriate for therapy evaluation.  [name] [name] 08/15/2020, 11:43 AM

## 2020-08-15 NOTE — Progress Notes (Signed)
CRITICAL VALUE STICKER  CRITICAL VALUE: HGB 6.8  DATE & TIME NOTIFIED: 08/15/2020 8421  MD NOTIFIED: Louanne Belton, MD  TIME OF NOTIFICATION: 0312  RESPONSE: awaiting response

## 2020-08-15 NOTE — Progress Notes (Addendum)
PROGRESS NOTE  Trayveon Beckford Economou QZR:[id number] DOB: April 11, 1941 DOA: 08/13/2020 PCP: [name], MD   LOS: 2 days   Brief narrative: This is a 80 year old male  with past medical history of Parkinson disease, atrial fibrillation not on Eliquis, CKD 3a, AAA s/p repair 2015, hyperlipidemia, bipolar disorder, syncope, severe LVH who presented from [facility] with concern for syncope versus near syncope event with right hip pain.  Patient states he typically ambulates with a Rollator.  States that he was having breakfast when he was walking away from the table and believes that he had a an episode of loss of consciousness and woke up soon after on the floor.  Does not believe he hit his head.  In the ED, Initial BP 99/57 which improved with IV fluids.  Afebrile and hemodynamically stable on room air. Notable Labs: Sodium 142, K4.8, BUN 46, creatinine 2.36, calcium 10.9, WBC 5.5, Hb 12.2, platelets 155, COVID-19 and flu negative. Notable Imaging: Right hip x-ray- mildly displaced proximal right femoral neck fracture. Patient received 1 L NS bolus and morphine.  Orthopedic surgery was consulted in the ED. Patient was then admitted to the hospital.  Assessment/Plan:  Principal Problem:   Hip fracture ([place]) Active Problems:   CRD (chronic renal disease), stage III ([place])   MDD (major depressive disorder), recurrent severe, without psychosis ([place])   Syncope   AKI (acute kidney injury) ([place])   LVH (left ventricular hypertrophy)  Proximal right femoral neck mildly displaced fracture s/p mechanical fall  Status post right total hip arthoplasty.  Further management as per orthopedics.  Confusion, hypotension Likely acute metabolic encephalopathy.  After he got hydrocodone.  Rapid response was called in.  Patient received 1 L of IV fluid bolus.  Will transfuse 1 unit of PRBC..  We will closely monitor.  ABG within normal limits.  Postoperative anemia likely from acute blood loss.  Hemoglobin of 6.8.   Will transfuse 1 unit of packed RBC. Check in am.  Suspected syncope, concern for orthostatic hypotension  continue IV fluids. Ambulate with physical therapy when feasible  AKI on CKD 3a.  Patient presented with serum Cr 2.36, baseline 1.45 in April.  Continue IV fluids.  Creatinine of 2.06 today.  Patient is required IV boluses today.  Blood transfusion.  Check BMP in AM.  Severe LVH/Atrial fibrillation Does not take Eliquis, though it is on his med list. Follows up with cardiology as outpatient.   Anxiety/depression Resume sertaline from today  History of parkinsonism. On carbidopa/levodopa at home.  DVT prophylaxis: Ortho recommended Eliquis.  Will hold for now due to postoperative anemia.  Code Status:  Full code  Family Communication:  I call the patient's sister Ms Arbie Cookey on the phone and updated her about the clinical condition of the patient.  Status is: Inpatient  Remains inpatient appropriate because:Unsafe d/c plan, IV treatments appropriate due to intensity of illness or inability to take PO, metabolic encephalopathy, status post cervical surgery, awaiting PT OT evaluation likely need for rehabilitation   Dispo: The patient is from: ALF/independent living              Anticipated d/c is to: Likely to skilled nursing facility, pending PT, OT evaluation.              Anticipated d/c date is: 2 days              Patient currently is not medically stable to d/c.  Consultants:  Orthopedics  Procedures:  Status post total  right hip hemiarthroplasty on 08/14/2020  Anti-infectives:  . None  Anti-infectives (From admission, onward)   Start     Dose/Rate Route Frequency Ordered Stop   08/15/20 0500  ceFAZolin (ANCEF) IVPB 2g/100 mL premix        2 g 200 mL/hr over 30 Minutes Intravenous  Once 08/15/20 0446 08/15/20 0528   08/14/20 1830  ceFAZolin (ANCEF) IVPB 2g/100 mL premix        2 g 200 mL/hr over 30 Minutes Intravenous Every 6 hours 08/14/20 1719 08/15/20  0229   08/14/20 1330  ceFAZolin (ANCEF) IVPB 2g/100 mL premix        2 g 200 mL/hr over 30 Minutes Intravenous On call to O.R. 08/14/20 1303 08/14/20 1311   08/14/20 1302  ceFAZolin (ANCEF) 2-4 GM/100ML-% IVPB       Note to Pharmacy: Bridget Hartshorn   : cabinet override      08/14/20 1302 08/14/20 1338     Subjective: Today, patient was seen and examined at bedside.  Rapid response at bedside due to confusion and hypotension.  Patient denies any chest pain, dizziness, lightheadedness mildly sleepy.  Blood glucose levels within normal limits   Objective: Vitals:   08/15/20 1002 08/15/20 1035  BP:  (!) 74/45  Pulse:  65  Resp:  (!) 22  Temp: 98.2 F (36.8 C)   SpO2:  95%    Intake/Output Summary (Last 24 hours) at 08/15/2020 1235 Last data filed at 08/15/2020 0500 Gross per 24 hour  Intake 2817.29 ml  Output 1300 ml  Net 1517.29 ml   Filed Weights   08/13/20 0956 08/14/20 1708  Weight: 102.1 kg 102 kg   Body mass index is 30.5 kg/m.   Physical Exam: GENERAL: Patient was mildly somnolent but awake on verbal command,. Not in obvious distress.  Obese HENT: No scleral pallor or icterus. Pupils equally reactive to light. Oral mucosa is moist NECK: is supple, no gross swelling noted. CHEST: Clear to auscultation. No crackles or wheezes.  Diminished breath sounds bilaterally. CVS: S1 and S2 heard, no murmur.  ABDOMEN: Soft, non-tender, bowel sounds are present. EXTREMITIES: Right hip dressing from recent surgery, right upper extremity tremors CNS: Cranial nerves are intact..  Right upper extremity tremors SKIN: warm and dry without rashes.  Data Review: I have personally reviewed the following laboratory data and studies,  CBC: Recent Labs  Lab 08/13/20 1051 08/15/20 0743  WBC 5.5 7.0  NEUTROABS 4.2  --   HGB 12.2* 6.8*  HCT 39.3 22.4*  MCV 95.4 95.7  PLT 155 [id number]*   Basic Metabolic Panel: Recent Labs  Lab 08/13/20 1051 08/15/20 0743  NA 142 138  K 4.8 4.7  CL  105 108  CO2 27 21*  GLUCOSE 72 115*  BUN 46* 43*  CREATININE 2.36* 2.06*  CALCIUM 10.9* 9.7   Liver Function Tests: No results for input(s): AST, ALT, ALKPHOS, BILITOT, PROT, ALBUMIN in the last 168 hours. No results for input(s): LIPASE, AMYLASE in the last 168 hours. No results for input(s): AMMONIA in the last 168 hours. Cardiac Enzymes: No results for input(s): CKTOTAL, CKMB, CKMBINDEX, TROPONINI in the last 168 hours. BNP (last 3 results) No results for input(s): BNP in the last 8760 hours.  ProBNP (last 3 results) No results for input(s): PROBNP in the last 8760 hours.  CBG: Recent Labs  Lab 08/14/20 0517 08/15/20 0535 08/15/20 0950  GLUCAP 122* 119* 126*   Recent Results (from the past 240 hour(s))  Resp Panel  by RT-PCR (Flu A&B, Covid) Nasopharyngeal Swab     Status: None   Collection Time: 08/13/20 11:58 AM   Specimen: Nasopharyngeal Swab; Nasopharyngeal(NP) swabs in vial transport medium  Result Value Ref Range Status   SARS Coronavirus 2 by RT PCR NEGATIVE NEGATIVE Final    Comment: (NOTE) SARS-CoV-2 target nucleic acids are NOT DETECTED.  The SARS-CoV-2 RNA is generally detectable in upper respiratory specimens during the acute phase of infection. The lowest concentration of SARS-CoV-2 viral copies this assay can detect is 138 copies/mL. A negative result does not preclude SARS-Cov-2 infection and should not be used as the sole basis for treatment or other patient management decisions. A negative result may occur with  improper specimen collection/handling, submission of specimen other than nasopharyngeal swab, presence of viral mutation(s) within the areas targeted by this assay, and inadequate number of viral copies(<138 copies/mL). A negative result must be combined with clinical observations, patient history, and epidemiological information. The expected result is Negative.  Fact Sheet for Patients:  EntrepreneurPulse.com.au  Fact  Sheet for Healthcare Providers:  IncredibleEmployment.be  This test is no t yet approved or cleared by the [place] FDA and  has been authorized for detection and/or diagnosis of SARS-CoV-2 by FDA under an Emergency Use Authorization (EUA). This EUA will remain  in effect (meaning this test can be used) for the duration of the COVID-19 declaration under Section 564(b)(1) of the Act, 21 U.S.C.section 360bbb-3(b)(1), unless the authorization is terminated  or revoked sooner.       Influenza A by PCR NEGATIVE NEGATIVE Final   Influenza B by PCR NEGATIVE NEGATIVE Final    Comment: (NOTE) The Xpert Xpress SARS-CoV-2/FLU/RSV plus assay is intended as an aid in the diagnosis of influenza from Nasopharyngeal swab specimens and should not be used as a sole basis for treatment. Nasal washings and aspirates are unacceptable for Xpert Xpress SARS-CoV-2/FLU/RSV testing.  Fact Sheet for Patients: EntrepreneurPulse.com.au  Fact Sheet for Healthcare Providers: IncredibleEmployment.be  This test is not yet approved or cleared by the [place] FDA and has been authorized for detection and/or diagnosis of SARS-CoV-2 by FDA under an Emergency Use Authorization (EUA). This EUA will remain in effect (meaning this test can be used) for the duration of the COVID-19 declaration under Section 564(b)(1) of the Act, 21 U.S.C. section 360bbb-3(b)(1), unless the authorization is terminated or revoked.  Performed at [facility], [place] [address], [place], [place] [address]      Studies: Pelvis Portable  Result Date: 08/14/2020 CLINICAL DATA:  Right total arthroplasty EXAM: PORTABLE PELVIS 1-2 VIEWS COMPARISON:  08/13/2020 right hip radiographs FINDINGS: Status post right total hip arthroplasty with well-positioned right acetabular and right proximal femoral prostheses. Skin staples lateral to the right hip. No hip  dislocation. No osseous fracture. No focal osseous lesion. Degenerative changes in the visualized lower lumbar spine. Expected postsurgical soft tissue gas surrounding the right hip. IMPRESSION: Satisfactory immediate postoperative appearance status post right total hip arthroplasty. Electronically Signed   By: Ilona Sorrel M.D.   On: 08/14/2020 16:07   DG C-Arm 1-60 Min-No Report  Result Date: 08/14/2020 Fluoroscopy was utilized by the requesting physician.  No radiographic interpretation.   DG HIP OPERATIVE UNILAT W OR W/O PELVIS RIGHT  Result Date: 08/14/2020 CLINICAL DATA:  Right total hip arthroplasty. EXAM: OPERATIVE RIGHT HIP (WITH PELVIS IF PERFORMED) 2 VIEWS TECHNIQUE: Fluoroscopic spot image(s) were submitted for interpretation post-operatively. COMPARISON:  August 13, 2020. FINDINGS: Fluoro time: 13.3 seconds.  Reported radiation: 2.1442 mGy. Two C-arm fluoroscopic images were obtained intraoperatively and submitted for post operative interpretation. These images demonstrate postsurgical changes of right total hip arthroplasty. No unexpected findings. Please see the performing provider's procedural report for further detail. IMPRESSION: Intraoperative fluoroscopy, as detailed above. Electronically Signed   By: Margaretha Sheffield MD   On: 08/14/2020 14:50      Flora Lipps, MD  [facility] 08/15/2020  If 7PM-7AM, please contact night-coverage

## 2020-08-15 NOTE — Plan of Care (Signed)

## 2020-08-16 DIAGNOSIS — N179 Acute kidney failure, unspecified: Secondary | ICD-10-CM | POA: Diagnosis not present

## 2020-08-16 DIAGNOSIS — N1831 Chronic kidney disease, stage 3a: Secondary | ICD-10-CM | POA: Diagnosis not present

## 2020-08-16 DIAGNOSIS — S72001A Fracture of unspecified part of neck of right femur, initial encounter for closed fracture: Secondary | ICD-10-CM | POA: Diagnosis not present

## 2020-08-16 DIAGNOSIS — I517 Cardiomegaly: Secondary | ICD-10-CM | POA: Diagnosis not present

## 2020-08-16 LAB — CBC
HCT: 22.6 % — ABNORMAL LOW (ref 39.0–52.0)
Hemoglobin: 7.4 g/dL — ABNORMAL LOW (ref 13.0–17.0)
MCH: 30.1 pg (ref 26.0–34.0)
MCHC: 32.7 g/dL (ref 30.0–36.0)
MCV: 91.9 fL (ref 80.0–100.0)
Platelets: 106 10*3/uL — ABNORMAL LOW (ref 150–400)
RBC: 2.46 MIL/uL — ABNORMAL LOW (ref 4.22–5.81)
RDW: 12.8 % (ref 11.5–15.5)
WBC: 6 10*3/uL (ref 4.0–10.5)
nRBC: 0 % (ref 0.0–0.2)

## 2020-08-16 LAB — BASIC METABOLIC PANEL
Anion gap: 12 (ref 5–15)
BUN: 40 mg/dL — ABNORMAL HIGH (ref 8–23)
CO2: 16 mmol/L — ABNORMAL LOW (ref 22–32)
Calcium: 9.6 mg/dL (ref 8.9–10.3)
Chloride: 110 mmol/L (ref 98–111)
Creatinine, Ser: 1.62 mg/dL — ABNORMAL HIGH (ref 0.61–1.24)
GFR, Estimated: 43 mL/min — ABNORMAL LOW (ref >60)
Glucose, Bld: 90 mg/dL (ref 70–99)
Potassium: 4.9 mmol/L (ref 3.5–5.1)
Sodium: 138 mmol/L (ref 135–145)

## 2020-08-16 LAB — GLUCOSE, CAPILLARY
Glucose-Capillary: 90 mg/dL (ref 70–99)
Glucose-Capillary: 104 mg/dL — ABNORMAL HIGH (ref 70–99)

## 2020-08-16 LAB — PHOSPHORUS: Phosphorus: 1.9 mg/dL — ABNORMAL LOW (ref 2.5–4.6)

## 2020-08-16 LAB — MAGNESIUM: Magnesium: 1.8 mg/dL (ref 1.7–2.4)

## 2020-08-16 MED ORDER — APIXABAN 2.5 MG PO TABS
2.5000 mg | ORAL_TABLET | Freq: Two times a day (BID) | ORAL | Status: DC
  Administered 2020-08-16 – 2020-08-19 (×8): 2.5 mg via ORAL
  Filled 2020-08-16 (×8): qty 1

## 2020-08-16 NOTE — NC FL2 (Addendum)
[place] LEVEL OF CARE SCREENING TOOL     IDENTIFICATION  Patient Name: Fernando Carr Birthdate: [birth year] Sex: male Admission Date (Current Location): 08/13/2020  [facility] and [place] Number:  Herbalist and Address:  [facility],  [place] [address] [place]      Provider Number: [id number]  Attending Physician Name and Address:  Flora Lipps, MD  Relative Name and Phone Number:  Duwayne, Matters Daughter     [phone]    Current Level of Care: Hospital Recommended Level of Care: [place] Prior Approval Number:    Date Approved/Denied:   PASRR Number: Pending  Discharge Plan: SNF    Current Diagnoses: Patient Active Problem List   Diagnosis Date Noted  . Hip fracture ([place]) 08/13/2020  . LVH (left ventricular hypertrophy) 11/14/2019  . Educated about COVID-19 virus infection 11/14/2019  . AKI (acute kidney injury) ([place])   . Syncope 09/26/2019  . MDD (major depressive disorder), recurrent severe, without psychosis ([place]) 08/20/2014  . 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  . Severe depression ([place])   . Major depressive disorder, recurrent, severe without psychotic features ([place])   . AAA (abdominal aortic aneurysm) ([place]) 07/07/2014  . Dehydration 03/10/2014  . FTT (failure to thrive) in adult 03/10/2014  . 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 Sinus bradycardia 03/10/2014  . HTN (hypertension) 03/10/2014  . Hypotension, postural 03/09/2014  . CRD (chronic renal disease), stage III ([place]) 01/09/2014  . Abdominal aortic aneurysm ([place]) 12/20/2011  . Bipolar 1 disorder ([place]) 10/24/2011  . Hyperlipemia 10/10/2011  . Hypertriglyceridemia 10/03/2011  . Hyperlipidemia 03/24/2011    Orientation RESPIRATION BLADDER Height & Weight     Self,Time,Situation,Place  Normal Continent Weight: 224 lb 13.9 oz (102 kg) Height:  6' (182.9 cm)  BEHAVIORAL SYMPTOMS/MOOD NEUROLOGICAL BOWEL NUTRITION STATUS       Continent Diet  AMBULATORY STATUS COMMUNICATION OF NEEDS Skin   Limited Assist Verbally Normal                       Personal Care Assistance Level of Assistance  Bathing,Feeding,Dressing,Total care Bathing Assistance: Independent Feeding assistance: Independent Dressing Assistance: Independent Total Care Assistance: Limited assistance   Functional Limitations Info  Sight,Hearing,Speech Sight Info: Adequate Hearing Info: Adequate Speech Info: Adequate    SPECIAL CARE FACTORS FREQUENCY     PT 5X per week  OT 5X per week                Contractures Contractures Info: Not present    Additional Factors Info                  Current Medications (08/16/2020):  This is the current hospital active medication list Current Facility-Administered Medications  Medication Dose Route Frequency Provider Last Rate Last Admin  . apixaban (ELIQUIS) tablet 2.5 mg  2.5 mg Oral BID Pokhrel, Laxman, MD   2.5 mg at 08/16/20 1313  . carbidopa-levodopa (SINEMET IR) 25-100 MG per tablet immediate release 1 tablet  1 tablet Oral TID Orson Aloe, MD   1 tablet at 08/16/20 1313  . Chlorhexidine Gluconate Cloth 2 % PADS 6 each  6 each Topical Daily Pokhrel, Laxman, MD   6 each at 08/16/20 1024  . docusate sodium (COLACE) capsule 100 mg  100 mg Oral BID Rod Can, MD   100 mg at 08/16/20 0913  . HYDROcodone-acetaminophen (NORCO/VICODIN) 5-325 MG per tablet 1-2 tablet  1-2 tablet Oral Q6H PRN  Rod Can, MD   1 tablet at 08/15/20 [phone]  . MEDLINE mouth rinse  15 mL Mouth Rinse BID Rod Can, MD   15 mL at 08/15/20 2133  . menthol-cetylpyridinium (CEPACOL) lozenge 3 mg  1 lozenge Oral PRN Swinteck, Aaron Edelman, MD       Or  . phenol (CHLORASEPTIC) mouth spray 1 spray  1 spray Mouth/Throat PRN Swinteck, Aaron Edelman, MD      . metoCLOPramide (REGLAN) tablet 5-10 mg  5-10 mg Oral Q8H PRN Swinteck, Aaron Edelman, MD       Or  . metoCLOPramide (REGLAN) injection 5-10 mg  5-10 mg Intravenous Q8H  PRN Swinteck, Aaron Edelman, MD      . morphine 2 MG/ML injection 0.5 mg  0.5 mg Intravenous Q2H PRN Rod Can, MD   0.5 mg at 08/14/20 0552  . ondansetron (ZOFRAN) tablet 4 mg  4 mg Oral Q6H PRN Swinteck, Aaron Edelman, MD       Or  . ondansetron [facility]) injection 4 mg  4 mg Intravenous Q6H PRN Swinteck, Aaron Edelman, MD      . risperiDONE (RISPERDAL) tablet 0.5 mg  0.5 mg Oral QHS Rod Can, MD   0.5 mg at 08/15/20 2132  . senna-docusate (Senokot-S) tablet 1 tablet  1 tablet Oral QHS PRN Swinteck, Aaron Edelman, MD      . sertraline (ZOLOFT) tablet 50 mg  50 mg Oral Daily Rod Can, MD   50 mg at 08/16/20 0962     Discharge Medications: Please see discharge summary for a list of discharge medications.  Relevant Imaging Results:  Relevant Lab Results:   Additional Information SS# [id number]  Adelene Amas, LCSWA

## 2020-08-16 NOTE — Progress Notes (Addendum)
Subjective: 2 Days Post-Op Procedure(s) (LRB): TOTAL HIP ARTHROPLASTY ANTERIOR APPROACH (Right) Patient reports pain as 3 on 0-10 scale.   Denies CP or SOB.  Voiding without difficulty. Positive flatus. Alert. Appropriate Objective: Vital signs in last 24 hours: Temp:  [97.5 F (36.4 C)-98.2 F (36.8 C)] 98.1 F (36.7 C) (01/15 1802) Pulse Rate:  [61-111] 85 (01/15 1802) Resp:  [11-22] 16 (01/15 1802) BP: (71-132)/(45-99) 132/62 (01/15 1802) SpO2:  [92 %-100 %] 96 % (01/15 1802)  Intake/Output from previous day: 01/15 0701 - 01/16 0700 In: 2737.7 [P.O.:960; I.V.:408.7; Blood:370; IV Piggyback:999] Out: 154 [Urine:650] Intake/Output this shift: No intake/output data recorded.  Recent Labs    08/13/20 1051 08/15/20 0743 08/15/20 1909 08/16/20 0530  HGB 12.2* 6.8* 7.6* 7.4*   Recent Labs    08/15/20 1909 08/16/20 0530  WBC 6.4 6.0  RBC 2.54* 2.46*  HCT 24.0* 22.6*  PLT 127* 106*   Recent Labs    08/15/20 0959 08/16/20 0530  NA 137 138  K 4.6 4.9  CL 108 110  CO2 21* 16*  BUN 41* 40*  CREATININE 2.24* 1.62*  GLUCOSE 121* 90  CALCIUM 9.8 9.6   Recent Labs    08/15/20 1909  INR 1.2    Neurologically intact Sensation intact distally Dorsiflexion/Plantar flexion intact Incision: dressing C/D/I  Assessment/Plan: 2 Days Post-Op Procedure(s) (LRB): TOTAL HIP ARTHROPLASTY ANTERIOR APPROACH (Right)  Episode of confusion and hypotention yesterday. Alert and appropriate today. Cr better, Hgb 7.4  Advance diet  OOB. WBAT Watch H/H May need another unit   Fernando Carr 08/16/2020, 8:23 AM

## 2020-08-16 NOTE — Plan of Care (Signed)
  Problem: Education: Goal: Knowledge of the prescribed therapeutic regimen will improve Outcome: Progressing Goal: Understanding of discharge needs will improve Outcome: Progressing Goal: Individualized Educational Video(s) Outcome: Not Applicable   Problem: Activity: Goal: Ability to avoid complications of mobility impairment will improve Outcome: Progressing Goal: Ability to tolerate increased activity will improve Outcome: Not Progressing   Problem: Pain Management: Goal: Pain level will decrease with appropriate interventions Outcome: Progressing   Problem: Skin Integrity: Goal: Will show signs of wound healing Outcome: Progressing   Problem: Education: Goal: Knowledge of General Education information will improve Description: Including pain rating scale, medication(s)/side effects and non-pharmacologic comfort measures Outcome: Progressing   Problem: Health Behavior/Discharge Planning: Goal: Ability to manage health-related needs will improve Outcome: Progressing   Problem: Clinical Measurements: Goal: Ability to maintain clinical measurements within normal limits will improve Outcome: Progressing Goal: Will remain free from infection Outcome: Progressing Goal: Diagnostic test results will improve Outcome: Progressing Goal: Respiratory complications will improve Outcome: Progressing Goal: Cardiovascular complication will be avoided Outcome: Progressing   Problem: Activity: Goal: Risk for activity intolerance will decrease Outcome: Not Progressing   Problem: Nutrition: Goal: Adequate nutrition will be maintained Outcome: Completed/Met   Problem: Coping: Goal: Level of anxiety will decrease Outcome: Progressing   Problem: Elimination: Goal: Will not experience complications related to bowel motility Outcome: Progressing Goal: Will not experience complications related to urinary retention Outcome: Progressing   Problem: Pain Managment: Goal: General  experience of comfort will improve Outcome: Progressing   Problem: Safety: Goal: Ability to remain free from injury will improve Outcome: Progressing   Problem: Skin Integrity: Goal: Risk for impaired skin integrity will decrease Outcome: Progressing

## 2020-08-16 NOTE — TOC Initial Note (Addendum)
Transition of Care [facility]) - Initial/Assessment Note    Patient Details  Name: Fernando Carr MRN: [id number] Date of Birth: 13-Sep-1940  [facility]) CM/SW Contact:    Ova Freshwater Phone Number: [phone] 08/16/2020, 1:45 PM  Clinical Narrative:                  Patient presents to [facility] ED after fall.  Patient currently lives at [facility] independent living.  CSW spoke wit patient and explained role of TOC in patient care.  CSW updated patient on PT/OT recommendations for SNF placement.  Patient stated he lived in [place] and he would like to return there.  CSW stated the SNF was for rehab and he would be able to return to [facility] once he completed rehab.  Patient verbalized understanding. Patient requested CSW speak with [place] advocate at [place] [phone]. CSW spoke with Ms. Harris about PT/OT recommendations.  Ms. Kenton Kingfisher stated the patient would not be allowed to return to [facility] until he completed rehab. CSW and Ms. Kenton Kingfisher spoke about SNF placement and  Ms. Harris requested that CSW "look for placement in all surrounding counties."    PASRR pending H&P, Fl2 and progress notes sent via Epic to [phone], FL2 pending signature  Expected Discharge Plan: Skilled Nursing Facility Barriers to Discharge: Awaiting State Approval (PASRR),SNF Pending bed offer,SNF Pending discharge orders,Insurance Authorization,Continued Medical Work up   Patient Goals and CMS Choice   CMS Medicare.gov Compare Post Acute Care list provided to:: Patient Choice offered to / list presented to : Patient  Expected Discharge Plan and Services Expected Discharge Plan: [place] In-house Referral: Clinical Social Work   Post Acute Care Choice: [place] Living arrangements for the past 2 months: [place] ([place])                                       Prior Living Arrangements/Services Living arrangements for the past 2 months: [place] ([place]) Lives with:: Self Patient language and need for interpreter reviewed:: Yes Do you feel safe going back to the place where you live?: Yes      Need for Family Participation in Patient Care: No (Comment) Care giver support system in place?: Yes (comment)   Criminal Activity/Legal Involvement Pertinent to Current Situation/Hospitalization: No - Comment as needed  Activities of Daily Living Home Assistive Devices/Equipment: Eyeglasses,Grab bars around toilet,Grab bars in shower,Hand-held shower hose,Scales,Walker (specify type) (4 wheeled walker) ADL Screening (condition at time of admission) Patient's cognitive ability adequate to safely complete daily activities?: Yes Is the patient deaf or have difficulty hearing?: No Does the patient have difficulty seeing, even when wearing glasses/contacts?: No Does the patient have difficulty concentrating, remembering, or making decisions?: No Patient able to express need for assistance with ADLs?: Yes Does the patient have difficulty dressing or bathing?: Yes Independently performs ADLs?: No Communication: Independent Dressing (OT): Needs assistance Is this a change from baseline?: Pre-admission baseline Grooming: Needs assistance Is this a change from baseline?: Pre-admission baseline Feeding: Needs assistance Is this a change from baseline?: Pre-admission baseline Bathing: Needs assistance Is this a change from baseline?: Pre-admission baseline Toileting: Dependent Is this a change from baseline?: Change from baseline, expected to last >3days In/Out Bed: Dependent Is this a change from baseline?: Change from baseline, expected  to last >3 days Walks in Home: Dependent Is this a change from baseline?: Change from baseline, expected to last >3 days Does the patient have difficulty walking or climbing  stairs?: Yes (secondary to weakness and fracture hip) Weakness of Legs: Right Weakness of Arms/Hands: None  Permission Sought/Granted Permission sought to share information with : Family Supports Mats, Jeanlouis Daughter     [phone]) Permission granted to share information with : Yes, Verbal Permission Granted  Share Information with NAME: Hammad, Finkler Daughter     [phone]           Emotional Assessment Appearance:: Appears stated age Attitude/Demeanor/Rapport: Engaged Affect (typically observed): Calm Orientation: : Oriented to Self,Oriented to Place,Oriented to  Time,Oriented to Situation Alcohol / Substance Use: Not Applicable Psych Involvement: No (comment)  Admission diagnosis:  Hip fracture ([place]) [S72.009A] Closed fracture of right hip, initial encounter [facility]) [S72.001A] Patient Active Problem List   Diagnosis Date Noted  . Hip fracture ([place]) 08/13/2020  . LVH (left ventricular hypertrophy) 11/14/2019  . Educated about COVID-19 virus infection 11/14/2019  . AKI (acute kidney injury) ([place])   . Syncope 09/26/2019  . MDD (major depressive disorder), recurrent severe, without psychosis ([place]) 08/20/2014  . 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  . Severe depression ([place])   . Major depressive disorder, recurrent, severe without psychotic features ([place])   . AAA (abdominal aortic aneurysm) ([place]) 07/07/2014  . Dehydration 03/10/2014  . FTT (failure to thrive) in adult 03/10/2014  . 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 Sinus bradycardia 03/10/2014  . HTN (hypertension) 03/10/2014  . Hypotension, postural 03/09/2014  . CRD (chronic renal disease), stage III ([place]) 01/09/2014  . Abdominal aortic aneurysm ([place]) 12/20/2011  . Bipolar 1 disorder ([place]) 10/24/2011  . Hyperlipemia 10/10/2011  . Hypertriglyceridemia 10/03/2011  . Hyperlipidemia 03/24/2011   PCP:  [name], MD Pharmacy:   [id number] [place] [place] [address] [place] [address] Phone: [phone] Fax: [phone]  [facility] [place] [name] [facility] [employer] [facility] [place] [address] Phone: [phone] Fax: [phone]     Social Determinants of Health (SDOH) Interventions    Readmission Risk Interventions No flowsheet data found.

## 2020-08-16 NOTE — Evaluation (Signed)
Occupational Therapy Evaluation Patient Details Name: Fernando Carr MRN: [id number] DOB: April 21, 1941 Today's Date: 08/16/2020    History of Present Illness Pt admitted from [facility] s/p fall with R hip fx and now s/p R THR by anterior direct approach.  Pt with hx of CKD, Parkinsons, bipolar, AAA, and A-fib   Clinical Impression   Fernando Carr is a 80 year old man who presents s/p R THR with decreased ROM and strength of RLE, complaints of pain, decreased activity tolerance and impaired balance. Patient required mod assist x 2 for bed mobility and min assist of 2 for standing and taking steps to head of bed. Patient orthostatic with standing which limited mobility and activity. Patient's BP 124/68 and dropped to 113/65. Unable to obtain BP in standing due to patient needing to bear weight through RUE and BP not accurate. On return to sitting patient's BP reading incorrectly and patient became symptomatic and began to slump to the right and less responsive. BP found to be 90/60. Patient returned to bed for safety and Rn notified. Patient will benefit from skilled OT services while in hospital to improve deficits and learn compensatory strategies as needed in order to return to PLOF.      Follow Up Recommendations  SNF    Equipment Recommendations  None recommended by OT    Recommendations for Other Services       Precautions / Restrictions Precautions Precautions: Fall Precaution Comments: Orthostatic Hypotension. Restrictions Weight Bearing Restrictions: No RLE Weight Bearing: Weight bearing as tolerated      Mobility Bed Mobility Overal bed mobility: Needs Assistance Bed Mobility: Supine to Sit;Sit to Supine     Supine to sit: Mod assist;+2 for physical assistance Sit to supine: Mod assist;+2 for physical assistance   General bed mobility comments: Increased time with cues for sequence and use of L LE to self assist    Transfers Overall transfer level: Needs  assistance Equipment used: Rolling walker (2 wheeled) Transfers: Sit to/from Stand Sit to Stand: Min assist;+2 physical assistance;+2 safety/equipment;From elevated surface         General transfer comment: cues for LE management and use of UEs to self assist    Balance Overall balance assessment: Needs assistance Sitting-balance support: Feet supported;No upper extremity supported Sitting balance-Leahy Scale: Fair     Standing balance support: Bilateral upper extremity supported Standing balance-Leahy Scale: Poor                             ADL either performed or assessed with clinical judgement   ADL Overall ADL's : Needs assistance/impaired Eating/Feeding: Independent   Grooming: Set up;Sitting;Bed level   Upper Body Bathing: Set up;Sitting;Bed level   Lower Body Bathing: Maximal assistance;Sitting/lateral leans   Upper Body Dressing : Set up;Sitting   Lower Body Dressing: Total assistance;+2 for physical assistance;+2 for safety/equipment   Toilet Transfer: +2 for physical assistance;+2 for safety/equipment;Minimal assistance;BSC;Stand-pivot;RW   Toileting- Clothing Manipulation and Hygiene: Total assistance;Sit to/from stand;+2 for safety/equipment;+2 for physical assistance               Vision Baseline Vision/History: Wears glasses Wears Glasses: Reading only Patient Visual Report: No change from baseline       Perception     Praxis      Pertinent Vitals/Pain Pain Assessment: Faces Faces Pain Scale: Hurts a little bit Pain Location: R hip Pain Descriptors / Indicators: Aching;Sore Pain Intervention(s): Limited activity within patient's  tolerance     Hand Dominance Right   Extremity/Trunk Assessment Upper Extremity Assessment Upper Extremity Assessment: Overall WFL for tasks assessed (WFL ROM and 5/5 strength.)   Lower Extremity Assessment Lower Extremity Assessment: Defer to PT evaluation   Cervical / Trunk  Assessment Cervical / Trunk Assessment: Kyphotic   Communication Communication Communication: No difficulties   Cognition Arousal/Alertness: Awake/alert Behavior During Therapy: WFL for tasks assessed/performed Overall Cognitive Status: Within Functional Limits for tasks assessed                                     General Comments       Exercises     Shoulder Instructions      Home Living Family/patient expects to be discharged to:: Skilled nursing facility                                 Additional Comments: patient reports living on main level and sleeping in recliner, 1/2 bath on 1st level, which means pt only performs sponge baths       Prior Functioning/Environment Level of Independence: Independent with assistive device(s)        Comments: Uses a rollator. Independent with ADLs. Has a walk in shower with shower chair. Grab bars in shower. [facility] Living at [facility].        OT Problem List: Decreased range of motion;Decreased strength;Decreased activity tolerance;Impaired balance (sitting and/or standing);Decreased safety awareness;Decreased knowledge of use of DME or AE;Obesity;Pain      OT Treatment/Interventions: Self-care/ADL training;Therapeutic exercise;DME and/or AE instruction;Therapeutic activities;Balance training;Patient/family education    OT Goals(Current goals can be found in the care plan section) Acute Rehab OT Goals Patient Stated Goal: Regain IND, [facility] to [facility] OT Goal Formulation: With patient Time For Goal Achievement: 08/30/20 Potential to Achieve Goals: Good  OT Frequency: Min 2X/week   Barriers to D/C:            Co-evaluation PT/OT/SLP Co-Evaluation/Treatment: Yes Reason for Co-Treatment: For patient/therapist safety PT goals addressed during session: Mobility/safety with mobility OT goals addressed during session: ADL's and self-care      AM-PAC OT "6 Clicks" Daily Activity      Outcome Measure Help from another person eating meals?: None Help from another person taking care of personal grooming?: A Little Help from another person toileting, which includes using toliet, bedpan, or urinal?: Total Help from another person bathing (including washing, rinsing, drying)?: A Lot Help from another person to put on and taking off regular upper body clothing?: A Little Help from another person to put on and taking off regular lower body clothing?: Total 6 Click Score: 14   End of Session Equipment Utilized During Treatment: Rolling walker  Activity Tolerance: Other (comment) (Limited by orthostatic hypotension) Patient left:    OT Visit Diagnosis: Unsteadiness on feet (R26.81);Other abnormalities of gait and mobility (R26.89);History of falling (Z91.81);Other symptoms and signs involving the nervous system (R29.898);Dizziness and giddiness (R42);Pain Pain - Right/Left: Right Pain - part of body: Hip                Time: 3267-1245 OT Time Calculation (min): 33 min Charges:  OT General Charges $OT Visit: 1 Visit OT Evaluation $OT Eval Moderate Complexity: 1 Mod  Cj Beecher, OTR/L [place]  Office [phone] Pager: [phone] 08/16/2020, 12:00  PM

## 2020-08-16 NOTE — Evaluation (Signed)
Physical Therapy Evaluation Patient Details Name: Fernando Carr MRN: [id number] DOB: [birth year] Today's Date: 08/16/2020   History of Present Illness  Pt admitted from [facility] s/p fall with R hip fx and now s/p R THR by anterior direct approach.  Pt with hx of CKD, Parkinsons, bipolar, AAA, and A-fib  Clinical Impression  Pt admitted as above and presenting with functional mobility limitations 2* decreased R LE strength/ROM, post op pain, balance deficits and orthostatic BP with OOB activity.  Pt would benefit from follow up rehab at SNF level to maximize IND and safety prior to return to IND living facility.    Follow Up Recommendations SNF    Equipment Recommendations  Rolling walker with 5" wheels    Recommendations for Other Services       Precautions / Restrictions Restrictions Weight Bearing Restrictions: No RLE Weight Bearing: Weight bearing as tolerated      Mobility  Bed Mobility Overal bed mobility: Needs Assistance Bed Mobility: Supine to Sit;Sit to Supine     Supine to sit: Mod assist;+2 for physical assistance Sit to supine: Mod assist;+2 for physical assistance   General bed mobility comments: Increased time with cues for sequence and use of L LE to self assist    Transfers Overall transfer level: Needs assistance Equipment used: Rolling walker (2 wheeled) Transfers: Sit to/from Stand Sit to Stand: Min assist;+2 physical assistance;+2 safety/equipment;From elevated surface         General transfer comment: cues for LE management and use of UEs to self assist  Ambulation/Gait Ambulation/Gait assistance: Min assist;+2 physical assistance;+2 safety/equipment Gait Distance (Feet): 3 Feet Assistive device: Rolling walker (2 wheeled) Gait Pattern/deviations: Step-to pattern;Decreased step length - right;Decreased step length - left;Shuffle;Trunk flexed Gait velocity: decr   General Gait Details: Pt limited to side-step up side of bed 2* falling  BP  Stairs            Wheelchair Mobility    Modified Rankin (Stroke Patients Only)       Balance Overall balance assessment: Needs assistance Sitting-balance support: Feet supported;No upper extremity supported Sitting balance-Leahy Scale: Fair     Standing balance support: Bilateral upper extremity supported Standing balance-Leahy Scale: Poor                               Pertinent Vitals/Pain Pain Assessment: Faces Faces Pain Scale: Hurts a little bit Pain Location: R hip Pain Descriptors / Indicators: Aching;Sore Pain Intervention(s): Limited activity within patient's tolerance;Monitored during session;Premedicated before session    Home Living Family/patient expects to be discharged to:: Skilled nursing facility                      Prior Function Level of Independence: Independent with assistive device(s)         Comments: Uses a rollator. Independent with ADLs. Has a walk in shower with shower chair. Grab bars in shower. Independent Living at [facility].     Hand Dominance   Dominant Hand: Right    Extremity/Trunk Assessment   Upper Extremity Assessment Upper Extremity Assessment: Overall WFL for tasks assessed    Lower Extremity Assessment Lower Extremity Assessment: RLE deficits/detail    Cervical / Trunk Assessment Cervical / Trunk Assessment: Kyphotic  Communication   Communication: No difficulties  Cognition Arousal/Alertness: Awake/alert Behavior During Therapy: WFL for tasks assessed/performed Overall Cognitive Status: Within Functional Limits for tasks assessed  General Comments      Exercises     Assessment/Plan    PT Assessment Patient needs continued PT services  PT Problem List Decreased strength;Decreased range of motion;Decreased activity tolerance;Decreased balance;Decreased mobility;Decreased knowledge of use of DME;Obesity;Pain        PT Treatment Interventions DME instruction;Gait training;Stair training;Functional mobility training;Therapeutic activities;Therapeutic exercise;Patient/family education    PT Goals (Current goals can be found in the Care Plan section)  Acute Rehab PT Goals Patient Stated Goal: Regain IND PT Goal Formulation: With patient Time For Goal Achievement: 08/30/20 Potential to Achieve Goals: Good    Frequency Min 3X/week   Barriers to discharge        Co-evaluation PT/OT/SLP Co-Evaluation/Treatment: Yes Reason for Co-Treatment: For patient/therapist safety PT goals addressed during session: Mobility/safety with mobility OT goals addressed during session: ADL's and self-care       AM-PAC PT "6 Clicks" Mobility  Outcome Measure Help needed turning from your back to your side while in a flat bed without using bedrails?: A Lot Help needed moving from lying on your back to sitting on the side of a flat bed without using bedrails?: A Lot Help needed moving to and from a bed to a chair (including a wheelchair)?: A Little Help needed standing up from a chair using your arms (e.g., wheelchair or bedside chair)?: A Little Help needed to walk in hospital room?: A Little Help needed climbing 3-5 steps with a railing? : A Lot 6 Click Score: 15    End of Session Equipment Utilized During Treatment: Gait belt Activity Tolerance: Patient tolerated treatment well Patient left: in bed;with bed alarm set;with call bell/phone within reach Nurse Communication: Mobility status PT Visit Diagnosis: Difficulty in walking, not elsewhere classified (R26.2)    Time: 4665-9935 PT Time Calculation (min) (ACUTE ONLY): 32 min   Charges:   PT Evaluation $PT Eval Low Complexity: [place] Pager [phone] Office [phone]   Fernando Carr 08/16/2020, 10:12 AM

## 2020-08-16 NOTE — Progress Notes (Signed)
PROGRESS NOTE  Fernando Carr MGQ:[id number] DOB: 1941-08-01 DOA: 08/13/2020 PCP: [name], MD   LOS: 3 days   Brief narrative:  This is a 80 year old male  with past medical history of Parkinson disease, atrial fibrillation not on Eliquis, CKD 3a, AAA s/p repair 2015, hyperlipidemia, bipolar disorder, syncope, severe LVH who presented from [facility] with concern for syncope versus near syncope event with right hip pain.  Patient states he typically ambulates with a Rollator.  States that he was having breakfast when he was walking away from the table and believes that he had a an episode of loss of consciousness and woke up soon after on the floor.  Does not believe he hit his head.  In the ED, Initial BP 99/57 which improved with IV fluids.  Afebrile and hemodynamically stable on room air. Notable Labs: Sodium 142, K4.8, BUN 46, creatinine 2.36, calcium 10.9, WBC 5.5, Hb 12.2, platelets 155, COVID-19 and flu negative. Notable Imaging: Right hip x-ray- mildly displaced proximal right femoral neck fracture. Patient received 1 L NS bolus and morphine.  Orthopedic surgery was consulted and the patient was then admitted to the hospital.  Assessment/Plan:  Principal Problem:   Hip fracture ([place]) Active Problems:   CRD (chronic renal disease), stage III ([place])   MDD (major depressive disorder), recurrent severe, without psychosis ([place])   Syncope   AKI (acute kidney injury) ([place])   LVH (left ventricular hypertrophy)  Proximal right femoral neck mildly displaced fracture s/p mechanical fall  Status post right total hip arthoplasty on 08/14/2020 by orthopedics.  Physical therapy pending at this time.  Spoke with Dr. Tonita Cong orthopedic surgery.  Confusion, hypotension likely acute metabolic encephalopathy.    Patient received PRBC and IV fluids yesterday.  Has improved today  Postoperative anemia likely from acute blood loss.  Improved with blood transfusion.  No obvious hematoma or bruise in  the right hip.  Latest hemoglobin of 7.4.  We will continue to monitor closely  Suspected syncope, concern for orthostatic hypotension . Continue IV fluids. Ambulate with physical therapy, pending physical therapy evaluation.  AKI on CKD 3a.  Patient presented with serum Cr 2.36, baseline 1.45 in April.  Creatinine of 1.6 today.  Closely monitor.  Severe LVH/Atrial fibrillation Does not take Eliquis, though it is on his med list. Follows up with cardiology as outpatient.   Anxiety/depression Continue sertraline  History of parkinsonism. On carbidopa/levodopa   DVT prophylaxis: Ortho recommended Eliquis.  Will restart Eliquis starting tonight.   Code Status:  Full code  Family Communication: None today but spoke with patient's sister yesterday.  Status is: Inpatient  Remains inpatient appropriate because:Unsafe d/c plan, IV treatments appropriate due to intensity of illness or inability to take PO, metabolic encephalopathy, status post cervical surgery, awaiting PT OT evaluation, likely need for rehabilitation   Dispo: The patient is from: ALF/independent living              Anticipated d/c is to: Likely to skilled nursing facility, pending PT, OT evaluation.              Anticipated d/c date is: 2 days              Patient currently is not medically stable to d/c.  Consultants:  Orthopedics  Procedures:  Status post total right hip hemiarthroplasty on 08/14/2020  Anti-infectives:  . None  Anti-infectives (From admission, onward)   Start     Dose/Rate Route Frequency Ordered Stop   08/15/20 0500  ceFAZolin (ANCEF) IVPB 2g/100 mL premix        2 g 200 mL/hr over 30 Minutes Intravenous  Once 08/15/20 0446 08/15/20 0528   08/14/20 1830  ceFAZolin (ANCEF) IVPB 2g/100 mL premix        2 g 200 mL/hr over 30 Minutes Intravenous Every 6 hours 08/14/20 1719 08/15/20 0229   08/14/20 1330  ceFAZolin (ANCEF) IVPB 2g/100 mL premix        2 g 200 mL/hr over 30 Minutes  Intravenous On call to O.R. 08/14/20 1303 08/14/20 1311   08/14/20 1302  ceFAZolin (ANCEF) 2-4 GM/100ML-% IVPB       Note to Pharmacy: Bridget Hartshorn   : cabinet override      08/14/20 1302 08/14/20 1338     Subjective: Today, patient was seen and examined at bedside.  Patient feels better today.  Denies any dizziness lightheadedness shortness of breath or chest pain.   Objective: Vitals:   08/15/20 1730 08/15/20 1802  BP: (!) 112/99 132/62  Pulse: 83 85  Resp: 18 16  Temp:  98.1 F (36.7 C)  SpO2: 94% 96%    Intake/Output Summary (Last 24 hours) at 08/16/2020 0721 Last data filed at 08/15/2020 2130 Gross per 24 hour  Intake 2737.67 ml  Output 650 ml  Net 2087.67 ml   Filed Weights   08/13/20 0956 08/14/20 1708  Weight: 102.1 kg 102 kg   Body mass index is 30.5 kg/m.   Physical Exam:  General:  Average built, not in obvious distress, obese HENT:   No scleral pallor or icterus noted. Oral mucosa is moist.  Chest:  Clear breath sounds.  Diminished breath sounds bilaterally. No crackles or wheezes.  CVS: S1 &S2 heard. No murmur.  Regular rate and rhythm. Abdomen: Soft, nontender, nondistended.  Bowel sounds are heard.   Extremities: No cyanosis, clubbing or edema.  Peripheral pulses are palpable.  Right hip with dressing from recent surgery, right upper extremity tremors. Psych: Alert, awake and oriented, normal mood CNS:  No cranial nerve deficits.  Right upper extremity tremors Skin: Warm and dry.  No rashes noted.   Data Review: I have personally reviewed the following laboratory data and studies,  CBC: Recent Labs  Lab 08/13/20 1051 08/15/20 0743 08/15/20 1909 08/16/20 0530  WBC 5.5 7.0 6.4 6.0  NEUTROABS 4.2  --  4.8  --   HGB 12.2* 6.8* 7.6* 7.4*  HCT 39.3 22.4* 24.0* 22.6*  MCV 95.4 95.7 94.5 91.9  PLT 155 116* 127* [id number]*   Basic Metabolic Panel: Recent Labs  Lab 08/13/20 1051 08/15/20 0743 08/15/20 0959  NA 142 138 137  K 4.8 4.7 4.6  CL 105  108 108  CO2 27 21* 21*  GLUCOSE 72 115* 121*  BUN 46* 43* 41*  CREATININE 2.36* 2.06* 2.24*  CALCIUM 10.9* 9.7 9.8   Liver Function Tests: No results for input(s): AST, ALT, ALKPHOS, BILITOT, PROT, ALBUMIN in the last 168 hours. No results for input(s): LIPASE, AMYLASE in the last 168 hours. No results for input(s): AMMONIA in the last 168 hours. Cardiac Enzymes: No results for input(s): CKTOTAL, CKMB, CKMBINDEX, TROPONINI in the last 168 hours. BNP (last 3 results) No results for input(s): BNP in the last 8760 hours.  ProBNP (last 3 results) No results for input(s): PROBNP in the last 8760 hours.  CBG: Recent Labs  Lab 08/14/20 0517 08/15/20 0535 08/15/20 0950 08/16/20 0546  GLUCAP 122* 119* 126* 90   Recent Results (from the past  240 hour(s))  Resp Panel by RT-PCR (Flu A&B, Covid) Nasopharyngeal Swab     Status: None   Collection Time: 08/13/20 11:58 AM   Specimen: Nasopharyngeal Swab; Nasopharyngeal(NP) swabs in vial transport medium  Result Value Ref Range Status   SARS Coronavirus 2 by RT PCR NEGATIVE NEGATIVE Final    Comment: (NOTE) SARS-CoV-2 target nucleic acids are NOT DETECTED.  The SARS-CoV-2 RNA is generally detectable in upper respiratory specimens during the acute phase of infection. The lowest concentration of SARS-CoV-2 viral copies this assay can detect is 138 copies/mL. A negative result does not preclude SARS-Cov-2 infection and should not be used as the sole basis for treatment or other patient management decisions. A negative result may occur with  improper specimen collection/handling, submission of specimen other than nasopharyngeal swab, presence of viral mutation(s) within the areas targeted by this assay, and inadequate number of viral copies(<138 copies/mL). A negative result must be combined with clinical observations, patient history, and epidemiological information. The expected result is Negative.  Fact Sheet for Patients:   EntrepreneurPulse.com.au  Fact Sheet for Healthcare Providers:  IncredibleEmployment.be  This test is no t yet approved or cleared by the [place] FDA and  has been authorized for detection and/or diagnosis of SARS-CoV-2 by FDA under an Emergency Use Authorization (EUA). This EUA will remain  in effect (meaning this test can be used) for the duration of the COVID-19 declaration under Section 564(b)(1) of the Act, 21 U.S.C.section 360bbb-3(b)(1), unless the authorization is terminated  or revoked sooner.       Influenza A by PCR NEGATIVE NEGATIVE Final   Influenza B by PCR NEGATIVE NEGATIVE Final    Comment: (NOTE) The Xpert Xpress SARS-CoV-2/FLU/RSV plus assay is intended as an aid in the diagnosis of influenza from Nasopharyngeal swab specimens and should not be used as a sole basis for treatment. Nasal washings and aspirates are unacceptable for Xpert Xpress SARS-CoV-2/FLU/RSV testing.  Fact Sheet for Patients: EntrepreneurPulse.com.au  Fact Sheet for Healthcare Providers: IncredibleEmployment.be  This test is not yet approved or cleared by the [place] FDA and has been authorized for detection and/or diagnosis of SARS-CoV-2 by FDA under an Emergency Use Authorization (EUA). This EUA will remain in effect (meaning this test can be used) for the duration of the COVID-19 declaration under Section 564(b)(1) of the Act, 21 U.S.C. section 360bbb-3(b)(1), unless the authorization is terminated or revoked.  Performed at [facility], [place] [address], [place], [place] [address]      Studies: Pelvis Portable  Result Date: 08/14/2020 CLINICAL DATA:  Right total arthroplasty EXAM: PORTABLE PELVIS 1-2 VIEWS COMPARISON:  08/13/2020 right hip radiographs FINDINGS: Status post right total hip arthroplasty with well-positioned right acetabular and right proximal femoral prostheses. Skin  staples lateral to the right hip. No hip dislocation. No osseous fracture. No focal osseous lesion. Degenerative changes in the visualized lower lumbar spine. Expected postsurgical soft tissue gas surrounding the right hip. IMPRESSION: Satisfactory immediate postoperative appearance status post right total hip arthroplasty. Electronically Signed   By: Ilona Sorrel M.D.   On: 08/14/2020 16:07   DG C-Arm 1-60 Min-No Report  Result Date: 08/14/2020 Fluoroscopy was utilized by the requesting physician.  No radiographic interpretation.   DG HIP OPERATIVE UNILAT W OR W/O PELVIS RIGHT  Result Date: 08/14/2020 CLINICAL DATA:  Right total hip arthroplasty. EXAM: OPERATIVE RIGHT HIP (WITH PELVIS IF PERFORMED) 2 VIEWS TECHNIQUE: Fluoroscopic spot image(s) were submitted for interpretation post-operatively. COMPARISON:  August 13, 2020. FINDINGS:  Fluoro time: 13.3 seconds.  Reported radiation: 2.1442 mGy. Two C-arm fluoroscopic images were obtained intraoperatively and submitted for post operative interpretation. These images demonstrate postsurgical changes of right total hip arthroplasty. No unexpected findings. Please see the performing provider's procedural report for further detail. IMPRESSION: Intraoperative fluoroscopy, as detailed above. Electronically Signed   By: Margaretha Sheffield MD   On: 08/14/2020 14:50      Flora Lipps, MD  [facility] 08/16/2020  If 7PM-7AM, please contact night-coverage

## 2020-08-17 ENCOUNTER — Encounter (HOSPITAL_COMMUNITY): Payer: Self-pay | Admitting: Orthopedic Surgery

## 2020-08-17 DIAGNOSIS — S72001A Fracture of unspecified part of neck of right femur, initial encounter for closed fracture: Secondary | ICD-10-CM | POA: Diagnosis not present

## 2020-08-17 LAB — CBC
HCT: 21.5 % — ABNORMAL LOW (ref 39.0–52.0)
Hemoglobin: 6.8 g/dL — CL (ref 13.0–17.0)
MCH: 29.7 pg (ref 26.0–34.0)
MCHC: 31.6 g/dL (ref 30.0–36.0)
MCV: 93.9 fL (ref 80.0–100.0)
Platelets: 144 10*3/uL — ABNORMAL LOW (ref 150–400)
RBC: 2.29 MIL/uL — ABNORMAL LOW (ref 4.22–5.81)
RDW: 12.6 % (ref 11.5–15.5)
WBC: 5 10*3/uL (ref 4.0–10.5)
nRBC: 0 % (ref 0.0–0.2)

## 2020-08-17 LAB — BASIC METABOLIC PANEL
Anion gap: 9 (ref 5–15)
BUN: 35 mg/dL — ABNORMAL HIGH (ref 8–23)
CO2: 23 mmol/L (ref 22–32)
Calcium: 10 mg/dL (ref 8.9–10.3)
Chloride: 107 mmol/L (ref 98–111)
Creatinine, Ser: 1.38 mg/dL — ABNORMAL HIGH (ref 0.61–1.24)
GFR, Estimated: 52 mL/min — ABNORMAL LOW (ref >60)
Glucose, Bld: 101 mg/dL — ABNORMAL HIGH (ref 70–99)
Potassium: 4.5 mmol/L (ref 3.5–5.1)
Sodium: 139 mmol/L (ref 135–145)

## 2020-08-17 LAB — PREPARE RBC (CROSSMATCH)

## 2020-08-17 LAB — GLUCOSE, CAPILLARY: Glucose-Capillary: 94 mg/dL (ref 70–99)

## 2020-08-17 LAB — MAGNESIUM: Magnesium: 1.9 mg/dL (ref 1.7–2.4)

## 2020-08-17 MED ORDER — SODIUM CHLORIDE 0.9% IV SOLUTION: Freq: Once | INTRAVENOUS | Status: AC

## 2020-08-17 NOTE — Progress Notes (Signed)
PROGRESS NOTE    Fernando Carr  [id number] DOB: [birth year] DOA: 08/13/2020 PCP: [name], MD     Brief Narrative:  Fernando Carr is a 80 year old male  with past medical history of Parkinson disease, atrial fibrillationnot onEliquis, CKD 3a, AAA s/p repair 2015, hyperlipidemia, bipolar disorder, syncope, severe LVH who presented from [place] with concern for syncopal event with right hip pain.Patient states he typically ambulates with a Rollator. States that he was having breakfast when he was walking away from the table and believes that he had a an episode of loss of consciousness and woke up soon after on the floor. Does not believe he hit his head. Right hip x-ray revealedmildly displaced proximal right femoral neck fracture.  Patient underwent right total hip arthroplasty 1/14.  New events last 24 hours / Subjective: States that he feels awful, no specific complaints but overall not feeling well because he cannot move.  He denies any lightheadedness or dizziness, no significant pain in his right hip.  States that he has not had a bowel movement since admission.  Assessment & Plan:   Principal Problem:   Hip fracture ([place]) Active Problems:   CRD (chronic renal disease), stage III ([facility])   MDD (major depressive disorder), recurrent severe, without psychosis ([place])   Syncope   AKI (acute kidney injury) ([place])   LVH (left ventricular hypertrophy)   Proximal right femoral neck fracture status post fall -Status post right hip arthroplasty 1/14 -PT recommending SNF placement  Postoperative anemia from acute blood loss -Patient's hemoglobin on admission was 12.2 and trended to 6.8 after surgery -No other blood loss noted, patient has not had a bowel movement since admission -Transfused 1 unit packed red blood cells on 1/15, give another unit of blood today  Syncope -Concern for orthostatic hypotension -Repeat orthostatic vital signs after blood transfusion  completed  AKI on CKD stage IIIa -Baseline creatinine 1.45 -Resolved  Severe LVH, paroxysmal A. fib -Follows with Dr. Percival Spanish -Continue Eliquis  Anxiety/depression/bipolar -Continue Zoloft, Risperdal  Parkinsonism -Continue Sinemet  Acute metabolic encephalopathy -In setting of syncope, fall, surgical intervention -Resolved, back to baseline now    DVT prophylaxis:  apixaban (ELIQUIS) tablet 2.5 mg Start: 08/16/20 1130 SCDs Start: 08/14/20 1720 apixaban (ELIQUIS) tablet 2.5 mg  Code Status: DNR Family Communication: No family at bedside Disposition Plan:  Status is: Inpatient  Remains inpatient appropriate because:Unsafe d/c plan and IV treatments appropriate due to intensity of illness or inability to take PO   Dispo: The patient is from: ALF              Anticipated d/c is to: SNF              Anticipated d/c date is: 1 day              Patient currently is not medically stable to d/c.  Transfuse unit of blood today    Antimicrobials:  Anti-infectives (From admission, onward)   Start     Dose/Rate Route Frequency Ordered Stop   08/15/20 0500  ceFAZolin (ANCEF) IVPB 2g/100 mL premix        2 g 200 mL/hr over 30 Minutes Intravenous  Once 08/15/20 0446 08/15/20 0528   08/14/20 1830  ceFAZolin (ANCEF) IVPB 2g/100 mL premix        2 g 200 mL/hr over 30 Minutes Intravenous Every 6 hours 08/14/20 1719 08/15/20 0229   08/14/20 1330  ceFAZolin (ANCEF) IVPB 2g/100 mL premix  2 g 200 mL/hr over 30 Minutes Intravenous On call to O.R. 08/14/20 1303 08/14/20 1311   08/14/20 1302  ceFAZolin (ANCEF) 2-4 GM/100ML-% IVPB       Note to Pharmacy: Bridget Hartshorn   : cabinet override      08/14/20 1302 08/14/20 1338        Objective: Vitals:   08/16/20 1243 08/16/20 1600 08/16/20 2036 08/17/20 0502  BP: 117/79 117/65 126/63 107/60  Pulse: 74 72 72 63  Resp: (!) 21 (!) 21 20 20   Temp: 99.5 F (37.5 C)  98.2 F (36.8 C) 98.9 F (37.2 C)  TempSrc: Axillary   Oral Axillary  SpO2: 93% 90% 94% 93%  Weight:      Height:        Intake/Output Summary (Last 24 hours) at 08/17/2020 1043 Last data filed at 08/17/2020 0700 Gross per 24 hour  Intake 840 ml  Output 2575 ml  Net -1735 ml   Filed Weights   08/13/20 0956 08/14/20 1708  Weight: 102.1 kg 102 kg    Examination:  General exam: Appears calm and comfortable  Respiratory system: Clear to auscultation. Respiratory effort normal. No respiratory distress. No conversational dyspnea.  Cardiovascular system: S1 & S2 heard, RRR. No murmurs. No pedal edema. Gastrointestinal system: Abdomen is nondistended, soft and nontender. Normal bowel sounds heard. Central nervous system: Alert and oriented. No focal neurological deficits. Speech clear.  Extremities: Symmetric in appearance  Skin: Right hip examined, no significant bruising noted Psychiatry: Judgement and insight appear normal. Mood & affect appropriate.   Data Reviewed: I have personally reviewed following labs and imaging studies  CBC: Recent Labs  Lab 08/13/20 1051 08/15/20 0743 08/15/20 1909 08/16/20 0530 08/17/20 0643  WBC 5.5 7.0 6.4 6.0 5.0  NEUTROABS 4.2  --  4.8  --   --   HGB 12.2* 6.8* 7.6* 7.4* 6.8*  HCT 39.3 22.4* 24.0* 22.6* 21.5*  MCV 95.4 95.7 94.5 91.9 93.9  PLT 155 116* 127* 106* 902*   Basic Metabolic Panel: Recent Labs  Lab 08/13/20 1051 08/15/20 0743 08/15/20 0959 08/16/20 0530 08/16/20 1105 08/17/20 0643  NA 142 138 137 138  --  139  K 4.8 4.7 4.6 4.9  --  4.5  CL 105 108 108 110  --  107  CO2 27 21* 21* 16*  --  23  GLUCOSE 72 115* 121* 90  --  101*  BUN 46* 43* 41* 40*  --  35*  CREATININE 2.36* 2.06* 2.24* 1.62*  --  1.38*  CALCIUM 10.9* 9.7 9.8 9.6  --  10.0  MG  --   --   --  1.8  --  1.9  PHOS  --   --   --   --  1.9*  --    GFR: Estimated Creatinine Clearance: 53.7 mL/min (A) (by C-G formula based on SCr of 1.38 mg/dL (H)). Liver Function Tests: No results for input(s): AST, ALT,  ALKPHOS, BILITOT, PROT, ALBUMIN in the last 168 hours. No results for input(s): LIPASE, AMYLASE in the last 168 hours. No results for input(s): AMMONIA in the last 168 hours. Coagulation Profile: Recent Labs  Lab 08/15/20 1909  INR 1.2   Cardiac Enzymes: No results for input(s): CKTOTAL, CKMB, CKMBINDEX, TROPONINI in the last 168 hours. BNP (last 3 results) No results for input(s): PROBNP in the last 8760 hours. HbA1C: No results for input(s): HGBA1C in the last 72 hours. CBG: Recent Labs  Lab 08/15/20 0535 08/15/20 0950 08/16/20  6073 08/16/20 1614 08/17/20 0515  GLUCAP 119* 126* 90 104* 94   Lipid Profile: No results for input(s): CHOL, HDL, LDLCALC, TRIG, CHOLHDL, LDLDIRECT in the last 72 hours. Thyroid Function Tests: No results for input(s): TSH, T4TOTAL, FREET4, T3FREE, THYROIDAB in the last 72 hours. Anemia Panel: No results for input(s): VITAMINB12, FOLATE, FERRITIN, TIBC, IRON, RETICCTPCT in the last 72 hours. Sepsis Labs: Recent Labs  Lab 08/15/20 1157  LATICACIDVEN 1.0    Recent Results (from the past 240 hour(s))  Resp Panel by RT-PCR (Flu A&B, Covid) Nasopharyngeal Swab     Status: None   Collection Time: 08/13/20 11:58 AM   Specimen: Nasopharyngeal Swab; Nasopharyngeal(NP) swabs in vial transport medium  Result Value Ref Range Status   SARS Coronavirus 2 by RT PCR NEGATIVE NEGATIVE Final    Comment: (NOTE) SARS-CoV-2 target nucleic acids are NOT DETECTED.  The SARS-CoV-2 RNA is generally detectable in upper respiratory specimens during the acute phase of infection. The lowest concentration of SARS-CoV-2 viral copies this assay can detect is 138 copies/mL. A negative result does not preclude SARS-Cov-2 infection and should not be used as the sole basis for treatment or other patient management decisions. A negative result may occur with  improper specimen collection/handling, submission of specimen other than nasopharyngeal swab, presence of viral  mutation(s) within the areas targeted by this assay, and inadequate number of viral copies(<138 copies/mL). A negative result must be combined with clinical observations, patient history, and epidemiological information. The expected result is Negative.  Fact Sheet for Patients:  EntrepreneurPulse.com.au  Fact Sheet for Healthcare Providers:  IncredibleEmployment.be  This test is no t yet approved or cleared by the [place] FDA and  has been authorized for detection and/or diagnosis of SARS-CoV-2 by FDA under an Emergency Use Authorization (EUA). This EUA will remain  in effect (meaning this test can be used) for the duration of the COVID-19 declaration under Section 564(b)(1) of the Act, 21 U.S.C.section 360bbb-3(b)(1), unless the authorization is terminated  or revoked sooner.       Influenza A by PCR NEGATIVE NEGATIVE Final   Influenza B by PCR NEGATIVE NEGATIVE Final    Comment: (NOTE) The Xpert Xpress SARS-CoV-2/FLU/RSV plus assay is intended as an aid in the diagnosis of influenza from Nasopharyngeal swab specimens and should not be used as a sole basis for treatment. Nasal washings and aspirates are unacceptable for Xpert Xpress SARS-CoV-2/FLU/RSV testing.  Fact Sheet for Patients: EntrepreneurPulse.com.au  Fact Sheet for Healthcare Providers: IncredibleEmployment.be  This test is not yet approved or cleared by the [place] FDA and has been authorized for detection and/or diagnosis of SARS-CoV-2 by FDA under an Emergency Use Authorization (EUA). This EUA will remain in effect (meaning this test can be used) for the duration of the COVID-19 declaration under Section 564(b)(1) of the Act, 21 U.S.C. section 360bbb-3(b)(1), unless the authorization is terminated or revoked.  Performed at [facility], [place] [address], [place], [place] [address]       Radiology  Studies: No results found.    Scheduled Meds: . sodium chloride   Intravenous Once  . apixaban  2.5 mg Oral BID  . carbidopa-levodopa  1 tablet Oral TID AC  . Chlorhexidine Gluconate Cloth  6 each Topical Daily  . docusate sodium  100 mg Oral BID  . mouth rinse  15 mL Mouth Rinse BID  . risperiDONE  0.5 mg Oral QHS  . sertraline  50 mg Oral Daily   Continuous Infusions:  LOS: 4 days      Time spent: 35 minutes   Dessa Phi, DO [facility] 08/17/2020, 10:43 AM   Available via Epic secure chat 7am-7pm After these hours, please refer to coverage provider listed on [email or web address]

## 2020-08-17 NOTE — Plan of Care (Signed)
  Problem: Education: Goal: Knowledge of the prescribed therapeutic regimen will improve Outcome: Progressing Goal: Understanding of discharge needs will improve Outcome: Progressing   Problem: Activity: Goal: Ability to avoid complications of mobility impairment will improve Outcome: Progressing Goal: Ability to tolerate increased activity will improve Outcome: Progressing   Problem: Clinical Measurements: Goal: Postoperative complications will be avoided or minimized Outcome: Progressing   Problem: Pain Management: Goal: Pain level will decrease with appropriate interventions Outcome: Progressing   Problem: Skin Integrity: Goal: Will show signs of wound healing Outcome: Progressing   Problem: Education: Goal: Knowledge of General Education information will improve Description: Including pain rating scale, medication(s)/side effects and non-pharmacologic comfort measures Outcome: Progressing   Problem: Health Behavior/Discharge Planning: Goal: Ability to manage health-related needs will improve Outcome: Progressing   Problem: Clinical Measurements: Goal: Ability to maintain clinical measurements within normal limits will improve Outcome: Progressing Goal: Will remain free from infection Outcome: Progressing Goal: Diagnostic test results will improve Outcome: Progressing Goal: Respiratory complications will improve Outcome: Progressing Goal: Cardiovascular complication will be avoided Outcome: Progressing   Problem: Activity: Goal: Risk for activity intolerance will decrease Outcome: Progressing   Problem: Coping: Goal: Level of anxiety will decrease Outcome: Progressing   Problem: Elimination: Goal: Will not experience complications related to bowel motility Outcome: Progressing Goal: Will not experience complications related to urinary retention Outcome: Progressing   Problem: Pain Managment: Goal: General experience of comfort will improve Outcome:  Progressing   Problem: Safety: Goal: Ability to remain free from injury will improve Outcome: Progressing   Problem: Skin Integrity: Goal: Risk for impaired skin integrity will decrease Outcome: Progressing

## 2020-08-17 NOTE — Plan of Care (Signed)
  Problem: Education: Goal: Knowledge of the prescribed therapeutic regimen will improve Outcome: Progressing   Problem: Clinical Measurements: Goal: Postoperative complications will be avoided or minimized Outcome: Progressing   Problem: Pain Management: Goal: Pain level will decrease with appropriate interventions Outcome: Progressing   Problem: Skin Integrity: Goal: Will show signs of wound healing Outcome: Progressing   Problem: Education: Goal: Knowledge of General Education information will improve Description: Including pain rating scale, medication(s)/side effects and non-pharmacologic comfort measures Outcome: Progressing   Problem: Coping: Goal: Level of anxiety will decrease Outcome: Progressing   Problem: Elimination: Goal: Will not experience complications related to bowel motility Outcome: Progressing Goal: Will not experience complications related to urinary retention Outcome: Progressing   Problem: Pain Managment: Goal: General experience of comfort will improve Outcome: Progressing   Problem: Safety: Goal: Ability to remain free from injury will improve Outcome: Progressing   Problem: Skin Integrity: Goal: Risk for impaired skin integrity will decrease Outcome: Progressing

## 2020-08-17 NOTE — Progress Notes (Signed)
CRITICAL VALUE ALERT  Critical Value:  Hgb 6.8  Date & Time Notied:  08/17/2020 0739  Provider Notified: Dr. Maylene Roes  Orders Received/Actions taken: Transfuse 1 unit PRBC's

## 2020-08-18 DIAGNOSIS — S72001A Fracture of unspecified part of neck of right femur, initial encounter for closed fracture: Secondary | ICD-10-CM | POA: Diagnosis not present

## 2020-08-18 LAB — BASIC METABOLIC PANEL
Anion gap: 10 (ref 5–15)
BUN: 37 mg/dL — ABNORMAL HIGH (ref 8–23)
CO2: 22 mmol/L (ref 22–32)
Calcium: 10 mg/dL (ref 8.9–10.3)
Chloride: 106 mmol/L (ref 98–111)
Creatinine, Ser: 1.46 mg/dL — ABNORMAL HIGH (ref 0.61–1.24)
GFR, Estimated: 49 mL/min — ABNORMAL LOW (ref >60)
Glucose, Bld: 103 mg/dL — ABNORMAL HIGH (ref 70–99)
Potassium: 4.5 mmol/L (ref 3.5–5.1)
Sodium: 138 mmol/L (ref 135–145)

## 2020-08-18 LAB — BPAM RBC
Blood Product Expiration Date: 202202062359
Blood Product Expiration Date: 202202142359
ISSUE DATE / TIME: 202201151522
ISSUE DATE / TIME: 202201171130
Unit Type and Rh: 5100
Unit Type and Rh: 5100

## 2020-08-18 LAB — TYPE AND SCREEN
ABO/RH(D): O POS
Antibody Screen: NEGATIVE
Unit division: 0
Unit division: 0

## 2020-08-18 LAB — GLUCOSE, CAPILLARY: Glucose-Capillary: 98 mg/dL (ref 70–99)

## 2020-08-18 LAB — CBC
HCT: 24.1 % — ABNORMAL LOW (ref 39.0–52.0)
Hemoglobin: 7.5 g/dL — ABNORMAL LOW (ref 13.0–17.0)
MCH: 29.8 pg (ref 26.0–34.0)
MCHC: 31.1 g/dL (ref 30.0–36.0)
MCV: 95.6 fL (ref 80.0–100.0)
Platelets: 155 10*3/uL (ref 150–400)
RBC: 2.52 MIL/uL — ABNORMAL LOW (ref 4.22–5.81)
RDW: 12.8 % (ref 11.5–15.5)
WBC: 5.2 10*3/uL (ref 4.0–10.5)
nRBC: 0 % (ref 0.0–0.2)

## 2020-08-18 LAB — SARS CORONAVIRUS 2 (TAT 6-24 HRS): SARS Coronavirus 2: POSITIVE — AB

## 2020-08-18 NOTE — TOC Progression Note (Signed)
Transition of Care [facility]) - Progression Note    Patient Details  Name: Fernando Carr MRN: [id number] Date of Birth: 07-14-1941  [facility]) CM/SW Contact  Ross Ludwig, [place] [facility] Phone Number: 08/18/2020, 3:08 PM  Clinical Narrative:     CSW reviewed notes from weekend, patient's daughter would like CSW to look for SNF placement at all surrounding counties.  CSW faxed information to different facilities, awaiting for bed offers.  CSW attempted to contact patient's daughter, had to leave a voice mail awaiting for a call back.  CSW to continue to follow patient's progress throughout discharge planning.   Expected Discharge Plan: Skilled Nursing Facility Barriers to Discharge: [place] (PASRR),SNF Pending bed offer,SNF Pending discharge orders,Insurance Authorization,Continued Medical Work up  Expected Discharge Plan and Services Expected Discharge Plan: [place] In-house Referral: Clinical Social Work   Post Acute Care Choice: [facility] Living arrangements for the past 2 months: [facility] ([place])                                       Social Determinants of Health (SDOH) Interventions    Readmission Risk Interventions No flowsheet data found.

## 2020-08-18 NOTE — Progress Notes (Signed)
PROGRESS NOTE    Fernando Carr  NFA:[id number] DOB: 1941-04-24 DOA: 08/13/2020 PCP: [name], MD     Brief Narrative:  Fernando Carr is a 80 year old male  with past medical history of Parkinson disease, atrial fibrillationnot onEliquis, CKD 3a, AAA s/p repair 2015, hyperlipidemia, bipolar disorder, syncope, severe LVH who presented from [place] with concern for syncopal event with right hip pain.Patient states he typically ambulates with a Rollator. States that he was having breakfast when he was walking away from the table and believes that he had a an episode of loss of consciousness and woke up soon after on the floor. Does not believe he hit his head. Right hip x-ray revealedmildly displaced proximal right femoral neck fracture.  Patient underwent right total hip arthroplasty 1/14.  Hospitalization further complicated by postop blood loss anemia.  New events last 24 hours / Subjective: No new complaints, just waking up this morning on examination.  Assessment & Plan:   Principal Problem:   Hip fracture ([place]) Active Problems:   CRD (chronic renal disease), stage III (HCC)   MDD (major depressive disorder), recurrent severe, without psychosis ([place])   Syncope   AKI (acute kidney injury) ([place])   LVH (left ventricular hypertrophy)   Proximal right femoral neck fracture status post fall -Status post right hip arthroplasty 1/14 -PT recommending SNF placement  Postoperative anemia from acute blood loss -Patient's hemoglobin on admission was 12.2 and trended to 6.8 after surgery -No other blood loss noted, patient has not had a bowel movement since admission -Transfused total 2 unit packed red blood cells -Continue to monitor CBC  Syncope -Concern for orthostatic hypotension -Repeat orthostatic vital signs ordered today  AKI on CKD stage IIIa -Baseline creatinine 1.45 -Resolved  Severe LVH, paroxysmal A. fib -Follows with Dr. Percival Spanish -Continue  Eliquis  Anxiety/depression/bipolar -Continue Zoloft, Risperdal  Parkinsonism -Continue Sinemet  Acute metabolic encephalopathy -In setting of syncope, fall, surgical intervention -Resolved, back to baseline now    DVT prophylaxis:  apixaban (ELIQUIS) tablet 2.5 mg Start: 08/16/20 1130 SCDs Start: 08/14/20 1720 apixaban (ELIQUIS) tablet 2.5 mg  Code Status: DNR Family Communication: No family at bedside Disposition Plan:  Status is: Inpatient  Remains inpatient appropriate because:Unsafe d/c plan   Dispo: The patient is from: ALF              Anticipated d/c is to: SNF              Anticipated d/c date is: 1 day              Patient currently is medically stable to d/c.  SNF placement pending.   Antimicrobials:  Anti-infectives (From admission, onward)   Start     Dose/Rate Route Frequency Ordered Stop   08/15/20 0500  ceFAZolin (ANCEF) IVPB 2g/100 mL premix        2 g 200 mL/hr over 30 Minutes Intravenous  Once 08/15/20 0446 08/15/20 0528   08/14/20 1830  ceFAZolin (ANCEF) IVPB 2g/100 mL premix        2 g 200 mL/hr over 30 Minutes Intravenous Every 6 hours 08/14/20 1719 08/15/20 0229   08/14/20 1330  ceFAZolin (ANCEF) IVPB 2g/100 mL premix        2 g 200 mL/hr over 30 Minutes Intravenous On call to O.R. 08/14/20 1303 08/14/20 1311   08/14/20 1302  ceFAZolin (ANCEF) 2-4 GM/100ML-% IVPB       Note to Pharmacy: Bridget Hartshorn   : cabinet override  08/14/20 1302 08/14/20 1338       Objective: Vitals:   08/17/20 1152 08/17/20 1431 08/17/20 1600 08/18/20 0400  BP: 126/74 138/78 132/86   Pulse: 79 69 74   Resp: 19 20 17    Temp: 98.2 F (36.8 C) 98.3 F (36.8 C)  98 F (36.7 C)  TempSrc: Oral Oral  Oral  SpO2: 98% 91% 94%   Weight:      Height:        Intake/Output Summary (Last 24 hours) at 08/18/2020 1436 Last data filed at 08/18/2020 1000 Gross per 24 hour  Intake 360 ml  Output 1150 ml  Net -790 ml   Filed Weights   08/13/20 0956 08/14/20  1708  Weight: 102.1 kg 102 kg    Examination: General exam: Appears calm and comfortable  Respiratory system: Clear to auscultation. Respiratory effort normal. Cardiovascular system: S1 & S2 heard, RRR. No pedal edema. Gastrointestinal system: Abdomen is nondistended, soft and nontender. Normal bowel sounds heard. Central nervous system: Alert and oriented. Non focal exam. Speech clear  Extremities: Symmetric in appearance bilaterally  Skin: No rashes, lesions or ulcers on exposed skin, right hip with clean and dry dressing without significant bruising, soft to palpation Psychiatry: Judgement and insight appear stable. Mood & affect appropriate.   Data Reviewed: I have personally reviewed following labs and imaging studies  CBC: Recent Labs  Lab 08/13/20 1051 08/15/20 0743 08/15/20 1909 08/16/20 0530 08/17/20 0643 08/18/20 0539  WBC 5.5 7.0 6.4 6.0 5.0 5.2  NEUTROABS 4.2  --  4.8  --   --   --   HGB 12.2* 6.8* 7.6* 7.4* 6.8* 7.5*  HCT 39.3 22.4* 24.0* 22.6* 21.5* 24.1*  MCV 95.4 95.7 94.5 91.9 93.9 95.6  PLT 155 116* 127* 106* 144* 093   Basic Metabolic Panel: Recent Labs  Lab 08/15/20 0743 08/15/20 0959 08/16/20 0530 08/16/20 1105 08/17/20 0643 08/18/20 0539  NA 138 137 138  --  139 138  K 4.7 4.6 4.9  --  4.5 4.5  CL 108 108 110  --  107 106  CO2 21* 21* 16*  --  23 22  GLUCOSE 115* 121* 90  --  101* 103*  BUN 43* 41* 40*  --  35* 37*  CREATININE 2.06* 2.24* 1.62*  --  1.38* 1.46*  CALCIUM 9.7 9.8 9.6  --  10.0 10.0  MG  --   --  1.8  --  1.9  --   PHOS  --   --   --  1.9*  --   --    GFR: Estimated Creatinine Clearance: 50.7 mL/min (A) (by C-G formula based on SCr of 1.46 mg/dL (H)). Liver Function Tests: No results for input(s): AST, ALT, ALKPHOS, BILITOT, PROT, ALBUMIN in the last 168 hours. No results for input(s): LIPASE, AMYLASE in the last 168 hours. No results for input(s): AMMONIA in the last 168 hours. Coagulation Profile: Recent Labs  Lab  08/15/20 1909  INR 1.2   Cardiac Enzymes: No results for input(s): CKTOTAL, CKMB, CKMBINDEX, TROPONINI in the last 168 hours. BNP (last 3 results) No results for input(s): PROBNP in the last 8760 hours. HbA1C: No results for input(s): HGBA1C in the last 72 hours. CBG: Recent Labs  Lab 08/15/20 0950 08/16/20 0546 08/16/20 1614 08/17/20 0515 08/18/20 0431  GLUCAP 126* 90 104* 94 98   Lipid Profile: No results for input(s): CHOL, HDL, LDLCALC, TRIG, CHOLHDL, LDLDIRECT in the last 72 hours. Thyroid Function Tests: No results  for input(s): TSH, T4TOTAL, FREET4, T3FREE, THYROIDAB in the last 72 hours. Anemia Panel: No results for input(s): VITAMINB12, FOLATE, FERRITIN, TIBC, IRON, RETICCTPCT in the last 72 hours. Sepsis Labs: Recent Labs  Lab 08/15/20 1157  LATICACIDVEN 1.0    Recent Results (from the past 240 hour(s))  Resp Panel by RT-PCR (Flu A&B, Covid) Nasopharyngeal Swab     Status: None   Collection Time: 08/13/20 11:58 AM   Specimen: Nasopharyngeal Swab; Nasopharyngeal(NP) swabs in vial transport medium  Result Value Ref Range Status   SARS Coronavirus 2 by RT PCR NEGATIVE NEGATIVE Final    Comment: (NOTE) SARS-CoV-2 target nucleic acids are NOT DETECTED.  The SARS-CoV-2 RNA is generally detectable in upper respiratory specimens during the acute phase of infection. The lowest concentration of SARS-CoV-2 viral copies this assay can detect is 138 copies/mL. A negative result does not preclude SARS-Cov-2 infection and should not be used as the sole basis for treatment or other patient management decisions. A negative result may occur with  improper specimen collection/handling, submission of specimen other than nasopharyngeal swab, presence of viral mutation(s) within the areas targeted by this assay, and inadequate number of viral copies(<138 copies/mL). A negative result must be combined with clinical observations, patient history, and  epidemiological information. The expected result is Negative.  Fact Sheet for Patients:  EntrepreneurPulse.com.au  Fact Sheet for Healthcare Providers:  IncredibleEmployment.be  This test is no t yet approved or cleared by the [place] FDA and  has been authorized for detection and/or diagnosis of SARS-CoV-2 by FDA under an Emergency Use Authorization (EUA). This EUA will remain  in effect (meaning this test can be used) for the duration of the COVID-19 declaration under Section 564(b)(1) of the Act, 21 U.S.C.section 360bbb-3(b)(1), unless the authorization is terminated  or revoked sooner.       Influenza A by PCR NEGATIVE NEGATIVE Final   Influenza B by PCR NEGATIVE NEGATIVE Final    Comment: (NOTE) The Xpert Xpress SARS-CoV-2/FLU/RSV plus assay is intended as an aid in the diagnosis of influenza from Nasopharyngeal swab specimens and should not be used as a sole basis for treatment. Nasal washings and aspirates are unacceptable for Xpert Xpress SARS-CoV-2/FLU/RSV testing.  Fact Sheet for Patients: EntrepreneurPulse.com.au  Fact Sheet for Healthcare Providers: IncredibleEmployment.be  This test is not yet approved or cleared by the [place] FDA and has been authorized for detection and/or diagnosis of SARS-CoV-2 by FDA under an Emergency Use Authorization (EUA). This EUA will remain in effect (meaning this test can be used) for the duration of the COVID-19 declaration under Section 564(b)(1) of the Act, 21 U.S.C. section 360bbb-3(b)(1), unless the authorization is terminated or revoked.  Performed at [facility] [place] [address], [place], [place] [address]       Radiology Studies: No results found.    Scheduled Meds: . apixaban  2.5 mg Oral BID  . carbidopa-levodopa  1 tablet Oral TID AC  . docusate sodium  100 mg Oral BID  . mouth rinse  15 mL Mouth Rinse BID   . risperiDONE  0.5 mg Oral QHS  . sertraline  50 mg Oral Daily   Continuous Infusions:   LOS: 5 days      Time spent: 20 minutes   Dessa Phi, DO [facility] 08/18/2020, 2:36 PM   Available via Epic secure chat 7am-7pm After these hours, please refer to coverage provider listed on [email or web address]

## 2020-08-18 NOTE — Clinical Social Work Note (Signed)
30 Day [place] Note  RE: Fernando Carr. [place]   Date of Birth:  21-Sep-1942__   Date:08/18/2020        To Whom It May Concern:  Please be advised that the above-named patient will require a short-term nursing home stay - anticipated 30 days or less for rehabilitation and strengthening.  The plan is for return home.   Evette Cristal, MSW, [place] [facility] [phone]

## 2020-08-18 NOTE — Care Management Important Message (Signed)
Important Message  Patient Details IM Letter given to the Patient. Name: Fernando Carr MRN: [id number] Date of Birth: 11/24/1940   Medicare Important Message Given:  Yes     Kerin Salen 08/18/2020, 3:49 PM

## 2020-08-18 NOTE — Plan of Care (Signed)
  Problem: Activity: Goal: Ability to tolerate increased activity will improve Outcome: Progressing   Problem: Pain Management: Goal: Pain level will decrease with appropriate interventions Outcome: Progressing   Problem: Education: Goal: Knowledge of General Education information will improve Description: Including pain rating scale, medication(s)/side effects and non-pharmacologic comfort measures Outcome: Progressing   Problem: Clinical Measurements: Goal: Will remain free from infection Outcome: Progressing Goal: Respiratory complications will improve Outcome: Progressing

## 2020-08-18 NOTE — Progress Notes (Signed)
Physical Therapy Treatment Patient Details Name: Fernando Carr MRN: [id number] DOB: August 15, 1940 Today's Date: 08/18/2020    History of Present Illness Pt admitted from [facility] s/p fall with R hip fx and now s/p R THR by anterior direct approach.  Pt with hx of CKD, Parkinsons, bipolar, AAA, and A-fib    PT Comments    Pt progressing toward PT goals. See flow sheets for  BPs/VS. BP drop with sitting, incr with standing, after transfer to chair BP 121/85.  Pt denies dizziness or other symptoms. Continue to recommend SNF post acute   Follow Up Recommendations  SNF     Equipment Recommendations  Rolling walker with 5" wheels    Recommendations for Other Services       Precautions / Restrictions Precautions Precautions: Fall Precaution Comments: Orthostatic Hypotension. Restrictions Weight Bearing Restrictions: No RLE Weight Bearing: Weight bearing as tolerated    Mobility  Bed Mobility Overal bed mobility: Needs Assistance Bed Mobility: Supine to Sit     Supine to sit: Min assist;+2 for physical assistance;Mod assist;+2 for safety/equipment;HOB elevated     General bed mobility comments: Increased time with cues for sequence and use of L LE to self assist  Transfers Overall transfer level: Needs assistance Equipment used: Rolling walker (2 wheeled) Transfers: Sit to/from [facility] Sit to Stand: Min guard;+2 physical assistance;+2 safety/equipment Stand pivot transfers: Min assist;+2 safety/equipment       General transfer comment: incr time. cues for anterior-superior wt shift, LE management and use of UEs to self assist; repeated sit to stand trials x2  Ambulation/Gait             General Gait Details: pivotal steps bed to chair/fwd and back   Stairs             Wheelchair Mobility    Modified Rankin (Stroke Patients Only)       Balance   Sitting-balance support: Feet supported;No upper extremity supported Sitting  balance-Leahy Scale: Fair     Standing balance support: Bilateral upper extremity supported Standing balance-Leahy Scale: Poor                              Cognition Arousal/Alertness: Awake/alert Behavior During Therapy: WFL for tasks assessed/performed Overall Cognitive Status: Within Functional Limits for tasks assessed                                        Exercises      General Comments        Pertinent Vitals/Pain Pain Assessment: No/denies pain    Home Living                      Prior Function            PT Goals (current goals can now be found in the care plan section) Acute Rehab PT Goals Patient Stated Goal: Regain IND, return to [facility] PT Goal Formulation: With patient Time For Goal Achievement: 08/30/20 Potential to Achieve Goals: Good Progress towards PT goals: Progressing toward goals    Frequency    Min 3X/week      PT Plan Current plan remains appropriate    Co-evaluation              AM-PAC PT "6 Clicks" Mobility   Outcome Measure  Help  needed turning from your back to your side while in a flat bed without using bedrails?: A Lot Help needed moving from lying on your back to sitting on the side of a flat bed without using bedrails?: A Lot Help needed moving to and from a bed to a chair (including a wheelchair)?: A Little Help needed standing up from a chair using your arms (e.g., wheelchair or bedside chair)?: A Little Help needed to walk in hospital room?: A Lot Help needed climbing 3-5 steps with a railing? : A Lot 6 Click Score: 14    End of Session Equipment Utilized During Treatment: Gait belt Activity Tolerance: Patient tolerated treatment well Patient left: in chair;with call bell/phone within reach;with chair alarm set Nurse Communication: Mobility status PT Visit Diagnosis: Difficulty in walking, not elsewhere classified (R26.2)     Time: 0045-9977 PT Time Calculation  (min) (ACUTE ONLY): 26 min  Charges:  $Therapeutic Activity: 23-37 mins                     Baxter Flattery, PT  Acute Rehab Dept ([place]) [phone] Pager [phone]  08/18/2020    [facility] 08/18/2020, 4:28 PM

## 2020-08-19 DIAGNOSIS — N179 Acute kidney failure, unspecified: Secondary | ICD-10-CM | POA: Diagnosis not present

## 2020-08-19 DIAGNOSIS — F332 Major depressive disorder, recurrent severe without psychotic features: Secondary | ICD-10-CM | POA: Diagnosis not present

## 2020-08-19 DIAGNOSIS — S72001A Fracture of unspecified part of neck of right femur, initial encounter for closed fracture: Secondary | ICD-10-CM | POA: Diagnosis not present

## 2020-08-19 DIAGNOSIS — R55 Syncope and collapse: Secondary | ICD-10-CM | POA: Diagnosis not present

## 2020-08-19 DIAGNOSIS — U071 COVID-19: Secondary | ICD-10-CM

## 2020-08-19 LAB — RESP PANEL BY RT-PCR (RSV, FLU A&B, COVID)  RVPGX2
Influenza A by PCR: NEGATIVE
Influenza B by PCR: NEGATIVE
Resp Syncytial Virus by PCR: NEGATIVE
SARS Coronavirus 2 by RT PCR: POSITIVE — AB

## 2020-08-19 LAB — GLUCOSE, CAPILLARY: Glucose-Capillary: 90 mg/dL (ref 70–99)

## 2020-08-19 LAB — BASIC METABOLIC PANEL
Anion gap: 8 (ref 5–15)
BUN: 41 mg/dL — ABNORMAL HIGH (ref 8–23)
CO2: 23 mmol/L (ref 22–32)
Calcium: 10.4 mg/dL — ABNORMAL HIGH (ref 8.9–10.3)
Chloride: 107 mmol/L (ref 98–111)
Creatinine, Ser: 1 mg/dL (ref 0.61–1.24)
GFR, Estimated: >60 mL/min (ref >60)
Glucose, Bld: 102 mg/dL — ABNORMAL HIGH (ref 70–99)
Potassium: 4.7 mmol/L (ref 3.5–5.1)
Sodium: 138 mmol/L (ref 135–145)

## 2020-08-19 LAB — CBC
HCT: 23.6 % — ABNORMAL LOW (ref 39.0–52.0)
Hemoglobin: 7.4 g/dL — ABNORMAL LOW (ref 13.0–17.0)
MCH: 30.2 pg (ref 26.0–34.0)
MCHC: 31.4 g/dL (ref 30.0–36.0)
MCV: 96.3 fL (ref 80.0–100.0)
Platelets: 173 10*3/uL (ref 150–400)
RBC: 2.45 MIL/uL — ABNORMAL LOW (ref 4.22–5.81)
RDW: 12.6 % (ref 11.5–15.5)
WBC: 4.8 10*3/uL (ref 4.0–10.5)
nRBC: 0 % (ref 0.0–0.2)

## 2020-08-19 NOTE — Progress Notes (Signed)
Patient transferred  to room 1439 from 1405 around 0100. Alert, denies any pain or discomfort. Oriented to new room, call bell within reach. Agreed with nurse assessment of patient and will cont to monitor.

## 2020-08-19 NOTE — Progress Notes (Signed)
PROGRESS NOTE  Fernando Carr [id number] DOB: 1940-10-22   PCP: [name], MD  Patient is from: ALF  DOA: 08/13/2020 LOS: 6  Chief complaints: Syncopal fall  Brief Narrative / Interim history: 80 year old male with PMH of Parkinson disease, A. fib on Eliquis, CKD-3A, AAA status postrepair in 2015, HLD, bipolar disorder, syncope and severe LVH brought to ED from [facility]F with possible syncope and right hip pain and found to have mildly displaced proximal right femoral neck fracture on x-ray.  Patient underwent right total hip arthroplasty on 08/14/2020.  Hospitalization complicated by postop acute blood loss anemia and AKI.  Therapy recommended SNF.  However, he tested positive for COVID-19 prior to discharge.   Subjective: Seen and examined earlier this morning.  No major events overnight of this morning.  Sitting on bedside chair eating his lunch.  No complaints.   Objective: Vitals:   08/18/20 1702 08/18/20 2000 08/19/20 0634 08/19/20 1316  BP:   139/67 (!) 90/55  Pulse:   63 63  Resp:   18 19  Temp:  97.6 F (36.4 C) 97.8 F (36.6 C) 98.5 F (36.9 C)  TempSrc:  Oral Oral Oral  SpO2: 99%  92% 96%  Weight:      Height:        Intake/Output Summary (Last 24 hours) at 08/19/2020 1706 Last data filed at 08/19/2020 0700 Gross per 24 hour  Intake 240 ml  Output 1750 ml  Net -1510 ml   Filed Weights   08/13/20 0956 08/14/20 1708  Weight: 102.1 kg 102 kg    Examination:  GENERAL: No apparent distress.  Nontoxic. HEENT: MMM.  Vision and hearing grossly intact.  NECK: Supple.  No apparent JVD.  RESP: On RA.  No IWOB.  Fair aeration bilaterally. CVS:  RRR. Heart sounds normal.  ABD/GI/GU: BS+. Abd soft, NTND.  MSK/EXT:  Moves extremities. No apparent deformity. No edema.  SKIN: no apparent skin lesion or wound NEURO: Awake, alert and oriented appropriately.  No apparent focal neuro deficit other than resting tremors PSYCH: Calm. Normal  affect.  Procedures:  1/14-right hip arthroplasty  Microbiology summarized: 1/13-influenza and COVID-19 PCR negative 1/18-COVID-19 PCR positive  Assessment & Plan: Proximal right femoral neck fracture status post fall -Status post right hip arthroplasty 1/14 -PT recommending SNF placement  Postoperative anemia from acute blood loss: Hgb 12.2 on admit>> 6.8 postop>2u> 7.5> 7.4 -Check anemia panel in the morning -Continue monitoring  Syncope: Orthostatic hypotension?  Orthostatic vitals taken but incomplete. -Repeat orthostatic vitals in the morning  AKI on CKD stage IIIa: Baseline Cr 1.45>> 2.36 (admit)>> 1.00.  Resolved. -Avoid nephrotoxic meds.  COVID-19 infection-incidental finding.  Tested positive for COVID-19 infection on 1/18 prior to discharge to SNF.  He was negative on admission.  Asymptomatic. Vaccinated with booster two months.  -Isolation precaution for 5 days -We will check inflammatory markers in the morning.  Severe LVH, paroxysmal A. fib -Follows with Dr. Percival Spanish -Continue Eliquis  Anxiety/depression/bipolar -Continue Zoloft, Risperdal  Parkinsonism -Continue Sinemet  Acute metabolic encephalopathy -In setting of syncope, fall, surgical intervention -Resolved, back to baseline now   Body mass index is 30.5 kg/m.         DVT prophylaxis:  apixaban (ELIQUIS) tablet 2.5 mg Start: 08/16/20 1130 SCDs Start: 08/14/20 1720 apixaban (ELIQUIS) tablet 2.5 mg  Code Status: DNR/DNI Family Communication: Updated his sister, Arbie Cookey over the phone Status is: Inpatient  Remains inpatient appropriate because:Unsafe d/c plan and Inpatient level of care appropriate due  to severity of illness   Dispo: The patient is from: ALF              Anticipated d/c is to: SNF              Anticipated d/c date is: > 3 days              Patient currently is not medically stable to d/c.       Consultants:  Orthopedic surgery   Sch Meds:  Scheduled  Meds: . apixaban  2.5 mg Oral BID  . carbidopa-levodopa  1 tablet Oral TID AC  . docusate sodium  100 mg Oral BID  . mouth rinse  15 mL Mouth Rinse BID  . risperiDONE  0.5 mg Oral QHS  . sertraline  50 mg Oral Daily   Continuous Infusions: PRN Meds:.HYDROcodone-acetaminophen, menthol-cetylpyridinium **OR** phenol, metoCLOPramide **OR** metoCLOPramide (REGLAN) injection, morphine injection, ondansetron **OR** ondansetron (ZOFRAN) IV, senna-docusate  Antimicrobials: Anti-infectives (From admission, onward)   Start     Dose/Rate Route Frequency Ordered Stop   08/15/20 0500  ceFAZolin (ANCEF) IVPB 2g/100 mL premix        2 g 200 mL/hr over 30 Minutes Intravenous  Once 08/15/20 0446 08/15/20 0528   08/14/20 1830  ceFAZolin (ANCEF) IVPB 2g/100 mL premix        2 g 200 mL/hr over 30 Minutes Intravenous Every 6 hours 08/14/20 1719 08/15/20 0229   08/14/20 1330  ceFAZolin (ANCEF) IVPB 2g/100 mL premix        2 g 200 mL/hr over 30 Minutes Intravenous On call to O.R. 08/14/20 1303 08/14/20 1311   08/14/20 1302  ceFAZolin (ANCEF) 2-4 GM/100ML-% IVPB       Note to Pharmacy: Bridget Hartshorn   : cabinet override      08/14/20 1302 08/14/20 1338       I have personally reviewed the following labs and images: CBC: Recent Labs  Lab 08/13/20 1051 08/15/20 0743 08/15/20 1909 08/16/20 0530 08/17/20 0643 08/18/20 0539 08/19/20 0447  WBC 5.5   < > 6.4 6.0 5.0 5.2 4.8  NEUTROABS 4.2  --  4.8  --   --   --   --   HGB 12.2*   < > 7.6* 7.4* 6.8* 7.5* 7.4*  HCT 39.3   < > 24.0* 22.6* 21.5* 24.1* 23.6*  MCV 95.4   < > 94.5 91.9 93.9 95.6 96.3  PLT 155   < > 127* 106* 144* 155 173   < > = values in this interval not displayed.   BMP &GFR Recent Labs  Lab 08/15/20 0959 08/16/20 0530 08/16/20 1105 08/17/20 0643 08/18/20 0539 08/19/20 0447  NA 137 138  --  139 138 138  K 4.6 4.9  --  4.5 4.5 4.7  CL 108 110  --  107 106 107  CO2 21* 16*  --  23 22 23   GLUCOSE 121* 90  --  101* 103* 102*   BUN 41* 40*  --  35* 37* 41*  CREATININE 2.24* 1.62*  --  1.38* 1.46* 1.00  CALCIUM 9.8 9.6  --  10.0 10.0 10.4*  MG  --  1.8  --  1.9  --   --   PHOS  --   --  1.9*  --   --   --    Estimated Creatinine Clearance: 74 mL/min (by C-G formula based on SCr of 1 mg/dL). Liver & Pancreas: No results for input(s): AST, ALT, ALKPHOS, BILITOT,  PROT, ALBUMIN in the last 168 hours. No results for input(s): LIPASE, AMYLASE in the last 168 hours. No results for input(s): AMMONIA in the last 168 hours. Diabetic: No results for input(s): HGBA1C in the last 72 hours. Recent Labs  Lab 08/16/20 0546 08/16/20 1614 08/17/20 0515 08/18/20 0431 08/19/20 0640  GLUCAP 90 104* 94 98 90   Cardiac Enzymes: No results for input(s): CKTOTAL, CKMB, CKMBINDEX, TROPONINI in the last 168 hours. No results for input(s): PROBNP in the last 8760 hours. Coagulation Profile: Recent Labs  Lab 08/15/20 1909  INR 1.2   Thyroid Function Tests: No results for input(s): TSH, T4TOTAL, FREET4, T3FREE, THYROIDAB in the last 72 hours. Lipid Profile: No results for input(s): CHOL, HDL, LDLCALC, TRIG, CHOLHDL, LDLDIRECT in the last 72 hours. Anemia Panel: No results for input(s): VITAMINB12, FOLATE, FERRITIN, TIBC, IRON, RETICCTPCT in the last 72 hours. Urine analysis:    Component Value Date/Time   COLORURINE YELLOW 09/26/2019 1939   APPEARANCEUR CLEAR 09/26/2019 1939   LABSPEC 1.020 09/26/2019 1939   PHURINE 6.5 09/26/2019 1939   GLUCOSEU NEGATIVE 09/26/2019 1939   HGBUR NEGATIVE 09/26/2019 1939   BILIRUBINUR NEGATIVE 09/26/2019 1939   KETONESUR NEGATIVE 09/26/2019 1939   PROTEINUR 30 (A) 09/26/2019 1939   UROBILINOGEN 1.0 08/19/2014 1745   NITRITE NEGATIVE 09/26/2019 1939   LEUKOCYTESUR NEGATIVE 09/26/2019 1939   Sepsis Labs: Invalid input(s): PROCALCITONIN, [place]  Microbiology: Recent Results (from the past 240 hour(s))  Resp Panel by RT-PCR (Flu A&B, Covid) Nasopharyngeal Swab     Status: None    Collection Time: 08/13/20 11:58 AM   Specimen: Nasopharyngeal Swab; Nasopharyngeal(NP) swabs in vial transport medium  Result Value Ref Range Status   SARS Coronavirus 2 by RT PCR NEGATIVE NEGATIVE Final    Comment: (NOTE) SARS-CoV-2 target nucleic acids are NOT DETECTED.  The SARS-CoV-2 RNA is generally detectable in upper respiratory specimens during the acute phase of infection. The lowest concentration of SARS-CoV-2 viral copies this assay can detect is 138 copies/mL. A negative result does not preclude SARS-Cov-2 infection and should not be used as the sole basis for treatment or other patient management decisions. A negative result may occur with  improper specimen collection/handling, submission of specimen other than nasopharyngeal swab, presence of viral mutation(s) within the areas targeted by this assay, and inadequate number of viral copies(<138 copies/mL). A negative result must be combined with clinical observations, patient history, and epidemiological information. The expected result is Negative.  Fact Sheet for Patients:  EntrepreneurPulse.com.au  Fact Sheet for Healthcare Providers:  IncredibleEmployment.be  This test is no t yet approved or cleared by the [place] FDA and  has been authorized for detection and/or diagnosis of SARS-CoV-2 by FDA under an Emergency Use Authorization (EUA). This EUA will remain  in effect (meaning this test can be used) for the duration of the COVID-19 declaration under Section 564(b)(1) of the Act, 21 U.S.C.section 360bbb-3(b)(1), unless the authorization is terminated  or revoked sooner.       Influenza A by PCR NEGATIVE NEGATIVE Final   Influenza B by PCR NEGATIVE NEGATIVE Final    Comment: (NOTE) The Xpert Xpress SARS-CoV-2/FLU/RSV plus assay is intended as an aid in the diagnosis of influenza from Nasopharyngeal swab specimens and should not be used as a sole basis for treatment.  Nasal washings and aspirates are unacceptable for Xpert Xpress SARS-CoV-2/FLU/RSV testing.  Fact Sheet for Patients: EntrepreneurPulse.com.au  Fact Sheet for Healthcare Providers: IncredibleEmployment.be  This test is not yet approved or  cleared by the [place] and has been authorized for detection and/or diagnosis of SARS-CoV-2 by FDA under an Emergency Use Authorization (EUA). This EUA will remain in effect (meaning this test can be used) for the duration of the COVID-19 declaration under Section 564(b)(1) of the Act, 21 U.S.C. section 360bbb-3(b)(1), unless the authorization is terminated or revoked.  Performed at [facility], [place] [address] [place], [place] [address]   SARS CORONAVIRUS 2 (TAT 6-24 HRS) Nasopharyngeal Nasopharyngeal Swab     Status: Abnormal   Collection Time: 08/18/20 11:55 AM   Specimen: Nasopharyngeal Swab  Result Value Ref Range Status   SARS Coronavirus 2 POSITIVE (A) NEGATIVE Final    Comment: (NOTE) SARS-CoV-2 target nucleic acids are DETECTED.  The SARS-CoV-2 RNA is generally detectable in upper and lower respiratory specimens during the acute phase of infection. Positive results are indicative of the presence of SARS-CoV-2 RNA. Clinical correlation with patient history and other diagnostic information is  necessary to determine patient infection status. Positive results do not rule out bacterial infection or co-infection with other viruses.  The expected result is Negative.  Fact Sheet for Patients: SugarRoll.be  Fact Sheet for Healthcare Providers: https://www.woods-mathews.com/  This test is not yet approved or cleared by the [place] FDA and  has been authorized for detection and/or diagnosis of SARS-CoV-2 by FDA under an Emergency Use Authorization (EUA). This EUA will remain  in effect (meaning this test can be used) for the  duration of the COVID-19 declaration under Section 564(b)(1) of the Act, 21 U. S.C. section 360bbb-3(b)(1), unless the authorization is terminated or revoked sooner.   Performed at [facility], [place] [address], [place], [place] [address]   Resp panel by RT-PCR (RSV, Flu A&B, Covid)     Status: Abnormal   Collection Time: 08/18/20 11:30 PM  Result Value Ref Range Status   SARS Coronavirus 2 by RT PCR POSITIVE (A) NEGATIVE Final    Comment: CRITICAL RESULT CALLED TO, READ BACK BY AND VERIFIED WITH: RN K CUMINGS AT 0056 08/19/20 CRUICKSHANK A (NOTE) SARS-CoV-2 target nucleic acids are DETECTED.  The SARS-CoV-2 RNA is generally detectable in upper respiratory specimens during the acute phase of infection. Positive results are indicative of the presence of the identified virus, but do not rule out bacterial infection or co-infection with other pathogens not detected by the test. Clinical correlation with patient history and other diagnostic information is necessary to determine patient infection status. The expected result is Negative.  Fact Sheet for Patients: EntrepreneurPulse.com.au  Fact Sheet for Healthcare Providers: IncredibleEmployment.be  This test is not yet approved or cleared by the [place] FDA and  has been authorized for detection and/or diagnosis of SARS-CoV-2 by FDA under an Emergency Use Authorization (EUA).  This EUA will remain in effect (meanin g this test can be used) for the duration of  the COVID-19 declaration under Section 564(b)(1) of the Act, 21 U.S.C. section 360bbb-3(b)(1), unless the authorization is terminated or revoked sooner.     Influenza A by PCR NEGATIVE NEGATIVE Final   Influenza B by PCR NEGATIVE NEGATIVE Final    Comment: (NOTE) The Xpert Xpress SARS-CoV-2/FLU/RSV plus assay is intended as an aid in the diagnosis of influenza from Nasopharyngeal swab specimens and should not be used as a  sole basis for treatment. Nasal washings and aspirates are unacceptable for Xpert Xpress SARS-CoV-2/FLU/RSV testing.  Fact Sheet for Patients: EntrepreneurPulse.com.au  Fact Sheet for Healthcare Providers: IncredibleEmployment.be  This  test is not yet approved or cleared by the [place] and has been authorized for detection and/or diagnosis of SARS-CoV-2 by FDA under an Emergency Use Authorization (EUA). This EUA will remain in effect (meaning this test can be used) for the duration of the COVID-19 declaration under Section 564(b)(1) of the Act, 21 U.S.C. section 360bbb-3(b)(1), unless the authorization is terminated or revoked.     Resp Syncytial Virus by PCR NEGATIVE NEGATIVE Final    Comment: (NOTE) Fact Sheet for Patients: EntrepreneurPulse.com.au  Fact Sheet for Healthcare Providers: IncredibleEmployment.be  This test is not yet approved or cleared by the [place] FDA and has been authorized for detection and/or diagnosis of SARS-CoV-2 by FDA under an Emergency Use Authorization (EUA). This EUA will remain in effect (meaning this test can be used) for the duration of the COVID-19 declaration under Section 564(b)(1) of the Act, 21 U.S.C. section 360bbb-3(b)(1), unless the authorization is terminated or revoked.  Performed at [facility], [place] [address], [place], [place] [address]     Radiology Studies: No results found.    Taye T. [place]  If 7PM-7AM, please contact night-coverage [email or web address] 08/19/2020, 5:06 PM

## 2020-08-19 NOTE — TOC Progression Note (Signed)
Transition of Care [facility]) - Progression Note    Patient Details  Name: Fernando Carr MRN: [id number] Date of Birth: Jul 24, 1941  Transition of [facility]) CM/SW [place], [place] Phone Number: [phone] 08/19/2020, 10:56 AM  Clinical Narrative:     Received call from [place] [facility] for PASSR interview for this patient.  CSW gave [facility] representative Randall Hiss CSW's contact information.  Expected Discharge Plan: Skilled Nursing Facility Barriers to Discharge: [facility] (PASRR),SNF Pending bed offer,SNF Pending discharge orders,Insurance Authorization,Continued Medical Work up  Expected Discharge Plan and Services Expected Discharge Plan: [place] In-house Referral: Clinical Social Work   Post Acute Care Choice: [place] Living arrangements for the past 2 months: [place] ([place])                                       Social Determinants of Health (SDOH) Interventions    Readmission Risk Interventions No flowsheet data found.

## 2020-08-19 NOTE — Progress Notes (Signed)
Attempted to call pt's Sister, Arbie Cookey, to update her on pt's positive COVID test and transfer to 1439. No answer and no voicemail box available. Will pass on to next shift to try again.

## 2020-08-19 NOTE — Progress Notes (Signed)
Occupational Therapy Treatment Patient Details Name: Fernando Carr MRN: [id number] DOB: [birth year] Today's Date: 08/19/2020    History of present illness Pt admitted from [facility] s/p fall with R hip fx and now s/p R THR by anterior direct approach.  Pt with hx of CKD, Parkinsons, bipolar, AAA, and A-fib. Tested positive for COVID 1/18.   OT comments  Patient supine in bed and when asked "How are you" patient states "I don't know." Patient had pulled his condom catheter, [place] and hip bandage off and bed linens drenched in urine. Patient's o2 sat on RA 93%. Patient max assist to transfer to side of bed to perform ADL tasks. Patient set up to wash his UB - though quality was poor and patient able to wash top of his thighs. Patient min guard to transfer to [facility] with RW and verbal cues for hand placement. Patient total care for toileting due to his reliance on walker. After toileting patient able to take steps to recliner with RW, min guard and verbal cues for technique. Patient's o2 sat maintained between 95-97% on RA with activity. Patient setup for breakfast and left in care of RN.   Follow Up Recommendations  SNF    Equipment Recommendations  None recommended by OT    Recommendations for Other Services      Precautions / Restrictions Precautions Precautions: Fall Restrictions RLE Weight Bearing: Weight bearing as tolerated       Mobility Bed Mobility Overal bed mobility: Needs Assistance Bed Mobility: Supine to Sit     Supine to sit: HOB elevated;Max assist     General bed mobility comments: Assistance for RLE and trunk negotiation with supine to sit.  Transfers Overall transfer level: Needs assistance Equipment used: Rolling walker (2 wheeled) Transfers: Sit to/from [facility] Sit to Stand: Min guard;From elevated surface Stand pivot transfers: Min guard       General transfer comment: Min guard to stand and transfer to [facility] with RW and verbal cues for  hand placement. Patient able to ambulate two feet and turn and sit in recliner.    Balance Overall balance assessment: Needs assistance Sitting-balance support: Feet supported Sitting balance-Leahy Scale: Fair     Standing balance support: Bilateral upper extremity supported Standing balance-Leahy Scale: Poor                             ADL either performed or assessed with clinical judgement   ADL           Upper Body Bathing: Set up;Sitting Upper Body Bathing Details (indicate cue type and reason): partial bathing sitting at side of bed.  Siiting at side of bed. Lower Body Bathing: Maximal assistance;Sit to/from stand Lower Body Bathing Details (indicate cue type and reason): Patient able to wash top of thighs  sitting at side of bed.         Toilet Transfer: Min Forensic psychologist Details (indicate cue type and reason): Patient transferred to [facility] placed on his left with use of RW. Min guard from therapist. verbal cues for hand placement. Toileting- Clothing Manipulation and Hygiene: Total assistance;Sit to/from stand Toileting - Clothing Manipulation Details (indicate cue type and reason): Total assist for pericare and clothing management.             Vision Baseline Vision/History: Wears glasses Wears Glasses: Reading only Patient Visual Report: No change from baseline     Perception  Praxis      Cognition Arousal/Alertness: Awake/alert Behavior During Therapy: WFL for tasks assessed/performed Overall Cognitive Status: Within Functional Limits for tasks assessed                                          Exercises     Shoulder Instructions       General Comments      Pertinent Vitals/ Pain       Pain Assessment: Faces Faces Pain Scale: Hurts a little bit Pain Location: R hip Pain Descriptors / Indicators: Aching;Sore Pain Intervention(s): Monitored during session  Home Living                                           Prior Functioning/Environment              Frequency  Min 2X/week        Progress Toward Goals  OT Goals(current goals can now be found in the care plan section)  Progress towards OT goals: Progressing toward goals  Acute Rehab OT Goals Patient Stated Goal: Regain IND, return to [facility] OT Goal Formulation: With patient Time For Goal Achievement: 08/30/20 Potential to Achieve Goals: Good  Plan      Co-evaluation          OT goals addressed during session: ADL's and self-care (functional mobility)      AM-PAC OT "6 Clicks" Daily Activity     Outcome Measure   Help from another person eating meals?: None Help from another person taking care of personal grooming?: A Little Help from another person toileting, which includes using toliet, bedpan, or urinal?: A Lot Help from another person bathing (including washing, rinsing, drying)?: A Lot Help from another person to put on and taking off regular upper body clothing?: A Little Help from another person to put on and taking off regular lower body clothing?: Total 6 Click Score: 15    End of Session Equipment Utilized During Treatment: Rolling walker  OT Visit Diagnosis: Unsteadiness on feet (R26.81);Other abnormalities of gait and mobility (R26.89);History of falling (Z91.81);Other symptoms and signs involving the nervous system (R29.898);Dizziness and giddiness (R42);Pain   Activity Tolerance Patient tolerated treatment well   Patient Left in chair;with call bell/phone within reach;with chair alarm set;with nursing/sitter in room   Nurse Communication Mobility status        Time: 1761-6073 OT Time Calculation (min): 35 min  Charges: OT General Charges $OT Visit: 1 Visit OT Treatments $Self Care/Home Management : 8-22 mins $Therapeutic Activity: 8-22 mins  Derl Barrow, OTR/L [place]  Office [phone] Pager: [place] 08/19/2020, 12:27 PM

## 2020-08-19 NOTE — Progress Notes (Signed)
Physical Therapy Treatment Patient Details Name: Fernando Carr MRN: [id number] DOB: Nov 03, 1940 Today's Date: 08/19/2020    History of Present Illness Pt admitted from [facility] s/p fall with R hip fx and now s/p R THR by anterior direct approach.  Pt with hx of CKD, Parkinsons, bipolar, AAA, and A-fib    PT Comments    Exercise focused session as pt was up chair earlier today and deferred OOB. Pt denies hip pain, participated well with THA HEP.   Follow Up Recommendations  SNF     Equipment Recommendations  Rolling walker with 5" wheels    Recommendations for Other Services       Precautions / Restrictions Precautions Precautions: Fall Restrictions RLE Weight Bearing: Weight bearing as tolerated    Mobility  Bed Mobility Overal bed mobility: Needs Assistance Bed Mobility: Supine to Sit     Supine to sit: HOB elevated;Max assist     General bed mobility comments: NT-pt OOB to chair earlier today with OT  Transfers Overall transfer level: Needs assistance Equipment used: Rolling walker (2 wheeled) Transfers: Sit to/from [facility] Sit to Stand: Min guard;From elevated surface Stand pivot transfers: Min guard       General transfer comment: Min guard to stand and transfer to [facility] with RW and verbal cues for hand placement. Patient able to ambulate two feet and turn and sit in recliner.  Ambulation/Gait                 Stairs             Wheelchair Mobility    Modified Rankin (Stroke Patients Only)       Balance Overall balance assessment: Needs assistance Sitting-balance support: Feet supported Sitting balance-Leahy Scale: Fair     Standing balance support: Bilateral upper extremity supported Standing balance-Leahy Scale: Poor                              Cognition Arousal/Alertness: Awake/alert Behavior During Therapy: WFL for tasks assessed/performed Overall Cognitive Status: Within Functional Limits  for tasks assessed                                        Exercises General Exercises - Lower Extremity Ankle Circles/Pumps: AROM;Both;10 reps Quad Sets: AROM;Both;10 reps Short Arc Quad: AROM;Right;10 reps Heel Slides: Both;15 reps;AROM;AAROM Hip ABduction/ADduction: AAROM;Right;10 reps    General Comments        Pertinent Vitals/Pain Pain Assessment: No/denies pain Faces Pain Scale: Hurts a little bit Pain Location: R hip Pain Descriptors / Indicators: Aching;Sore Pain Intervention(s): Monitored during session    Home Living                      Prior Function            PT Goals (current goals can now be found in the care plan section) Acute Rehab PT Goals Patient Stated Goal: Regain IND, return to [facility] PT Goal Formulation: With patient Time For Goal Achievement: 08/30/20 Potential to Achieve Goals: Good Progress towards PT goals: Progressing toward goals    Frequency    Min 3X/week      PT Plan Current plan remains appropriate    Co-evaluation       OT goals addressed during session: ADL's and self-care (functional mobility)  AM-PAC PT "6 Clicks" Mobility   Outcome Measure  Help needed turning from your back to your side while in a flat bed without using bedrails?: A Lot Help needed moving from lying on your back to sitting on the side of a flat bed without using bedrails?: A Lot Help needed moving to and from a bed to a chair (including a wheelchair)?: A Little Help needed standing up from a chair using your arms (e.g., wheelchair or bedside chair)?: A Little Help needed to walk in hospital room?: A Lot Help needed climbing 3-5 steps with a railing? : A Lot 6 Click Score: 14    End of Session   Activity Tolerance: Patient tolerated treatment well Patient left: in bed;with call bell/phone within reach;with bed alarm set Nurse Communication: Mobility status PT Visit Diagnosis: Difficulty in walking, not  elsewhere classified (R26.2)     Time: 6195-0932 PT Time Calculation (min) (ACUTE ONLY): 23 min  Charges:  $Therapeutic Exercise: 23-37 mins                     Baxter Flattery, PT  Acute Rehab Dept ([place]) [phone] Pager [phone]  08/19/2020    [facility] 08/19/2020, 4:17 PM

## 2020-08-20 ENCOUNTER — Other Ambulatory Visit (HOSPITAL_COMMUNITY): Payer: Self-pay | Admitting: Psychiatry

## 2020-08-20 DIAGNOSIS — R55 Syncope and collapse: Secondary | ICD-10-CM | POA: Diagnosis not present

## 2020-08-20 DIAGNOSIS — S72001A Fracture of unspecified part of neck of right femur, initial encounter for closed fracture: Secondary | ICD-10-CM | POA: Diagnosis not present

## 2020-08-20 DIAGNOSIS — F332 Major depressive disorder, recurrent severe without psychotic features: Secondary | ICD-10-CM | POA: Diagnosis not present

## 2020-08-20 DIAGNOSIS — N179 Acute kidney failure, unspecified: Secondary | ICD-10-CM | POA: Diagnosis not present

## 2020-08-20 LAB — COMPREHENSIVE METABOLIC PANEL
ALT: 14 U/L (ref 0–44)
AST: 38 U/L (ref 15–41)
Albumin: 3 g/dL — ABNORMAL LOW (ref 3.5–5.0)
Alkaline Phosphatase: 61 U/L (ref 38–126)
Anion gap: 7 (ref 5–15)
BUN: 43 mg/dL — ABNORMAL HIGH (ref 8–23)
CO2: 26 mmol/L (ref 22–32)
Calcium: 10.4 mg/dL — ABNORMAL HIGH (ref 8.9–10.3)
Chloride: 104 mmol/L (ref 98–111)
Creatinine, Ser: 1.39 mg/dL — ABNORMAL HIGH (ref 0.61–1.24)
GFR, Estimated: 52 mL/min — ABNORMAL LOW (ref >60)
Glucose, Bld: 97 mg/dL (ref 70–99)
Potassium: 4.6 mmol/L (ref 3.5–5.1)
Sodium: 137 mmol/L (ref 135–145)
Total Bilirubin: 0.4 mg/dL (ref 0.3–1.2)
Total Protein: 6.1 g/dL — ABNORMAL LOW (ref 6.5–8.1)

## 2020-08-20 LAB — FOLATE: Folate: 5.5 ng/mL — ABNORMAL LOW (ref >5.9)

## 2020-08-20 LAB — IRON AND TIBC
Iron: 30 ug/dL — ABNORMAL LOW (ref 45–182)
Saturation Ratios: 13 % — ABNORMAL LOW (ref 17.9–39.5)
TIBC: 240 ug/dL — ABNORMAL LOW (ref 250–450)
UIBC: 210 ug/dL

## 2020-08-20 LAB — CBC WITH DIFFERENTIAL/PLATELET
Abs Immature Granulocytes: 0.05 10*3/uL (ref 0.00–0.07)
Basophils Absolute: 0 10*3/uL (ref 0.0–0.1)
Basophils Relative: 0 %
Eosinophils Absolute: 0.1 10*3/uL (ref 0.0–0.5)
Eosinophils Relative: 2 %
HCT: 23.9 % — ABNORMAL LOW (ref 39.0–52.0)
Hemoglobin: 7.6 g/dL — ABNORMAL LOW (ref 13.0–17.0)
Immature Granulocytes: 1 %
Lymphocytes Relative: 19 %
Lymphs Abs: 1 10*3/uL (ref 0.7–4.0)
MCH: 30.6 pg (ref 26.0–34.0)
MCHC: 31.8 g/dL (ref 30.0–36.0)
MCV: 96.4 fL (ref 80.0–100.0)
Monocytes Absolute: 0.6 10*3/uL (ref 0.1–1.0)
Monocytes Relative: 11 %
Neutro Abs: 3.4 10*3/uL (ref 1.7–7.7)
Neutrophils Relative %: 67 %
Platelets: 176 10*3/uL (ref 150–400)
RBC: 2.48 MIL/uL — ABNORMAL LOW (ref 4.22–5.81)
RDW: 12.6 % (ref 11.5–15.5)
WBC: 5.1 10*3/uL (ref 4.0–10.5)
nRBC: 0 % (ref 0.0–0.2)

## 2020-08-20 LAB — D-DIMER, QUANTITATIVE: D-Dimer, Quant: 3.58 ug/mL-FEU — ABNORMAL HIGH (ref 0.00–0.50)

## 2020-08-20 LAB — VITAMIN B12: Vitamin B-12: 128 pg/mL — ABNORMAL LOW (ref 180–914)

## 2020-08-20 LAB — RETICULOCYTES
Immature Retic Fract: 17.9 % — ABNORMAL HIGH (ref 2.3–15.9)
RBC.: 2.47 MIL/uL — ABNORMAL LOW (ref 4.22–5.81)
Retic Count, Absolute: 60.8 10*3/uL (ref 19.0–186.0)
Retic Ct Pct: 2.5 % (ref 0.4–3.1)

## 2020-08-20 LAB — FERRITIN: Ferritin: 135 ng/mL (ref 24–336)

## 2020-08-20 LAB — GLUCOSE, CAPILLARY: Glucose-Capillary: 90 mg/dL (ref 70–99)

## 2020-08-20 LAB — LACTATE DEHYDROGENASE: LDH: 122 U/L (ref 98–192)

## 2020-08-20 LAB — C-REACTIVE PROTEIN: CRP: 3.1 mg/dL — ABNORMAL HIGH (ref <1.0)

## 2020-08-20 MED ORDER — CYANOCOBALAMIN 1000 MCG/ML IJ SOLN
1000.0000 ug | Freq: Every day | INTRAMUSCULAR | Status: AC
  Administered 2020-08-20 – 2020-08-26 (×7): 1000 ug via INTRAMUSCULAR
  Filled 2020-08-20 (×7): qty 1

## 2020-08-20 MED ORDER — APIXABAN 5 MG PO TABS
5.0000 mg | ORAL_TABLET | Freq: Two times a day (BID) | ORAL | Status: DC
  Administered 2020-08-20 – 2020-08-26 (×13): 5 mg via ORAL
  Filled 2020-08-20 (×13): qty 1

## 2020-08-20 MED ORDER — FOLIC ACID 1 MG PO TABS
1.0000 mg | ORAL_TABLET | Freq: Every day | ORAL | Status: DC
  Administered 2020-08-20 – 2020-08-26 (×7): 1 mg via ORAL
  Filled 2020-08-20 (×7): qty 1

## 2020-08-20 MED ORDER — SODIUM CHLORIDE 0.9 % IV SOLN: INTRAVENOUS | Status: DC

## 2020-08-20 NOTE — Plan of Care (Signed)
  Problem: Activity: Goal: Ability to avoid complications of mobility impairment will improve Outcome: Progressing   Problem: Pain Management: Goal: Pain level will decrease with appropriate interventions Outcome: Progressing   Problem: Skin Integrity: Goal: Will show signs of wound healing Outcome: Progressing   Problem: Clinical Measurements: Goal: Will remain free from infection Outcome: Progressing   Problem: Coping: Goal: Level of anxiety will decrease Outcome: Progressing   Problem: Pain Managment: Goal: General experience of comfort will improve Outcome: Progressing   Problem: Skin Integrity: Goal: Risk for impaired skin integrity will decrease Outcome: Progressing

## 2020-08-20 NOTE — Progress Notes (Signed)
PROGRESS NOTE  Fernando Carr HYW:[id number] DOB: 09-07-1940   PCP: [name], MD  Patient is from: ALF  DOA: 08/13/2020 LOS: 7  Chief complaints: Syncopal fall  Brief Narrative / Interim history: 80 year old male with PMH of Parkinson disease, A. fib on Eliquis, CKD-3A, AAA status postrepair in 2015, HLD, bipolar disorder, syncope and severe LVH brought to ED from [facility] with possible syncope and right hip pain and found to have mildly displaced proximal right femoral neck fracture on x-ray.  Patient underwent right total hip arthroplasty on 08/14/2020.  Hospitalization complicated by postop acute blood loss anemia and AKI.  Therapy recommended SNF.  However, he tested positive for COVID-19 prior to discharge.  No respiratory symptoms.  Mildly elevated inflammatory markers.  Subjective: Seen and examined earlier this morning.  No major events overnight of this morning.  No complaints.  He denies chest pain, dyspnea, cough, GI or UTI symptoms.   Objective: Vitals:   08/19/20 2000 08/19/20 2010 08/20/20 0544 08/20/20 1449  BP:  105/70 (!) 146/69 (!) 82/53  Pulse:  (!) 57 64 100  Resp:  18 20 20   Temp:  97.9 F (36.6 C) 98.2 F (36.8 C) 98.2 F (36.8 C)  TempSrc:  Oral Oral Oral  SpO2: 96% 90% 92% 94%  Weight:   104.3 kg   Height:        Intake/Output Summary (Last 24 hours) at 08/20/2020 1525 Last data filed at 08/20/2020 0600 Gross per 24 hour  Intake 120 ml  Output 550 ml  Net -430 ml   Filed Weights   08/13/20 0956 08/14/20 1708 08/20/20 0544  Weight: 102.1 kg 102 kg 104.3 kg    Examination:  GENERAL: No apparent distress.  Nontoxic. HEENT: MMM.  Vision and hearing grossly intact.  NECK: Supple.  No apparent JVD.  RESP: On RA.  No IWOB.  Fair aeration bilaterally. CVS:  RRR. Heart sounds normal.  ABD/GI/GU: BS+. Abd soft, NTND.  MSK/EXT:  Moves extremities. No apparent deformity. No edema.  SKIN: no apparent skin lesion or wound NEURO: Awake,  alert and oriented appropriately.  No apparent focal neuro deficit. PSYCH: Calm. Normal affect.  Procedures:  1/14-right hip arthroplasty  Microbiology summarized: 1/13-influenza and COVID-19 PCR negative 1/18-COVID-19 PCR positive  Assessment & Plan: Proximal right femoral neck fracture status post fall -Status post right hip arthroplasty 1/14 -PT recommending SNF placement  Postoperative anemia from acute blood loss: Hgb 12.2 on admit>> 6.8 postop>2u> 7.5>> 7.6.   Vitamin S85 and folic acid deficiency-folic acid 5.5.  Vitamin B12 128.   -Anemia panel consistent with anemia of chronic disease.  Folate 5.5. -Vitamin B12 injection 1000 mcg daily for 1 week -P.o. folic acid 1 mg daily -Continue monitoring.  Syncope: Orthostatic hypotension?  Orthostatic vitals taken but incomplete. -Repeat orthostatic vitals in the morning  AKI on CKD stage IIIa/azotemia: Baseline Cr 1.45>> 2.36 (admit)>> 1.0> 1.39.  -Avoid nephrotoxic meds. -Continue monitoring -Start gentle IV fluid.  Mild hypercalcemia: Likely from dehydration in the setting of poor p.o. intake. -Gentle IV fluid as above  COVID-19 infection-incidental finding.  Tested positive for COVID-19 infection on 1/18 prior to discharge to SNF.  He was negative on admission.  Asymptomatic. Vaccinated and had booster two months.  Recent Labs    08/20/20 0514  DDIMER 3.58*  FERRITIN 135  LDH 122  CRP 3.1*  -Isolation precaution for 5 days -Already on Eliquis for A. fib. -Monitor inflammatory markers  Severe LVH, paroxysmal A. fib -Follows  with Dr. Percival Spanish -Continue Eliquis-increased dose to 5 mg twice daily.  Anxiety/depression/bipolar -Continue Zoloft, Risperdal  Parkinsonism -Continue Sinemet  Acute metabolic encephalopathy: In the setting of syncope, fall and surgery.  Seems to have resolved. -Reorientation and delirium precautions.   Body mass index is 31.19 kg/m.         DVT prophylaxis:  SCDs Start:  08/14/20 1720 apixaban (ELIQUIS) tablet 5 mg  Code Status: DNR/DNI Family Communication: Updated his sister, Arbie Cookey over the phone on 1/19 Status is: Inpatient  Remains inpatient appropriate because:Unsafe d/c plan and Inpatient level of care appropriate due to severity of illness   Dispo: The patient is from: ALF              Anticipated d/c is to: SNF              Anticipated d/c date is: > 3 days after isolation period for COVID-19              Patient currently is not medically stable to d/c.       Consultants:  Orthopedic surgery   Sch Meds:  Scheduled Meds: . apixaban  5 mg Oral BID  . carbidopa-levodopa  1 tablet Oral TID AC  . docusate sodium  100 mg Oral BID  . mouth rinse  15 mL Mouth Rinse BID  . risperiDONE  0.5 mg Oral QHS  . sertraline  50 mg Oral Daily   Continuous Infusions: PRN Meds:.HYDROcodone-acetaminophen, menthol-cetylpyridinium **OR** phenol, metoCLOPramide **OR** metoCLOPramide (REGLAN) injection, morphine injection, ondansetron **OR** ondansetron (ZOFRAN) IV, senna-docusate  Antimicrobials: Anti-infectives (From admission, onward)   Start     Dose/Rate Route Frequency Ordered Stop   08/15/20 0500  ceFAZolin (ANCEF) IVPB 2g/100 mL premix        2 g 200 mL/hr over 30 Minutes Intravenous  Once 08/15/20 0446 08/15/20 0528   08/14/20 1830  ceFAZolin (ANCEF) IVPB 2g/100 mL premix        2 g 200 mL/hr over 30 Minutes Intravenous Every 6 hours 08/14/20 1719 08/15/20 0229   08/14/20 1330  ceFAZolin (ANCEF) IVPB 2g/100 mL premix        2 g 200 mL/hr over 30 Minutes Intravenous On call to O.R. 08/14/20 1303 08/14/20 1311   08/14/20 1302  ceFAZolin (ANCEF) 2-4 GM/100ML-% IVPB       Note to Pharmacy: Bridget Hartshorn   : cabinet override      08/14/20 1302 08/14/20 1338       I have personally reviewed the following labs and images: CBC: Recent Labs  Lab 08/15/20 1909 08/16/20 0530 08/17/20 0643 08/18/20 0539 08/19/20 0447 08/20/20 0514  WBC  6.4 6.0 5.0 5.2 4.8 5.1  NEUTROABS 4.8  --   --   --   --  3.4  HGB 7.6* 7.4* 6.8* 7.5* 7.4* 7.6*  HCT 24.0* 22.6* 21.5* 24.1* 23.6* 23.9*  MCV 94.5 91.9 93.9 95.6 96.3 96.4  PLT 127* 106* 144* 155 173 176   BMP &GFR Recent Labs  Lab 08/16/20 0530 08/16/20 1105 08/17/20 0643 08/18/20 0539 08/19/20 0447 08/20/20 0514  NA 138  --  139 138 138 137  K 4.9  --  4.5 4.5 4.7 4.6  CL 110  --  107 106 107 104  CO2 16*  --  23 22 23 26   GLUCOSE 90  --  101* 103* 102* 97  BUN 40*  --  35* 37* 41* 43*  CREATININE 1.62*  --  1.38* 1.46* 1.00 1.39*  CALCIUM 9.6  --  10.0 10.0 10.4* 10.4*  MG 1.8  --  1.9  --   --   --   PHOS  --  1.9*  --   --   --   --    Estimated Creatinine Clearance: 53.8 mL/min (A) (by C-G formula based on SCr of 1.39 mg/dL (H)). Liver & Pancreas: Recent Labs  Lab 08/20/20 0514  AST 38  ALT 14  ALKPHOS 61  BILITOT 0.4  PROT 6.1*  ALBUMIN 3.0*   No results for input(s): LIPASE, AMYLASE in the last 168 hours. No results for input(s): AMMONIA in the last 168 hours. Diabetic: No results for input(s): HGBA1C in the last 72 hours. Recent Labs  Lab 08/16/20 1614 08/17/20 0515 08/18/20 0431 08/19/20 0640 08/20/20 0540  GLUCAP 104* 94 98 90 90   Cardiac Enzymes: No results for input(s): CKTOTAL, CKMB, CKMBINDEX, TROPONINI in the last 168 hours. No results for input(s): PROBNP in the last 8760 hours. Coagulation Profile: Recent Labs  Lab 08/15/20 1909  INR 1.2   Thyroid Function Tests: No results for input(s): TSH, T4TOTAL, FREET4, T3FREE, THYROIDAB in the last 72 hours. Lipid Profile: No results for input(s): CHOL, HDL, LDLCALC, TRIG, CHOLHDL, LDLDIRECT in the last 72 hours. Anemia Panel: Recent Labs    08/20/20 0514  VITAMINB12 128*  FOLATE 5.5*  FERRITIN 135  TIBC 240*  IRON 30*  RETICCTPCT 2.5   Urine analysis:    Component Value Date/Time   COLORURINE YELLOW 09/26/2019 1939   APPEARANCEUR CLEAR 09/26/2019 1939   LABSPEC 1.020  09/26/2019 1939   PHURINE 6.5 09/26/2019 1939   GLUCOSEU NEGATIVE 09/26/2019 1939   [place] NEGATIVE 09/26/2019 [place] 09/26/2019 1939   KETONESUR NEGATIVE 09/26/2019 1939   PROTEINUR 30 (A) 09/26/2019 1939   UROBILINOGEN 1.0 08/19/2014 1745   NITRITE NEGATIVE 09/26/2019 1939   LEUKOCYTESUR NEGATIVE 09/26/2019 1939   Sepsis Labs: Invalid input(s): PROCALCITONIN, [place]  Microbiology: Recent Results (from the past 240 hour(s))  Resp Panel by RT-PCR (Flu A&B, Covid) Nasopharyngeal Swab     Status: None   Collection Time: 08/13/20 11:58 AM   Specimen: Nasopharyngeal Swab; Nasopharyngeal(NP) swabs in vial transport medium  Result Value Ref Range Status   SARS Coronavirus 2 by RT PCR NEGATIVE NEGATIVE Final    Comment: (NOTE) SARS-CoV-2 target nucleic acids are NOT DETECTED.  The SARS-CoV-2 RNA is generally detectable in upper respiratory specimens during the acute phase of infection. The lowest concentration of SARS-CoV-2 viral copies this assay can detect is 138 copies/mL. A negative result does not preclude SARS-Cov-2 infection and should not be used as the sole basis for treatment or other patient management decisions. A negative result may occur with  improper specimen collection/handling, submission of specimen other than nasopharyngeal swab, presence of viral mutation(s) within the areas targeted by this assay, and inadequate number of viral copies(<138 copies/mL). A negative result must be combined with clinical observations, patient history, and epidemiological information. The expected result is Negative.  Fact Sheet for Patients:  EntrepreneurPulse.com.au  Fact Sheet for Healthcare Providers:  IncredibleEmployment.be  This test is no t yet approved or cleared by the [place] FDA and  has been authorized for detection and/or diagnosis of SARS-CoV-2 by FDA under an Emergency Use Authorization (EUA). This  EUA will remain  in effect (meaning this test can be used) for the duration of the COVID-19 declaration under Section 564(b)(1) of the Act, 21 U.S.C.section 360bbb-3(b)(1), unless the authorization is  terminated  or revoked sooner.       Influenza A by PCR NEGATIVE NEGATIVE Final   Influenza B by PCR NEGATIVE NEGATIVE Final    Comment: (NOTE) The Xpert Xpress SARS-CoV-2/FLU/RSV plus assay is intended as an aid in the diagnosis of influenza from Nasopharyngeal swab specimens and should not be used as a sole basis for treatment. Nasal washings and aspirates are unacceptable for Xpert Xpress SARS-CoV-2/FLU/RSV testing.  Fact Sheet for Patients: EntrepreneurPulse.com.au  Fact Sheet for Healthcare Providers: IncredibleEmployment.be  This test is not yet approved or cleared by the [place] FDA and has been authorized for detection and/or diagnosis of SARS-CoV-2 by FDA under an Emergency Use Authorization (EUA). This EUA will remain in effect (meaning this test can be used) for the duration of the COVID-19 declaration under Section 564(b)(1) of the Act, 21 U.S.C. section 360bbb-3(b)(1), unless the authorization is terminated or revoked.  Performed at [facility], [place] [address], [place], [place] [address]   SARS CORONAVIRUS 2 (TAT 6-24 HRS) Nasopharyngeal Nasopharyngeal Swab     Status: Abnormal   Collection Time: 08/18/20 11:55 AM   Specimen: Nasopharyngeal Swab  Result Value Ref Range Status   SARS Coronavirus 2 POSITIVE (A) NEGATIVE Final    Comment: (NOTE) SARS-CoV-2 target nucleic acids are DETECTED.  The SARS-CoV-2 RNA is generally detectable in upper and lower respiratory specimens during the acute phase of infection. Positive results are indicative of the presence of SARS-CoV-2 RNA. Clinical correlation with patient history and other diagnostic information is  necessary to determine patient infection status.  Positive results do not rule out bacterial infection or co-infection with other viruses.  The expected result is Negative.  Fact Sheet for Patients: SugarRoll.be  Fact Sheet for Healthcare Providers: https://www.woods-mathews.com/  This test is not yet approved or cleared by the [place] FDA and  has been authorized for detection and/or diagnosis of SARS-CoV-2 by FDA under an Emergency Use Authorization (EUA). This EUA will remain  in effect (meaning this test can be used) for the duration of the COVID-19 declaration under Section 564(b)(1) of the Act, 21 U. S.C. section 360bbb-3(b)(1), unless the authorization is terminated or revoked sooner.   Performed at [facility], [place] [address], [place], [place] [address]   Resp panel by RT-PCR (RSV, Flu A&B, Covid)     Status: Abnormal   Collection Time: 08/18/20 11:30 PM  Result Value Ref Range Status   SARS Coronavirus 2 by RT PCR POSITIVE (A) NEGATIVE Final    Comment: CRITICAL RESULT CALLED TO, READ BACK BY AND VERIFIED WITH: RN K CUMINGS AT 0056 08/19/20 CRU[place]ANK A (NOTE) SARS-CoV-2 target nucleic acids are DETECTED.  The SARS-CoV-2 RNA is generally detectable in upper respiratory specimens during the acute phase of infection. Positive results are indicative of the presence of the identified virus, but do not rule out bacterial infection or co-infection with other pathogens not detected by the test. Clinical correlation with patient history and other diagnostic information is necessary to determine patient infection status. The expected result is Negative.  Fact Sheet for Patients: EntrepreneurPulse.com.au  Fact Sheet for Healthcare Providers: IncredibleEmployment.be  This test is not yet approved or cleared by the [place] FDA and  has been authorized for detection and/or diagnosis of SARS-CoV-2 by FDA under an Emergency Use  Authorization (EUA).  This EUA will remain in effect (meanin g this test can be used) for the duration of  the COVID-19 declaration under Section 564(b)(1) of the  Act, 21 U.S.C. section 360bbb-3(b)(1), unless the authorization is terminated or revoked sooner.     Influenza A by PCR NEGATIVE NEGATIVE Final   Influenza B by PCR NEGATIVE NEGATIVE Final    Comment: (NOTE) The Xpert Xpress SARS-CoV-2/FLU/RSV plus assay is intended as an aid in the diagnosis of influenza from Nasopharyngeal swab specimens and should not be used as a sole basis for treatment. Nasal washings and aspirates are unacceptable for Xpert Xpress SARS-CoV-2/FLU/RSV testing.  Fact Sheet for Patients: EntrepreneurPulse.com.au  Fact Sheet for Healthcare Providers: IncredibleEmployment.be  This test is not yet approved or cleared by the [place] FDA and has been authorized for detection and/or diagnosis of SARS-CoV-2 by FDA under an Emergency Use Authorization (EUA). This EUA will remain in effect (meaning this test can be used) for the duration of the COVID-19 declaration under Section 564(b)(1) of the Act, 21 U.S.C. section 360bbb-3(b)(1), unless the authorization is terminated or revoked.     Resp Syncytial Virus by PCR NEGATIVE NEGATIVE Final    Comment: (NOTE) Fact Sheet for Patients: EntrepreneurPulse.com.au  Fact Sheet for Healthcare Providers: IncredibleEmployment.be  This test is not yet approved or cleared by the [place] FDA and has been authorized for detection and/or diagnosis of SARS-CoV-2 by FDA under an Emergency Use Authorization (EUA). This EUA will remain in effect (meaning this test can be used) for the duration of the COVID-19 declaration under Section 564(b)(1) of the Act, 21 U.S.C. section 360bbb-3(b)(1), unless the authorization is terminated or revoked.  Performed at [facility], [place] [address], [place], [place] [address]     Radiology Studies: No results found.    Taye T. [place]  If 7PM-7AM, please contact night-coverage [email or web address] 08/20/2020, 3:25 PM

## 2020-08-20 NOTE — Progress Notes (Signed)
Physical Therapy Treatment Patient Details Name: Fernando Carr MRN: [id number] DOB: [birth year] Today's Date: 08/20/2020    History of Present Illness Pt admitted from [facility] s/p fall with R hip fx and now s/p R THR by anterior direct approach.  Pt with hx of CKD, Parkinsons, bipolar, AAA, and A-fib    PT Comments    Pt very cooperative and with good progress with mobility this date including increased activity tolerance and distance ambulated as well as decreasing level of assist required for all tasks.   Follow Up Recommendations  SNF     Equipment Recommendations  Rolling walker with 5" wheels    Recommendations for Other Services       Precautions / Restrictions Precautions Precautions: Fall Restrictions Weight Bearing Restrictions: No RLE Weight Bearing: Weight bearing as tolerated    Mobility  Bed Mobility Overal bed mobility: Needs Assistance Bed Mobility: Supine to Sit     Supine to sit: Min assist;Mod assist     General bed mobility comments: Increased time with cues for sequence and use of L LE to self assist.  Physical assist to manage R LE and to bring trunk to uprightq  Transfers Overall transfer level: Needs assistance Equipment used: Rolling walker (2 wheeled) Transfers: Sit to/from Stand Sit to Stand: Min guard;From elevated surface         General transfer comment: Steady assist with cues for LE management and use of UEs to self assistq  Ambulation/Gait Ambulation/Gait assistance: Min assist;+2 safety/equipment Gait Distance (Feet): 17 Feet Assistive device: Rolling walker (2 wheeled) Gait Pattern/deviations: Step-to pattern;Decreased step length - right;Decreased step length - left;Shuffle;Trunk flexed Gait velocity: decr   General Gait Details: Short shuffling step with cues for sequence, posture and position from RW.  Chair follow for safety   Stairs             Wheelchair Mobility    Modified Rankin (Stroke Patients  Only)       Balance Overall balance assessment: Needs assistance Sitting-balance support: Feet supported Sitting balance-Leahy Scale: Good     Standing balance support: Bilateral upper extremity supported Standing balance-Leahy Scale: Poor                              Cognition Arousal/Alertness: Awake/alert Behavior During Therapy: WFL for tasks assessed/performed Overall Cognitive Status: Within Functional Limits for tasks assessed                                        Exercises Total Joint Exercises Ankle Circles/Pumps: AROM;Both;15 reps;Supine Quad Sets: AROM;Both;10 reps;Supine Heel Slides: AAROM;Right;20 reps;Supine Hip ABduction/ADduction: AAROM;Right;Supine;20 reps    General Comments        Pertinent Vitals/Pain Pain Assessment: No/denies pain    Home Living                      Prior Function            PT Goals (current goals can now be found in the care plan section) Acute Rehab PT Goals Patient Stated Goal: Regain IND, return to [facility] PT Goal Formulation: With patient Time For Goal Achievement: 08/30/20 Potential to Achieve Goals: Good Progress towards PT goals: Progressing toward goals    Frequency    Min 3X/week      PT Plan Current plan remains  appropriate    Co-evaluation              AM-PAC PT "6 Clicks" Mobility   Outcome Measure  Help needed turning from your back to your side while in a flat bed without using bedrails?: A Lot Help needed moving from lying on your back to sitting on the side of a flat bed without using bedrails?: A Little Help needed moving to and from a bed to a chair (including a wheelchair)?: A Little Help needed standing up from a chair using your arms (e.g., wheelchair or bedside chair)?: A Little Help needed to walk in hospital room?: A Little Help needed climbing 3-5 steps with a railing? : A Lot 6 Click Score: 16    End of Session Equipment  Utilized During Treatment: Gait belt Activity Tolerance: Patient tolerated treatment well Patient left: in chair;with call bell/phone within reach;with chair alarm set Nurse Communication: Mobility status PT Visit Diagnosis: Difficulty in walking, not elsewhere classified (R26.2)     Time: 1610-9604 PT Time Calculation (min) (ACUTE ONLY): 31 min  Charges:  $Gait Training: 8-22 mins $Therapeutic Exercise: 8-22 mins                     [place] Pager [phone] Office [phone]    Gabrielle Wakeland 08/20/2020, 4:17 PM

## 2020-08-21 DIAGNOSIS — N1831 Chronic kidney disease, stage 3a: Secondary | ICD-10-CM | POA: Diagnosis not present

## 2020-08-21 DIAGNOSIS — S72001A Fracture of unspecified part of neck of right femur, initial encounter for closed fracture: Secondary | ICD-10-CM | POA: Diagnosis not present

## 2020-08-21 DIAGNOSIS — N179 Acute kidney failure, unspecified: Secondary | ICD-10-CM | POA: Diagnosis not present

## 2020-08-21 DIAGNOSIS — I517 Cardiomegaly: Secondary | ICD-10-CM | POA: Diagnosis not present

## 2020-08-21 LAB — HEMOGLOBIN AND HEMATOCRIT, BLOOD
HCT: 24.2 % — ABNORMAL LOW (ref 39.0–52.0)
Hemoglobin: 7.4 g/dL — ABNORMAL LOW (ref 13.0–17.0)

## 2020-08-21 LAB — RENAL FUNCTION PANEL
Albumin: 3 g/dL — ABNORMAL LOW (ref 3.5–5.0)
Anion gap: 9 (ref 5–15)
BUN: 42 mg/dL — ABNORMAL HIGH (ref 8–23)
CO2: 22 mmol/L (ref 22–32)
Calcium: 10.2 mg/dL (ref 8.9–10.3)
Chloride: 106 mmol/L (ref 98–111)
Creatinine, Ser: 1.43 mg/dL — ABNORMAL HIGH (ref 0.61–1.24)
GFR, Estimated: 50 mL/min — ABNORMAL LOW (ref >60)
Glucose, Bld: 103 mg/dL — ABNORMAL HIGH (ref 70–99)
Phosphorus: 2.9 mg/dL (ref 2.5–4.6)
Potassium: 4.5 mmol/L (ref 3.5–5.1)
Sodium: 137 mmol/L (ref 135–145)

## 2020-08-21 LAB — C-REACTIVE PROTEIN: CRP: 2.8 mg/dL — ABNORMAL HIGH (ref <1.0)

## 2020-08-21 LAB — GLUCOSE, CAPILLARY: Glucose-Capillary: 91 mg/dL (ref 70–99)

## 2020-08-21 LAB — MAGNESIUM: Magnesium: 2 mg/dL (ref 1.7–2.4)

## 2020-08-21 LAB — D-DIMER, QUANTITATIVE: D-Dimer, Quant: 4.23 ug/mL-FEU — ABNORMAL HIGH (ref 0.00–0.50)

## 2020-08-21 NOTE — Plan of Care (Signed)
  Problem: Activity: Goal: Ability to avoid complications of mobility impairment will improve Outcome: Progressing   Problem: Pain Management: Goal: Pain level will decrease with appropriate interventions Outcome: Progressing   Problem: Skin Integrity: Goal: Will show signs of wound healing Outcome: Progressing   Problem: Education: Goal: Knowledge of General Education information will improve Description: Including pain rating scale, medication(s)/side effects and non-pharmacologic comfort measures Outcome: Progressing   

## 2020-08-21 NOTE — Progress Notes (Signed)
PROGRESS NOTE  Fernando Carr  QMV:[id number] DOB: [birth year] DOA: 08/13/2020 PCP: [name], MD   Brief Narrative: Fernando Carr is a 80 y.o. male with a history of AFib on eliquis, stage IIIa CKD, PD, AAA s/p repair, severe LVH, HLD, and bipolar disorder who presented on 1/13 from [facility] with right hip pain found to have a displaced right proximal femur fracture and subsequently had THA 1/14. Postoperative course included anemia requiring 1u PRBCs each on 1/15 and 1/17. Plan was for SNF discharge, though screening SARS-CoV-2 PCR was positive. Pt is triple vaccinated and tested negative on admission.   Assessment & Plan: Principal Problem:   Hip fracture ([place]) Active Problems:   CRD (chronic renal disease), stage III (HCC)   MDD (major depressive disorder), recurrent severe, without psychosis ([place])   Syncope   AKI (acute kidney injury) ([place])   LVH (left ventricular hypertrophy)  Proximal right femur fracture s/p right THA 08/14/2020:  - Pain control - Orthopedics follow up, WBAT.  - Continue PT/OT at SNF - On anticoagulation as below  Breakthrough covid-19 infection: + 1/18 on screening test. No symptoms in pt vaccinated x3.  - No current Tx indicated.  - If remains asymptomatic, isolation period would be 5 days  Acute postoperative blood loss anemia on anemia of chronic disease: s/p 1u PRBCs total. - Monitor CBC, hgb stable, no gross bleeding noted.   AKI on stage IIIa CKD: Improved.  - Avoid nephrotoxins  PAF, LVH:  - Continue eliquis - Continue cardiology follow up.   Bipolar disorder: Quiescent.  - Continue risperidone, SSRI  PD:  - Continue sinemet  Acute metabolic encephalopathy: Resolved.   Vitamin B12 deficiency:  - Supplement  Folic acid deficiency:  - Supplement  Fall, possible syncope:  - Continue telemetry  Obesity: Estimated body mass index is 31.19 kg/m as calculated from the following:   Height as of this encounter: 6' (1.829 m).    Weight as of this encounter: 104.3 kg.  DVT prophylaxis: Eliquis Code Status: DNR Family Communication: None at bedside Disposition Plan:  Status is: Inpatient  Remains inpatient appropriate because:Unsafe d/c plan  Dispo: The patient is from: ILF              Anticipated d/c is to: SNF              Anticipated d/c date is: 1 day              Patient currently is medically stable to d/c.  Consultants:   Orthopedics  Procedures:   Right THA 08/14/2020  Antimicrobials:  None   Subjective: Pain controlled, right leg a bit sore. Working with PT. Denies chest pain, dyspnea, or bleeding.  Objective: Vitals:   08/20/20 1845 08/20/20 2104 08/21/20 0421 08/21/20 1422  BP:  139/68 124/72 117/72  Pulse:  63 64 (!) 55  Resp:  20 18 17   Temp:  97.9 F (36.6 C) 98.1 F (36.7 C) 97.9 F (36.6 C)  TempSrc:  Oral Oral Oral  SpO2: 94% 91% 94%   Weight:      Height:        Intake/Output Summary (Last 24 hours) at 08/21/2020 1428 Last data filed at 08/21/2020 1041 Gross per 24 hour  Intake 1167.02 ml  Output 2650 ml  Net -1482.98 ml   Filed Weights   08/13/20 0956 08/14/20 1708 08/20/20 0544  Weight: 102.1 kg 102 kg 104.3 kg    Gen: 80 y.o. male in no  distress  Pulm: Non-labored breathing room air. Clear to auscultation bilaterally.  CV: Regular rate and rhythm. No murmur, rub, or gallop. No JVD, no pedal edema. GI: Abdomen soft, non-tender, non-distended, with normoactive bowel sounds. No organomegaly or masses felt. Ext: Warm, no deformities Skin: No rashes, lesions or ulcers on visualized skin. No exudate or cellulitis noted. Neuro: Alert and oriented. No focal neurological deficits. Psych: Judgement and insight appear normal. Mood & affect appropriate.   Data Reviewed: I have personally reviewed following labs and imaging studies  CBC: Recent Labs  Lab 08/15/20 1909 08/16/20 0530 08/17/20 0643 08/18/20 0539 08/19/20 0447 08/20/20 0514 08/21/20 0456  WBC 6.4  6.0 5.0 5.2 4.8 5.1  --   NEUTROABS 4.8  --   --   --   --  3.4  --   HGB 7.6* 7.4* 6.8* 7.5* 7.4* 7.6* 7.4*  HCT 24.0* 22.6* 21.5* 24.1* 23.6* 23.9* 24.2*  MCV 94.5 91.9 93.9 95.6 96.3 96.4  --   PLT 127* 106* 144* 155 173 176  --    Basic Metabolic Panel: Recent Labs  Lab 08/16/20 0530 08/16/20 1105 08/17/20 0643 08/18/20 0539 08/19/20 0447 08/20/20 0514 08/21/20 0456  NA 138  --  139 138 138 137 137  K 4.9  --  4.5 4.5 4.7 4.6 4.5  CL 110  --  107 106 107 104 106  CO2 16*  --  23 22 23 26 22   GLUCOSE 90  --  101* 103* 102* 97 103*  BUN 40*  --  35* 37* 41* 43* 42*  CREATININE 1.62*  --  1.38* 1.46* 1.00 1.39* 1.43*  CALCIUM 9.6  --  10.0 10.0 10.4* 10.4* 10.2  MG 1.8  --  1.9  --   --   --  2.0  PHOS  --  1.9*  --   --   --   --  2.9   GFR: Estimated Creatinine Clearance: 52.3 mL/min (A) (by C-G formula based on SCr of 1.43 mg/dL (H)). Liver Function Tests: Recent Labs  Lab 08/20/20 0514 08/21/20 0456  AST 38  --   ALT 14  --   ALKPHOS 61  --   BILITOT 0.4  --   PROT 6.1*  --   ALBUMIN 3.0* 3.0*   No results for input(s): LIPASE, AMYLASE in the last 168 hours. No results for input(s): AMMONIA in the last 168 hours. Coagulation Profile: Recent Labs  Lab 08/15/20 1909  INR 1.2   Cardiac Enzymes: No results for input(s): CKTOTAL, CKMB, CKMBINDEX, TROPONINI in the last 168 hours. BNP (last 3 results) No results for input(s): PROBNP in the last 8760 hours. HbA1C: No results for input(s): HGBA1C in the last 72 hours. CBG: Recent Labs  Lab 08/17/20 0515 08/18/20 0431 08/19/20 0640 08/20/20 0540 08/21/20 0612  GLUCAP 94 98 90 90 91   Lipid Profile: No results for input(s): CHOL, HDL, LDLCALC, TRIG, CHOLHDL, LDLDIRECT in the last 72 hours. Thyroid Function Tests: No results for input(s): TSH, T4TOTAL, FREET4, T3FREE, THYROIDAB in the last 72 hours. Anemia Panel: Recent Labs    08/20/20 0514  VITAMINB12 128*  FOLATE 5.5*  FERRITIN 135  TIBC 240*   IRON 30*  RETICCTPCT 2.5   Urine analysis:    Component Value Date/Time   COLORURINE YELLOW 09/26/2019 1939   APPEARANCEUR CLEAR 09/26/2019 1939   LABSPEC 1.020 09/26/2019 1939   PHURINE 6.5 09/26/2019 1939   GLUCOSEU NEGATIVE 09/26/2019 [name] NEGATIVE 09/26/2019 1939  BILIRUBINUR NEGATIVE 09/26/2019 1939   KETONESUR NEGATIVE 09/26/2019 1939   PROTEINUR 30 (A) 09/26/2019 1939   UROBILINOGEN 1.0 08/19/2014 1745   NITRITE NEGATIVE 09/26/2019 1939   LEUKOCYTESUR NEGATIVE 09/26/2019 1939   Recent Results (from the past 240 hour(s))  Resp Panel by RT-PCR (Flu A&B, Covid) Nasopharyngeal Swab     Status: None   Collection Time: 08/13/20 11:58 AM   Specimen: Nasopharyngeal Swab; Nasopharyngeal(NP) swabs in vial transport medium  Result Value Ref Range Status   SARS Coronavirus 2 by RT PCR NEGATIVE NEGATIVE Final    Comment: (NOTE) SARS-CoV-2 target nucleic acids are NOT DETECTED.  The SARS-CoV-2 RNA is generally detectable in upper respiratory specimens during the acute phase of infection. The lowest concentration of SARS-CoV-2 viral copies this assay can detect is 138 copies/mL. A negative result does not preclude SARS-Cov-2 infection and should not be used as the sole basis for treatment or other patient management decisions. A negative result may occur with  improper specimen collection/handling, submission of specimen other than nasopharyngeal swab, presence of viral mutation(s) within the areas targeted by this assay, and inadequate number of viral copies(<138 copies/mL). A negative result must be combined with clinical observations, patient history, and epidemiological information. The expected result is Negative.  Fact Sheet for Patients:  EntrepreneurPulse.com.au  Fact Sheet for Healthcare Providers:  IncredibleEmployment.be  This test is no t yet approved or cleared by the [place] FDA and  has been authorized for  detection and/or diagnosis of SARS-CoV-2 by FDA under an Emergency Use Authorization (EUA). This EUA will remain  in effect (meaning this test can be used) for the duration of the COVID-19 declaration under Section 564(b)(1) of the Act, 21 U.S.C.section 360bbb-3(b)(1), unless the authorization is terminated  or revoked sooner.       Influenza A by PCR NEGATIVE NEGATIVE Final   Influenza B by PCR NEGATIVE NEGATIVE Final    Comment: (NOTE) The Xpert Xpress SARS-CoV-2/FLU/RSV plus assay is intended as an aid in the diagnosis of influenza from Nasopharyngeal swab specimens and should not be used as a sole basis for treatment. Nasal washings and aspirates are unacceptable for Xpert Xpress SARS-CoV-2/FLU/RSV testing.  Fact Sheet for Patients: EntrepreneurPulse.com.au  Fact Sheet for Healthcare Providers: IncredibleEmployment.be  This test is not yet approved or cleared by the [place] FDA and has been authorized for detection and/or diagnosis of SARS-CoV-2 by FDA under an Emergency Use Authorization (EUA). This EUA will remain in effect (meaning this test can be used) for the duration of the COVID-19 declaration under Section 564(b)(1) of the Act, 21 U.S.C. section 360bbb-3(b)(1), unless the authorization is terminated or revoked.  Performed at [facility], [place] [address], [place], [place] [address]   SARS CORONAVIRUS 2 (TAT 6-24 HRS) Nasopharyngeal Nasopharyngeal Swab     Status: Abnormal   Collection Time: 08/18/20 11:55 AM   Specimen: Nasopharyngeal Swab  Result Value Ref Range Status   SARS Coronavirus 2 POSITIVE (A) NEGATIVE Final    Comment: (NOTE) SARS-CoV-2 target nucleic acids are DETECTED.  The SARS-CoV-2 RNA is generally detectable in upper and lower respiratory specimens during the acute phase of infection. Positive results are indicative of the presence of SARS-CoV-2 RNA. Clinical correlation with patient  history and other diagnostic information is  necessary to determine patient infection status. Positive results do not rule out bacterial infection or co-infection with other viruses.  The expected result is Negative.  Fact Sheet for Patients: SugarRoll.be  Fact Sheet for Healthcare  Providers: https://www.woods-mathews.com/  This test is not yet approved or cleared by the [place] and  has been authorized for detection and/or diagnosis of SARS-CoV-2 by FDA under an Emergency Use Authorization (EUA). This EUA will remain  in effect (meaning this test can be used) for the duration of the COVID-19 declaration under Section 564(b)(1) of the Act, 21 U. S.C. section 360bbb-3(b)(1), unless the authorization is terminated or revoked sooner.   Performed at [facility], [place] [address] [place], [place] [address]   Resp panel by RT-PCR (RSV, Flu A&B, Covid)     Status: Abnormal   Collection Time: 08/18/20 11:30 PM  Result Value Ref Range Status   SARS Coronavirus 2 by RT PCR POSITIVE (A) NEGATIVE Final    Comment: CRITICAL RESULT CALLED TO, READ BACK BY AND VERIFIED WITH: RN K CUMINGS AT 0056 08/19/20 CRU[place]ANK A (NOTE) SARS-CoV-2 target nucleic acids are DETECTED.  The SARS-CoV-2 RNA is generally detectable in upper respiratory specimens during the acute phase of infection. Positive results are indicative of the presence of the identified virus, but do not rule out bacterial infection or co-infection with other pathogens not detected by the test. Clinical correlation with patient history and other diagnostic information is necessary to determine patient infection status. The expected result is Negative.  Fact Sheet for Patients: EntrepreneurPulse.com.au  Fact Sheet for Healthcare Providers: IncredibleEmployment.be  This test is not yet approved or cleared by the [place] FDA and  has  been authorized for detection and/or diagnosis of SARS-CoV-2 by FDA under an Emergency Use Authorization (EUA).  This EUA will remain in effect (meanin g this test can be used) for the duration of  the COVID-19 declaration under Section 564(b)(1) of the Act, 21 U.S.C. section 360bbb-3(b)(1), unless the authorization is terminated or revoked sooner.     Influenza A by PCR NEGATIVE NEGATIVE Final   Influenza B by PCR NEGATIVE NEGATIVE Final    Comment: (NOTE) The Xpert Xpress SARS-CoV-2/FLU/RSV plus assay is intended as an aid in the diagnosis of influenza from Nasopharyngeal swab specimens and should not be used as a sole basis for treatment. Nasal washings and aspirates are unacceptable for Xpert Xpress SARS-CoV-2/FLU/RSV testing.  Fact Sheet for Patients: EntrepreneurPulse.com.au  Fact Sheet for Healthcare Providers: IncredibleEmployment.be  This test is not yet approved or cleared by the [place] FDA and has been authorized for detection and/or diagnosis of SARS-CoV-2 by FDA under an Emergency Use Authorization (EUA). This EUA will remain in effect (meaning this test can be used) for the duration of the COVID-19 declaration under Section 564(b)(1) of the Act, 21 U.S.C. section 360bbb-3(b)(1), unless the authorization is terminated or revoked.     Resp Syncytial Virus by PCR NEGATIVE NEGATIVE Final    Comment: (NOTE) Fact Sheet for Patients: EntrepreneurPulse.com.au  Fact Sheet for Healthcare Providers: IncredibleEmployment.be  This test is not yet approved or cleared by the [place] FDA and has been authorized for detection and/or diagnosis of SARS-CoV-2 by FDA under an Emergency Use Authorization (EUA). This EUA will remain in effect (meaning this test can be used) for the duration of the COVID-19 declaration under Section 564(b)(1) of the Act, 21 U.S.C. section 360bbb-3(b)(1), unless the  authorization is terminated or revoked.  Performed at [facility], [place] [address] [place], [place] [address]       Radiology Studies: No results found.  Scheduled Meds: . apixaban  5 mg Oral BID  . carbidopa-levodopa  1 tablet Oral  TID AC  . cyanocobalamin  1,000 mcg Intramuscular Daily  . docusate sodium  100 mg Oral BID  . folic acid  1 mg Oral Daily  . mouth rinse  15 mL Mouth Rinse BID  . risperiDONE  0.5 mg Oral QHS  . sertraline  50 mg Oral Daily   Continuous Infusions: . sodium chloride 60 mL/hr at 08/20/20 1830     LOS: 8 days   Time spent: 25 minutes.  [name], MD [facility] [email or web address] 08/21/2020, 2:28 PM

## 2020-08-21 NOTE — Care Management Important Message (Signed)
Important Message  Patient Details IM Letter placed in Patient's door [place]. Name: Fernando Carr MRN: [id number] Date of Birth: [birth year]   Medicare Important Message Given:  Yes     Kerin Salen 08/21/2020, 10:45 AM

## 2020-08-22 DIAGNOSIS — S72001A Fracture of unspecified part of neck of right femur, initial encounter for closed fracture: Secondary | ICD-10-CM | POA: Diagnosis not present

## 2020-08-22 DIAGNOSIS — N179 Acute kidney failure, unspecified: Secondary | ICD-10-CM | POA: Diagnosis not present

## 2020-08-22 DIAGNOSIS — N1831 Chronic kidney disease, stage 3a: Secondary | ICD-10-CM | POA: Diagnosis not present

## 2020-08-22 DIAGNOSIS — I517 Cardiomegaly: Secondary | ICD-10-CM | POA: Diagnosis not present

## 2020-08-22 LAB — GLUCOSE, CAPILLARY: Glucose-Capillary: 77 mg/dL (ref 70–99)

## 2020-08-22 MED ORDER — POLYETHYLENE GLYCOL 3350 17 G PO PACK
17.0000 g | PACK | Freq: Every day | ORAL | Status: DC
  Administered 2020-08-26: 17 g via ORAL
  Filled 2020-08-22: qty 1

## 2020-08-22 MED ORDER — SENNOSIDES-DOCUSATE SODIUM 8.6-50 MG PO TABS
2.0000 | ORAL_TABLET | Freq: Two times a day (BID) | ORAL | Status: DC
  Administered 2020-08-22 – 2020-08-26 (×6): 2 via ORAL
  Filled 2020-08-22 (×6): qty 2

## 2020-08-22 MED ORDER — BISACODYL 10 MG RE SUPP: 10.0000 mg | Freq: Every day | RECTAL | Status: DC | PRN

## 2020-08-22 NOTE — Progress Notes (Signed)
PROGRESS NOTE  Fernando Carr  FTD:[id number] DOB: 30-Jan-1941 DOA: 08/13/2020 PCP: [name], MD   Brief Narrative: Fernando Carr is a 80 y.o. male with a history of AFib on eliquis, stage IIIa CKD, PD, AAA s/p repair, severe LVH, HLD, and bipolar disorder who presented on 1/13 from [place] with right hip pain found to have a displaced right proximal femur fracture and subsequently had THA 1/14. Postoperative course included anemia requiring 1u PRBCs each on 1/15 and 1/17. Plan was for SNF discharge, though screening SARS-CoV-2 PCR was positive. Pt is triple vaccinated and tested negative on admission.   Assessment & Plan: Principal Problem:   Hip fracture ([place]) Active Problems:   CRD (chronic renal disease), stage III (H[facility])   MDD (major depressive disorder), recurrent severe, without psychosis ([place])   Syncope   AKI (acute kidney injury) ([place])   LVH (left ventricular hypertrophy)  Proximal right femur fracture s/p right THA 08/14/2020:  - Pain control - Orthopedics follow up, WBAT.  - Continue PT/OT at SNF. OOB daily ordered. - On anticoagulation as below  Constipation:  - Add bowel regimen scheduled with prn suppository. If no results, may need enema.   Breakthrough covid-19 infection: + 1/18 on screening test. No symptoms in pt vaccinated x3.  - No current Tx indicated.  - If remains asymptomatic, isolation period would be 5 days (last day of isolation 1/23)  Acute postoperative blood loss anemia on anemia of chronic disease: s/p 1u PRBCs total. - Monitor H/H in AM, hgb stable, no gross bleeding noted.   AKI on stage IIIa CKD: Improved.  - Avoid nephrotoxins  PAF, LVH:  - Continue eliquis - Continue cardiology follow up.   Bipolar disorder: Quiescent.  - Continue risperidone, SSRI  PD:  - Continue sinemet  Acute metabolic encephalopathy: Resolved.   Vitamin B12 deficiency:  - Supplement  Folic acid deficiency:  - Supplement  Fall, possible syncope:   - Continue telemetry  Obesity: Estimated body mass index is 31.19 kg/m as calculated from the following:   Height as of this encounter: 6' (1.829 m).   Weight as of this encounter: 104.3 kg.  DVT prophylaxis: Eliquis Code Status: DNR Family Communication: None at bedside Disposition Plan:  Status is: Inpatient  Remains inpatient appropriate because:Unsafe d/c plan  Dispo: The patient is from: ILF              Anticipated d/c is to: SNF              Anticipated d/c date is: 2 days              Patient currently is medically stable to d/c.  Consultants:   Orthopedics  Procedures:   Right THA 08/14/2020  Antimicrobials:  None   Subjective: No new complaints but confirms no BM "since I've been here" which is unusual. No abdominal pain or urge. No N/V. Denies dyspnea or severe pain at the surgical site.  Objective: Vitals:   08/21/20 0421 08/21/20 1422 08/21/20 2141 08/22/20 0538  BP: 124/72 117/72 (!) 146/91 (!) 166/78  Pulse: 64 (!) 55 70 68  Resp: 18 17 18 16   Temp: 98.1 F (36.7 C) 97.9 F (36.6 C) 97.8 F (36.6 C) 97.8 F (36.6 C)  TempSrc: Oral Oral Oral Oral  SpO2: 94%  96%   Weight:      Height:        Intake/Output Summary (Last 24 hours) at 08/22/2020 1225 Last data filed at  08/22/2020 1107 Gross per 24 hour  Intake 1818.6 ml  Output 2200 ml  Net -381.4 ml   Filed Weights   08/13/20 0956 08/14/20 1708 08/20/20 0544  Weight: 102.1 kg 102 kg 104.3 kg   Gen: 80 y.o. male in no distress Pulm: Nonlabored breathing room air. Clear. CV: Regular rate and rhythm. No murmur, rub, or gallop. No JVD, no dependent edema. GI: Abdomen soft, non-tender, non-distended, with normoactive bowel sounds.  Ext: Warm, no deformities. No significant tenderness, though some localized edema to right lateral thigh/hip. Dressings are c/d/i. Skin: No new rashes, lesions or ulcers on visualized skin. Neuro: Alert and oriented. No focal neurological deficits. Psych:  Judgement and insight appear fair. Mood euthymic & affect congruent. Behavior is appropriate.    Data Reviewed: I have personally reviewed following labs and imaging studies  CBC: Recent Labs  Lab 08/15/20 1909 08/16/20 0530 08/17/20 0643 08/18/20 0539 08/19/20 0447 08/20/20 0514 08/21/20 0456  WBC 6.4 6.0 5.0 5.2 4.8 5.1  --   NEUTROABS 4.8  --   --   --   --  3.4  --   HGB 7.6* 7.4* 6.8* 7.5* 7.4* 7.6* 7.4*  HCT 24.0* 22.6* 21.5* 24.1* 23.6* 23.9* 24.2*  MCV 94.5 91.9 93.9 95.6 96.3 96.4  --   PLT 127* 106* 144* 155 173 176  --    Basic Metabolic Panel: Recent Labs  Lab 08/16/20 0530 08/16/20 1105 08/17/20 0643 08/18/20 0539 08/19/20 0447 08/20/20 0514 08/21/20 0456  NA 138  --  139 138 138 137 137  K 4.9  --  4.5 4.5 4.7 4.6 4.5  CL 110  --  107 106 107 104 106  CO2 16*  --  23 22 23 26 22   GLUCOSE 90  --  101* 103* 102* 97 103*  BUN 40*  --  35* 37* 41* 43* 42*  CREATININE 1.62*  --  1.38* 1.46* 1.00 1.39* 1.43*  CALCIUM 9.6  --  10.0 10.0 10.4* 10.4* 10.2  MG 1.8  --  1.9  --   --   --  2.0  PHOS  --  1.9*  --   --   --   --  2.9   GFR: Estimated Creatinine Clearance: 52.3 mL/min (A) (by C-G formula based on SCr of 1.43 mg/dL (H)). Liver Function Tests: Recent Labs  Lab 08/20/20 0514 08/21/20 0456  AST 38  --   ALT 14  --   ALKPHOS 61  --   BILITOT 0.4  --   PROT 6.1*  --   ALBUMIN 3.0* 3.0*   No results for input(s): LIPASE, AMYLASE in the last 168 hours. No results for input(s): AMMONIA in the last 168 hours. Coagulation Profile: Recent Labs  Lab 08/15/20 1909  INR 1.2   Cardiac Enzymes: No results for input(s): CKTOTAL, CKMB, CKMBINDEX, TROPONINI in the last 168 hours. BNP (last 3 results) No results for input(s): PROBNP in the last 8760 hours. HbA1C: No results for input(s): HGBA1C in the last 72 hours. CBG: Recent Labs  Lab 08/18/20 0431 08/19/20 0640 08/20/20 0540 08/21/20 0612 08/22/20 0519  GLUCAP 98 90 90 91 77   Lipid  Profile: No results for input(s): CHOL, HDL, LDLCALC, TRIG, CHOLHDL, LDLDIRECT in the last 72 hours. Thyroid Function Tests: No results for input(s): TSH, T4TOTAL, FREET4, T3FREE, THYROIDAB in the last 72 hours. Anemia Panel: Recent Labs    08/20/20 0514  VITAMINB12 128*  FOLATE 5.5*  FERRITIN 135  TIBC 240*  IRON 30*  RETICCTPCT 2.5   Urine analysis:    Component Value Date/Time   COLORURINE YELLOW 09/26/2019 1939   APPEARANCEUR CLEAR 09/26/2019 1939   LABSPEC 1.020 09/26/2019 1939   PHURINE 6.5 09/26/2019 1939   GLUCOSEU NEGATIVE 09/26/2019 1939   HGBUR NEGATIVE 09/26/2019 [place] 09/26/2019 1939   KETONESUR NEGATIVE 09/26/2019 1939   PROTEINUR 30 (A) 09/26/2019 1939   UROBILINOGEN 1.0 08/19/2014 1745   NITRITE NEGATIVE 09/26/2019 1939   LEUKOCYTESUR NEGATIVE 09/26/2019 1939   Recent Results (from the past 240 hour(s))  Resp Panel by RT-PCR (Flu A&B, Covid) Nasopharyngeal Swab     Status: None   Collection Time: 08/13/20 11:58 AM   Specimen: Nasopharyngeal Swab; Nasopharyngeal(NP) swabs in vial transport medium  Result Value Ref Range Status   SARS Coronavirus 2 by RT PCR NEGATIVE NEGATIVE Final    Comment: (NOTE) SARS-CoV-2 target nucleic acids are NOT DETECTED.  The SARS-CoV-2 RNA is generally detectable in upper respiratory specimens during the acute phase of infection. The lowest concentration of SARS-CoV-2 viral copies this assay can detect is 138 copies/mL. A negative result does not preclude SARS-Cov-2 infection and should not be used as the sole basis for treatment or other patient management decisions. A negative result may occur with  improper specimen collection/handling, submission of specimen other than nasopharyngeal swab, presence of viral mutation(s) within the areas targeted by this assay, and inadequate number of viral copies(<138 copies/mL). A negative result must be combined with clinical observations, patient history, and  epidemiological information. The expected result is Negative.  Fact Sheet for Patients:  EntrepreneurPulse.com.au  Fact Sheet for Healthcare Providers:  IncredibleEmployment.be  This test is no t yet approved or cleared by the [place] FDA and  has been authorized for detection and/or diagnosis of SARS-CoV-2 by FDA under an Emergency Use Authorization (EUA). This EUA will remain  in effect (meaning this test can be used) for the duration of the COVID-19 declaration under Section 564(b)(1) of the Act, 21 U.S.C.section 360bbb-3(b)(1), unless the authorization is terminated  or revoked sooner.       Influenza A by PCR NEGATIVE NEGATIVE Final   Influenza B by PCR NEGATIVE NEGATIVE Final    Comment: (NOTE) The Xpert Xpress SARS-CoV-2/FLU/RSV plus assay is intended as an aid in the diagnosis of influenza from Nasopharyngeal swab specimens and should not be used as a sole basis for treatment. Nasal washings and aspirates are unacceptable for Xpert Xpress SARS-CoV-2/FLU/RSV testing.  Fact Sheet for Patients: EntrepreneurPulse.com.au  Fact Sheet for Healthcare Providers: IncredibleEmployment.be  This test is not yet approved or cleared by the [place] FDA and has been authorized for detection and/or diagnosis of SARS-CoV-2 by FDA under an Emergency Use Authorization (EUA). This EUA will remain in effect (meaning this test can be used) for the duration of the COVID-19 declaration under Section 564(b)(1) of the Act, 21 U.S.C. section 360bbb-3(b)(1), unless the authorization is terminated or revoked.  Performed at [facility], [place] [address] [place], [place] [address]   SARS CORONAVIRUS 2 (TAT 6-24 HRS) Nasopharyngeal Nasopharyngeal Swab     Status: Abnormal   Collection Time: 08/18/20 11:55 AM   Specimen: Nasopharyngeal Swab  Result Value Ref Range Status   SARS Coronavirus 2  POSITIVE (A) NEGATIVE Final    Comment: (NOTE) SARS-CoV-2 target nucleic acids are DETECTED.  The SARS-CoV-2 RNA is generally detectable in upper and lower respiratory specimens during the acute phase of infection. Positive results are indicative  of the presence of SARS-CoV-2 RNA. Clinical correlation with patient history and other diagnostic information is  necessary to determine patient infection status. Positive results do not rule out bacterial infection or co-infection with other viruses.  The expected result is Negative.  Fact Sheet for Patients: SugarRoll.be  Fact Sheet for Healthcare Providers: https://www.woods-mathews.com/  This test is not yet approved or cleared by the [place] FDA and  has been authorized for detection and/or diagnosis of SARS-CoV-2 by FDA under an Emergency Use Authorization (EUA). This EUA will remain  in effect (meaning this test can be used) for the duration of the COVID-19 declaration under Section 564(b)(1) of the Act, 21 U. S.C. section 360bbb-3(b)(1), unless the authorization is terminated or revoked sooner.   Performed at [facility], [place] [address], [place], [place] [address]   Resp panel by RT-PCR (RSV, Flu A&B, Covid)     Status: Abnormal   Collection Time: 08/18/20 11:30 PM  Result Value Ref Range Status   SARS Coronavirus 2 by RT PCR POSITIVE (A) NEGATIVE Final    Comment: CRITICAL RESULT CALLED TO, READ BACK BY AND VERIFIED WITH: RN K CUMINGS AT 0056 08/19/20 CRU[place] A (NOTE) SARS-CoV-2 target nucleic acids are DETECTED.  The SARS-CoV-2 RNA is generally detectable in upper respiratory specimens during the acute phase of infection. Positive results are indicative of the presence of the identified virus, but do not rule out bacterial infection or co-infection with other pathogens not detected by the test. Clinical correlation with patient history and other diagnostic  information is necessary to determine patient infection status. The expected result is Negative.  Fact Sheet for Patients: EntrepreneurPulse.com.au  Fact Sheet for Healthcare Providers: IncredibleEmployment.be  This test is not yet approved or cleared by the [place] FDA and  has been authorized for detection and/or diagnosis of SARS-CoV-2 by FDA under an Emergency Use Authorization (EUA).  This EUA will remain in effect (meanin g this test can be used) for the duration of  the COVID-19 declaration under Section 564(b)(1) of the Act, 21 U.S.C. section 360bbb-3(b)(1), unless the authorization is terminated or revoked sooner.     Influenza A by PCR NEGATIVE NEGATIVE Final   Influenza B by PCR NEGATIVE NEGATIVE Final    Comment: (NOTE) The Xpert Xpress SARS-CoV-2/FLU/RSV plus assay is intended as an aid in the diagnosis of influenza from Nasopharyngeal swab specimens and should not be used as a sole basis for treatment. Nasal washings and aspirates are unacceptable for Xpert Xpress SARS-CoV-2/FLU/RSV testing.  Fact Sheet for Patients: EntrepreneurPulse.com.au  Fact Sheet for Healthcare Providers: IncredibleEmployment.be  This test is not yet approved or cleared by the [place] FDA and has been authorized for detection and/or diagnosis of SARS-CoV-2 by FDA under an Emergency Use Authorization (EUA). This EUA will remain in effect (meaning this test can be used) for the duration of the COVID-19 declaration under Section 564(b)(1) of the Act, 21 U.S.C. section 360bbb-3(b)(1), unless the authorization is terminated or revoked.     Resp Syncytial Virus by PCR NEGATIVE NEGATIVE Final    Comment: (NOTE) Fact Sheet for Patients: EntrepreneurPulse.com.au  Fact Sheet for Healthcare Providers: IncredibleEmployment.be  This test is not yet approved or cleared by the  [place] FDA and has been authorized for detection and/or diagnosis of SARS-CoV-2 by FDA under an Emergency Use Authorization (EUA). This EUA will remain in effect (meaning this test can be used) for the duration of the COVID-19 declaration under Section 564(b)(1) of the Act,  21 U.S.C. section 360bbb-3(b)(1), unless the authorization is terminated or revoked.  Performed at [facility], [place] [address], [place], [place] [address]       Radiology Studies: No results found.  Scheduled Meds: . apixaban  5 mg Oral BID  . carbidopa-levodopa  1 tablet Oral TID AC  . cyanocobalamin  1,000 mcg Intramuscular Daily  . docusate sodium  100 mg Oral BID  . folic acid  1 mg Oral Daily  . mouth rinse  15 mL Mouth Rinse BID  . risperiDONE  0.5 mg Oral QHS  . sertraline  50 mg Oral Daily   Continuous Infusions: . sodium chloride 60 mL/hr at 08/22/20 0321     LOS: 9 days   Time spent: 25 minutes.  [name], MD [facility] [email or web address] 08/22/2020, 12:25 PM

## 2020-08-22 NOTE — TOC Progression Note (Signed)
Transition of Care [facility]) - Progression Note    Patient Details  Name: Fernando Carr MRN: [id number] Date of Birth: 1940-10-16  Transition of [facility]) CM/SW [place], LCSW Phone Number: 08/22/2020, 4:25 PM  Clinical Narrative:    CSW provided bed offers to pt. daughter [name]. She chose [facility] as her first choice and [facility] as the second choice.   TOC staff will continue to follow.  PASRR pending.    Expected Discharge Plan: Skilled Nursing Facility Barriers to Discharge: [place] (PASRR),SNF Pending bed offer,SNF Pending discharge orders,Insurance Authorization,Continued Medical Work up  Expected Discharge Plan and Services Expected Discharge Plan: [place] In-house Referral: Clinical Social Work   Post Acute Care Choice: [place] Living arrangements for the past 2 months: [place] ([place])                                       Social Determinants of Health (SDOH) Interventions    Readmission Risk Interventions No flowsheet data found.

## 2020-08-23 DIAGNOSIS — N179 Acute kidney failure, unspecified: Secondary | ICD-10-CM | POA: Diagnosis not present

## 2020-08-23 DIAGNOSIS — I517 Cardiomegaly: Secondary | ICD-10-CM | POA: Diagnosis not present

## 2020-08-23 DIAGNOSIS — S72001A Fracture of unspecified part of neck of right femur, initial encounter for closed fracture: Secondary | ICD-10-CM | POA: Diagnosis not present

## 2020-08-23 DIAGNOSIS — N1831 Chronic kidney disease, stage 3a: Secondary | ICD-10-CM | POA: Diagnosis not present

## 2020-08-23 LAB — BASIC METABOLIC PANEL
Anion gap: 5 (ref 5–15)
BUN: 33 mg/dL — ABNORMAL HIGH (ref 8–23)
CO2: 24 mmol/L (ref 22–32)
Calcium: 9.7 mg/dL (ref 8.9–10.3)
Chloride: 107 mmol/L (ref 98–111)
Creatinine, Ser: 1.11 mg/dL (ref 0.61–1.24)
GFR, Estimated: >60 mL/min (ref >60)
Glucose, Bld: 90 mg/dL (ref 70–99)
Potassium: 4.1 mmol/L (ref 3.5–5.1)
Sodium: 136 mmol/L (ref 135–145)

## 2020-08-23 LAB — HEMOGLOBIN AND HEMATOCRIT, BLOOD
HCT: 23 % — ABNORMAL LOW (ref 39.0–52.0)
Hemoglobin: 7.3 g/dL — ABNORMAL LOW (ref 13.0–17.0)

## 2020-08-23 MED ORDER — FERROUS SULFATE 325 (65 FE) MG PO TABS
325.0000 mg | ORAL_TABLET | Freq: Every day | ORAL | Status: DC
  Administered 2020-08-24 – 2020-08-26 (×3): 325 mg via ORAL
  Filled 2020-08-23 (×3): qty 1

## 2020-08-23 NOTE — Progress Notes (Signed)
Spoke with pt sister Arbie Cookey ang updated.

## 2020-08-23 NOTE — Plan of Care (Signed)
  Problem: Activity: Goal: Ability to tolerate increased activity will improve Outcome: Progressing   

## 2020-08-23 NOTE — Progress Notes (Addendum)
PROGRESS NOTE  Fernando Carr  [id number] DOB: September 08, 1940 DOA: 08/13/2020 PCP: [name], MD   Brief Narrative: Fernando Carr is a 80 y.o. male with a history of AFib on eliquis, stage IIIa CKD, PD, AAA s/p repair, severe LVH, HLD, and bipolar disorder who presented on 1/13 from [facility] with right hip pain found to have a displaced right proximal femur fracture and subsequently had THA 1/14. Postoperative course included anemia requiring 1u PRBCs each on 1/15 and 1/17. Plan was for SNF discharge, though screening SARS-CoV-2 PCR was positive. Pt is triple vaccinated and tested negative on admission.   Assessment & Plan: Principal Problem:   Hip fracture ([place]) Active Problems:   CRD (chronic renal disease), stage III (HCC)   MDD (major depressive disorder), recurrent severe, without psychosis ([place])   Syncope   AKI (acute kidney injury) ([place])   LVH (left ventricular hypertrophy)  Proximal right femur fracture s/p right THA 08/14/2020:  - Pain control - Orthopedics follow up, WBAT.  - Continue PT/OT at SNF. OOB daily ordered. - On anticoagulation as below  Constipation:  - Added bowel regimen scheduled with prn suppository. If no results, may need enema.   Breakthrough covid-19 infection: + 1/18 on screening test. No symptoms in pt vaccinated x3.  - No current Tx indicated.  - If remains asymptomatic, isolation period would be 5 days (last day of isolation 1/23)  Acute postoperative blood loss anemia on anemia of chronic disease: s/p 1u PRBCs total. - H/H is stable though near transfusion threshold. Pt denies all symptoms of anemia, so this will be monitored again 1/24. No bleeding noted, so continuing anticoagulation. - Empiric iron supplement  AKI on stage IIIa CKD: Improved.  - Avoid nephrotoxins  PAF, LVH:  - Continue eliquis - Continue cardiology follow up.   Bipolar disorder: Quiescent.  - Continue risperidone, SSRI  PD:  - Continue sinemet  Acute  metabolic encephalopathy: Resolved.   Vitamin B12 deficiency:  - Supplement  Folic acid deficiency:  - Supplement  Fall, possible syncope:  - Continue telemetry  Obesity: Estimated body mass index is 31.19 kg/m as calculated from the following:   Height as of this encounter: 6' (1.829 m).   Weight as of this encounter: 104.3 kg.  DVT prophylaxis: Eliquis Code Status: DNR Family Communication: None at bedside Disposition Plan:  Status is: Inpatient  Remains inpatient appropriate because:Unsafe d/c plan  Dispo: The patient is from: ILF              Anticipated d/c is to: SNF - [facility] pending, bed offers have been reviewed with family.              Anticipated d/c date is: 1 day              Patient currently is medically stable to d/c.  Consultants:   Orthopedics  Procedures:   Right THA 08/14/2020  Antimicrobials:  None   Subjective: Eating well, drinking normally, still feels diffusely weak but able to pull himself up in bed. No hip/thigh pain. No palpitations, dizziness, lightheadedness, chest pain or dyspnea. No bleeding noted.  Objective: Vitals:   08/22/20 0538 08/22/20 1334 08/22/20 2242 08/23/20 0415  BP: (!) 166/78 114/66 131/76 (!) 151/76  Pulse: 68 61 60 60  Resp: 16 18 20    Temp: 97.8 F (36.6 C) 97.9 F (36.6 C) 98 F (36.7 C) 97.7 F (36.5 C)  TempSrc: Oral Oral Oral Oral  SpO2:  97% 95%  95%  Weight:      Height:        Intake/Output Summary (Last 24 hours) at 08/23/2020 1136 Last data filed at 08/23/2020 1100 Gross per 24 hour  Intake 2133.63 ml  Output 1300 ml  Net 833.63 ml   Filed Weights   08/13/20 0956 08/14/20 1708 08/20/20 0544  Weight: 102.1 kg 102 kg 104.3 kg   Gen: 80 y.o. male in no distress Pulm: Nonlabored breathing room air. Clear. CV: Regular rate and rhythm. No murmur, rub, or gallop. No JVD, no dependent edema. GI: Abdomen soft, non-tender, non-distended, with normoactive bowel sounds.  Ext: Warm, no  deformities Skin: No new rashes, lesions or ulcers on visualized skin. Dressings c/d/i Neuro: Alert and oriented. No focal neurological deficits. Psych: Judgement and insight appear fair. Mood euthymic & affect congruent. Behavior is appropriate.    Data Reviewed: I have personally reviewed following labs and imaging studies  CBC: Recent Labs  Lab 08/17/20 0643 08/18/20 0539 08/19/20 0447 08/20/20 0514 08/21/20 0456 08/23/20 0445  WBC 5.0 5.2 4.8 5.1  --   --   NEUTROABS  --   --   --  3.4  --   --   HGB 6.8* 7.5* 7.4* 7.6* 7.4* 7.3*  HCT 21.5* 24.1* 23.6* 23.9* 24.2* 23.0*  MCV 93.9 95.6 96.3 96.4  --   --   PLT 144* 155 173 176  --   --    Basic Metabolic Panel: Recent Labs  Lab 08/17/20 0643 08/18/20 0539 08/19/20 0447 08/20/20 0514 08/21/20 0456 08/23/20 0445  NA 139 138 138 137 137 136  K 4.5 4.5 4.7 4.6 4.5 4.1  CL 107 106 107 104 106 107  CO2 23 22 23 26 22 24   GLUCOSE 101* 103* 102* 97 103* 90  BUN 35* 37* 41* 43* 42* 33*  CREATININE 1.38* 1.46* 1.00 1.39* 1.43* 1.11  CALCIUM 10.0 10.0 10.4* 10.4* 10.2 9.7  MG 1.9  --   --   --  2.0  --   PHOS  --   --   --   --  2.9  --    GFR: Estimated Creatinine Clearance: 67.4 mL/min (by C-G formula based on SCr of 1.11 mg/dL). Liver Function Tests: Recent Labs  Lab 08/20/20 0514 08/21/20 0456  AST 38  --   ALT 14  --   ALKPHOS 61  --   BILITOT 0.4  --   PROT 6.1*  --   ALBUMIN 3.0* 3.0*   No results for input(s): LIPASE, AMYLASE in the last 168 hours. No results for input(s): AMMONIA in the last 168 hours. Coagulation Profile: No results for input(s): INR, PROTIME in the last 168 hours. Cardiac Enzymes: No results for input(s): CKTOTAL, CKMB, CKMBINDEX, TROPONINI in the last 168 hours. BNP (last 3 results) No results for input(s): PROBNP in the last 8760 hours. HbA1C: No results for input(s): HGBA1C in the last 72 hours. CBG: Recent Labs  Lab 08/18/20 0431 08/19/20 0640 08/20/20 0540 08/21/20 0612  08/22/20 0519  GLUCAP 98 90 90 91 77   Lipid Profile: No results for input(s): CHOL, HDL, LDLCALC, TRIG, CHOLHDL, LDLDIRECT in the last 72 hours. Thyroid Function Tests: No results for input(s): TSH, T4TOTAL, FREET4, T3FREE, THYROIDAB in the last 72 hours. Anemia Panel: No results for input(s): VITAMINB12, FOLATE, FERRITIN, TIBC, IRON, RETICCTPCT in the last 72 hours. Urine analysis:    Component Value Date/Time   COLORURINE YELLOW 09/26/2019 1939   APPEARANCEUR CLEAR 09/26/2019 1939  LABSPEC 1.020 09/26/2019 1939   PHURINE 6.5 09/26/2019 1939   GLUCOSEU NEGATIVE 09/26/2019 1939   HGBUR NEGATIVE 09/26/2019 1939   BILIRUBINUR NEGATIVE 09/26/2019 1939   KETONESUR NEGATIVE 09/26/2019 1939   PROTEINUR 30 (A) 09/26/2019 1939   UROBILINOGEN 1.0 08/19/2014 1745   NITRITE NEGATIVE 09/26/2019 1939   LEUKOCYTESUR NEGATIVE 09/26/2019 1939   Recent Results (from the past 240 hour(s))  Resp Panel by RT-PCR (Flu A&B, Covid) Nasopharyngeal Swab     Status: None   Collection Time: 08/13/20 11:58 AM   Specimen: Nasopharyngeal Swab; Nasopharyngeal(NP) swabs in vial transport medium  Result Value Ref Range Status   SARS Coronavirus 2 by RT PCR NEGATIVE NEGATIVE Final    Comment: (NOTE) SARS-CoV-2 target nucleic acids are NOT DETECTED.  The SARS-CoV-2 RNA is generally detectable in upper respiratory specimens during the acute phase of infection. The lowest concentration of SARS-CoV-2 viral copies this assay can detect is 138 copies/mL. A negative result does not preclude SARS-Cov-2 infection and should not be used as the sole basis for treatment or other patient management decisions. A negative result may occur with  improper specimen collection/handling, submission of specimen other than nasopharyngeal swab, presence of viral mutation(s) within the areas targeted by this assay, and inadequate number of viral copies(<138 copies/mL). A negative result must be combined with clinical  observations, patient history, and epidemiological information. The expected result is Negative.  Fact Sheet for Patients:  EntrepreneurPulse.com.au  Fact Sheet for Healthcare Providers:  IncredibleEmployment.be  This test is no t yet approved or cleared by the [place] FDA and  has been authorized for detection and/or diagnosis of SARS-CoV-2 by FDA under an Emergency Use Authorization (EUA). This EUA will remain  in effect (meaning this test can be used) for the duration of the COVID-19 declaration under Section 564(b)(1) of the Act, 21 U.S.C.section 360bbb-3(b)(1), unless the authorization is terminated  or revoked sooner.       Influenza A by PCR NEGATIVE NEGATIVE Final   Influenza B by PCR NEGATIVE NEGATIVE Final    Comment: (NOTE) The Xpert Xpress SARS-CoV-2/FLU/RSV plus assay is intended as an aid in the diagnosis of influenza from Nasopharyngeal swab specimens and should not be used as a sole basis for treatment. Nasal washings and aspirates are unacceptable for Xpert Xpress SARS-CoV-2/FLU/RSV testing.  Fact Sheet for Patients: EntrepreneurPulse.com.au  Fact Sheet for Healthcare Providers: IncredibleEmployment.be  This test is not yet approved or cleared by the [place] FDA and has been authorized for detection and/or diagnosis of SARS-CoV-2 by FDA under an Emergency Use Authorization (EUA). This EUA will remain in effect (meaning this test can be used) for the duration of the COVID-19 declaration under Section 564(b)(1) of the Act, 21 U.S.C. section 360bbb-3(b)(1), unless the authorization is terminated or revoked.  Performed at [facility], [place] [address], [place], [place] [address]   SARS CORONAVIRUS 2 (TAT 6-24 HRS) Nasopharyngeal Nasopharyngeal Swab     Status: Abnormal   Collection Time: 08/18/20 11:55 AM   Specimen: Nasopharyngeal Swab  Result Value Ref  Range Status   SARS Coronavirus 2 POSITIVE (A) NEGATIVE Final    Comment: (NOTE) SARS-CoV-2 target nucleic acids are DETECTED.  The SARS-CoV-2 RNA is generally detectable in upper and lower respiratory specimens during the acute phase of infection. Positive results are indicative of the presence of SARS-CoV-2 RNA. Clinical correlation with patient history and other diagnostic information is  necessary to determine patient infection status. Positive results do not rule out  bacterial infection or co-infection with other viruses.  The expected result is Negative.  Fact Sheet for Patients: SugarRoll.be  Fact Sheet for Healthcare Providers: https://www.woods-mathews.com/  This test is not yet approved or cleared by the [place] FDA and  has been authorized for detection and/or diagnosis of SARS-CoV-2 by FDA under an Emergency Use Authorization (EUA). This EUA will remain  in effect (meaning this test can be used) for the duration of the COVID-19 declaration under Section 564(b)(1) of the Act, 21 U. S.C. section 360bbb-3(b)(1), unless the authorization is terminated or revoked sooner.   Performed at [facility], [place] [address] [place], [place] [address]   Resp panel by RT-PCR (RSV, Flu A&B, Covid)     Status: Abnormal   Collection Time: 08/18/20 11:30 PM  Result Value Ref Range Status   SARS Coronavirus 2 by RT PCR POSITIVE (A) NEGATIVE Final    Comment: CRITICAL RESULT CALLED TO, READ BACK BY AND VERIFIED WITH: RN K CUMINGS AT 0056 08/19/20 CRUICKSHANK A (NOTE) SARS-CoV-2 target nucleic acids are DETECTED.  The SARS-CoV-2 RNA is generally detectable in upper respiratory specimens during the acute phase of infection. Positive results are indicative of the presence of the identified virus, but do not rule out bacterial infection or co-infection with other pathogens not detected by the test. Clinical correlation with patient  history and other diagnostic information is necessary to determine patient infection status. The expected result is Negative.  Fact Sheet for Patients: EntrepreneurPulse.com.au  Fact Sheet for Healthcare Providers: IncredibleEmployment.be  This test is not yet approved or cleared by the [place] FDA and  has been authorized for detection and/or diagnosis of SARS-CoV-2 by FDA under an Emergency Use Authorization (EUA).  This EUA will remain in effect (meanin g this test can be used) for the duration of  the COVID-19 declaration under Section 564(b)(1) of the Act, 21 U.S.C. section 360bbb-3(b)(1), unless the authorization is terminated or revoked sooner.     Influenza A by PCR NEGATIVE NEGATIVE Final   Influenza B by PCR NEGATIVE NEGATIVE Final    Comment: (NOTE) The Xpert Xpress SARS-CoV-2/FLU/RSV plus assay is intended as an aid in the diagnosis of influenza from Nasopharyngeal swab specimens and should not be used as a sole basis for treatment. Nasal washings and aspirates are unacceptable for Xpert Xpress SARS-CoV-2/FLU/RSV testing.  Fact Sheet for Patients: EntrepreneurPulse.com.au  Fact Sheet for Healthcare Providers: IncredibleEmployment.be  This test is not yet approved or cleared by the [place] FDA and has been authorized for detection and/or diagnosis of SARS-CoV-2 by FDA under an Emergency Use Authorization (EUA). This EUA will remain in effect (meaning this test can be used) for the duration of the COVID-19 declaration under Section 564(b)(1) of the Act, 21 U.S.C. section 360bbb-3(b)(1), unless the authorization is terminated or revoked.     Resp Syncytial Virus by PCR NEGATIVE NEGATIVE Final    Comment: (NOTE) Fact Sheet for Patients: EntrepreneurPulse.com.au  Fact Sheet for Healthcare Providers: IncredibleEmployment.be  This test is not yet  approved or cleared by the [place] FDA and has been authorized for detection and/or diagnosis of SARS-CoV-2 by FDA under an Emergency Use Authorization (EUA). This EUA will remain in effect (meaning this test can be used) for the duration of the COVID-19 declaration under Section 564(b)(1) of the Act, 21 U.S.C. section 360bbb-3(b)(1), unless the authorization is terminated or revoked.  Performed at [facility], [place] [address] [place], [place] [address]  Radiology Studies: No results found.  Scheduled Meds: . apixaban  5 mg Oral BID  . carbidopa-levodopa  1 tablet Oral TID AC  . cyanocobalamin  1,000 mcg Intramuscular Daily  . docusate sodium  100 mg Oral BID  . folic acid  1 mg Oral Daily  . mouth rinse  15 mL Mouth Rinse BID  . polyethylene glycol  17 g Oral Daily  . risperiDONE  0.5 mg Oral QHS  . senna-docusate  2 tablet Oral BID  . sertraline  50 mg Oral Daily   Continuous Infusions: . sodium chloride 60 mL/hr at 08/22/20 2045     LOS: 10 days   Time spent: 25 minutes.  [name], MD [facility] [email or web address] 08/23/2020, 11:36 AM

## 2020-08-23 NOTE — Progress Notes (Signed)
Physical Therapy Treatment Patient Details Name: Fernando Carr MRN: [id number] DOB: [birth year] Today's Date: 08/23/2020    History of Present Illness Pt admitted from [facility] s/p fall with R hip fx and now s/p R THR by anterior direct approach.  Pt with hx of CKD, Parkinsons, bipolar, AAA, and A-fib    PT Comments    Pt is progressing steadily toward PT goals. He does continue to fatigue easily however demonstrates improved activity tolerance and requiring decr assist overall with functional mobility tasks.   Follow Up Recommendations  SNF     Equipment Recommendations  Rolling walker with 5" wheels    Recommendations for Other Services       Precautions / Restrictions Precautions Precautions: Fall Restrictions Weight Bearing Restrictions: No Other Position/Activity Restrictions: WBAT    Mobility  Bed Mobility Overal bed mobility: Needs Assistance Bed Mobility: Supine to Sit     Supine to sit: Min guard;HOB elevated;Min assist     General bed mobility comments: Increased time with cues for sequence and use of L LE to self assist, light physical assist to intitiate movement RLE  Transfers Overall transfer level: Needs assistance Equipment used: Rolling walker (2 wheeled) Transfers: Sit to/from Stand Sit to Stand: Min guard;From elevated surface         General transfer comment: cues for hand placement and wt shfit to standing  Ambulation/Gait Ambulation/Gait assistance: Min guard;Min assist Gait Distance (Feet): 25 Feet Assistive device: Rolling walker (2 wheeled) Gait Pattern/deviations: Step-to pattern;Decreased step length - right;Decreased step length - left;Shuffle;Trunk flexed;Wide base of support Gait velocity: decr   General Gait Details: multi-modal cues for incr step length and trunk extension, steadying assist with turns, without overt LOB   Stairs             Wheelchair Mobility    Modified Rankin (Stroke Patients Only)        Balance                                            Cognition Arousal/Alertness: Awake/alert Behavior During Therapy: WFL for tasks assessed/performed Overall Cognitive Status: Within Functional Limits for tasks assessed                                        Exercises Total Joint Exercises Ankle Circles/Pumps: AROM;Both;15 reps;Supine    General Comments        Pertinent Vitals/Pain Pain Assessment: No/denies pain    Home Living                      Prior Function            PT Goals (current goals can now be found in the care plan section) Acute Rehab PT Goals Patient Stated Goal: Regain IND, return to [facility] PT Goal Formulation: With patient Time For Goal Achievement: 08/30/20 Potential to Achieve Goals: Good Progress towards PT goals: Progressing toward goals    Frequency    Min 3X/week      PT Plan Current plan remains appropriate    Co-evaluation              AM-PAC PT "6 Clicks" Mobility   Outcome Measure  Help needed turning from your back to your side while in  a flat bed without using bedrails?: A Little Help needed moving from lying on your back to sitting on the side of a flat bed without using bedrails?: A Little Help needed moving to and from a bed to a chair (including a wheelchair)?: A Little Help needed standing up from a chair using your arms (e.g., wheelchair or bedside chair)?: A Little Help needed to walk in hospital room?: A Little Help needed climbing 3-5 steps with a railing? : A Lot 6 Click Score: 17    End of Session Equipment Utilized During Treatment: Gait belt Activity Tolerance: Patient tolerated treatment well Patient left: in chair;with call bell/phone within reach;with chair alarm set Nurse Communication: Mobility status PT Visit Diagnosis: Difficulty in walking, not elsewhere classified (R26.2)     Time: 5093-2671 PT Time Calculation (min) (ACUTE ONLY): 26  min  Charges:  $Gait Training: 8-22 mins $Therapeutic Activity: 8-22 mins                     Baxter Flattery, PT  Acute Rehab Dept ([place]) [phone] Pager [phone]  08/23/2020    [facility] 08/23/2020, 1:30 PM

## 2020-08-24 DIAGNOSIS — N1831 Chronic kidney disease, stage 3a: Secondary | ICD-10-CM | POA: Diagnosis not present

## 2020-08-24 DIAGNOSIS — I517 Cardiomegaly: Secondary | ICD-10-CM | POA: Diagnosis not present

## 2020-08-24 DIAGNOSIS — D649 Anemia, unspecified: Secondary | ICD-10-CM

## 2020-08-24 DIAGNOSIS — S72001A Fracture of unspecified part of neck of right femur, initial encounter for closed fracture: Secondary | ICD-10-CM | POA: Diagnosis not present

## 2020-08-24 DIAGNOSIS — N179 Acute kidney failure, unspecified: Secondary | ICD-10-CM | POA: Diagnosis not present

## 2020-08-24 LAB — CBC
HCT: 22.4 % — ABNORMAL LOW (ref 39.0–52.0)
Hemoglobin: 7.1 g/dL — ABNORMAL LOW (ref 13.0–17.0)
MCH: 30.1 pg (ref 26.0–34.0)
MCHC: 31.7 g/dL (ref 30.0–36.0)
MCV: 94.9 fL (ref 80.0–100.0)
Platelets: 269 10*3/uL (ref 150–400)
RBC: 2.36 MIL/uL — ABNORMAL LOW (ref 4.22–5.81)
RDW: 12.4 % (ref 11.5–15.5)
WBC: 5.9 10*3/uL (ref 4.0–10.5)
nRBC: 0 % (ref 0.0–0.2)

## 2020-08-24 LAB — PREPARE RBC (CROSSMATCH)

## 2020-08-24 MED ORDER — SODIUM CHLORIDE 0.9% IV SOLUTION: Freq: Once | INTRAVENOUS | Status: AC

## 2020-08-24 NOTE — Progress Notes (Signed)
PROGRESS NOTE  Fernando Carr  [id number] DOB: [birth year] DOA: 08/13/2020 PCP: [name], MD   Brief Narrative: Fernando Carr is a 80 y.o. male with a history of AFib on eliquis, stage IIIa CKD, PD, AAA s/p repair, severe LVH, HLD, and bipolar disorder who presented on 1/13 from [facility] [place] with right hip pain found to have a displaced right proximal femur fracture and subsequently had THA 1/14. Postoperative course included anemia requiring 1u PRBCs each on 1/15, 1/17, and 1/24. Plan was for SNF discharge, though screening SARS-CoV-2 PCR was positive. Pt is triple vaccinated and tested negative on admission. He has now completed 5 days of isolation per CDC guidelines for a fully vaccinated asymptomatic positive case. SNF disposition is pending.  Assessment & Plan: Principal Problem:   Hip fracture ([place]) Active Problems:   CRD (chronic renal disease), stage III (HCC)   MDD (major depressive disorder), recurrent severe, without psychosis ([place])   Syncope   AKI (acute kidney injury) ([place])   LVH (left ventricular hypertrophy)  Proximal right femur fracture s/p right THA 08/14/2020:  - Pain control - Orthopedics follow up, WBAT.  - Continue PT/OT at SNF. OOB daily ordered. - On anticoagulation as below  Constipation:  - Continue bowel regimen with opioids, having BMs.  Breakthrough asymptomatic covid-19 infection: + 1/18 on screening test. No symptoms in pt vaccinated x3.  - No current Tx indicated. Off isolation as of 1/24  Acute postoperative blood loss anemia on anemia of chronic disease: s/p 3u PRBCs total, last 1/24. - H/H slightly down with negative nRBCs. Pt is weak on exertion, so will repeat PRBCs x1u today.  - No bleeding noted, so continuing anticoagulation. - Empiric iron supplement  AKI on stage IIIa CKD: Improved.  - Avoid nephrotoxins  PAF, LVH:  - Continue eliquis - Continue cardiology follow up.   Bipolar disorder: Quiescent.  - Continue risperidone,  SSRI  PD:  - Continue sinemet  Acute metabolic encephalopathy: Resolved.   Vitamin B12 deficiency:  - Supplement  Folic acid deficiency:  - Supplement  Fall, possible syncope:  - Continue telemetry  Obesity: Estimated body mass index is 31.19 kg/m as calculated from the following:   Height as of this encounter: 6' (1.829 m).   Weight as of this encounter: 104.3 kg.  DVT prophylaxis: Eliquis Code Status: DNR Family Communication: None at bedside Disposition Plan:  Status is: Inpatient  Remains inpatient appropriate because:Unsafe d/c plan  Dispo: The patient is from: ILF              Anticipated d/c is to: SNF - PASRR pending, bed offers have been reviewed with family.              Anticipated d/c date is: 1 day Patient is formally discharged if bed is available today.              Patient currently is medically stable to d/c.  Consultants:   Orthopedics  Procedures:   Right THA 08/14/2020  Antimicrobials:  None   Subjective: Volunteers no complaints but does confirm he is exertionally dyspneic more than his baseline. Eating well, pain controlled, denies any source of bleeding.  Objective: Vitals:   08/23/20 1440 08/23/20 1948 08/24/20 0600 08/24/20 1224  BP: 131/67 139/66 130/71 (!) 143/77  Pulse: (!) 59 63 65 61  Resp: 17 18 16 20   Temp: (!) 97.5 F (36.4 C) 98.2 F (36.8 C) 98.2 F (36.8 C) 98 F (36.7 C)  TempSrc:  Oral Oral Oral Axillary  SpO2: 98% 100% 95% 100%  Weight:      Height:        Intake/Output Summary (Last 24 hours) at 08/24/2020 1246 Last data filed at 08/23/2020 2230 Gross per 24 hour  Intake 1200 ml  Output 650 ml  Net 550 ml   Filed Weights   08/13/20 0956 08/14/20 1708 08/20/20 0544  Weight: 102.1 kg 102 kg 104.3 kg   Gen: 80 y.o. male in no distress Pulm: Nonlabored breathing room air. Clear. CV: Regular rate and rhythm. No murmur, rub, or gallop. No JVD, no dependent edema. GI: Abdomen soft, non-tender, non-distended,  with normoactive bowel sounds.  Ext: Warm, no deformities Skin: No rashes, lesions or ulcers on visualized skin. RLE w/c/d/i dressings. Neuro: Alert and oriented. No focal neurological deficits. Psych: Judgement and insight appear fair. Mood euthymic & affect congruent. Behavior is appropriate.    Data Reviewed: I have personally reviewed following labs and imaging studies  CBC: Recent Labs  Lab 08/18/20 0539 08/19/20 0447 08/20/20 0514 08/21/20 0456 08/23/20 0445 08/24/20 0443  WBC 5.2 4.8 5.1  --   --  5.9  NEUTROABS  --   --  3.4  --   --   --   HGB 7.5* 7.4* 7.6* 7.4* 7.3* 7.1*  HCT 24.1* 23.6* 23.9* 24.2* 23.0* 22.4*  MCV 95.6 96.3 96.4  --   --  94.9  PLT 155 173 176  --   --  786   Basic Metabolic Panel: Recent Labs  Lab 08/18/20 0539 08/19/20 0447 08/20/20 0514 08/21/20 0456 08/23/20 0445  NA 138 138 137 137 136  K 4.5 4.7 4.6 4.5 4.1  CL 106 107 104 106 107  CO2 22 23 26 22 24   GLUCOSE 103* 102* 97 103* 90  BUN 37* 41* 43* 42* 33*  CREATININE 1.46* 1.00 1.39* 1.43* 1.11  CALCIUM 10.0 10.4* 10.4* 10.2 9.7  MG  --   --   --  2.0  --   PHOS  --   --   --  2.9  --    GFR: Estimated Creatinine Clearance: 67.4 mL/min (by C-G formula based on SCr of 1.11 mg/dL). Liver Function Tests: Recent Labs  Lab 08/20/20 0514 08/21/20 0456  AST 38  --   ALT 14  --   ALKPHOS 61  --   BILITOT 0.4  --   PROT 6.1*  --   ALBUMIN 3.0* 3.0*   No results for input(s): LIPASE, AMYLASE in the last 168 hours. No results for input(s): AMMONIA in the last 168 hours. Coagulation Profile: No results for input(s): INR, PROTIME in the last 168 hours. Cardiac Enzymes: No results for input(s): CKTOTAL, CKMB, CKMBINDEX, TROPONINI in the last 168 hours. BNP (last 3 results) No results for input(s): PROBNP in the last 8760 hours. HbA1C: No results for input(s): HGBA1C in the last 72 hours. CBG: Recent Labs  Lab 08/18/20 0431 08/19/20 0640 08/20/20 0540 08/21/20 0612  08/22/20 0519  GLUCAP 98 90 90 91 77   Lipid Profile: No results for input(s): CHOL, HDL, LDLCALC, TRIG, CHOLHDL, LDLDIRECT in the last 72 hours. Thyroid Function Tests: No results for input(s): TSH, T4TOTAL, FREET4, T3FREE, THYROIDAB in the last 72 hours. Anemia Panel: No results for input(s): VITAMINB12, FOLATE, FERRITIN, TIBC, IRON, RETICCTPCT in the last 72 hours. Urine analysis:    Component Value Date/Time   COLORURINE YELLOW 09/26/2019 1939   APPEARANCEUR CLEAR 09/26/2019 1939   LABSPEC 1.020 09/26/2019  [place] 6.5 09/26/2019 1939   GLUCOSEU NEGATIVE 09/26/2019 1939   HGBUR NEGATIVE 09/26/2019 [place] 09/26/2019 1939   KETONESUR NEGATIVE 09/26/2019 1939   PROTEINUR 30 (A) 09/26/2019 1939   UROBILINOGEN 1.0 08/19/2014 1745   NITRITE NEGATIVE 09/26/2019 1939   LEUKOCYTESUR NEGATIVE 09/26/2019 1939   Recent Results (from the past 240 hour(s))  SARS CORONAVIRUS 2 (TAT 6-24 HRS) Nasopharyngeal Nasopharyngeal Swab     Status: Abnormal   Collection Time: 08/18/20 11:55 AM   Specimen: Nasopharyngeal Swab  Result Value Ref Range Status   SARS Coronavirus 2 POSITIVE (A) NEGATIVE Final    Comment: (NOTE) SARS-CoV-2 target nucleic acids are DETECTED.  The SARS-CoV-2 RNA is generally detectable in upper and lower respiratory specimens during the acute phase of infection. Positive results are indicative of the presence of SARS-CoV-2 RNA. Clinical correlation with patient history and other diagnostic information is  necessary to determine patient infection status. Positive results do not rule out bacterial infection or co-infection with other viruses.  The expected result is Negative.  Fact Sheet for Patients: SugarRoll.be  Fact Sheet for Healthcare Providers: https://www.woods-mathews.com/  This test is not yet approved or cleared by the [place] FDA and  has been authorized for detection and/or  diagnosis of SARS-CoV-2 by FDA under an Emergency Use Authorization (EUA). This EUA will remain  in effect (meaning this test can be used) for the duration of the COVID-19 declaration under Section 564(b)(1) of the Act, 21 U. S.C. section 360bbb-3(b)(1), unless the authorization is terminated or revoked sooner.   Performed at [facility], [place] [address] [place], [place] [address]   Resp panel by RT-PCR (RSV, Flu A&B, Covid)     Status: Abnormal   Collection Time: 08/18/20 11:30 PM  Result Value Ref Range Status   SARS Coronavirus 2 by RT PCR POSITIVE (A) NEGATIVE Final    Comment: CRITICAL RESULT CALLED TO, READ BACK BY AND VERIFIED WITH: RN K CUMINGS AT 0056 08/19/20 CR[place] A (NOTE) SARS-CoV-2 target nucleic acids are DETECTED.  The SARS-CoV-2 RNA is generally detectable in upper respiratory specimens during the acute phase of infection. Positive results are indicative of the presence of the identified virus, but do not rule out bacterial infection or co-infection with other pathogens not detected by the test. Clinical correlation with patient history and other diagnostic information is necessary to determine patient infection status. The expected result is Negative.  Fact Sheet for Patients: EntrepreneurPulse.com.au  Fact Sheet for Healthcare Providers: IncredibleEmployment.be  This test is not yet approved or cleared by the [place] FDA and  has been authorized for detection and/or diagnosis of SARS-CoV-2 by FDA under an Emergency Use Authorization (EUA).  This EUA will remain in effect (meanin g this test can be used) for the duration of  the COVID-19 declaration under Section 564(b)(1) of the Act, 21 U.S.C. section 360bbb-3(b)(1), unless the authorization is terminated or revoked sooner.     Influenza A by PCR NEGATIVE NEGATIVE Final   Influenza B by PCR NEGATIVE NEGATIVE Final    Comment: (NOTE) The Xpert Xpress  SARS-CoV-2/FLU/RSV plus assay is intended as an aid in the diagnosis of influenza from Nasopharyngeal swab specimens and should not be used as a sole basis for treatment. Nasal washings and aspirates are unacceptable for Xpert Xpress SARS-CoV-2/FLU/RSV testing.  Fact Sheet for Patients: EntrepreneurPulse.com.au  Fact Sheet for Healthcare Providers: IncredibleEmployment.be  This test is not yet approved or cleared by the [place]  States FDA and has been authorized for detection and/or diagnosis of SARS-CoV-2 by FDA under an Emergency Use Authorization (EUA). This EUA will remain in effect (meaning this test can be used) for the duration of the COVID-19 declaration under Section 564(b)(1) of the Act, 21 U.S.C. section 360bbb-3(b)(1), unless the authorization is terminated or revoked.     Resp Syncytial Virus by PCR NEGATIVE NEGATIVE Final    Comment: (NOTE) Fact Sheet for Patients: EntrepreneurPulse.com.au  Fact Sheet for Healthcare Providers: IncredibleEmployment.be  This test is not yet approved or cleared by the [place] FDA and has been authorized for detection and/or diagnosis of SARS-CoV-2 by FDA under an Emergency Use Authorization (EUA). This EUA will remain in effect (meaning this test can be used) for the duration of the COVID-19 declaration under Section 564(b)(1) of the Act, 21 U.S.C. section 360bbb-3(b)(1), unless the authorization is terminated or revoked.  Performed at [facility], [place] [address] [place], [place] [address]       Radiology Studies: No results found.  Scheduled Meds: . sodium chloride   Intravenous Once  . apixaban  5 mg Oral BID  . carbidopa-levodopa  1 tablet Oral TID AC  . cyanocobalamin  1,000 mcg Intramuscular Daily  . docusate sodium  100 mg Oral BID  . ferrous sulfate  325 mg Oral Q breakfast  . folic acid  1 mg Oral Daily  . mouth rinse   15 mL Mouth Rinse BID  . polyethylene glycol  17 g Oral Daily  . risperiDONE  0.5 mg Oral QHS  . senna-docusate  2 tablet Oral BID  . sertraline  50 mg Oral Daily   Continuous Infusions:    LOS: 11 days   Time spent: 35 minutes.  [name], MD [facility] [email or web address] 08/24/2020, 12:46 PM

## 2020-08-24 NOTE — Care Management Important Message (Signed)
Medicare important message printed for Cookie McGibboney, NCM to give to the patient. 

## 2020-08-24 NOTE — TOC Progression Note (Signed)
Transition of Care [facility]) - Progression Note    Patient Details  Name: Fernando Carr MRN: [id number] Date of Birth: 01-26-1941  Transition of [facility]) CM/SW Contact  Purcell Mouton, RN Phone Number: 08/24/2020, 1:32 PM  Clinical Narrative:    SNF declined pt related to not being 10 days out from being tested positive for COVID. SNF states it is not following new CDC guidelines of 5 days.    Expected Discharge Plan: Skilled Nursing Facility Barriers to Discharge: [place] (PASRR),SNF Pending bed offer,SNF Pending discharge orders,Insurance Authorization,Continued Medical Work up  Expected Discharge Plan and Services Expected Discharge Plan: [place] In-house Referral: Clinical Social Work   Post Acute Care Choice: [place] Living arrangements for the past 2 months: [place] ([place]) Expected Discharge Date: 08/24/20                                     Social Determinants of Health (SDOH) Interventions    Readmission Risk Interventions No flowsheet data found.

## 2020-08-24 NOTE — Plan of Care (Signed)
  Problem: Activity: Goal: Ability to tolerate increased activity will improve Outcome: Progressing   Problem: Pain Management: Goal: Pain level will decrease with appropriate interventions Outcome: Progressing   Problem: Skin Integrity: Goal: Will show signs of wound healing Outcome: Progressing   Problem: Safety: Goal: Ability to remain free from injury will improve Outcome: Progressing

## 2020-08-24 NOTE — Plan of Care (Signed)
  Problem: Education: Goal: Understanding of discharge needs will improve Outcome: Completed/Met

## 2020-08-25 LAB — BPAM RBC
Blood Product Expiration Date: 202202192359
ISSUE DATE / TIME: 202201241232
Unit Type and Rh: 5100

## 2020-08-25 LAB — TYPE AND SCREEN
ABO/RH(D): O POS
Antibody Screen: NEGATIVE
Unit division: 0

## 2020-08-25 LAB — HEMOGLOBIN AND HEMATOCRIT, BLOOD
HCT: 27.1 % — ABNORMAL LOW (ref 39.0–52.0)
Hemoglobin: 8.5 g/dL — ABNORMAL LOW (ref 13.0–17.0)

## 2020-08-25 LAB — GLUCOSE, CAPILLARY: Glucose-Capillary: 86 mg/dL (ref 70–99)

## 2020-08-25 MED ORDER — POLYETHYLENE GLYCOL 3350 17 G PO PACK: 17.0000 g | PACK | Freq: Every day | ORAL | 0 refills | Status: DC

## 2020-08-25 MED ORDER — FOLIC ACID 1 MG PO TABS: 1.0000 mg | ORAL_TABLET | Freq: Every day | ORAL | Status: DC

## 2020-08-25 MED ORDER — FERROUS SULFATE 325 (65 FE) MG PO TABS: 325.0000 mg | ORAL_TABLET | Freq: Every day | ORAL | 3 refills | Status: DC

## 2020-08-25 MED ORDER — VITAMIN B-12 1000 MCG PO TABS: 1000.0000 ug | ORAL_TABLET | Freq: Every day | ORAL | Status: DC

## 2020-08-25 NOTE — Progress Notes (Signed)
Called report and spoke to Teacher, English as a foreign language at [employer].

## 2020-08-25 NOTE — TOC Progression Note (Addendum)
Transition of Care [facility]) - Progression Note    Patient Details  Name: Fernando Carr MRN: [id number] Date of Birth: 03/02/1941  Transition of Care [facility]) CM/SW Contact  Purcell Mouton, RN Phone Number: 08/25/2020, 1:28 PM  Clinical Narrative:     [facility] in [place] has accepted pt. Spoke with pt who agreed with going to SNF. A call to daughter was made left VM. A call to pt's sister Beather Arbour was call concerning bed offer to [facility].  Mrs. Beather Arbour agreed.   Expected Discharge Plan: Skilled Nursing Facility Barriers to Discharge: [place] (PASRR),SNF Pending bed offer,SNF Pending discharge orders,Insurance Authorization,Continued Medical Work up  Expected Discharge Plan and Services Expected Discharge Plan: [place] In-house Referral: Clinical Social Work   Post Acute Care Choice: [place] Living arrangements for the past 2 months: [place] ([place]) Expected Discharge Date: 08/24/20                                     Social Determinants of Health (SDOH) Interventions    Readmission Risk Interventions No flowsheet data found.

## 2020-08-25 NOTE — TOC Progression Note (Signed)
Transition of Care [facility]) - Progression Note    Patient Details  Name: PANAYIOTIS [place]IN[place]ILLE MRN: [id number] Date of Birth: 12/12/1940  Transition of Care [facility]) CM/SW Contact  Purcell Mouton, RN Phone Number: 08/25/2020, 3:13 PM  Clinical Narrative:    Corey Harold was called. RN is aware.    Expected Discharge Plan: Skilled Nursing Facility Barriers to Discharge: [place] (PASRR),SNF Pending bed offer,SNF Pending discharge orders,Insurance Authorization,Continued Medical Work up  Expected Discharge Plan and Services Expected Discharge Plan: [place] In-house Referral: Clinical Social Work   Post Acute Care Choice: [place] Living arrangements for the past 2 months: [place] ([place]) Expected Discharge Date: 08/25/20                                     Social Determinants of Health (SDOH) Interventions    Readmission Risk Interventions No flowsheet data found.

## 2020-08-25 NOTE — Progress Notes (Signed)
Physical Therapy Treatment Patient Details Name: Fernando Carr MRN: [id number] DOB: January 13, 1941 Today's Date: 08/25/2020    History of Present Illness Pt admitted from [facility] s/p fall with R hip fx and now s/p R THR by anterior direct approach.  Pt with hx of CKD, Parkinsons, bipolar, AAA, and A-fib    PT Comments    POD # 11 Pt progressing well with his hip surgery.  Assisted OOB to amb to bathroom.  General bed mobility comments: pt was self able to transition to EOB on his own lifting R LE with assist from L LE and increased time to complete scooting to EOB. General transfer comment: 25% VC's on proper hand placement esp with stand to sit on elevated commode seat.  Increased time.  Slow but steady.  Poor forward flex posture and slow transition.  Also assisted on/off elevtaed toilet seat with 50% VC's on proper hand placement.  Assisted with peri care after void/BM as pt was unable to maintain a safe standing balance and perform self task. General Gait Details: 25% VC's on proper walker to self distance and safety with turns.  Slow but steady.  Poor forward flex posture.  Delayed corrective reaction.  Still a HIGH FALL RISK but progressing.  Amb to and from bathroom then to recliner.  AVG RA was 99% with no COVID signs (no coughing, no dyspnea) and only mild c/o fatigue. Assisted to recliner and positioned to comfort.   Pt plans to D/C to SNF for ST Rehab before [facility].   Follow Up Recommendations  SNF     Equipment Recommendations  Rolling walker with 5" wheels    Recommendations for Other Services       Precautions / Restrictions Precautions Precautions: Fall Precaution Comments: none Restrictions Weight Bearing Restrictions: No RLE Weight Bearing: Weight bearing as tolerated    Mobility  Bed Mobility Overal bed mobility: Modified Independent             General bed mobility comments: pt was self able to transition to EOB on his  own lifting R LE with assist from L LE and increased time to complete scooting to EOB.  Transfers Overall transfer level: Needs assistance Equipment used: Rolling walker (2 wheeled) Transfers: Sit to/from [facility] Sit to Stand: Min guard Stand pivot transfers: Min guard;Min assist       General transfer comment: 25% VC's on proper hand placement esp with stand to sit on elevated commode seat.  Increased time.  Slow but steady.  Poor forward flex posture and slow transition.  Also assisted on/off elevtaed toilet seat with 50% VC's on proper hand placement.  Assisted with peri care after void/BM as pt was unable to maintain a safe standing balance and perform self task.  Ambulation/Gait Ambulation/Gait assistance: Min guard;Min assist Gait Distance (Feet): 25 Feet Assistive device: Rolling walker (2 wheeled) Gait Pattern/deviations: Step-to pattern;Decreased step length - right;Decreased step length - left;Shuffle;Trunk flexed;Wide base of support Gait velocity: decreased   General Gait Details: 25% VC's on proper walker to self distance and safety with turns.  Slow but steady.  Poor forward flex posture.  Delayed corrective reaction.  Still a HIGH FALL RISK but progressing.  Amb to and from bathroom then to recliner.  AVG RA was 99% with no COVID signs (no coughing, no dyspnea) and only mild c/o fatigue.   Stairs             Emergency planning/management officer  Modified Rankin (Stroke Patients Only)       Balance                                            Cognition Arousal/Alertness: Awake/alert Behavior During Therapy: WFL for tasks assessed/performed Overall Cognitive Status: Within Functional Limits for tasks assessed                                 General Comments: AxO x 3 very pleasant and HOH  cute      Exercises      General Comments        Pertinent Vitals/Pain Pain Assessment: Faces Faces Pain Scale: Hurts a  little bit Pain Location: R hip with activity Pain Descriptors / Indicators: Grimacing Pain Intervention(s): Monitored during session;Repositioned    Home Living                      Prior Function            PT Goals (current goals can now be found in the care plan section) Progress towards PT goals: Progressing toward goals    Frequency    Min 3X/week      PT Plan Current plan remains appropriate    Co-evaluation              AM-PAC PT "6 Clicks" Mobility   Outcome Measure  Help needed turning from your back to your side while in a flat bed without using bedrails?: None Help needed moving from lying on your back to sitting on the side of a flat bed without using bedrails?: A Little Help needed moving to and from a bed to a chair (including a wheelchair)?: A Little Help needed standing up from a chair using your arms (e.g., wheelchair or bedside chair)?: A Little Help needed to walk in hospital room?: A Little Help needed climbing 3-5 steps with a railing? : A Lot 6 Click Score: 18    End of Session Equipment Utilized During Treatment: Gait belt Activity Tolerance: Patient tolerated treatment well Patient left: in chair;with call bell/phone within reach;with chair alarm set Nurse Communication: Mobility status (notified NT pt had a large BM) PT Visit Diagnosis: Difficulty in walking, not elsewhere classified (R26.2)     Time: 1020-1050 PT Time Calculation (min) (ACUTE ONLY): 30 min  Charges:  $Gait Training: 8-22 mins $Therapeutic Activity: 8-22 mins                     Rica Koyanagi  PTA Acute  Rehabilitation Services Pager      [phone] Office      [phone]

## 2020-08-25 NOTE — Plan of Care (Signed)
  Problem: Activity: Goal: Ability to avoid complications of mobility impairment will improve Outcome: Adequate for Discharge   Problem: Pain Management: Goal: Pain level will decrease with appropriate interventions Outcome: Adequate for Discharge   Problem: Skin Integrity: Goal: Will show signs of wound healing Outcome: Adequate for Discharge

## 2020-08-25 NOTE — Progress Notes (Signed)
PROGRESS NOTE  Fernando Carr  WUJ:[id number] DOB: Apr 04, 1941 DOA: 08/13/2020 PCP: [name], MD   Brief Narrative: Fernando Carr is a 80 y.o. male with a history of AFib on eliquis, stage IIIa CKD, PD, AAA s/p repair, severe LVH, HLD, and bipolar disorder who presented on 1/13 from [facility] with right hip pain found to have a displaced right proximal femur fracture and subsequently had THA 1/14. Postoperative course included anemia requiring 1u PRBCs each on 1/15, 1/17, and 1/24. Plan was for SNF discharge, though screening SARS-CoV-2 PCR was positive. Pt is triple vaccinated and tested negative on admission. He has now completed 5 days of isolation per CDC guidelines for a fully vaccinated asymptomatic positive case. SNF disposition is pending.  Assessment & Plan: Principal Problem:   Hip fracture ([place]) Active Problems:   CRD (chronic renal disease), stage III ([facility])   MDD (major depressive disorder), recurrent severe, without psychosis ([place])   Syncope   AKI (acute kidney injury) ([place])   LVH (left ventricular hypertrophy)  Proximal right femur fracture s/p right THA 08/14/2020:  - Pain control - Orthopedics follow up, WBAT.  - Continue PT/OT at SNF. OOB daily ordered. - On anticoagulation as below  Constipation:  - Continue bowel regimen with opioids, having BMs.  Breakthrough asymptomatic covid-19 infection: + 1/18 on screening test. No symptoms in pt vaccinated x3.  - No current Tx indicated. 10 days isolation.  Acute postoperative blood loss anemia on anemia of chronic disease: s/p 3u PRBCs total, last 1/24. - H/H up as anticipated. Still no bleeding.   - Empiric iron supplement  AKI on stage IIIa CKD: Improved.  - Avoid nephrotoxins  PAF, LVH:  - Continue eliquis - Continue cardiology follow up.   Bipolar disorder: Quiescent.  - Continue risperidone, SSRI  PD:  - Continue sinemet  Acute metabolic encephalopathy: Resolved.   Vitamin B12 deficiency:  -  Supplement  Folic acid deficiency:  - Supplement  Fall, possible syncope:  - Continue telemetry  Obesity: Estimated body mass index is 31.19 kg/m as calculated from the following:   Height as of this encounter: 6' (1.829 m).   Weight as of this encounter: 104.3 kg.  DVT prophylaxis: Eliquis Code Status: DNR Family Communication: None at bedside Disposition Plan:  Status is: Inpatient  Remains inpatient appropriate because:Unsafe d/c plan  Dispo: The patient is from: ILF              Anticipated d/c is to: SNF                Anticipated d/c date is: > 3 days Patient is stable to be discharged if bed becomes available              Patient currently is medically stable to d/c.  Consultants:   Orthopedics  Procedures:   Right THA 08/14/2020  Antimicrobials:  None   Subjective: No new complaints. Asked for PT reevaluation which confirms he still requires SNF at discharge. Confirmed with ID he needs 10 days isolation.  Objective: Vitals:   08/24/20 2207 08/25/20 0502 08/25/20 1205 08/25/20 1212  BP: (!) 159/78 (!) 158/67 (!) 86/52 (!) 97/59  Pulse: 66 (!) 51 (!) 57   Resp: 14 20 20    Temp: 98.2 F (36.8 C) 97.7 F (36.5 C) 98 F (36.7 C)   TempSrc: Oral Oral Oral   SpO2: 97%  94%   Weight:      Height:  Intake/Output Summary (Last 24 hours) at 08/25/2020 1236 Last data filed at 08/25/2020 1207 Gross per 24 hour  Intake 985 ml  Output 2625 ml  Net -1640 ml   Filed Weights   08/13/20 0956 08/14/20 1708 08/20/20 0544  Weight: 102.1 kg 102 kg 104.3 kg   Gen: 80 y.o. male in no distress Pulm: Nonlabored breathing room air. Clear. CV: Regular rate and rhythm. No murmur, rub, or gallop. No JVD, no dependent edema. GI: Abdomen soft, non-tender, non-distended, with normoactive bowel sounds.  Ext: Warm, no deformities. RLE no deformity or visible abnormality.  Skin: No rashes, lesions or ulcers on visualized skin. Neuro: Alert and oriented. No focal  neurological deficits. Psych: Judgement and insight appear fair. Mood euthymic & affect congruent. Behavior is appropriate.    Data Reviewed: I have personally reviewed following labs and imaging studies  CBC: Recent Labs  Lab 08/19/20 0447 08/20/20 0514 08/21/20 0456 08/23/20 0445 08/24/20 0443 08/25/20 0456  WBC 4.8 5.1  --   --  5.9  --   NEUTROABS  --  3.4  --   --   --   --   HGB 7.4* 7.6* 7.4* 7.3* 7.1* 8.5*  HCT 23.6* 23.9* 24.2* 23.0* 22.4* 27.1*  MCV 96.3 96.4  --   --  94.9  --   PLT 173 176  --   --  269  --    Basic Metabolic Panel: Recent Labs  Lab 08/19/20 0447 08/20/20 0514 08/21/20 0456 08/23/20 0445  NA 138 137 137 136  K 4.7 4.6 4.5 4.1  CL 107 104 106 107  CO2 23 26 22 24   GLUCOSE 102* 97 103* 90  BUN 41* 43* 42* 33*  CREATININE 1.00 1.39* 1.43* 1.11  CALCIUM 10.4* 10.4* 10.2 9.7  MG  --   --  2.0  --   PHOS  --   --  2.9  --    GFR: Estimated Creatinine Clearance: 67.4 mL/min (by C-G formula based on SCr of 1.11 mg/dL). Liver Function Tests: Recent Labs  Lab 08/20/20 0514 08/21/20 0456  AST 38  --   ALT 14  --   ALKPHOS 61  --   BILITOT 0.4  --   PROT 6.1*  --   ALBUMIN 3.0* 3.0*   No results for input(s): LIPASE, AMYLASE in the last 168 hours. No results for input(s): AMMONIA in the last 168 hours. Coagulation Profile: No results for input(s): INR, PROTIME in the last 168 hours. Cardiac Enzymes: No results for input(s): CKTOTAL, CKMB, CKMBINDEX, TROPONINI in the last 168 hours. BNP (last 3 results) No results for input(s): PROBNP in the last 8760 hours. HbA1C: No results for input(s): HGBA1C in the last 72 hours. CBG: Recent Labs  Lab 08/19/20 0640 08/20/20 0540 08/21/20 0612 08/22/20 0519 08/25/20 0458  GLUCAP 90 90 91 77 86   Lipid Profile: No results for input(s): CHOL, HDL, LDLCALC, TRIG, CHOLHDL, LDLDIRECT in the last 72 hours. Thyroid Function Tests: No results for input(s): TSH, T4TOTAL, FREET4, T3FREE, THYROIDAB  in the last 72 hours. Anemia Panel: No results for input(s): VITAMINB12, FOLATE, FERRITIN, TIBC, IRON, RETICCTPCT in the last 72 hours. Urine analysis:    Component Value Date/Time   COLORURINE YELLOW 09/26/2019 1939   APPEARANCEUR CLEAR 09/26/2019 1939   LABSPEC 1.020 09/26/2019 1939   PHURINE 6.5 09/26/2019 1939   GLUCOSEU NEGATIVE 09/26/2019 1939   [place] NEGATIVE 09/26/2019 [place] NEGATIVE 09/26/2019 [place] NEGATIVE 09/26/2019 1939  PROTEINUR 30 (A) 09/26/2019 1939   UROBILINOGEN 1.0 08/19/2014 1745   NITRITE NEGATIVE 09/26/2019 1939   LEUKOCYTESUR NEGATIVE 09/26/2019 1939   Recent Results (from the past 240 hour(s))  SARS CORONAVIRUS 2 (TAT 6-24 HRS) Nasopharyngeal Nasopharyngeal Swab     Status: Abnormal   Collection Time: 08/18/20 11:55 AM   Specimen: Nasopharyngeal Swab  Result Value Ref Range Status   SARS Coronavirus 2 POSITIVE (A) NEGATIVE Final    Comment: (NOTE) SARS-CoV-2 target nucleic acids are DETECTED.  The SARS-CoV-2 RNA is generally detectable in upper and lower respiratory specimens during the acute phase of infection. Positive results are indicative of the presence of SARS-CoV-2 RNA. Clinical correlation with patient history and other diagnostic information is  necessary to determine patient infection status. Positive results do not rule out bacterial infection or co-infection with other viruses.  The expected result is Negative.  Fact Sheet for Patients: SugarRoll.be  Fact Sheet for Healthcare Providers: https://www.woods-mathews.com/  This test is not yet approved or cleared by the [place] FDA and  has been authorized for detection and/or diagnosis of SARS-CoV-2 by FDA under an Emergency Use Authorization (EUA). This EUA will remain  in effect (meaning this test can be used) for the duration of the COVID-19 declaration under Section 564(b)(1) of the Act, 21 U. S.C. section  360bbb-3(b)(1), unless the authorization is terminated or revoked sooner.   Performed at [facility], [place] [address], [place], [place] [address]   Resp panel by RT-PCR (RSV, Flu A&B, Covid)     Status: Abnormal   Collection Time: 08/18/20 11:30 PM  Result Value Ref Range Status   SARS Coronavirus 2 by RT PCR POSITIVE (A) NEGATIVE Final    Comment: CRITICAL RESULT CALLED TO, READ BACK BY AND VERIFIED WITH: RN K CUMINGS AT 0056 08/19/20 CRUICK[place]ANK A (NOTE) SARS-CoV-2 target nucleic acids are DETECTED.  The SARS-CoV-2 RNA is generally detectable in upper respiratory specimens during the acute phase of infection. Positive results are indicative of the presence of the identified virus, but do not rule out bacterial infection or co-infection with other pathogens not detected by the test. Clinical correlation with patient history and other diagnostic information is necessary to determine patient infection status. The expected result is Negative.  Fact Sheet for Patients: EntrepreneurPulse.com.au  Fact Sheet for Healthcare Providers: IncredibleEmployment.be  This test is not yet approved or cleared by the [place] FDA and  has been authorized for detection and/or diagnosis of SARS-CoV-2 by FDA under an Emergency Use Authorization (EUA).  This EUA will remain in effect (meanin g this test can be used) for the duration of  the COVID-19 declaration under Section 564(b)(1) of the Act, 21 U.S.C. section 360bbb-3(b)(1), unless the authorization is terminated or revoked sooner.     Influenza A by PCR NEGATIVE NEGATIVE Final   Influenza B by PCR NEGATIVE NEGATIVE Final    Comment: (NOTE) The Xpert Xpress SARS-CoV-2/FLU/RSV plus assay is intended as an aid in the diagnosis of influenza from Nasopharyngeal swab specimens and should not be used as a sole basis for treatment. Nasal washings and aspirates are unacceptable for Xpert Xpress  SARS-CoV-2/FLU/RSV testing.  Fact Sheet for Patients: EntrepreneurPulse.com.au  Fact Sheet for Healthcare Providers: IncredibleEmployment.be  This test is not yet approved or cleared by the [place] FDA and has been authorized for detection and/or diagnosis of SARS-CoV-2 by FDA under an Emergency Use Authorization (EUA). This EUA will remain in effect (meaning this test can be used) for  the duration of the COVID-19 declaration under Section 564(b)(1) of the Act, 21 U.S.C. section 360bbb-3(b)(1), unless the authorization is terminated or revoked.     Resp Syncytial Virus by PCR NEGATIVE NEGATIVE Final    Comment: (NOTE) Fact Sheet for Patients: EntrepreneurPulse.com.au  Fact Sheet for Healthcare Providers: IncredibleEmployment.be  This test is not yet approved or cleared by the [place] FDA and has been authorized for detection and/or diagnosis of SARS-CoV-2 by FDA under an Emergency Use Authorization (EUA). This EUA will remain in effect (meaning this test can be used) for the duration of the COVID-19 declaration under Section 564(b)(1) of the Act, 21 U.S.C. section 360bbb-3(b)(1), unless the authorization is terminated or revoked.  Performed at [facility], [place] [name], [place], [place] [address]       Radiology Studies: No results found.  Scheduled Meds: . apixaban  5 mg Oral BID  . carbidopa-levodopa  1 tablet Oral TID AC  . cyanocobalamin  1,000 mcg Intramuscular Daily  . docusate sodium  100 mg Oral BID  . ferrous sulfate  325 mg Oral Q breakfast  . folic acid  1 mg Oral Daily  . mouth rinse  15 mL Mouth Rinse BID  . polyethylene glycol  17 g Oral Daily  . risperiDONE  0.5 mg Oral QHS  . senna-docusate  2 tablet Oral BID  . sertraline  50 mg Oral Daily   Continuous Infusions:    LOS: 12 days   Time spent: 25 minutes.  [name], MD [facility] [email or web address] 08/25/2020, 12:36 PM

## 2020-08-25 NOTE — Discharge Summary (Signed)
Physician Discharge Summary  Fernando Carr QVZ:[id number] DOB: [birth year] DOA: 08/13/2020  PCP: [name], MD  Admit date: 08/13/2020 Discharge date: 08/25/2020  Admitted From: Home Disposition: SNF   Recommendations for Outpatient Follow-up:  1. Follow up with PCP, cardiology, orthopedics. 2. Please obtain BMP/CBC in one week  Home Health: N/A Equipment/Devices: Per SNF Discharge Condition: Stable CODE STATUS: DNR Diet recommendation: Heart healthy  Brief/Interim Summary: Fernando Carr is a 80 y.o. male with a history of AFib on eliquis, stage IIIa CKD, PD, AAA s/p repair, severe LVH, HLD, and bipolar disorder who presented on 1/13 from [place] with right hip pain found to have a displaced right proximal femur fracture and subsequently had THA 1/14. Postoperative course included anemia requiring 1u PRBCs each on 1/15, 1/17, and 1/24. Plan was for SNF discharge, though screening SARS-CoV-2 PCR was positive. Pt is triple vaccinated and tested negative on admission. He has now completed 5 days of isolation per CDC guidelines for a fully vaccinated asymptomatic positive case. SNF disposition is pending.  Discharge Diagnoses:  Principal Problem:   Hip fracture ([place]) Active Problems:   CRD (chronic renal disease), stage III (H[facility])   MDD (major depressive disorder), recurrent severe, without psychosis ([place])   Syncope   AKI (acute kidney injury) ([place])   LVH (left ventricular hypertrophy)  Proximal right femur fracture s/p right THA 08/14/2020:  - Pain control - Orthopedics follow up, WBAT.  - Continue PT/OT at SNF. OOB daily ordered. - On anticoagulation as below  Constipation:  - Continue bowel regimen with opioids, having BMs.  Breakthrough asymptomatic covid-19 infection: + 1/18 on screening test. No symptoms in pt vaccinated x3.  - No current Tx indicated. 10 days isolation.  Acute postoperative blood loss anemia on anemia of chronic disease: s/p 3u PRBCs total,  last 1/24. - H/H up as anticipated. Still no bleeding.   - Empiric iron supplement  AKI on stage IIIa CKD: Improved.  - Avoid nephrotoxins  PAF, LVH:  - Continue eliquis - Continue cardiology follow up.   Bipolar disorder: Quiescent.  - Continue risperidone, SSRI  PD:  - Continue sinemet  Acute metabolic encephalopathy: Resolved.   Vitamin B12 deficiency:  - Supplement  Folic acid deficiency:  - Supplement  Fall, possible syncope:  - No significant dysrhythmias on telemetry  Obesity: Estimated body mass index is 31.19 kg/m as calculated from the following:   Height as of this encounter: 6' (1.829 m).   Weight as of this encounter: 104.3 kg.  Discharge Instructions  Allergies as of 08/25/2020   No Known Allergies     Medication List    TAKE these medications   apixaban 5 MG Tabs tablet Commonly known as: Eliquis Take 1 tablet (5 mg total) by mouth 2 (two) times daily.   carbidopa-levodopa 25-100 MG tablet Commonly known as: SINEMET IR Take 1 tablet by mouth 3 (three) times daily before meals.   fenofibrate 54 MG tablet Take 54 mg by mouth daily.   ferrous sulfate 325 (65 FE) MG tablet Take 1 tablet (325 mg total) by mouth daily with breakfast. Start taking on: August 26, 3293   folic acid 1 MG tablet Commonly known as: FOLVITE Take 1 tablet (1 mg total) by mouth daily. Start taking on: August 26, 2020   HYDROcodone-acetaminophen 5-325 MG tablet Commonly known as: NORCO/VICODIN Take 1-2 tablets by mouth every 6 (six) hours as needed for moderate pain.   polyethylene glycol 17 g packet Commonly known as:  MIRALAX / GLYCOLAX Take 17 g by mouth daily.   risperiDONE 0.5 MG tablet Commonly known as: RisperDAL Take 1 tablet (0.5 mg total) by mouth at bedtime.   sertraline 100 MG tablet Commonly known as: ZOLOFT Take 1 tablet (100 mg total) by mouth daily.   sertraline 50 MG tablet Commonly known as: Zoloft Take 1 tablet (50 mg total) by  mouth daily. Take with Zoloft 100mg  by mouth daily.   simvastatin 10 MG tablet Commonly known as: ZOCOR Take 1 tablet (10 mg total) by mouth daily at 6 PM.   vitamin B-12 1000 MCG tablet Commonly known as: CYANOCOBALAMIN Take 1 tablet (1,000 mcg total) by mouth daily.       Contact information for follow-up providers    Swinteck, Aaron Edelman, MD. Schedule an appointment as soon as possible for a visit in 2 weeks.   Specialty: Orthopedic Surgery Why: For wound re-check, For suture removal Contact information: [address] [place] [address] [phone]        [name], MD Follow up.   Specialty: Internal Medicine Contact information: [address] [place] [address] [phone]        Minus Breeding, MD .   Specialty: Cardiology Contact information: [address] [place] [address] [phone]            Contact information for after-discharge care    [facility] .   Service: Skilled Nursing Contact information: [address] [place] [phone]                 No Known Allergies  Consultations:  Orthopedics  Cardiology  Procedures/Studies: Pelvis Portable  Result Date: 08/14/2020 CLINICAL DATA:  Right total arthroplasty EXAM: PORTABLE PELVIS 1-2 VIEWS COMPARISON:  08/13/2020 right hip radiographs FINDINGS: Status post right total hip arthroplasty with well-positioned right acetabular and right proximal femoral prostheses. Skin staples lateral to the right hip. No hip dislocation. No osseous fracture. No focal osseous lesion. Degenerative changes in the visualized lower lumbar spine. Expected postsurgical soft tissue gas surrounding the right hip. IMPRESSION: Satisfactory immediate postoperative appearance status post right total hip arthroplasty. Electronically Signed   By: Ilona Sorrel M.D.   On: 08/14/2020 16:07   DG  C-Arm 1-60 Min-No Report  Result Date: 08/14/2020 Fluoroscopy was utilized by the requesting physician.  No radiographic interpretation.   DG Hip Port [place] W or [place] Pelvis 1 View Right  Result Date: 08/13/2020 CLINICAL DATA:  Right hip pain after fall. EXAM: DG HIP (WITH OR WITHOUT PELVIS) 1V PORT RIGHT COMPARISON:  None. FINDINGS: Mildly displaced fracture is seen involving the proximal right femoral neck. IMPRESSION: Mildly displaced proximal right femoral neck fracture. Electronically Signed   By: Marijo Conception M.D.   On: 08/13/2020 10:41   DG HIP OPERATIVE UNILAT W OR W/O PELVIS RIGHT  Result Date: 08/14/2020 CLINICAL DATA:  Right total hip arthroplasty. EXAM: OPERATIVE RIGHT HIP (WITH PELVIS IF PERFORMED) 2 VIEWS TECHNIQUE: Fluoroscopic spot image(s) were submitted for interpretation post-operatively. COMPARISON:  August 13, 2020. FINDINGS: Fluoro time: 13.3 seconds.  Reported radiation: 2.1442 mGy. Two C-arm fluoroscopic images were obtained intraoperatively and submitted for post operative interpretation. These images demonstrate postsurgical changes of right total hip arthroplasty. No unexpected findings. Please see the performing provider's procedural report for further detail. IMPRESSION: Intraoperative fluoroscopy, as detailed above. Electronically Signed   By: Margaretha Sheffield MD  On: 08/14/2020 14:50    Subjective: No new complaints. Asked for PT reevaluation which confirms he still requires SNF at discharge. Confirmed with ID he needs 10 days isolation.  Discharge Exam: Vitals:   08/25/20 1205 08/25/20 1212  BP: (!) 86/52 (!) 97/59  Pulse: (!) 57   Resp: 20   Temp: 98 F (36.7 C)   SpO2: 94%    Gen: 80 y.o. male in no distress Pulm: Nonlabored breathing room air. Clear. CV: Regular rate and rhythm. No murmur, rub, or gallop. No JVD, no dependent edema. GI: Abdomen soft, non-tender, non-distended, with normoactive bowel sounds.  Ext: Warm, no deformities. RLE no  deformity or visible abnormality.  Skin: No rashes, lesions or ulcers on visualized skin. Neuro: Alert and oriented. No focal neurological deficits. Psych: Judgement and insight appear fair. Mood euthymic & affect congruent. Behavior is appropriate.    Labs: BNP (last 3 results) No results for input(s): BNP in the last 8760 hours. Basic Metabolic Panel: Recent Labs  Lab 08/19/20 0447 08/20/20 0514 08/21/20 0456 08/23/20 0445  NA 138 137 137 136  K 4.7 4.6 4.5 4.1  CL 107 104 106 107  CO2 23 26 22 24   GLUCOSE 102* 97 103* 90  BUN 41* 43* 42* 33*  CREATININE 1.00 1.39* 1.43* 1.11  CALCIUM 10.4* 10.4* 10.2 9.7  MG  --   --  2.0  --   PHOS  --   --  2.9  --    Liver Function Tests: Recent Labs  Lab 08/20/20 0514 08/21/20 0456  AST 38  --   ALT 14  --   ALKPHOS 61  --   BILITOT 0.4  --   PROT 6.1*  --   ALBUMIN 3.0* 3.0*   No results for input(s): LIPASE, AMYLASE in the last 168 hours. No results for input(s): AMMONIA in the last 168 hours. CBC: Recent Labs  Lab 08/19/20 0447 08/20/20 0514 08/21/20 0456 08/23/20 0445 08/24/20 0443 08/25/20 0456  WBC 4.8 5.1  --   --  5.9  --   NEUTROABS  --  3.4  --   --   --   --   HGB 7.4* 7.6* 7.4* 7.3* 7.1* 8.5*  HCT 23.6* 23.9* 24.2* 23.0* 22.4* 27.1*  MCV 96.3 96.4  --   --  94.9  --   PLT 173 176  --   --  269  --    Cardiac Enzymes: No results for input(s): CKTOTAL, CKMB, CKMBINDEX, TROPONINI in the last 168 hours. BNP: Invalid input(s): POCBNP CBG: Recent Labs  Lab 08/19/20 0640 08/20/20 0540 08/21/20 0612 08/22/20 0519 08/25/20 0458  GLUCAP 90 90 91 77 86   D-Dimer No results for input(s): DDIMER in the last 72 hours. Hgb A1c No results for input(s): HGBA1C in the last 72 hours. Lipid Profile No results for input(s): CHOL, HDL, LDLCALC, TRIG, CHOLHDL, LDLDIRECT in the last 72 hours. Thyroid function studies No results for input(s): TSH, T4TOTAL, T3FREE, THYROIDAB in the last 72 hours.  Invalid  input(s): FREET3 Anemia work up No results for input(s): VITAMINB12, FOLATE, FERRITIN, TIBC, IRON, RETICCTPCT in the last 72 hours. Urinalysis    Component Value Date/Time   COLORURINE YELLOW 09/26/2019 1939   APPEARANCEUR CLEAR 09/26/2019 1939   LABSPEC 1.020 09/26/2019 1939   PHURINE 6.5 09/26/2019 1939   GLUCOSEU NEGATIVE 09/26/2019 1939   [place] NEGATIVE 09/26/2019 1939   BILIRUBINUR NEGATIVE 09/26/2019 [place] NEGATIVE 09/26/2019 1939   PROTEINUR 30 (  A) 09/26/2019 1939   UROBILINOGEN 1.0 08/19/2014 1745   NITRITE NEGATIVE 09/26/2019 1939   LEUKOCYTESUR NEGATIVE 09/26/2019 1939    Microbiology Recent Results (from the past 240 hour(s))  SARS CORONAVIRUS 2 (TAT 6-24 HRS) Nasopharyngeal Nasopharyngeal Swab     Status: Abnormal   Collection Time: 08/18/20 11:55 AM   Specimen: Nasopharyngeal Swab  Result Value Ref Range Status   SARS Coronavirus 2 POSITIVE (A) NEGATIVE Final    Comment: (NOTE) SARS-CoV-2 target nucleic acids are DETECTED.  The SARS-CoV-2 RNA is generally detectable in upper and lower respiratory specimens during the acute phase of infection. Positive results are indicative of the presence of SARS-CoV-2 RNA. Clinical correlation with patient history and other diagnostic information is  necessary to determine patient infection status. Positive results do not rule out bacterial infection or co-infection with other viruses.  The expected result is Negative.  Fact Sheet for Patients: SugarRoll.be  Fact Sheet for Healthcare Providers: https://www.woods-mathews.com/  This test is not yet approved or cleared by the [place] FDA and  has been authorized for detection and/or diagnosis of SARS-CoV-2 by FDA under an Emergency Use Authorization (EUA). This EUA will remain  in effect (meaning this test can be used) for the duration of the COVID-19 declaration under Section 564(b)(1) of the Act, 21 U. S.C. section  360bbb-3(b)(1), unless the authorization is terminated or revoked sooner.   Performed at [facility], [place] [address] [place], [place] [address]   Resp panel by RT-PCR (RSV, Flu A&B, Covid)     Status: Abnormal   Collection Time: 08/18/20 11:30 PM  Result Value Ref Range Status   SARS Coronavirus 2 by RT PCR POSITIVE (A) NEGATIVE Final    Comment: CRITICAL RESULT CALLED TO, READ BACK BY AND VERIFIED WITH: RN K CUMINGS AT 0056 08/19/20 [place] A (NOTE) SARS-CoV-2 target nucleic acids are DETECTED.  The SARS-CoV-2 RNA is generally detectable in upper respiratory specimens during the acute phase of infection. Positive results are indicative of the presence of the identified virus, but do not rule out bacterial infection or co-infection with other pathogens not detected by the test. Clinical correlation with patient history and other diagnostic information is necessary to determine patient infection status. The expected result is Negative.  Fact Sheet for Patients: EntrepreneurPulse.com.au  Fact Sheet for Healthcare Providers: IncredibleEmployment.be  This test is not yet approved or cleared by the [place] FDA and  has been authorized for detection and/or diagnosis of SARS-CoV-2 by FDA under an Emergency Use Authorization (EUA).  This EUA will remain in effect (meanin g this test can be used) for the duration of  the COVID-19 declaration under Section 564(b)(1) of the Act, 21 U.S.C. section 360bbb-3(b)(1), unless the authorization is terminated or revoked sooner.     Influenza A by PCR NEGATIVE NEGATIVE Final   Influenza B by PCR NEGATIVE NEGATIVE Final    Comment: (NOTE) The Xpert Xpress SARS-CoV-2/FLU/RSV plus assay is intended as an aid in the diagnosis of influenza from Nasopharyngeal swab specimens and should not be used as a sole basis for treatment. Nasal washings and aspirates are unacceptable for Xpert Xpress  SARS-CoV-2/FLU/RSV testing.  Fact Sheet for Patients: EntrepreneurPulse.com.au  Fact Sheet for Healthcare Providers: IncredibleEmployment.be  This test is not yet approved or cleared by the [place] FDA and has been authorized for detection and/or diagnosis of SARS-CoV-2 by FDA under an Emergency Use Authorization (EUA). This EUA will remain in effect (meaning this test can be used) for  the duration of the COVID-19 declaration under Section 564(b)(1) of the Act, 21 U.S.C. section 360bbb-3(b)(1), unless the authorization is terminated or revoked.     Resp Syncytial Virus by PCR NEGATIVE NEGATIVE Final    Comment: (NOTE) Fact Sheet for Patients: EntrepreneurPulse.com.au  Fact Sheet for Healthcare Providers: IncredibleEmployment.be  This test is not yet approved or cleared by the [place] FDA and has been authorized for detection and/or diagnosis of SARS-CoV-2 by FDA under an Emergency Use Authorization (EUA). This EUA will remain in effect (meaning this test can be used) for the duration of the COVID-19 declaration under Section 564(b)(1) of the Act, 21 U.S.C. section 360bbb-3(b)(1), unless the authorization is terminated or revoked.  Performed at [facility], [place] [address] [place], [place] [address]     Time coordinating discharge: Approximately 40 minutes  [name], MD  [facility] 08/25/2020, 1:54 PM

## 2020-08-25 NOTE — Plan of Care (Signed)
  Problem: Activity: Goal: Ability to avoid complications of mobility impairment will improve Outcome: Progressing   Problem: Pain Management: Goal: Pain level will decrease with appropriate interventions Outcome: Progressing   Problem: Skin Integrity: Goal: Will show signs of wound healing Outcome: Progressing   

## 2020-08-26 DIAGNOSIS — N189 Chronic kidney disease, unspecified: Secondary | ICD-10-CM | POA: Diagnosis not present

## 2020-08-26 DIAGNOSIS — E538 Deficiency of other specified B group vitamins: Secondary | ICD-10-CM | POA: Diagnosis not present

## 2020-08-26 DIAGNOSIS — R410 Disorientation, unspecified: Secondary | ICD-10-CM | POA: Diagnosis not present

## 2020-08-26 DIAGNOSIS — N183 Chronic kidney disease, stage 3 unspecified: Secondary | ICD-10-CM | POA: Diagnosis not present

## 2020-08-26 DIAGNOSIS — Z7401 Bed confinement status: Secondary | ICD-10-CM | POA: Diagnosis not present

## 2020-08-26 DIAGNOSIS — I480 Paroxysmal atrial fibrillation: Secondary | ICD-10-CM | POA: Diagnosis not present

## 2020-08-26 DIAGNOSIS — D62 Acute posthemorrhagic anemia: Secondary | ICD-10-CM | POA: Diagnosis not present

## 2020-08-26 DIAGNOSIS — I517 Cardiomegaly: Secondary | ICD-10-CM | POA: Diagnosis not present

## 2020-08-26 DIAGNOSIS — E7800 Pure hypercholesterolemia, unspecified: Secondary | ICD-10-CM | POA: Diagnosis not present

## 2020-08-26 DIAGNOSIS — D649 Anemia, unspecified: Secondary | ICD-10-CM | POA: Diagnosis not present

## 2020-08-26 DIAGNOSIS — R69 Illness, unspecified: Secondary | ICD-10-CM | POA: Diagnosis not present

## 2020-08-26 DIAGNOSIS — N179 Acute kidney failure, unspecified: Secondary | ICD-10-CM | POA: Diagnosis not present

## 2020-08-26 DIAGNOSIS — R55 Syncope and collapse: Secondary | ICD-10-CM | POA: Diagnosis not present

## 2020-08-26 DIAGNOSIS — Z471 Aftercare following joint replacement surgery: Secondary | ICD-10-CM | POA: Diagnosis not present

## 2020-08-26 DIAGNOSIS — S72001D Fracture of unspecified part of neck of right femur, subsequent encounter for closed fracture with routine healing: Secondary | ICD-10-CM | POA: Diagnosis not present

## 2020-08-26 DIAGNOSIS — U071 COVID-19: Secondary | ICD-10-CM | POA: Diagnosis not present

## 2020-08-26 DIAGNOSIS — M2550 Pain in unspecified joint: Secondary | ICD-10-CM | POA: Diagnosis not present

## 2020-08-26 DIAGNOSIS — M6281 Muscle weakness (generalized): Secondary | ICD-10-CM | POA: Diagnosis not present

## 2020-08-26 DIAGNOSIS — Z743 Need for continuous supervision: Secondary | ICD-10-CM | POA: Diagnosis not present

## 2020-08-26 DIAGNOSIS — I10 Essential (primary) hypertension: Secondary | ICD-10-CM | POA: Diagnosis not present

## 2020-08-26 LAB — HEMOGLOBIN AND HEMATOCRIT, BLOOD
HCT: 28.9 % — ABNORMAL LOW (ref 39.0–52.0)
Hemoglobin: 9.1 g/dL — ABNORMAL LOW (ref 13.0–17.0)

## 2020-08-26 LAB — GLUCOSE, CAPILLARY: Glucose-Capillary: 86 mg/dL (ref 70–99)

## 2020-08-26 NOTE — TOC Progression Note (Signed)
Transition of Care [facility]) - Progression Note    Patient Details  Name: Fernando Carr MRN: [id number] Date of Birth: [birth year]  [facility] CM/SW Contact  Purcell Mouton, RN Phone Number: 08/26/2020, 9:52 AM  Clinical Narrative:     Corey Harold was called, RN aware.   Expected Discharge Plan: Skilled Nursing Facility Barriers to Discharge: [place] (PASRR),SNF Pending bed offer,SNF Pending discharge orders,Insurance Authorization,Continued Medical Work up  Expected Discharge Plan and Services Expected Discharge Plan: [place] In-house Referral: Clinical Social Work   Post Acute Care Choice: [place] Living arrangements for the past 2 months: [place] ([place]) Expected Discharge Date: 08/25/20                                     Social Determinants of Health (SDOH) Interventions    Readmission Risk Interventions No flowsheet data found.

## 2020-08-26 NOTE — Progress Notes (Signed)
Transport PTAR to transport to [facility]. [facility] doesnot received patients after 2300.

## 2020-08-26 NOTE — Discharge Summary (Signed)
Physician Discharge Summary  Fernando Carr [id number] DOB: 12-08-1940 DOA: 08/13/2020  PCP: [name], MD  Admit date: 08/13/2020 Discharge date: 08/26/2020  Admitted From: Home Disposition: SNF   Recommendations for Outpatient Follow-up:  1. Follow up with PCP, cardiology, orthopedics. 2. Please obtain CMP/CBC in one week 3. Follow up possible syncope outpatient - appears concern for orthostatic hypotension while here - follow outpatient  4. Quarantine per cdc guidelines.  Completed 5 days isolation, strict mask adherence for 10 days  Home Health: N/[name] Equipment/Devices: Per SNF Discharge Condition: Stable CODE STATUS: DNR Diet recommendation: Heart healthy  Brief/Interim Summary: Fernando Carr is Fernando Carr 80 y.o. male with Fernando Carr history of AFib on eliquis, stage IIIa CKD, PD, AAA s/p repair, severe LVH, HLD, and bipolar disorder who presented on 1/13 from [facility] with right hip pain found to have Fernando Carr displaced right proximal femur fracture and subsequently had THA 1/14. Postoperative course included anemia requiring 1u PRBCs each on 1/15, 1/17, and 1/24. Plan was for SNF discharge, though screening SARS-CoV-2 PCR was positive. Pt is triple vaccinated and tested negative on admission. He has now completed 5 days of isolation per CDC guidelines for Fernando Carr fully vaccinated asymptomatic positive case. D/c to snf on 1/26.  Discharge Diagnoses:  Principal Problem:   Hip fracture (Fernando Carr) Active Problems:   CRD (chronic renal disease), stage III (Fernando Carr)   MDD (major depressive disorder), recurrent severe, without psychosis (Fernando Carr)   Syncope   AKI (acute kidney injury) (Fernando Carr)   LVH (left ventricular hypertrophy)  Proximal right femur fracture s/p right THA 08/14/2020:  - Pain control - Orthopedics follow up, WBAT.  - Continue PT/OT at SNF. OOB daily ordered. - On anticoagulation as below  Constipation:  - Continue bowel regimen with opioids, having BMs.  Breakthrough asymptomatic covid-19  infection: + 1/18 on screening test. No symptoms in pt vaccinated x3.  - No current Tx indicated. Given asymptomatic, can end isolation after 5 days with strict mask adherence for 10 days.  Acute postoperative blood loss anemia on anemia of chronic disease: s/p 3u PRBCs total, last 1/24. - H/H up as anticipated. Still no bleeding.   - Empiric iron supplement  AKI on stage IIIa CKD: Improved.  - Avoid nephrotoxins  PAF, LVH:  - Continue eliquis - Continue cardiology follow up.   Bipolar disorder: Quiescent.  - Continue risperidone, SSRI  PD:  - Continue sinemet  Acute metabolic encephalopathy: Resolved.   Vitamin B12 deficiency:  - Supplement  Folic acid deficiency:  - Supplement  Fall, possible syncope:  - No significant dysrhythmias on telemetry  Obesity: Estimated body mass index is 31.19 kg/m as calculated from the following:   Height as of this encounter: 6' (1.829 m).   Weight as of this encounter: 104.3 kg.  Discharge Instructions  Allergies as of 08/26/2020   No Known Allergies     Medication List    TAKE these medications   apixaban 5 MG Tabs tablet Commonly known as: Eliquis Take 1 tablet (5 mg total) by mouth 2 (two) times daily.   carbidopa-levodopa 25-100 MG tablet Commonly known as: SINEMET IR Take 1 tablet by mouth 3 (three) times daily before meals.   fenofibrate 54 MG tablet Take 54 mg by mouth daily.   ferrous sulfate 325 (65 FE) MG tablet Take 1 tablet (325 mg total) by mouth daily with breakfast.   folic acid 1 MG tablet Commonly known as: FOLVITE Take 1 tablet (1 mg total) by mouth  daily.   HYDROcodone-acetaminophen 5-325 MG tablet Commonly known as: NORCO/VICODIN Take 1-2 tablets by mouth every 6 (six) hours as needed for moderate pain.   polyethylene glycol 17 g packet Commonly known as: MIRALAX / GLYCOLAX Take 17 g by mouth daily.   risperiDONE 0.5 MG tablet Commonly known as: RisperDAL Take 1 tablet (0.5 mg  total) by mouth at bedtime.   sertraline 100 MG tablet Commonly known as: ZOLOFT Take 1 tablet (100 mg total) by mouth daily.   sertraline 50 MG tablet Commonly known as: Zoloft Take 1 tablet (50 mg total) by mouth daily. Take with Zoloft 100mg  by mouth daily.   simvastatin 10 MG tablet Commonly known as: ZOCOR Take 1 tablet (10 mg total) by mouth daily at 6 PM.   vitamin B-12 1000 MCG tablet Commonly known as: CYANOCOBALAMIN Take 1 tablet (1,000 mcg total) by mouth daily.       Contact information for follow-up providers    Swinteck, Aaron Edelman, MD. Schedule an appointment as soon as possible for Fernando Carr visit in 2 weeks.   Specialty: Orthopedic Surgery Why: For wound re-check, For suture removal Contact information: [address] [place] [address] [phone]        [name], MD Follow up.   Specialty: Internal Medicine Contact information: [address], [address] [place] [address] [phone]        Minus Breeding, MD .   Specialty: Cardiology Contact information: [address] [place] [address] [phone]            Contact information for after-discharge care    [facility] .   Service: Skilled Nursing Contact information: [address] [place] [phone]                 No Known Allergies  Consultations:  Orthopedics  Cardiology  Procedures/Studies: Pelvis Portable  Result Date: 08/14/2020 CLINICAL DATA:  Right total arthroplasty EXAM: PORTABLE PELVIS 1-2 VIEWS COMPARISON:  08/13/2020 right hip radiographs FINDINGS: Status post right total hip arthroplasty with well-positioned right acetabular and right proximal femoral prostheses. Skin staples lateral to the right hip. No hip dislocation. No osseous fracture. No focal osseous lesion. Degenerative changes in the visualized lower lumbar spine. Expected postsurgical  soft tissue gas surrounding the right hip. IMPRESSION: Satisfactory immediate postoperative appearance status post right total hip arthroplasty. Electronically Signed   By: Ilona Sorrel M.D.   On: 08/14/2020 16:07   DG C-Arm 1-60 Min-No Report  Result Date: 08/14/2020 Fluoroscopy was utilized by the requesting physician.  No radiographic interpretation.   DG Hip [place] W or [place] Pelvis 1 View Right  Result Date: 08/13/2020 CLINICAL DATA:  Right hip pain after fall. EXAM: DG HIP (WITH OR WITHOUT PELVIS) 1V PORT RIGHT COMPARISON:  None. FINDINGS: Mildly displaced fracture is seen involving the proximal right femoral neck. IMPRESSION: Mildly displaced proximal right femoral neck fracture. Electronically Signed   By: Marijo Conception M.D.   On: 08/13/2020 10:41   DG HIP OPERATIVE UNILAT W OR W/O PELVIS RIGHT  Result Date: 08/14/2020 CLINICAL DATA:  Right total hip arthroplasty. EXAM: OPERATIVE RIGHT HIP (WITH PELVIS IF PERFORMED) 2 VIEWS TECHNIQUE: Fluoroscopic spot image(s) were submitted for interpretation post-operatively. COMPARISON:  August 13, 2020. FINDINGS: Fluoro time: 13.3 seconds.  Reported radiation: 2.1442 mGy. Two C-arm fluoroscopic images were obtained intraoperatively and submitted for post operative interpretation. These images demonstrate postsurgical changes  of right total hip arthroplasty. No unexpected findings. Please see the performing provider's procedural report for further detail. IMPRESSION: Intraoperative fluoroscopy, as detailed above. Electronically Signed   By: Margaretha Sheffield MD   On: 08/14/2020 14:50    Subjective: No complaints today  Discharge Exam: Vitals:   08/25/20 2243 08/26/20 0451  BP: 129/71 (!) 160/67  Pulse: (!) 55 (!) 59  Resp: 20 20  Temp: 98.5 F (36.9 C) 98 F (36.7 C)  SpO2: 95% 95%   General: No acute distress. Cardiovascular: RRR Lungs: unlabored Abdomen: Soft, nontender, nondistended Neurological: Alert and oriented 3. Moves all  extremities 4. Cranial nerves II through XII grossly intact. Skin: Warm and dry. No rashes or lesions. Extremities: RLE dressing intact  Labs: BNP (last 3 results) No results for input(s): BNP in the last 8760 hours. Basic Metabolic Panel: Recent Labs  Lab 08/20/20 0514 08/21/20 0456 08/23/20 0445  NA 137 137 136  K 4.6 4.5 4.1  CL 104 106 107  CO2 26 22 24   GLUCOSE 97 103* 90  BUN 43* 42* 33*  CREATININE 1.39* 1.43* 1.11  CALCIUM 10.4* 10.2 9.7  MG  --  2.0  --   PHOS  --  2.9  --    Liver Function Tests: Recent Labs  Lab 08/20/20 0514 08/21/20 0456  AST 38  --   ALT 14  --   ALKPHOS 61  --   BILITOT 0.4  --   PROT 6.1*  --   ALBUMIN 3.0* 3.0*   No results for input(s): LIPASE, AMYLASE in the last 168 hours. No results for input(s): AMMONIA in the last 168 hours. CBC: Recent Labs  Lab 08/20/20 0514 08/21/20 0456 08/23/20 0445 08/24/20 0443 08/25/20 0456 08/26/20 0451  WBC 5.1  --   --  5.9  --   --   NEUTROABS 3.4  --   --   --   --   --   HGB 7.6* 7.4* 7.3* 7.1* 8.5* 9.1*  HCT 23.9* 24.2* 23.0* 22.4* 27.1* 28.9*  MCV 96.4  --   --  94.9  --   --   PLT 176  --   --  269  --   --    Cardiac Enzymes: No results for input(s): CKTOTAL, CKMB, CKMBINDEX, TROPONINI in the last 168 hours. BNP: Invalid input(s): POCBNP CBG: Recent Labs  Lab 08/20/20 0540 08/21/20 0612 08/22/20 0519 08/25/20 0458 08/26/20 0433  GLUCAP 90 91 77 86 86   D-Dimer No results for input(s): DDIMER in the last 72 hours. Hgb A1c No results for input(s): HGBA1C in the last 72 hours. Lipid Profile No results for input(s): CHOL, HDL, LDLCALC, TRIG, CHOLHDL, LDLDIRECT in the last 72 hours. Thyroid function studies No results for input(s): TSH, T4TOTAL, T3FREE, THYROIDAB in the last 72 hours.  Invalid input(s): FREET3 Anemia work up No results for input(s): VITAMINB12, FOLATE, FERRITIN, TIBC, IRON, RETICCTPCT in the last 72 hours. Urinalysis    Component Value Date/Time    COLORURINE YELLOW 09/26/2019 1939   APPEARANCEUR CLEAR 09/26/2019 1939   LABSPEC 1.020 09/26/2019 1939   PHURINE 6.5 09/26/2019 1939   GLUCOSEU NEGATIVE 09/26/2019 1939   [place] NEGATIVE 09/26/2019 [place] NEGATIVE 09/26/2019 [place] NEGATIVE 09/26/2019 1939   PROTEINUR 30 (Nichol Ator) 09/26/2019 1939   UROBILINOGEN 1.0 08/19/2014 1745   NITRITE NEGATIVE 09/26/2019 1939   LEUKOCYTESUR NEGATIVE 09/26/2019 1939    Microbiology Recent Results (from the past 240 hour(s))  SARS  CORONAVIRUS 2 (TAT 6-24 HRS) Nasopharyngeal Nasopharyngeal Swab     Status: Abnormal   Collection Time: 08/18/20 11:55 AM   Specimen: Nasopharyngeal Swab  Result Value Ref Range Status   SARS Coronavirus 2 POSITIVE (Fernando Carr) NEGATIVE Final    Comment: (NOTE) SARS-CoV-2 target nucleic acids are DETECTED.  The SARS-CoV-2 RNA is generally detectable in upper and lower respiratory specimens during the acute phase of infection. Positive results are indicative of the presence of SARS-CoV-2 RNA. Clinical correlation with patient history and other diagnostic information is  necessary to determine patient infection status. Positive results do not rule out bacterial infection or co-infection with other viruses.  The expected result is Negative.  Fact Sheet for Patients: SugarRoll.be  Fact Sheet for Healthcare Providers: https://www.woods-mathews.com/  This test is not yet approved or cleared by the [place] FDA and  has been authorized for detection and/or diagnosis of SARS-CoV-2 by FDA under an Emergency Use Authorization (EUA). This EUA will remain  in effect (meaning this test can be used) for the duration of the COVID-19 declaration under Section 564(b)(1) of the Act, 21 U. S.C. section 360bbb-3(b)(1), unless the authorization is terminated or revoked sooner.   Performed at [facility], [place] [address] [place], [place] [address]   Resp panel by  RT-PCR (RSV, Flu Fernando Carr&B, Covid)     Status: Abnormal   Collection Time: 08/18/20 11:30 PM  Result Value Ref Range Status   SARS Coronavirus 2 by RT PCR POSITIVE (Fernando Carr) NEGATIVE Final    Comment: CRITICAL RESULT CALLED TO, READ BACK BY AND VERIFIED WITH: RN K CUMINGS AT 0056 08/19/20 Fernando Carr (NOTE) SARS-CoV-2 target nucleic acids are DETECTED.  The SARS-CoV-2 RNA is generally detectable in upper respiratory specimens during the acute phase of infection. Positive results are indicative of the presence of the identified virus, but do not rule out bacterial infection or co-infection with other pathogens not detected by the test. Clinical correlation with patient history and other diagnostic information is necessary to determine patient infection status. The expected result is Negative.  Fact Sheet for Patients: EntrepreneurPulse.com.au  Fact Sheet for Healthcare Providers: IncredibleEmployment.be  This test is not yet approved or cleared by the [place] FDA and  has been authorized for detection and/or diagnosis of SARS-CoV-2 by FDA under an Emergency Use Authorization (EUA).  This EUA will remain in effect (meanin g this test can be used) for the duration of  the COVID-19 declaration under Section 564(b)(1) of the Act, 21 U.S.C. section 360bbb-3(b)(1), unless the authorization is terminated or revoked sooner.     Influenza Fernando Carr by PCR NEGATIVE NEGATIVE Final   Influenza B by PCR NEGATIVE NEGATIVE Final    Comment: (NOTE) The Xpert Xpress SARS-CoV-2/FLU/RSV plus assay is intended as an aid in the diagnosis of influenza from Nasopharyngeal swab specimens and should not be used as Fernando Carr sole basis for treatment. Nasal washings and aspirates are unacceptable for Xpert Xpress SARS-CoV-2/FLU/RSV testing.  Fact Sheet for Patients: EntrepreneurPulse.com.au  Fact Sheet for Healthcare  Providers: IncredibleEmployment.be  This test is not yet approved or cleared by the [place] FDA and has been authorized for detection and/or diagnosis of SARS-CoV-2 by FDA under an Emergency Use Authorization (EUA). This EUA will remain in effect (meaning this test can be used) for the duration of the COVID-19 declaration under Section 564(b)(1) of the Act, 21 U.S.C. section 360bbb-3(b)(1), unless the authorization is terminated or revoked.     Resp Syncytial Virus by PCR NEGATIVE NEGATIVE  Final    Comment: (NOTE) Fact Sheet for Patients: EntrepreneurPulse.com.au  Fact Sheet for Healthcare Providers: IncredibleEmployment.be  This test is not yet approved or cleared by the [place] FDA and has been authorized for detection and/or diagnosis of SARS-CoV-2 by FDA under an Emergency Use Authorization (EUA). This EUA will remain in effect (meaning this test can be used) for the duration of the COVID-19 declaration under Section 564(b)(1) of the Act, 21 U.S.C. section 360bbb-3(b)(1), unless the authorization is terminated or revoked.  Performed at [facility], [place] [address], [place], [place] [address]     Time coordinating discharge: Approximately 25 minutes  Fayrene Helper, MD  [facility] 08/26/2020, 9:22 AM

## 2020-08-27 DIAGNOSIS — U071 COVID-19: Secondary | ICD-10-CM | POA: Diagnosis not present

## 2020-08-27 DIAGNOSIS — I480 Paroxysmal atrial fibrillation: Secondary | ICD-10-CM | POA: Diagnosis not present

## 2020-08-27 DIAGNOSIS — N179 Acute kidney failure, unspecified: Secondary | ICD-10-CM | POA: Diagnosis not present

## 2020-08-27 DIAGNOSIS — N189 Chronic kidney disease, unspecified: Secondary | ICD-10-CM | POA: Diagnosis not present

## 2020-08-27 DIAGNOSIS — E538 Deficiency of other specified B group vitamins: Secondary | ICD-10-CM | POA: Diagnosis not present

## 2020-08-27 DIAGNOSIS — S72001D Fracture of unspecified part of neck of right femur, subsequent encounter for closed fracture with routine healing: Secondary | ICD-10-CM | POA: Diagnosis not present

## 2020-08-27 DIAGNOSIS — D62 Acute posthemorrhagic anemia: Secondary | ICD-10-CM | POA: Diagnosis not present

## 2020-08-27 DIAGNOSIS — R69 Illness, unspecified: Secondary | ICD-10-CM | POA: Diagnosis not present

## 2020-09-03 DIAGNOSIS — R69 Illness, unspecified: Secondary | ICD-10-CM | POA: Diagnosis not present

## 2020-09-07 DIAGNOSIS — S72041D Displaced fracture of base of neck of right femur, subsequent encounter for closed fracture with routine healing: Secondary | ICD-10-CM | POA: Diagnosis not present

## 2020-09-07 DIAGNOSIS — M6281 Muscle weakness (generalized): Secondary | ICD-10-CM | POA: Diagnosis not present

## 2020-09-07 DIAGNOSIS — R69 Illness, unspecified: Secondary | ICD-10-CM | POA: Diagnosis not present

## 2020-09-07 DIAGNOSIS — S72001D Fracture of unspecified part of neck of right femur, subsequent encounter for closed fracture with routine healing: Secondary | ICD-10-CM | POA: Diagnosis not present

## 2020-09-09 ENCOUNTER — Other Ambulatory Visit: Payer: Self-pay

## 2020-09-09 ENCOUNTER — Telehealth (INDEPENDENT_AMBULATORY_CARE_PROVIDER_SITE_OTHER): Payer: Medicare HMO | Admitting: Psychiatry

## 2020-09-09 ENCOUNTER — Encounter (HOSPITAL_COMMUNITY): Payer: Self-pay | Admitting: Psychiatry

## 2020-09-09 DIAGNOSIS — N183 Chronic kidney disease, stage 3 unspecified: Secondary | ICD-10-CM | POA: Diagnosis not present

## 2020-09-09 DIAGNOSIS — S72001D Fracture of unspecified part of neck of right femur, subsequent encounter for closed fracture with routine healing: Secondary | ICD-10-CM | POA: Diagnosis not present

## 2020-09-09 DIAGNOSIS — S72041D Displaced fracture of base of neck of right femur, subsequent encounter for closed fracture with routine healing: Secondary | ICD-10-CM | POA: Diagnosis not present

## 2020-09-09 DIAGNOSIS — G20 Parkinson's disease: Secondary | ICD-10-CM | POA: Diagnosis not present

## 2020-09-09 DIAGNOSIS — N1831 Chronic kidney disease, stage 3a: Secondary | ICD-10-CM | POA: Diagnosis not present

## 2020-09-09 DIAGNOSIS — R69 Illness, unspecified: Secondary | ICD-10-CM | POA: Diagnosis not present

## 2020-09-09 DIAGNOSIS — E782 Mixed hyperlipidemia: Secondary | ICD-10-CM | POA: Diagnosis not present

## 2020-09-09 DIAGNOSIS — U071 COVID-19: Secondary | ICD-10-CM | POA: Diagnosis not present

## 2020-09-09 DIAGNOSIS — I480 Paroxysmal atrial fibrillation: Secondary | ICD-10-CM | POA: Diagnosis not present

## 2020-09-09 DIAGNOSIS — F319 Bipolar disorder, unspecified: Secondary | ICD-10-CM

## 2020-09-09 DIAGNOSIS — F419 Anxiety disorder, unspecified: Secondary | ICD-10-CM | POA: Diagnosis not present

## 2020-09-09 DIAGNOSIS — E538 Deficiency of other specified B group vitamins: Secondary | ICD-10-CM | POA: Diagnosis not present

## 2020-09-09 DIAGNOSIS — E7800 Pure hypercholesterolemia, unspecified: Secondary | ICD-10-CM | POA: Diagnosis not present

## 2020-09-09 MED ORDER — RISPERIDONE 0.5 MG PO TABS: 0.5000 mg | ORAL_TABLET | Freq: Every day | ORAL | 0 refills | Status: DC

## 2020-09-09 MED ORDER — SERTRALINE HCL 100 MG PO TABS: 150.0000 mg | ORAL_TABLET | Freq: Every day | ORAL | 0 refills | Status: DC

## 2020-09-09 NOTE — Progress Notes (Signed)
Virtual Visit via Telephone Note  I connected with Fernando Carr on 09/09/20 at  9:20 AM EST by telephone and verified that I am speaking with the correct person using two identifiers.  Location: Patient: Fernando Bach Independent Living Provider: Home   I discussed the limitations, risks, security and privacy concerns of performing an evaluation and management service by telephone and the availability of in person appointments. I also discussed with the patient that there may be a patient responsible charge related to this service. The patient expressed understanding and agreed to proceed.   History of Present Illness: Patient is evaluated by phone session.  He had right hip surgery last month after he fractured his right hip.  He is back to his assisted living facility and going to start rehab.  He is using wheelchair.  He is doing better on his medication and denies any mania, psychosis, hallucination.  His daughter usually takes him to the doctor's appointment.  His appetite is okay and his weight is unchanged from the past.  He is using wheelchair and hoping 1 day he can start walking without any help.  We have increased Zoloft dose 150 mg 2 months ago and he has been doing well with the higher dose.  He denies any crying spells, hopelessness.  Occasionally he complains of pain on the right hip and rarely takes pain medication.  He is in good spirits.  He is adjusting well in the facility.  He started making friends.  He denies any anger or any impulsive behavior.  Past Psychiatric History: H/Osuicidal attemptbyinjectingEthyline Glycol. Hadacute renal failure and required dialysis at that time. H/Oparanoia andmania withimpulsive buyingof expensive carsand selling propertywhendepressed.Tried Abilify and Lexapro. However he had a good response with Risperdal.    Psychiatric Specialty Exam: Physical Exam  Review of Systems  Weight 229 lb (103.9 kg).There is no height or weight  on file to calculate BMI.  General Appearance: NA  Eye Contact:  NA  Speech:  Slow  Volume:  Normal  Mood:  Euthymic  Affect:  NA  Thought Process:  Goal Directed  Orientation:  Full (Time, Place, and Person)  Thought Content:  WDL  Suicidal Thoughts:  No  Homicidal Thoughts:  No  Memory:  Immediate;   Good Recent;   Fair Remote;   Fair  Judgement:  Intact  Insight:  Present  Psychomotor Activity:  NA  Concentration:  Concentration: Fair and Attention Span: Fair  Recall:  Good  Fund of Knowledge:  Good  Language:  Good  Akathisia:  No  Handed:  Right  AIMS (if indicated):     Assets:  Communication Skills Desire for Improvement Housing Resilience  ADL's:  Intact  Cognition:  WNL  Sleep:   ok      Assessment and Plan: Bipolar disorder type I.  Anxiety.  I reviewed blood work results from recent hospitalization.  His BUN/creatinine is improved from the time of admission and his hemoglobin also improved from the time of admission.  He is recovering from hip surgery.  He is in good spirits.  He does not want to change the medication since he feel it is helping his anxiety and mood.  We will continue Risperdal 0.5 mg at bedtime and Zoloft 150 mg daily.  He will start rehab very soon.  I recommended to call us back if is any question or any concern.  Follow-up in 3 months.  Follow Up Instructions:    I discussed the assessment and treatment  plan with the patient. The patient was provided an opportunity to ask questions and all were answered. The patient agreed with the plan and demonstrated an understanding of the instructions.   The patient was advised to call back or seek an in-person evaluation if the symptoms worsen or if the condition fails to improve as anticipated.  I provided 17 minutes of non-face-to-face time during this encounter.   Kathlee Nations, MD

## 2020-09-11 DIAGNOSIS — S72031D Displaced midcervical fracture of right femur, subsequent encounter for closed fracture with routine healing: Secondary | ICD-10-CM | POA: Diagnosis not present

## 2020-09-22 DIAGNOSIS — G20 Parkinson's disease: Secondary | ICD-10-CM | POA: Diagnosis not present

## 2020-09-22 DIAGNOSIS — N1831 Chronic kidney disease, stage 3a: Secondary | ICD-10-CM | POA: Diagnosis not present

## 2020-09-22 DIAGNOSIS — I480 Paroxysmal atrial fibrillation: Secondary | ICD-10-CM | POA: Diagnosis not present

## 2020-09-22 DIAGNOSIS — Z8781 Personal history of (healed) traumatic fracture: Secondary | ICD-10-CM | POA: Diagnosis not present

## 2020-09-22 DIAGNOSIS — R69 Illness, unspecified: Secondary | ICD-10-CM | POA: Diagnosis not present

## 2020-09-22 DIAGNOSIS — N179 Acute kidney failure, unspecified: Secondary | ICD-10-CM | POA: Diagnosis not present

## 2020-09-22 DIAGNOSIS — I739 Peripheral vascular disease, unspecified: Secondary | ICD-10-CM | POA: Diagnosis not present

## 2020-09-22 DIAGNOSIS — R269 Unspecified abnormalities of gait and mobility: Secondary | ICD-10-CM | POA: Diagnosis not present

## 2020-09-22 DIAGNOSIS — U071 COVID-19: Secondary | ICD-10-CM | POA: Diagnosis not present

## 2020-09-22 DIAGNOSIS — D649 Anemia, unspecified: Secondary | ICD-10-CM | POA: Diagnosis not present

## 2020-10-05 DIAGNOSIS — Z20828 Contact with and (suspected) exposure to other viral communicable diseases: Secondary | ICD-10-CM | POA: Diagnosis not present

## 2020-10-12 DIAGNOSIS — Z20828 Contact with and (suspected) exposure to other viral communicable diseases: Secondary | ICD-10-CM | POA: Diagnosis not present

## 2020-10-19 ENCOUNTER — Other Ambulatory Visit (HOSPITAL_COMMUNITY): Payer: Self-pay | Admitting: Psychiatry

## 2020-10-19 DIAGNOSIS — Z20828 Contact with and (suspected) exposure to other viral communicable diseases: Secondary | ICD-10-CM | POA: Diagnosis not present

## 2020-10-19 DIAGNOSIS — F319 Bipolar disorder, unspecified: Secondary | ICD-10-CM

## 2020-10-23 DIAGNOSIS — E782 Mixed hyperlipidemia: Secondary | ICD-10-CM | POA: Diagnosis not present

## 2020-10-23 DIAGNOSIS — I480 Paroxysmal atrial fibrillation: Secondary | ICD-10-CM | POA: Diagnosis not present

## 2020-10-23 DIAGNOSIS — N183 Chronic kidney disease, stage 3 unspecified: Secondary | ICD-10-CM | POA: Diagnosis not present

## 2020-10-23 DIAGNOSIS — F331 Major depressive disorder, recurrent, moderate: Secondary | ICD-10-CM | POA: Diagnosis not present

## 2020-10-23 DIAGNOSIS — F3170 Bipolar disorder, currently in remission, most recent episode unspecified: Secondary | ICD-10-CM | POA: Diagnosis not present

## 2020-10-23 DIAGNOSIS — N1831 Chronic kidney disease, stage 3a: Secondary | ICD-10-CM | POA: Diagnosis not present

## 2020-10-23 DIAGNOSIS — G20 Parkinson's disease: Secondary | ICD-10-CM | POA: Diagnosis not present

## 2020-11-02 DIAGNOSIS — Z20828 Contact with and (suspected) exposure to other viral communicable diseases: Secondary | ICD-10-CM | POA: Diagnosis not present

## 2020-11-09 DIAGNOSIS — Z9889 Other specified postprocedural states: Secondary | ICD-10-CM | POA: Diagnosis not present

## 2020-11-09 DIAGNOSIS — N1831 Chronic kidney disease, stage 3a: Secondary | ICD-10-CM | POA: Diagnosis not present

## 2020-11-09 DIAGNOSIS — R269 Unspecified abnormalities of gait and mobility: Secondary | ICD-10-CM | POA: Diagnosis not present

## 2020-11-09 DIAGNOSIS — G20 Parkinson's disease: Secondary | ICD-10-CM | POA: Diagnosis not present

## 2020-11-09 DIAGNOSIS — F3170 Bipolar disorder, currently in remission, most recent episode unspecified: Secondary | ICD-10-CM | POA: Diagnosis not present

## 2020-11-09 DIAGNOSIS — F331 Major depressive disorder, recurrent, moderate: Secondary | ICD-10-CM | POA: Diagnosis not present

## 2020-11-09 DIAGNOSIS — I739 Peripheral vascular disease, unspecified: Secondary | ICD-10-CM | POA: Diagnosis not present

## 2020-11-09 DIAGNOSIS — I480 Paroxysmal atrial fibrillation: Secondary | ICD-10-CM | POA: Diagnosis not present

## 2020-11-18 DIAGNOSIS — M6281 Muscle weakness (generalized): Secondary | ICD-10-CM | POA: Diagnosis not present

## 2020-11-19 DIAGNOSIS — R2681 Unsteadiness on feet: Secondary | ICD-10-CM | POA: Diagnosis not present

## 2020-11-19 DIAGNOSIS — M6281 Muscle weakness (generalized): Secondary | ICD-10-CM | POA: Diagnosis not present

## 2020-11-19 DIAGNOSIS — R296 Repeated falls: Secondary | ICD-10-CM | POA: Diagnosis not present

## 2020-11-19 DIAGNOSIS — R2689 Other abnormalities of gait and mobility: Secondary | ICD-10-CM | POA: Diagnosis not present

## 2020-11-23 DIAGNOSIS — R2689 Other abnormalities of gait and mobility: Secondary | ICD-10-CM | POA: Diagnosis not present

## 2020-11-23 DIAGNOSIS — Z20828 Contact with and (suspected) exposure to other viral communicable diseases: Secondary | ICD-10-CM | POA: Diagnosis not present

## 2020-11-23 DIAGNOSIS — R296 Repeated falls: Secondary | ICD-10-CM | POA: Diagnosis not present

## 2020-11-23 DIAGNOSIS — M6281 Muscle weakness (generalized): Secondary | ICD-10-CM | POA: Diagnosis not present

## 2020-11-23 DIAGNOSIS — R2681 Unsteadiness on feet: Secondary | ICD-10-CM | POA: Diagnosis not present

## 2020-11-24 DIAGNOSIS — M6281 Muscle weakness (generalized): Secondary | ICD-10-CM | POA: Diagnosis not present

## 2020-11-26 DIAGNOSIS — R296 Repeated falls: Secondary | ICD-10-CM | POA: Diagnosis not present

## 2020-11-26 DIAGNOSIS — R2681 Unsteadiness on feet: Secondary | ICD-10-CM | POA: Diagnosis not present

## 2020-11-26 DIAGNOSIS — R2689 Other abnormalities of gait and mobility: Secondary | ICD-10-CM | POA: Diagnosis not present

## 2020-11-26 DIAGNOSIS — M6281 Muscle weakness (generalized): Secondary | ICD-10-CM | POA: Diagnosis not present

## 2020-11-27 DIAGNOSIS — R296 Repeated falls: Secondary | ICD-10-CM | POA: Diagnosis not present

## 2020-11-27 DIAGNOSIS — R2689 Other abnormalities of gait and mobility: Secondary | ICD-10-CM | POA: Diagnosis not present

## 2020-11-27 DIAGNOSIS — R2681 Unsteadiness on feet: Secondary | ICD-10-CM | POA: Diagnosis not present

## 2020-11-27 DIAGNOSIS — M6281 Muscle weakness (generalized): Secondary | ICD-10-CM | POA: Diagnosis not present

## 2020-11-30 DIAGNOSIS — R2681 Unsteadiness on feet: Secondary | ICD-10-CM | POA: Diagnosis not present

## 2020-11-30 DIAGNOSIS — R296 Repeated falls: Secondary | ICD-10-CM | POA: Diagnosis not present

## 2020-11-30 DIAGNOSIS — M6281 Muscle weakness (generalized): Secondary | ICD-10-CM | POA: Diagnosis not present

## 2020-11-30 DIAGNOSIS — R2689 Other abnormalities of gait and mobility: Secondary | ICD-10-CM | POA: Diagnosis not present

## 2020-12-01 DIAGNOSIS — R296 Repeated falls: Secondary | ICD-10-CM | POA: Diagnosis not present

## 2020-12-01 DIAGNOSIS — R2681 Unsteadiness on feet: Secondary | ICD-10-CM | POA: Diagnosis not present

## 2020-12-01 DIAGNOSIS — M6281 Muscle weakness (generalized): Secondary | ICD-10-CM | POA: Diagnosis not present

## 2020-12-01 DIAGNOSIS — S72031D Displaced midcervical fracture of right femur, subsequent encounter for closed fracture with routine healing: Secondary | ICD-10-CM | POA: Diagnosis not present

## 2020-12-01 DIAGNOSIS — R2689 Other abnormalities of gait and mobility: Secondary | ICD-10-CM | POA: Diagnosis not present

## 2020-12-02 DIAGNOSIS — R2681 Unsteadiness on feet: Secondary | ICD-10-CM | POA: Diagnosis not present

## 2020-12-02 DIAGNOSIS — R2689 Other abnormalities of gait and mobility: Secondary | ICD-10-CM | POA: Diagnosis not present

## 2020-12-02 DIAGNOSIS — R296 Repeated falls: Secondary | ICD-10-CM | POA: Diagnosis not present

## 2020-12-02 DIAGNOSIS — M6281 Muscle weakness (generalized): Secondary | ICD-10-CM | POA: Diagnosis not present

## 2020-12-03 DIAGNOSIS — M6281 Muscle weakness (generalized): Secondary | ICD-10-CM | POA: Diagnosis not present

## 2020-12-04 ENCOUNTER — Other Ambulatory Visit: Payer: Self-pay

## 2020-12-04 ENCOUNTER — Telehealth (INDEPENDENT_AMBULATORY_CARE_PROVIDER_SITE_OTHER): Payer: Medicare HMO | Admitting: Psychiatry

## 2020-12-04 ENCOUNTER — Encounter (HOSPITAL_COMMUNITY): Payer: Self-pay | Admitting: Psychiatry

## 2020-12-04 DIAGNOSIS — F419 Anxiety disorder, unspecified: Secondary | ICD-10-CM | POA: Diagnosis not present

## 2020-12-04 DIAGNOSIS — F319 Bipolar disorder, unspecified: Secondary | ICD-10-CM | POA: Diagnosis not present

## 2020-12-04 DIAGNOSIS — R2689 Other abnormalities of gait and mobility: Secondary | ICD-10-CM | POA: Diagnosis not present

## 2020-12-04 DIAGNOSIS — R2681 Unsteadiness on feet: Secondary | ICD-10-CM | POA: Diagnosis not present

## 2020-12-04 DIAGNOSIS — R296 Repeated falls: Secondary | ICD-10-CM | POA: Diagnosis not present

## 2020-12-04 DIAGNOSIS — M6281 Muscle weakness (generalized): Secondary | ICD-10-CM | POA: Diagnosis not present

## 2020-12-04 MED ORDER — SERTRALINE HCL 100 MG PO TABS: 150.0000 mg | ORAL_TABLET | Freq: Every day | ORAL | 0 refills | Status: DC

## 2020-12-04 MED ORDER — RISPERIDONE 0.5 MG PO TABS: 0.5000 mg | ORAL_TABLET | Freq: Every day | ORAL | 0 refills | Status: DC

## 2020-12-04 NOTE — Progress Notes (Signed)
Virtual Visit via Telephone Note  I connected with Fernando Carr on 12/04/20 at  9:00 AM EDT by telephone and verified that I am speaking with the correct person using two identifiers.  Location: Patient: Fernando Carr ILF Provider: Home Office   I discussed the limitations, risks, security and privacy concerns of performing an evaluation and management service by telephone and the availability of in person appointments. I also discussed with the patient that there may be a patient responsible charge related to this service. The patient expressed understanding and agreed to proceed.   History of Present Illness: Patient is evaluated by phone session.  He is doing better and he finished a rehab but is still have a few sessions of physical therapy.  He reported his right hip is almost recovered after the surgery and he is using Rolaids to help his walking.  He does go outside occasionally from [facility] screen independent living facility when he needed.  He talks to his daughter regularly.  He feels his mood is stable and denies any highs and lows, mania, agitation or any suicidal thoughts.  He denies any crying spells or any feeling of hopelessness.  He is trying to lose weight and he had lost 10 pounds in recent months.  He recently had a visit with his PCP Dr. Milinda Hirschfeld and had a blood work.  He had a blood work family and his BUN is 170 and creatinine 42.  His blood sugar was normal.  He is not seeing a nephrologist because his PCP felt his creatinine is stable.  Patient denies any hallucination or paranoia.  He like to keep his current medication.  Past Psychiatric History: H/Osuicidal attemptbyinjectingEthyline Glycol. Hadacute renal failure and required dialysis at that time. H/Oparanoia andmania withimpulsive buyingof expensive carsand selling propertywhendepressed.Tried Abilify and Lexapro. However he had a good response with Risperdal.    Psychiatric Specialty Exam: Physical  Exam  Review of Systems  Weight 220 lb (99.8 kg).There is no height or weight on file to calculate BMI.  General Appearance: NA  Eye Contact:  NA  Speech:  Slow  Volume:  Decreased  Mood:  Euthymic  Affect:  NA  Thought Process:  Descriptions of Associations: Intact  Orientation:  Full (Time, Place, and Person)  Thought Content:  WDL  Suicidal Thoughts:  No  Homicidal Thoughts:  No  Memory:  Immediate;   Good Recent;   Fair Remote;   Fair  Judgement:  Intact  Insight:  Present  Psychomotor Activity:  NA  Concentration:  Concentration: Fair and Attention Span: Fair  Recall:  [employer] of Knowledge:  Good  Language:  Good  Akathisia:  No  Handed:  Right  AIMS (if indicated):     Assets:  Communication Skills Desire for Improvement Housing  ADL's:  Intact  Cognition:  WNL  Sleep:   Sleep is fine      Assessment and Plan: Bipolar disorder type I.  Anxiety.  I reviewed blood work from his PCP office.  He is on Parkinson medication and he feels they are working.  His BUN is 42 and creatinine 1.70 which was done in February.  His anxiety is under control.  We like to keep the current medication.  He does not offer any side effects.  Continue Risperdal 0.5 mg at bedtime and Zoloft 150 mg daily.  Discussed medication side effects and benefits.  He like to have his prescription sent to [facility].  I recommended to call us back  if is any question of any concern.  Follow-up in 3 months.  Follow Up Instructions:    I discussed the assessment and treatment plan with the patient. The patient was provided an opportunity to ask questions and all were answered. The patient agreed with the plan and demonstrated an understanding of the instructions.   The patient was advised to call back or seek an in-person evaluation if the symptoms worsen or if the condition fails to improve as anticipated.  I provided 15 minutes of non-face-to-face time during this encounter.   Kathlee Nations, MD

## 2020-12-07 DIAGNOSIS — R296 Repeated falls: Secondary | ICD-10-CM | POA: Diagnosis not present

## 2020-12-07 DIAGNOSIS — Z20828 Contact with and (suspected) exposure to other viral communicable diseases: Secondary | ICD-10-CM | POA: Diagnosis not present

## 2020-12-07 DIAGNOSIS — M6281 Muscle weakness (generalized): Secondary | ICD-10-CM | POA: Diagnosis not present

## 2020-12-07 DIAGNOSIS — R2689 Other abnormalities of gait and mobility: Secondary | ICD-10-CM | POA: Diagnosis not present

## 2020-12-07 DIAGNOSIS — R2681 Unsteadiness on feet: Secondary | ICD-10-CM | POA: Diagnosis not present

## 2020-12-08 ENCOUNTER — Ambulatory Visit: Payer: Medicare HMO | Admitting: Diagnostic Neuroimaging

## 2020-12-08 ENCOUNTER — Encounter: Payer: Self-pay | Admitting: Diagnostic Neuroimaging

## 2020-12-08 ENCOUNTER — Telehealth: Payer: Self-pay | Admitting: *Deleted

## 2020-12-08 DIAGNOSIS — R2681 Unsteadiness on feet: Secondary | ICD-10-CM | POA: Diagnosis not present

## 2020-12-08 DIAGNOSIS — M6281 Muscle weakness (generalized): Secondary | ICD-10-CM | POA: Diagnosis not present

## 2020-12-08 DIAGNOSIS — R2689 Other abnormalities of gait and mobility: Secondary | ICD-10-CM | POA: Diagnosis not present

## 2020-12-08 DIAGNOSIS — R296 Repeated falls: Secondary | ICD-10-CM | POA: Diagnosis not present

## 2020-12-08 NOTE — Telephone Encounter (Signed)
Patient was no show for follow up today. 

## 2020-12-09 DIAGNOSIS — R296 Repeated falls: Secondary | ICD-10-CM | POA: Diagnosis not present

## 2020-12-09 DIAGNOSIS — R2689 Other abnormalities of gait and mobility: Secondary | ICD-10-CM | POA: Diagnosis not present

## 2020-12-09 DIAGNOSIS — M6281 Muscle weakness (generalized): Secondary | ICD-10-CM | POA: Diagnosis not present

## 2020-12-09 DIAGNOSIS — R2681 Unsteadiness on feet: Secondary | ICD-10-CM | POA: Diagnosis not present

## 2020-12-10 DIAGNOSIS — M6281 Muscle weakness (generalized): Secondary | ICD-10-CM | POA: Diagnosis not present

## 2020-12-10 DIAGNOSIS — R2681 Unsteadiness on feet: Secondary | ICD-10-CM | POA: Diagnosis not present

## 2020-12-10 DIAGNOSIS — R2689 Other abnormalities of gait and mobility: Secondary | ICD-10-CM | POA: Diagnosis not present

## 2020-12-10 DIAGNOSIS — R296 Repeated falls: Secondary | ICD-10-CM | POA: Diagnosis not present

## 2020-12-14 DIAGNOSIS — R2689 Other abnormalities of gait and mobility: Secondary | ICD-10-CM | POA: Diagnosis not present

## 2020-12-14 DIAGNOSIS — R2681 Unsteadiness on feet: Secondary | ICD-10-CM | POA: Diagnosis not present

## 2020-12-14 DIAGNOSIS — Z20828 Contact with and (suspected) exposure to other viral communicable diseases: Secondary | ICD-10-CM | POA: Diagnosis not present

## 2020-12-14 DIAGNOSIS — R296 Repeated falls: Secondary | ICD-10-CM | POA: Diagnosis not present

## 2020-12-14 DIAGNOSIS — M6281 Muscle weakness (generalized): Secondary | ICD-10-CM | POA: Diagnosis not present

## 2020-12-15 DIAGNOSIS — M6281 Muscle weakness (generalized): Secondary | ICD-10-CM | POA: Diagnosis not present

## 2020-12-16 DIAGNOSIS — M6281 Muscle weakness (generalized): Secondary | ICD-10-CM | POA: Diagnosis not present

## 2020-12-16 DIAGNOSIS — R296 Repeated falls: Secondary | ICD-10-CM | POA: Diagnosis not present

## 2020-12-16 DIAGNOSIS — R2681 Unsteadiness on feet: Secondary | ICD-10-CM | POA: Diagnosis not present

## 2020-12-16 DIAGNOSIS — R2689 Other abnormalities of gait and mobility: Secondary | ICD-10-CM | POA: Diagnosis not present

## 2020-12-17 DIAGNOSIS — R2689 Other abnormalities of gait and mobility: Secondary | ICD-10-CM | POA: Diagnosis not present

## 2020-12-17 DIAGNOSIS — M6281 Muscle weakness (generalized): Secondary | ICD-10-CM | POA: Diagnosis not present

## 2020-12-17 DIAGNOSIS — R296 Repeated falls: Secondary | ICD-10-CM | POA: Diagnosis not present

## 2020-12-17 DIAGNOSIS — R2681 Unsteadiness on feet: Secondary | ICD-10-CM | POA: Diagnosis not present

## 2020-12-18 DIAGNOSIS — M6281 Muscle weakness (generalized): Secondary | ICD-10-CM | POA: Diagnosis not present

## 2020-12-18 DIAGNOSIS — R2681 Unsteadiness on feet: Secondary | ICD-10-CM | POA: Diagnosis not present

## 2020-12-18 DIAGNOSIS — R2689 Other abnormalities of gait and mobility: Secondary | ICD-10-CM | POA: Diagnosis not present

## 2020-12-18 DIAGNOSIS — R296 Repeated falls: Secondary | ICD-10-CM | POA: Diagnosis not present

## 2020-12-21 DIAGNOSIS — R2681 Unsteadiness on feet: Secondary | ICD-10-CM | POA: Diagnosis not present

## 2020-12-21 DIAGNOSIS — R296 Repeated falls: Secondary | ICD-10-CM | POA: Diagnosis not present

## 2020-12-21 DIAGNOSIS — R2689 Other abnormalities of gait and mobility: Secondary | ICD-10-CM | POA: Diagnosis not present

## 2020-12-21 DIAGNOSIS — M6281 Muscle weakness (generalized): Secondary | ICD-10-CM | POA: Diagnosis not present

## 2020-12-22 DIAGNOSIS — R488 Other symbolic dysfunctions: Secondary | ICD-10-CM | POA: Diagnosis not present

## 2020-12-22 DIAGNOSIS — M6281 Muscle weakness (generalized): Secondary | ICD-10-CM | POA: Diagnosis not present

## 2020-12-22 DIAGNOSIS — R296 Repeated falls: Secondary | ICD-10-CM | POA: Diagnosis not present

## 2020-12-22 DIAGNOSIS — R2689 Other abnormalities of gait and mobility: Secondary | ICD-10-CM | POA: Diagnosis not present

## 2020-12-22 DIAGNOSIS — R41841 Cognitive communication deficit: Secondary | ICD-10-CM | POA: Diagnosis not present

## 2020-12-22 DIAGNOSIS — R2681 Unsteadiness on feet: Secondary | ICD-10-CM | POA: Diagnosis not present

## 2020-12-23 ENCOUNTER — Telehealth: Payer: Self-pay | Admitting: Cardiology

## 2020-12-23 DIAGNOSIS — R41841 Cognitive communication deficit: Secondary | ICD-10-CM | POA: Diagnosis not present

## 2020-12-23 DIAGNOSIS — R2689 Other abnormalities of gait and mobility: Secondary | ICD-10-CM | POA: Diagnosis not present

## 2020-12-23 DIAGNOSIS — R296 Repeated falls: Secondary | ICD-10-CM | POA: Diagnosis not present

## 2020-12-23 DIAGNOSIS — M6281 Muscle weakness (generalized): Secondary | ICD-10-CM | POA: Diagnosis not present

## 2020-12-23 DIAGNOSIS — R488 Other symbolic dysfunctions: Secondary | ICD-10-CM | POA: Diagnosis not present

## 2020-12-23 DIAGNOSIS — R2681 Unsteadiness on feet: Secondary | ICD-10-CM | POA: Diagnosis not present

## 2020-12-23 NOTE — Telephone Encounter (Signed)
Called patient to schedule recall. He states he does not want to follow up with the office.

## 2020-12-24 DIAGNOSIS — R41841 Cognitive communication deficit: Secondary | ICD-10-CM | POA: Diagnosis not present

## 2020-12-24 DIAGNOSIS — R488 Other symbolic dysfunctions: Secondary | ICD-10-CM | POA: Diagnosis not present

## 2020-12-25 DIAGNOSIS — M6281 Muscle weakness (generalized): Secondary | ICD-10-CM | POA: Diagnosis not present

## 2020-12-29 DIAGNOSIS — R2681 Unsteadiness on feet: Secondary | ICD-10-CM | POA: Diagnosis not present

## 2020-12-29 DIAGNOSIS — M6281 Muscle weakness (generalized): Secondary | ICD-10-CM | POA: Diagnosis not present

## 2020-12-29 DIAGNOSIS — R296 Repeated falls: Secondary | ICD-10-CM | POA: Diagnosis not present

## 2020-12-29 DIAGNOSIS — R2689 Other abnormalities of gait and mobility: Secondary | ICD-10-CM | POA: Diagnosis not present

## 2020-12-30 DIAGNOSIS — R2689 Other abnormalities of gait and mobility: Secondary | ICD-10-CM | POA: Diagnosis not present

## 2020-12-30 DIAGNOSIS — R296 Repeated falls: Secondary | ICD-10-CM | POA: Diagnosis not present

## 2020-12-30 DIAGNOSIS — M6281 Muscle weakness (generalized): Secondary | ICD-10-CM | POA: Diagnosis not present

## 2020-12-30 DIAGNOSIS — R2681 Unsteadiness on feet: Secondary | ICD-10-CM | POA: Diagnosis not present

## 2020-12-31 DIAGNOSIS — R296 Repeated falls: Secondary | ICD-10-CM | POA: Diagnosis not present

## 2020-12-31 DIAGNOSIS — R2681 Unsteadiness on feet: Secondary | ICD-10-CM | POA: Diagnosis not present

## 2020-12-31 DIAGNOSIS — R2689 Other abnormalities of gait and mobility: Secondary | ICD-10-CM | POA: Diagnosis not present

## 2020-12-31 DIAGNOSIS — M6281 Muscle weakness (generalized): Secondary | ICD-10-CM | POA: Diagnosis not present

## 2021-01-01 DIAGNOSIS — M6281 Muscle weakness (generalized): Secondary | ICD-10-CM | POA: Diagnosis not present

## 2021-01-01 DIAGNOSIS — R2681 Unsteadiness on feet: Secondary | ICD-10-CM | POA: Diagnosis not present

## 2021-01-01 DIAGNOSIS — R296 Repeated falls: Secondary | ICD-10-CM | POA: Diagnosis not present

## 2021-01-01 DIAGNOSIS — R2689 Other abnormalities of gait and mobility: Secondary | ICD-10-CM | POA: Diagnosis not present

## 2021-01-04 ENCOUNTER — Other Ambulatory Visit (HOSPITAL_COMMUNITY): Payer: Self-pay | Admitting: Psychiatry

## 2021-01-04 DIAGNOSIS — Z20828 Contact with and (suspected) exposure to other viral communicable diseases: Secondary | ICD-10-CM | POA: Diagnosis not present

## 2021-01-04 DIAGNOSIS — F319 Bipolar disorder, unspecified: Secondary | ICD-10-CM

## 2021-01-05 DIAGNOSIS — R2689 Other abnormalities of gait and mobility: Secondary | ICD-10-CM | POA: Diagnosis not present

## 2021-01-05 DIAGNOSIS — R296 Repeated falls: Secondary | ICD-10-CM | POA: Diagnosis not present

## 2021-01-05 DIAGNOSIS — M6281 Muscle weakness (generalized): Secondary | ICD-10-CM | POA: Diagnosis not present

## 2021-01-05 DIAGNOSIS — R41841 Cognitive communication deficit: Secondary | ICD-10-CM | POA: Diagnosis not present

## 2021-01-05 DIAGNOSIS — R2681 Unsteadiness on feet: Secondary | ICD-10-CM | POA: Diagnosis not present

## 2021-01-05 DIAGNOSIS — R488 Other symbolic dysfunctions: Secondary | ICD-10-CM | POA: Diagnosis not present

## 2021-01-06 DIAGNOSIS — M6281 Muscle weakness (generalized): Secondary | ICD-10-CM | POA: Diagnosis not present

## 2021-01-06 DIAGNOSIS — R296 Repeated falls: Secondary | ICD-10-CM | POA: Diagnosis not present

## 2021-01-06 DIAGNOSIS — R41841 Cognitive communication deficit: Secondary | ICD-10-CM | POA: Diagnosis not present

## 2021-01-06 DIAGNOSIS — R2689 Other abnormalities of gait and mobility: Secondary | ICD-10-CM | POA: Diagnosis not present

## 2021-01-06 DIAGNOSIS — R488 Other symbolic dysfunctions: Secondary | ICD-10-CM | POA: Diagnosis not present

## 2021-01-06 DIAGNOSIS — R2681 Unsteadiness on feet: Secondary | ICD-10-CM | POA: Diagnosis not present

## 2021-01-07 DIAGNOSIS — R2689 Other abnormalities of gait and mobility: Secondary | ICD-10-CM | POA: Diagnosis not present

## 2021-01-07 DIAGNOSIS — M6281 Muscle weakness (generalized): Secondary | ICD-10-CM | POA: Diagnosis not present

## 2021-01-07 DIAGNOSIS — R296 Repeated falls: Secondary | ICD-10-CM | POA: Diagnosis not present

## 2021-01-07 DIAGNOSIS — R2681 Unsteadiness on feet: Secondary | ICD-10-CM | POA: Diagnosis not present

## 2021-01-07 DIAGNOSIS — Z20828 Contact with and (suspected) exposure to other viral communicable diseases: Secondary | ICD-10-CM | POA: Diagnosis not present

## 2021-01-08 DIAGNOSIS — R41841 Cognitive communication deficit: Secondary | ICD-10-CM | POA: Diagnosis not present

## 2021-01-08 DIAGNOSIS — R2681 Unsteadiness on feet: Secondary | ICD-10-CM | POA: Diagnosis not present

## 2021-01-08 DIAGNOSIS — M6281 Muscle weakness (generalized): Secondary | ICD-10-CM | POA: Diagnosis not present

## 2021-01-08 DIAGNOSIS — R2689 Other abnormalities of gait and mobility: Secondary | ICD-10-CM | POA: Diagnosis not present

## 2021-01-08 DIAGNOSIS — R296 Repeated falls: Secondary | ICD-10-CM | POA: Diagnosis not present

## 2021-01-08 DIAGNOSIS — R488 Other symbolic dysfunctions: Secondary | ICD-10-CM | POA: Diagnosis not present

## 2021-01-11 DIAGNOSIS — M6281 Muscle weakness (generalized): Secondary | ICD-10-CM | POA: Diagnosis not present

## 2021-01-11 DIAGNOSIS — R296 Repeated falls: Secondary | ICD-10-CM | POA: Diagnosis not present

## 2021-01-11 DIAGNOSIS — R2681 Unsteadiness on feet: Secondary | ICD-10-CM | POA: Diagnosis not present

## 2021-01-11 DIAGNOSIS — R2689 Other abnormalities of gait and mobility: Secondary | ICD-10-CM | POA: Diagnosis not present

## 2021-01-12 DIAGNOSIS — R2689 Other abnormalities of gait and mobility: Secondary | ICD-10-CM | POA: Diagnosis not present

## 2021-01-12 DIAGNOSIS — R41841 Cognitive communication deficit: Secondary | ICD-10-CM | POA: Diagnosis not present

## 2021-01-12 DIAGNOSIS — M6281 Muscle weakness (generalized): Secondary | ICD-10-CM | POA: Diagnosis not present

## 2021-01-12 DIAGNOSIS — R2681 Unsteadiness on feet: Secondary | ICD-10-CM | POA: Diagnosis not present

## 2021-01-12 DIAGNOSIS — R296 Repeated falls: Secondary | ICD-10-CM | POA: Diagnosis not present

## 2021-01-12 DIAGNOSIS — R488 Other symbolic dysfunctions: Secondary | ICD-10-CM | POA: Diagnosis not present

## 2021-01-13 DIAGNOSIS — R488 Other symbolic dysfunctions: Secondary | ICD-10-CM | POA: Diagnosis not present

## 2021-01-13 DIAGNOSIS — R296 Repeated falls: Secondary | ICD-10-CM | POA: Diagnosis not present

## 2021-01-13 DIAGNOSIS — R41841 Cognitive communication deficit: Secondary | ICD-10-CM | POA: Diagnosis not present

## 2021-01-13 DIAGNOSIS — R2689 Other abnormalities of gait and mobility: Secondary | ICD-10-CM | POA: Diagnosis not present

## 2021-01-13 DIAGNOSIS — R2681 Unsteadiness on feet: Secondary | ICD-10-CM | POA: Diagnosis not present

## 2021-01-13 DIAGNOSIS — M6281 Muscle weakness (generalized): Secondary | ICD-10-CM | POA: Diagnosis not present

## 2021-01-14 DIAGNOSIS — R2681 Unsteadiness on feet: Secondary | ICD-10-CM | POA: Diagnosis not present

## 2021-01-14 DIAGNOSIS — R296 Repeated falls: Secondary | ICD-10-CM | POA: Diagnosis not present

## 2021-01-14 DIAGNOSIS — Z20828 Contact with and (suspected) exposure to other viral communicable diseases: Secondary | ICD-10-CM | POA: Diagnosis not present

## 2021-01-14 DIAGNOSIS — M6281 Muscle weakness (generalized): Secondary | ICD-10-CM | POA: Diagnosis not present

## 2021-01-14 DIAGNOSIS — R2689 Other abnormalities of gait and mobility: Secondary | ICD-10-CM | POA: Diagnosis not present

## 2021-01-15 DIAGNOSIS — M6281 Muscle weakness (generalized): Secondary | ICD-10-CM | POA: Diagnosis not present

## 2021-01-15 DIAGNOSIS — R41841 Cognitive communication deficit: Secondary | ICD-10-CM | POA: Diagnosis not present

## 2021-01-15 DIAGNOSIS — R488 Other symbolic dysfunctions: Secondary | ICD-10-CM | POA: Diagnosis not present

## 2021-01-18 ENCOUNTER — Ambulatory Visit: Payer: Medicare HMO | Admitting: Diagnostic Neuroimaging

## 2021-01-18 ENCOUNTER — Encounter: Payer: Self-pay | Admitting: Diagnostic Neuroimaging

## 2021-01-18 ENCOUNTER — Other Ambulatory Visit: Payer: Self-pay

## 2021-01-18 VITALS — BP 105/64 | HR 72 | Ht 72.0 in | Wt 216.2 lb

## 2021-01-18 DIAGNOSIS — M6281 Muscle weakness (generalized): Secondary | ICD-10-CM | POA: Diagnosis not present

## 2021-01-18 DIAGNOSIS — R2681 Unsteadiness on feet: Secondary | ICD-10-CM | POA: Diagnosis not present

## 2021-01-18 DIAGNOSIS — R55 Syncope and collapse: Secondary | ICD-10-CM

## 2021-01-18 DIAGNOSIS — G20 Parkinson's disease: Secondary | ICD-10-CM

## 2021-01-18 DIAGNOSIS — R2689 Other abnormalities of gait and mobility: Secondary | ICD-10-CM | POA: Diagnosis not present

## 2021-01-18 DIAGNOSIS — R41841 Cognitive communication deficit: Secondary | ICD-10-CM | POA: Diagnosis not present

## 2021-01-18 DIAGNOSIS — R488 Other symbolic dysfunctions: Secondary | ICD-10-CM | POA: Diagnosis not present

## 2021-01-18 DIAGNOSIS — R296 Repeated falls: Secondary | ICD-10-CM | POA: Diagnosis not present

## 2021-01-18 MED ORDER — CARBIDOPA-LEVODOPA 25-100 MG PO TABS
1.0000 | ORAL_TABLET | Freq: Four times a day (QID) | ORAL | 4 refills | Status: DC
Start: 2021-01-18 — End: 2021-01-31
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
## 2021-01-18 NOTE — Patient Instructions (Signed)
  PARKINSON'S DISEASE  - continue carb/levo 25/100 1 tab 4 times a day; MAY INCREASE TO 2 TABS three times a day IN FUTURE  - continue balance and strength exercises

## 2021-01-20 DIAGNOSIS — R296 Repeated falls: Secondary | ICD-10-CM | POA: Diagnosis not present

## 2021-01-20 DIAGNOSIS — R2681 Unsteadiness on feet: Secondary | ICD-10-CM | POA: Diagnosis not present

## 2021-01-20 DIAGNOSIS — R2689 Other abnormalities of gait and mobility: Secondary | ICD-10-CM | POA: Diagnosis not present

## 2021-01-20 DIAGNOSIS — R488 Other symbolic dysfunctions: Secondary | ICD-10-CM | POA: Diagnosis not present

## 2021-01-20 DIAGNOSIS — M6281 Muscle weakness (generalized): Secondary | ICD-10-CM | POA: Diagnosis not present

## 2021-01-20 DIAGNOSIS — R41841 Cognitive communication deficit: Secondary | ICD-10-CM | POA: Diagnosis not present

## 2021-01-21 DIAGNOSIS — R488 Other symbolic dysfunctions: Secondary | ICD-10-CM | POA: Diagnosis not present

## 2021-01-21 DIAGNOSIS — R41841 Cognitive communication deficit: Secondary | ICD-10-CM | POA: Diagnosis not present

## 2021-01-21 DIAGNOSIS — Z20828 Contact with and (suspected) exposure to other viral communicable diseases: Secondary | ICD-10-CM | POA: Diagnosis not present

## 2021-01-22 DIAGNOSIS — R296 Repeated falls: Secondary | ICD-10-CM | POA: Diagnosis not present

## 2021-01-22 DIAGNOSIS — M6281 Muscle weakness (generalized): Secondary | ICD-10-CM | POA: Diagnosis not present

## 2021-01-22 DIAGNOSIS — R2689 Other abnormalities of gait and mobility: Secondary | ICD-10-CM | POA: Diagnosis not present

## 2021-01-22 DIAGNOSIS — R2681 Unsteadiness on feet: Secondary | ICD-10-CM | POA: Diagnosis not present

## 2021-01-25 DIAGNOSIS — M6281 Muscle weakness (generalized): Secondary | ICD-10-CM | POA: Diagnosis not present

## 2021-01-25 DIAGNOSIS — R296 Repeated falls: Secondary | ICD-10-CM | POA: Diagnosis not present

## 2021-01-25 DIAGNOSIS — R2681 Unsteadiness on feet: Secondary | ICD-10-CM | POA: Diagnosis not present

## 2021-01-25 DIAGNOSIS — R2689 Other abnormalities of gait and mobility: Secondary | ICD-10-CM | POA: Diagnosis not present

## 2021-01-26 DIAGNOSIS — R2689 Other abnormalities of gait and mobility: Secondary | ICD-10-CM | POA: Diagnosis not present

## 2021-01-26 DIAGNOSIS — R296 Repeated falls: Secondary | ICD-10-CM | POA: Diagnosis not present

## 2021-01-26 DIAGNOSIS — R2681 Unsteadiness on feet: Secondary | ICD-10-CM | POA: Diagnosis not present

## 2021-01-26 DIAGNOSIS — M6281 Muscle weakness (generalized): Secondary | ICD-10-CM | POA: Diagnosis not present

## 2021-01-27 DIAGNOSIS — R41841 Cognitive communication deficit: Secondary | ICD-10-CM | POA: Diagnosis not present

## 2021-01-27 DIAGNOSIS — R2689 Other abnormalities of gait and mobility: Secondary | ICD-10-CM | POA: Diagnosis not present

## 2021-01-27 DIAGNOSIS — R488 Other symbolic dysfunctions: Secondary | ICD-10-CM | POA: Diagnosis not present

## 2021-01-27 DIAGNOSIS — R2681 Unsteadiness on feet: Secondary | ICD-10-CM | POA: Diagnosis not present

## 2021-01-27 DIAGNOSIS — M6281 Muscle weakness (generalized): Secondary | ICD-10-CM | POA: Diagnosis not present

## 2021-01-27 DIAGNOSIS — R296 Repeated falls: Secondary | ICD-10-CM | POA: Diagnosis not present

## 2021-01-28 ENCOUNTER — Inpatient Hospital Stay (HOSPITAL_COMMUNITY)
Admission: EM | Admit: 2021-01-28 | Discharge: 2021-01-31 | DRG: 312 | Disposition: A | Discharge status: Home Health | Payer: Medicare HMO | Attending: Internal Medicine | Admitting: Internal Medicine

## 2021-01-28 ENCOUNTER — Other Ambulatory Visit: Payer: Self-pay

## 2021-01-28 DIAGNOSIS — Z6836 Body mass index (BMI) 36.0-36.9, adult: Secondary | ICD-10-CM

## 2021-01-28 DIAGNOSIS — I129 Hypertensive chronic kidney disease with stage 1 through stage 4 chronic kidney disease, or unspecified chronic kidney disease: Secondary | ICD-10-CM | POA: Diagnosis present

## 2021-01-28 DIAGNOSIS — D631 Anemia in chronic kidney disease: Secondary | ICD-10-CM | POA: Diagnosis present

## 2021-01-28 DIAGNOSIS — N179 Acute kidney failure, unspecified: Secondary | ICD-10-CM | POA: Diagnosis not present

## 2021-01-28 DIAGNOSIS — R7989 Other specified abnormal findings of blood chemistry: Secondary | ICD-10-CM | POA: Diagnosis not present

## 2021-01-28 DIAGNOSIS — G903 Multi-system degeneration of the autonomic nervous system: Secondary | ICD-10-CM | POA: Diagnosis present

## 2021-01-28 DIAGNOSIS — F319 Bipolar disorder, unspecified: Secondary | ICD-10-CM | POA: Diagnosis present

## 2021-01-28 DIAGNOSIS — R0902 Hypoxemia: Secondary | ICD-10-CM | POA: Diagnosis not present

## 2021-01-28 DIAGNOSIS — N189 Chronic kidney disease, unspecified: Secondary | ICD-10-CM | POA: Diagnosis not present

## 2021-01-28 DIAGNOSIS — Z811 Family history of alcohol abuse and dependence: Secondary | ICD-10-CM

## 2021-01-28 DIAGNOSIS — Z743 Need for continuous supervision: Secondary | ICD-10-CM | POA: Diagnosis not present

## 2021-01-28 DIAGNOSIS — E669 Obesity, unspecified: Secondary | ICD-10-CM | POA: Diagnosis present

## 2021-01-28 DIAGNOSIS — F17210 Nicotine dependence, cigarettes, uncomplicated: Secondary | ICD-10-CM | POA: Diagnosis present

## 2021-01-28 DIAGNOSIS — Z79899 Other long term (current) drug therapy: Secondary | ICD-10-CM

## 2021-01-28 DIAGNOSIS — M6281 Muscle weakness (generalized): Secondary | ICD-10-CM | POA: Diagnosis not present

## 2021-01-28 DIAGNOSIS — N1831 Chronic kidney disease, stage 3a: Secondary | ICD-10-CM | POA: Diagnosis not present

## 2021-01-28 DIAGNOSIS — R55 Syncope and collapse: Secondary | ICD-10-CM | POA: Diagnosis not present

## 2021-01-28 DIAGNOSIS — Z7901 Long term (current) use of anticoagulants: Secondary | ICD-10-CM | POA: Diagnosis not present

## 2021-01-28 DIAGNOSIS — I480 Paroxysmal atrial fibrillation: Secondary | ICD-10-CM | POA: Diagnosis present

## 2021-01-28 DIAGNOSIS — I951 Orthostatic hypotension: Secondary | ICD-10-CM | POA: Diagnosis not present

## 2021-01-28 DIAGNOSIS — I959 Hypotension, unspecified: Secondary | ICD-10-CM | POA: Diagnosis not present

## 2021-01-28 DIAGNOSIS — Z818 Family history of other mental and behavioral disorders: Secondary | ICD-10-CM

## 2021-01-28 DIAGNOSIS — E860 Dehydration: Secondary | ICD-10-CM | POA: Diagnosis not present

## 2021-01-28 DIAGNOSIS — N183 Chronic kidney disease, stage 3 unspecified: Secondary | ICD-10-CM | POA: Diagnosis present

## 2021-01-28 DIAGNOSIS — I714 Abdominal aortic aneurysm, without rupture: Secondary | ICD-10-CM | POA: Diagnosis present

## 2021-01-28 DIAGNOSIS — Z20822 Contact with and (suspected) exposure to covid-19: Secondary | ICD-10-CM | POA: Diagnosis present

## 2021-01-28 DIAGNOSIS — E7800 Pure hypercholesterolemia, unspecified: Secondary | ICD-10-CM | POA: Diagnosis present

## 2021-01-28 DIAGNOSIS — Z96641 Presence of right artificial hip joint: Secondary | ICD-10-CM | POA: Diagnosis present

## 2021-01-28 DIAGNOSIS — G20 Parkinson's disease: Secondary | ICD-10-CM | POA: Diagnosis not present

## 2021-01-28 LAB — URINALYSIS, ROUTINE W REFLEX MICROSCOPIC
Bacteria, UA: NONE SEEN
Bilirubin Urine: NEGATIVE
Glucose, UA: 50 mg/dL — AB
Hgb urine dipstick: NEGATIVE
Ketones, ur: NEGATIVE mg/dL
Leukocytes,Ua: NEGATIVE
Nitrite: NEGATIVE
Protein, ur: 30 mg/dL — AB
Specific Gravity, Urine: 1.011 (ref 1.005–1.030)
pH: 6 (ref 5.0–8.0)

## 2021-01-28 LAB — CBC
HCT: 34.2 % — ABNORMAL LOW (ref 39.0–52.0)
Hemoglobin: 10.5 g/dL — ABNORMAL LOW (ref 13.0–17.0)
MCH: 28.7 pg (ref 26.0–34.0)
MCHC: 30.7 g/dL (ref 30.0–36.0)
MCV: 93.4 fL (ref 80.0–100.0)
Platelets: 158 10*3/uL (ref 150–400)
RBC: 3.66 MIL/uL — ABNORMAL LOW (ref 4.22–5.81)
RDW: 13.3 % (ref 11.5–15.5)
WBC: 4.6 10*3/uL (ref 4.0–10.5)
nRBC: 0 % (ref 0.0–0.2)

## 2021-01-28 LAB — BASIC METABOLIC PANEL
Anion gap: 9 (ref 5–15)
BUN: 37 mg/dL — ABNORMAL HIGH (ref 8–23)
CO2: 21 mmol/L — ABNORMAL LOW (ref 22–32)
Calcium: 9.6 mg/dL (ref 8.9–10.3)
Chloride: 108 mmol/L (ref 98–111)
Creatinine, Ser: 1.98 mg/dL — ABNORMAL HIGH (ref 0.61–1.24)
GFR, Estimated: 34 mL/min — ABNORMAL LOW (ref >60)
Glucose, Bld: 123 mg/dL — ABNORMAL HIGH (ref 70–99)
Potassium: 4.6 mmol/L (ref 3.5–5.1)
Sodium: 138 mmol/L (ref 135–145)

## 2021-01-28 LAB — GLUCOSE, CAPILLARY: Glucose-Capillary: 104 mg/dL — ABNORMAL HIGH (ref 70–99)

## 2021-01-28 LAB — LACTIC ACID, PLASMA
Lactic Acid, Venous: 1.6 mmol/L (ref 0.5–1.9)
Lactic Acid, Venous: 0.7 mmol/L (ref 0.5–1.9)

## 2021-01-28 LAB — RESP PANEL BY RT-PCR (FLU A&B, COVID) ARPGX2
Influenza A by PCR: NEGATIVE
Influenza B by PCR: NEGATIVE
SARS Coronavirus 2 by RT PCR: NEGATIVE

## 2021-01-28 LAB — CBG MONITORING, ED: Glucose-Capillary: 118 mg/dL — ABNORMAL HIGH (ref 70–99)

## 2021-01-28 MED ORDER — SERTRALINE HCL 50 MG PO TABS
150.0000 mg | ORAL_TABLET | Freq: Every day | ORAL | Status: DC
  Administered 2021-01-29 – 2021-01-31 (×3): 150 mg via ORAL
  Filled 2021-01-28 (×3): qty 1

## 2021-01-28 MED ORDER — SERTRALINE HCL 50 MG PO TABS: 150.0000 mg | ORAL_TABLET | Freq: Every day | ORAL | Status: DC

## 2021-01-28 MED ORDER — SIMVASTATIN 20 MG PO TABS: 10.0000 mg | ORAL_TABLET | Freq: Every day | ORAL | Status: DC

## 2021-01-28 MED ORDER — SODIUM CHLORIDE 0.9% FLUSH
3.0000 mL | Freq: Two times a day (BID) | INTRAVENOUS | Status: DC
  Administered 2021-01-29 – 2021-01-30 (×3): 3 mL via INTRAVENOUS

## 2021-01-28 MED ORDER — SODIUM CHLORIDE 0.9 % IV BOLUS
500.0000 mL | Freq: Once | INTRAVENOUS | Status: AC
  Administered 2021-01-28: 500 mL via INTRAVENOUS

## 2021-01-28 MED ORDER — SODIUM CHLORIDE 0.9 % IV SOLN: INTRAVENOUS | Status: DC

## 2021-01-28 MED ORDER — VITAMIN B-12 1000 MCG PO TABS
1000.0000 ug | ORAL_TABLET | Freq: Every day | ORAL | Status: DC
  Administered 2021-01-29 – 2021-01-31 (×4): 1000 ug via ORAL
  Filled 2021-01-28 (×4): qty 1

## 2021-01-28 MED ORDER — CARBIDOPA-LEVODOPA 25-100 MG PO TABS
1.0000 | ORAL_TABLET | Freq: Three times a day (TID) | ORAL | Status: DC
  Administered 2021-01-29 – 2021-01-31 (×9): 1 via ORAL
  Filled 2021-01-28 (×9): qty 1

## 2021-01-28 MED ORDER — FENOFIBRATE 54 MG PO TABS: 54.0000 mg | ORAL_TABLET | Freq: Every day | ORAL | Status: DC

## 2021-01-28 MED ORDER — APIXABAN 2.5 MG PO TABS
2.5000 mg | ORAL_TABLET | Freq: Two times a day (BID) | ORAL | Status: DC
  Administered 2021-01-29 – 2021-01-31 (×6): 2.5 mg via ORAL
  Filled 2021-01-28 (×6): qty 1

## 2021-01-28 NOTE — ED Provider Notes (Signed)
I received this patient in signout from Dr. Dina Rich.  Briefly, he had presented with syncopal episode in a chair, no trauma or seizure activity.  At time of signout, awaiting lab work prior to admission.  Lab work notable for AKI with creatinine of 2.  Patient's blood pressure has remained normal after fluid resuscitation and no other signs or symptoms to suggest sepsis.  Discussed admission with [facility] hospitalist, Dr. Waldron Labs, for syncope and AKI.   Lanah Steines, Wenda Overland, MD 01/28/21 [phone]
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
## 2021-01-28 NOTE — ED Notes (Signed)
This RN attempted to call report to floor after not receiving callback. Person answering phone states that receiving nurse is in room with patient. This RN expressed concern and name and contact number for this RN left for callback.

## 2021-01-28 NOTE — Progress Notes (Signed)
PHARMACY NOTE:  APIXABAN DOSAGE ADJUSTMENT  Apixaban 5mg  BID ordered as continuation of home therapy for AF. Patient is 80 yo and SCr is 1.98.   Reduced dose to 2.5mg  following discussion with MD Shalhoub. Patient may qualify for increase in dose should renal function improve. Pharmacy will continue to monitor.  Lorelei Pont, PharmD, BCPS 01/28/2021 9:33 PM ED Clinical Pharmacist -  [phone]
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
## 2021-01-28 NOTE — ED Notes (Signed)
Pt BP has increased. HOB at 30 degree.

## 2021-01-28 NOTE — ED Notes (Addendum)
Daughter [name]) is at the bedside.

## 2021-01-28 NOTE — ED Notes (Signed)
Attempted to call report to receiving nurse. Per person answering unit phone,Frida, nurse is in with a patient at this time. Name and numberof this RN left for call back.

## 2021-01-28 NOTE — ED Notes (Signed)
Pt tried to pee and was unsuccessful.
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
## 2021-01-29 ENCOUNTER — Encounter (HOSPITAL_COMMUNITY): Payer: Self-pay | Admitting: Internal Medicine

## 2021-01-29 ENCOUNTER — Observation Stay (HOSPITAL_BASED_OUTPATIENT_CLINIC_OR_DEPARTMENT_OTHER): Payer: Medicare HMO

## 2021-01-29 DIAGNOSIS — I951 Orthostatic hypotension: Secondary | ICD-10-CM | POA: Diagnosis present

## 2021-01-29 DIAGNOSIS — R55 Syncope and collapse: Secondary | ICD-10-CM

## 2021-01-29 DIAGNOSIS — R7989 Other specified abnormal findings of blood chemistry: Secondary | ICD-10-CM

## 2021-01-29 DIAGNOSIS — I714 Abdominal aortic aneurysm, without rupture: Secondary | ICD-10-CM | POA: Diagnosis present

## 2021-01-29 DIAGNOSIS — I129 Hypertensive chronic kidney disease with stage 1 through stage 4 chronic kidney disease, or unspecified chronic kidney disease: Secondary | ICD-10-CM | POA: Diagnosis present

## 2021-01-29 DIAGNOSIS — N179 Acute kidney failure, unspecified: Secondary | ICD-10-CM | POA: Diagnosis present

## 2021-01-29 DIAGNOSIS — G20 Parkinson's disease: Secondary | ICD-10-CM

## 2021-01-29 DIAGNOSIS — E860 Dehydration: Secondary | ICD-10-CM | POA: Diagnosis present

## 2021-01-29 DIAGNOSIS — F319 Bipolar disorder, unspecified: Secondary | ICD-10-CM | POA: Diagnosis present

## 2021-01-29 DIAGNOSIS — I480 Paroxysmal atrial fibrillation: Secondary | ICD-10-CM | POA: Diagnosis present

## 2021-01-29 DIAGNOSIS — Z818 Family history of other mental and behavioral disorders: Secondary | ICD-10-CM | POA: Diagnosis not present

## 2021-01-29 DIAGNOSIS — Z811 Family history of alcohol abuse and dependence: Secondary | ICD-10-CM | POA: Diagnosis not present

## 2021-01-29 DIAGNOSIS — Z96641 Presence of right artificial hip joint: Secondary | ICD-10-CM | POA: Diagnosis present

## 2021-01-29 DIAGNOSIS — Z6836 Body mass index (BMI) 36.0-36.9, adult: Secondary | ICD-10-CM | POA: Diagnosis not present

## 2021-01-29 DIAGNOSIS — Z20822 Contact with and (suspected) exposure to covid-19: Secondary | ICD-10-CM | POA: Diagnosis present

## 2021-01-29 DIAGNOSIS — G903 Multi-system degeneration of the autonomic nervous system: Secondary | ICD-10-CM | POA: Diagnosis present

## 2021-01-29 DIAGNOSIS — Z7901 Long term (current) use of anticoagulants: Secondary | ICD-10-CM | POA: Diagnosis not present

## 2021-01-29 DIAGNOSIS — Z79899 Other long term (current) drug therapy: Secondary | ICD-10-CM | POA: Diagnosis not present

## 2021-01-29 DIAGNOSIS — E669 Obesity, unspecified: Secondary | ICD-10-CM | POA: Diagnosis present

## 2021-01-29 DIAGNOSIS — N1831 Chronic kidney disease, stage 3a: Secondary | ICD-10-CM

## 2021-01-29 DIAGNOSIS — D631 Anemia in chronic kidney disease: Secondary | ICD-10-CM | POA: Diagnosis present

## 2021-01-29 DIAGNOSIS — F17210 Nicotine dependence, cigarettes, uncomplicated: Secondary | ICD-10-CM | POA: Diagnosis present

## 2021-01-29 DIAGNOSIS — E7800 Pure hypercholesterolemia, unspecified: Secondary | ICD-10-CM | POA: Diagnosis present

## 2021-01-29 LAB — CBC
HCT: 33.1 % — ABNORMAL LOW (ref 39.0–52.0)
Hemoglobin: 10.4 g/dL — ABNORMAL LOW (ref 13.0–17.0)
MCH: 28.7 pg (ref 26.0–34.0)
MCHC: 31.4 g/dL (ref 30.0–36.0)
MCV: 91.2 fL (ref 80.0–100.0)
Platelets: 139 10*3/uL — ABNORMAL LOW (ref 150–400)
RBC: 3.63 MIL/uL — ABNORMAL LOW (ref 4.22–5.81)
RDW: 13.3 % (ref 11.5–15.5)
WBC: 5.2 10*3/uL (ref 4.0–10.5)
nRBC: 0 % (ref 0.0–0.2)

## 2021-01-29 LAB — ECHOCARDIOGRAM COMPLETE
Area-P 1/2: 2.18 cm2
Height: 72 in
S' Lateral: 3.3 cm
Weight: 3456.81 oz

## 2021-01-29 LAB — HEPATIC FUNCTION PANEL
ALT: <5 U/L (ref 0–44)
AST: 11 U/L — ABNORMAL LOW (ref 15–41)
Albumin: 3.4 g/dL — ABNORMAL LOW (ref 3.5–5.0)
Alkaline Phosphatase: 57 U/L (ref 38–126)
Bilirubin, Direct: <0.1 mg/dL (ref 0.0–0.2)
Total Bilirubin: 0.5 mg/dL (ref 0.3–1.2)
Total Protein: 6.2 g/dL — ABNORMAL LOW (ref 6.5–8.1)

## 2021-01-29 LAB — BASIC METABOLIC PANEL
Anion gap: 6 (ref 5–15)
BUN: 37 mg/dL — ABNORMAL HIGH (ref 8–23)
CO2: 23 mmol/L (ref 22–32)
Calcium: 9.7 mg/dL (ref 8.9–10.3)
Chloride: 108 mmol/L (ref 98–111)
Creatinine, Ser: 1.7 mg/dL — ABNORMAL HIGH (ref 0.61–1.24)
GFR, Estimated: 40 mL/min — ABNORMAL LOW (ref >60)
Glucose, Bld: 99 mg/dL (ref 70–99)
Potassium: 4.5 mmol/L (ref 3.5–5.1)
Sodium: 137 mmol/L (ref 135–145)

## 2021-01-29 LAB — GLUCOSE, CAPILLARY
Glucose-Capillary: 82 mg/dL (ref 70–99)
Glucose-Capillary: 99 mg/dL (ref 70–99)
Glucose-Capillary: 113 mg/dL — ABNORMAL HIGH (ref 70–99)

## 2021-01-29 MED ORDER — RISPERIDONE 0.5 MG PO TABS
0.5000 mg | ORAL_TABLET | Freq: Every day | ORAL | Status: DC
  Administered 2021-01-29 – 2021-01-30 (×2): 0.5 mg via ORAL
  Filled 2021-01-29 (×3): qty 1

## 2021-01-29 NOTE — Evaluation (Signed)
Occupational Therapy Evaluation Patient Details Name: Fernando Carr MRN: [id number] DOB: 1940-09-01 Today's Date: 01/29/2021    History of Present Illness Pt admitted from [facility] after syncopal episode. Found to be hypotensive and admitted with AKI and dehydration. Pt with h/o fall with R hip fx and now s/p R THR by anterior direct approach on 08/14/2020. Pt with hx of CKD, Parkinsons, bipolar, AAA, and A-fib, suicide attempt, depression.   Clinical Impression   Patient is currently requiring assistance with ADLs including total assist with bed level toileting as pt unsafe to pivot to commode today due to drop in BP, min guard assist with LE dressing in sitting and maximum assist with LE dressing in standing, moderate assist with LB bathing, and setup/supervision assist with UE dressing and grooming, all of which is below patient's typical baseline of being Modified independent.  During this evaluation, patient was limited by primarily HTN dropping to hypotension with Orthostatic hypotension testing and need to return to bed for safety as pt remained asymptomatic and unaware of BP changes, as well as possible mild cognitive deficits and motor planning deficits, and UE tremor which has the potential to impact patient's safety and independence during functional mobility, as well as performance for ADLs. [place] "6-clicks" Daily Activity Inpatient Short Form score of 15/24 this session. Patient lives alone at [facility] with PRN supervision and assistance and supervision available for showers which pt has so far declined.  Patient demonstrates good rehab potential, and should benefit from continued skilled occupational therapy services while in acute care to maximize safety, independence and quality of life at home.  Continued occupational therapy services is recommended and whether this will be SNF vs Home Health is TBD based on pt progress.   ?   Follow Up Recommendations   SNF (May be able to change recommendation to Home health OT if pt's BP recovers and pt able to tolerate increased activity without syncope.  TBD per progress)    Equipment Recommendations  3 in 1 bedside commode    Recommendations for Other Services       Precautions / Restrictions Precautions Precautions: Fall Precaution Comments: Mon BP. Restrictions Weight Bearing Restrictions: No      Mobility Bed Mobility Overal bed mobility: Needs Assistance Bed Mobility: Supine to Sit;Sit to Supine     Supine to sit: Min assist Sit to supine: Min assist   General bed mobility comments: Min As for trunk with supine to sit. Pt using abdonimal muscles with apparent diffculty motor planning extremities to complete transition. Min As to raise LEs to bed with sit to supine.    Transfers Overall transfer level: Needs assistance   Transfers: Sit to/from Stand Sit to Stand: Min guard         General transfer comment: Cues for hand placement.    Balance Overall balance assessment: Needs assistance Sitting-balance support: Feet supported;No upper extremity supported Sitting balance-Leahy Scale: Good       Standing balance-Leahy Scale: Poor Standing balance comment: Required BUE support on RW. No external support, pt denied dizziness/lightheadness throughout standing. See BP changes.                           ADL either performed or assessed with clinical judgement   ADL Overall ADL's : Needs assistance/impaired Eating/Feeding: Supervision/ safety;Bed level Eating/Feeding Details (indicate cue type and reason): Needs cues to intiate Grooming: Sitting;Bed level;Set up;Wash/dry hands;Wash/dry face  Upper Body Bathing: Sitting;Bed level;Set up   Lower Body Bathing: Sitting/lateral leans;Moderate assistance   Upper Body Dressing : Min guard Upper Body Dressing Details (indicate cue type and reason): due to tremor Lower Body Dressing: Sitting/lateral leans;Min  guard;Maximal assistance Lower Body Dressing Details (indicate cue type and reason): With increased time, pt able to doff and don socks while EOB with Min guard for safety. In standing, pt unable to releaase either UE from walker and would likely require Max Assist to don/doff clothing over hips.   Toilet Transfer Details (indicate cue type and reason): Unable to pivot due to drop in BP after 3 min stand. Toileting- Clothing Manipulation and Hygiene: Total assistance;Bed level Toileting - Clothing Manipulation Details (indicate cue type and reason): External male catheter.     Functional mobility during ADLs: Minimal assistance;Min guard General ADL Comments: See mobility.  Orthostatics entered in [place]. Sup: 183/97, HR: 55. Sit: 160/110. Initial Stand: 101/63, HR: 62.  3 Min stand: 74/50, HR: 82.  Pt denied any symptoms with each transition.     Vision   Vision Assessment?: No apparent visual deficits Additional Comments: Pt reports visual changes as syncopal episodes begin as a warning.     Perception     Praxis      Pertinent Vitals/Pain Pain Assessment: No/denies pain     Hand Dominance Right   Extremity/Trunk Assessment Upper Extremity Assessment Upper Extremity Assessment: Overall WFL for tasks assessed (BUES: AROM and MMT: WNL. Pt with BUE resting tremor and with pill roll hand tremor.)   Lower Extremity Assessment Lower Extremity Assessment: Defer to PT evaluation   Cervical / Trunk Assessment Cervical / Trunk Assessment: Normal   Communication Communication Communication: No difficulties   Cognition Arousal/Alertness: Awake/alert Behavior During Therapy: WFL for tasks assessed/performed Overall Cognitive Status: No family/caregiver present to determine baseline cognitive functioning Area of Impairment: Problem solving;Memory                     Memory: Decreased short-term memory (Discrepancies of pt's report of home setup and PLOF from his January  Evalution and today's.)       Problem Solving: Slow processing (Needs increased time to answer questions. States that he is "thinking".)     General Comments       Exercises     Shoulder Instructions      Home Living Family/patient expects to be discharged to:: Skilled nursing facility (depending on progress)                                 Additional Comments: patient reports sleeping on his couch as his efficiency at [facility] is too small for a bed. He denied having a recliner, although reported sleeping in a recliner 5 months ago.      Prior Functioning/Environment Level of Independence: Needs assistance  Gait / Transfers Assistance Needed: Pt reports that he never received a 2WW after his hip replacement and has been ambulating with his Rollator at all times. He denies actual falls but endorses lossos of consciousness and reports that his is how his hip was fractured in January. ADL's / Homemaking Assistance Needed: Pt reports that [employer] provides housecleaning 2x/week and all meals and transportation as pt does not drive. Pt reports that he pays for laundry service.   Comments: Uses a rollator. Independent with ADLs. Has a walk in shower with shower chair. Grab bars in shower.  In[facility] Living at [facility].        OT Problem List: Decreased coordination;Cardiopulmonary status limiting activity;Decreased activity tolerance;Decreased safety awareness;Impaired balance (sitting and/or standing);Decreased knowledge of use of DME or AE      OT Treatment/Interventions: Self-care/ADL training;Therapeutic exercise;Therapeutic activities;Cognitive remediation/compensation;Energy conservation;Visual/perceptual remediation/compensation;Patient/family education;DME and/or AE instruction;Balance training    OT Goals(Current goals can be found in the care plan section) Acute Rehab OT Goals Patient Stated Goal: To improve balance and endurance. OT Goal  Formulation: With patient Time For Goal Achievement: 02/12/21 Potential to Achieve Goals: Good ADL Goals Pt Will Perform Grooming: with modified independence;standing (3-4 tasks with vitals stable) Pt Will Perform Lower Body Bathing: with adaptive equipment;sitting/lateral leans;with supervision;sit to/from stand (supervision for showers is available at pt's home.) Pt Will Perform Lower Body Dressing: with modified independence;with adaptive equipment;sitting/lateral leans;sit to/from stand Pt Will Transfer to Toilet: with modified independence;ambulating (VSS) Pt Will Perform Toileting - Clothing Manipulation and hygiene: with modified independence Additional ADL Goal #1: Pt will engage in 10 min standing functional activities with vitals stable, without loss of balance, in order to demonstrate improved activity tolerance and balance needed to perform ADLs safely at home.  OT Frequency: Min 2X/week   Barriers to D/C:    Alone in apartment but superviison available at facility.       Co-evaluation              AM-PAC OT "6 Clicks" Daily Activity     Outcome Measure Help from another person eating meals?: None Help from another person taking care of personal grooming?: A Little Help from another person toileting, which includes using toliet, bedpan, or urinal?: Total Help from another person bathing (including washing, rinsing, drying)?: A Lot Help from another person to put on and taking off regular upper body clothing?: A Little Help from another person to put on and taking off regular lower body clothing?: A Lot 6 Click Score: 15   End of Session Equipment Utilized During Treatment: Rolling walker Nurse Communication:  (RN present for Othostatic Hypotension test)  Activity Tolerance: Treatment limited secondary to medical complications (Comment) (Limited by significant BP drop in standing.) Patient left: in bed;with nursing/sitter in room;with bed alarm set;with call  bell/phone within reach  OT Visit Diagnosis: Unsteadiness on feet (R26.81);History of falling (Z91.81);Other symptoms and signs involving cognitive function                Time: 8938-1017 OT Time Calculation (min): 32 min Charges:  OT General Charges $OT Visit: 1 Visit OT Evaluation $OT Eval Low Complexity: 1 Low OT Treatments $Self Care/Home Management : 8-22 mins  Anderson [place], [place] Office: [phone] 01/29/2021 Julien Girt 01/29/2021, 10:18 AM

## 2021-01-29 NOTE — Progress Notes (Signed)
New Admission Note:   Arrival Method:  Stretcher Mental Orientation: alertx4 Telemetry: box 15 Assessment: Completed Skin: see flowsheet IV: NSL Pain: none Tubes: external catheter Safety Measures: Safety Fall Prevention Plan has been discussed Admission: Completed [facility] Orientation: Patient has been orientated to the room, unit and staff.  Family: none at bedside  Orders have been reviewed and implemented. Will continue to monitor the patient. Call light has been placed within reach and bed alarm has been activated.   Rockie Neighbours BSN, RN Phone number: [phone]

## 2021-01-29 NOTE — Progress Notes (Signed)
  Echocardiogram 2D Echocardiogram has been performed.  Darlina Sicilian M 01/29/2021, 9:10 AM

## 2021-01-29 NOTE — Evaluation (Signed)
Physical Therapy Evaluation Patient Details Name: Fernando Carr MRN: [id number] DOB: 1941-02-25 Today's Date: 01/29/2021   History of Present Illness  Pt is an 80 y/o male presenting to ED on 6/30 for concerns of syncope and hypotension. Of note, pt is well known with dysautonomia per neurology notes.  PMH: Parkinson disease, CKD stage III AAA, AAA, A. fib on Eliquis, hyperlipidemia.  Clinical Impression  Pt demonstrates ability to perform bed mobility, transfers, and ambulate without requiring physical assistance.  Pt tolerates ambulation of household distances with RW this session. Pt is limited in functional mobility and gait due to positive orthostatics and reports difficulty in recognizing when he feels dizzy and when he does not. Pt may benefit from gait assessment with rollator at next session as that is the device he primarily mobilizes. Pt may benefit from ted hose with possible benefit from abdominal binder if ted hose insufficient in improving BP. Pt demonstrates deficit in activity tolerance and safety at this time secondary to positive orthostatics and dizziness and will benefit from acute PT to maximize return to independence. SPT recommends HHPT to aid in improving tolerance to activity.     Follow Up Recommendations Home health PT    Equipment Recommendations  None recommended by PT    Recommendations for Other Services       Precautions / Restrictions Precautions Precautions: Fall Precaution Comments: monitor BP; orthostatic Restrictions Weight Bearing Restrictions: No      Mobility  Bed Mobility Overal bed mobility: Needs Assistance Bed Mobility: Supine to Sit     Supine to sit: Min guard;HOB elevated Sit to supine: Min assist   General bed mobility comments: min G for safety with dizziness initally upon sitting with improvement.    Transfers Overall transfer level: Needs assistance Equipment used: Rolling walker (2 wheeled) Transfers: Sit to/from Stand Sit  to Stand: Min guard (3x)         General transfer comment: Cues for hand placement.  Ambulation/Gait Ambulation/Gait assistance: Min guard Gait Distance (Feet): 80 Feet Assistive device: Rolling walker (2 wheeled) Gait Pattern/deviations: Shuffle;Step-to pattern;Trunk flexed Gait velocity: reduced Gait velocity interpretation: 1.31 - 2.62 ft/sec, indicative of limited community ambulator General Gait Details: slow step-to shuffling gait  Stairs            Wheelchair Mobility    Modified Rankin (Stroke Patients Only)       Balance Overall balance assessment: Needs assistance Sitting-balance support: Feet supported Sitting balance-Leahy Scale: Good     Standing balance support: During functional activity Standing balance-Leahy Scale: Poor Standing balance comment: reliant on bilateral UE support from RW                             Pertinent Vitals/Pain Pain Assessment: No/denies pain    Home Living Family/patient expects to be discharged to:: Other (Comment) (Independent living facility, [facility])               Home Equipment: Bedside commode;Shower seat;Walker - 4 wheels;Wheelchair - manual;Grab bars - tub/shower;Grab bars - toilet Additional Comments: patient reports sleeping on his couch as his efficiency at [facility] is too small for a bed. He denied having a recliner, although reported sleeping in a recliner 5 months ago.    Prior Function Level of Independence: Independent with assistive device(s)   Gait / Transfers Assistance Needed: Pt reports ambulating with his rollator  ADL's / Homemaking Assistance Needed: Pt reports to be independent  with bathing and dressing.  Comments: Pt reports ambulating with rollator, taking "laps". Pt independent with ADLs.     Hand Dominance   Dominant Hand: Right    Extremity/Trunk Assessment   Upper Extremity Assessment Upper Extremity Assessment: Defer to OT evaluation    Lower  Extremity Assessment Lower Extremity Assessment: Overall WFL for tasks assessed    Cervical / Trunk Assessment Cervical / Trunk Assessment: Normal  Communication   Communication: No difficulties  Cognition Arousal/Alertness: Awake/alert Behavior During Therapy: WFL for tasks assessed/performed Overall Cognitive Status: No family/caregiver present to determine baseline cognitive functioning Area of Impairment: Problem solving;Awareness                     Memory: Decreased short-term memory (Discrepancies of pt's report of home setup and PLOF from his January Evalution and today's.)     Awareness: Emergent Problem Solving: Slow processing;Requires verbal cues General Comments: Pt states he is uncertain whether he feels dizzy or not when asked.      General Comments General comments (skin integrity, edema, etc.): BP supine (HOB elevated 45) 83/52, HR- 53; BP seated with initial reports of dizziness- 102/60, HR- 55; BP seated after transfer to recliner- 87/47, HR- 55; 1 min of sitting in recliner BP- 89/66, HR- 60; BP following transfer to BSC- 100/56, HR- 58; BP following gait 79/44, HR- 62; BP at end of session- 86/65, nurse aware of lower BP and agreeable for pt to remain in recliner. Pt reports difficulty in recognizing whether or not he feels dizzy during session    Exercises     Assessment/Plan    PT Assessment Patient needs continued PT services  PT Problem List Decreased mobility;Decreased activity tolerance;Decreased safety awareness;Decreased knowledge of precautions;Cardiopulmonary status limiting activity       PT Treatment Interventions DME instruction;Gait training;Functional mobility training;Therapeutic activities;Therapeutic exercise;Balance training;Patient/family education    PT Goals (Current goals can be found in the Care Plan section)  Acute Rehab PT Goals Patient Stated Goal: get better and go home PT Goal Formulation: With patient Time For Goal  Achievement: 02/12/21 Potential to Achieve Goals: Good    Frequency Min 3X/week   Barriers to discharge        Co-evaluation               AM-PAC PT "6 Clicks" Mobility  Outcome Measure Help needed turning from your back to your side while in a flat bed without using bedrails?: None Help needed moving from lying on your back to sitting on the side of a flat bed without using bedrails?: None Help needed moving to and from a bed to a chair (including a wheelchair)?: A Little Help needed standing up from a chair using your arms (e.g., wheelchair or bedside chair)?: A Little Help needed to walk in hospital room?: A Little Help needed climbing 3-5 steps with a railing? : A Lot 6 Click Score: 19    End of Session Equipment Utilized During Treatment: Gait belt Activity Tolerance: Patient tolerated treatment well Patient left: in chair;with call bell/phone within reach;with chair alarm set Nurse Communication: Mobility status;Other (comment) (orthostatic) PT Visit Diagnosis: Other abnormalities of gait and mobility (R26.89);Other symptoms and signs involving the nervous system (R29.898);Dizziness and giddiness (R42)    Time: 1003-1110 PT Time Calculation (min) (ACUTE ONLY): 67 min   Charges:   PT Evaluation $PT Eval Low Complexity: 1 Low          Acute Rehab  Pager: [phone]  Garwin Brothers, SPT  01/29/2021, 1:36 PM

## 2021-01-29 NOTE — Progress Notes (Signed)
PROGRESS NOTE  Fernando Carr IFO:[id number] DOB: 1941/02/21   PCP: [name], MD  Patient is from: Senior living facility  DOA: 01/28/2021 LOS: 0  Chief complaints:  Chief Complaint  Patient presents with   Loss of Consciousness     Brief Narrative / Interim history: 80 year old male with PMH of Parkinson disease, dysautonomia, CKD-3A, AAA, A. fib not compliant with Eliquis, HLD and tobacco use disorder brought to ED by EMS with syncopal episodes and hypotension, and admitted for syncope, orthostatic hypotension and AKI.  Had his Sinemet increased from 3 times daily to 4 times daily about a week ago.  Had witnessed syncope by EMS.  SBP in 60s per EMS but improved with IV fluid.   Subjective: Seen and examined earlier this morning.  No major events overnight of this morning.  He says he feels "iffy" but could not elaborate.  He denies dizziness, palpitation, chest pain, dyspnea, GI or UTI symptoms.  Per therapy, he is positive for orthostatic hypotension.  Objective: Vitals:   01/29/21 0009 01/29/21 0100 01/29/21 0417 01/29/21 0924  BP: (!) 166/81 (!) 156/76 (!) 158/72 (!) 171/88  Pulse: (!) 55 (!) 54 60 82  Resp: 18 14 18 19   Temp: 97.8 F (36.6 C) 97.6 F (36.4 C) 97.7 F (36.5 C) 98.2 F (36.8 C)  TempSrc: Oral Oral Oral Oral  SpO2: 98% 95% 94% 99%  Weight:   98 kg   Height:        Intake/Output Summary (Last 24 hours) at 01/29/2021 1316 Last data filed at 01/29/2021 0800 Gross per 24 hour  Intake 1631.42 ml  Output 400 ml  Net 1231.42 ml   Filed Weights   01/28/21 1521 01/29/21 0417  Weight: 98.9 kg 98 kg    Examination:  GENERAL: No apparent distress.  Nontoxic. HEENT: MMM.  Vision and hearing grossly intact.  NECK: Supple.  No apparent JVD.  RESP: On RA.  No IWOB.  Fair aeration bilaterally. CVS:  RRR. Heart sounds normal.  ABD/GI/GU: BS+. Abd soft, NTND.  MSK/EXT:  Moves extremities. No apparent deformity. No edema.  SKIN: no apparent skin lesion or  wound NEURO: Awake, alert and oriented appropriately.  Resting tremor mainly in LUE.  No apparent focal neuro deficit. PSYCH: Calm. Normal affect.   Procedures:  None  Microbiology summarized: MVEHM-09 and influenza PCR nonreactive.  Assessment & Plan: Syncope: Multifactorial including dysautonomia from PD, increased dose of Sinemet and dehydration.  His Sinemet was increased from 3 times daily to 4 times daily about a week ago.  Doubt this is related to his A. fib.  Orthostatic vitals positive with systolic dropping from 470 lying to 74 after 3 minute standing.  TTE with LVEF of 60 to 65% and no other significant finding. -Continue Sinemet 3 times daily dose -Continue IV fluid -Needs TED hose to his thigh  AKI on CKD-3A/azotemia: Likely prerenal.  Has been drinking teas instead of water.  Improving. Recent Labs    08/15/20 0959 08/16/20 0530 08/17/20 0643 08/18/20 0539 08/19/20 0447 08/20/20 0514 08/21/20 0456 08/23/20 0445 01/28/21 1444 01/29/21 0030  BUN 41* 40* 35* 37* 41* 43* 42* 33* 37* 37*  CREATININE 2.24* 1.62* 1.38* 1.46* 1.00 1.39* 1.43* 1.11 1.98* 1.70*  -Continue IV fluid -Recheck in the morning -Avoid nephrotoxic meds   Paroxysmal A. fib: Rate controlled without medications.  Not compliant with Eliquis.  Has been taking baby aspirin.  CHA2DS2-VASc score 2 (for age).  Given low CHA2DS2-VASc score, low A. fib  burden and risk of fall, I think he is better off without anticoagulation.  Patient's daughter and did not pick up the phone to discuss this. -Continue low-dose Eliquis for now.   Parkinson's disease: His Sinemet was increased from 3 times daily to 4 times daily.  Could be contributing to his orthostatic hypotension. -Continue home Sinemet 3 times daily   Depression: Stable.  He is on relatively high dose of Zoloft -Continue home Zoloft.   Hyperlipidemia: He is on Zocor and fenofibrate.  No history of this is indicated -Continue holding both   Tobacco  abuse -Counseled. -Nicotine patch    Overweight Body mass index is 29.3 kg/m.         DVT prophylaxis:  apixaban (ELIQUIS) tablet 2.5 mg Start: 01/28/21 2200 SCDs Start: 01/28/21 1918 Place TED hose Start: 01/28/21 1633 apixaban (ELIQUIS) tablet 2.5 mg  Code Status: Full code Family Communication: Patient and/or RN.  Patient's daughter did not pick up the phone. Level of care: Telemetry Medical Status is: Observation  The patient will require care spanning > 2 midnights and should be moved to inpatient because: Hemodynamically unstable, Unsafe d/c plan, IV treatments appropriate due to intensity of illness or inability to take PO, and Inpatient level of care appropriate due to severity of illness  Dispo: The patient is from:  SLF              Anticipated d/c is to: SNF              Patient currently is not medically stable to d/c.   Difficult to place patient No       Consultants:  None   Sch Meds:  Scheduled Meds:  apixaban  2.5 mg Oral BID   carbidopa-levodopa  1 tablet Oral TID   sertraline  150 mg Oral Daily   sodium chloride flush  3 mL Intravenous Q12H   vitamin B-12  1,000 mcg Oral Daily   Continuous Infusions:  sodium chloride 75 mL/hr at 01/28/21 1712   PRN Meds:.  Antimicrobials: Anti-infectives (From admission, onward)    None        I have personally reviewed the following labs and images: CBC: Recent Labs  Lab 01/28/21 1444 01/29/21 0030  WBC 4.6 5.2  HGB 10.5* 10.4*  HCT 34.2* 33.1*  MCV 93.4 91.2  PLT 158 139*   BMP &GFR Recent Labs  Lab 01/28/21 1444 01/29/21 0030  NA 138 137  K 4.6 4.5  CL 108 108  CO2 21* 23  GLUCOSE 123* 99  BUN 37* 37*  CREATININE 1.98* 1.70*  CALCIUM 9.6 9.7   Estimated Creatinine Clearance: 42.1 mL/min (A) (by C-G formula based on SCr of 1.7 mg/dL (H)). Liver & Pancreas: Recent Labs  Lab 01/29/21 0030  AST 11*  ALT <5  ALKPHOS 57  BILITOT 0.5  PROT 6.2*  ALBUMIN 3.4*   No results  for input(s): LIPASE, AMYLASE in the last 168 hours. No results for input(s): AMMONIA in the last 168 hours. Diabetic: No results for input(s): HGBA1C in the last 72 hours. Recent Labs  Lab 01/28/21 1519 01/28/21 2202 01/29/21 0642 01/29/21 1152  GLUCAP 118* 104* 82 99   Cardiac Enzymes: No results for input(s): CKTOTAL, CKMB, CKMBINDEX, TROPONINI in the last 168 hours. No results for input(s): PROBNP in the last 8760 hours. Coagulation Profile: No results for input(s): INR, PROTIME in the last 168 hours. Thyroid Function Tests: No results for input(s): TSH, T4TOTAL, FREET4, T3FREE, THYROIDAB in the  last 72 hours. Lipid Profile: No results for input(s): CHOL, HDL, LDLCALC, TRIG, CHOLHDL, LDLDIRECT in the last 72 hours. Anemia Panel: No results for input(s): VITAMINB12, FOLATE, FERRITIN, TIBC, IRON, RETICCTPCT in the last 72 hours. Urine analysis:    Component Value Date/Time   COLORURINE YELLOW 01/28/2021 [place] 01/28/2021 1717   LABSPEC 1.011 01/28/2021 1717   PHURINE 6.0 01/28/2021 1717   GLUCOSEU 50 (A) 01/28/2021 1717   HGBUR NEGATIVE 01/28/2021 1717   BILIRUBINUR NEGATIVE 01/28/2021 [place] 01/28/2021 1717   PROTEINUR 30 (A) 01/28/2021 1717   UROBILINOGEN 1.0 08/19/2014 1745   NITRITE NEGATIVE 01/28/2021 1717   LEUKOCYTESUR NEGATIVE 01/28/2021 1717   Sepsis Labs: Invalid input(s): PROCALCITONIN, [place]  Microbiology: Recent Results (from the past 240 hour(s))  Resp Panel by RT-PCR (Flu A&B, Covid) Nasopharyngeal Swab     Status: None   Collection Time: 01/28/21  4:49 PM   Specimen: Nasopharyngeal Swab; Nasopharyngeal(NP) swabs in vial transport medium  Result Value Ref Range Status   SARS Coronavirus 2 by RT PCR NEGATIVE NEGATIVE Final    Comment: (NOTE) SARS-CoV-2 target nucleic acids are NOT DETECTED.  The SARS-CoV-2 RNA is generally detectable in upper respiratory specimens during the acute phase of infection. The  lowest concentration of SARS-CoV-2 viral copies this assay can detect is 138 copies/mL. A negative result does not preclude SARS-Cov-2 infection and should not be used as the sole basis for treatment or other patient management decisions. A negative result may occur with  improper specimen collection/handling, submission of specimen other than nasopharyngeal swab, presence of viral mutation(s) within the areas targeted by this assay, and inadequate number of viral copies(<138 copies/mL). A negative result must be combined with clinical observations, patient history, and epidemiological information. The expected result is Negative.  Fact Sheet for Patients:  EntrepreneurPulse.com.au  Fact Sheet for Healthcare Providers:  IncredibleEmployment.be  This test is no t yet approved or cleared by the [place] FDA and  has been authorized for detection and/or diagnosis of SARS-CoV-2 by FDA under an Emergency Use Authorization (EUA). This EUA will remain  in effect (meaning this test can be used) for the duration of the COVID-19 declaration under Section 564(b)(1) of the Act, 21 U.S.C.section 360bbb-3(b)(1), unless the authorization is terminated  or revoked sooner.       Influenza A by PCR NEGATIVE NEGATIVE Final   Influenza B by PCR NEGATIVE NEGATIVE Final    Comment: (NOTE) The Xpert Xpress SARS-CoV-2/FLU/RSV plus assay is intended as an aid in the diagnosis of influenza from Nasopharyngeal swab specimens and should not be used as a sole basis for treatment. Nasal washings and aspirates are unacceptable for Xpert Xpress SARS-CoV-2/FLU/RSV testing.  Fact Sheet for Patients: EntrepreneurPulse.com.au  Fact Sheet for Healthcare Providers: IncredibleEmployment.be  This test is not yet approved or cleared by the [place] FDA and has been authorized for detection and/or diagnosis of SARS-CoV-2 by FDA under  an Emergency Use Authorization (EUA). This EUA will remain in effect (meaning this test can be used) for the duration of the COVID-19 declaration under Section 564(b)(1) of the Act, 21 U.S.C. section 360bbb-3(b)(1), unless the authorization is terminated or revoked.  Performed at [facility], [place] [address] [place], [place] [address]     Radiology Studies: ECHOCARDIOGRAM COMPLETE  Result Date: 01/29/2021    ECHOCARDIOGRAM REPORT   Patient Name:   TAYVIAN HOLYCROSS Date of Exam: 01/29/2021 Medical Rec #:  [id number]   Height:  72.0 in Accession #:    [id number]  Weight:       216.0 lb Date of Birth:  1940/12/12   BSA:          2.202 m Patient Age:    17 years    BP:           158/72 mmHg Patient Gender: M           HR:           60 bpm. Exam Location:  Inpatient Procedure: 2D Echo, Cardiac Doppler and Color Doppler Indications:    Syncope R55  History:        Patient has prior history of Echocardiogram examinations, most                 recent 09/27/2019. Abnormal ECG, Signs/Symptoms:Syncope;                 Risk Factors:Hypertension, Dyslipidemia and Current Smoker.                 Parkinson's disease.  Sonographer:    Darlina Sicilian RDCS Referring Phys: [place]  1. Left ventricular ejection fraction, by estimation, is 60 to 65%. The left ventricle has normal function. The left ventricle has no regional wall motion abnormalities. Left ventricular diastolic parameters are indeterminate.  2. Right ventricular systolic function is normal. The right ventricular size is normal.  3. The mitral valve is grossly normal. No evidence of mitral valve regurgitation.  4. The aortic valve is grossly normal. Aortic valve regurgitation is not visualized. FINDINGS  Left Ventricle: Left ventricular ejection fraction, by estimation, is 60 to 65%. The left ventricle has normal function. The left ventricle has no regional wall motion abnormalities. The left ventricular internal cavity size was  normal in size. There is  no left ventricular hypertrophy. Left ventricular diastolic parameters are indeterminate. Indeterminate filling pressures. Right Ventricle: The right ventricular size is normal. No increase in right ventricular wall thickness. Right ventricular systolic function is normal. Left Atrium: Left atrial size was normal in size. Right Atrium: Right atrial size was normal in size. Pericardium: There is no evidence of pericardial effusion. Mitral Valve: The mitral valve is grossly normal. No evidence of mitral valve regurgitation. Tricuspid Valve: The tricuspid valve is not well visualized. Tricuspid valve regurgitation is trivial. Aortic Valve: The aortic valve is grossly normal. Aortic valve regurgitation is not visualized. Pulmonic Valve: The pulmonic valve was not well visualized. Pulmonic valve regurgitation is not visualized. Aorta: The ascending aorta was not well visualized. IAS/Shunts: The atrial septum is grossly normal.  LEFT VENTRICLE PLAX 2D LVIDd:         5.00 cm  Diastology LVIDs:         3.30 cm  LV e' medial:    4.33 cm/s LV PW:         0.80 cm  LV E/e' medial:  16.4 LV IVS:        0.70 cm  LV e' lateral:   5.96 cm/s LVOT diam:     2.10 cm  LV E/e' lateral: 11.9 LV SV:         85 LV SV Index:   39 LVOT Area:     3.46 cm  RIGHT VENTRICLE RV S prime:     14.30 cm/s TAPSE (M-mode): 2.2 cm LEFT ATRIUM             Index       RIGHT ATRIUM  Index LA diam:        3.70 cm 1.68 cm/m  RA Area:     12.90 cm LA Vol (A2C):   71.5 ml 32.48 ml/m RA Volume:   29.00 ml  13.17 ml/m LA Vol (A4C):   47.2 ml 21.44 ml/m LA Biplane Vol: 57.8 ml 26.25 ml/m  AORTIC VALVE LVOT Vmax:   120.00 cm/s LVOT Vmean:  68.900 cm/s LVOT VTI:    0.245 m  AORTA Ao Root diam: 3.70 cm MITRAL VALVE MV Area (PHT): 2.18 cm    SHUNTS MV Decel Time: 348 msec    Systemic VTI:  0.24 m MV E velocity: 71.10 cm/s  Systemic Diam: 2.10 cm MV A velocity: 78.00 cm/s MV E/A ratio:  0.91 Mertie Moores MD Electronically  signed by Mertie Moores MD Signature Date/Time: 01/29/2021/9:32:13 AM    Final       Ambers Iyengar T. [place]  If 7PM-7AM, please contact night-coverage [email or web address] 01/29/2021, 1:16 PM

## 2021-01-30 DIAGNOSIS — D631 Anemia in chronic kidney disease: Secondary | ICD-10-CM

## 2021-01-30 DIAGNOSIS — N189 Chronic kidney disease, unspecified: Secondary | ICD-10-CM

## 2021-01-30 LAB — CBC
HCT: 35.1 % — ABNORMAL LOW (ref 39.0–52.0)
Hemoglobin: 11.1 g/dL — ABNORMAL LOW (ref 13.0–17.0)
MCH: 28.6 pg (ref 26.0–34.0)
MCHC: 31.6 g/dL (ref 30.0–36.0)
MCV: 90.5 fL (ref 80.0–100.0)
Platelets: 135 10*3/uL — ABNORMAL LOW (ref 150–400)
RBC: 3.88 MIL/uL — ABNORMAL LOW (ref 4.22–5.81)
RDW: 13.2 % (ref 11.5–15.5)
WBC: 6 10*3/uL (ref 4.0–10.5)
nRBC: 0 % (ref 0.0–0.2)

## 2021-01-30 LAB — RENAL FUNCTION PANEL
Albumin: 3.7 g/dL (ref 3.5–5.0)
Anion gap: 9 (ref 5–15)
BUN: 35 mg/dL — ABNORMAL HIGH (ref 8–23)
CO2: 22 mmol/L (ref 22–32)
Calcium: 10.2 mg/dL (ref 8.9–10.3)
Chloride: 105 mmol/L (ref 98–111)
Creatinine, Ser: 1.58 mg/dL — ABNORMAL HIGH (ref 0.61–1.24)
GFR, Estimated: 44 mL/min — ABNORMAL LOW (ref >60)
Glucose, Bld: 81 mg/dL (ref 70–99)
Phosphorus: 2.7 mg/dL (ref 2.5–4.6)
Potassium: 4.3 mmol/L (ref 3.5–5.1)
Sodium: 136 mmol/L (ref 135–145)

## 2021-01-30 LAB — GLUCOSE, CAPILLARY
Glucose-Capillary: 86 mg/dL (ref 70–99)
Glucose-Capillary: 93 mg/dL (ref 70–99)

## 2021-01-30 LAB — CK: Total CK: 35 U/L — ABNORMAL LOW (ref 49–397)

## 2021-01-30 LAB — MAGNESIUM: Magnesium: 1.8 mg/dL (ref 1.7–2.4)

## 2021-01-30 MED ORDER — HYDRALAZINE HCL 25 MG PO TABS
25.0000 mg | ORAL_TABLET | Freq: Four times a day (QID) | ORAL | Status: DC | PRN
  Administered 2021-01-31: 25 mg via ORAL
  Filled 2021-01-30: qty 1

## 2021-01-30 NOTE — Plan of Care (Signed)

## 2021-01-30 NOTE — Discharge Instructions (Signed)

## 2021-01-30 NOTE — Progress Notes (Signed)
Physical Therapy Treatment Patient Details Name: Fernando Carr MRN: [id number] DOB: Dec 16, 1940 Today's Date: 01/30/2021    History of Present Illness Pt is an 80 y/o male presenting to ED on 6/30 for concerns of syncope and hypotension. Of note, pt is well known with dysautonomia per neurology notes.  PMH: Parkinson disease, CKD stage III AAA, AAA, A. fib on Eliquis, hyperlipidemia.    PT Comments    Pt supine in bed with thigh high TED hose in place, agreeable to therapy. Collected orthostatic vitals prior to ambulation. Limited in safety by decreased strength and endurance. Pt is currently min guard for bed mobility and transfers and min guard progressing to contact guard assist with ambulation using a Rollator. D/c plans remain appropriate at this time. PT will continue to follow acutely.  Orthostatic VS during treatment session   BP- Lying Pulse- Lying BP- Sitting Pulse- Sitting BP- Standing at 0 minutes Pulse- Standing at 0 minutes BP- Standing at 3 minutes Pulse- Standing at 3 minutes  01/30/21 1500 169/88 62 165/89 67 (!) 154/102 69 (!) 152/133 62    BP after ambulation 125/60 Pulse 62    Follow Up Recommendations  Home health PT     Equipment Recommendations  None recommended by PT       Precautions / Restrictions Precautions Precautions: Fall Precaution Comments: monitor BP; orthostatic Restrictions Weight Bearing Restrictions: No    Mobility  Bed Mobility Overal bed mobility: Needs Assistance Bed Mobility: Supine to Sit     Supine to sit: Min guard;HOB elevated     General bed mobility comments: min G for safety, increased time and effort    Transfers Overall transfer level: Needs assistance Equipment used: Rolling walker (2 wheeled) Transfers: Sit to/from Stand Sit to Stand: Min guard (3x)         General transfer comment: min guard for safety, increased use of bed posteriorly for steadying  Ambulation/Gait Ambulation/Gait assistance: Min assist;Min  guard Gait Distance (Feet): 380 Feet Assistive device: 4-wheeled walker Gait Pattern/deviations: Step-to pattern;Trunk flexed;Festinating Gait velocity: reduced Gait velocity interpretation: 1.31 - 2.62 ft/sec, indicative of limited community ambulator General Gait Details: festinating gait, requiring 1x standing rest break          Balance Overall balance assessment: Needs assistance Sitting-balance support: Feet supported Sitting balance-Leahy Scale: Good     Standing balance support: During functional activity Standing balance-Leahy Scale: Poor Standing balance comment: reliant on bilateral UE support from RW                            Cognition Arousal/Alertness: Awake/alert Behavior During Therapy: WFL for tasks assessed/performed Overall Cognitive Status: No family/caregiver present to determine baseline cognitive functioning Area of Impairment: Problem solving;Awareness                           Awareness: Emergent Problem Solving: Slow processing;Requires verbal cues General Comments: reports he does not know what normal feels like anymore         General Comments General comments (skin integrity, edema, etc.): Pt with better BP during upright activity      Pertinent Vitals/Pain Pain Assessment: No/denies pain     PT Goals (current goals can now be found in the care plan section) Acute Rehab PT Goals Patient Stated Goal: get better and go home PT Goal Formulation: With patient Time For Goal Achievement: 02/12/21 Potential to Achieve Goals: Good  Frequency    Min 3X/week      PT Plan Current plan remains appropriate       AM-PAC PT "6 Clicks" Mobility   Outcome Measure  Help needed turning from your back to your side while in a flat bed without using bedrails?: None Help needed moving from lying on your back to sitting on the side of a flat bed without using bedrails?: None Help needed moving to and from a bed to a  chair (including a wheelchair)?: A Little Help needed standing up from a chair using your arms (e.g., wheelchair or bedside chair)?: A Little Help needed to walk in hospital room?: A Little Help needed climbing 3-5 steps with a railing? : A Lot 6 Click Score: 19    End of Session Equipment Utilized During Treatment: Gait belt Activity Tolerance: Patient tolerated treatment well Patient left: in chair;with call bell/phone within reach;with chair alarm set Nurse Communication: Mobility status;Other (comment) (BPs) PT Visit Diagnosis: Other abnormalities of gait and mobility (R26.89);Other symptoms and signs involving the nervous system (R29.898);Dizziness and giddiness (R42)     Time: 6701-1003 PT Time Calculation (min) (ACUTE ONLY): 36 min  Charges:  $Gait Training: 8-22 mins $Therapeutic Activity: 8-22 mins                     Lamya Lausch B. Migdalia Dk PT, DPT Acute Rehabilitation Services Pager [phone] Office [phone]    [place] 01/30/2021, 4:14 PM

## 2021-01-30 NOTE — Progress Notes (Signed)
PROGRESS NOTE  Fernando Carr KGS:[id number] DOB: 06/23/1941   PCP: [name], MD  Patient is from: Senior living facility  DOA: 01/28/2021 LOS: 1  Chief complaints:  Chief Complaint  Patient presents with   Loss of Consciousness     Brief Narrative / Interim history: 80 year old male with PMH of Parkinson disease, dysautonomia, CKD-3A, AAA, A. fib not compliant with Eliquis, HLD and tobacco use disorder brought to ED by EMS with syncopal episodes and hypotension, and admitted for syncope, orthostatic hypotension and AKI.  Had his Sinemet increased from 3 times daily to 4 times daily about a week ago.  Had witnessed syncope by EMS.  SBP in 60s per EMS but improved with IV fluid.  Orthostatic vitals remain positive.  Subjective: Seen and examined earlier this morning.  No major events overnight of this morning.  No complaints.  He denies chest pain, dyspnea, dizziness, GI or UTI symptoms.   Objective: Vitals:   01/29/21 2103 01/30/21 0535 01/30/21 0900 01/30/21 1644  BP: (!) 152/94 (!) 191/96 (!) 155/82 (!) 163/78  Pulse: (!) 52 64 66 62  Resp: 16 16  18   Temp: 98 F (36.7 C) (!) 97.5 F (36.4 C) 98 F (36.7 C) 98.3 F (36.8 C)  TempSrc: Oral Oral Oral Oral  SpO2: 98% 94% 96% 95%  Weight:      Height:        Intake/Output Summary (Last 24 hours) at 01/30/2021 1707 Last data filed at 01/30/2021 1335 Gross per 24 hour  Intake 1494.01 ml  Output 2325 ml  Net -830.99 ml   Filed Weights   01/28/21 1521 01/29/21 0417  Weight: 98.9 kg 98 kg    Examination:  GENERAL: No apparent distress.  Nontoxic. HEENT: MMM.  Vision and hearing grossly intact.  NECK: Supple.  No apparent JVD.  RESP: On RA.  No IWOB.  Fair aeration bilaterally. CVS:  RRR. Heart sounds normal.  ABD/GI/GU: BS+. Abd soft, NTND.  MSK/EXT:  Moves extremities. No apparent deformity. No edema.  SKIN: no apparent skin lesion or wound NEURO: Awake and alert. Oriented appropriately.  No apparent focal neuro  deficit other than resting tremor. PSYCH: Calm. Normal affect.   Procedures:  None  Microbiology summarized: VOPFY-92 and influenza PCR nonreactive.  Assessment & Plan: Syncope: Multifactorial including dysautonomia from PD, increased dose of Sinemet and dehydration.  His Sinemet was increased from 3 times daily to 4 times daily about a week ago.  Orthostatic vitals positive with systolic dropping from 446 lying to 74 after 3 minute standing.  TTE with LVEF of 60 to 65% and no other significant finding. -Continue Sinemet 3 times daily dose -Continue IV fluid for hydration -Apply TED hose and abdominal binder -OOB/elevate head of bed -Only treat for standing blood pressure. -Continue PT/OT  AKI on CKD-3A/azotemia: Likely prerenal.  Has been drinking teas instead of water.  Improving. Recent Labs    08/16/20 0530 08/17/20 0643 08/18/20 0539 08/19/20 0447 08/20/20 0514 08/21/20 0456 08/23/20 0445 01/28/21 1444 01/29/21 0030 01/30/21 0304  BUN 40* 35* 37* 41* 43* 42* 33* 37* 37* 35*  CREATININE 1.62* 1.38* 1.46* 1.00 1.39* 1.43* 1.11 1.98* 1.70* 1.58*  -Continue IV fluid -Recheck in the morning -Avoid nephrotoxic meds   Paroxysmal A. fib: Rate controlled without medications.  Not compliant with Eliquis.  Has been taking baby aspirin.  CHA2DS2-VASc score 2 (for age).  Given low CHA2DS2-VASc score, low A. fib burden and risk of fall, I think he is better  off without anticoagulation.  Attempted to call patient's daughter for further discussion on 7/1 and 7/2 but no answer. -Continue low-dose Eliquis while in-house.  Anemia of renal disease: H&H stable. Recent Labs    08/19/20 0447 08/20/20 0514 08/21/20 0456 08/23/20 0445 08/24/20 0443 08/25/20 0456 08/26/20 0451 01/28/21 1444 01/29/21 0030 01/30/21 0304  HGB 7.4* 7.6* 7.4* 7.3* 7.1* 8.5* 9.1* 10.5* 10.4* 11.1*  -Continue monitoring   Parkinson's disease: His Sinemet was increased from 3 times daily to 4 times daily.   Could be contributing to his orthostatic hypotension. -Continue home Sinemet 3 times daily   Depression: Stable.  He is on relatively high dose of Zoloft -Continue home Zoloft.   Hyperlipidemia: He is on Zocor and fenofibrate.  No history of this is indicated -Continue holding both   Tobacco abuse -Counseled. -Nicotine patch    Overweight Body mass index is 29.3 kg/m.         DVT prophylaxis:  apixaban (ELIQUIS) tablet 2.5 mg Start: 01/28/21 2200 SCDs Start: 01/28/21 1918 Place TED hose Start: 01/28/21 1633 apixaban (ELIQUIS) tablet 2.5 mg  Code Status: Full code Family Communication: Patient and/or RN.  Patient's daughter did not pick up the phone. Level of care: Telemetry Medical Status is: Inpatient  Remains inpatient appropriate because:Hemodynamically unstable, IV treatments appropriate due to intensity of illness or inability to take PO, and Inpatient level of care appropriate due to severity of illness  Dispo: The patient is from:  SLF              Anticipated d/c is to:  SLF              Patient currently is not medically stable to d/c.   Difficult to place patient No            Consultants:  None   Sch Meds:  Scheduled Meds:  apixaban  2.5 mg Oral BID   carbidopa-levodopa  1 tablet Oral TID   risperiDONE  0.5 mg Oral QHS   sertraline  150 mg Oral Daily   sodium chloride flush  3 mL Intravenous Q12H   vitamin B-12  1,000 mcg Oral Daily   Continuous Infusions:  sodium chloride 75 mL/hr at 01/28/21 1712   PRN Meds:.  Antimicrobials: Anti-infectives (From admission, onward)    None        I have personally reviewed the following labs and images: CBC: Recent Labs  Lab 01/28/21 1444 01/29/21 0030 01/30/21 0304  WBC 4.6 5.2 6.0  HGB 10.5* 10.4* 11.1*  HCT 34.2* 33.1* 35.1*  MCV 93.4 91.2 90.5  PLT 158 139* 135*   BMP &GFR Recent Labs  Lab 01/28/21 1444 01/29/21 0030 01/30/21 0304  NA 138 137 136  K 4.6 4.5 4.3  CL 108  108 105  CO2 21* 23 22  GLUCOSE 123* 99 81  BUN 37* 37* 35*  CREATININE 1.98* 1.70* 1.58*  CALCIUM 9.6 9.7 10.2  MG  --   --  1.8  PHOS  --   --  2.7   Estimated Creatinine Clearance: 45.3 mL/min (A) (by C-G formula based on SCr of 1.58 mg/dL (H)). Liver & Pancreas: Recent Labs  Lab 01/29/21 0030 01/30/21 0304  AST 11*  --   ALT <5  --   ALKPHOS 57  --   BILITOT 0.5  --   PROT 6.2*  --   ALBUMIN 3.4* 3.7   No results for input(s): LIPASE, AMYLASE in the last 168 hours. No results  for input(s): AMMONIA in the last 168 hours. Diabetic: No results for input(s): HGBA1C in the last 72 hours. Recent Labs  Lab 01/29/21 0642 01/29/21 1152 01/29/21 1606 01/30/21 0532 01/30/21 0822  GLUCAP 82 99 113* 86 93   Cardiac Enzymes: Recent Labs  Lab 01/30/21 0304  CKTOTAL 35*   No results for input(s): PROBNP in the last 8760 hours. Coagulation Profile: No results for input(s): INR, PROTIME in the last 168 hours. Thyroid Function Tests: No results for input(s): TSH, T4TOTAL, FREET4, T3FREE, THYROIDAB in the last 72 hours. Lipid Profile: No results for input(s): CHOL, HDL, LDLCALC, TRIG, CHOLHDL, LDLDIRECT in the last 72 hours. Anemia Panel: No results for input(s): VITAMINB12, FOLATE, FERRITIN, TIBC, IRON, RETICCTPCT in the last 72 hours. Urine analysis:    Component Value Date/Time   COLORURINE YELLOW 01/28/2021 [place] 01/28/2021 1717   LABSPEC 1.011 01/28/2021 1717   PHURINE 6.0 01/28/2021 1717   GLUCOSEU 50 (A) 01/28/2021 1717   HGBUR NEGATIVE 01/28/2021 1717   BILIRUBINUR NEGATIVE 01/28/2021 [place] 01/28/2021 1717   PROTEINUR 30 (A) 01/28/2021 1717   UROBILINOGEN 1.0 08/19/2014 1745   NITRITE NEGATIVE 01/28/2021 1717   LEUKOCYTESUR NEGATIVE 01/28/2021 1717   Sepsis Labs: Invalid input(s): PROCALCITONIN, [place]  Microbiology: Recent Results (from the past 240 hour(s))  Resp Panel by RT-PCR (Flu A&B, Covid)  Nasopharyngeal Swab     Status: None   Collection Time: 01/28/21  4:49 PM   Specimen: Nasopharyngeal Swab; Nasopharyngeal(NP) swabs in vial transport medium  Result Value Ref Range Status   SARS Coronavirus 2 by RT PCR NEGATIVE NEGATIVE Final    Comment: (NOTE) SARS-CoV-2 target nucleic acids are NOT DETECTED.  The SARS-CoV-2 RNA is generally detectable in upper respiratory specimens during the acute phase of infection. The lowest concentration of SARS-CoV-2 viral copies this assay can detect is 138 copies/mL. A negative result does not preclude SARS-Cov-2 infection and should not be used as the sole basis for treatment or other patient management decisions. A negative result may occur with  improper specimen collection/handling, submission of specimen other than nasopharyngeal swab, presence of viral mutation(s) within the areas targeted by this assay, and inadequate number of viral copies(<138 copies/mL). A negative result must be combined with clinical observations, patient history, and epidemiological information. The expected result is Negative.  Fact Sheet for Patients:  EntrepreneurPulse.com.au  Fact Sheet for Healthcare Providers:  IncredibleEmployment.be  This test is no t yet approved or cleared by the [place] FDA and  has been authorized for detection and/or diagnosis of SARS-CoV-2 by FDA under an Emergency Use Authorization (EUA). This EUA will remain  in effect (meaning this test can be used) for the duration of the COVID-19 declaration under Section 564(b)(1) of the Act, 21 U.S.C.section 360bbb-3(b)(1), unless the authorization is terminated  or revoked sooner.       Influenza A by PCR NEGATIVE NEGATIVE Final   Influenza B by PCR NEGATIVE NEGATIVE Final    Comment: (NOTE) The Xpert Xpress SARS-CoV-2/FLU/RSV plus assay is intended as an aid in the diagnosis of influenza from Nasopharyngeal swab specimens and should not be  used as a sole basis for treatment. Nasal washings and aspirates are unacceptable for Xpert Xpress SARS-CoV-2/FLU/RSV testing.  Fact Sheet for Patients: EntrepreneurPulse.com.au  Fact Sheet for Healthcare Providers: IncredibleEmployment.be  This test is not yet approved or cleared by the [place] FDA and has been authorized for detection and/or diagnosis of SARS-CoV-2 by FDA under an  Emergency Use Authorization (EUA). This EUA will remain in effect (meaning this test can be used) for the duration of the COVID-19 declaration under Section 564(b)(1) of the Act, 21 U.S.C. section 360bbb-3(b)(1), unless the authorization is terminated or revoked.  Performed at [facility], [place] [address], [place], [place] [address]     Radiology Studies: No results found.    Joceline Hinchcliff T. [place]  If 7PM-7AM, please contact night-coverage [email or web address] 01/30/2021, 5:07 PM

## 2021-01-31 DIAGNOSIS — N179 Acute kidney failure, unspecified: Secondary | ICD-10-CM

## 2021-01-31 DIAGNOSIS — I951 Orthostatic hypotension: Principal | ICD-10-CM

## 2021-01-31 LAB — RENAL FUNCTION PANEL
Albumin: 3.5 g/dL (ref 3.5–5.0)
Anion gap: 6 (ref 5–15)
BUN: 27 mg/dL — ABNORMAL HIGH (ref 8–23)
CO2: 27 mmol/L (ref 22–32)
Calcium: 10.3 mg/dL (ref 8.9–10.3)
Chloride: 103 mmol/L (ref 98–111)
Creatinine, Ser: 1.35 mg/dL — ABNORMAL HIGH (ref 0.61–1.24)
GFR, Estimated: 53 mL/min — ABNORMAL LOW (ref >60)
Glucose, Bld: 84 mg/dL (ref 70–99)
Phosphorus: 2.7 mg/dL (ref 2.5–4.6)
Potassium: 4.4 mmol/L (ref 3.5–5.1)
Sodium: 136 mmol/L (ref 135–145)

## 2021-01-31 LAB — TSH: TSH: 1.157 u[IU]/mL (ref 0.350–4.500)

## 2021-01-31 LAB — GLUCOSE, CAPILLARY: Glucose-Capillary: 98 mg/dL (ref 70–99)

## 2021-01-31 LAB — CBC
HCT: 33.9 % — ABNORMAL LOW (ref 39.0–52.0)
Hemoglobin: 10.6 g/dL — ABNORMAL LOW (ref 13.0–17.0)
MCH: 28.6 pg (ref 26.0–34.0)
MCHC: 31.3 g/dL (ref 30.0–36.0)
MCV: 91.4 fL (ref 80.0–100.0)
Platelets: 153 10*3/uL (ref 150–400)
RBC: 3.71 MIL/uL — ABNORMAL LOW (ref 4.22–5.81)
RDW: 13.2 % (ref 11.5–15.5)
WBC: 5.2 10*3/uL (ref 4.0–10.5)
nRBC: 0 % (ref 0.0–0.2)

## 2021-01-31 LAB — MAGNESIUM: Magnesium: 1.9 mg/dL (ref 1.7–2.4)

## 2021-01-31 LAB — CORTISOL-AM, BLOOD: Cortisol - AM: 23.6 ug/dL — ABNORMAL HIGH (ref 6.7–22.6)

## 2021-01-31 MED ORDER — APIXABAN 5 MG PO TABS: 5.0000 mg | ORAL_TABLET | Freq: Two times a day (BID) | ORAL | 11 refills | Status: DC

## 2021-01-31 MED ORDER — CARBIDOPA-LEVODOPA 25-100 MG PO TABS: 1.0000 | ORAL_TABLET | Freq: Three times a day (TID) | ORAL | Status: DC

## 2021-01-31 NOTE — TOC Transition Note (Signed)
Transition of Care [facility]) - CM/SW Discharge Note   Patient Details  Name: Fernando Carr MRN: [id number] Date of Birth: September 03, 1940  Transition of Care [facility]) CM/SW Contact:  Bartholomew Crews, RN Phone Number: [phone] 01/31/2021, 4:55 PM   Clinical Narrative:     Patient from [place]. HH PT recommendations. HH order faxed to [facility] at [facility] for follow up.   Final next level of care: [place] Barriers to Discharge: No Barriers Identified   Patient Goals and CMS Choice        Discharge Placement                       Discharge Plan and Services                          HH Arranged: PT [place] Agency: Other - See comment Secondary school teacher) Date HH Agency Contacted: 01/31/21 Time HH Agency Contacted: 1300 Representative spoke with at [place]: [place] ([place]) Interventions     Readmission Risk Interventions No flowsheet data found.

## 2021-01-31 NOTE — Plan of Care (Signed)
  Problem: Clinical Measurements: Goal: Ability to maintain clinical measurements within normal limits will improve Outcome: Progressing Goal: Diagnostic test results will improve Outcome: Progressing   Problem: Activity: Goal: Risk for activity intolerance will decrease Outcome: Progressing   Problem: Coping: Goal: Level of anxiety will decrease Outcome: Progressing   Problem: Elimination: Goal: Will not experience complications related to bowel motility Outcome: Progressing

## 2021-01-31 NOTE — Discharge Summary (Signed)
Physician Discharge Summary  Fernando Carr PXT:[id number] DOB: 1940-08-08 DOA: 01/28/2021  PCP: [name], MD  Admit date: 01/28/2021 Discharge date: 01/31/2021  Admitted From: home Discharge disposition: home   Recommendations for Outpatient Follow-Up:   Home health Orthostatic hypotension precautions   Discharge Diagnosis:   Active Problems:   Bipolar 1 disorder ([place])   CRD (chronic renal disease), stage III (HCC)   Hypotension, postural   Syncope   AKI (acute kidney injury) ([place])   Orthostatic hypotension    Discharge Condition: Improved.  Diet recommendation: .  Regular.  Wound care: None.  Code status: Full.   History of Present Illness:   Fernando Carr  is a 80 y.o. male, with past medical history of Parkinson disease, CKD stage III AAA, AAA, A. fib on Eliquis, hyperlipidemia, patient presents to ED with concerns of syncope and hypotension, patient lives in senior living facility, he had his Sinemet increased by neurology from 3 times daily to 4 times daily last week, patient is well with known dysautonomia contributing to syncope per neurology notes, patient report he has been feeling fatigued, he was sitting outside to smoke a cigarette, he sat on the bench, and he was witnessed to have a syncopal event while seated, upon ambulance presentation he was noted to be hypotensive with systolic in the 62V, Ms. also witnessed a second syncopal episode while the patient was seated, he responded to fluid bolus in route, by the time he is in ED he is neurologically at baseline, he denies any new focal deficits, no tingling, no numbness, denies any chest pain, shortness of breath, he does report he felt little bit foggy before the event.  Patient reports he has been holding his Eliquis on his own for last few weeks, and he has been taking aspirin instead without any physician's recommendations. - in ED EKG showing normal sinus rhythm, labs significant for worsening  creatinine at 1.98 from 1.4, lactic acid elevated at 1.6, [facility] hospitalist consulted to admit.   Hospital Course by Problem:   Orthostatic Syncope: Multifactorial including dysautonomia from PD, increased dose of Sinemet and dehydration.  His Sinemet was increased from 3 times daily to 4 times daily about a week ago.  Orthostatic vitals positive with systolic dropping from 035 lying to 74 after 3 minute standing.  TTE with LVEF of 60 to 65% and no other significant finding. -Continue Sinemet 3 times daily dose -Apply TED hose and abdominal binder -orthostatic precuations   AKI on CKD-3A/azotemia: Likely prerenal.  Has been drinking teas instead of water.  Improving. -encouraged hydration   Paroxysmal A. fib: Rate controlled without medications.  Not compliant with Eliquis.  Has been taking baby aspirin.  CHA2DS2-VASc score 2 (for age).   -defer to cards   Anemia of renal disease: H&H stable.   Parkinson's disease: His Sinemet was increased from 3 times daily to 4 times daily.  Could be contributing to his orthostatic hypotension. -Continue home Sinemet 3 times daily   Depression: Stable.  He is on relatively high dose of Zoloft -Continue home Zoloft.   Hyperlipidemia: He is on Zocor and fenofibrate.     Tobacco abuse -Counseled. -Nicotine patch    Medical Consultants:      Discharge Exam:   Vitals:   01/31/21 0447 01/31/21 0926  BP: (!) 150/71 (!) 144/70  Pulse: (!) 52 60  Resp: 18 18  Temp: (!) 97.5 F (36.4 C) (!) 97.4 F (36.3 C)  SpO2:  100%   Vitals:   01/30/21 0900 01/30/21 1644 01/31/21 0447 01/31/21 0926  BP: (!) 155/82 (!) 163/78 (!) 150/71 (!) 144/70  Pulse: 66 62 (!) 52 60  Resp:  18 18 18   Temp: 98 F (36.7 C) 98.3 F (36.8 C) (!) 97.5 F (36.4 C) (!) 97.4 F (36.3 C)  TempSrc: Oral Oral Oral Oral  SpO2: 96% 95%  100%  Weight:      Height:        General exam: Appears calm and comfortable.  The results of significant diagnostics from this  hospitalization (including imaging, microbiology, ancillary and laboratory) are listed below for reference.     Procedures and Diagnostic Studies:   ECHOCARDIOGRAM COMPLETE  Result Date: 01/29/2021    ECHOCARDIOGRAM REPORT   Patient Name:   Fernando Carr Date of Exam: 01/29/2021 Medical Rec #:  [id number]   Height:       72.0 in Accession #:    [id number]  Weight:       216.0 lb Date of Birth:  May 18, 1941   BSA:          2.202 m Patient Age:    48 years    BP:           158/72 mmHg Patient Gender: M           HR:           60 bpm. Exam Location:  Inpatient Procedure: 2D Echo, Cardiac Doppler and Color Doppler Indications:    Syncope R55  History:        Patient has prior history of Echocardiogram examinations, most                 recent 09/27/2019. Abnormal ECG, Signs/Symptoms:Syncope;                 Risk Factors:Hypertension, Dyslipidemia and Current Smoker.                 Parkinson's disease.  Sonographer:    Darlina Sicilian RDCS Referring Phys: [place]  1. Left ventricular ejection fraction, by estimation, is 60 to 65%. The left ventricle has normal function. The left ventricle has no regional wall motion abnormalities. Left ventricular diastolic parameters are indeterminate.  2. Right ventricular systolic function is normal. The right ventricular size is normal.  3. The mitral valve is grossly normal. No evidence of mitral valve regurgitation.  4. The aortic valve is grossly normal. Aortic valve regurgitation is not visualized. FINDINGS  Left Ventricle: Left ventricular ejection fraction, by estimation, is 60 to 65%. The left ventricle has normal function. The left ventricle has no regional wall motion abnormalities. The left ventricular internal cavity size was normal in size. There is  no left ventricular hypertrophy. Left ventricular diastolic parameters are indeterminate. Indeterminate filling pressures. Right Ventricle: The right ventricular size is normal. No increase in right  ventricular wall thickness. Right ventricular systolic function is normal. Left Atrium: Left atrial size was normal in size. Right Atrium: Right atrial size was normal in size. Pericardium: There is no evidence of pericardial effusion. Mitral Valve: The mitral valve is grossly normal. No evidence of mitral valve regurgitation. Tricuspid Valve: The tricuspid valve is not well visualized. Tricuspid valve regurgitation is trivial. Aortic Valve: The aortic valve is grossly normal. Aortic valve regurgitation is not visualized. Pulmonic Valve: The pulmonic valve was not well visualized. Pulmonic valve regurgitation is not visualized. Aorta: The ascending aorta was not well visualized. IAS/Shunts:  The atrial septum is grossly normal.  LEFT VENTRICLE PLAX 2D LVIDd:         5.00 cm  Diastology LVIDs:         3.30 cm  LV e' medial:    4.33 cm/s LV PW:         0.80 cm  LV E/e' medial:  16.4 LV IVS:        0.70 cm  LV e' lateral:   5.96 cm/s LVOT diam:     2.10 cm  LV E/e' lateral: 11.9 LV SV:         85 LV SV Index:   39 LVOT Area:     3.46 cm  RIGHT VENTRICLE RV S prime:     14.30 cm/s TAPSE (M-mode): 2.2 cm LEFT ATRIUM             Index       RIGHT ATRIUM           Index LA diam:        3.70 cm 1.68 cm/m  RA Area:     12.90 cm LA Vol (A2C):   71.5 ml 32.48 ml/m RA Volume:   29.00 ml  13.17 ml/m LA Vol (A4C):   47.2 ml 21.44 ml/m LA Biplane Vol: 57.8 ml 26.25 ml/m  AORTIC VALVE LVOT Vmax:   120.00 cm/s LVOT Vmean:  68.900 cm/s LVOT VTI:    0.245 m  AORTA Ao Root diam: 3.70 cm MITRAL VALVE MV Area (PHT): 2.18 cm    SHUNTS MV Decel Time: 348 msec    Systemic VTI:  0.24 m MV E velocity: 71.10 cm/s  Systemic Diam: 2.10 cm MV A velocity: 78.00 cm/s MV E/A ratio:  0.91 Mertie Moores MD Electronically signed by Mertie Moores MD Signature Date/Time: 01/29/2021/9:32:13 AM    Final      Labs:   Basic Metabolic Panel: Recent Labs  Lab 01/28/21 1444 01/29/21 0030 01/30/21 0304 01/31/21 0505  NA 138 137 136 136  K 4.6  4.5 4.3 4.4  CL 108 108 105 103  CO2 21* 23 22 27   GLUCOSE 123* 99 81 84  BUN 37* 37* 35* 27*  CREATININE 1.98* 1.70* 1.58* 1.35*  CALCIUM 9.6 9.7 10.2 10.3  MG  --   --  1.8 1.9  PHOS  --   --  2.7 2.7   GFR Estimated Creatinine Clearance: 53 mL/min (A) (by C-G formula based on SCr of 1.35 mg/dL (H)). Liver Function Tests: Recent Labs  Lab 01/29/21 0030 01/30/21 0304 01/31/21 0505  AST 11*  --   --   ALT <5  --   --   ALKPHOS 57  --   --   BILITOT 0.5  --   --   PROT 6.2*  --   --   ALBUMIN 3.4* 3.7 3.5   No results for input(s): LIPASE, AMYLASE in the last 168 hours. No results for input(s): AMMONIA in the last 168 hours. Coagulation profile No results for input(s): INR, PROTIME in the last 168 hours.  CBC: Recent Labs  Lab 01/28/21 1444 01/29/21 0030 01/30/21 0304 01/31/21 0505  WBC 4.6 5.2 6.0 5.2  HGB 10.5* 10.4* 11.1* 10.6*  HCT 34.2* 33.1* 35.1* 33.9*  MCV 93.4 91.2 90.5 91.4  PLT 158 139* 135* 153   Cardiac Enzymes: Recent Labs  Lab 01/30/21 0304  CKTOTAL 35*   BNP: Invalid input(s): POCBNP CBG: Recent Labs  Lab 01/29/21 1152 01/29/21 1606 01/30/21 0532 01/30/21 7096  01/31/21 0711  GLUCAP 99 113* 86 93 98   D-Dimer No results for input(s): DDIMER in the last 72 hours. Hgb A1c No results for input(s): HGBA1C in the last 72 hours. Lipid Profile No results for input(s): CHOL, HDL, LDLCALC, TRIG, CHOLHDL, LDLDIRECT in the last 72 hours. Thyroid function studies Recent Labs    01/31/21 0505  TSH 1.157   Anemia work up No results for input(s): VITAMINB12, FOLATE, FERRITIN, TIBC, IRON, RETICCTPCT in the last 72 hours. Microbiology Recent Results (from the past 240 hour(s))  Resp Panel by RT-PCR (Flu A&B, Covid) Nasopharyngeal Swab     Status: None   Collection Time: 01/28/21  4:49 PM   Specimen: Nasopharyngeal Swab; Nasopharyngeal(NP) swabs in vial transport medium  Result Value Ref Range Status   SARS Coronavirus 2 by RT PCR NEGATIVE  NEGATIVE Final    Comment: (NOTE) SARS-CoV-2 target nucleic acids are NOT DETECTED.  The SARS-CoV-2 RNA is generally detectable in upper respiratory specimens during the acute phase of infection. The lowest concentration of SARS-CoV-2 viral copies this assay can detect is 138 copies/mL. A negative result does not preclude SARS-Cov-2 infection and should not be used as the sole basis for treatment or other patient management decisions. A negative result may occur with  improper specimen collection/handling, submission of specimen other than nasopharyngeal swab, presence of viral mutation(s) within the areas targeted by this assay, and inadequate number of viral copies(<138 copies/mL). A negative result must be combined with clinical observations, patient history, and epidemiological information. The expected result is Negative.  Fact Sheet for Patients:  EntrepreneurPulse.com.au  Fact Sheet for Healthcare Providers:  IncredibleEmployment.be  This test is no t yet approved or cleared by the [place] FDA and  has been authorized for detection and/or diagnosis of SARS-CoV-2 by FDA under an Emergency Use Authorization (EUA). This EUA will remain  in effect (meaning this test can be used) for the duration of the COVID-19 declaration under Section 564(b)(1) of the Act, 21 U.S.C.section 360bbb-3(b)(1), unless the authorization is terminated  or revoked sooner.       Influenza A by PCR NEGATIVE NEGATIVE Final   Influenza B by PCR NEGATIVE NEGATIVE Final    Comment: (NOTE) The Xpert Xpress SARS-CoV-2/FLU/RSV plus assay is intended as an aid in the diagnosis of influenza from Nasopharyngeal swab specimens and should not be used as a sole basis for treatment. Nasal washings and aspirates are unacceptable for Xpert Xpress SARS-CoV-2/FLU/RSV testing.  Fact Sheet for Patients: EntrepreneurPulse.com.au  Fact Sheet for Healthcare  Providers: IncredibleEmployment.be  This test is not yet approved or cleared by the [place] FDA and has been authorized for detection and/or diagnosis of SARS-CoV-2 by FDA under an Emergency Use Authorization (EUA). This EUA will remain in effect (meaning this test can be used) for the duration of the COVID-19 declaration under Section 564(b)(1) of the Act, 21 U.S.C. section 360bbb-3(b)(1), unless the authorization is terminated or revoked.  Performed at [facility], [place] [address], [place], [place] [address]      Discharge Instructions:   Discharge Instructions     Diet general   Complete by: As directed    Discharge instructions   Complete by: As directed    Home health Stay hydrated Orthostatic hypotension:  TED hose and abdominal binder Other simple adjustments may have great benefit: Raise the head of the bed at night or use more pillows. Drink a full, cold glass of water prior to standing up. Change positions cautiously  and slowly. Avoid prolonged standing, or shift positions/cross your legs if you do. Engage in regular physical exercise.   Increase activity slowly   Complete by: As directed       Allergies as of 01/31/2021   No Known Allergies      Medication List     TAKE these medications    apixaban 5 MG Tabs tablet Commonly known as: Eliquis Take 1 tablet (5 mg total) by mouth 2 (two) times daily.   carbidopa-levodopa 25-100 MG tablet Commonly known as: SINEMET IR Take 1 tablet by mouth 3 (three) times daily.   fenofibrate 54 MG tablet Take 54 mg by mouth daily.   risperiDONE 0.5 MG tablet Commonly known as: RisperDAL Take 1 tablet (0.5 mg total) by mouth at bedtime.   sertraline 100 MG tablet Commonly known as: ZOLOFT Take 1.5 tablets (150 mg total) by mouth daily.   simvastatin 10 MG tablet Commonly known as: ZOCOR Take 1 tablet (10 mg total) by mouth daily at 6 PM. What changed: when to take this    vitamin B-12 1000 MCG tablet Commonly known as: CYANOCOBALAMIN Take 1 tablet (1,000 mcg total) by mouth daily.        Follow-up Information     [name], MD Follow up in 1 week(s).   Specialty: Internal Medicine Contact information: [address] [place] [address] [phone]         Minus Breeding, MD .   Specialty: Cardiology Contact information: [address] [place] [address] [phone]                  Time coordinating discharge: 35 min  Signed:  [name] DO  [facility] 01/31/2021, 11:32 AM

## 2021-02-02 DIAGNOSIS — R499 Unspecified voice and resonance disorder: Secondary | ICD-10-CM | POA: Diagnosis not present

## 2021-02-02 DIAGNOSIS — R2681 Unsteadiness on feet: Secondary | ICD-10-CM | POA: Diagnosis not present

## 2021-02-02 DIAGNOSIS — R296 Repeated falls: Secondary | ICD-10-CM | POA: Diagnosis not present

## 2021-02-02 DIAGNOSIS — R488 Other symbolic dysfunctions: Secondary | ICD-10-CM | POA: Diagnosis not present

## 2021-02-02 DIAGNOSIS — R41841 Cognitive communication deficit: Secondary | ICD-10-CM | POA: Diagnosis not present

## 2021-02-02 DIAGNOSIS — R2689 Other abnormalities of gait and mobility: Secondary | ICD-10-CM | POA: Diagnosis not present

## 2021-02-02 DIAGNOSIS — M6281 Muscle weakness (generalized): Secondary | ICD-10-CM | POA: Diagnosis not present

## 2021-02-03 DIAGNOSIS — R488 Other symbolic dysfunctions: Secondary | ICD-10-CM | POA: Diagnosis not present

## 2021-02-03 DIAGNOSIS — M6281 Muscle weakness (generalized): Secondary | ICD-10-CM | POA: Diagnosis not present

## 2021-02-03 DIAGNOSIS — R499 Unspecified voice and resonance disorder: Secondary | ICD-10-CM | POA: Diagnosis not present

## 2021-02-03 DIAGNOSIS — R41841 Cognitive communication deficit: Secondary | ICD-10-CM | POA: Diagnosis not present

## 2021-02-04 DIAGNOSIS — Z20828 Contact with and (suspected) exposure to other viral communicable diseases: Secondary | ICD-10-CM | POA: Diagnosis not present

## 2021-02-04 DIAGNOSIS — R2681 Unsteadiness on feet: Secondary | ICD-10-CM | POA: Diagnosis not present

## 2021-02-04 DIAGNOSIS — R499 Unspecified voice and resonance disorder: Secondary | ICD-10-CM | POA: Diagnosis not present

## 2021-02-04 DIAGNOSIS — R488 Other symbolic dysfunctions: Secondary | ICD-10-CM | POA: Diagnosis not present

## 2021-02-04 DIAGNOSIS — R41841 Cognitive communication deficit: Secondary | ICD-10-CM | POA: Diagnosis not present

## 2021-02-04 DIAGNOSIS — M6281 Muscle weakness (generalized): Secondary | ICD-10-CM | POA: Diagnosis not present

## 2021-02-04 DIAGNOSIS — R296 Repeated falls: Secondary | ICD-10-CM | POA: Diagnosis not present

## 2021-02-04 DIAGNOSIS — R2689 Other abnormalities of gait and mobility: Secondary | ICD-10-CM | POA: Diagnosis not present

## 2021-02-05 DIAGNOSIS — R41841 Cognitive communication deficit: Secondary | ICD-10-CM | POA: Diagnosis not present

## 2021-02-05 DIAGNOSIS — R499 Unspecified voice and resonance disorder: Secondary | ICD-10-CM | POA: Diagnosis not present

## 2021-02-05 DIAGNOSIS — R488 Other symbolic dysfunctions: Secondary | ICD-10-CM | POA: Diagnosis not present

## 2021-02-06 DIAGNOSIS — R2681 Unsteadiness on feet: Secondary | ICD-10-CM | POA: Diagnosis not present

## 2021-02-06 DIAGNOSIS — R2689 Other abnormalities of gait and mobility: Secondary | ICD-10-CM | POA: Diagnosis not present

## 2021-02-06 DIAGNOSIS — M6281 Muscle weakness (generalized): Secondary | ICD-10-CM | POA: Diagnosis not present

## 2021-02-06 DIAGNOSIS — R296 Repeated falls: Secondary | ICD-10-CM | POA: Diagnosis not present

## 2021-02-08 DIAGNOSIS — M6281 Muscle weakness (generalized): Secondary | ICD-10-CM | POA: Diagnosis not present

## 2021-02-08 DIAGNOSIS — R499 Unspecified voice and resonance disorder: Secondary | ICD-10-CM | POA: Diagnosis not present

## 2021-02-08 DIAGNOSIS — Z20828 Contact with and (suspected) exposure to other viral communicable diseases: Secondary | ICD-10-CM | POA: Diagnosis not present

## 2021-02-08 DIAGNOSIS — R41841 Cognitive communication deficit: Secondary | ICD-10-CM | POA: Diagnosis not present

## 2021-02-08 DIAGNOSIS — R488 Other symbolic dysfunctions: Secondary | ICD-10-CM | POA: Diagnosis not present

## 2021-02-09 DIAGNOSIS — N1831 Chronic kidney disease, stage 3a: Secondary | ICD-10-CM | POA: Diagnosis not present

## 2021-02-09 DIAGNOSIS — E782 Mixed hyperlipidemia: Secondary | ICD-10-CM | POA: Diagnosis not present

## 2021-02-09 DIAGNOSIS — F3170 Bipolar disorder, currently in remission, most recent episode unspecified: Secondary | ICD-10-CM | POA: Diagnosis not present

## 2021-02-09 DIAGNOSIS — F331 Major depressive disorder, recurrent, moderate: Secondary | ICD-10-CM | POA: Diagnosis not present

## 2021-02-09 DIAGNOSIS — R41841 Cognitive communication deficit: Secondary | ICD-10-CM | POA: Diagnosis not present

## 2021-02-09 DIAGNOSIS — G20 Parkinson's disease: Secondary | ICD-10-CM | POA: Diagnosis not present

## 2021-02-09 DIAGNOSIS — M6281 Muscle weakness (generalized): Secondary | ICD-10-CM | POA: Diagnosis not present

## 2021-02-09 DIAGNOSIS — R2689 Other abnormalities of gait and mobility: Secondary | ICD-10-CM | POA: Diagnosis not present

## 2021-02-09 DIAGNOSIS — R2681 Unsteadiness on feet: Secondary | ICD-10-CM | POA: Diagnosis not present

## 2021-02-09 DIAGNOSIS — R499 Unspecified voice and resonance disorder: Secondary | ICD-10-CM | POA: Diagnosis not present

## 2021-02-09 DIAGNOSIS — R296 Repeated falls: Secondary | ICD-10-CM | POA: Diagnosis not present

## 2021-02-09 DIAGNOSIS — I480 Paroxysmal atrial fibrillation: Secondary | ICD-10-CM | POA: Diagnosis not present

## 2021-02-09 DIAGNOSIS — R488 Other symbolic dysfunctions: Secondary | ICD-10-CM | POA: Diagnosis not present

## 2021-02-10 DIAGNOSIS — R2681 Unsteadiness on feet: Secondary | ICD-10-CM | POA: Diagnosis not present

## 2021-02-10 DIAGNOSIS — R296 Repeated falls: Secondary | ICD-10-CM | POA: Diagnosis not present

## 2021-02-10 DIAGNOSIS — R2689 Other abnormalities of gait and mobility: Secondary | ICD-10-CM | POA: Diagnosis not present

## 2021-02-10 DIAGNOSIS — M6281 Muscle weakness (generalized): Secondary | ICD-10-CM | POA: Diagnosis not present

## 2021-02-11 DIAGNOSIS — R296 Repeated falls: Secondary | ICD-10-CM | POA: Diagnosis not present

## 2021-02-11 DIAGNOSIS — R499 Unspecified voice and resonance disorder: Secondary | ICD-10-CM | POA: Diagnosis not present

## 2021-02-11 DIAGNOSIS — R2689 Other abnormalities of gait and mobility: Secondary | ICD-10-CM | POA: Diagnosis not present

## 2021-02-11 DIAGNOSIS — R488 Other symbolic dysfunctions: Secondary | ICD-10-CM | POA: Diagnosis not present

## 2021-02-11 DIAGNOSIS — R41841 Cognitive communication deficit: Secondary | ICD-10-CM | POA: Diagnosis not present

## 2021-02-11 DIAGNOSIS — M6281 Muscle weakness (generalized): Secondary | ICD-10-CM | POA: Diagnosis not present

## 2021-02-11 DIAGNOSIS — R2681 Unsteadiness on feet: Secondary | ICD-10-CM | POA: Diagnosis not present

## 2021-02-15 DIAGNOSIS — R2689 Other abnormalities of gait and mobility: Secondary | ICD-10-CM | POA: Diagnosis not present

## 2021-02-15 DIAGNOSIS — N179 Acute kidney failure, unspecified: Secondary | ICD-10-CM | POA: Diagnosis not present

## 2021-02-15 DIAGNOSIS — M6281 Muscle weakness (generalized): Secondary | ICD-10-CM | POA: Diagnosis not present

## 2021-02-15 DIAGNOSIS — R2681 Unsteadiness on feet: Secondary | ICD-10-CM | POA: Diagnosis not present

## 2021-02-15 DIAGNOSIS — R55 Syncope and collapse: Secondary | ICD-10-CM | POA: Diagnosis not present

## 2021-02-15 DIAGNOSIS — I480 Paroxysmal atrial fibrillation: Secondary | ICD-10-CM | POA: Diagnosis not present

## 2021-02-15 DIAGNOSIS — R296 Repeated falls: Secondary | ICD-10-CM | POA: Diagnosis not present

## 2021-02-15 DIAGNOSIS — R269 Unspecified abnormalities of gait and mobility: Secondary | ICD-10-CM | POA: Diagnosis not present

## 2021-02-16 DIAGNOSIS — R41841 Cognitive communication deficit: Secondary | ICD-10-CM | POA: Diagnosis not present

## 2021-02-16 DIAGNOSIS — R2681 Unsteadiness on feet: Secondary | ICD-10-CM | POA: Diagnosis not present

## 2021-02-16 DIAGNOSIS — M6281 Muscle weakness (generalized): Secondary | ICD-10-CM | POA: Diagnosis not present

## 2021-02-16 DIAGNOSIS — R2689 Other abnormalities of gait and mobility: Secondary | ICD-10-CM | POA: Diagnosis not present

## 2021-02-16 DIAGNOSIS — R488 Other symbolic dysfunctions: Secondary | ICD-10-CM | POA: Diagnosis not present

## 2021-02-16 DIAGNOSIS — R296 Repeated falls: Secondary | ICD-10-CM | POA: Diagnosis not present

## 2021-02-16 DIAGNOSIS — R499 Unspecified voice and resonance disorder: Secondary | ICD-10-CM | POA: Diagnosis not present

## 2021-02-17 DIAGNOSIS — M6281 Muscle weakness (generalized): Secondary | ICD-10-CM | POA: Diagnosis not present

## 2021-02-17 DIAGNOSIS — R41841 Cognitive communication deficit: Secondary | ICD-10-CM | POA: Diagnosis not present

## 2021-02-17 DIAGNOSIS — R499 Unspecified voice and resonance disorder: Secondary | ICD-10-CM | POA: Diagnosis not present

## 2021-02-17 DIAGNOSIS — R488 Other symbolic dysfunctions: Secondary | ICD-10-CM | POA: Diagnosis not present

## 2021-02-18 DIAGNOSIS — Z20828 Contact with and (suspected) exposure to other viral communicable diseases: Secondary | ICD-10-CM | POA: Diagnosis not present

## 2021-02-19 DIAGNOSIS — R2689 Other abnormalities of gait and mobility: Secondary | ICD-10-CM | POA: Diagnosis not present

## 2021-02-19 DIAGNOSIS — R41841 Cognitive communication deficit: Secondary | ICD-10-CM | POA: Diagnosis not present

## 2021-02-19 DIAGNOSIS — R2681 Unsteadiness on feet: Secondary | ICD-10-CM | POA: Diagnosis not present

## 2021-02-19 DIAGNOSIS — R488 Other symbolic dysfunctions: Secondary | ICD-10-CM | POA: Diagnosis not present

## 2021-02-19 DIAGNOSIS — M6281 Muscle weakness (generalized): Secondary | ICD-10-CM | POA: Diagnosis not present

## 2021-02-19 DIAGNOSIS — R499 Unspecified voice and resonance disorder: Secondary | ICD-10-CM | POA: Diagnosis not present

## 2021-02-19 DIAGNOSIS — R296 Repeated falls: Secondary | ICD-10-CM | POA: Diagnosis not present

## 2021-02-20 DIAGNOSIS — R499 Unspecified voice and resonance disorder: Secondary | ICD-10-CM | POA: Diagnosis not present

## 2021-02-20 DIAGNOSIS — R488 Other symbolic dysfunctions: Secondary | ICD-10-CM | POA: Diagnosis not present

## 2021-02-20 DIAGNOSIS — R41841 Cognitive communication deficit: Secondary | ICD-10-CM | POA: Diagnosis not present

## 2021-02-22 DIAGNOSIS — M6281 Muscle weakness (generalized): Secondary | ICD-10-CM | POA: Diagnosis not present

## 2021-02-22 DIAGNOSIS — R296 Repeated falls: Secondary | ICD-10-CM | POA: Diagnosis not present

## 2021-02-22 DIAGNOSIS — R2681 Unsteadiness on feet: Secondary | ICD-10-CM | POA: Diagnosis not present

## 2021-02-22 DIAGNOSIS — R2689 Other abnormalities of gait and mobility: Secondary | ICD-10-CM | POA: Diagnosis not present

## 2021-02-23 ENCOUNTER — Emergency Department (HOSPITAL_COMMUNITY): Payer: Medicare HMO

## 2021-02-23 ENCOUNTER — Encounter (HOSPITAL_COMMUNITY): Payer: Self-pay

## 2021-02-23 ENCOUNTER — Emergency Department (HOSPITAL_COMMUNITY)
Admission: EM | Admit: 2021-02-23 | Discharge: 2021-02-24 | Disposition: A | Discharge status: Home / Self Care | Payer: Medicare HMO | Attending: Emergency Medicine | Admitting: Emergency Medicine

## 2021-02-23 ENCOUNTER — Other Ambulatory Visit: Payer: Self-pay

## 2021-02-23 DIAGNOSIS — F17210 Nicotine dependence, cigarettes, uncomplicated: Secondary | ICD-10-CM | POA: Insufficient documentation

## 2021-02-23 DIAGNOSIS — Z743 Need for continuous supervision: Secondary | ICD-10-CM | POA: Diagnosis not present

## 2021-02-23 DIAGNOSIS — Z8616 Personal history of COVID-19: Secondary | ICD-10-CM | POA: Diagnosis not present

## 2021-02-23 DIAGNOSIS — K429 Umbilical hernia without obstruction or gangrene: Secondary | ICD-10-CM | POA: Diagnosis not present

## 2021-02-23 DIAGNOSIS — I129 Hypertensive chronic kidney disease with stage 1 through stage 4 chronic kidney disease, or unspecified chronic kidney disease: Secondary | ICD-10-CM | POA: Diagnosis not present

## 2021-02-23 DIAGNOSIS — R488 Other symbolic dysfunctions: Secondary | ICD-10-CM | POA: Diagnosis not present

## 2021-02-23 DIAGNOSIS — R55 Syncope and collapse: Secondary | ICD-10-CM | POA: Diagnosis not present

## 2021-02-23 DIAGNOSIS — G20 Parkinson's disease: Secondary | ICD-10-CM | POA: Insufficient documentation

## 2021-02-23 DIAGNOSIS — I499 Cardiac arrhythmia, unspecified: Secondary | ICD-10-CM | POA: Diagnosis not present

## 2021-02-23 DIAGNOSIS — R0902 Hypoxemia: Secondary | ICD-10-CM | POA: Diagnosis not present

## 2021-02-23 DIAGNOSIS — R2689 Other abnormalities of gait and mobility: Secondary | ICD-10-CM | POA: Diagnosis not present

## 2021-02-23 DIAGNOSIS — R499 Unspecified voice and resonance disorder: Secondary | ICD-10-CM | POA: Diagnosis not present

## 2021-02-23 DIAGNOSIS — R296 Repeated falls: Secondary | ICD-10-CM | POA: Diagnosis not present

## 2021-02-23 DIAGNOSIS — J439 Emphysema, unspecified: Secondary | ICD-10-CM | POA: Diagnosis not present

## 2021-02-23 DIAGNOSIS — R41841 Cognitive communication deficit: Secondary | ICD-10-CM | POA: Diagnosis not present

## 2021-02-23 DIAGNOSIS — M6281 Muscle weakness (generalized): Secondary | ICD-10-CM | POA: Diagnosis not present

## 2021-02-23 DIAGNOSIS — Z7982 Long term (current) use of aspirin: Secondary | ICD-10-CM | POA: Insufficient documentation

## 2021-02-23 DIAGNOSIS — R2681 Unsteadiness on feet: Secondary | ICD-10-CM | POA: Diagnosis not present

## 2021-02-23 DIAGNOSIS — I2510 Atherosclerotic heart disease of native coronary artery without angina pectoris: Secondary | ICD-10-CM | POA: Diagnosis not present

## 2021-02-23 DIAGNOSIS — I959 Hypotension, unspecified: Secondary | ICD-10-CM | POA: Diagnosis not present

## 2021-02-23 DIAGNOSIS — R4020 Unspecified coma: Secondary | ICD-10-CM | POA: Diagnosis not present

## 2021-02-23 DIAGNOSIS — K8020 Calculus of gallbladder without cholecystitis without obstruction: Secondary | ICD-10-CM | POA: Diagnosis not present

## 2021-02-23 DIAGNOSIS — Z79899 Other long term (current) drug therapy: Secondary | ICD-10-CM | POA: Diagnosis not present

## 2021-02-23 DIAGNOSIS — R109 Unspecified abdominal pain: Secondary | ICD-10-CM | POA: Diagnosis not present

## 2021-02-23 DIAGNOSIS — N183 Chronic kidney disease, stage 3 unspecified: Secondary | ICD-10-CM | POA: Insufficient documentation

## 2021-02-23 DIAGNOSIS — I712 Thoracic aortic aneurysm, without rupture: Secondary | ICD-10-CM | POA: Diagnosis not present

## 2021-02-23 DIAGNOSIS — I951 Orthostatic hypotension: Secondary | ICD-10-CM

## 2021-02-23 DIAGNOSIS — Z96641 Presence of right artificial hip joint: Secondary | ICD-10-CM | POA: Diagnosis not present

## 2021-02-23 DIAGNOSIS — I723 Aneurysm of iliac artery: Secondary | ICD-10-CM | POA: Diagnosis not present

## 2021-02-23 LAB — COMPREHENSIVE METABOLIC PANEL
ALT: <5 U/L (ref 0–44)
AST: 13 U/L — ABNORMAL LOW (ref 15–41)
Albumin: 4.2 g/dL (ref 3.5–5.0)
Alkaline Phosphatase: 72 U/L (ref 38–126)
Anion gap: 9 (ref 5–15)
BUN: 33 mg/dL — ABNORMAL HIGH (ref 8–23)
CO2: 24 mmol/L (ref 22–32)
Calcium: 10.4 mg/dL — ABNORMAL HIGH (ref 8.9–10.3)
Chloride: 101 mmol/L (ref 98–111)
Creatinine, Ser: 1.78 mg/dL — ABNORMAL HIGH (ref 0.61–1.24)
GFR, Estimated: 38 mL/min — ABNORMAL LOW (ref >60)
Glucose, Bld: 95 mg/dL (ref 70–99)
Potassium: 4.7 mmol/L (ref 3.5–5.1)
Sodium: 134 mmol/L — ABNORMAL LOW (ref 135–145)
Total Bilirubin: <0.1 mg/dL — ABNORMAL LOW (ref 0.3–1.2)
Total Protein: 7.3 g/dL (ref 6.5–8.1)

## 2021-02-23 LAB — URINALYSIS, ROUTINE W REFLEX MICROSCOPIC
Bilirubin Urine: NEGATIVE
Glucose, UA: 50 mg/dL — AB
Hgb urine dipstick: NEGATIVE
Ketones, ur: NEGATIVE mg/dL
Leukocytes,Ua: NEGATIVE
Nitrite: NEGATIVE
Protein, ur: NEGATIVE mg/dL
Specific Gravity, Urine: 1.018 (ref 1.005–1.030)
pH: 6 (ref 5.0–8.0)

## 2021-02-23 LAB — CBC WITH DIFFERENTIAL/PLATELET
Abs Immature Granulocytes: 0.02 10*3/uL (ref 0.00–0.07)
Basophils Absolute: 0 10*3/uL (ref 0.0–0.1)
Basophils Relative: 1 %
Eosinophils Absolute: 0.1 10*3/uL (ref 0.0–0.5)
Eosinophils Relative: 1 %
HCT: 39.5 % (ref 39.0–52.0)
Hemoglobin: 12.2 g/dL — ABNORMAL LOW (ref 13.0–17.0)
Immature Granulocytes: 0 %
Lymphocytes Relative: 15 %
Lymphs Abs: 0.9 10*3/uL (ref 0.7–4.0)
MCH: 28.7 pg (ref 26.0–34.0)
MCHC: 30.9 g/dL (ref 30.0–36.0)
MCV: 92.9 fL (ref 80.0–100.0)
Monocytes Absolute: 0.5 10*3/uL (ref 0.1–1.0)
Monocytes Relative: 9 %
Neutro Abs: 4.4 10*3/uL (ref 1.7–7.7)
Neutrophils Relative %: 74 %
Platelets: 173 10*3/uL (ref 150–400)
RBC: 4.25 MIL/uL (ref 4.22–5.81)
RDW: 13.1 % (ref 11.5–15.5)
WBC: 5.8 10*3/uL (ref 4.0–10.5)
nRBC: 0 % (ref 0.0–0.2)

## 2021-02-23 LAB — TROPONIN I (HIGH SENSITIVITY)
Troponin I (High Sensitivity): 11 ng/L (ref <18)
Troponin I (High Sensitivity): 12 ng/L (ref <18)

## 2021-02-23 LAB — LACTIC ACID, PLASMA
Lactic Acid, Venous: 1 mmol/L (ref 0.5–1.9)
Lactic Acid, Venous: 0.7 mmol/L (ref 0.5–1.9)

## 2021-02-23 MED ORDER — IOHEXOL 350 MG/ML SOLN
80.0000 mL | Freq: Once | INTRAVENOUS | Status: AC | PRN
  Administered 2021-02-23: 80 mL via INTRAVENOUS

## 2021-02-23 MED ORDER — SODIUM CHLORIDE 0.9 % IV BOLUS
1000.0000 mL | Freq: Once | INTRAVENOUS | Status: AC
  Administered 2021-02-23: 1000 mL via INTRAVENOUS
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
## 2021-02-23 NOTE — ED Notes (Addendum)
The patient is waiting in triage

## 2021-02-23 NOTE — ED Triage Notes (Signed)
Pt BIB EMS from [facility].Per EMS pt has 3 syncopal episodes. Pt per EMS when they arrived pt had weak ,thready radial pulses. Pt bp was 50 . Per facility pt bp has been trending in the lower end.Pt is a&Ox4 on arrival.

## 2021-02-23 NOTE — ED Provider Notes (Signed)
Emergency Medicine Provider Triage Evaluation Note  Epic Fernando Carr , a 80 y.o. male  was evaluated in triage.  Pt complains of 3 syncopal episodes happening today.  He arrives by EMS.  On EMS arrival he was hypotensive with a systolic pressure of 50 with thready radial pulses.  Patient lives in assisted living and per the facility he has had low blood pressures recently, they have been trending them.  Patient denies being in any pain currently.  EMS states "patient  had temp of 99.5 and smelled of possible urinary tract infection."  Patient denies any urinary symptoms.  Review of Systems  Positive: Syncope Negative: Chest pain, shortness of breath, abdominal pain, back pain, headache, visual changes  Physical Exam  BP 92/69   Pulse (!) 58   Temp 97.9 F (36.6 C) (Oral)   Resp 16   SpO2 99%  Gen:   Awake, no distress   Resp:  Normal effort  MSK:   Moves extremities without difficulty  Other:  Alert and oriented.  No focal weakness.  Strong and equal grip strength in all extremities.  No facial droop.  Speech is clear  Medical Decision Making  Medically screening exam initiated at 1:54 PM.  Appropriate orders placed.  Alfredo Batty Po was informed that the remainder of the evaluation will be completed by another provider, this initial triage assessment does not replace that evaluation, and the importance of remaining in the ED until their evaluation is complete.  Labs ordered including lactic acid with concern for possible sepsis given his hypotension and subjective fever prior to arrival.  Blood pressure is soft in triage, 92/69.  Chest x-ray and head CT ordered as well.   Portions of this note were generated with Lobbyist. Dictation errors may occur despite best attempts at proofreading.    Barrie Folk, PA-C 02/23/21 1356    Lajean Saver, MD 02/25/21 1317

## 2021-02-23 NOTE — ED Notes (Signed)
MD notified of patient blood pressure. Will continue to monitor.

## 2021-02-23 NOTE — ED Notes (Signed)
Patient denies dizziness or any issues when he changes positions.

## 2021-02-24 DIAGNOSIS — R2681 Unsteadiness on feet: Secondary | ICD-10-CM | POA: Diagnosis not present

## 2021-02-24 DIAGNOSIS — R296 Repeated falls: Secondary | ICD-10-CM | POA: Diagnosis not present

## 2021-02-24 DIAGNOSIS — Z7401 Bed confinement status: Secondary | ICD-10-CM | POA: Diagnosis not present

## 2021-02-24 DIAGNOSIS — R0902 Hypoxemia: Secondary | ICD-10-CM | POA: Diagnosis not present

## 2021-02-24 DIAGNOSIS — I10 Essential (primary) hypertension: Secondary | ICD-10-CM | POA: Diagnosis not present

## 2021-02-24 DIAGNOSIS — M6281 Muscle weakness (generalized): Secondary | ICD-10-CM | POA: Diagnosis not present

## 2021-02-24 DIAGNOSIS — R2689 Other abnormalities of gait and mobility: Secondary | ICD-10-CM | POA: Diagnosis not present

## 2021-02-24 NOTE — ED Notes (Signed)
AVS, facesheet, and medical necessity provided to [facility] upon transport and DC.

## 2021-02-24 NOTE — ED Notes (Signed)
Ptar Called. 

## 2021-02-24 NOTE — Discharge Instructions (Addendum)
You have been seen and discharged from the emergency department.  You were found to have orthostatic hypotension, improved with IV fluids.  This may be secondary to your Parkinson medicine carbidopa levodopa, have this medication reevaluated by your PCP.  Stay well-hydrated.  Also of note on your CT study there were vascular abnormalities that appear chronic.  Dr. Doren Custard evaluated your CT scan and has arranged follow-up outpatient imaging for further evaluation.  Take home medications as prescribed. If you have any worsening symptoms or further concerns for your health please return to an emergency department for further evaluation.

## 2021-02-24 NOTE — ED Notes (Signed)
PTAR notified by Network engineer for transport.

## 2021-02-25 DIAGNOSIS — R488 Other symbolic dysfunctions: Secondary | ICD-10-CM | POA: Diagnosis not present

## 2021-02-25 DIAGNOSIS — R41841 Cognitive communication deficit: Secondary | ICD-10-CM | POA: Diagnosis not present

## 2021-02-25 DIAGNOSIS — R296 Repeated falls: Secondary | ICD-10-CM | POA: Diagnosis not present

## 2021-02-25 DIAGNOSIS — R2681 Unsteadiness on feet: Secondary | ICD-10-CM | POA: Diagnosis not present

## 2021-02-25 DIAGNOSIS — M6281 Muscle weakness (generalized): Secondary | ICD-10-CM | POA: Diagnosis not present

## 2021-02-25 DIAGNOSIS — R2689 Other abnormalities of gait and mobility: Secondary | ICD-10-CM | POA: Diagnosis not present

## 2021-02-25 DIAGNOSIS — R499 Unspecified voice and resonance disorder: Secondary | ICD-10-CM | POA: Diagnosis not present

## 2021-02-26 DIAGNOSIS — M6281 Muscle weakness (generalized): Secondary | ICD-10-CM | POA: Diagnosis not present

## 2021-02-28 DIAGNOSIS — M6281 Muscle weakness (generalized): Secondary | ICD-10-CM | POA: Diagnosis not present

## 2021-03-01 ENCOUNTER — Other Ambulatory Visit (HOSPITAL_COMMUNITY): Payer: Self-pay | Admitting: Psychiatry

## 2021-03-01 DIAGNOSIS — R296 Repeated falls: Secondary | ICD-10-CM | POA: Diagnosis not present

## 2021-03-01 DIAGNOSIS — I480 Paroxysmal atrial fibrillation: Secondary | ICD-10-CM | POA: Diagnosis not present

## 2021-03-01 DIAGNOSIS — R2689 Other abnormalities of gait and mobility: Secondary | ICD-10-CM | POA: Diagnosis not present

## 2021-03-01 DIAGNOSIS — Z7189 Other specified counseling: Secondary | ICD-10-CM | POA: Diagnosis not present

## 2021-03-01 DIAGNOSIS — R55 Syncope and collapse: Secondary | ICD-10-CM | POA: Diagnosis not present

## 2021-03-01 DIAGNOSIS — M6281 Muscle weakness (generalized): Secondary | ICD-10-CM | POA: Diagnosis not present

## 2021-03-01 DIAGNOSIS — F319 Bipolar disorder, unspecified: Secondary | ICD-10-CM

## 2021-03-01 DIAGNOSIS — R2681 Unsteadiness on feet: Secondary | ICD-10-CM | POA: Diagnosis not present

## 2021-03-01 DIAGNOSIS — I951 Orthostatic hypotension: Secondary | ICD-10-CM | POA: Diagnosis not present

## 2021-03-02 DIAGNOSIS — R41841 Cognitive communication deficit: Secondary | ICD-10-CM | POA: Diagnosis not present

## 2021-03-02 DIAGNOSIS — R499 Unspecified voice and resonance disorder: Secondary | ICD-10-CM | POA: Diagnosis not present

## 2021-03-02 DIAGNOSIS — R488 Other symbolic dysfunctions: Secondary | ICD-10-CM | POA: Diagnosis not present

## 2021-03-03 DIAGNOSIS — M6281 Muscle weakness (generalized): Secondary | ICD-10-CM | POA: Diagnosis not present

## 2021-03-03 DIAGNOSIS — R488 Other symbolic dysfunctions: Secondary | ICD-10-CM | POA: Diagnosis not present

## 2021-03-03 DIAGNOSIS — R499 Unspecified voice and resonance disorder: Secondary | ICD-10-CM | POA: Diagnosis not present

## 2021-03-03 DIAGNOSIS — R41841 Cognitive communication deficit: Secondary | ICD-10-CM | POA: Diagnosis not present

## 2021-03-04 DIAGNOSIS — M6281 Muscle weakness (generalized): Secondary | ICD-10-CM | POA: Diagnosis not present

## 2021-03-05 DIAGNOSIS — R41841 Cognitive communication deficit: Secondary | ICD-10-CM | POA: Diagnosis not present

## 2021-03-05 DIAGNOSIS — R499 Unspecified voice and resonance disorder: Secondary | ICD-10-CM | POA: Diagnosis not present

## 2021-03-05 DIAGNOSIS — R488 Other symbolic dysfunctions: Secondary | ICD-10-CM | POA: Diagnosis not present

## 2021-03-08 DIAGNOSIS — R488 Other symbolic dysfunctions: Secondary | ICD-10-CM | POA: Diagnosis not present

## 2021-03-08 DIAGNOSIS — R2681 Unsteadiness on feet: Secondary | ICD-10-CM | POA: Diagnosis not present

## 2021-03-08 DIAGNOSIS — R2689 Other abnormalities of gait and mobility: Secondary | ICD-10-CM | POA: Diagnosis not present

## 2021-03-08 DIAGNOSIS — R41841 Cognitive communication deficit: Secondary | ICD-10-CM | POA: Diagnosis not present

## 2021-03-08 DIAGNOSIS — M6281 Muscle weakness (generalized): Secondary | ICD-10-CM | POA: Diagnosis not present

## 2021-03-08 DIAGNOSIS — R499 Unspecified voice and resonance disorder: Secondary | ICD-10-CM | POA: Diagnosis not present

## 2021-03-08 DIAGNOSIS — R296 Repeated falls: Secondary | ICD-10-CM | POA: Diagnosis not present

## 2021-03-09 DIAGNOSIS — M6281 Muscle weakness (generalized): Secondary | ICD-10-CM | POA: Diagnosis not present

## 2021-03-09 DIAGNOSIS — R488 Other symbolic dysfunctions: Secondary | ICD-10-CM | POA: Diagnosis not present

## 2021-03-09 DIAGNOSIS — R41841 Cognitive communication deficit: Secondary | ICD-10-CM | POA: Diagnosis not present

## 2021-03-09 DIAGNOSIS — R499 Unspecified voice and resonance disorder: Secondary | ICD-10-CM | POA: Diagnosis not present

## 2021-03-10 DIAGNOSIS — R2689 Other abnormalities of gait and mobility: Secondary | ICD-10-CM | POA: Diagnosis not present

## 2021-03-10 DIAGNOSIS — M6281 Muscle weakness (generalized): Secondary | ICD-10-CM | POA: Diagnosis not present

## 2021-03-10 DIAGNOSIS — R2681 Unsteadiness on feet: Secondary | ICD-10-CM | POA: Diagnosis not present

## 2021-03-10 DIAGNOSIS — R296 Repeated falls: Secondary | ICD-10-CM | POA: Diagnosis not present

## 2021-03-11 ENCOUNTER — Telehealth (INDEPENDENT_AMBULATORY_CARE_PROVIDER_SITE_OTHER): Payer: Medicare HMO | Admitting: Psychiatry

## 2021-03-11 ENCOUNTER — Other Ambulatory Visit: Payer: Self-pay

## 2021-03-11 ENCOUNTER — Encounter (HOSPITAL_COMMUNITY): Payer: Self-pay | Admitting: Psychiatry

## 2021-03-11 DIAGNOSIS — F319 Bipolar disorder, unspecified: Secondary | ICD-10-CM

## 2021-03-11 DIAGNOSIS — F419 Anxiety disorder, unspecified: Secondary | ICD-10-CM

## 2021-03-11 DIAGNOSIS — M6281 Muscle weakness (generalized): Secondary | ICD-10-CM | POA: Diagnosis not present

## 2021-03-11 DIAGNOSIS — Z20828 Contact with and (suspected) exposure to other viral communicable diseases: Secondary | ICD-10-CM | POA: Diagnosis not present

## 2021-03-11 MED ORDER — SERTRALINE HCL 100 MG PO TABS: 100.0000 mg | ORAL_TABLET | Freq: Every day | ORAL | 0 refills | Status: DC

## 2021-03-11 MED ORDER — RISPERIDONE 0.5 MG PO TABS: 0.5000 mg | ORAL_TABLET | Freq: Every day | ORAL | 0 refills | Status: DC
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
## 2021-03-12 DIAGNOSIS — R488 Other symbolic dysfunctions: Secondary | ICD-10-CM | POA: Diagnosis not present

## 2021-03-12 DIAGNOSIS — R499 Unspecified voice and resonance disorder: Secondary | ICD-10-CM | POA: Diagnosis not present

## 2021-03-12 DIAGNOSIS — R41841 Cognitive communication deficit: Secondary | ICD-10-CM | POA: Diagnosis not present

## 2021-03-15 DIAGNOSIS — M6281 Muscle weakness (generalized): Secondary | ICD-10-CM | POA: Diagnosis not present

## 2021-03-15 DIAGNOSIS — R2689 Other abnormalities of gait and mobility: Secondary | ICD-10-CM | POA: Diagnosis not present

## 2021-03-15 DIAGNOSIS — R2681 Unsteadiness on feet: Secondary | ICD-10-CM | POA: Diagnosis not present

## 2021-03-15 DIAGNOSIS — R499 Unspecified voice and resonance disorder: Secondary | ICD-10-CM | POA: Diagnosis not present

## 2021-03-15 DIAGNOSIS — R296 Repeated falls: Secondary | ICD-10-CM | POA: Diagnosis not present

## 2021-03-15 DIAGNOSIS — R488 Other symbolic dysfunctions: Secondary | ICD-10-CM | POA: Diagnosis not present

## 2021-03-15 DIAGNOSIS — R41841 Cognitive communication deficit: Secondary | ICD-10-CM | POA: Diagnosis not present

## 2021-03-16 DIAGNOSIS — R499 Unspecified voice and resonance disorder: Secondary | ICD-10-CM | POA: Diagnosis not present

## 2021-03-16 DIAGNOSIS — R41841 Cognitive communication deficit: Secondary | ICD-10-CM | POA: Diagnosis not present

## 2021-03-16 DIAGNOSIS — R488 Other symbolic dysfunctions: Secondary | ICD-10-CM | POA: Diagnosis not present

## 2021-03-16 DIAGNOSIS — M6281 Muscle weakness (generalized): Secondary | ICD-10-CM | POA: Diagnosis not present

## 2021-03-17 DIAGNOSIS — R269 Unspecified abnormalities of gait and mobility: Secondary | ICD-10-CM | POA: Diagnosis not present

## 2021-03-17 DIAGNOSIS — I480 Paroxysmal atrial fibrillation: Secondary | ICD-10-CM | POA: Diagnosis not present

## 2021-03-17 DIAGNOSIS — F331 Major depressive disorder, recurrent, moderate: Secondary | ICD-10-CM | POA: Diagnosis not present

## 2021-03-17 DIAGNOSIS — E782 Mixed hyperlipidemia: Secondary | ICD-10-CM | POA: Diagnosis not present

## 2021-03-17 DIAGNOSIS — G20 Parkinson's disease: Secondary | ICD-10-CM | POA: Diagnosis not present

## 2021-03-17 DIAGNOSIS — R7303 Prediabetes: Secondary | ICD-10-CM | POA: Diagnosis not present

## 2021-03-17 DIAGNOSIS — N1831 Chronic kidney disease, stage 3a: Secondary | ICD-10-CM | POA: Diagnosis not present

## 2021-03-17 DIAGNOSIS — Z0000 Encounter for general adult medical examination without abnormal findings: Secondary | ICD-10-CM | POA: Diagnosis not present

## 2021-03-17 DIAGNOSIS — Z1389 Encounter for screening for other disorder: Secondary | ICD-10-CM | POA: Diagnosis not present

## 2021-03-17 DIAGNOSIS — I739 Peripheral vascular disease, unspecified: Secondary | ICD-10-CM | POA: Diagnosis not present

## 2021-03-18 DIAGNOSIS — R499 Unspecified voice and resonance disorder: Secondary | ICD-10-CM | POA: Diagnosis not present

## 2021-03-18 DIAGNOSIS — R41841 Cognitive communication deficit: Secondary | ICD-10-CM | POA: Diagnosis not present

## 2021-03-18 DIAGNOSIS — R488 Other symbolic dysfunctions: Secondary | ICD-10-CM | POA: Diagnosis not present

## 2021-03-18 DIAGNOSIS — Z20828 Contact with and (suspected) exposure to other viral communicable diseases: Secondary | ICD-10-CM | POA: Diagnosis not present

## 2021-03-19 DIAGNOSIS — R2681 Unsteadiness on feet: Secondary | ICD-10-CM | POA: Diagnosis not present

## 2021-03-19 DIAGNOSIS — I951 Orthostatic hypotension: Secondary | ICD-10-CM | POA: Diagnosis not present

## 2021-03-19 DIAGNOSIS — R296 Repeated falls: Secondary | ICD-10-CM | POA: Diagnosis not present

## 2021-03-19 DIAGNOSIS — M6281 Muscle weakness (generalized): Secondary | ICD-10-CM | POA: Diagnosis not present

## 2021-03-19 DIAGNOSIS — R2689 Other abnormalities of gait and mobility: Secondary | ICD-10-CM | POA: Diagnosis not present

## 2021-03-22 DIAGNOSIS — R488 Other symbolic dysfunctions: Secondary | ICD-10-CM | POA: Diagnosis not present

## 2021-03-22 DIAGNOSIS — R41841 Cognitive communication deficit: Secondary | ICD-10-CM | POA: Diagnosis not present

## 2021-03-22 DIAGNOSIS — R499 Unspecified voice and resonance disorder: Secondary | ICD-10-CM | POA: Diagnosis not present

## 2021-03-23 DIAGNOSIS — M6281 Muscle weakness (generalized): Secondary | ICD-10-CM | POA: Diagnosis not present

## 2021-03-24 ENCOUNTER — Emergency Department (HOSPITAL_COMMUNITY): Payer: Medicare HMO

## 2021-03-24 ENCOUNTER — Inpatient Hospital Stay (HOSPITAL_COMMUNITY)
Admission: EM | Admit: 2021-03-24 | Discharge: 2021-03-31 | DRG: 057 | Disposition: A | Discharge status: Skilled Nursing Facility | Payer: Medicare HMO | Source: Skilled Nursing Facility | Attending: Internal Medicine | Admitting: Internal Medicine

## 2021-03-24 ENCOUNTER — Other Ambulatory Visit: Payer: Self-pay

## 2021-03-24 ENCOUNTER — Encounter (HOSPITAL_COMMUNITY): Payer: Self-pay | Admitting: Internal Medicine

## 2021-03-24 ENCOUNTER — Observation Stay (HOSPITAL_COMMUNITY): Payer: Medicare HMO

## 2021-03-24 DIAGNOSIS — Z7901 Long term (current) use of anticoagulants: Secondary | ICD-10-CM | POA: Diagnosis not present

## 2021-03-24 DIAGNOSIS — Z9151 Personal history of suicidal behavior: Secondary | ICD-10-CM

## 2021-03-24 DIAGNOSIS — I480 Paroxysmal atrial fibrillation: Secondary | ICD-10-CM | POA: Diagnosis present

## 2021-03-24 DIAGNOSIS — Z818 Family history of other mental and behavioral disorders: Secondary | ICD-10-CM | POA: Diagnosis not present

## 2021-03-24 DIAGNOSIS — R627 Adult failure to thrive: Secondary | ICD-10-CM | POA: Diagnosis present

## 2021-03-24 DIAGNOSIS — Z48812 Encounter for surgical aftercare following surgery on the circulatory system: Secondary | ICD-10-CM | POA: Diagnosis not present

## 2021-03-24 DIAGNOSIS — S50812A Abrasion of left forearm, initial encounter: Secondary | ICD-10-CM | POA: Diagnosis present

## 2021-03-24 DIAGNOSIS — Z66 Do not resuscitate: Secondary | ICD-10-CM | POA: Diagnosis present

## 2021-03-24 DIAGNOSIS — F17210 Nicotine dependence, cigarettes, uncomplicated: Secondary | ICD-10-CM | POA: Diagnosis present

## 2021-03-24 DIAGNOSIS — R279 Unspecified lack of coordination: Secondary | ICD-10-CM | POA: Diagnosis not present

## 2021-03-24 DIAGNOSIS — M6281 Muscle weakness (generalized): Secondary | ICD-10-CM | POA: Diagnosis not present

## 2021-03-24 DIAGNOSIS — R2681 Unsteadiness on feet: Secondary | ICD-10-CM | POA: Diagnosis not present

## 2021-03-24 DIAGNOSIS — N179 Acute kidney failure, unspecified: Secondary | ICD-10-CM | POA: Diagnosis present

## 2021-03-24 DIAGNOSIS — F332 Major depressive disorder, recurrent severe without psychotic features: Secondary | ICD-10-CM | POA: Diagnosis not present

## 2021-03-24 DIAGNOSIS — R9431 Abnormal electrocardiogram [ECG] [EKG]: Secondary | ICD-10-CM | POA: Diagnosis not present

## 2021-03-24 DIAGNOSIS — Z20818 Contact with and (suspected) exposure to other bacterial communicable diseases: Secondary | ICD-10-CM | POA: Diagnosis not present

## 2021-03-24 DIAGNOSIS — E7800 Pure hypercholesterolemia, unspecified: Secondary | ICD-10-CM | POA: Diagnosis present

## 2021-03-24 DIAGNOSIS — I951 Orthostatic hypotension: Secondary | ICD-10-CM | POA: Diagnosis present

## 2021-03-24 DIAGNOSIS — R296 Repeated falls: Secondary | ICD-10-CM | POA: Diagnosis present

## 2021-03-24 DIAGNOSIS — R001 Bradycardia, unspecified: Secondary | ICD-10-CM | POA: Diagnosis not present

## 2021-03-24 DIAGNOSIS — F17200 Nicotine dependence, unspecified, uncomplicated: Secondary | ICD-10-CM | POA: Diagnosis present

## 2021-03-24 DIAGNOSIS — Z716 Tobacco abuse counseling: Secondary | ICD-10-CM

## 2021-03-24 DIAGNOSIS — I4891 Unspecified atrial fibrillation: Secondary | ICD-10-CM | POA: Diagnosis present

## 2021-03-24 DIAGNOSIS — W1830XA Fall on same level, unspecified, initial encounter: Secondary | ICD-10-CM | POA: Diagnosis present

## 2021-03-24 DIAGNOSIS — R55 Syncope and collapse: Secondary | ICD-10-CM

## 2021-03-24 DIAGNOSIS — R4020 Unspecified coma: Secondary | ICD-10-CM | POA: Diagnosis not present

## 2021-03-24 DIAGNOSIS — N1832 Chronic kidney disease, stage 3b: Secondary | ICD-10-CM | POA: Diagnosis present

## 2021-03-24 DIAGNOSIS — N189 Chronic kidney disease, unspecified: Secondary | ICD-10-CM | POA: Diagnosis not present

## 2021-03-24 DIAGNOSIS — R069 Unspecified abnormalities of breathing: Secondary | ICD-10-CM | POA: Diagnosis not present

## 2021-03-24 DIAGNOSIS — G20 Parkinson's disease: Secondary | ICD-10-CM | POA: Diagnosis present

## 2021-03-24 DIAGNOSIS — F0280 Dementia in other diseases classified elsewhere without behavioral disturbance: Secondary | ICD-10-CM | POA: Diagnosis present

## 2021-03-24 DIAGNOSIS — Z6828 Body mass index (BMI) 28.0-28.9, adult: Secondary | ICD-10-CM | POA: Diagnosis not present

## 2021-03-24 DIAGNOSIS — Z20822 Contact with and (suspected) exposure to covid-19: Secondary | ICD-10-CM | POA: Diagnosis present

## 2021-03-24 DIAGNOSIS — R531 Weakness: Secondary | ICD-10-CM | POA: Diagnosis not present

## 2021-03-24 DIAGNOSIS — E860 Dehydration: Secondary | ICD-10-CM | POA: Diagnosis present

## 2021-03-24 DIAGNOSIS — R0902 Hypoxemia: Secondary | ICD-10-CM | POA: Diagnosis present

## 2021-03-24 DIAGNOSIS — I129 Hypertensive chronic kidney disease with stage 1 through stage 4 chronic kidney disease, or unspecified chronic kidney disease: Secondary | ICD-10-CM | POA: Diagnosis present

## 2021-03-24 DIAGNOSIS — F02818 Dementia in other diseases classified elsewhere, unspecified severity, with other behavioral disturbance: Secondary | ICD-10-CM | POA: Diagnosis present

## 2021-03-24 DIAGNOSIS — Z743 Need for continuous supervision: Secondary | ICD-10-CM | POA: Diagnosis not present

## 2021-03-24 DIAGNOSIS — S50811A Abrasion of right forearm, initial encounter: Secondary | ICD-10-CM | POA: Diagnosis present

## 2021-03-24 DIAGNOSIS — E669 Obesity, unspecified: Secondary | ICD-10-CM | POA: Diagnosis present

## 2021-03-24 DIAGNOSIS — G252 Other specified forms of tremor: Secondary | ICD-10-CM | POA: Diagnosis present

## 2021-03-24 DIAGNOSIS — R1312 Dysphagia, oropharyngeal phase: Secondary | ICD-10-CM | POA: Diagnosis not present

## 2021-03-24 DIAGNOSIS — F319 Bipolar disorder, unspecified: Secondary | ICD-10-CM | POA: Diagnosis present

## 2021-03-24 DIAGNOSIS — I10 Essential (primary) hypertension: Secondary | ICD-10-CM | POA: Diagnosis not present

## 2021-03-24 DIAGNOSIS — R262 Difficulty in walking, not elsewhere classified: Secondary | ICD-10-CM | POA: Diagnosis not present

## 2021-03-24 DIAGNOSIS — R404 Transient alteration of awareness: Secondary | ICD-10-CM | POA: Diagnosis not present

## 2021-03-24 DIAGNOSIS — Z7401 Bed confinement status: Secondary | ICD-10-CM | POA: Diagnosis not present

## 2021-03-24 DIAGNOSIS — J069 Acute upper respiratory infection, unspecified: Secondary | ICD-10-CM | POA: Diagnosis not present

## 2021-03-24 DIAGNOSIS — R569 Unspecified convulsions: Secondary | ICD-10-CM | POA: Diagnosis not present

## 2021-03-24 LAB — COMPREHENSIVE METABOLIC PANEL
ALT: <5 U/L (ref 0–44)
AST: 22 U/L (ref 15–41)
Albumin: 3.5 g/dL (ref 3.5–5.0)
Alkaline Phosphatase: 59 U/L (ref 38–126)
Anion gap: 7 (ref 5–15)
BUN: 46 mg/dL — ABNORMAL HIGH (ref 8–23)
CO2: 22 mmol/L (ref 22–32)
Calcium: 10.1 mg/dL (ref 8.9–10.3)
Chloride: 111 mmol/L (ref 98–111)
Creatinine, Ser: 2.24 mg/dL — ABNORMAL HIGH (ref 0.61–1.24)
GFR, Estimated: 29 mL/min — ABNORMAL LOW (ref >60)
Glucose, Bld: 130 mg/dL — ABNORMAL HIGH (ref 70–99)
Potassium: 4.3 mmol/L (ref 3.5–5.1)
Sodium: 140 mmol/L (ref 135–145)
Total Bilirubin: 0.6 mg/dL (ref 0.3–1.2)
Total Protein: 6.4 g/dL — ABNORMAL LOW (ref 6.5–8.1)

## 2021-03-24 LAB — CBC WITH DIFFERENTIAL/PLATELET
Abs Immature Granulocytes: 0.05 10*3/uL (ref 0.00–0.07)
Basophils Absolute: 0 10*3/uL (ref 0.0–0.1)
Basophils Relative: 0 %
Eosinophils Absolute: 0 10*3/uL (ref 0.0–0.5)
Eosinophils Relative: 0 %
HCT: 34.2 % — ABNORMAL LOW (ref 39.0–52.0)
Hemoglobin: 10.7 g/dL — ABNORMAL LOW (ref 13.0–17.0)
Immature Granulocytes: 1 %
Lymphocytes Relative: 5 %
Lymphs Abs: 0.5 10*3/uL — ABNORMAL LOW (ref 0.7–4.0)
MCH: 28.9 pg (ref 26.0–34.0)
MCHC: 31.3 g/dL (ref 30.0–36.0)
MCV: 92.4 fL (ref 80.0–100.0)
Monocytes Absolute: 0.8 10*3/uL (ref 0.1–1.0)
Monocytes Relative: 7 %
Neutro Abs: 9 10*3/uL — ABNORMAL HIGH (ref 1.7–7.7)
Neutrophils Relative %: 87 %
Platelets: 162 10*3/uL (ref 150–400)
RBC: 3.7 MIL/uL — ABNORMAL LOW (ref 4.22–5.81)
RDW: 12.8 % (ref 11.5–15.5)
WBC: 10.3 10*3/uL (ref 4.0–10.5)
nRBC: 0 % (ref 0.0–0.2)

## 2021-03-24 LAB — RESP PANEL BY RT-PCR (FLU A&B, COVID) ARPGX2
Influenza A by PCR: NEGATIVE
Influenza B by PCR: NEGATIVE
SARS Coronavirus 2 by RT PCR: NEGATIVE

## 2021-03-24 LAB — CK: Total CK: 155 U/L (ref 49–397)

## 2021-03-24 MED ORDER — ACETAMINOPHEN 650 MG RE SUPP: 650.0000 mg | Freq: Four times a day (QID) | RECTAL | Status: DC | PRN

## 2021-03-24 MED ORDER — ENOXAPARIN SODIUM 40 MG/0.4ML IJ SOSY
40.0000 mg | PREFILLED_SYRINGE | INTRAMUSCULAR | Status: DC
  Administered 2021-03-24 – 2021-03-30 (×7): 40 mg via SUBCUTANEOUS
  Filled 2021-03-24 (×7): qty 0.4

## 2021-03-24 MED ORDER — SODIUM CHLORIDE 0.9 % IV SOLN: INTRAVENOUS | Status: AC

## 2021-03-24 MED ORDER — SIMVASTATIN 20 MG PO TABS
10.0000 mg | ORAL_TABLET | Freq: Every day | ORAL | Status: DC
  Administered 2021-03-24 – 2021-03-30 (×7): 10 mg via ORAL
  Filled 2021-03-24 (×7): qty 1

## 2021-03-24 MED ORDER — ONDANSETRON HCL 4 MG/2ML IJ SOLN: 4.0000 mg | Freq: Four times a day (QID) | INTRAMUSCULAR | Status: DC | PRN

## 2021-03-24 MED ORDER — SERTRALINE HCL 100 MG PO TABS
100.0000 mg | ORAL_TABLET | Freq: Every day | ORAL | Status: DC
  Administered 2021-03-25 – 2021-03-31 (×7): 100 mg via ORAL
  Filled 2021-03-24 (×8): qty 1

## 2021-03-24 MED ORDER — RISPERIDONE 0.5 MG PO TABS
0.5000 mg | ORAL_TABLET | Freq: Every day | ORAL | Status: DC
  Administered 2021-03-24 – 2021-03-30 (×7): 0.5 mg via ORAL
  Filled 2021-03-24 (×8): qty 1

## 2021-03-24 MED ORDER — FENOFIBRATE 54 MG PO TABS
54.0000 mg | ORAL_TABLET | Freq: Every day | ORAL | Status: DC
  Administered 2021-03-25 – 2021-03-31 (×7): 54 mg via ORAL
  Filled 2021-03-24 (×8): qty 1

## 2021-03-24 MED ORDER — VITAMIN B-12 1000 MCG PO TABS
1000.0000 ug | ORAL_TABLET | Freq: Every day | ORAL | Status: DC
  Administered 2021-03-25 – 2021-03-31 (×7): 1000 ug via ORAL
  Filled 2021-03-24 (×7): qty 1

## 2021-03-24 MED ORDER — ACETAMINOPHEN 325 MG PO TABS: 650.0000 mg | ORAL_TABLET | Freq: Four times a day (QID) | ORAL | Status: DC | PRN

## 2021-03-24 MED ORDER — CARBIDOPA-LEVODOPA 25-100 MG PO TABS
1.0000 | ORAL_TABLET | Freq: Three times a day (TID) | ORAL | Status: DC
  Administered 2021-03-24 – 2021-03-31 (×19): 1 via ORAL
  Filled 2021-03-24 (×22): qty 1

## 2021-03-24 MED ORDER — ONDANSETRON HCL 4 MG PO TABS: 4.0000 mg | ORAL_TABLET | Freq: Four times a day (QID) | ORAL | Status: DC | PRN

## 2021-03-24 MED ORDER — ASPIRIN EC 81 MG PO TBEC
81.0000 mg | DELAYED_RELEASE_TABLET | Freq: Every day | ORAL | Status: DC
  Administered 2021-03-24 – 2021-03-31 (×8): 81 mg via ORAL
  Filled 2021-03-24 (×8): qty 1

## 2021-03-24 MED ORDER — SODIUM CHLORIDE 0.9 % IV BOLUS
500.0000 mL | Freq: Once | INTRAVENOUS | Status: AC
  Administered 2021-03-24: 500 mL via INTRAVENOUS
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
## 2021-03-24 NOTE — ED Notes (Signed)
Provider at bedside

## 2021-03-24 NOTE — ED Notes (Signed)
Pt reported feeling "off" when he stood up. Pt unable to tolerate standing for more than 3 minutes. Orthostatic done. [place], PA-C aware.
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
## 2021-03-24 NOTE — ED Notes (Signed)
Pt still unable to urinate, urinal at bedside.

## 2021-03-25 DIAGNOSIS — R55 Syncope and collapse: Secondary | ICD-10-CM | POA: Diagnosis not present

## 2021-03-25 LAB — BASIC METABOLIC PANEL
Anion gap: 5 (ref 5–15)
BUN: 43 mg/dL — ABNORMAL HIGH (ref 8–23)
CO2: 24 mmol/L (ref 22–32)
Calcium: 10 mg/dL (ref 8.9–10.3)
Chloride: 111 mmol/L (ref 98–111)
Creatinine, Ser: 2.17 mg/dL — ABNORMAL HIGH (ref 0.61–1.24)
GFR, Estimated: 30 mL/min — ABNORMAL LOW (ref >60)
Glucose, Bld: 97 mg/dL (ref 70–99)
Potassium: 4.2 mmol/L (ref 3.5–5.1)
Sodium: 140 mmol/L (ref 135–145)

## 2021-03-25 LAB — CBC
HCT: 31.7 % — ABNORMAL LOW (ref 39.0–52.0)
Hemoglobin: 9.8 g/dL — ABNORMAL LOW (ref 13.0–17.0)
MCH: 28.8 pg (ref 26.0–34.0)
MCHC: 30.9 g/dL (ref 30.0–36.0)
MCV: 93.2 fL (ref 80.0–100.0)
Platelets: 153 10*3/uL (ref 150–400)
RBC: 3.4 MIL/uL — ABNORMAL LOW (ref 4.22–5.81)
RDW: 13 % (ref 11.5–15.5)
WBC: 6.1 10*3/uL (ref 4.0–10.5)
nRBC: 0 % (ref 0.0–0.2)

## 2021-03-25 MED ORDER — MIDODRINE HCL 5 MG PO TABS
5.0000 mg | ORAL_TABLET | Freq: Two times a day (BID) | ORAL | Status: DC
  Administered 2021-03-25 – 2021-03-30 (×10): 5 mg via ORAL
  Filled 2021-03-25 (×10): qty 1

## 2021-03-25 MED ORDER — SODIUM CHLORIDE 0.9 % IV SOLN: INTRAVENOUS | Status: DC

## 2021-03-25 MED ORDER — MIDODRINE HCL 5 MG PO TABS: 5.0000 mg | ORAL_TABLET | Freq: Two times a day (BID) | ORAL | Status: DC

## 2021-03-25 NOTE — Evaluation (Signed)
Physical Therapy Evaluation Patient Details Name: Fernando Carr MRN: [id number] DOB: 1941-04-17 Today's Date: 03/25/2021   History of Present Illness  The pt is an 80 yo male presenting 8/24 after x3 syncopal episodes. Of note, pt with recent admission in July of 2022 for similar syncopal episode with hypotension. Upon workup, pt also found to have AKI on CKD IIIb. PMH includes: Parkinson's disease, dysautonomia from Parkinson's disease, frequent syncopal episode, CKD stage IIIb, A. fib on Eliquis, and AAA.   Clinical Impression  Pt in bed upon arrival of PT, agreeable to evaluation at this time. Prior to admission the pt was mobilizing with use of rollator, but reports full independence with all ADLs as he is currently living at [facility]. The pt now presents with limitations in functional mobility, strength, power, stability, posture, and activity tolerance due to above dx, and will continue to benefit from skilled PT to address these deficits. The pt was limited to short bout of ambulation in the room due to reports of fatigue at this time, but was able to complete with minA-minG and use of RW for BUE support. The pt ambulates with significant trunk flexion and minimal clearance bilaterally, is at increased risk of falls and will benefit from short stint of rehab to maximize safety with independence as the pt reports he is unable to afford transition to assisted living.      Follow Up Recommendations SNF;Supervision - Intermittent    Equipment Recommendations  None recommended by PT    Recommendations for Other Services       Precautions / Restrictions Precautions Precautions: Fall Precaution Comments: watch BP Restrictions Weight Bearing Restrictions: No      Mobility  Bed Mobility Overal bed mobility: Needs Assistance Bed Mobility: Supine to Sit     Supine to sit: Modified independent (Device/Increase time)     General bed mobility comments: pt with increased time,  able to come to long sitting in bed without assist, move to sitting EOB with increased time    Transfers Overall transfer level: Needs assistance Equipment used: Rolling walker (2 wheeled) Transfers: Sit to/from Stand Sit to Stand: Min assist         General transfer comment: pt needing increased time and minA to generate hip clearance from EOB. pt needing BUE to support in standing  Ambulation/Gait Ambulation/Gait assistance: Min guard Gait Distance (Feet): 15 Feet Assistive device: Rolling walker (2 wheeled) Gait Pattern/deviations: Step-through pattern;Shuffle;Trunk flexed Gait velocity: decreased Gait velocity interpretation: <1.31 ft/sec, indicative of household ambulator General Gait Details: pt with significant trunk flexion and forward head posture. slow gait with shuffling steps, minimal clearance, increased tremors. No overt LOB, pt with no c/o dizziness     Balance Overall balance assessment: Needs assistance Sitting-balance support: Feet supported;No upper extremity supported Sitting balance-Leahy Scale: Fair     Standing balance support: Bilateral upper extremity supported Standing balance-Leahy Scale: Poor Standing balance comment: reliant on BUE support in standing                             Pertinent Vitals/Pain Pain Assessment: No/denies pain    Home Living Family/patient expects to be discharged to:: Private residence Living Arrangements: Alone Available Help at Discharge: Family (limited assist) Type of Home: Independent living facility Home Access: Level entry     Home Layout: One level Home Equipment: Bedside commode;Shower seat;Walker - 4 wheels;Wheelchair - manual;Grab bars - tub/shower;Grab bars - toilet Additional  Comments: pt reports sleeping on the couch    Prior Function Level of Independence: Independent with assistive device(s)         Comments: Pt reports ambulating with rollator, taking "laps". Pt independent with  ADLs but reports he is not fast     Hand Dominance   Dominant Hand: Right    Extremity/Trunk Assessment   Upper Extremity Assessment Upper Extremity Assessment: Defer to OT evaluation    Lower Extremity Assessment Lower Extremity Assessment: Generalized weakness    Cervical / Trunk Assessment Cervical / Trunk Assessment: Kyphotic  Communication   Communication: No difficulties  Cognition Arousal/Alertness: Awake/alert Behavior During Therapy: WFL for tasks assessed/performed Overall Cognitive Status: Within Functional Limits for tasks assessed                                        General Comments General comments (skin integrity, edema, etc.): BP elevated sitting EOB (map of 175) unable to determine if due to activity/tremors    Exercises     Assessment/Plan    PT Assessment Patient needs continued PT services  PT Problem List Decreased strength;Decreased range of motion;Decreased activity tolerance;Decreased balance;Decreased mobility;Decreased coordination       PT Treatment Interventions DME instruction;Gait training;Stair training;Functional mobility training;Therapeutic activities;Therapeutic exercise;Balance training;Patient/family education    PT Goals (Current goals can be found in the Care Plan section)  Acute Rehab PT Goals Patient Stated Goal: return home PT Goal Formulation: With patient Time For Goal Achievement: 04/08/21 Potential to Achieve Goals: Good    Frequency Min 3X/week    AM-PAC PT "6 Clicks" Mobility  Outcome Measure Help needed turning from your back to your side while in a flat bed without using bedrails?: None Help needed moving from lying on your back to sitting on the side of a flat bed without using bedrails?: None Help needed moving to and from a bed to a chair (including a wheelchair)?: A Little Help needed standing up from a chair using your arms (e.g., wheelchair or bedside chair)?: A Little Help needed to  walk in hospital room?: A Little Help needed climbing 3-5 steps with a railing? : A Little 6 Click Score: 20    End of Session Equipment Utilized During Treatment: Gait belt Activity Tolerance: Patient tolerated treatment well Patient left: in chair;with call bell/phone within reach;with chair alarm set Nurse Communication: Mobility status (BP) PT Visit Diagnosis: Other abnormalities of gait and mobility (R26.89);Muscle weakness (generalized) (M62.81);Repeated falls (R29.6)    Time: [id number] PT Time Calculation (min) (ACUTE ONLY): 36 min   Charges:   PT Evaluation $PT Eval Low Complexity: 1 Low PT Treatments $Therapeutic Activity: 8-22 mins        Inocencio Homes, PT, DPT   Acute Rehabilitation Department Pager #: [phone]  Sandra Cockayne 03/25/2021, 6:45 PM

## 2021-03-25 NOTE — Progress Notes (Signed)
PROGRESS NOTE    Fernando Carr  [id number] DOB: 01-11-1941 DOA: 03/24/2021 PCP: [name], MD   Brief Narrative: 80 year old with past medical history significant for Parkinson's disease, dysautonomia from Parkinson's disease, frequent syncopal episode, CKD stage IIIb, A. fib on Eliquis, AAA who presents to the ED via EMS for evaluation of 3 syncopal episode.  Patient was found on the ground with abrasions of both forearms.  He was also noted to be hypotensive.  He had a witnessed syncopal episode by his caregiver who said that he has some jerking movement associated with his syncopal episode.  Prior admission June through July for syncopal episode related to orthostatic hypotension from dysautonomia.   Patient was also found to have AKI on CKD creatinine of 2.2 compared to baseline of 1.7.  Assessment & Plan:   Principal Problem:   Syncope and collapse Active Problems:   Afib (HCC)   Hypotension, postural   MDD (major depressive disorder), recurrent severe, without psychosis ([place])   Parkinson's disease (HCC)   Resting tremor   Nicotine dependence  1-Syncope likely secondary to orthostatic hypotension from dysautonomia in the setting of Parkinson disease: Pertinent of dehydration in the setting of AKI -Continue with IV fluids. -Continue  to monitor on telemetry -Resume midodrine.  Start TED hose compression -PT eval.  -Will order abdominal Binder as well.   2-AKI on CKD stage IIIb; Prior cr 1.7--1.9 Continue with IV fluids for another 24 hours.  Cr stable at 2.1. continue with IV fluids.   3-A. fib: Anticoagulation presents related to frequent syncopal episode at increased risk for fall and bleeding  4-Parkinson disease: Continue with Sinemet 5-Depression: Continue with sertraline and risperidone 6-HLD; continue with fenofibrate and simvastatin 7-Tobacco Abuse: counseling.  Obesity  HTN; would allow high BP, due to high risk for hypotension and fall.     Estimated body mass index is 28.48 kg/m as calculated from the following:   Height as of 02/23/21: 6' (1.829 m).   Weight as of 03/11/21: 95.3 kg.   DVT prophylaxis: Lovenox Code Status: DNR Family Communication: Care discussed with patient  Disposition Plan:  Status is: Observation  The patient remains OBS appropriate and will d/c before 2 midnights.  Dispo: The patient is from: independent living.               Anticipated d/c is to:  might need SNF              Patient currently is not medically stable to d/c.   Difficult to place patient No        Consultants:  None  Procedures:  None  Antimicrobials:    Subjective: He is alert, he was taking midodrine.  He use ted hose for 2 days.  He had holter monitor, that he needs to follow up results with his PCP. He was not wearing monitor during episode yesterday.   Objective: Vitals:   03/25/21 0315 03/25/21 0830 03/25/21 0835 03/25/21 1149  BP: (!) 144/91 (!) 186/91 (!) 170/76 110/74  Pulse: 60  62   Resp: '14 16 19 15  '$ Temp: (!) 97.5 F (36.4 C)   (!) 97.5 F (36.4 C)  TempSrc: Oral   Oral  SpO2: 100%       Intake/Output Summary (Last 24 hours) at 03/25/2021 1556 Last data filed at 03/25/2021 1439 Gross per 24 hour  Intake 560 ml  Output 175 ml  Net 385 ml   There were no vitals filed for this visit.  Examination:  General exam: Appears calm and comfortable  Respiratory system: Clear to auscultation. Respiratory effort normal. Cardiovascular system: S1 & S2 heard, RRR. No JVD, murmurs, rubs, gallops or clicks. No pedal edema. Gastrointestinal system: Abdomen is nondistended, soft and nontender. No organomegaly or masses felt. Normal bowel sounds heard. Central nervous system: Alert and oriented. Tremors hands.  Extremities: Symmetric 5 x 5 power.   Data Reviewed: I have personally reviewed following labs and imaging studies  CBC: Recent Labs  Lab 03/24/21 1347 03/25/21 0154  WBC 10.3 6.1   NEUTROABS 9.0*  --   HGB 10.7* 9.8*  HCT 34.2* 31.7*  MCV 92.4 93.2  PLT 162 0000000   Basic Metabolic Panel: Recent Labs  Lab 03/24/21 1347 03/25/21 0154  NA 140 140  K 4.3 4.2  CL 111 111  CO2 22 24  GLUCOSE 130* 97  BUN 46* 43*  CREATININE 2.24* 2.17*  CALCIUM 10.1 10.0   GFR: CrCl cannot be calculated (Unknown ideal weight.). Liver Function Tests: Recent Labs  Lab 03/24/21 1347  AST 22  ALT <5  ALKPHOS 59  BILITOT 0.6  PROT 6.4*  ALBUMIN 3.5   No results for input(s): LIPASE, AMYLASE in the last 168 hours. No results for input(s): AMMONIA in the last 168 hours. Coagulation Profile: No results for input(s): INR, PROTIME in the last 168 hours. Cardiac Enzymes: Recent Labs  Lab 03/24/21 1554  CKTOTAL 155   BNP (last 3 results) No results for input(s): PROBNP in the last 8760 hours. HbA1C: No results for input(s): HGBA1C in the last 72 hours. CBG: No results for input(s): GLUCAP in the last 168 hours. Lipid Profile: No results for input(s): CHOL, HDL, LDLCALC, TRIG, CHOLHDL, LDLDIRECT in the last 72 hours. Thyroid Function Tests: No results for input(s): TSH, T4TOTAL, FREET4, T3FREE, THYROIDAB in the last 72 hours. Anemia Panel: No results for input(s): VITAMINB12, FOLATE, FERRITIN, TIBC, IRON, RETICCTPCT in the last 72 hours. Sepsis Labs: No results for input(s): PROCALCITON, LATICACIDVEN in the last 168 hours.  Recent Results (from the past 240 hour(s))  Resp Panel by RT-PCR (Flu A&B, Covid) Nasopharyngeal Swab     Status: None   Collection Time: 03/24/21  2:18 PM   Specimen: Nasopharyngeal Swab; Nasopharyngeal(NP) swabs in vial transport medium  Result Value Ref Range Status   SARS Coronavirus 2 by RT PCR NEGATIVE NEGATIVE Final    Comment: (NOTE) SARS-CoV-2 target nucleic acids are NOT DETECTED.  The SARS-CoV-2 RNA is generally detectable in upper respiratory specimens during the acute phase of infection. The lowest concentration of SARS-CoV-2  viral copies this assay can detect is 138 copies/mL. A negative result does not preclude SARS-Cov-2 infection and should not be used as the sole basis for treatment or other patient management decisions. A negative result may occur with  improper specimen collection/handling, submission of specimen other than nasopharyngeal swab, presence of viral mutation(s) within the areas targeted by this assay, and inadequate number of viral copies(<138 copies/mL). A negative result must be combined with clinical observations, patient history, and epidemiological information. The expected result is Negative.  Fact Sheet for Patients:  EntrepreneurPulse.com.au  Fact Sheet for Healthcare Providers:  IncredibleEmployment.be  This test is no t yet approved or cleared by the [place] FDA and  has been authorized for detection and/or diagnosis of SARS-CoV-2 by FDA under an Emergency Use Authorization (EUA). This EUA will remain  in effect (meaning this test can be used) for the duration of the COVID-19 declaration under Section  564(b)(1) of the Act, 21 U.S.C.section 360bbb-3(b)(1), unless the authorization is terminated  or revoked sooner.       Influenza A by PCR NEGATIVE NEGATIVE Final   Influenza B by PCR NEGATIVE NEGATIVE Final    Comment: (NOTE) The Xpert Xpress SARS-CoV-2/FLU/RSV plus assay is intended as an aid in the diagnosis of influenza from Nasopharyngeal swab specimens and should not be used as a sole basis for treatment. Nasal washings and aspirates are unacceptable for Xpert Xpress SARS-CoV-2/FLU/RSV testing.  Fact Sheet for Patients: EntrepreneurPulse.com.au  Fact Sheet for Healthcare Providers: IncredibleEmployment.be  This test is not yet approved or cleared by the [place] FDA and has been authorized for detection and/or diagnosis of SARS-CoV-2 by FDA under an Emergency Use Authorization  (EUA). This EUA will remain in effect (meaning this test can be used) for the duration of the COVID-19 declaration under Section 564(b)(1) of the Act, 21 U.S.C. section 360bbb-3(b)(1), unless the authorization is terminated or revoked.  Performed at [facility], [place] [address], [place], [place] [address]          Radiology Studies: CT HEAD WO CONTRAST (5MM)  Result Date: 03/24/2021 CLINICAL DATA:  Seizure, nontraumatic. EXAM: CT HEAD WITHOUT CONTRAST TECHNIQUE: Contiguous axial images were obtained from the base of the skull through the vertex without intravenous contrast. COMPARISON:  09/26/2019 FINDINGS: Brain: Stable cerebral atrophy. Small amount of low-density in the white matter is similar to the previous examination. No evidence for acute hemorrhage, mass lesion, midline shift, hydrocephalus or large infarct. Vascular: No hyperdense vessel or unexpected calcification. Skull: Normal. Negative for fracture or focal lesion. Sinuses/Orbits: Mild mucosal thickening in the right maxillary sinus. Mucosal thickening in the ethmoid air cells and frontal sinuses. Other: None IMPRESSION: 1. No acute intracranial abnormality. 2. Stable cerebral atrophy. Electronically Signed   By: Markus Daft M.D.   On: 03/24/2021 16:18   DG Chest Port 1 View  Result Date: 03/24/2021 CLINICAL DATA:  Syncope, hypoxia EXAM: PORTABLE CHEST 1 VIEW COMPARISON:  Chest radiograph 02/23/2021 FINDINGS: The heart size is within normal limits. The mediastinum is prominent, similar to the prior study. There is no focal consolidation or pulmonary edema. There is no pleural effusion or pneumothorax. There is no acute osseous abnormality. IMPRESSION: Stable chest with no radiographic evidence of acute cardiopulmonary process. Electronically Signed   By: Valetta Mole M.D.   On: 03/24/2021 13:42        Scheduled Meds:  aspirin EC  81 mg Oral Daily   carbidopa-levodopa  1 tablet Oral TID   enoxaparin (LOVENOX)  injection  40 mg Subcutaneous Q24H   fenofibrate  54 mg Oral Daily   midodrine  5 mg Oral BID WC   risperiDONE  0.5 mg Oral QHS   sertraline  100 mg Oral Daily   simvastatin  10 mg Oral q1800   vitamin B-12  1,000 mcg Oral Daily   Continuous Infusions:  sodium chloride 75 mL/hr at 03/25/21 1454     LOS: 0 days    Time spent: 35 minutes.     Elmarie Shiley, MD [facility]   If 7PM-7AM, please contact night-coverage [email or web address]  03/25/2021, 3:56 PM

## 2021-03-26 DIAGNOSIS — R55 Syncope and collapse: Secondary | ICD-10-CM | POA: Diagnosis not present

## 2021-03-26 LAB — BASIC METABOLIC PANEL
Anion gap: 6 (ref 5–15)
BUN: 33 mg/dL — ABNORMAL HIGH (ref 8–23)
CO2: 23 mmol/L (ref 22–32)
Calcium: 9.8 mg/dL (ref 8.9–10.3)
Chloride: 110 mmol/L (ref 98–111)
Creatinine, Ser: 1.7 mg/dL — ABNORMAL HIGH (ref 0.61–1.24)
GFR, Estimated: 40 mL/min — ABNORMAL LOW (ref >60)
Glucose, Bld: 92 mg/dL (ref 70–99)
Potassium: 4.3 mmol/L (ref 3.5–5.1)
Sodium: 139 mmol/L (ref 135–145)

## 2021-03-26 LAB — CBC
HCT: 30.8 % — ABNORMAL LOW (ref 39.0–52.0)
Hemoglobin: 9.7 g/dL — ABNORMAL LOW (ref 13.0–17.0)
MCH: 29.1 pg (ref 26.0–34.0)
MCHC: 31.5 g/dL (ref 30.0–36.0)
MCV: 92.5 fL (ref 80.0–100.0)
Platelets: 140 10*3/uL — ABNORMAL LOW (ref 150–400)
RBC: 3.33 MIL/uL — ABNORMAL LOW (ref 4.22–5.81)
RDW: 12.9 % (ref 11.5–15.5)
WBC: 5.3 10*3/uL (ref 4.0–10.5)
nRBC: 0 % (ref 0.0–0.2)

## 2021-03-26 NOTE — NC FL2 (Signed)
[place] LEVEL OF CARE SCREENING TOOL     IDENTIFICATION  Patient Name: Fernando Carr Birthdate: [birth year] Sex: male Admission Date (Current Location): 03/24/2021  [facility] and [place] Number:  Herbalist and Address:  [facility], [place] [address], [place], [place] [address]      Provider Number: [id number]  Attending Physician Name and Address:  Elmarie Shiley, MD  Relative Name and Phone Number:       Current Level of Care: Hospital Recommended Level of Care: [place] Prior Approval Number:    Date Approved/Denied:   PASRR Number: Under Review  Discharge Plan: SNF    Current Diagnoses: Patient Active Problem List   Diagnosis Date Noted   Syncope and collapse 03/24/2021   Parkinson's disease ([place])    Resting tremor    Nicotine dependence    Orthostatic hypotension 01/29/2021   Hip fracture ([place]) 08/13/2020   LVH (left ventricular hypertrophy) 11/14/2019   Educated about COVID-19 virus infection 11/14/2019   AKI (acute kidney injury) ([place])    Syncope 09/26/2019   MDD (major depressive disorder), recurrent severe, without psychosis ([place]) 08/20/2014   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   Severe depression ([place])    Major depressive disorder, recurrent, severe without psychotic features ([place])    AAA (abdominal aortic aneurysm) ([place]) 07/07/2014   Dehydration 03/10/2014   FTT (failure to thrive) in adult 03/10/2014   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   Sinus bradycardia 03/10/2014   HTN (hypertension) 03/10/2014   Afib ([place]) 03/09/2014   Hypotension, postural 03/09/2014   CRD (chronic renal disease), stage III ([place]) 01/09/2014   Abdominal aortic aneurysm ([place]) 12/20/2011   Bipolar 1 disorder ([place]) 10/24/2011   Hyperlipemia 10/10/2011   Hypertriglyceridemia 10/03/2011   Hyperlipidemia 03/24/2011    Orientation RESPIRATION BLADDER Height & Weight     Self, Time, Situation, Place  Normal  External catheter, Continent Weight:   Height:     BEHAVIORAL SYMPTOMS/MOOD NEUROLOGICAL BOWEL NUTRITION STATUS      Continent Diet (please see discahrge summary)  AMBULATORY STATUS COMMUNICATION OF NEEDS Skin   Supervision Verbally                         Personal Care Assistance Level of Assistance  Bathing, Feeding, Dressing Bathing Assistance: Limited assistance Feeding assistance: Independent Dressing Assistance: Limited assistance     Functional Limitations Info  Sight, Hearing, Speech Sight Info: Impaired (wears glasses) Hearing Info: Adequate Speech Info: Adequate    SPECIAL CARE FACTORS FREQUENCY  PT (By licensed PT), OT (By licensed OT)     PT Frequency: 5x per week OT Frequency: 5x per week            Contractures Contractures Info: Not present    Additional Factors Info  Code Status, Allergies, Psychotropic Code Status Info: DNR Allergies Info: NKA Psychotropic Info: sertraline (ZOLOFT) tablet 100 mg         Current Medications (03/26/2021):  This is the current hospital active medication list Current Facility-Administered Medications  Medication Dose Route Frequency Provider Last Rate Last Admin   0.9 %  sodium chloride infusion   Intravenous Continuous Regalado, Belkys A, MD 75 mL/hr at 03/26/21 1216 New Bag at 03/26/21 1216   acetaminophen (TYLENOL) tablet 650 mg  650 mg Oral Q6H PRN Agbata, Tochukwu, MD       Or   acetaminophen (TYLENOL) suppository 650 mg  650 mg  Rectal Q6H PRN Agbata, Tochukwu, MD       aspirin EC tablet 81 mg  81 mg Oral Daily Agbata, Tochukwu, MD   81 mg at 03/26/21 0837   carbidopa-levodopa (SINEMET IR) 25-100 MG per tablet immediate release 1 tablet  1 tablet Oral TID Agbata, Tochukwu, MD   1 tablet at 03/26/21 1212   enoxaparin (LOVENOX) injection 40 mg  40 mg Subcutaneous Q24H Agbata, Tochukwu, MD   40 mg at 03/25/21 2327   fenofibrate tablet 54 mg  54 mg Oral Daily Agbata, Tochukwu, MD   54 mg at 03/26/21 0837    midodrine (PROAMATINE) tablet 5 mg  5 mg Oral BID WC Regalado, Belkys A, MD   5 mg at 03/26/21 1038   ondansetron (ZOFRAN) tablet 4 mg  4 mg Oral Q6H PRN Agbata, Tochukwu, MD       Or   ondansetron (ZOFRAN) injection 4 mg  4 mg Intravenous Q6H PRN Agbata, Tochukwu, MD       risperiDONE (RISPERDAL) tablet 0.5 mg  0.5 mg Oral QHS Agbata, Tochukwu, MD   0.5 mg at 03/25/21 2219   sertraline (ZOLOFT) tablet 100 mg  100 mg Oral Daily Agbata, Tochukwu, MD   100 mg at 03/26/21 0837   simvastatin (ZOCOR) tablet 10 mg  10 mg Oral q1800 Agbata, Tochukwu, MD   10 mg at 03/25/21 1852   vitamin B-12 (CYANOCOBALAMIN) tablet 1,000 mcg  1,000 mcg Oral Daily Agbata, Tochukwu, MD   1,000 mcg at 03/26/21 [id number]     Discharge Medications: Please see discharge summary for a list of discharge medications.  Relevant Imaging Results:  Relevant Lab Results:   Additional Information SSN [id number]  [place] COVID-19 Vaccine 10/15/2019 , 09/09/2019  Vinie Sill, LCSW

## 2021-03-26 NOTE — Social Work (Signed)
                                                                                                                          Re: Fernando Carr  Date of Birth:  1941-03-13 Date: 03/26/2021  To Whom It May Concern:  Please be advised that the above-named patient will require a short-term nursing home stay-anticipated 30 days or less for rehabilitation and strengthening. The plan is to return home.

## 2021-03-26 NOTE — Plan of Care (Signed)
  Problem: Health Behavior/Discharge Planning: Goal: Ability to manage health-related needs will improve Outcome: Progressing   Problem: Clinical Measurements: Goal: Will remain free from infection Outcome: Progressing Goal: Diagnostic test results will improve Outcome: Progressing   

## 2021-03-26 NOTE — Progress Notes (Signed)
Patient refused to wear abdominal binder

## 2021-03-26 NOTE — Plan of Care (Signed)
  Problem: Clinical Measurements: Goal: Diagnostic test results will improve Outcome: Progressing   

## 2021-03-26 NOTE — Evaluation (Addendum)
Occupational Therapy Evaluation Patient Details Name: Fernando Carr MRN: [id number] DOB: 1941/06/24 Today's Date: 03/26/2021    History of Present Illness 80 yo male presenting 8/24 after x3 syncopal episodes. Of note, pt with recent admission in July of 2022 for similar syncopal episode with hypotension. Upon workup, pt also found to have AKI on CKD IIIb. PMH includes: Parkinson's disease, dysautonomia from Parkinson's disease, frequent syncopal episode, CKD stage IIIb, A. fib on Eliquis, and AAA.   Clinical Impression   PTA, pt lives at [facility] and reports Modified Independence with ADLs/mobility using Rollator. Pt ambulates to dining hall for meals and completes laundry without assist. Pt presents now with deficits in balance, strength and endurance. Guided pt in ADL AM routine with pt able to complete UB ADLs with Setup Assist, LB ADLs with min guard, min guard for mobility using RW. Encouraged energy conservation strategies to maximize independence at home. On return to chair after ADLs, pt with focused gaze and endorsed not feeling well. BP 78/51 (61) after activity.     Follow Up Recommendations  SNF ([facility], [facility] aide if pt declines SNF)    Equipment Recommendations  None recommended by OT    Recommendations for Other Services       Precautions / Restrictions Precautions Precautions: Fall Precaution Comments: watch BP Restrictions Weight Bearing Restrictions: No      Mobility Bed Mobility Overal bed mobility: Needs Assistance Bed Mobility: Supine to Sit     Supine to sit: Min assist     General bed mobility comments: up in chair    Transfers Overall transfer level: Needs assistance Equipment used: Rolling walker (2 wheeled) Transfers: Sit to/from Stand Sit to Stand: Min guard         General transfer comment: min guard from recliner pushing from armrests. increased attempts/efforts for sit to stand from chair w/o armrests at sink but able to complete without boost  needed    Balance Overall balance assessment: Needs assistance Sitting-balance support: Single extremity supported;Feet supported Sitting balance-Leahy Scale: Fair Sitting balance - Comments: UE on bedrail to maintain balance   Standing balance support: Bilateral upper extremity supported;During functional activity Standing balance-Leahy Scale: Poor Standing balance comment: one UE support standing at sink, BUE with mobility                           ADL either performed or assessed with clinical judgement   ADL Overall ADL's : Needs assistance/impaired Eating/Feeding: Independent;Sitting   Grooming: Set up;Sitting;Oral care;Wash/dry face;Brushing hair;Applying deodorant   Upper Body Bathing: Set up;Sitting   Lower Body Bathing: Min guard;Sit to/from stand Lower Body Bathing Details (indicate cue type and reason): to ensure safety, no overt LOB when bathing peri region in standing Upper Body Dressing : Set up;Sitting Upper Body Dressing Details (indicate cue type and reason): assist for lines Lower Body Dressing: Min guard;Sit to/from stand Lower Body Dressing Details (indicate cue type and reason): able to don socks without issue, increased time. Toilet Transfer: Min guard;Ambulation;RW   Toileting- Water quality scientist and Hygiene: Min guard;Sit to/from stand       Functional mobility during ADLs: Min guard;Rolling walker General ADL Comments: Pt with minor balance/endurance deficits (shuffing gait 2/2 parkinson's likely). Limited primarily by hypotension with activity     Vision Baseline Vision/History: 1 Wears glasses Ability to See in Adequate Light: 0 Adequate Patient Visual Report: No change from baseline Vision Assessment?: No apparent visual deficits  Perception     Praxis      Pertinent Vitals/Pain Pain Assessment: No/denies pain     Hand Dominance Right   Extremity/Trunk Assessment Upper Extremity Assessment Upper Extremity Assessment:  Generalized weakness (tremulous)   Lower Extremity Assessment Lower Extremity Assessment: Defer to PT evaluation   Cervical / Trunk Assessment Cervical / Trunk Assessment: Kyphotic   Communication Communication Communication: No difficulties   Cognition Arousal/Alertness: Awake/alert Behavior During Therapy: WFL for tasks assessed/performed Overall Cognitive Status: Within Functional Limits for tasks assessed                                     General Comments  BP prior to OT entry at 102/61 (74). On return to chair from ADLs at sink, pt noted wtih focused gaze and endorsed not feeling well. BP noted at 78/51 (61). Pt endorses concern over payment for rehab services at [facility], etc    Exercises General Exercises - Lower Extremity Ankle Circles/Pumps: AROM;Both;15 reps;Seated Long Arc Quad: AROM;Both;15 reps;Seated Hip Flexion/Marching: AROM;15 reps;Both;Seated   Shoulder Instructions      Home Living Family/patient expects to be discharged to:: Private residence (ILF) Living Arrangements: Alone Available Help at Discharge: Family;Available PRN/intermittently Type of Home: Independent living facility Home Access: Level entry     Home Layout: One level     Bathroom Shower/Tub: Occupational psychologist: Standard     Home Equipment: Bedside commode;Shower seat;Walker - 4 wheels;Wheelchair - manual;Grab bars - tub/shower;Grab bars - toilet   Additional Comments: pt reports sleeping on the couch      Prior Functioning/Environment Level of Independence: Independent with assistive device(s)        Comments: Pt Modified Independent for ADLs, mobility using Rollator. pt walks 56f vs 1578f(more variety at this one) to dining hall for meals. Uses rollator to transport/complete laundry. denies falls aside from ones causes by syncopal episodes        OT Problem List: Decreased strength;Decreased activity tolerance;Impaired balance (sitting and/or  standing)      OT Treatment/Interventions: Self-care/ADL training;Therapeutic exercise;Energy conservation;DME and/or AE instruction;Therapeutic activities;Balance training;Patient/family education    OT Goals(Current goals can be found in the care plan section) Acute Rehab OT Goals Patient Stated Goal: return home OT Goal Formulation: With patient Time For Goal Achievement: 04/09/21 Potential to Achieve Goals: Good  OT Frequency: Min 2X/week   Barriers to D/C:            Co-evaluation              AM-PAC OT "6 Clicks" Daily Activity     Outcome Measure Help from another person eating meals?: None Help from another person taking care of personal grooming?: A Little Help from another person toileting, which includes using toliet, bedpan, or urinal?: A Little Help from another person bathing (including washing, rinsing, drying)?: A Little Help from another person to put on and taking off regular upper body clothing?: A Little Help from another person to put on and taking off regular lower body clothing?: A Little 6 Click Score: 19   End of Session Equipment Utilized During Treatment: Gait belt;Rolling walker Nurse Communication: Mobility status;Other (comment) (BP)  Activity Tolerance: Patient tolerated treatment well Patient left: in chair;with call bell/phone within reach;with chair alarm set  OT Visit Diagnosis: Unsteadiness on feet (R26.81);Other abnormalities of gait and mobility (R26.89);Muscle weakness (generalized) (M62.81)  [id number] OT Time Calculation (min): 49 min Charges:  OT General Charges $OT Visit: 1 Visit OT Evaluation $OT Eval Low Complexity: 1 Low OT Treatments $Self Care/Home Management : 23-37 mins  Fernando Carr, OTR/L Acute Rehab Services Office: [phone]   Layla Maw 03/26/2021, 11:15 AM

## 2021-03-26 NOTE — Progress Notes (Signed)
PROGRESS NOTE    Fernando Carr  [id number] DOB: July 05, 1941 DOA: 03/24/2021 PCP: [name], MD   Brief Narrative: 80 year old with past medical history significant for Parkinson's disease, dysautonomia from Parkinson's disease, frequent syncopal episode, CKD stage IIIb, A. fib on Eliquis, AAA who presents to the ED via EMS for evaluation of 3 syncopal episode.  Patient was found on the ground with abrasions of both forearms.  He was also noted to be hypotensive.  He had a witnessed syncopal episode by his caregiver who said that he has some jerking movement associated with his syncopal episode.  Prior admission June through July for syncopal episode related to orthostatic hypotension from dysautonomia.   Patient was also found to have AKI on CKD creatinine of 2.2 compared to baseline of 1.7.  Assessment & Plan:   Principal Problem:   Syncope and collapse Active Problems:   Afib (HCC)   Hypotension, postural   MDD (major depressive disorder), recurrent severe, without psychosis ([place])   Parkinson's disease (HCC)   Resting tremor   Nicotine dependence  1-Syncope likely secondary to orthostatic hypotension from dysautonomia in the setting of Parkinson disease: Pertinent of dehydration in the setting of AKI -Continue with IV fluids. -Continue  to monitor on telemetry -Resume midodrine.  Started TED hose compression -PT eval. Needs SNF -abdominal Binder ordered. Explain to patient importance of using binder.  Stable.   2-AKI on CKD stage IIIb; Prior cr 1.7--1.9 Continue with IV fluids for another 24 hours.  Cr down to 1.7  continue with IV fluids.   3-A. fib: Anticoagulation presents related to frequent syncopal episode at increased risk for fall and bleeding  4-Parkinson disease: Continue with Sinemet 5-Depression: Continue with sertraline and risperidone 6-HLD; continue with fenofibrate and simvastatin 7-Tobacco Abuse: counseling.  Obesity  HTN; would allow high BP,  due to high risk for hypotension and fall.    Estimated body mass index is 28.48 kg/m as calculated from the following:   Height as of 02/23/21: 6' (1.829 m).   Weight as of 03/11/21: 95.3 kg.   DVT prophylaxis: Lovenox Code Status: DNR Family Communication: Care discussed with patient  Disposition Plan:  Status is: Observation  The patient remains OBS appropriate and will d/c before 2 midnights.  Dispo: The patient is from: independent living.               Anticipated d/c is to:  might need SNF              Patient currently is medically stable to d/c.   Difficult to place patient No        Consultants:  None  Procedures:  None  Antimicrobials:    Subjective: He is doing well, no further episodes of syncope.  Explain to him importance of using abdominal binder  Objective: Vitals:   03/25/21 2334 03/26/21 0347 03/26/21 1007 03/26/21 1214  BP: (!) 156/85 (!) 169/81 116/60 (!) 159/73  Pulse: 60 (!) 56 65 68  Resp: '16 13 20 20  '$ Temp: 98.5 F (36.9 C) 98.5 F (36.9 C) 97.9 F (36.6 C) (!) 97.5 F (36.4 C)  TempSrc: Oral Oral Oral Oral  SpO2: 100% 99% 99% 97%    Intake/Output Summary (Last 24 hours) at 03/26/2021 1559 Last data filed at 03/26/2021 1517 Gross per 24 hour  Intake 1112.74 ml  Output 975 ml  Net 137.74 ml    There were no vitals filed for this visit.  Examination:  General exam: NAD Respiratory  system: CTA Cardiovascular system: S 1, S 2 RRR Gastrointestinal system: BS present, soft, nt Central nervous system: alert, oriented Extremities: Symmetric power   Data Reviewed: I have personally reviewed following labs and imaging studies  CBC: Recent Labs  Lab 03/24/21 1347 03/25/21 0154 03/26/21 0232  WBC 10.3 6.1 5.3  NEUTROABS 9.0*  --   --   HGB 10.7* 9.8* 9.7*  HCT 34.2* 31.7* 30.8*  MCV 92.4 93.2 92.5  PLT 162 153 140*    Basic Metabolic Panel: Recent Labs  Lab 03/24/21 1347 03/25/21 0154 03/26/21 0232  NA 140 140  139  K 4.3 4.2 4.3  CL 111 111 110  CO2 '22 24 23  '$ GLUCOSE 130* 97 92  BUN 46* 43* 33*  CREATININE 2.24* 2.17* 1.70*  CALCIUM 10.1 10.0 9.8    GFR: CrCl cannot be calculated (Unknown ideal weight.). Liver Function Tests: Recent Labs  Lab 03/24/21 1347  AST 22  ALT <5  ALKPHOS 59  BILITOT 0.6  PROT 6.4*  ALBUMIN 3.5    No results for input(s): LIPASE, AMYLASE in the last 168 hours. No results for input(s): AMMONIA in the last 168 hours. Coagulation Profile: No results for input(s): INR, PROTIME in the last 168 hours. Cardiac Enzymes: Recent Labs  Lab 03/24/21 1554  CKTOTAL 155    BNP (last 3 results) No results for input(s): PROBNP in the last 8760 hours. HbA1C: No results for input(s): HGBA1C in the last 72 hours. CBG: No results for input(s): GLUCAP in the last 168 hours. Lipid Profile: No results for input(s): CHOL, HDL, LDLCALC, TRIG, CHOLHDL, LDLDIRECT in the last 72 hours. Thyroid Function Tests: No results for input(s): TSH, T4TOTAL, FREET4, T3FREE, THYROIDAB in the last 72 hours. Anemia Panel: No results for input(s): VITAMINB12, FOLATE, FERRITIN, TIBC, IRON, RETICCTPCT in the last 72 hours. Sepsis Labs: No results for input(s): PROCALCITON, LATICACIDVEN in the last 168 hours.  Recent Results (from the past 240 hour(s))  Resp Panel by RT-PCR (Flu A&B, Covid) Nasopharyngeal Swab     Status: None   Collection Time: 03/24/21  2:18 PM   Specimen: Nasopharyngeal Swab; Nasopharyngeal(NP) swabs in vial transport medium  Result Value Ref Range Status   SARS Coronavirus 2 by RT PCR NEGATIVE NEGATIVE Final    Comment: (NOTE) SARS-CoV-2 target nucleic acids are NOT DETECTED.  The SARS-CoV-2 RNA is generally detectable in upper respiratory specimens during the acute phase of infection. The lowest concentration of SARS-CoV-2 viral copies this assay can detect is 138 copies/mL. A negative result does not preclude SARS-Cov-2 infection and should not be used as  the sole basis for treatment or other patient management decisions. A negative result may occur with  improper specimen collection/handling, submission of specimen other than nasopharyngeal swab, presence of viral mutation(s) within the areas targeted by this assay, and inadequate number of viral copies(<138 copies/mL). A negative result must be combined with clinical observations, patient history, and epidemiological information. The expected result is Negative.  Fact Sheet for Patients:  EntrepreneurPulse.com.au  Fact Sheet for Healthcare Providers:  IncredibleEmployment.be  This test is no t yet approved or cleared by the [place] FDA and  has been authorized for detection and/or diagnosis of SARS-CoV-2 by FDA under an Emergency Use Authorization (EUA). This EUA will remain  in effect (meaning this test can be used) for the duration of the COVID-19 declaration under Section 564(b)(1) of the Act, 21 U.S.C.section 360bbb-3(b)(1), unless the authorization is terminated  or revoked sooner.  Influenza A by PCR NEGATIVE NEGATIVE Final   Influenza B by PCR NEGATIVE NEGATIVE Final    Comment: (NOTE) The Xpert Xpress SARS-CoV-2/FLU/RSV plus assay is intended as an aid in the diagnosis of influenza from Nasopharyngeal swab specimens and should not be used as a sole basis for treatment. Nasal washings and aspirates are unacceptable for Xpert Xpress SARS-CoV-2/FLU/RSV testing.  Fact Sheet for Patients: EntrepreneurPulse.com.au  Fact Sheet for Healthcare Providers: IncredibleEmployment.be  This test is not yet approved or cleared by the [place] FDA and has been authorized for detection and/or diagnosis of SARS-CoV-2 by FDA under an Emergency Use Authorization (EUA). This EUA will remain in effect (meaning this test can be used) for the duration of the COVID-19 declaration under Section 564(b)(1) of  the Act, 21 U.S.C. section 360bbb-3(b)(1), unless the authorization is terminated or revoked.  Performed at [facility], [place] [address] [place], [place] [address]           Radiology Studies: No results found.      Scheduled Meds:  aspirin EC  81 mg Oral Daily   carbidopa-levodopa  1 tablet Oral TID   enoxaparin (LOVENOX) injection  40 mg Subcutaneous Q24H   fenofibrate  54 mg Oral Daily   midodrine  5 mg Oral BID WC   risperiDONE  0.5 mg Oral QHS   sertraline  100 mg Oral Daily   simvastatin  10 mg Oral q1800   vitamin B-12  1,000 mcg Oral Daily   Continuous Infusions:  sodium chloride 75 mL/hr at 03/26/21 1216     LOS: 0 days    Time spent: 35 minutes.     Elmarie Shiley, MD [facility]   If 7PM-7AM, please contact night-coverage [email or web address]  03/26/2021, 3:59 PM

## 2021-03-26 NOTE — Progress Notes (Signed)
Physical Therapy Treatment Patient Details Name: Fernando Carr MRN: [id number] DOB: 1940/09/12 Today's Date: 03/26/2021    History of Present Illness 80 yo male presenting 8/24 after x3 syncopal episodes. Of note, pt with recent admission in July of 2022 for similar syncopal episode with hypotension. Upon workup, pt also found to have AKI on CKD IIIb. PMH includes: Parkinson's disease, dysautonomia from Parkinson's disease, frequent syncopal episode, CKD stage IIIb, A. fib on Eliquis, and AAA.    PT Comments    Pt tolerated treatment with VSS on RA. Pt ambulated in hall, demonstrating slightly increased distance compared to previous session. Continue to recommend SNF for pt, as deficits remain.  Pre-vitals - HR 63, spO2 99%, BP 117/72 EOB - BP 119/86(97) Post-vitals - BP 169/73(97). HR 76. spO2 91%   Follow Up Recommendations  SNF;Supervision - Intermittent     Equipment Recommendations  None recommended by PT    Recommendations for Other Services       Precautions / Restrictions Precautions Precautions: Fall Precaution Comments: watch BP Restrictions Weight Bearing Restrictions: No    Mobility  Bed Mobility Overal bed mobility: Needs Assistance Bed Mobility: Supine to Sit     Supine to sit: Min assist     General bed mobility comments: Increased time and min A to scoot hips to EOB. Pt long sitting in bed upon arrival    Transfers Overall transfer level: Needs assistance Equipment used: Rolling walker (2 wheeled) Transfers: Sit to/from Stand Sit to Stand: Mod assist         General transfer comment: Sit to stand x2. 1st trial from bed and 2nd from recliner with mod A to power up to stand. Increased timing with verbal cues for hand placement  Ambulation/Gait Ambulation/Gait assistance: Min assist;+2 safety/equipment Gait Distance (Feet): 40 Feet Assistive device: Rolling walker (2 wheeled) Gait Pattern/deviations: Step-to pattern;Shuffle;Trunk flexed Gait  velocity: Decreased Gait velocity interpretation: <1.31 ft/sec, indicative of household ambulator General Gait Details: Min A to navigate RW with +2 for chair follow. Ambulated 41' then an additional 42'.  Verbal cues provided for upright posture and big steps. Pt administered PD meds at start of session. No LOB and no c/o dizziness   Stairs             Wheelchair Mobility    Modified Rankin (Stroke Patients Only)       Balance Overall balance assessment: Needs assistance Sitting-balance support: Single extremity supported;Feet supported Sitting balance-Leahy Scale: Fair Sitting balance - Comments: UE on bedrail to maintain balance   Standing balance support: Bilateral upper extremity supported;During functional activity Standing balance-Leahy Scale: Poor Standing balance comment: Relies on BUE support                            Cognition Arousal/Alertness: Awake/alert Behavior During Therapy: WFL for tasks assessed/performed Overall Cognitive Status: Within Functional Limits for tasks assessed                                        Exercises General Exercises - Lower Extremity Ankle Circles/Pumps: AROM;Both;15 reps;Seated Long Arc Quad: AROM;Both;15 reps;Seated Hip Flexion/Marching: AROM;15 reps;Both;Seated    General Comments       Pertinent Vitals/Pain Pain Assessment: No/denies pain     PT Goals (current goals can now be found in the care plan section) Acute Rehab PT Goals Patient Stated  Goal: return home Progress towards PT goals: Progressing toward goals    Frequency    Min 3X/week      PT Plan Current plan remains appropriate    Co-evaluation              AM-PAC PT "6 Clicks" Mobility   Outcome Measure  Help needed turning from your back to your side while in a flat bed without using bedrails?: A Little Help needed moving from lying on your back to sitting on the side of a flat bed without using  bedrails?: A Little Help needed moving to and from a bed to a chair (including a wheelchair)?: A Little Help needed standing up from a chair using your arms (e.g., wheelchair or bedside chair)?: A Lot Help needed to walk in hospital room?: A Lot Help needed climbing 3-5 steps with a railing? : A Lot 6 Click Score: 15    End of Session Equipment Utilized During Treatment: Gait belt Activity Tolerance: Patient tolerated treatment well Patient left: in chair;with call bell/phone within reach;with chair alarm set Nurse Communication: Mobility status PT Visit Diagnosis: Other abnormalities of gait and mobility (R26.89);Muscle weakness (generalized) (M62.81);Repeated falls (R29.6)     Time: 0822-0859 PT Time Calculation (min) (ACUTE ONLY): 37 min  Charges:  $Gait Training: 8-22 mins $Therapeutic Activity: 8-22 mins                     Louie Casa, SPT Acute Rehab: [phone] [id number]      Domingo Dimes 03/26/2021, 12:13 PM

## 2021-03-26 NOTE — TOC Initial Note (Signed)
Transition of Care [facility]) - Initial/Assessment Note    Patient Details  Name: Fernando Carr MRN: [id number] Date of Birth: 1941/05/06  [facility]) CM/SW Contact:    Vinie Sill, LCSW Phone Number: 03/26/2021, 1:57 PM  Clinical Narrative:                  CSW met with patient. CSW introduced self and explained role. CSW discussed therapy recommendation of short term rehab at [facility]. Patient states he was unsure what to do. CSW explained the SNF process, which also included, insurance approval, transport to the facility,etc. Patient inquired " what's going to happen to my apartment"? CSW explained rehab at SNF is expected to be short term only.  Patient agreed to rehab at [facility]. CSW answered all question. CSW asked if his daughter, [name] or his sister , carol could be contacted to help him process this information and make informative decision. Patient declined but gave CSW permission to call his friend [name] # [phone].   CSW spoke with Anderson [place]- updated on disposition plan. She agrees with short term rehab at [facility].  She states the preferred SNF choice [facility] and along with a few others. CSW sent message to [facility] and requested they review.   TOC will continue to follow and assist with discharge planning.  TOC will provide bed offers once available.   Thurmond Butts, MSW, LCSW Clinical Social Worker     Expected Discharge Plan: Skilled Nursing Facility Barriers to Discharge: Awaiting Regulatory affairs officer (PASRR), Insurance Authorization, Continued Medical Work up, SNF Pending bed offer   Patient Goals and CMS Choice        Expected Discharge Plan and Services Expected Discharge Plan: [place] In-house Referral: Clinical Social Work     Living arrangements for the past 2 months: [place]                                      Prior Living Arrangements/Services Living arrangements for the past 2  months: [place] Lives with:: Self Patient language and need for interpreter reviewed:: No        Need for Family Participation in Patient Care: Yes (Comment) Care giver support system in place?: Yes (comment)   Criminal Activity/Legal Involvement Pertinent to Current Situation/Hospitalization: No - Comment as needed  Activities of Daily Living Home Assistive Devices/Equipment: None ADL Screening (condition at time of admission) Patient's cognitive ability adequate to safely complete daily activities?: Yes Is the patient deaf or have difficulty hearing?: No Does the patient have difficulty seeing, even when wearing glasses/contacts?: No Does the patient have difficulty concentrating, remembering, or making decisions?: No Patient able to express need for assistance with ADLs?: Yes Does the patient have difficulty dressing or bathing?: No Independently performs ADLs?: Yes (appropriate for developmental age) Does the patient have difficulty walking or climbing stairs?: No Weakness of Legs: None Weakness of Arms/Hands: None  Permission Sought/Granted Permission sought to share information with : Family Supports Permission granted to share information with : Yes, Verbal Permission Granted  Share Information with NAME: [name]  Permission granted to share info w AGENCY: SNFs  Permission granted to share info w Relationship: friend  Permission granted to share info w Contact Information: [phone]  Emotional Assessment Appearance:: Appears stated age Attitude/Demeanor/Rapport: Engaged Affect (typically observed): Pleasant, Anxious, Appropriate Orientation: : Oriented to Self, Oriented  to Place, Oriented to  Time, Oriented to Situation Alcohol / Substance Use: Not Applicable Psych Involvement: No (comment)  Admission diagnosis:  Syncope and collapse [R55] Hypoxia [R09.02] Syncope, unspecified syncope type [R55] Acute renal failure superimposed on  chronic kidney disease, unspecified CKD stage, unspecified acute renal failure type ([place]) [N17.9, N18.9] Patient Active Problem List   Diagnosis Date Noted   Syncope and collapse 03/24/2021   Parkinson's disease ([place])    Resting tremor    Nicotine dependence    Orthostatic hypotension 01/29/2021   Hip fracture ([place]) 08/13/2020   LVH (left ventricular hypertrophy) 11/14/2019   Educated about COVID-19 virus infection 11/14/2019   AKI (acute kidney injury) ([place])    Syncope 09/26/2019   MDD (major depressive disorder), recurrent severe, without psychosis ([place]) 08/20/2014   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   Severe depression (HCC)    Major depressive disorder, recurrent, severe without psychotic features ([name])    AAA (abdominal aortic aneurysm) ([place]) 07/07/2014   Dehydration 03/10/2014   FTT (failure to thrive) in adult 03/10/2014   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: [phone]:: [phone]    Social Determinants of Health (SDOH) Interventions    Readmission Risk Interventions No flowsheet data found.

## 2021-03-27 DIAGNOSIS — R55 Syncope and collapse: Secondary | ICD-10-CM | POA: Diagnosis not present

## 2021-03-27 NOTE — Progress Notes (Signed)
Assisted patient with abdominal binder on to get out of bed to chair. Patient bp 123/64, patient was moderate assist to stand and was able to pivot to chair. Gait slightly unsteady. Call bell with in reach. Isabelle Matt, Bettina Gavia RN

## 2021-03-27 NOTE — Progress Notes (Signed)
PROGRESS NOTE    Fernando Carr  [id number] DOB: 1940/12/03 DOA: 03/24/2021 PCP: [name], MD   Brief Narrative: 80 year old with past medical history significant for Parkinson's disease, dysautonomia from Parkinson's disease, frequent syncopal episode, CKD stage IIIb, A. fib on Eliquis, AAA who presents to the ED via EMS for evaluation of 3 syncopal episode.  Patient was found on the ground with abrasions of both forearms.  He was also noted to be hypotensive.  He had a witnessed syncopal episode by his caregiver who said that he has some jerking movement associated with his syncopal episode.  Prior admission June through July for syncopal episode related to orthostatic hypotension from dysautonomia.   Patient was also found to have AKI on CKD creatinine of 2.2 compared to baseline of 1.7.  Assessment & Plan:   Principal Problem:   Syncope and collapse Active Problems:   Afib (HCC)   Hypotension, postural   MDD (major depressive disorder), recurrent severe, without psychosis ([place])   Parkinson's disease (HCC)   Resting tremor   Nicotine dependence  1-Syncope likely secondary to orthostatic hypotension from dysautonomia in the setting of Parkinson disease: Pertinent of dehydration in the setting of AKI -Received IV fluids. NSL fluids today.  -Continue  to monitor on telemetry -Resume midodrine.  Started TED hose compression -PT eval. Needs SNF -abdominal Binder ordered. Explain to patient importance of using binder.  Stable. Awaiting SNF  2-AKI on CKD stage IIIb; Prior cr 1.7--1.9 Continue with IV fluids for another 24 hours.  Cr down to 1.7  NSL fluids today.  Repeat labs tomorrow.   3-A. fib: Not on Anticoagulation probably  related to frequent syncopal episode at increased risk for fall and bleeding  4-Parkinson disease: Continue with Sinemet 5-Depression: Continue with sertraline and risperidone 6-HLD; continue with fenofibrate and simvastatin 7-Tobacco Abuse:  counseling.  Obesity  HTN; would allow high BP, due to high risk for hypotension and fall.    Estimated body mass index is 28.48 kg/m as calculated from the following:   Height as of 02/23/21: 6' (1.829 m).   Weight as of 03/11/21: 95.3 kg.   DVT prophylaxis: Lovenox Code Status: DNR Family Communication: Care discussed with patient. Sister over the phone Disposition Plan:  Status is: Observation  The patient remains OBS appropriate and will d/c before 2 midnights.  Dispo: The patient is from: independent living.               Anticipated d/c is to:  might need SNF              Patient currently is medically stable to d/c.   Difficult to place patient No        Consultants:  None  Procedures:  None  Antimicrobials:    Subjective: No new complaints. He was able to work with PT yesterday, short walk.   Objective: Vitals:   03/26/21 2357 03/27/21 0429 03/27/21 0825 03/27/21 1230  BP: 106/73 (!) 187/113 (!) 148/70 125/84  Pulse: 68 62 66 63  Resp: '18 17 15 17  '$ Temp: (!) 97.4 F (36.3 C) (!) 97.4 F (36.3 C) 97.9 F (36.6 C)   TempSrc: Oral Oral Oral   SpO2: 98% 98% 98% 96%    Intake/Output Summary (Last 24 hours) at 03/27/2021 1303 Last data filed at 03/27/2021 1013 Gross per 24 hour  Intake 1030.53 ml  Output 1225 ml  Net -194.47 ml    There were no vitals filed for this visit.  Examination:  General exam: NAD Respiratory system: CTA Cardiovascular system: S 1, S 2 RRR Gastrointestinal system: BS present, soft, nt Central nervous system: Alert Extremities: No edema    Data Reviewed: I have personally reviewed following labs and imaging studies  CBC: Recent Labs  Lab 03/24/21 1347 03/25/21 0154 03/26/21 0232  WBC 10.3 6.1 5.3  NEUTROABS 9.0*  --   --   HGB 10.7* 9.8* 9.7*  HCT 34.2* 31.7* 30.8*  MCV 92.4 93.2 92.5  PLT 162 153 140*    Basic Metabolic Panel: Recent Labs  Lab 03/24/21 1347 03/25/21 0154 03/26/21 0232  NA 140 140  139  K 4.3 4.2 4.3  CL 111 111 110  CO2 '22 24 23  '$ GLUCOSE 130* 97 92  BUN 46* 43* 33*  CREATININE 2.24* 2.17* 1.70*  CALCIUM 10.1 10.0 9.8    GFR: CrCl cannot be calculated (Unknown ideal weight.). Liver Function Tests: Recent Labs  Lab 03/24/21 1347  AST 22  ALT <5  ALKPHOS 59  BILITOT 0.6  PROT 6.4*  ALBUMIN 3.5    No results for input(s): LIPASE, AMYLASE in the last 168 hours. No results for input(s): AMMONIA in the last 168 hours. Coagulation Profile: No results for input(s): INR, PROTIME in the last 168 hours. Cardiac Enzymes: Recent Labs  Lab 03/24/21 1554  CKTOTAL 155    BNP (last 3 results) No results for input(s): PROBNP in the last 8760 hours. HbA1C: No results for input(s): HGBA1C in the last 72 hours. CBG: No results for input(s): GLUCAP in the last 168 hours. Lipid Profile: No results for input(s): CHOL, HDL, LDLCALC, TRIG, CHOLHDL, LDLDIRECT in the last 72 hours. Thyroid Function Tests: No results for input(s): TSH, T4TOTAL, FREET4, T3FREE, THYROIDAB in the last 72 hours. Anemia Panel: No results for input(s): VITAMINB12, FOLATE, FERRITIN, TIBC, IRON, RETICCTPCT in the last 72 hours. Sepsis Labs: No results for input(s): PROCALCITON, LATICACIDVEN in the last 168 hours.  Recent Results (from the past 240 hour(s))  Resp Panel by RT-PCR (Flu A&B, Covid) Nasopharyngeal Swab     Status: None   Collection Time: 03/24/21  2:18 PM   Specimen: Nasopharyngeal Swab; Nasopharyngeal(NP) swabs in vial transport medium  Result Value Ref Range Status   SARS Coronavirus 2 by RT PCR NEGATIVE NEGATIVE Final    Comment: (NOTE) SARS-CoV-2 target nucleic acids are NOT DETECTED.  The SARS-CoV-2 RNA is generally detectable in upper respiratory specimens during the acute phase of infection. The lowest concentration of SARS-CoV-2 viral copies this assay can detect is 138 copies/mL. A negative result does not preclude SARS-Cov-2 infection and should not be used as  the sole basis for treatment or other patient management decisions. A negative result may occur with  improper specimen collection/handling, submission of specimen other than nasopharyngeal swab, presence of viral mutation(s) within the areas targeted by this assay, and inadequate number of viral copies(<138 copies/mL). A negative result must be combined with clinical observations, patient history, and epidemiological information. The expected result is Negative.  Fact Sheet for Patients:  EntrepreneurPulse.com.au  Fact Sheet for Healthcare Providers:  IncredibleEmployment.be  This test is no t yet approved or cleared by the [place] FDA and  has been authorized for detection and/or diagnosis of SARS-CoV-2 by FDA under an Emergency Use Authorization (EUA). This EUA will remain  in effect (meaning this test can be used) for the duration of the COVID-19 declaration under Section 564(b)(1) of the Act, 21 U.S.C.section 360bbb-3(b)(1), unless the authorization is terminated  or revoked  sooner.       Influenza A by PCR NEGATIVE NEGATIVE Final   Influenza B by PCR NEGATIVE NEGATIVE Final    Comment: (NOTE) The Xpert Xpress SARS-CoV-2/FLU/RSV plus assay is intended as an aid in the diagnosis of influenza from Nasopharyngeal swab specimens and should not be used as a sole basis for treatment. Nasal washings and aspirates are unacceptable for Xpert Xpress SARS-CoV-2/FLU/RSV testing.  Fact Sheet for Patients: EntrepreneurPulse.com.au  Fact Sheet for Healthcare Providers: IncredibleEmployment.be  This test is not yet approved or cleared by the [place] FDA and has been authorized for detection and/or diagnosis of SARS-CoV-2 by FDA under an Emergency Use Authorization (EUA). This EUA will remain in effect (meaning this test can be used) for the duration of the COVID-19 declaration under Section 564(b)(1) of  the Act, 21 U.S.C. section 360bbb-3(b)(1), unless the authorization is terminated or revoked.  Performed at [facility], [place] [address], [place], [place] [address]           Radiology Studies: No results found.      Scheduled Meds:  aspirin EC  81 mg Oral Daily   carbidopa-levodopa  1 tablet Oral TID   enoxaparin (LOVENOX) injection  40 mg Subcutaneous Q24H   fenofibrate  54 mg Oral Daily   midodrine  5 mg Oral BID WC   risperiDONE  0.5 mg Oral QHS   sertraline  100 mg Oral Daily   simvastatin  10 mg Oral q1800   vitamin B-12  1,000 mcg Oral Daily   Continuous Infusions:     LOS: 0 days    Time spent: 35 minutes.     Elmarie Shiley, MD [facility]   If 7PM-7AM, please contact night-coverage [email or web address]  03/27/2021, 1:03 PM

## 2021-03-28 DIAGNOSIS — R55 Syncope and collapse: Secondary | ICD-10-CM | POA: Diagnosis not present

## 2021-03-28 LAB — BASIC METABOLIC PANEL
Anion gap: 10 (ref 5–15)
BUN: 30 mg/dL — ABNORMAL HIGH (ref 8–23)
CO2: 23 mmol/L (ref 22–32)
Calcium: 10.1 mg/dL (ref 8.9–10.3)
Chloride: 105 mmol/L (ref 98–111)
Creatinine, Ser: 1.59 mg/dL — ABNORMAL HIGH (ref 0.61–1.24)
GFR, Estimated: 44 mL/min — ABNORMAL LOW (ref >60)
Glucose, Bld: 108 mg/dL — ABNORMAL HIGH (ref 70–99)
Potassium: 4.1 mmol/L (ref 3.5–5.1)
Sodium: 138 mmol/L (ref 135–145)

## 2021-03-28 NOTE — TOC Progression Note (Signed)
Transition of Care [facility]) - Progression Note    Patient Details  Name: Fernando Carr MRN: [id number] Date of Birth: [birth year]  Transition of Care [facility]) CM/SW [place], [place] Phone Number: [phone] 03/28/2021, 11:15 AM  Clinical Narrative:     CSW uploaded needed PASSR documents.  TOC team will continue to assist with discharge planning needs.   Expected Discharge Plan: Skilled Nursing Facility Barriers to Discharge: [place] (PASRR), Insurance Authorization, Continued Medical Work up, SNF Pending bed offer  Expected Discharge Plan and Services Expected Discharge Plan: [place] In-house Referral: Clinical Social Work     Living arrangements for the past 2 months: [place]                                       Social Determinants of Health (SDOH) Interventions    Readmission Risk Interventions No flowsheet data found.

## 2021-03-28 NOTE — Progress Notes (Signed)
Patient assisted to up chair with breakfast. Patient with abdominal binder on. One assist with walker. Call bell with in reach. Jahira Swiss, Bettina Gavia RN

## 2021-03-28 NOTE — TOC Progression Note (Signed)
Transition of Care [facility]) - Progression Note    Patient Details  Name: Fernando Carr MRN: [id number] Date of Birth: 24-Jun-1941  Transition of Care [facility]) CM/SW [place], [place] Phone Number: [phone] 03/28/2021, 2:53 PM  Clinical Narrative:     CSW spoke with pt's friend Anderson Malta in regards to bed offers. CSW explained that the preferred facility has declined to make a bed offer. CSW provided [facility] with the 2 facilities that has made bed offers. Anderson Malta explained that she was going to follow up with [employer] and [employer]. CSW shared that more than likely more bed offers will come back.  CSW attempted to start pt auth due to being alerted that pt was close to being medically ready for discharge. When trying to submit the authorization in the [employer] portal and error message was received that it was outside of managed dates. CSW followed up with [employer] via phone and they confirmed that pt's insurance had to be verified first before an authorization could be submitted. Jonelle Sidle from [employer] stated it could take up to  24hours to completed this process.   TOC team will continue to assist with discharge planning needs.   Expected Discharge Plan: Skilled Nursing Facility Barriers to Discharge: [place] (PASRR), Insurance Authorization, Continued Medical Work up, SNF Pending bed offer  Expected Discharge Plan and Services Expected Discharge Plan: [place] In-house Referral: Clinical Social Work     Living arrangements for the past 2 months: [place]                                       Social Determinants of Health (SDOH) Interventions    Readmission Risk Interventions No flowsheet data found.

## 2021-03-28 NOTE — Progress Notes (Signed)
PROGRESS NOTE    Fernando Carr  [id number] DOB: 08/09/1940 DOA: 03/24/2021 PCP: [name], MD   Brief Narrative: 80 year old with past medical history significant for Parkinson's disease, dysautonomia from Parkinson's disease, frequent syncopal episode, CKD stage IIIb, A. fib on Eliquis, AAA who presents to the ED via EMS for evaluation of 3 syncopal episode.  Patient was found on the ground with abrasions of both forearms.  He was also noted to be hypotensive.  He had a witnessed syncopal episode by his caregiver who said that he has some jerking movement associated with his syncopal episode.  Prior admission June through July for syncopal episode related to orthostatic hypotension from dysautonomia.   Patient was also found to have AKI on CKD creatinine of 2.2 compared to baseline of 1.7.  Assessment & Plan:   Principal Problem:   Syncope and collapse Active Problems:   Afib (HCC)   Hypotension, postural   MDD (major depressive disorder), recurrent severe, without psychosis ([place])   Parkinson's disease (HCC)   Resting tremor   Nicotine dependence  1-Syncope likely secondary to orthostatic hypotension from dysautonomia in the setting of Parkinson disease: Pertinent of dehydration in the setting of AKI -Received IV fluids. NSL fluids today.  -Continue  to monitor on telemetry -Resume midodrine.  Started TED hose compression -PT eval. Needs SNF -abdominal Binder ordered. Explain to patient importance of using binder.  Stable. Awaiting SNF  2-AKI on CKD stage IIIb; Prior cr 1.7--1.9 Continue with IV fluids for another 24 hours.  Cr down to 1.7  NSL fluids today.  Cr stable. 1.5  3-A. fib: Not on Anticoagulation probably  related to frequent syncopal episode at increased risk for fall and bleeding  4-Parkinson disease: Continue with Sinemet 5-Depression: Continue with sertraline and risperidone 6-HLD; continue with fenofibrate and simvastatin 7-Tobacco Abuse:  counseling.  Obesity  HTN; would allow high BP, due to high risk for hypotension and fall.    Estimated body mass index is 28.48 kg/m as calculated from the following:   Height as of 02/23/21: 6' (1.829 m).   Weight as of 03/11/21: 95.3 kg.   DVT prophylaxis: Lovenox Code Status: DNR Family Communication: Care discussed with patient. Sister over the phone Disposition Plan:  Status is: Observation  The patient remains OBS appropriate and will d/c before 2 midnights.  Dispo: The patient is from: independent living.               Anticipated d/c is to:  might need SNF              Patient currently is medically stable to d/c. Awaiting SNF   Difficult to place patient No        Consultants:  None  Procedures:  None  Antimicrobials:    Subjective: He is using abdominal binder, denies pain, dyspnea.   Objective: Vitals:   03/28/21 0420 03/28/21 0730 03/28/21 0808 03/28/21 1134  BP: (!) 165/75 (!) 182/96 (!) 167/86 (!) 167/86  Pulse: (!) 55 60  61  Resp: '16 18  12  '$ Temp: (!) 97.5 F (36.4 C) 97.7 F (36.5 C)  (!) 97.4 F (36.3 C)  TempSrc: Oral Oral  Oral  SpO2: 99% 99% 96% 98%    Intake/Output Summary (Last 24 hours) at 03/28/2021 1312 Last data filed at 03/28/2021 1200 Gross per 24 hour  Intake 360 ml  Output 1550 ml  Net -1190 ml    There were no vitals filed for this visit.  Examination:  General exam: NAD Respiratory system: CTA Cardiovascular system: S 1, S 2 RRR Gastrointestinal system: BS present, soft, nto Central nervous system: Alert Extremities: No edema   Data Reviewed: I have personally reviewed following labs and imaging studies  CBC: Recent Labs  Lab 03/24/21 1347 03/25/21 0154 03/26/21 0232  WBC 10.3 6.1 5.3  NEUTROABS 9.0*  --   --   HGB 10.7* 9.8* 9.7*  HCT 34.2* 31.7* 30.8*  MCV 92.4 93.2 92.5  PLT 162 153 140*    Basic Metabolic Panel: Recent Labs  Lab 03/24/21 1347 03/25/21 0154 03/26/21 0232 03/28/21 0229   NA 140 140 139 138  K 4.3 4.2 4.3 4.1  CL 111 111 110 105  CO2 '22 24 23 23  '$ GLUCOSE 130* 97 92 108*  BUN 46* 43* 33* 30*  CREATININE 2.24* 2.17* 1.70* 1.59*  CALCIUM 10.1 10.0 9.8 10.1    GFR: CrCl cannot be calculated (Unknown ideal weight.). Liver Function Tests: Recent Labs  Lab 03/24/21 1347  AST 22  ALT <5  ALKPHOS 59  BILITOT 0.6  PROT 6.4*  ALBUMIN 3.5    No results for input(s): LIPASE, AMYLASE in the last 168 hours. No results for input(s): AMMONIA in the last 168 hours. Coagulation Profile: No results for input(s): INR, PROTIME in the last 168 hours. Cardiac Enzymes: Recent Labs  Lab 03/24/21 1554  CKTOTAL 155    BNP (last 3 results) No results for input(s): PROBNP in the last 8760 hours. HbA1C: No results for input(s): HGBA1C in the last 72 hours. CBG: No results for input(s): GLUCAP in the last 168 hours. Lipid Profile: No results for input(s): CHOL, HDL, LDLCALC, TRIG, CHOLHDL, LDLDIRECT in the last 72 hours. Thyroid Function Tests: No results for input(s): TSH, T4TOTAL, FREET4, T3FREE, THYROIDAB in the last 72 hours. Anemia Panel: No results for input(s): VITAMINB12, FOLATE, FERRITIN, TIBC, IRON, RETICCTPCT in the last 72 hours. Sepsis Labs: No results for input(s): PROCALCITON, LATICACIDVEN in the last 168 hours.  Recent Results (from the past 240 hour(s))  Resp Panel by RT-PCR (Flu A&B, Covid) Nasopharyngeal Swab     Status: None   Collection Time: 03/24/21  2:18 PM   Specimen: Nasopharyngeal Swab; Nasopharyngeal(NP) swabs in vial transport medium  Result Value Ref Range Status   SARS Coronavirus 2 by RT PCR NEGATIVE NEGATIVE Final    Comment: (NOTE) SARS-CoV-2 target nucleic acids are NOT DETECTED.  The SARS-CoV-2 RNA is generally detectable in upper respiratory specimens during the acute phase of infection. The lowest concentration of SARS-CoV-2 viral copies this assay can detect is 138 copies/mL. A negative result does not preclude  SARS-Cov-2 infection and should not be used as the sole basis for treatment or other patient management decisions. A negative result may occur with  improper specimen collection/handling, submission of specimen other than nasopharyngeal swab, presence of viral mutation(s) within the areas targeted by this assay, and inadequate number of viral copies(<138 copies/mL). A negative result must be combined with clinical observations, patient history, and epidemiological information. The expected result is Negative.  Fact Sheet for Patients:  EntrepreneurPulse.com.au  Fact Sheet for Healthcare Providers:  IncredibleEmployment.be  This test is no t yet approved or cleared by the [place] FDA and  has been authorized for detection and/or diagnosis of SARS-CoV-2 by FDA under an Emergency Use Authorization (EUA). This EUA will remain  in effect (meaning this test can be used) for the duration of the COVID-19 declaration under Section 564(b)(1) of the Act, 21 U.S.C.section  360bbb-3(b)(1), unless the authorization is terminated  or revoked sooner.       Influenza A by PCR NEGATIVE NEGATIVE Final   Influenza B by PCR NEGATIVE NEGATIVE Final    Comment: (NOTE) The Xpert Xpress SARS-CoV-2/FLU/RSV plus assay is intended as an aid in the diagnosis of influenza from Nasopharyngeal swab specimens and should not be used as a sole basis for treatment. Nasal washings and aspirates are unacceptable for Xpert Xpress SARS-CoV-2/FLU/RSV testing.  Fact Sheet for Patients: EntrepreneurPulse.com.au  Fact Sheet for Healthcare Providers: IncredibleEmployment.be  This test is not yet approved or cleared by the [place] FDA and has been authorized for detection and/or diagnosis of SARS-CoV-2 by FDA under an Emergency Use Authorization (EUA). This EUA will remain in effect (meaning this test can be used) for the duration of  the COVID-19 declaration under Section 564(b)(1) of the Act, 21 U.S.C. section 360bbb-3(b)(1), unless the authorization is terminated or revoked.  Performed at [facility], [place] [address], [place], [place] [address]           Radiology Studies: No results found.      Scheduled Meds:  aspirin EC  81 mg Oral Daily   carbidopa-levodopa  1 tablet Oral TID   enoxaparin (LOVENOX) injection  40 mg Subcutaneous Q24H   fenofibrate  54 mg Oral Daily   midodrine  5 mg Oral BID WC   risperiDONE  0.5 mg Oral QHS   sertraline  100 mg Oral Daily   simvastatin  10 mg Oral q1800   vitamin B-12  1,000 mcg Oral Daily   Continuous Infusions:     LOS: 0 days    Time spent: 35 minutes.     Elmarie Shiley, MD [facility]   If 7PM-7AM, please contact night-coverage [email or web address]  03/28/2021, 1:12 PM

## 2021-03-29 DIAGNOSIS — R55 Syncope and collapse: Secondary | ICD-10-CM | POA: Diagnosis not present

## 2021-03-29 LAB — SARS CORONAVIRUS 2 (TAT 6-24 HRS): SARS Coronavirus 2: NEGATIVE

## 2021-03-29 MED ORDER — POLYETHYLENE GLYCOL 3350 17 G PO PACK
17.0000 g | PACK | Freq: Two times a day (BID) | ORAL | Status: DC
  Administered 2021-03-29 – 2021-03-31 (×3): 17 g via ORAL
  Filled 2021-03-29 (×4): qty 1

## 2021-03-29 MED ORDER — SENNA 8.6 MG PO TABS
1.0000 | ORAL_TABLET | Freq: Two times a day (BID) | ORAL | Status: DC
  Administered 2021-03-29 – 2021-03-31 (×3): 8.6 mg via ORAL
  Filled 2021-03-29 (×4): qty 1

## 2021-03-29 NOTE — Progress Notes (Signed)
PROGRESS NOTE    Fernando Carr  [id number] DOB: January 29, 1941 DOA: 03/24/2021 PCP: [name], MD   Brief Narrative: 80 year old with past medical history significant for Parkinson's disease, dysautonomia from Parkinson's disease, frequent syncopal episode, CKD stage IIIb, A. fib on Eliquis, AAA who presents to the ED via EMS for evaluation of 3 syncopal episode.  Patient was found on the ground with abrasions of both forearms.  He was also noted to be hypotensive.  He had a witnessed syncopal episode by his caregiver who said that he has some jerking movement associated with his syncopal episode.  Prior admission June through July for syncopal episode related to orthostatic hypotension from dysautonomia.   Patient was also found to have AKI on CKD creatinine of 2.2 compared to baseline of 1.7.  Assessment & Plan:   Principal Problem:   Syncope and collapse Active Problems:   Afib (HCC)   Hypotension, postural   MDD (major depressive disorder), recurrent severe, without psychosis ([place])   Parkinson's disease (HCC)   Resting tremor   Nicotine dependence  1-Syncope likely secondary to orthostatic hypotension from dysautonomia in the setting of Parkinson disease: -Pertinent of dehydration in the setting of AKI. -Received IV fluids. NSL.  -Continue  to monitor on telemetry -Continue with midodrine.  Started TED hose compression -PT eval. Needs SNF. -Abdominal Binder ordered. Explain to patient importance of using binder.  -Stable. Awaiting SNF  2-AKI on CKD stage IIIb; Prior cr 1.7--1.9 Continue with IV fluids for another 24 hours.  Cr down to 1.7  NSL.  Cr stable. 1.5 monitor.   3-A. fib: Not on Anticoagulation probably  related to frequent syncopal episode at increased risk for fall and bleeding  4-Parkinson disease: Continue with Sinemet 5-Depression: Continue with sertraline and risperidone 6-HLD; continue with fenofibrate and simvastatin 7-Tobacco Abuse: counseling.   Obesity  HTN; would allow high BP, due to high risk for hypotension and fall.    Estimated body mass index is 28.48 kg/m as calculated from the following:   Height as of 02/23/21: 6' (1.829 m).   Weight as of 03/11/21: 95.3 kg.   DVT prophylaxis: Lovenox Code Status: DNR Family Communication: Care discussed with patient. Sister over the phone during hospitalization.  Disposition Plan:  Status is: Observation  The patient remains OBS appropriate and will d/c before 2 midnights.  Dispo: The patient is from: independent living.               Anticipated d/c is to:  might need SNF              Patient currently is medically stable to d/c. Awaiting SNF   Difficult to place patient No        Consultants:  None  Procedures:  None  Antimicrobials:    Subjective: Sitting recliner, no new complaints.   Objective: Vitals:   03/28/21 2020 03/29/21 0000 03/29/21 0455 03/29/21 0734  BP: (!) 151/70 (!) 174/87 (!) 164/93 (!) 141/91  Pulse: 68 60 (!) 59   Resp: '19 15 17 15  '$ Temp: 98.4 F (36.9 C) 97.6 F (36.4 C) 98.5 F (36.9 C) 97.6 F (36.4 C)  TempSrc: Oral Oral Oral Oral  SpO2: 100% 96% 97% 99%    Intake/Output Summary (Last 24 hours) at 03/29/2021 1232 Last data filed at 03/29/2021 0900 Gross per 24 hour  Intake 360 ml  Output 550 ml  Net -190 ml    There were no vitals filed for this visit.  Examination:  General exam: NAD Respiratory system: CTA Cardiovascular system: S 1, S 2 RRR Gastrointestinal system: BS present, soft, nt Central nervous system: Alert Extremities: No edema   Data Reviewed: I have personally reviewed following labs and imaging studies  CBC: Recent Labs  Lab 03/24/21 1347 03/25/21 0154 03/26/21 0232  WBC 10.3 6.1 5.3  NEUTROABS 9.0*  --   --   HGB 10.7* 9.8* 9.7*  HCT 34.2* 31.7* 30.8*  MCV 92.4 93.2 92.5  PLT 162 153 140*    Basic Metabolic Panel: Recent Labs  Lab 03/24/21 1347 03/25/21 0154 03/26/21 0232  03/28/21 0229  NA 140 140 139 138  K 4.3 4.2 4.3 4.1  CL 111 111 110 105  CO2 '22 24 23 23  '$ GLUCOSE 130* 97 92 108*  BUN 46* 43* 33* 30*  CREATININE 2.24* 2.17* 1.70* 1.59*  CALCIUM 10.1 10.0 9.8 10.1    GFR: CrCl cannot be calculated (Unknown ideal weight.). Liver Function Tests: Recent Labs  Lab 03/24/21 1347  AST 22  ALT <5  ALKPHOS 59  BILITOT 0.6  PROT 6.4*  ALBUMIN 3.5    No results for input(s): LIPASE, AMYLASE in the last 168 hours. No results for input(s): AMMONIA in the last 168 hours. Coagulation Profile: No results for input(s): INR, PROTIME in the last 168 hours. Cardiac Enzymes: Recent Labs  Lab 03/24/21 1554  CKTOTAL 155    BNP (last 3 results) No results for input(s): PROBNP in the last 8760 hours. HbA1C: No results for input(s): HGBA1C in the last 72 hours. CBG: No results for input(s): GLUCAP in the last 168 hours. Lipid Profile: No results for input(s): CHOL, HDL, LDLCALC, TRIG, CHOLHDL, LDLDIRECT in the last 72 hours. Thyroid Function Tests: No results for input(s): TSH, T4TOTAL, FREET4, T3FREE, THYROIDAB in the last 72 hours. Anemia Panel: No results for input(s): VITAMINB12, FOLATE, FERRITIN, TIBC, IRON, RETICCTPCT in the last 72 hours. Sepsis Labs: No results for input(s): PROCALCITON, LATICACIDVEN in the last 168 hours.  Recent Results (from the past 240 hour(s))  Resp Panel by RT-PCR (Flu A&B, Covid) Nasopharyngeal Swab     Status: None   Collection Time: 03/24/21  2:18 PM   Specimen: Nasopharyngeal Swab; Nasopharyngeal(NP) swabs in vial transport medium  Result Value Ref Range Status   SARS Coronavirus 2 by RT PCR NEGATIVE NEGATIVE Final    Comment: (NOTE) SARS-CoV-2 target nucleic acids are NOT DETECTED.  The SARS-CoV-2 RNA is generally detectable in upper respiratory specimens during the acute phase of infection. The lowest concentration of SARS-CoV-2 viral copies this assay can detect is 138 copies/mL. A negative result does  not preclude SARS-Cov-2 infection and should not be used as the sole basis for treatment or other patient management decisions. A negative result may occur with  improper specimen collection/handling, submission of specimen other than nasopharyngeal swab, presence of viral mutation(s) within the areas targeted by this assay, and inadequate number of viral copies(<138 copies/mL). A negative result must be combined with clinical observations, patient history, and epidemiological information. The expected result is Negative.  Fact Sheet for Patients:  EntrepreneurPulse.com.au  Fact Sheet for Healthcare Providers:  IncredibleEmployment.be  This test is no t yet approved or cleared by the [place] FDA and  has been authorized for detection and/or diagnosis of SARS-CoV-2 by FDA under an Emergency Use Authorization (EUA). This EUA will remain  in effect (meaning this test can be used) for the duration of the COVID-19 declaration under Section 564(b)(1) of the Act, 21 U.S.C.section  360bbb-3(b)(1), unless the authorization is terminated  or revoked sooner.       Influenza A by PCR NEGATIVE NEGATIVE Final   Influenza B by PCR NEGATIVE NEGATIVE Final    Comment: (NOTE) The Xpert Xpress SARS-CoV-2/FLU/RSV plus assay is intended as an aid in the diagnosis of influenza from Nasopharyngeal swab specimens and should not be used as a sole basis for treatment. Nasal washings and aspirates are unacceptable for Xpert Xpress SARS-CoV-2/FLU/RSV testing.  Fact Sheet for Patients: EntrepreneurPulse.com.au  Fact Sheet for Healthcare Providers: IncredibleEmployment.be  This test is not yet approved or cleared by the [place] FDA and has been authorized for detection and/or diagnosis of SARS-CoV-2 by FDA under an Emergency Use Authorization (EUA). This EUA will remain in effect (meaning this test can be used) for the  duration of the COVID-19 declaration under Section 564(b)(1) of the Act, 21 U.S.C. section 360bbb-3(b)(1), unless the authorization is terminated or revoked.  Performed at [facility], [place] [address] [place], [place] [address]   SARS CORONAVIRUS 2 (TAT 6-24 HRS) Nasopharyngeal Nasopharyngeal Swab     Status: None   Collection Time: 03/28/21  1:42 PM   Specimen: Nasopharyngeal Swab  Result Value Ref Range Status   SARS Coronavirus 2 NEGATIVE NEGATIVE Final    Comment: (NOTE) SARS-CoV-2 target nucleic acids are NOT DETECTED.  The SARS-CoV-2 RNA is generally detectable in upper and lower respiratory specimens during the acute phase of infection. Negative results do not preclude SARS-CoV-2 infection, do not rule out co-infections with other pathogens, and should not be used as the sole basis for treatment or other patient management decisions. Negative results must be combined with clinical observations, patient history, and epidemiological information. The expected result is Negative.  Fact Sheet for Patients: SugarRoll.be  Fact Sheet for Healthcare Providers: https://www.woods-mathews.com/  This test is not yet approved or cleared by the [place] FDA and  has been authorized for detection and/or diagnosis of SARS-CoV-2 by FDA under an Emergency Use Authorization (EUA). This EUA will remain  in effect (meaning this test can be used) for the duration of the COVID-19 declaration under Se ction 564(b)(1) of the Act, 21 U.S.C. section 360bbb-3(b)(1), unless the authorization is terminated or revoked sooner.  Performed at [facility], [place] [address] [place], [place] [address]           Radiology Studies: No results found.      Scheduled Meds:  aspirin EC  81 mg Oral Daily   carbidopa-levodopa  1 tablet Oral TID   enoxaparin (LOVENOX) injection  40 mg Subcutaneous Q24H   fenofibrate  54 mg Oral Daily    midodrine  5 mg Oral BID WC   polyethylene glycol  17 g Oral BID   risperiDONE  0.5 mg Oral QHS   senna  1 tablet Oral BID   sertraline  100 mg Oral Daily   simvastatin  10 mg Oral q1800   vitamin B-12  1,000 mcg Oral Daily   Continuous Infusions:     LOS: 0 days    Time spent: 35 minutes.     Elmarie Shiley, MD [facility]   If 7PM-7AM, please contact night-coverage [email or web address]  03/29/2021, 12:32 PM

## 2021-03-29 NOTE — NC FL2 (Signed)
[place] LEVEL OF CARE SCREENING TOOL     IDENTIFICATION  Patient Name: Fernando Carr Birthdate: 01/14/1941 Sex: male Admission Date (Current Location): 03/24/2021  [facility] and [place] Number:  Herbalist and Address:  [facility] [place], [place] [address], [place], [place] [address]      Provider Number: [id number]  Attending Physician Name and Address:  Elmarie Shiley, MD  Relative Name and Phone Number:       Current Level of Care: Hospital Recommended Level of Care: [place] Prior Approval Number:    Date Approved/Denied:   PASRR Number: Under Review  Discharge Plan: SNF    Current Diagnoses: Patient Active Problem List   Diagnosis Date Noted   Syncope and collapse 03/24/2021   Parkinson's disease ([place])    Resting tremor    Nicotine dependence    Orthostatic hypotension 01/29/2021   Hip fracture ([place]) 08/13/2020   LVH (left ventricular hypertrophy) 11/14/2019   Educated about COVID-19 virus infection 11/14/2019   AKI (acute kidney injury) ([place])    Syncope 09/26/2019   MDD (major depressive disorder), recurrent severe, without psychosis ([place]) 08/20/2014   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   Severe depression ([place])    Major depressive disorder, recurrent, severe without psychotic features ([place])    AAA (abdominal aortic aneurysm) ([place]) 07/07/2014   Dehydration 03/10/2014   FTT (failure to thrive) in adult 03/10/2014   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   Sinus bradycardia 03/10/2014   HTN (hypertension) 03/10/2014   Afib ([place]) 03/09/2014   Hypotension, postural 03/09/2014   CRD (chronic renal disease), stage III ([place]) 01/09/2014   Abdominal aortic aneurysm ([place]) 12/20/2011   Bipolar 1 disorder ([place]) 10/24/2011   Hyperlipemia 10/10/2011   Hypertriglyceridemia 10/03/2011   Hyperlipidemia 03/24/2011    Orientation RESPIRATION BLADDER Height & Weight     Self, Time, Situation, Place  Normal  External catheter, Continent Weight:   Height:     BEHAVIORAL SYMPTOMS/MOOD NEUROLOGICAL BOWEL NUTRITION STATUS      Continent Diet (please see discahrge summary)  AMBULATORY STATUS COMMUNICATION OF NEEDS Skin   Supervision Verbally                         Personal Care Assistance Level of Assistance  Bathing, Feeding, Dressing Bathing Assistance: Limited assistance Feeding assistance: Independent Dressing Assistance: Limited assistance     Functional Limitations Info  Sight, Hearing, Speech Sight Info: Impaired (wears glasses) Hearing Info: Adequate Speech Info: Adequate    SPECIAL CARE FACTORS FREQUENCY  PT (By licensed PT), OT (By licensed OT)     PT Frequency: 5x per week OT Frequency: 5x per week            Contractures Contractures Info: Not present    Additional Factors Info  Code Status, Allergies, Psychotropic Code Status Info: DNR Allergies Info: NKA Psychotropic Info: sertraline (ZOLOFT) tablet 100 mg         Current Medications (03/29/2021):  This is the current hospital active medication list Current Facility-Administered Medications  Medication Dose Route Frequency Provider Last Rate Last Admin   acetaminophen (TYLENOL) tablet 650 mg  650 mg Oral Q6H PRN Agbata, Tochukwu, MD       Or   acetaminophen (TYLENOL) suppository 650 mg  650 mg Rectal Q6H PRN Agbata, Tochukwu, MD       aspirin EC tablet 81 mg  81 mg Oral Daily Agbata, Tochukwu, MD  81 mg at 03/29/21 [id number]   carbidopa-levodopa (SINEMET IR) 25-100 MG per tablet immediate release 1 tablet  1 tablet Oral TID Agbata, Tochukwu, MD   1 tablet at 03/29/21 0828   enoxaparin (LOVENOX) injection 40 mg  40 mg Subcutaneous Q24H Agbata, Tochukwu, MD   40 mg at 03/28/21 2123   fenofibrate tablet 54 mg  54 mg Oral Daily Agbata, Tochukwu, MD   54 mg at 03/29/21 0828   midodrine (PROAMATINE) tablet 5 mg  5 mg Oral BID WC Regalado, Belkys A, MD   5 mg at 03/29/21 0828   ondansetron (ZOFRAN) tablet 4 mg  4  mg Oral Q6H PRN Agbata, Tochukwu, MD       Or   ondansetron (ZOFRAN) injection 4 mg  4 mg Intravenous Q6H PRN Agbata, Tochukwu, MD       polyethylene glycol (MIRALAX / GLYCOLAX) packet 17 g  17 g Oral BID Regalado, Belkys A, MD   17 g at 03/29/21 1222   risperiDONE (RISPERDAL) tablet 0.5 mg  0.5 mg Oral QHS Agbata, Tochukwu, MD   0.5 mg at 03/28/21 2123   senna (SENOKOT) tablet 8.6 mg  1 tablet Oral BID Regalado, Belkys A, MD   8.6 mg at 03/29/21 1221   sertraline (ZOLOFT) tablet 100 mg  100 mg Oral Daily Agbata, Tochukwu, MD   100 mg at 03/29/21 [id number]   simvastatin (ZOCOR) tablet 10 mg  10 mg Oral q1800 Agbata, Tochukwu, MD   10 mg at 03/28/21 1709   vitamin B-12 (CYANOCOBALAMIN) tablet 1,000 mcg  1,000 mcg Oral Daily Agbata, Tochukwu, MD   1,000 mcg at 03/29/21 [id number]     Discharge Medications: Please see discharge summary for a list of discharge medications.  Relevant Imaging Results:  Relevant Lab Results:   Additional Information SSN [id number]  [place] COVID-19 Vaccine 10/15/2019 , 09/09/2019  Vinie Sill, LCSW

## 2021-03-29 NOTE — TOC Progression Note (Addendum)
Transition of Care [facility]) - Progression Note    Patient Details  Name: Fernando Carr MRN: [id number] Date of Birth: 02-20-1941  Transition of Care [facility]) CM/SW [place], [place] Phone Number: 03/29/2021, 4:00 PM  Clinical Narrative:     11:26 am -  CSW received up- per MD patient is medically stable for discharge. Per CSW Weekend CSW Lattie Haw, patient's friend and paid Case Manager [name] was provided with bed offers over the weekend. No facility was selected at that time.  and she expressed  to [place] she was going to follow up with [facility] and [facility] the following day(Monday).  1:02 pm-CSW sent message to [facility]- requested they review  3:29 pm- Laurell Josephs confirmed they no availability. 1:20 pm- CSW spoke with Claiborne Billings at [facility]- they declined patient, indicating behaviors as placement barrier.  3:58 pm- CSW called [facility], Left voice message to return patient is medically stable for d/c to return call - need SNF choice.  4:20 pm- received call back from [facility]- provided updated and current bed offers. She states she will review and call CSW back  4:31 pm -Anderson Malta states she was going to call [facility] requested CSW follow up with [facility]. CSW contacted [facility]- waiting on response.  4:42 pm- Anderson Malta requested to send referrals to [facility], [facility] Flock, [place].  5:00 pm- [facility] offered and confirmed bed offer- SNF will start insurance auth - CSW informed [facility] Malta, patient friend and advocate , of bed other, she accepted.    Authorization must be started by SNF. Authorization could not be started until patient had secured bed at SNF. Including, patient's psarr remains pending- CSW will update.   CSW will continue to follow and assist with discharge planning.   Thurmond Butts, MSW, LCSW Clinical Social Worker      Expected Discharge Plan: Skilled Nursing Facility Barriers to Discharge: Awaiting State  Approval (PASRR), Insurance Authorization, Continued Medical Work up, SNF Pending bed offer  Expected Discharge Plan and Services Expected Discharge Plan: [place] In-house Referral: Clinical Social Work     Living arrangements for the past 2 months: [place]                                       Social Determinants of Health (SDOH) Interventions    Readmission Risk Interventions No flowsheet data found.

## 2021-03-30 DIAGNOSIS — R55 Syncope and collapse: Secondary | ICD-10-CM | POA: Diagnosis not present

## 2021-03-30 LAB — BASIC METABOLIC PANEL
Anion gap: 4 — ABNORMAL LOW (ref 5–15)
BUN: 33 mg/dL — ABNORMAL HIGH (ref 8–23)
CO2: 27 mmol/L (ref 22–32)
Calcium: 10.2 mg/dL (ref 8.9–10.3)
Chloride: 106 mmol/L (ref 98–111)
Creatinine, Ser: 1.42 mg/dL — ABNORMAL HIGH (ref 0.61–1.24)
GFR, Estimated: 50 mL/min — ABNORMAL LOW (ref >60)
Glucose, Bld: 104 mg/dL — ABNORMAL HIGH (ref 70–99)
Potassium: 4.2 mmol/L (ref 3.5–5.1)
Sodium: 137 mmol/L (ref 135–145)

## 2021-03-30 LAB — SARS CORONAVIRUS 2 (TAT 6-24 HRS): SARS Coronavirus 2: NEGATIVE

## 2021-03-30 MED ORDER — MIDODRINE HCL 5 MG PO TABS
5.0000 mg | ORAL_TABLET | Freq: Three times a day (TID) | ORAL | Status: DC
  Administered 2021-03-30 – 2021-03-31 (×4): 5 mg via ORAL
  Filled 2021-03-30 (×4): qty 1

## 2021-03-30 NOTE — Progress Notes (Signed)
PROGRESS NOTE    Fernando Carr  [id number] DOB: 08/17/1940 DOA: 03/24/2021 PCP: [name], MD   Brief Narrative: 80 year old with past medical history significant for Parkinson's disease, dysautonomia from Parkinson's disease, frequent syncopal episode, CKD stage IIIb, A. fib on Eliquis, AAA who presents to the ED via EMS for evaluation of 3 syncopal episode.  Patient was found on the ground with abrasions of both forearms.  He was also noted to be hypotensive.  He had a witnessed syncopal episode by his caregiver who said that he has some jerking movement associated with his syncopal episode.  Prior admission June through July for syncopal episode related to orthostatic hypotension from dysautonomia.   Patient was also found to have AKI on CKD creatinine of 2.2 compared to baseline of 1.7.  Assessment & Plan:   Principal Problem:   Syncope and collapse Active Problems:   Afib (HCC)   Hypotension, postural   MDD (major depressive disorder), recurrent severe, without psychosis ([place])   Parkinson's disease (HCC)   Resting tremor   Nicotine dependence  1-Syncope likely secondary to orthostatic hypotension from dysautonomia in the setting of Parkinson disease: -Pertinent of dehydration in the setting of AKI. -Received IV fluids. NSL.  -Continue  to monitor on telemetry -Continue with midodrine.  Started TED hose compression -PT eval. Needs SNF. -Abdominal Binder ordered. Explain to patient importance of using binder.  -stable, awaiting SNF -Plan to change Midodrine to TID from BID.   2-AKI on CKD stage IIIb; Prior cr 1.7--1.9 Continue with IV fluids for another 24 hours.  Cr down to 1.7  NSL.  Cr stable. 1.4 monitor.   3-A. fib: Not on Anticoagulation probably  related to frequent syncopal episode at increased risk for fall and bleeding  4-Parkinson disease: Continue with Sinemet 5-Depression: Continue with sertraline and risperidone 6-HLD; continue with fenofibrate  and simvastatin 7-Tobacco Abuse: counseling.  Obesity  HTN; would allow high BP, due to high risk for hypotension and fall.    Estimated body mass index is 28.79 kg/m as calculated from the following:   Height as of this encounter: 6' (1.829 m).   Weight as of this encounter: 96.3 kg.   DVT prophylaxis: Lovenox Code Status: DNR Family Communication: Care discussed with patient. Sister over the phone during hospitalization.  Disposition Plan:  Status is: Observation  The patient remains OBS appropriate and will d/c before 2 midnights.  Dispo: The patient is from: independent living.               Anticipated d/c is to:  might need SNF              Patient currently is medically stable to d/c. Awaiting SNF   Difficult to place patient No        Consultants:  None  Procedures:  None  Antimicrobials:    Subjective: He was able to stand up with ot, BP drop to 90. He was able to to ambulate in the room with a walker and with the assistance of OT.  Objective: Vitals:   03/30/21 0717 03/30/21 0903 03/30/21 1200 03/30/21 1221  BP: (!) 160/71 (!) 158/76 116/60 92/78  Pulse: 61 60 66 62  Resp: '16 16 15   '$ Temp: 97.8 F (36.6 C) 97.6 F (36.4 C) (!) 97.5 F (36.4 C) (!) 97.4 F (36.3 C)  TempSrc: Oral Oral Oral Oral  SpO2: 96% 98% 97% 98%  Weight:      Height:  Intake/Output Summary (Last 24 hours) at 03/30/2021 1451 Last data filed at 03/30/2021 1245 Gross per 24 hour  Intake 880 ml  Output --  Net 880 ml    Filed Weights   03/30/21 0200  Weight: 96.3 kg    Examination:  General exam: NAD Respiratory system: CTA Cardiovascular system: S 1, S 2 RRR Gastrointestinal system: BS present, soft, nt Central nervous system: Alert Extremities: no edema   Data Reviewed: I have personally reviewed following labs and imaging studies  CBC: Recent Labs  Lab 03/24/21 1347 03/25/21 0154 03/26/21 0232  WBC 10.3 6.1 5.3  NEUTROABS 9.0*  --   --    HGB 10.7* 9.8* 9.7*  HCT 34.2* 31.7* 30.8*  MCV 92.4 93.2 92.5  PLT 162 153 140*    Basic Metabolic Panel: Recent Labs  Lab 03/24/21 1347 03/25/21 0154 03/26/21 0232 03/28/21 0229 03/30/21 0050  NA 140 140 139 138 137  K 4.3 4.2 4.3 4.1 4.2  CL 111 111 110 105 106  CO2 '22 24 23 23 27  '$ GLUCOSE 130* 97 92 108* 104*  BUN 46* 43* 33* 30* 33*  CREATININE 2.24* 2.17* 1.70* 1.59* 1.42*  CALCIUM 10.1 10.0 9.8 10.1 10.2    GFR: Estimated Creatinine Clearance: 49.9 mL/min (A) (by C-G formula based on SCr of 1.42 mg/dL (H)). Liver Function Tests: Recent Labs  Lab 03/24/21 1347  AST 22  ALT <5  ALKPHOS 59  BILITOT 0.6  PROT 6.4*  ALBUMIN 3.5    No results for input(s): LIPASE, AMYLASE in the last 168 hours. No results for input(s): AMMONIA in the last 168 hours. Coagulation Profile: No results for input(s): INR, PROTIME in the last 168 hours. Cardiac Enzymes: Recent Labs  Lab 03/24/21 1554  CKTOTAL 155    BNP (last 3 results) No results for input(s): PROBNP in the last 8760 hours. HbA1C: No results for input(s): HGBA1C in the last 72 hours. CBG: No results for input(s): GLUCAP in the last 168 hours. Lipid Profile: No results for input(s): CHOL, HDL, LDLCALC, TRIG, CHOLHDL, LDLDIRECT in the last 72 hours. Thyroid Function Tests: No results for input(s): TSH, T4TOTAL, FREET4, T3FREE, THYROIDAB in the last 72 hours. Anemia Panel: No results for input(s): VITAMINB12, FOLATE, FERRITIN, TIBC, IRON, RETICCTPCT in the last 72 hours. Sepsis Labs: No results for input(s): PROCALCITON, LATICACIDVEN in the last 168 hours.  Recent Results (from the past 240 hour(s))  Resp Panel by RT-PCR (Flu A&B, Covid) Nasopharyngeal Swab     Status: None   Collection Time: 03/24/21  2:18 PM   Specimen: Nasopharyngeal Swab; Nasopharyngeal(NP) swabs in vial transport medium  Result Value Ref Range Status   SARS Coronavirus 2 by RT PCR NEGATIVE NEGATIVE Final    Comment:  (NOTE) SARS-CoV-2 target nucleic acids are NOT DETECTED.  The SARS-CoV-2 RNA is generally detectable in upper respiratory specimens during the acute phase of infection. The lowest concentration of SARS-CoV-2 viral copies this assay can detect is 138 copies/mL. A negative result does not preclude SARS-Cov-2 infection and should not be used as the sole basis for treatment or other patient management decisions. A negative result may occur with  improper specimen collection/handling, submission of specimen other than nasopharyngeal swab, presence of viral mutation(s) within the areas targeted by this assay, and inadequate number of viral copies(<138 copies/mL). A negative result must be combined with clinical observations, patient history, and epidemiological information. The expected result is Negative.  Fact Sheet for Patients:  EntrepreneurPulse.com.au  Fact Sheet for  Healthcare Providers:  IncredibleEmployment.be  This test is no t yet approved or cleared by the [place] and  has been authorized for detection and/or diagnosis of SARS-CoV-2 by FDA under an Emergency Use Authorization (EUA). This EUA will remain  in effect (meaning this test can be used) for the duration of the COVID-19 declaration under Section 564(b)(1) of the Act, 21 U.S.C.section 360bbb-3(b)(1), unless the authorization is terminated  or revoked sooner.       Influenza A by PCR NEGATIVE NEGATIVE Final   Influenza B by PCR NEGATIVE NEGATIVE Final    Comment: (NOTE) The Xpert Xpress SARS-CoV-2/FLU/RSV plus assay is intended as an aid in the diagnosis of influenza from Nasopharyngeal swab specimens and should not be used as a sole basis for treatment. Nasal washings and aspirates are unacceptable for Xpert Xpress SARS-CoV-2/FLU/RSV testing.  Fact Sheet for Patients: EntrepreneurPulse.com.au  Fact Sheet for Healthcare  Providers: IncredibleEmployment.be  This test is not yet approved or cleared by the [place] FDA and has been authorized for detection and/or diagnosis of SARS-CoV-2 by FDA under an Emergency Use Authorization (EUA). This EUA will remain in effect (meaning this test can be used) for the duration of the COVID-19 declaration under Section 564(b)(1) of the Act, 21 U.S.C. section 360bbb-3(b)(1), unless the authorization is terminated or revoked.  Performed at [facility], [place] [address] [place], [place] [address]   SARS CORONAVIRUS 2 (TAT 6-24 HRS) Nasopharyngeal Nasopharyngeal Swab     Status: None   Collection Time: 03/28/21  1:42 PM   Specimen: Nasopharyngeal Swab  Result Value Ref Range Status   SARS Coronavirus 2 NEGATIVE NEGATIVE Final    Comment: (NOTE) SARS-CoV-2 target nucleic acids are NOT DETECTED.  The SARS-CoV-2 RNA is generally detectable in upper and lower respiratory specimens during the acute phase of infection. Negative results do not preclude SARS-CoV-2 infection, do not rule out co-infections with other pathogens, and should not be used as the sole basis for treatment or other patient management decisions. Negative results must be combined with clinical observations, patient history, and epidemiological information. The expected result is Negative.  Fact Sheet for Patients: SugarRoll.be  Fact Sheet for Healthcare Providers: https://www.woods-mathews.com/  This test is not yet approved or cleared by the [place] FDA and  has been authorized for detection and/or diagnosis of SARS-CoV-2 by FDA under an Emergency Use Authorization (EUA). This EUA will remain  in effect (meaning this test can be used) for the duration of the COVID-19 declaration under Se ction 564(b)(1) of the Act, 21 U.S.C. section 360bbb-3(b)(1), unless the authorization is terminated or revoked sooner.  Performed at  [facility], [place] [address] [place], [place] [address]           Radiology Studies: No results found.      Scheduled Meds:  aspirin EC  81 mg Oral Daily   carbidopa-levodopa  1 tablet Oral TID   enoxaparin (LOVENOX) injection  40 mg Subcutaneous Q24H   fenofibrate  54 mg Oral Daily   midodrine  5 mg Oral TID WC   polyethylene glycol  17 g Oral BID   risperiDONE  0.5 mg Oral QHS   senna  1 tablet Oral BID   sertraline  100 mg Oral Daily   simvastatin  10 mg Oral q1800   vitamin B-12  1,000 mcg Oral Daily   Continuous Infusions:     LOS: 0 days    Time spent: 35 minutes.  Elmarie Shiley, MD [facility]   If 7PM-7AM, please contact night-coverage [email or web address]  03/30/2021, 2:51 PM

## 2021-03-30 NOTE — Progress Notes (Signed)
Occupational Therapy Treatment Patient Details Name: Fernando Carr MRN: [id number] DOB: 1941-02-19 Today's Date: 03/30/2021    History of present illness 80 yo male presenting 8/24 after x3 syncopal episodes. Of note, pt with recent admission in July of 2022 for similar syncopal episode with hypotension. Upon workup, pt also found to have AKI on CKD IIIb. PMH includes: Parkinson's disease, dysautonomia from Parkinson's disease, frequent syncopal episode, CKD stage IIIb, A. fib on Eliquis, and AAA.   OT comments  Patient progressing slowly due to continued drop in BP when standing, but remains motivated and willing to work with therapy.  Pt able to work on in-room ambulation with RW, but dropped in BP to 78/53 despite abdominal binder, TEDs and rigorous bicep curls in sitting prior to stand and marching in place in attempts to regulate BP.  MD present for initial standing BP of 99/59 and stated plan to increase midodrine frequency to TID.  Patient remains limited by labile BP as above, generalized weakness and decreased activity tolerance along with deficits noted below. Pt continues to demonstrate good rehab potential and would benefit from continued skilled OT to increase safety and independence with ADLs and functional transfers to allow pt to return home safely and reduce caregiver burden and fall risk.   Follow Up Recommendations  SNF ([facility] and [facility] if pt refuses SNF)    Equipment Recommendations  None recommended by OT    Recommendations for Other Services      Precautions / Restrictions Precautions Precautions: Fall;Other (comment) Precaution Comments: watch BP, put on compression hose, and abdominal binder Restrictions Weight Bearing Restrictions: No       Mobility Bed Mobility Overal bed mobility: Needs Assistance Bed Mobility: Supine to Sit     Supine to sit: Min guard;HOB elevated     General bed mobility comments: Pt up in recliner    Transfers Overall transfer  level: Needs assistance Equipment used: Rolling walker (2 wheeled) Transfers: Sit to/from Stand Sit to Stand: Min assist         General transfer comment: Min Assist for power up. Min As for controlled descent to chair.    Balance Overall balance assessment: Needs assistance Sitting-balance support: No upper extremity supported;Feet unsupported Sitting balance-Leahy Scale: Fair     Standing balance support: Bilateral upper extremity supported;During functional activity Standing balance-Leahy Scale: Poor Standing balance comment: Pt used RW and performed marching in place and 5' anterior and 5' backwards ambulation with Min As.                           ADL either performed or assessed with clinical judgement   ADL                                               Vision Baseline Vision/History: 1 Wears glasses Patient Visual Report: No change from baseline Vision Assessment?: No apparent visual deficits   Perception     Praxis      Cognition Arousal/Alertness: Awake/alert Behavior During Therapy: WFL for tasks assessed/performed Overall Cognitive Status: Within Functional Limits for tasks assessed                                 General Comments: Likes to go by his middle  name of "Fernando Carr"        Exercises Other Exercises Other Exercises: Brisk Bil bicep curls while in chair to ~20 reps in attempts to bring BP up with good recovery to 101/60 Other Exercises: Handout and discussion/education on energy conservation including use of adaptiev equipment and DME.   Shoulder Instructions       General Comments      Pertinent Vitals/ Pain       Pain Assessment: No/denies pain  Home Living                                          Prior Functioning/Environment              Frequency  Min 2X/week        Progress Toward Goals  OT Goals(current goals can now be found in the care plan section)   Progress towards OT goals: Not progressing toward goals - comment (Due to BP drop in standing.)  Acute Rehab OT Goals Patient Stated Goal: return home OT Goal Formulation: With patient Time For Goal Achievement: 04/09/21 Potential to Achieve Goals: Good  Plan Discharge plan remains appropriate    Co-evaluation                 AM-PAC OT "6 Clicks" Daily Activity     Outcome Measure   Help from another person eating meals?: None Help from another person taking care of personal grooming?: A Little Help from another person toileting, which includes using toliet, bedpan, or urinal?: A Little Help from another person bathing (including washing, rinsing, drying)?: A Little Help from another person to put on and taking off regular upper body clothing?: A Little Help from another person to put on and taking off regular lower body clothing?: A Little 6 Click Score: 19    End of Session Equipment Utilized During Treatment: Gait belt;Rolling walker  OT Visit Diagnosis: Unsteadiness on feet (R26.81);Other abnormalities of gait and mobility (R26.89);Muscle weakness (generalized) (M62.81)   Activity Tolerance Patient tolerated treatment well   Patient Left in chair;with call bell/phone within reach (Did not set chair alarm as it would not stop alarming once activated. Pt did verbalize understanding to use call bell and wait for staff prior to mobilizing.)   Nurse Communication Other (comment) (MD in to observe pt standing BP. MD requested continued use of abd binder and TEDs which were in place.)        Time: 1158-1222 OT Time Calculation (min): 24 min  Charges: OT General Charges $OT Visit: 1 Visit OT Treatments $Therapeutic Activity: 23-37 mins  Anderson Malta, OT Acute Rehab Services Office: [phone] 03/30/2021   Julien Girt 03/30/2021, 1:23 PM

## 2021-03-30 NOTE — TOC Progression Note (Signed)
Transition of Care [facility]) - Progression Note    Patient Details  Name: Fernando Carr MRN: [id number] Date of Birth: 07-02-1941  Transition of Care [facility]) CM/SW [place], [place] Phone Number: 03/30/2021, 12:38 PM  Clinical Narrative:     Received PSARR # [id number] E Valid  thru 03/30/2021 - 04/29/2021  Thurmond Butts, MSW, LCSW Clinical Social Worker    Expected Discharge Plan: Skilled Nursing Facility Barriers to Discharge: [place] (PASRR), Insurance Authorization, Continued Medical Work up, SNF Pending bed offer  Expected Discharge Plan and Services Expected Discharge Plan: [place] In-house Referral: Clinical Social Work     Living arrangements for the past 2 months: [place]                                       Social Determinants of Health (SDOH) Interventions    Readmission Risk Interventions No flowsheet data found.

## 2021-03-30 NOTE — Progress Notes (Addendum)
Physical Therapy Treatment Patient Details Name: Fernando Carr MRN: [id number] DOB: 1941/04/14 Today's Date: 03/30/2021    History of Present Illness 80 yo male presenting 8/24 after x3 syncopal episodes. Of note, pt with recent admission in July of 2022 for similar syncopal episode with hypotension. Upon workup, pt also found to have AKI on CKD IIIb. PMH includes: Parkinson's disease, dysautonomia from Parkinson's disease, frequent syncopal episode, CKD stage IIIb, A. fib on Eliquis, and AAA.    PT Comments    Pt making steady progress with mobility. Continue to recommend SNF for further rehab. Pt with knee high compression hose and abdominal binder on. BP 163/75 in sitting. No lightheadedness with amb. BP after amb reading 160/120 but pt with tremors in UE's due to Parkinson's and doubt that pressure is accurate.   Follow Up Recommendations  SNF     Equipment Recommendations  None recommended by PT    Recommendations for Other Services       Precautions / Restrictions Precautions Precautions: Fall;Other (comment) Precaution Comments: watch BP, put on compression hose, and abdominal binder Restrictions Weight Bearing Restrictions: No    Mobility  Bed Mobility Overal bed mobility: Needs Assistance Bed Mobility: Supine to Sit     Supine to sit: Min guard;HOB elevated     General bed mobility comments: Incr time and effort. Assist for safety    Transfers Overall transfer level: Needs assistance Equipment used: 4-wheeled walker Transfers: Sit to/from Stand Sit to Stand: Min assist         General transfer comment: Assist to bring hips up and for balance. Verbal cues for hand placement  Ambulation/Gait Ambulation/Gait assistance: Min assist;+2 safety/equipment Gait Distance (Feet): 140 Feet (x 2) Assistive device: 4-wheeled walker Gait Pattern/deviations: Step-through pattern;Decreased step length - left;Decreased step length - right;Shuffle;Festinating;Trunk  flexed Gait velocity: At times too fast for abilities Gait velocity interpretation: 1.31 - 2.62 ft/sec, indicative of limited community ambulator General Gait Details: Assist for balance and support and to control speed. Verbal cues to stand more erect, take big steps.   Stairs             Wheelchair Mobility    Modified Rankin (Stroke Patients Only)       Balance Overall balance assessment: Needs assistance Sitting-balance support: No upper extremity supported;Feet unsupported Sitting balance-Leahy Scale: Fair     Standing balance support: Bilateral upper extremity supported;During functional activity Standing balance-Leahy Scale: Poor Standing balance comment: rollator and min guard for static standing                            Cognition Arousal/Alertness: Awake/alert Behavior During Therapy: WFL for tasks assessed/performed Overall Cognitive Status: Within Functional Limits for tasks assessed                                        Exercises      General Comments        Pertinent Vitals/Pain Pain Assessment: No/denies pain    Home Living                      Prior Function            PT Goals (current goals can now be found in the care plan section) Acute Rehab PT Goals Patient Stated Goal: return home Progress towards PT goals: Progressing  toward goals    Frequency    Min 2X/week      PT Plan Current plan remains appropriate;Frequency needs to be updated    Co-evaluation              AM-PAC PT "6 Clicks" Mobility   Outcome Measure  Help needed turning from your back to your side while in a flat bed without using bedrails?: A Little Help needed moving from lying on your back to sitting on the side of a flat bed without using bedrails?: A Little Help needed moving to and from a bed to a chair (including a wheelchair)?: A Little Help needed standing up from a chair using your arms (e.g.,  wheelchair or bedside chair)?: A Little Help needed to walk in hospital room?: A Little Help needed climbing 3-5 steps with a railing? : Total 6 Click Score: 16    End of Session Equipment Utilized During Treatment: Gait belt Activity Tolerance: Patient tolerated treatment well Patient left: in chair;with call bell/phone within reach;with chair alarm set Nurse Communication: Mobility status PT Visit Diagnosis: Other abnormalities of gait and mobility (R26.89);Muscle weakness (generalized) (M62.81);Repeated falls (R29.6)     Time: [id number] PT Time Calculation (min) (ACUTE ONLY): 25 min  Charges:  $Gait Training: 23-37 mins                     [place] Pager [phone] Office [place] 03/30/2021, 1:09 PM

## 2021-03-31 DIAGNOSIS — I951 Orthostatic hypotension: Secondary | ICD-10-CM | POA: Diagnosis present

## 2021-03-31 DIAGNOSIS — E669 Obesity, unspecified: Secondary | ICD-10-CM | POA: Diagnosis present

## 2021-03-31 DIAGNOSIS — Z48812 Encounter for surgical aftercare following surgery on the circulatory system: Secondary | ICD-10-CM | POA: Diagnosis not present

## 2021-03-31 DIAGNOSIS — R296 Repeated falls: Secondary | ICD-10-CM | POA: Diagnosis present

## 2021-03-31 DIAGNOSIS — F332 Major depressive disorder, recurrent severe without psychotic features: Secondary | ICD-10-CM | POA: Diagnosis not present

## 2021-03-31 DIAGNOSIS — Z6828 Body mass index (BMI) 28.0-28.9, adult: Secondary | ICD-10-CM | POA: Diagnosis not present

## 2021-03-31 DIAGNOSIS — Z9151 Personal history of suicidal behavior: Secondary | ICD-10-CM | POA: Diagnosis not present

## 2021-03-31 DIAGNOSIS — W1830XA Fall on same level, unspecified, initial encounter: Secondary | ICD-10-CM | POA: Diagnosis present

## 2021-03-31 DIAGNOSIS — R279 Unspecified lack of coordination: Secondary | ICD-10-CM | POA: Diagnosis not present

## 2021-03-31 DIAGNOSIS — E7800 Pure hypercholesterolemia, unspecified: Secondary | ICD-10-CM | POA: Diagnosis present

## 2021-03-31 DIAGNOSIS — R2681 Unsteadiness on feet: Secondary | ICD-10-CM | POA: Diagnosis not present

## 2021-03-31 DIAGNOSIS — E782 Mixed hyperlipidemia: Secondary | ICD-10-CM | POA: Diagnosis not present

## 2021-03-31 DIAGNOSIS — Z20818 Contact with and (suspected) exposure to other bacterial communicable diseases: Secondary | ICD-10-CM | POA: Diagnosis not present

## 2021-03-31 DIAGNOSIS — F32A Depression, unspecified: Secondary | ICD-10-CM | POA: Diagnosis not present

## 2021-03-31 DIAGNOSIS — Z7901 Long term (current) use of anticoagulants: Secondary | ICD-10-CM | POA: Diagnosis not present

## 2021-03-31 DIAGNOSIS — F0280 Dementia in other diseases classified elsewhere without behavioral disturbance: Secondary | ICD-10-CM | POA: Diagnosis present

## 2021-03-31 DIAGNOSIS — R627 Adult failure to thrive: Secondary | ICD-10-CM | POA: Diagnosis present

## 2021-03-31 DIAGNOSIS — F319 Bipolar disorder, unspecified: Secondary | ICD-10-CM | POA: Diagnosis present

## 2021-03-31 DIAGNOSIS — Z20822 Contact with and (suspected) exposure to covid-19: Secondary | ICD-10-CM | POA: Diagnosis present

## 2021-03-31 DIAGNOSIS — R55 Syncope and collapse: Secondary | ICD-10-CM | POA: Diagnosis present

## 2021-03-31 DIAGNOSIS — M6281 Muscle weakness (generalized): Secondary | ICD-10-CM | POA: Diagnosis not present

## 2021-03-31 DIAGNOSIS — R262 Difficulty in walking, not elsewhere classified: Secondary | ICD-10-CM | POA: Diagnosis not present

## 2021-03-31 DIAGNOSIS — S50812A Abrasion of left forearm, initial encounter: Secondary | ICD-10-CM | POA: Diagnosis present

## 2021-03-31 DIAGNOSIS — R1312 Dysphagia, oropharyngeal phase: Secondary | ICD-10-CM | POA: Diagnosis not present

## 2021-03-31 DIAGNOSIS — R001 Bradycardia, unspecified: Secondary | ICD-10-CM | POA: Diagnosis not present

## 2021-03-31 DIAGNOSIS — R531 Weakness: Secondary | ICD-10-CM | POA: Diagnosis not present

## 2021-03-31 DIAGNOSIS — Z716 Tobacco abuse counseling: Secondary | ICD-10-CM | POA: Diagnosis not present

## 2021-03-31 DIAGNOSIS — E860 Dehydration: Secondary | ICD-10-CM | POA: Diagnosis present

## 2021-03-31 DIAGNOSIS — I480 Paroxysmal atrial fibrillation: Secondary | ICD-10-CM | POA: Diagnosis present

## 2021-03-31 DIAGNOSIS — G20 Parkinson's disease: Secondary | ICD-10-CM | POA: Diagnosis present

## 2021-03-31 DIAGNOSIS — Z7401 Bed confinement status: Secondary | ICD-10-CM | POA: Diagnosis not present

## 2021-03-31 DIAGNOSIS — Z66 Do not resuscitate: Secondary | ICD-10-CM | POA: Diagnosis present

## 2021-03-31 DIAGNOSIS — R0902 Hypoxemia: Secondary | ICD-10-CM | POA: Diagnosis present

## 2021-03-31 DIAGNOSIS — Z818 Family history of other mental and behavioral disorders: Secondary | ICD-10-CM | POA: Diagnosis not present

## 2021-03-31 DIAGNOSIS — N1832 Chronic kidney disease, stage 3b: Secondary | ICD-10-CM | POA: Diagnosis present

## 2021-03-31 DIAGNOSIS — F17210 Nicotine dependence, cigarettes, uncomplicated: Secondary | ICD-10-CM | POA: Diagnosis present

## 2021-03-31 DIAGNOSIS — I129 Hypertensive chronic kidney disease with stage 1 through stage 4 chronic kidney disease, or unspecified chronic kidney disease: Secondary | ICD-10-CM | POA: Diagnosis present

## 2021-03-31 DIAGNOSIS — N179 Acute kidney failure, unspecified: Secondary | ICD-10-CM | POA: Diagnosis present

## 2021-03-31 DIAGNOSIS — S50811A Abrasion of right forearm, initial encounter: Secondary | ICD-10-CM | POA: Diagnosis present

## 2021-03-31 DIAGNOSIS — I10 Essential (primary) hypertension: Secondary | ICD-10-CM | POA: Diagnosis not present

## 2021-03-31 DIAGNOSIS — J069 Acute upper respiratory infection, unspecified: Secondary | ICD-10-CM | POA: Diagnosis not present

## 2021-03-31 LAB — CREATININE, SERUM
Creatinine, Ser: 1.5 mg/dL — ABNORMAL HIGH (ref 0.61–1.24)
GFR, Estimated: 47 mL/min — ABNORMAL LOW (ref >60)

## 2021-03-31 MED ORDER — MIDODRINE HCL 5 MG PO TABS: 5.0000 mg | ORAL_TABLET | Freq: Three times a day (TID) | ORAL | Status: DC

## 2021-03-31 MED ORDER — ASPIRIN 81 MG PO TBEC: 81.0000 mg | DELAYED_RELEASE_TABLET | Freq: Every day | ORAL | 11 refills | Status: DC

## 2021-03-31 MED ORDER — SENNA 8.6 MG PO TABS: 1.0000 | ORAL_TABLET | Freq: Two times a day (BID) | ORAL | 0 refills | Status: DC

## 2021-03-31 NOTE — Progress Notes (Signed)
Pt discharged to [facility] via [facility]. Pt left with all of his belongings. Attempted report to [facility], with no response. Nurse will call back, per the secretary.

## 2021-03-31 NOTE — TOC Progression Note (Signed)
Transition of Care [facility]) - Progression Note    Patient Details  Name: HOWIE BETTON MRN: [id number] Date of Birth: September 13, 1940  Transition of Care [facility]) CM/SW [place], [place] Phone Number: 03/31/2021, 10:09 AM  Clinical Narrative:     CSW sent message to [facility]- requested updated on insurance auth status- waiting on response.  Thurmond Butts, MSW, LCSW Clinical Social Worker    Expected Discharge Plan: Skilled Nursing Facility Barriers to Discharge: [place] State Approval (PASRR), Insurance Authorization, Continued Medical Work up, SNF Pending bed offer  Expected Discharge Plan and Services Expected Discharge Plan: [place] In-house Referral: Clinical Social Work     Living arrangements for the past 2 months: [place]                                       Social Determinants of Health (SDOH) Interventions    Readmission Risk Interventions No flowsheet data found.

## 2021-03-31 NOTE — TOC Transition Note (Signed)
Transition of Care [facility]) - CM/SW Discharge Note   Patient Details  Name: Fernando Carr MRN: [id number] Date of Birth: Oct 12, 1940  Transition of Care [facility]) CM/SW Contact:  Vinie Sill, LCSW Phone Number: 03/31/2021, 2:07 PM   Clinical Narrative:     Patient will Discharge to: [facility]  Discharge Date: 03/31/2021 Family Notified: Burley Saver- Case Manager- left voice message  Transport By: Corey Harold  Per MD patient is ready for discharge. RN, patient, and facility notified of discharge. Discharge Summary sent to facility. RN given number for [phone]. Ambulance transport requested for patient.   Clinical Social Worker signing off.  Thurmond Butts, MSW, LCSW Clinical Social Worker    Final next level of care: Skilled Nursing Facility Barriers to Discharge: Barriers Resolved   Patient Goals and CMS Choice        Discharge Placement              Patient chooses bed at: [facility] Patient to be transferred to facility by: [facility] Name of family member notified: [name] - case manager Patient and family notified of of transfer: 03/31/21  Discharge Plan and Services In-house Referral: Clinical Social Work                                   Social Determinants of Health (SDOH) Interventions     Readmission Risk Interventions No flowsheet data found.

## 2021-03-31 NOTE — Discharge Summary (Signed)
Physician Discharge Summary  Fernando Carr [id number] DOB: 1941/04/05 DOA: 03/24/2021  PCP: [name], MD  Admit date: 03/24/2021 Discharge date: 03/31/2021  Admitted From: Home Disposition:  [facility]  Recommendations for Outpatient Follow-up:  Follow up with PCP in 1-2 weeks Midodrine increased to 5 mg p.o. 3 times daily Recommend use of abdominal binder and TED hose during mobilization   Discharge Condition: Stable CODE STATUS: DNR Diet recommendation: Heart healthy diet  History of present illness:  RUZGAR Carr is an 80 year old male with past medical history significant for Parkinson's disease with complications of dysautonomia and frequent syncopal episodes, CKD stage IIIb, AAA, atrial fibrillation not on chronic anticoagulation due to recurrent falls, hyperlipidemia who presented to [facility] ED on 8/24 following 3 syncopal episodes.  Patient was found on the ground with abrasions to both forearms he was also noted be hypotensive.  Per his caregiver, his eyes rolled back in his head and he had full body shaking lasting approximately 20 seconds to 1 minute with no urinary/fecal incontinence and no tongue biting.  Patient denied any aura prior to these episodes.  Patient currently resides independently in a senior living facility who has a caregiver who checks on him.  Recently admitted to the hospital from June 30 through July 3 for syncopal episodes related to orthostatic hypotension from dysautonomia from his Parkinson's disease.  In the ED, sodium 140, potassium 4.3, chloride 111, CO2 22, glucose 130, BUN 46, creatinine 2.24 (baseline 1.3-1.5), WBC 10.3, hemoglobin 10.7, platelet count 162.  Urinalysis unrevealing.  Chest x-ray with no acute cardiopulmonary disease process.  EKG with NSR with no dynamic changes.  Duration consulted for further evaluation and management of recurrent syncopal episodes.   Hospital course:  Recurrent syncope Patient presenting to the ED following  recurrent syncopal episodes.  Etiology likely secondary to orthostasis/dysautonomia in the setting of his underlying Parkinson's disease.  Patient also with dehydration on physical exam and was treated with IV fluids.  Midodrine was increased to 5 mg p.o. 3 times daily.  Continue TED hose for compression and abdominal binder.  Discharging to SNF for further rehabilitation.  Acute renal failure on CKD stage IIIb Creatinine on admission 2.24 with a baseline 1.3-1.5.  Etiology likely secondary to prerenal azotemia from dehydration versus ATN from hypotension.  Patient was supported with IV fluid hydration with improvement of creatinine to 1.50 at time of discharge.  Now on increased dose of midodrine as above.  Continue to encourage increased fluid intake.  Paroxysmal atrial fibrillation Not on anticoagulation likely secondary to frequent syncopal episodes with increased risk for bleeding and falls.  Parkinson's disease: Continue Sinemet  Depression: Continue sertraline and risperidone  Hyperlipidemia:  continue fenofibrate and simvastatin  Tobacco use disorder Counseled on need for complete cessation.    Discharge Diagnoses:  Principal Problem:   Syncope and collapse Active Problems:   Afib (HCC)   Hypotension, postural   MDD (major depressive disorder), recurrent severe, without psychosis ([place])   Parkinson's disease ([place])   Resting tremor   Nicotine dependence    Discharge Instructions  Discharge Instructions     Call MD for:  difficulty breathing, headache or visual disturbances   Complete by: As directed    Call MD for:  extreme fatigue   Complete by: As directed    Call MD for:  persistant dizziness or light-headedness   Complete by: As directed    Call MD for:  persistant nausea and vomiting   Complete by: As directed  Call MD for:  severe uncontrolled pain   Complete by: As directed    Call MD for:  temperature >100.4   Complete by: As directed    Diet - low  sodium heart healthy   Complete by: As directed    Increase activity slowly   Complete by: As directed       Allergies as of 03/31/2021   No Known Allergies      Medication List     TAKE these medications    aspirin 81 MG EC tablet Take 1 tablet (81 mg total) by mouth daily. Swallow whole. Start taking on: April 01, 2021   carbidopa-levodopa 25-100 MG tablet Commonly known as: SINEMET IR Take 1 tablet by mouth 3 (three) times daily.   fenofibrate 54 MG tablet Take 54 mg by mouth daily.   midodrine 5 MG tablet Commonly known as: PROAMATINE Take 1 tablet (5 mg total) by mouth 3 (three) times daily with meals. What changed: when to take this   risperiDONE 0.5 MG tablet Commonly known as: RisperDAL Take 1 tablet (0.5 mg total) by mouth at bedtime.   senna 8.6 MG Tabs tablet Commonly known as: SENOKOT Take 1 tablet (8.6 mg total) by mouth 2 (two) times daily.   sertraline 100 MG tablet Commonly known as: ZOLOFT Take 1 tablet (100 mg total) by mouth daily.   simvastatin 10 MG tablet Commonly known as: ZOCOR Take 1 tablet (10 mg total) by mouth daily at 6 PM. What changed: when to take this   vitamin B-12 1000 MCG tablet Commonly known as: CYANOCOBALAMIN Take 1 tablet (1,000 mcg total) by mouth daily.        Follow-up Information     [name], MD. Schedule an appointment as soon as possible for a visit in 1 week(s).   Specialty: Internal Medicine Contact information: [address] [place] [address] [phone]         Minus Breeding, MD .   Specialty: Cardiology Contact information: [address] [place] [address] [phone]                No Known Allergies  Consultations: None   Procedures/Studies: CT HEAD WO CONTRAST (5MM)  Result Date: 03/24/2021 CLINICAL DATA:  Seizure, nontraumatic. EXAM: CT HEAD WITHOUT CONTRAST TECHNIQUE: Contiguous axial images were obtained from the base  of the skull through the vertex without intravenous contrast. COMPARISON:  09/26/2019 FINDINGS: Brain: Stable cerebral atrophy. Small amount of low-density in the white matter is similar to the previous examination. No evidence for acute hemorrhage, mass lesion, midline shift, hydrocephalus or large infarct. Vascular: No hyperdense vessel or unexpected calcification. Skull: Normal. Negative for fracture or focal lesion. Sinuses/Orbits: Mild mucosal thickening in the right maxillary sinus. Mucosal thickening in the ethmoid air cells and frontal sinuses. Other: None IMPRESSION: 1. No acute intracranial abnormality. 2. Stable cerebral atrophy. Electronically Signed   By: Markus Daft M.D.   On: 03/24/2021 16:18   DG Chest Port 1 View  Result Date: 03/24/2021 CLINICAL DATA:  Syncope, hypoxia EXAM: PORTABLE CHEST 1 VIEW COMPARISON:  Chest radiograph 02/23/2021 FINDINGS: The heart size is within normal limits. The mediastinum is prominent, similar to the prior study. There is no focal consolidation or pulmonary edema. There is no pleural effusion or pneumothorax. There is no acute osseous abnormality. IMPRESSION: Stable chest with no radiographic evidence of acute cardiopulmonary process. Electronically Signed   By: Valetta Mole M.D.   On: 03/24/2021  13:42     Subjective: Patient seen examined at bedside, resting comfortably.  Eating breakfast in bedside chair.  Inquiring when he will be discharging to SNF.  Received authorization today from insurance company.  No other questions or concerns at this time.  Denies headache, no fever/chills/night sweats, no nausea/vomiting/diarrhea, no chest pain, no palpitations, no shortness of breath, no abdominal pain, no weakness, no fatigue, no paresthesias.  No acute events overnight per nursing staff.  Discharge Exam: Vitals:   03/31/21 0806 03/31/21 1117  BP: 125/74 (!) 114/59  Pulse: (!) 59 64  Resp: 18 15  Temp: (!) 97.5 F (36.4 C) (!) 97.4 F (36.3 C)  SpO2:  94% 96%   Vitals:   03/30/21 2328 03/31/21 0348 03/31/21 0806 03/31/21 1117  BP: 137/68 (!) 113/58 125/74 (!) 114/59  Pulse: (!) 53 61 (!) 59 64  Resp: '16 15 18 15  '$ Temp: (!) 97.5 F (36.4 C) (!) 97.4 F (36.3 C) (!) 97.5 F (36.4 C) (!) 97.4 F (36.3 C)  TempSrc: Oral Oral Oral Oral  SpO2: 99% 98% 94% 96%  Weight:      Height:        General: Pt is alert, awake, not in acute distress, elderly in appearance Cardiovascular: RRR, S1/S2 +, no rubs, no gallops Respiratory: CTA bilaterally, no wheezing, no rhonchi, on room air Abdominal: Soft, NT, ND, bowel sounds + Extremities: no edema, no cyanosis    The results of significant diagnostics from this hospitalization (including imaging, microbiology, ancillary and laboratory) are listed below for reference.     Microbiology: Recent Results (from the past 240 hour(s))  Resp Panel by RT-PCR (Flu A&B, Covid) Nasopharyngeal Swab     Status: None   Collection Time: 03/24/21  2:18 PM   Specimen: Nasopharyngeal Swab; Nasopharyngeal(NP) swabs in vial transport medium  Result Value Ref Range Status   SARS Coronavirus 2 by RT PCR NEGATIVE NEGATIVE Final    Comment: (NOTE) SARS-CoV-2 target nucleic acids are NOT DETECTED.  The SARS-CoV-2 RNA is generally detectable in upper respiratory specimens during the acute phase of infection. The lowest concentration of SARS-CoV-2 viral copies this assay can detect is 138 copies/mL. A negative result does not preclude SARS-Cov-2 infection and should not be used as the sole basis for treatment or other patient management decisions. A negative result may occur with  improper specimen collection/handling, submission of specimen other than nasopharyngeal swab, presence of viral mutation(s) within the areas targeted by this assay, and inadequate number of viral copies(<138 copies/mL). A negative result must be combined with clinical observations, patient history, and epidemiological information.  The expected result is Negative.  Fact Sheet for Patients:  EntrepreneurPulse.com.au  Fact Sheet for Healthcare Providers:  IncredibleEmployment.be  This test is no t yet approved or cleared by the [place] FDA and  has been authorized for detection and/or diagnosis of SARS-CoV-2 by FDA under an Emergency Use Authorization (EUA). This EUA will remain  in effect (meaning this test can be used) for the duration of the COVID-19 declaration under Section 564(b)(1) of the Act, 21 U.S.C.section 360bbb-3(b)(1), unless the authorization is terminated  or revoked sooner.       Influenza A by PCR NEGATIVE NEGATIVE Final   Influenza B by PCR NEGATIVE NEGATIVE Final    Comment: (NOTE) The Xpert Xpress SARS-CoV-2/FLU/RSV plus assay is intended as an aid in the diagnosis of influenza from Nasopharyngeal swab specimens and should not be used as a sole basis for treatment. Nasal washings  and aspirates are unacceptable for Xpert Xpress SARS-CoV-2/FLU/RSV testing.  Fact Sheet for Patients: EntrepreneurPulse.com.au  Fact Sheet for Healthcare Providers: IncredibleEmployment.be  This test is not yet approved or cleared by the [place] FDA and has been authorized for detection and/or diagnosis of SARS-CoV-2 by FDA under an Emergency Use Authorization (EUA). This EUA will remain in effect (meaning this test can be used) for the duration of the COVID-19 declaration under Section 564(b)(1) of the Act, 21 U.S.C. section 360bbb-3(b)(1), unless the authorization is terminated or revoked.  Performed at [facility], [place] [address] [place], [place] [address]   SARS CORONAVIRUS 2 (TAT 6-24 HRS) Nasopharyngeal Nasopharyngeal Swab     Status: None   Collection Time: 03/28/21  1:42 PM   Specimen: Nasopharyngeal Swab  Result Value Ref Range Status   SARS Coronavirus 2 NEGATIVE NEGATIVE Final    Comment:  (NOTE) SARS-CoV-2 target nucleic acids are NOT DETECTED.  The SARS-CoV-2 RNA is generally detectable in upper and lower respiratory specimens during the acute phase of infection. Negative results do not preclude SARS-CoV-2 infection, do not rule out co-infections with other pathogens, and should not be used as the sole basis for treatment or other patient management decisions. Negative results must be combined with clinical observations, patient history, and epidemiological information. The expected result is Negative.  Fact Sheet for Patients: SugarRoll.be  Fact Sheet for Healthcare Providers: https://www.woods-mathews.com/  This test is not yet approved or cleared by the [place] FDA and  has been authorized for detection and/or diagnosis of SARS-CoV-2 by FDA under an Emergency Use Authorization (EUA). This EUA will remain  in effect (meaning this test can be used) for the duration of the COVID-19 declaration under Se ction 564(b)(1) of the Act, 21 U.S.C. section 360bbb-3(b)(1), unless the authorization is terminated or revoked sooner.  Performed at [facility], [place] [address] [place], [place] [address]   SARS CORONAVIRUS 2 (TAT 6-24 HRS) Nasopharyngeal Nasopharyngeal Swab     Status: None   Collection Time: 03/30/21  3:50 PM   Specimen: Nasopharyngeal Swab  Result Value Ref Range Status   SARS Coronavirus 2 NEGATIVE NEGATIVE Final    Comment: (NOTE) SARS-CoV-2 target nucleic acids are NOT DETECTED.  The SARS-CoV-2 RNA is generally detectable in upper and lower respiratory specimens during the acute phase of infection. Negative results do not preclude SARS-CoV-2 infection, do not rule out co-infections with other pathogens, and should not be used as the sole basis for treatment or other patient management decisions. Negative results must be combined with clinical observations, patient history, and epidemiological  information. The expected result is Negative.  Fact Sheet for Patients: SugarRoll.be  Fact Sheet for Healthcare Providers: https://www.woods-mathews.com/  This test is not yet approved or cleared by the [place] FDA and  has been authorized for detection and/or diagnosis of SARS-CoV-2 by FDA under an Emergency Use Authorization (EUA). This EUA will remain  in effect (meaning this test can be used) for the duration of the COVID-19 declaration under Se ction 564(b)(1) of the Act, 21 U.S.C. section 360bbb-3(b)(1), unless the authorization is terminated or revoked sooner.  Performed at [facility], [place] [address], [place],  [address]      Labs: BNP (last 3 results) No results for input(s): BNP in the last 8760 hours. Basic Metabolic Panel: Recent Labs  Lab 03/24/21 1347 03/25/21 0154 03/26/21 0232 03/28/21 0229 03/30/21 0050 03/31/21 0514  NA 140 140 139 138 137  --  K 4.3 4.2 4.3 4.1 4.2  --   CL 111 111 110 105 106  --   CO2 '22 24 23 23 27  '$ --   GLUCOSE 130* 97 92 108* 104*  --   BUN 46* 43* 33* 30* 33*  --   CREATININE 2.24* 2.17* 1.70* 1.59* 1.42* 1.50*  CALCIUM 10.1 10.0 9.8 10.1 10.2  --    Liver Function Tests: Recent Labs  Lab 03/24/21 1347  AST 22  ALT <5  ALKPHOS 59  BILITOT 0.6  PROT 6.4*  ALBUMIN 3.5   No results for input(s): LIPASE, AMYLASE in the last 168 hours. No results for input(s): AMMONIA in the last 168 hours. CBC: Recent Labs  Lab 03/24/21 1347 03/25/21 0154 03/26/21 0232  WBC 10.3 6.1 5.3  NEUTROABS 9.0*  --   --   HGB 10.7* 9.8* 9.7*  HCT 34.2* 31.7* 30.8*  MCV 92.4 93.2 92.5  PLT 162 153 140*   Cardiac Enzymes: Recent Labs  Lab 03/24/21 1554  CKTOTAL 155   BNP: Invalid input(s): POCBNP CBG: No results for input(s): GLUCAP in the last 168 hours. D-Dimer No results for input(s): DDIMER in the last 72 hours. Hgb A1c No results for input(s): HGBA1C in the  last 72 hours. Lipid Profile No results for input(s): CHOL, HDL, LDLCALC, TRIG, CHOLHDL, LDLDIRECT in the last 72 hours. Thyroid function studies No results for input(s): TSH, T4TOTAL, T3FREE, THYROIDAB in the last 72 hours.  Invalid input(s): FREET3 Anemia work up No results for input(s): VITAMINB12, FOLATE, FERRITIN, TIBC, IRON, RETICCTPCT in the last 72 hours. Urinalysis    Component Value Date/Time   COLORURINE STRAW (A) 02/23/2021 2210   APPEARANCEUR CLEAR 02/23/2021 2210   LABSPEC 1.018 02/23/2021 2210   PHURINE 6.0 02/23/2021 2210   GLUCOSEU 50 (A) 02/23/2021 2210   HGBUR NEGATIVE 02/23/2021 2210   BILIRUBINUR NEGATIVE 02/23/2021 2210   KETONESUR NEGATIVE 02/23/2021 2210   PROTEINUR NEGATIVE 02/23/2021 2210   UROBILINOGEN 1.0 08/19/2014 1745   NITRITE NEGATIVE 02/23/2021 2210   LEUKOCYTESUR NEGATIVE 02/23/2021 2210   Sepsis Labs Invalid input(s): PROCALCITONIN,  WBC,  LACTICIDVEN Microbiology Recent Results (from the past 240 hour(s))  Resp Panel by RT-PCR (Flu A&B, Covid) Nasopharyngeal Swab     Status: None   Collection Time: 03/24/21  2:18 PM   Specimen: Nasopharyngeal Swab; Nasopharyngeal(NP) swabs in vial transport medium  Result Value Ref Range Status   SARS Coronavirus 2 by RT PCR NEGATIVE NEGATIVE Final    Comment: (NOTE) SARS-CoV-2 target nucleic acids are NOT DETECTED.  The SARS-CoV-2 RNA is generally detectable in upper respiratory specimens during the acute phase of infection. The lowest concentration of SARS-CoV-2 viral copies this assay can detect is 138 copies/mL. A negative result does not preclude SARS-Cov-2 infection and should not be used as the sole basis for treatment or other patient management decisions. A negative result may occur with  improper specimen collection/handling, submission of specimen other than nasopharyngeal swab, presence of viral mutation(s) within the areas targeted by this assay, and inadequate number of viral copies(<138  copies/mL). A negative result must be combined with clinical observations, patient history, and epidemiological information. The expected result is Negative.  Fact Sheet for Patients:  EntrepreneurPulse.com.au  Fact Sheet for Healthcare Providers:  IncredibleEmployment.be  This test is no t yet approved or cleared by the [place] FDA and  has been authorized for detection and/or diagnosis of SARS-CoV-2 by FDA under an Emergency Use Authorization (EUA). This EUA will remain  in effect (meaning this test can be used) for the duration of the COVID-19 declaration under Section 564(b)(1) of the Act, 21 U.S.C.section 360bbb-3(b)(1), unless the authorization is terminated  or revoked sooner.       Influenza A by PCR NEGATIVE NEGATIVE Final   Influenza B by PCR NEGATIVE NEGATIVE Final    Comment: (NOTE) The Xpert Xpress SARS-CoV-2/FLU/RSV plus assay is intended as an aid in the diagnosis of influenza from Nasopharyngeal swab specimens and should not be used as a sole basis for treatment. Nasal washings and aspirates are unacceptable for Xpert Xpress SARS-CoV-2/FLU/RSV testing.  Fact Sheet for Patients: EntrepreneurPulse.com.au  Fact Sheet for Healthcare Providers: IncredibleEmployment.be  This test is not yet approved or cleared by the [place] FDA and has been authorized for detection and/or diagnosis of SARS-CoV-2 by FDA under an Emergency Use Authorization (EUA). This EUA will remain in effect (meaning this test can be used) for the duration of the COVID-19 declaration under Section 564(b)(1) of the Act, 21 U.S.C. section 360bbb-3(b)(1), unless the authorization is terminated or revoked.  Performed at [facility], [place] [address], [place], [place] [address]   SARS CORONAVIRUS 2 (TAT 6-24 HRS) Nasopharyngeal Nasopharyngeal Swab     Status: None   Collection Time: 03/28/21  1:42 PM    Specimen: Nasopharyngeal Swab  Result Value Ref Range Status   SARS Coronavirus 2 NEGATIVE NEGATIVE Final    Comment: (NOTE) SARS-CoV-2 target nucleic acids are NOT DETECTED.  The SARS-CoV-2 RNA is generally detectable in upper and lower respiratory specimens during the acute phase of infection. Negative results do not preclude SARS-CoV-2 infection, do not rule out co-infections with other pathogens, and should not be used as the sole basis for treatment or other patient management decisions. Negative results must be combined with clinical observations, patient history, and epidemiological information. The expected result is Negative.  Fact Sheet for Patients: SugarRoll.be  Fact Sheet for Healthcare Providers: https://www.woods-mathews.com/  This test is not yet approved or cleared by the [place] FDA and  has been authorized for detection and/or diagnosis of SARS-CoV-2 by FDA under an Emergency Use Authorization (EUA). This EUA will remain  in effect (meaning this test can be used) for the duration of the COVID-19 declaration under Se ction 564(b)(1) of the Act, 21 U.S.C. section 360bbb-3(b)(1), unless the authorization is terminated or revoked sooner.  Performed at [facility], [place] [address], [place], [place] [address]   SARS CORONAVIRUS 2 (TAT 6-24 HRS) Nasopharyngeal Nasopharyngeal Swab     Status: None   Collection Time: 03/30/21  3:50 PM   Specimen: Nasopharyngeal Swab  Result Value Ref Range Status   SARS Coronavirus 2 NEGATIVE NEGATIVE Final    Comment: (NOTE) SARS-CoV-2 target nucleic acids are NOT DETECTED.  The SARS-CoV-2 RNA is generally detectable in upper and lower respiratory specimens during the acute phase of infection. Negative results do not preclude SARS-CoV-2 infection, do not rule out co-infections with other pathogens, and should not be used as the sole basis for treatment or other patient  management decisions. Negative results must be combined with clinical observations, patient history, and epidemiological information. The expected result is Negative.  Fact Sheet for Patients: SugarRoll.be  Fact Sheet for Healthcare Providers: https://www.woods-mathews.com/  This test is not yet approved or cleared by the [place] FDA and  has been authorized for detection and/or diagnosis of SARS-CoV-2 by FDA under an Emergency Use Authorization (EUA). This EUA will remain  in effect (meaning  this test can be used) for the duration of the COVID-19 declaration under Se ction 564(b)(1) of the Act, 21 U.S.C. section 360bbb-3(b)(1), unless the authorization is terminated or revoked sooner.  Performed at [facility], [place] [address], [place], [place] [address]      Time coordinating discharge: Over 30 minutes  SIGNED:   Syretta Kochel J [place] DO  [facility] 03/31/2021, 1:41 PM

## 2021-04-01 ENCOUNTER — Non-Acute Institutional Stay (SKILLED_NURSING_FACILITY): Payer: Medicare HMO | Admitting: Adult Health

## 2021-04-01 ENCOUNTER — Encounter: Payer: Self-pay | Admitting: Adult Health

## 2021-04-01 DIAGNOSIS — G20 Parkinson's disease: Secondary | ICD-10-CM

## 2021-04-01 DIAGNOSIS — N179 Acute kidney failure, unspecified: Secondary | ICD-10-CM

## 2021-04-01 DIAGNOSIS — F332 Major depressive disorder, recurrent severe without psychotic features: Secondary | ICD-10-CM | POA: Diagnosis not present

## 2021-04-01 DIAGNOSIS — R55 Syncope and collapse: Secondary | ICD-10-CM

## 2021-04-01 DIAGNOSIS — I951 Orthostatic hypotension: Secondary | ICD-10-CM | POA: Diagnosis not present
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
## 2021-04-02 ENCOUNTER — Encounter: Payer: Self-pay | Admitting: Internal Medicine

## 2021-04-02 ENCOUNTER — Non-Acute Institutional Stay (SKILLED_NURSING_FACILITY): Payer: Medicare HMO | Admitting: Internal Medicine

## 2021-04-02 DIAGNOSIS — N179 Acute kidney failure, unspecified: Secondary | ICD-10-CM

## 2021-04-02 DIAGNOSIS — I480 Paroxysmal atrial fibrillation: Secondary | ICD-10-CM | POA: Diagnosis not present

## 2021-04-02 DIAGNOSIS — R55 Syncope and collapse: Secondary | ICD-10-CM

## 2021-04-02 NOTE — Assessment & Plan Note (Signed)
Clinically rhythm is slightly irregular but rate is controlled.  Heart sounds are distant making it difficult to define the rhythm.

## 2021-04-02 NOTE — Progress Notes (Signed)
NURSING HOME LOCATION:  [facility] ROOM NUMBER:  [id number]  CODE STATUS:  DNR  PCP:  [name] MD  This is a comprehensive admission note to this SNF performed on this date less than 30 days from date of admission. Included are preadmission medical/surgical history; reconciled medication list; family history; social history and comprehensive review of systems.  Corrections and additions to the records were documented. Comprehensive physical exam was also performed. Additionally a clinical summary was entered for each active diagnosis pertinent to this admission in the Problem List to enhance continuity of care.  HPI: He was hospitalized 8/24 - 03/31/2021 , presenting following 3 syncopal episodes.  This is in the context of Parkinson's disease complicated by dysautonomia and frequent syncopal episodes.  He also has paroxysmal A. fib but not on anticoagulation due to the recurrent falls. He was found on the ground with abrasions to both forearms and hypotension was documented.  He was described as exhibiting full body shaking with eyes rolling back in his head for approximately 20 to 60 seconds.  There was no associated urinary or fecal incontinence and no tongue biting. He had been admitted to the hospital 6/30 - 01/31/2021 for syncopal episodes related to orthostatic hypotension from the dysautonomia in the context of the Parkinson's disease. AKI was documented with a creatinine of 2.24; baseline is felt to be 1.3-1.5.  With IV rehydration creatinine dropped to 1.50 at discharge. Hemoglobin was 10.7 and platelet count was low normal at 162,000. Neurology consulted; etiology was felt to be the orthostasis/dysautonomia related to the Parkinson's and clinical dehydration.  He did receive IV fluids.  Midodrine was increased to 5 mg 3 times daily.  TED hose and abdominal binder also recommended. He was discharged to SNF for rehab.  Past medical and surgical history: Includes  history of abdominal aortic aneurysm, bipolar disorder, CKD, chronic depression, dyslipidemia, Parkinson's disease, and history of renal insufficiency. Surgeries and procedures include abdominal aortic aneurysm repair and left knee arthroscopy and right THA.  Social history: Nondrinker; smoker with 40-pack-year history.  He is a former Engineer, technical sales.He does reside independently in a senior living facility and has a caregiver who checks on him.    Family history: Noncontributory due to advanced age.   Review of systems: He denies any cardiac prodrome.He states that symptoms come on suddenly.  He denies other active symptoms.  Constitutional: No fever, significant weight change, fatigue  Eyes: No redness, discharge, pain, vision change ENT/mouth: No nasal congestion, purulent discharge, earache, change in hearing, sore throat  Cardiovascular: No chest pain, palpitations, paroxysmal nocturnal dyspnea, claudication, edema  Respiratory: No cough, sputum production, hemoptysis, DOE, significant snoring, apnea Gastrointestinal: No heartburn, dysphagia, abdominal pain, nausea /vomiting, rectal bleeding, melena, change in bowels Genitourinary: No dysuria, hematuria, pyuria, incontinence, nocturia Musculoskeletal: No joint stiffness, joint swelling, weakness, pain Dermatologic: No rash, pruritus, change in appearance of skin Psychiatric: No significant anxiety, depression, insomnia, anorexia Endocrine: No change in hair/skin/nails, excessive thirst, excessive hunger, excessive urination  Hematologic/lymphatic: No significant bruising, lymphadenopathy, abnormal bleeding Allergy/immunology: No itchy/watery eyes, significant sneezing, urticaria, angioedema  Physical exam:  Pertinent or positive findings: He appears his stated age.  Hair, mustache, & beard are unkempt.  His voice is hoarse.  Ptosis is present on the left.  The corner of the left mouth sags slightly.  There is some dental  malalignment.  Heart sounds and breath sounds are markedly decreased.  Cardiac rhythm clinically appears to be  slightly irregular.  He is wearing an abdominal binder.  Pedal pulses are decreased.  Posterior tibial pulses are stronger than the dorsalis pedis pulses. Thick eschar is present over the right knee in 2 locations.  There is marked bruising over the dorsum of the hands, greater on the right.  He exhibits a coarse tremor of the hands, typically greater on the right.  General appearance: no acute distress, increased work of breathing is present.   Lymphatic: No lymphadenopathy about the head, neck, axilla. Eyes: No conjunctival inflammation or lid edema is present. There is no scleral icterus. Ears:  External ear exam shows no significant lesions or deformities.   Nose:  External nasal examination shows no deformity or inflammation. Nasal mucosa are pink and moist without lesions, exudates Oral exam: Lips and gums are healthy appearing.There is no oropharyngeal erythema or exudate. Neck:  No thyromegaly, masses, tenderness noted.    Heart:  No gallop, murmur, click, rub.  Lungs:  without wheezes, rhonchi, rales, rubs Abdomen: Bowel sounds are normal.  Abdomen is soft and nontender with no organomegaly, hernias, masses. GU: Deferred  Extremities:  No cyanosis, clubbing, edema. Neurologic exam: Balance, Rhomberg, finger to nose testing could not be completed due to clinical state Skin: Warm & dry w/o tenting. No significant rash.  See clinical summary under each active problem in the Problem List with associated updated therapeutic plan

## 2021-04-02 NOTE — Patient Instructions (Signed)
See assessment and plan under each diagnosis in the problem list and acutely for this visit 

## 2021-04-04 NOTE — Assessment & Plan Note (Signed)
Midodrine dose safe with present CKD severity

## 2021-04-04 NOTE — Assessment & Plan Note (Signed)
Isometric exercises discussed, to be performed prior to standing

## 2021-04-14 ENCOUNTER — Other Ambulatory Visit: Payer: Self-pay | Admitting: *Deleted

## 2021-04-14 DIAGNOSIS — I712 Thoracic aortic aneurysm, without rupture, unspecified: Secondary | ICD-10-CM

## 2021-04-15 ENCOUNTER — Non-Acute Institutional Stay (SKILLED_NURSING_FACILITY): Payer: Medicare HMO | Admitting: Adult Health

## 2021-04-15 ENCOUNTER — Encounter: Payer: Self-pay | Admitting: Adult Health

## 2021-04-15 DIAGNOSIS — I480 Paroxysmal atrial fibrillation: Secondary | ICD-10-CM

## 2021-04-15 DIAGNOSIS — F332 Major depressive disorder, recurrent severe without psychotic features: Secondary | ICD-10-CM

## 2021-04-15 DIAGNOSIS — I951 Orthostatic hypotension: Secondary | ICD-10-CM

## 2021-04-15 DIAGNOSIS — G20 Parkinson's disease: Secondary | ICD-10-CM
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
## 2021-04-20 ENCOUNTER — Non-Acute Institutional Stay (SKILLED_NURSING_FACILITY): Payer: Medicare HMO | Admitting: Adult Health

## 2021-04-20 ENCOUNTER — Encounter: Payer: Self-pay | Admitting: Adult Health

## 2021-04-20 DIAGNOSIS — I951 Orthostatic hypotension: Secondary | ICD-10-CM | POA: Diagnosis not present

## 2021-04-20 DIAGNOSIS — G20 Parkinson's disease: Secondary | ICD-10-CM | POA: Diagnosis not present

## 2021-04-20 DIAGNOSIS — F332 Major depressive disorder, recurrent severe without psychotic features: Secondary | ICD-10-CM
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
## 2021-04-22 ENCOUNTER — Encounter: Payer: Self-pay | Admitting: Adult Health

## 2021-04-22 ENCOUNTER — Non-Acute Institutional Stay (SKILLED_NURSING_FACILITY): Payer: Medicare HMO | Admitting: Adult Health

## 2021-04-22 DIAGNOSIS — R55 Syncope and collapse: Secondary | ICD-10-CM | POA: Diagnosis not present

## 2021-04-22 DIAGNOSIS — I480 Paroxysmal atrial fibrillation: Secondary | ICD-10-CM

## 2021-04-22 DIAGNOSIS — I951 Orthostatic hypotension: Secondary | ICD-10-CM

## 2021-04-22 DIAGNOSIS — G20 Parkinson's disease: Secondary | ICD-10-CM | POA: Diagnosis not present

## 2021-04-22 DIAGNOSIS — N179 Acute kidney failure, unspecified: Secondary | ICD-10-CM

## 2021-04-22 DIAGNOSIS — F332 Major depressive disorder, recurrent severe without psychotic features: Secondary | ICD-10-CM

## 2021-04-22 DIAGNOSIS — F419 Anxiety disorder, unspecified: Secondary | ICD-10-CM

## 2021-04-22 DIAGNOSIS — E782 Mixed hyperlipidemia: Secondary | ICD-10-CM | POA: Diagnosis not present

## 2021-04-22 DIAGNOSIS — F319 Bipolar disorder, unspecified: Secondary | ICD-10-CM
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
## 2021-04-23 MED ORDER — MIDODRINE HCL 5 MG PO TABS: 5.0000 mg | ORAL_TABLET | Freq: Three times a day (TID) | ORAL | 0 refills | Status: DC

## 2021-04-23 MED ORDER — SIMVASTATIN 10 MG PO TABS: 10.0000 mg | ORAL_TABLET | Freq: Every day | ORAL | 0 refills | Status: AC

## 2021-04-23 MED ORDER — CARBIDOPA-LEVODOPA 25-100 MG PO TABS: 1.0000 | ORAL_TABLET | Freq: Three times a day (TID) | ORAL | 0 refills | Status: DC

## 2021-04-23 MED ORDER — SENNA 8.6 MG PO TABS: 1.0000 | ORAL_TABLET | Freq: Two times a day (BID) | ORAL | 0 refills | Status: DC

## 2021-04-23 MED ORDER — SERTRALINE HCL 100 MG PO TABS: 100.0000 mg | ORAL_TABLET | Freq: Every day | ORAL | 0 refills | Status: DC

## 2021-04-23 MED ORDER — ASPIRIN 81 MG PO TBEC: 81.0000 mg | DELAYED_RELEASE_TABLET | Freq: Every day | ORAL | 0 refills | Status: DC

## 2021-04-23 MED ORDER — VITAMIN B-12 1000 MCG PO TABS: 1000.0000 ug | ORAL_TABLET | Freq: Every day | ORAL | 0 refills | Status: DC

## 2021-04-23 MED ORDER — RISPERIDONE 0.5 MG PO TABS: 0.5000 mg | ORAL_TABLET | Freq: Every day | ORAL | 0 refills | Status: DC

## 2021-04-23 MED ORDER — FENOFIBRATE 54 MG PO TABS: 54.0000 mg | ORAL_TABLET | Freq: Every day | ORAL | 0 refills | Status: AC

## 2021-04-24 DIAGNOSIS — G20 Parkinson's disease: Secondary | ICD-10-CM | POA: Diagnosis not present

## 2021-04-24 DIAGNOSIS — R41841 Cognitive communication deficit: Secondary | ICD-10-CM | POA: Diagnosis not present

## 2021-04-24 DIAGNOSIS — R2681 Unsteadiness on feet: Secondary | ICD-10-CM | POA: Diagnosis not present

## 2021-04-24 DIAGNOSIS — R488 Other symbolic dysfunctions: Secondary | ICD-10-CM | POA: Diagnosis not present

## 2021-04-24 DIAGNOSIS — M6281 Muscle weakness (generalized): Secondary | ICD-10-CM | POA: Diagnosis not present

## 2021-04-24 DIAGNOSIS — R2689 Other abnormalities of gait and mobility: Secondary | ICD-10-CM | POA: Diagnosis not present

## 2021-04-26 ENCOUNTER — Ambulatory Visit (HOSPITAL_COMMUNITY): Payer: Medicare HMO | Attending: Vascular Surgery

## 2021-04-26 ENCOUNTER — Ambulatory Visit (HOSPITAL_COMMUNITY): Admission: RE | Admit: 2021-04-26 | Payer: Medicare HMO | Source: Ambulatory Visit

## 2021-04-26 DIAGNOSIS — M6281 Muscle weakness (generalized): Secondary | ICD-10-CM | POA: Diagnosis not present

## 2021-04-26 DIAGNOSIS — R2681 Unsteadiness on feet: Secondary | ICD-10-CM | POA: Diagnosis not present

## 2021-04-26 DIAGNOSIS — G20 Parkinson's disease: Secondary | ICD-10-CM | POA: Diagnosis not present

## 2021-04-26 DIAGNOSIS — R55 Syncope and collapse: Secondary | ICD-10-CM | POA: Diagnosis not present

## 2021-04-26 DIAGNOSIS — R2689 Other abnormalities of gait and mobility: Secondary | ICD-10-CM | POA: Diagnosis not present

## 2021-04-26 DIAGNOSIS — Z20820 Contact with and (suspected) exposure to varicella: Secondary | ICD-10-CM | POA: Diagnosis not present

## 2021-04-28 DIAGNOSIS — R55 Syncope and collapse: Secondary | ICD-10-CM | POA: Diagnosis not present

## 2021-04-28 DIAGNOSIS — M6281 Muscle weakness (generalized): Secondary | ICD-10-CM | POA: Diagnosis not present

## 2021-04-29 ENCOUNTER — Encounter: Payer: Medicare HMO | Admitting: Vascular Surgery

## 2021-04-29 DIAGNOSIS — R2681 Unsteadiness on feet: Secondary | ICD-10-CM | POA: Diagnosis not present

## 2021-04-29 DIAGNOSIS — R55 Syncope and collapse: Secondary | ICD-10-CM | POA: Diagnosis not present

## 2021-04-29 DIAGNOSIS — G20 Parkinson's disease: Secondary | ICD-10-CM | POA: Diagnosis not present

## 2021-04-29 DIAGNOSIS — R2689 Other abnormalities of gait and mobility: Secondary | ICD-10-CM | POA: Diagnosis not present

## 2021-04-29 DIAGNOSIS — M6281 Muscle weakness (generalized): Secondary | ICD-10-CM | POA: Diagnosis not present

## 2021-04-30 DIAGNOSIS — G20 Parkinson's disease: Secondary | ICD-10-CM | POA: Diagnosis not present

## 2021-04-30 DIAGNOSIS — R55 Syncope and collapse: Secondary | ICD-10-CM | POA: Diagnosis not present

## 2021-04-30 DIAGNOSIS — R2689 Other abnormalities of gait and mobility: Secondary | ICD-10-CM | POA: Diagnosis not present

## 2021-04-30 DIAGNOSIS — M6281 Muscle weakness (generalized): Secondary | ICD-10-CM | POA: Diagnosis not present

## 2021-04-30 DIAGNOSIS — R2681 Unsteadiness on feet: Secondary | ICD-10-CM | POA: Diagnosis not present

## 2021-05-03 DIAGNOSIS — G20 Parkinson's disease: Secondary | ICD-10-CM | POA: Diagnosis not present

## 2021-05-03 DIAGNOSIS — R55 Syncope and collapse: Secondary | ICD-10-CM | POA: Diagnosis not present

## 2021-05-03 DIAGNOSIS — M6281 Muscle weakness (generalized): Secondary | ICD-10-CM | POA: Diagnosis not present

## 2021-05-03 DIAGNOSIS — R2681 Unsteadiness on feet: Secondary | ICD-10-CM | POA: Diagnosis not present

## 2021-05-03 DIAGNOSIS — R2689 Other abnormalities of gait and mobility: Secondary | ICD-10-CM | POA: Diagnosis not present

## 2021-05-04 DIAGNOSIS — R2681 Unsteadiness on feet: Secondary | ICD-10-CM | POA: Diagnosis not present

## 2021-05-04 DIAGNOSIS — M6281 Muscle weakness (generalized): Secondary | ICD-10-CM | POA: Diagnosis not present

## 2021-05-04 DIAGNOSIS — G20 Parkinson's disease: Secondary | ICD-10-CM | POA: Diagnosis not present

## 2021-05-04 DIAGNOSIS — R2689 Other abnormalities of gait and mobility: Secondary | ICD-10-CM | POA: Diagnosis not present

## 2021-05-04 DIAGNOSIS — R55 Syncope and collapse: Secondary | ICD-10-CM | POA: Diagnosis not present

## 2021-05-05 DIAGNOSIS — M6281 Muscle weakness (generalized): Secondary | ICD-10-CM | POA: Diagnosis not present

## 2021-05-05 DIAGNOSIS — G20 Parkinson's disease: Secondary | ICD-10-CM | POA: Diagnosis not present

## 2021-05-05 DIAGNOSIS — R2689 Other abnormalities of gait and mobility: Secondary | ICD-10-CM | POA: Diagnosis not present

## 2021-05-05 DIAGNOSIS — R2681 Unsteadiness on feet: Secondary | ICD-10-CM | POA: Diagnosis not present

## 2021-05-06 ENCOUNTER — Telehealth (HOSPITAL_COMMUNITY): Payer: Medicare HMO | Admitting: Psychiatry

## 2021-05-06 DIAGNOSIS — R2681 Unsteadiness on feet: Secondary | ICD-10-CM | POA: Diagnosis not present

## 2021-05-06 DIAGNOSIS — R2689 Other abnormalities of gait and mobility: Secondary | ICD-10-CM | POA: Diagnosis not present

## 2021-05-06 DIAGNOSIS — R55 Syncope and collapse: Secondary | ICD-10-CM | POA: Diagnosis not present

## 2021-05-06 DIAGNOSIS — G20 Parkinson's disease: Secondary | ICD-10-CM | POA: Diagnosis not present

## 2021-05-06 DIAGNOSIS — M6281 Muscle weakness (generalized): Secondary | ICD-10-CM | POA: Diagnosis not present

## 2021-05-10 DIAGNOSIS — Z23 Encounter for immunization: Secondary | ICD-10-CM | POA: Diagnosis not present

## 2021-05-10 DIAGNOSIS — F331 Major depressive disorder, recurrent, moderate: Secondary | ICD-10-CM | POA: Diagnosis not present

## 2021-05-10 DIAGNOSIS — R55 Syncope and collapse: Secondary | ICD-10-CM | POA: Diagnosis not present

## 2021-05-10 DIAGNOSIS — F3170 Bipolar disorder, currently in remission, most recent episode unspecified: Secondary | ICD-10-CM | POA: Diagnosis not present

## 2021-05-10 DIAGNOSIS — K089 Disorder of teeth and supporting structures, unspecified: Secondary | ICD-10-CM | POA: Diagnosis not present

## 2021-05-10 DIAGNOSIS — G20 Parkinson's disease: Secondary | ICD-10-CM | POA: Diagnosis not present

## 2021-05-10 DIAGNOSIS — N1831 Chronic kidney disease, stage 3a: Secondary | ICD-10-CM | POA: Diagnosis not present

## 2021-05-10 DIAGNOSIS — M6281 Muscle weakness (generalized): Secondary | ICD-10-CM | POA: Diagnosis not present

## 2021-05-10 DIAGNOSIS — G909 Disorder of the autonomic nervous system, unspecified: Secondary | ICD-10-CM | POA: Diagnosis not present

## 2021-05-10 DIAGNOSIS — R251 Tremor, unspecified: Secondary | ICD-10-CM | POA: Diagnosis not present

## 2021-05-10 DIAGNOSIS — R269 Unspecified abnormalities of gait and mobility: Secondary | ICD-10-CM | POA: Diagnosis not present

## 2021-05-11 DIAGNOSIS — R55 Syncope and collapse: Secondary | ICD-10-CM | POA: Diagnosis not present

## 2021-05-11 DIAGNOSIS — M6281 Muscle weakness (generalized): Secondary | ICD-10-CM | POA: Diagnosis not present

## 2021-05-12 DIAGNOSIS — G20 Parkinson's disease: Secondary | ICD-10-CM | POA: Diagnosis not present

## 2021-05-12 DIAGNOSIS — R2689 Other abnormalities of gait and mobility: Secondary | ICD-10-CM | POA: Diagnosis not present

## 2021-05-12 DIAGNOSIS — M6281 Muscle weakness (generalized): Secondary | ICD-10-CM | POA: Diagnosis not present

## 2021-05-12 DIAGNOSIS — R2681 Unsteadiness on feet: Secondary | ICD-10-CM | POA: Diagnosis not present

## 2021-05-13 DIAGNOSIS — R2681 Unsteadiness on feet: Secondary | ICD-10-CM | POA: Diagnosis not present

## 2021-05-13 DIAGNOSIS — G20 Parkinson's disease: Secondary | ICD-10-CM | POA: Diagnosis not present

## 2021-05-13 DIAGNOSIS — M6281 Muscle weakness (generalized): Secondary | ICD-10-CM | POA: Diagnosis not present

## 2021-05-13 DIAGNOSIS — R2689 Other abnormalities of gait and mobility: Secondary | ICD-10-CM | POA: Diagnosis not present

## 2021-05-17 DIAGNOSIS — M6281 Muscle weakness (generalized): Secondary | ICD-10-CM | POA: Diagnosis not present

## 2021-05-17 DIAGNOSIS — R55 Syncope and collapse: Secondary | ICD-10-CM | POA: Diagnosis not present

## 2021-05-18 DIAGNOSIS — M6281 Muscle weakness (generalized): Secondary | ICD-10-CM | POA: Diagnosis not present

## 2021-05-18 DIAGNOSIS — R2689 Other abnormalities of gait and mobility: Secondary | ICD-10-CM | POA: Diagnosis not present

## 2021-05-18 DIAGNOSIS — R2681 Unsteadiness on feet: Secondary | ICD-10-CM | POA: Diagnosis not present

## 2021-05-18 DIAGNOSIS — G20 Parkinson's disease: Secondary | ICD-10-CM | POA: Diagnosis not present

## 2021-05-19 DIAGNOSIS — R2681 Unsteadiness on feet: Secondary | ICD-10-CM | POA: Diagnosis not present

## 2021-05-19 DIAGNOSIS — M6281 Muscle weakness (generalized): Secondary | ICD-10-CM | POA: Diagnosis not present

## 2021-05-19 DIAGNOSIS — R55 Syncope and collapse: Secondary | ICD-10-CM | POA: Diagnosis not present

## 2021-05-19 DIAGNOSIS — G20 Parkinson's disease: Secondary | ICD-10-CM | POA: Diagnosis not present

## 2021-05-19 DIAGNOSIS — R2689 Other abnormalities of gait and mobility: Secondary | ICD-10-CM | POA: Diagnosis not present

## 2021-05-20 ENCOUNTER — Other Ambulatory Visit: Payer: Self-pay | Admitting: Adult Health

## 2021-05-20 DIAGNOSIS — I951 Orthostatic hypotension: Secondary | ICD-10-CM

## 2021-05-21 DIAGNOSIS — M6281 Muscle weakness (generalized): Secondary | ICD-10-CM | POA: Diagnosis not present

## 2021-05-21 DIAGNOSIS — R55 Syncope and collapse: Secondary | ICD-10-CM | POA: Diagnosis not present

## 2021-05-24 ENCOUNTER — Other Ambulatory Visit (HOSPITAL_COMMUNITY): Payer: Self-pay | Admitting: Psychiatry

## 2021-05-24 DIAGNOSIS — R2689 Other abnormalities of gait and mobility: Secondary | ICD-10-CM | POA: Diagnosis not present

## 2021-05-24 DIAGNOSIS — R2681 Unsteadiness on feet: Secondary | ICD-10-CM | POA: Diagnosis not present

## 2021-05-24 DIAGNOSIS — G20 Parkinson's disease: Secondary | ICD-10-CM | POA: Diagnosis not present

## 2021-05-24 DIAGNOSIS — F332 Major depressive disorder, recurrent severe without psychotic features: Secondary | ICD-10-CM

## 2021-05-24 DIAGNOSIS — M6281 Muscle weakness (generalized): Secondary | ICD-10-CM | POA: Diagnosis not present

## 2021-05-25 DIAGNOSIS — M6281 Muscle weakness (generalized): Secondary | ICD-10-CM | POA: Diagnosis not present

## 2021-05-25 DIAGNOSIS — R55 Syncope and collapse: Secondary | ICD-10-CM | POA: Diagnosis not present

## 2021-05-26 DIAGNOSIS — R2681 Unsteadiness on feet: Secondary | ICD-10-CM | POA: Diagnosis not present

## 2021-05-26 DIAGNOSIS — G20 Parkinson's disease: Secondary | ICD-10-CM | POA: Diagnosis not present

## 2021-05-26 DIAGNOSIS — M6281 Muscle weakness (generalized): Secondary | ICD-10-CM | POA: Diagnosis not present

## 2021-05-26 DIAGNOSIS — R2689 Other abnormalities of gait and mobility: Secondary | ICD-10-CM | POA: Diagnosis not present

## 2021-05-27 DIAGNOSIS — M6281 Muscle weakness (generalized): Secondary | ICD-10-CM | POA: Diagnosis not present

## 2021-05-27 DIAGNOSIS — R55 Syncope and collapse: Secondary | ICD-10-CM | POA: Diagnosis not present

## 2021-05-28 DIAGNOSIS — R55 Syncope and collapse: Secondary | ICD-10-CM | POA: Diagnosis not present

## 2021-05-28 DIAGNOSIS — M6281 Muscle weakness (generalized): Secondary | ICD-10-CM | POA: Diagnosis not present

## 2021-06-01 DIAGNOSIS — R55 Syncope and collapse: Secondary | ICD-10-CM | POA: Diagnosis not present

## 2021-06-01 DIAGNOSIS — M6281 Muscle weakness (generalized): Secondary | ICD-10-CM | POA: Diagnosis not present

## 2021-06-02 DIAGNOSIS — M6281 Muscle weakness (generalized): Secondary | ICD-10-CM | POA: Diagnosis not present

## 2021-06-02 DIAGNOSIS — R2681 Unsteadiness on feet: Secondary | ICD-10-CM | POA: Diagnosis not present

## 2021-06-02 DIAGNOSIS — G20 Parkinson's disease: Secondary | ICD-10-CM | POA: Diagnosis not present

## 2021-06-02 DIAGNOSIS — R55 Syncope and collapse: Secondary | ICD-10-CM | POA: Diagnosis not present

## 2021-06-02 DIAGNOSIS — R2689 Other abnormalities of gait and mobility: Secondary | ICD-10-CM | POA: Diagnosis not present

## 2021-06-03 DIAGNOSIS — G20 Parkinson's disease: Secondary | ICD-10-CM | POA: Diagnosis not present

## 2021-06-03 DIAGNOSIS — R2689 Other abnormalities of gait and mobility: Secondary | ICD-10-CM | POA: Diagnosis not present

## 2021-06-03 DIAGNOSIS — R2681 Unsteadiness on feet: Secondary | ICD-10-CM | POA: Diagnosis not present

## 2021-06-03 DIAGNOSIS — R55 Syncope and collapse: Secondary | ICD-10-CM | POA: Diagnosis not present

## 2021-06-03 DIAGNOSIS — M6281 Muscle weakness (generalized): Secondary | ICD-10-CM | POA: Diagnosis not present

## 2021-06-04 DIAGNOSIS — R55 Syncope and collapse: Secondary | ICD-10-CM | POA: Diagnosis not present

## 2021-06-04 DIAGNOSIS — M6281 Muscle weakness (generalized): Secondary | ICD-10-CM | POA: Diagnosis not present

## 2021-06-05 DIAGNOSIS — R2689 Other abnormalities of gait and mobility: Secondary | ICD-10-CM | POA: Diagnosis not present

## 2021-06-05 DIAGNOSIS — R2681 Unsteadiness on feet: Secondary | ICD-10-CM | POA: Diagnosis not present

## 2021-06-05 DIAGNOSIS — G20 Parkinson's disease: Secondary | ICD-10-CM | POA: Diagnosis not present

## 2021-06-05 DIAGNOSIS — M6281 Muscle weakness (generalized): Secondary | ICD-10-CM | POA: Diagnosis not present

## 2021-06-07 DIAGNOSIS — M6281 Muscle weakness (generalized): Secondary | ICD-10-CM | POA: Diagnosis not present

## 2021-06-07 DIAGNOSIS — R55 Syncope and collapse: Secondary | ICD-10-CM | POA: Diagnosis not present

## 2021-06-08 ENCOUNTER — Telehealth (HOSPITAL_BASED_OUTPATIENT_CLINIC_OR_DEPARTMENT_OTHER): Payer: Medicare HMO | Admitting: Psychiatry

## 2021-06-08 ENCOUNTER — Encounter (HOSPITAL_COMMUNITY): Payer: Self-pay | Admitting: Psychiatry

## 2021-06-08 ENCOUNTER — Other Ambulatory Visit: Payer: Self-pay

## 2021-06-08 VITALS — Wt 216.0 lb

## 2021-06-08 DIAGNOSIS — F332 Major depressive disorder, recurrent severe without psychotic features: Secondary | ICD-10-CM | POA: Diagnosis not present

## 2021-06-08 DIAGNOSIS — F319 Bipolar disorder, unspecified: Secondary | ICD-10-CM

## 2021-06-08 DIAGNOSIS — F419 Anxiety disorder, unspecified: Secondary | ICD-10-CM | POA: Diagnosis not present

## 2021-06-08 MED ORDER — RISPERIDONE 0.5 MG PO TABS: 0.5000 mg | ORAL_TABLET | Freq: Every day | ORAL | 2 refills | Status: DC

## 2021-06-08 MED ORDER — SERTRALINE HCL 100 MG PO TABS: 100.0000 mg | ORAL_TABLET | Freq: Every day | ORAL | 2 refills | Status: DC

## 2021-06-08 NOTE — Progress Notes (Signed)
Virtual Visit via Telephone Note  I connected with Fernando Carr on 06/08/21 at  9:00 AM EST by telephone and verified that I am speaking with the correct person using two identifiers.  Location: Patient: [facility] Provider: Home Office   I discussed the limitations, risks, security and privacy concerns of performing an evaluation and management service by telephone and the availability of in person appointments. I also discussed with the patient that there may be a patient responsible charge related to this service. The patient expressed understanding and agreed to proceed.   History of Present Illness: Patient is evaluated by phone session.  He was admitted in the hospital in August due to syncopal episodes.  He is now taking midodrine.  Patient reported that he is sad and is stressed about her daughter who now moved to [place] with the 3 kids and he is trying to help her but now he is running short of money.  Patient told his daughter moved to [place] because she was having issues with the children's father but has not able to find a job there.  He is concerned about finances.  However overall he feels things are going okay.  He is sleeping good.  He denies any crying spells, mania, psychosis, hallucination.  He admitted concern about his own physical health as he continues to have tremors and lately he is seeing dentist and his teeth may need to be pulled.  He feels overall tired and weak and getting physical therapy 2-3 times a week.  He feels the physical therapy helping him.  Denies any suicidal thoughts or any anger.  He like living in [place] and he is happy with his living situation.  Denies any panic attack.  He denies any impulsive behavior.  He is seeing his PCP Dr. Milinda Hirschfeld on a regular basis.  His last BUN was 30 and creatinine 1.59 which is slightly improved from the previous readings.  He is comfortable with his current psychotropic medication which is Risperdal and  Zoloft.  Past Psychiatric History:  H/O suicidal attempt by injecting Ethyline Glycol. Had acute renal failure and required dialysis at that time.  H/O paranoia and mania with impulsive buying of expensive cars and selling property when depressed. Tried Abilify and Lexapro.  However he had a good response with Risperdal.    Psychiatric Specialty Exam: Physical Exam  Review of Systems  Weight 216 lb (98 kg).There is no height or weight on file to calculate BMI.  General Appearance: NA  Eye Contact:  NA  Speech:  Slow  Volume:  Decreased  Mood:  Anxious and Dysphoric  Affect:  NA  Thought Process:  Descriptions of Associations: Intact  Orientation:  Full (Time, Place, and Person)  Thought Content:  Rumination  Suicidal Thoughts:  No  Homicidal Thoughts:  No  Memory:  Immediate;   Fair Recent;   Fair Remote;   Fair  Judgement:  Fair  Insight:  Shallow  Psychomotor Activity:  Tremor  Concentration:  Concentration: Fair and Attention Span: Fair  Recall:  [employer] of Knowledge:  Fair  Language:  Fair  Akathisia:  No  Handed:  Right  AIMS (if indicated):     Assets:  Communication Skills Desire for Improvement Housing  ADL's:  Intact  Cognition:  WNL  Sleep:   ok      Assessment and Plan: Bipolar disorder type I.  Anxiety.  I review blood work results.  Creatinine and BUN slightly improved  from the past.  We also talked about family situation as patient is concerned about her daughter who now moved to [place] and he is giving her money.  I discussed in the past about his spending money towards his daughter and that has put him under stress.  Patient admitted and he like to have discussed with her daughter about his financial situation that he cannot continue to support her unless she finds a job.  I also offer therapy but patient feels he is fine and does not need therapy and he does need to discuss with her daughter.  Like to focus on his general health.  He has a plan to  watch football games on Thanksgiving which he usually enjoys a lot.  Patient has no concern from his psychotropic medication.  Continue Risperdal 0.5 mg at bedtime and Zoloft 100 mg daily.  Recommended to call us back if is any question or any concern.  Follow-up in 3 months.  Follow Up Instructions:    I discussed the assessment and treatment plan with the patient. The patient was provided an opportunity to ask questions and all were answered. The patient agreed with the plan and demonstrated an understanding of the instructions.   The patient was advised to call back or seek an in-person evaluation if the symptoms worsen or if the condition fails to improve as anticipated.  I provided 21 minutes of non-face-to-face time during this encounter.   Kathlee Nations, MD

## 2021-06-09 DIAGNOSIS — R55 Syncope and collapse: Secondary | ICD-10-CM | POA: Diagnosis not present

## 2021-06-09 DIAGNOSIS — M6281 Muscle weakness (generalized): Secondary | ICD-10-CM | POA: Diagnosis not present

## 2021-06-10 DIAGNOSIS — M6281 Muscle weakness (generalized): Secondary | ICD-10-CM | POA: Diagnosis not present

## 2021-06-10 DIAGNOSIS — R55 Syncope and collapse: Secondary | ICD-10-CM | POA: Diagnosis not present

## 2021-06-11 DIAGNOSIS — R55 Syncope and collapse: Secondary | ICD-10-CM | POA: Diagnosis not present

## 2021-06-11 DIAGNOSIS — M6281 Muscle weakness (generalized): Secondary | ICD-10-CM | POA: Diagnosis not present

## 2021-06-18 DIAGNOSIS — R2681 Unsteadiness on feet: Secondary | ICD-10-CM | POA: Diagnosis not present

## 2021-06-18 DIAGNOSIS — M6281 Muscle weakness (generalized): Secondary | ICD-10-CM | POA: Diagnosis not present

## 2021-06-18 DIAGNOSIS — G20 Parkinson's disease: Secondary | ICD-10-CM | POA: Diagnosis not present

## 2021-06-18 DIAGNOSIS — R2689 Other abnormalities of gait and mobility: Secondary | ICD-10-CM | POA: Diagnosis not present

## 2021-06-22 DIAGNOSIS — G20 Parkinson's disease: Secondary | ICD-10-CM | POA: Diagnosis not present

## 2021-06-22 DIAGNOSIS — R2689 Other abnormalities of gait and mobility: Secondary | ICD-10-CM | POA: Diagnosis not present

## 2021-06-22 DIAGNOSIS — R2681 Unsteadiness on feet: Secondary | ICD-10-CM | POA: Diagnosis not present

## 2021-06-22 DIAGNOSIS — M6281 Muscle weakness (generalized): Secondary | ICD-10-CM | POA: Diagnosis not present

## 2021-07-01 DIAGNOSIS — M6281 Muscle weakness (generalized): Secondary | ICD-10-CM | POA: Diagnosis not present

## 2021-07-01 DIAGNOSIS — R2681 Unsteadiness on feet: Secondary | ICD-10-CM | POA: Diagnosis not present

## 2021-07-01 DIAGNOSIS — R296 Repeated falls: Secondary | ICD-10-CM | POA: Diagnosis not present

## 2021-07-01 DIAGNOSIS — G20 Parkinson's disease: Secondary | ICD-10-CM | POA: Diagnosis not present
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
## 2021-07-02 ENCOUNTER — Ambulatory Visit: Payer: Medicare HMO | Admitting: Cardiology

## 2021-07-02 ENCOUNTER — Other Ambulatory Visit: Payer: Self-pay

## 2021-07-02 DIAGNOSIS — N1831 Chronic kidney disease, stage 3a: Secondary | ICD-10-CM

## 2021-07-02 DIAGNOSIS — I517 Cardiomegaly: Secondary | ICD-10-CM

## 2021-07-02 DIAGNOSIS — I10 Essential (primary) hypertension: Secondary | ICD-10-CM

## 2021-07-07 DIAGNOSIS — G20 Parkinson's disease: Secondary | ICD-10-CM | POA: Diagnosis not present

## 2021-07-07 DIAGNOSIS — R296 Repeated falls: Secondary | ICD-10-CM | POA: Diagnosis not present

## 2021-07-07 DIAGNOSIS — R2681 Unsteadiness on feet: Secondary | ICD-10-CM | POA: Diagnosis not present

## 2021-07-07 DIAGNOSIS — M6281 Muscle weakness (generalized): Secondary | ICD-10-CM | POA: Diagnosis not present

## 2021-07-09 DIAGNOSIS — M6281 Muscle weakness (generalized): Secondary | ICD-10-CM | POA: Diagnosis not present

## 2021-07-09 DIAGNOSIS — R296 Repeated falls: Secondary | ICD-10-CM | POA: Diagnosis not present

## 2021-07-09 DIAGNOSIS — G20 Parkinson's disease: Secondary | ICD-10-CM | POA: Diagnosis not present

## 2021-07-09 DIAGNOSIS — R2681 Unsteadiness on feet: Secondary | ICD-10-CM | POA: Diagnosis not present

## 2021-07-12 DIAGNOSIS — M6281 Muscle weakness (generalized): Secondary | ICD-10-CM | POA: Diagnosis not present

## 2021-07-12 DIAGNOSIS — R488 Other symbolic dysfunctions: Secondary | ICD-10-CM | POA: Diagnosis not present

## 2021-07-12 DIAGNOSIS — G20 Parkinson's disease: Secondary | ICD-10-CM | POA: Diagnosis not present

## 2021-07-12 DIAGNOSIS — R2681 Unsteadiness on feet: Secondary | ICD-10-CM | POA: Diagnosis not present

## 2021-07-12 DIAGNOSIS — R296 Repeated falls: Secondary | ICD-10-CM | POA: Diagnosis not present

## 2021-07-12 DIAGNOSIS — R41841 Cognitive communication deficit: Secondary | ICD-10-CM | POA: Diagnosis not present

## 2021-07-14 DIAGNOSIS — G20 Parkinson's disease: Secondary | ICD-10-CM | POA: Diagnosis not present

## 2021-07-14 DIAGNOSIS — R488 Other symbolic dysfunctions: Secondary | ICD-10-CM | POA: Diagnosis not present

## 2021-07-14 DIAGNOSIS — R296 Repeated falls: Secondary | ICD-10-CM | POA: Diagnosis not present

## 2021-07-14 DIAGNOSIS — M6281 Muscle weakness (generalized): Secondary | ICD-10-CM | POA: Diagnosis not present

## 2021-07-14 DIAGNOSIS — R2681 Unsteadiness on feet: Secondary | ICD-10-CM | POA: Diagnosis not present

## 2021-07-14 DIAGNOSIS — R41841 Cognitive communication deficit: Secondary | ICD-10-CM | POA: Diagnosis not present

## 2021-07-19 ENCOUNTER — Telehealth (HOSPITAL_COMMUNITY): Payer: Self-pay | Admitting: *Deleted

## 2021-07-19 DIAGNOSIS — R41841 Cognitive communication deficit: Secondary | ICD-10-CM | POA: Diagnosis not present

## 2021-07-19 DIAGNOSIS — R488 Other symbolic dysfunctions: Secondary | ICD-10-CM | POA: Diagnosis not present

## 2021-07-19 DIAGNOSIS — R296 Repeated falls: Secondary | ICD-10-CM | POA: Diagnosis not present

## 2021-07-19 DIAGNOSIS — G20 Parkinson's disease: Secondary | ICD-10-CM | POA: Diagnosis not present

## 2021-07-19 DIAGNOSIS — R2681 Unsteadiness on feet: Secondary | ICD-10-CM | POA: Diagnosis not present

## 2021-07-19 DIAGNOSIS — M6281 Muscle weakness (generalized): Secondary | ICD-10-CM | POA: Diagnosis not present

## 2021-07-19 NOTE — Telephone Encounter (Signed)
Writer received t/c from pt CM, Fernando Carr,  with [employer] that she is concerned about current state of mental health. Fernando Carr says that pt has been refusing his medications, minimally eating, and becoming increasingly depressed. Pt does have a h/o of suicide attempt by drinking antifreeze and CM afraid pt may be hoarding his meds to overdose. Pt mental health and physical health are declining according to CM/SW. Pt not overtly suicidal but makes many statements about not wanting to be here, and that nobody would care if he dies. [name] but says he needs nursing care and a higher level of care. However daughter is "draining" pt fiances and she is worked he may have no where to go. CM also feels pt needs to be seen by psychiatrist in person as with advanced Parkinson's pt is unable to hold phone steady enough. Writer discussed IVC however pt and CM don't feel that [facility] is the place for him as he needs geropsychiatric unit. Writer advised pt should be seen at [facility]. Address and phone number provided.

## 2021-07-20 DIAGNOSIS — R2681 Unsteadiness on feet: Secondary | ICD-10-CM | POA: Diagnosis not present

## 2021-07-20 DIAGNOSIS — M6281 Muscle weakness (generalized): Secondary | ICD-10-CM | POA: Diagnosis not present

## 2021-07-20 DIAGNOSIS — R296 Repeated falls: Secondary | ICD-10-CM | POA: Diagnosis not present

## 2021-07-20 DIAGNOSIS — G20 Parkinson's disease: Secondary | ICD-10-CM | POA: Diagnosis not present

## 2021-07-20 DIAGNOSIS — Z20822 Contact with and (suspected) exposure to covid-19: Secondary | ICD-10-CM | POA: Diagnosis not present

## 2021-07-21 ENCOUNTER — Other Ambulatory Visit: Payer: Self-pay

## 2021-07-21 ENCOUNTER — Emergency Department (HOSPITAL_COMMUNITY)
Admission: EM | Admit: 2021-07-21 | Discharge: 2021-07-30 | Disposition: A | Discharge status: Home / Self Care | Payer: Medicare HMO | Attending: Emergency Medicine | Admitting: Emergency Medicine

## 2021-07-21 ENCOUNTER — Encounter (HOSPITAL_COMMUNITY): Payer: Self-pay | Admitting: *Deleted

## 2021-07-21 ENCOUNTER — Ambulatory Visit (HOSPITAL_COMMUNITY)
Admission: EM | Admit: 2021-07-21 | Discharge: 2021-07-21 | Disposition: A | Discharge status: Other Acute Inpatient Hospital | Payer: Medicare HMO

## 2021-07-21 ENCOUNTER — Emergency Department (HOSPITAL_COMMUNITY): Payer: Medicare HMO

## 2021-07-21 DIAGNOSIS — Z7982 Long term (current) use of aspirin: Secondary | ICD-10-CM | POA: Diagnosis not present

## 2021-07-21 DIAGNOSIS — F329 Major depressive disorder, single episode, unspecified: Secondary | ICD-10-CM | POA: Diagnosis not present

## 2021-07-21 DIAGNOSIS — R41841 Cognitive communication deficit: Secondary | ICD-10-CM | POA: Diagnosis not present

## 2021-07-21 DIAGNOSIS — Z20822 Contact with and (suspected) exposure to covid-19: Secondary | ICD-10-CM | POA: Insufficient documentation

## 2021-07-21 DIAGNOSIS — F99 Mental disorder, not otherwise specified: Secondary | ICD-10-CM

## 2021-07-21 DIAGNOSIS — N189 Chronic kidney disease, unspecified: Secondary | ICD-10-CM | POA: Diagnosis not present

## 2021-07-21 DIAGNOSIS — F332 Major depressive disorder, recurrent severe without psychotic features: Secondary | ICD-10-CM | POA: Diagnosis present

## 2021-07-21 DIAGNOSIS — Z79899 Other long term (current) drug therapy: Secondary | ICD-10-CM | POA: Insufficient documentation

## 2021-07-21 DIAGNOSIS — R4182 Altered mental status, unspecified: Secondary | ICD-10-CM | POA: Diagnosis not present

## 2021-07-21 DIAGNOSIS — F319 Bipolar disorder, unspecified: Secondary | ICD-10-CM | POA: Diagnosis present

## 2021-07-21 DIAGNOSIS — R410 Disorientation, unspecified: Secondary | ICD-10-CM

## 2021-07-21 DIAGNOSIS — G20 Parkinson's disease: Secondary | ICD-10-CM | POA: Diagnosis not present

## 2021-07-21 DIAGNOSIS — F17210 Nicotine dependence, cigarettes, uncomplicated: Secondary | ICD-10-CM | POA: Insufficient documentation

## 2021-07-21 DIAGNOSIS — Z96649 Presence of unspecified artificial hip joint: Secondary | ICD-10-CM | POA: Diagnosis not present

## 2021-07-21 DIAGNOSIS — R9431 Abnormal electrocardiogram [ECG] [EKG]: Secondary | ICD-10-CM | POA: Diagnosis not present

## 2021-07-21 DIAGNOSIS — R488 Other symbolic dysfunctions: Secondary | ICD-10-CM | POA: Diagnosis not present

## 2021-07-21 DIAGNOSIS — F333 Major depressive disorder, recurrent, severe with psychotic symptoms: Secondary | ICD-10-CM | POA: Diagnosis not present

## 2021-07-21 LAB — CBC WITH DIFFERENTIAL/PLATELET
Abs Immature Granulocytes: 0.09 10*3/uL — ABNORMAL HIGH (ref 0.00–0.07)
Basophils Absolute: 0 10*3/uL (ref 0.0–0.1)
Basophils Relative: 0 %
Eosinophils Absolute: 0 10*3/uL (ref 0.0–0.5)
Eosinophils Relative: 0 %
HCT: 35.4 % — ABNORMAL LOW (ref 39.0–52.0)
Hemoglobin: 11 g/dL — ABNORMAL LOW (ref 13.0–17.0)
Immature Granulocytes: 1 %
Lymphocytes Relative: 16 %
Lymphs Abs: 1.3 10*3/uL (ref 0.7–4.0)
MCH: 28.4 pg (ref 26.0–34.0)
MCHC: 31.1 g/dL (ref 30.0–36.0)
MCV: 91.5 fL (ref 80.0–100.0)
Monocytes Absolute: 0.8 10*3/uL (ref 0.1–1.0)
Monocytes Relative: 10 %
Neutro Abs: 5.6 10*3/uL (ref 1.7–7.7)
Neutrophils Relative %: 73 %
Platelets: 293 10*3/uL (ref 150–400)
RBC: 3.87 MIL/uL — ABNORMAL LOW (ref 4.22–5.81)
RDW: 12.6 % (ref 11.5–15.5)
WBC: 7.9 10*3/uL (ref 4.0–10.5)
nRBC: 0 % (ref 0.0–0.2)

## 2021-07-21 LAB — COMPREHENSIVE METABOLIC PANEL
ALT: <5 U/L (ref 0–44)
AST: 17 U/L (ref 15–41)
Albumin: 3.3 g/dL — ABNORMAL LOW (ref 3.5–5.0)
Alkaline Phosphatase: 70 U/L (ref 38–126)
Anion gap: 9 (ref 5–15)
BUN: 48 mg/dL — ABNORMAL HIGH (ref 8–23)
CO2: 23 mmol/L (ref 22–32)
Calcium: 10.2 mg/dL (ref 8.9–10.3)
Chloride: 107 mmol/L (ref 98–111)
Creatinine, Ser: 2.62 mg/dL — ABNORMAL HIGH (ref 0.61–1.24)
GFR, Estimated: 24 mL/min — ABNORMAL LOW (ref >60)
Glucose, Bld: 78 mg/dL (ref 70–99)
Potassium: 4.7 mmol/L (ref 3.5–5.1)
Sodium: 139 mmol/L (ref 135–145)
Total Bilirubin: 0.7 mg/dL (ref 0.3–1.2)
Total Protein: 6.8 g/dL (ref 6.5–8.1)

## 2021-07-21 LAB — RESP PANEL BY RT-PCR (FLU A&B, COVID) ARPGX2
Influenza A by PCR: NEGATIVE
Influenza B by PCR: NEGATIVE
SARS Coronavirus 2 by RT PCR: NEGATIVE

## 2021-07-21 LAB — ETHANOL: Alcohol, Ethyl (B): <10 mg/dL (ref <10)

## 2021-07-21 MED ORDER — MIDODRINE HCL 5 MG PO TABS
5.0000 mg | ORAL_TABLET | Freq: Three times a day (TID) | ORAL | Status: DC
  Administered 2021-07-22 – 2021-07-30 (×25): 5 mg via ORAL
  Filled 2021-07-21 (×29): qty 1

## 2021-07-21 MED ORDER — ASPIRIN EC 81 MG PO TBEC
81.0000 mg | DELAYED_RELEASE_TABLET | Freq: Every day | ORAL | Status: DC
  Administered 2021-07-22 – 2021-07-30 (×9): 81 mg via ORAL
  Filled 2021-07-21 (×9): qty 1

## 2021-07-21 MED ORDER — CARBIDOPA-LEVODOPA 25-100 MG PO TABS
1.0000 | ORAL_TABLET | Freq: Three times a day (TID) | ORAL | Status: DC
  Administered 2021-07-22 – 2021-07-30 (×26): 1 via ORAL
  Filled 2021-07-21 (×32): qty 1

## 2021-07-21 MED ORDER — FENOFIBRATE 54 MG PO TABS
54.0000 mg | ORAL_TABLET | Freq: Every day | ORAL | Status: DC
  Administered 2021-07-22 – 2021-07-30 (×10): 54 mg via ORAL
  Filled 2021-07-21 (×11): qty 1

## 2021-07-21 MED ORDER — VITAMIN B-12 1000 MCG PO TABS
1000.0000 ug | ORAL_TABLET | Freq: Every day | ORAL | Status: DC
  Administered 2021-07-22 – 2021-07-30 (×10): 1000 ug via ORAL
  Filled 2021-07-21 (×10): qty 1

## 2021-07-21 MED ORDER — SIMVASTATIN 20 MG PO TABS
10.0000 mg | ORAL_TABLET | Freq: Every day | ORAL | Status: DC
  Administered 2021-07-22 – 2021-07-29 (×8): 10 mg via ORAL
  Filled 2021-07-21 (×8): qty 1

## 2021-07-21 MED ORDER — SENNA 8.6 MG PO TABS
1.0000 | ORAL_TABLET | Freq: Two times a day (BID) | ORAL | Status: DC
  Administered 2021-07-22 – 2021-07-30 (×17): 8.6 mg via ORAL
  Filled 2021-07-21 (×17): qty 1

## 2021-07-21 MED ORDER — BISACODYL 10 MG RE SUPP
10.0000 mg | RECTAL | Status: DC | PRN
  Filled 2021-07-21: qty 1

## 2021-07-21 MED ORDER — RISPERIDONE 0.5 MG PO TABS
0.5000 mg | ORAL_TABLET | Freq: Every day | ORAL | Status: DC
  Administered 2021-07-22 – 2021-07-29 (×9): 0.5 mg via ORAL
  Filled 2021-07-21 (×11): qty 1

## 2021-07-21 MED ORDER — SODIUM CHLORIDE 0.9 % IV BOLUS
1000.0000 mL | Freq: Once | INTRAVENOUS | Status: AC
  Administered 2021-07-21: 1000 mL via INTRAVENOUS

## 2021-07-21 MED ORDER — SERTRALINE HCL 100 MG PO TABS
100.0000 mg | ORAL_TABLET | Freq: Every day | ORAL | Status: DC
  Administered 2021-07-22 – 2021-07-30 (×10): 100 mg via ORAL
  Filled 2021-07-21 (×10): qty 1

## 2021-07-21 NOTE — ED Notes (Signed)
Pt changed into burgundy scrubs
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
## 2021-07-21 NOTE — Progress Notes (Signed)
Inpatient [place] Placement  Pt meets inpatient criteria per Beatriz Stallion, NP. There are no appropriate beds at [facility].   CSW called [place] who advised there is no availability on [place] unit CSW sent a secure chat to: [place], and Caren Griffins, DO for review.   Referral was sent to the following facilities;   Destination Service Provider Address Phone Fax  [place] [place] [address] [phone]  [facility]  [address] [place] [address] [phone]  [place], [place] [address] [phone] [facility]  [place], [place] [address] [phone] [facility]  [place], [place] [address] [phone] [facility]  [place] [address] [place] [address] [phone] [facility]  [place] [place] [address] [phone]  [facility]  [address] [place] [address] [phone] [place]  [address] [place] [address] [phone] [facility]  [address] [place] [address] [phone]  [facility]  [address], [place] [address] [phone] [place]  [address] [place] [address] [phone]  [facility]  [address] [place] [address] [phone]  [facility]  [address] [place] [address] [phone]  [place] [address], [place] [address] [phone]    Situation ongoing,  CSW will follow up.   Benjaman Kindler, MSW, [facility] 07/21/2021  @ 11:53 PM;

## 2021-07-21 NOTE — ED Notes (Signed)
[facility] unable to transport pt, due to high fall risk status.  [employer] called for transport to [facility].

## 2021-07-21 NOTE — ED Provider Notes (Signed)
I assumed care of the patient at 2300.  Briefly the patient is a 80 year old male with a chief complaints of worsening confusion.  Seen in behavioral health urgent care and recommended for inpatient psychiatric therapy.  Unfortunately they were unable to take him there and so sent to the ED for evaluation.  He tells me he has no medical complaints but did fall about a week ago and is complaining of some right-sided rib pain.  We will obtain a laboratory evaluation chest x-ray.   The patient has an AKI.  Per his nursing home report he has been refusing to eat and drink and stopped taking all of his medications.  Seems more likely to be mental health related than medical at this point.  We will give a bolus of IV fluids.  Patient has been able to eat and drink here without issue.  I feel he is medically clear.  Awaiting geriatric psych placement. Awaiting psychiatric placement.   Deno Etienne, DO 07/22/21 [phone]

## 2021-07-21 NOTE — ED Notes (Signed)
Report called to Charge Nurse Carrie/[facility]/ Dr Almyra Free accepting MD.

## 2021-07-21 NOTE — ED Notes (Signed)
Patient transported to X-ray 

## 2021-07-21 NOTE — ED Triage Notes (Signed)
Pt transferred from [facility] for inpatient psychiatric treatment. Pt resides at [facility] independent living. Per pt, he has been needing more assistance in his apartment and help with his medications. Pt has stopped taking all of his medications because "he didn't think they were working."  At present, pt denies SI/HI

## 2021-07-21 NOTE — ED Provider Notes (Addendum)
Behavioral Health Urgent Care Medical Screening Exam  Patient Name: Fernando Carr MRN: [id number] Date of Evaluation: 07/21/21 Chief Complaint:   Diagnosis:  Final diagnoses:  MDD (major depressive disorder), recurrent severe, without psychosis ([place])    History of Present illness: Fernando Carr is a 80 y.o. male. Patient presents voluntarily to [facility] for walk-in assessment.  Patient is accompanied by geriatric case manager, Creta Levin phone number [phone].  Case manager remains present during assessment per patient preference.  [place], prefers "Fernando Carr" states "I do not know what I am doing, I do not know what to think."  He reports recent stressors include situation that involves "they are trying to question whether I am independent or not, I was independent when I got to [facility] 1 year ago."  Patient reports independent living facility is "trying to vote whether or not I can stay in independent living or go to assisted."  Fernando Carr was hospitalized in August 2022, at that time referral was made for patient to transition into an assisted living facility.  Determination was made by patient and independent living facility that he should attempt to return to independent living facility at that time.  Fernando Carr has recently experienced frequent falls and blood pressure drops.  Patient experienced fall at independent living facility 5 days ago, was later found on the floor after undisclosed amount of time had passed.  Primary care provider, Dr. Deforest Hoyles, recommends patient be considered for higher level of care at this time.  Patient has been diagnosed with major depressive disorder as well as bipolar 1 disorder.  He is followed by outpatient psychiatry, Dr. Adele Schilder.  He recently "took some of the medication and did not take others, I started taking them again but I cannot tell any difference."  At this time he has been compliant with medications including Risperdal and Zoloft  for several days.  Fernando Carr has experienced symptoms of depression including decreased appetite and weight loss.  He is uncertain how much weight he has lost.  During the past 2 weeks he went 4 consecutive days without leaving his room for meals, he does not have any food in his room.  He reports decreased interest in self-care.  He endorses passive suicidal ideation, denies plan or intent at this time.  He states "I would be better off not here than here."  He endorses suicidal ideation x 2-3 weeks, he is unable to contract for safety with this Probation officer.  Patient is assessed face-to-face by nurse practitioner.  He is seated in assessment area, no acute distress.  He is alert and oriented, pleasant and cooperative during assessment.   He presents with depressed mood, congruent affect. He denies homicidal ideations.  He endorses history of 1 previous suicide attempt, approximately 17 years ago.  He denies history of nonsuicidal self-harm behaviors.    He has normal speech and behavior.  He denies both auditory and visual hallucinations.  Patient is able to converse coherently with goal-directed thoughts and no distractibility or preoccupation.  He denies paranoia.  Objectively there is no evidence of psychosis/mania or delusional thinking.  Patient currently resides at [facility] independent living facility, he denies access to weapons.  Patient endorses decreased sleep and appetite.  He denies alcohol and substance use.  Patient reports his daughter relocated to [facility] approximately 2 months ago.  Patient offered support and encouragement.  He agrees with treatment plan including inpatient psychiatric admission at this time.  He gives verbal consent  to speak with and offer updates to geriatric case manager, Creta Levin phone number [phone].   Patient verbalizes plan to send personal belongings including cell phone, cell phone charger and wallet with case manager, Creta Levin.   Psychiatric Specialty Exam  Presentation  General Appearance:Disheveled  Eye Contact:Fair  Speech:Normal Rate  Speech Volume:Normal  Handedness:Right   Mood and Affect  Mood:Depressed  Affect:Depressed   Thought Process  Thought Processes:Coherent; Goal Directed; Linear  Descriptions of Associations:Intact  Orientation:Full (Time, Place and Person)  Thought Content:Logical    Hallucinations:None  Ideas of Reference:None  Suicidal Thoughts:Yes, Passive Without Intent; Without Plan  Homicidal Thoughts:No   Sensorium  Memory:Immediate Good; Recent Fair; Remote [place]   Executive Functions  Concentration:Good  Attention Span:Good  [place]  Language:Good   Psychomotor Activity  Psychomotor Activity:Shuffling Gait; Other (comment) (ambulates with walker)   Assets  Assets:Communication Skills; Desire for Improvement; Financial Resources/Insurance   Sleep  Sleep:Fair  Number of hours: No data recorded  Nutritional Assessment (For OBS and FBC admissions only) Has the patient had a weight loss or gain of 10 pounds or more in the last 3 months?: Yes Has the patient had a decrease in food intake/or appetite?: Yes Does the patient have dental problems?: No Does the patient have eating habits or behaviors that may be indicators of an eating disorder including binging or inducing vomiting?: No Has the patient recently lost weight without trying?: 2.0 Has the patient been eating poorly because of a decreased appetite?: 1 Malnutrition Screening Tool Score: 3 Nutritional Assessment Referrals: Refer to Primary Care Provider    Physical Exam: Physical Exam Vitals and nursing note reviewed.  Constitutional:      Appearance: Normal appearance. He is well-developed.  HENT:     Head: Normocephalic and atraumatic.     Nose: Nose normal.  Cardiovascular:     Rate and Rhythm:  Normal rate.  Pulmonary:     Effort: Pulmonary effort is normal.  Musculoskeletal:        General: Normal range of motion.     Cervical back: Normal range of motion.  Skin:    General: Skin is warm and dry.  Neurological:     Mental Status: He is alert and oriented to person, place, and time.     Motor: Tremor present.  Psychiatric:        Attention and Perception: Attention and perception normal.        Mood and Affect: Affect normal. Mood is depressed.        Speech: Speech normal.        Behavior: Behavior is cooperative.        Thought Content: Thought content includes suicidal ideation.        Cognition and Memory: Cognition and memory normal.   Review of Systems  Constitutional: Negative.   HENT: Negative.    Eyes: Negative.   Respiratory: Negative.    Cardiovascular: Negative.   Gastrointestinal: Negative.   Genitourinary: Negative.   Musculoskeletal: Negative.   Skin: Negative.   Neurological:  Positive for tremors.  Endo/Heme/Allergies: Negative.   Psychiatric/Behavioral:  Positive for depression and suicidal ideas.   Blood pressure 98/60, pulse (!) 119, temperature 98.6 F (37 C), temperature source Oral, resp. rate 20, SpO2 (!) 83 %. There is no height or weight on file to calculate BMI.  Musculoskeletal: Strength & Muscle Tone: decreased Gait & Station: unsteady Patient leans: N/A   [facility] MSE Discharge  Disposition for Follow up and Recommendations: Based on my evaluation I certify that psychiatric inpatient services furnished can reasonably be expected to improve the patient's condition which I recommend transfer to an appropriate accepting facility.  Based on my evaluation the patient appears to have an emergency medical condition for which I recommend the patient be transferred to the emergency department for further evaluation.  Patient reviewed with Dr. Serafina Mitchell. Inpatient psychiatric treatment recommended.  Patient will be transferred to [facility]  emergency department, accepted by Dr. Almyra Free, for medical clearance and to await inpatient geriatric psychiatric admission.  Patient remains voluntary at this time. Patient meets criteria for involuntary commitment petition if he refuses inpatient psychiatric treatment.  Lucky Rathke, FNP 07/21/2021, 8:39 PM

## 2021-07-22 ENCOUNTER — Encounter (HOSPITAL_COMMUNITY): Payer: Self-pay

## 2021-07-22 DIAGNOSIS — R9431 Abnormal electrocardiogram [ECG] [EKG]: Secondary | ICD-10-CM | POA: Diagnosis not present

## 2021-07-22 DIAGNOSIS — F329 Major depressive disorder, single episode, unspecified: Secondary | ICD-10-CM | POA: Diagnosis not present

## 2021-07-22 LAB — RAPID URINE DRUG SCREEN, HOSP PERFORMED
Amphetamines: NOT DETECTED
Barbiturates: NOT DETECTED
Benzodiazepines: NOT DETECTED
Cocaine: NOT DETECTED
Opiates: NOT DETECTED
Tetrahydrocannabinol: NOT DETECTED

## 2021-07-22 LAB — BASIC METABOLIC PANEL
Anion gap: 6 (ref 5–15)
BUN: 43 mg/dL — ABNORMAL HIGH (ref 8–23)
CO2: 22 mmol/L (ref 22–32)
Calcium: 9.6 mg/dL (ref 8.9–10.3)
Chloride: 108 mmol/L (ref 98–111)
Creatinine, Ser: 2.09 mg/dL — ABNORMAL HIGH (ref 0.61–1.24)
GFR, Estimated: 31 mL/min — ABNORMAL LOW (ref >60)
Glucose, Bld: 92 mg/dL (ref 70–99)
Potassium: 3.9 mmol/L (ref 3.5–5.1)
Sodium: 136 mmol/L (ref 135–145)

## 2021-07-22 MED ORDER — SODIUM CHLORIDE 0.9 % IV BOLUS
1000.0000 mL | Freq: Once | INTRAVENOUS | Status: AC
  Administered 2021-07-22: 01:00:00 1000 mL via INTRAVENOUS

## 2021-07-22 NOTE — ED Notes (Signed)
Pt up to BR with walker and in NAD

## 2021-07-22 NOTE — ED Notes (Signed)
Ambulatory to bathroom with walker. Then to sit in chair to build strength

## 2021-07-22 NOTE — ED Notes (Signed)
Sitting in chair for dinner. Ambulatory to bathroom with walker. No complaints. Calm, cooperative.

## 2021-07-22 NOTE — Progress Notes (Signed)
CSW update for Gero Placement at [facility]  CSW received a secure chat from [place], [place] who advised that there is availability after speak with the charge nurse on the [place] unit but shared that are currently staffing issues and that pt will be reviewed for tomorrow.  Benjaman Kindler, MSW, LCSWA 07/22/2021 12:08 AM

## 2021-07-22 NOTE — ED Notes (Signed)
Resting with eyes closed. No signs of distress.

## 2021-07-22 NOTE — ED Notes (Signed)
Patient is resting comfortably. 

## 2021-07-22 NOTE — ED Notes (Signed)
Pt walked to bathroom with walker and standby assist

## 2021-07-22 NOTE — Progress Notes (Signed)
Patient has been denied by [facility] and has been faxed out to geri-psych units. Patient meets [place] inpatient criteria per Beatriz Stallion, NP. Patient has been faxed out to the following facilities:    [place] [place] [address] [phone]  [facility]  [name] [place] [address] [phone]  [place], [place] [address] [phone] [facility]  [place], [place] [address] [phone] [facility]  [place], [place] [address] [phone]  [facility]  [address] [place] [address] [phone] [facility]  [place] [place] [address] [phone]  [facility]  [address], [place] [address] [phone] [place]  [address] [place] [address] [phone] [facility]  [address] [place] [address] [phone]  [facility]  [address] [place] [address] [phone] [place]  [address] [place] [address] [phone]  [facility]  [address] [place]  [address] [phone]  [facility]  [address], [place] [address] [phone]  [place] [address] [place] [address] [phone] [place], MSW, LCSW-A  12:07 PM 07/22/2021

## 2021-07-22 NOTE — ED Notes (Signed)
[name] care manager [phone] requesting an update and also has questions about the patients care.

## 2021-07-23 DIAGNOSIS — R410 Disorientation, unspecified: Secondary | ICD-10-CM

## 2021-07-23 NOTE — ED Notes (Signed)
Pt ambulated to the restroom with his walker without incidence.

## 2021-07-23 NOTE — ED Notes (Signed)
Pt is with TTS

## 2021-07-23 NOTE — ED Notes (Signed)
[name] Vaughn (Daughter) of [place] in [place] called and ask if Jammy can call her at [phone]

## 2021-07-23 NOTE — ED Notes (Signed)
Patient is resting comfortably; eating snack

## 2021-07-23 NOTE — ED Notes (Signed)
Pt is sitting at bedside eating breakfast.

## 2021-07-23 NOTE — ED Notes (Signed)
Meal at the bedside
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
## 2021-07-23 NOTE — ED Notes (Signed)
Pt sitting up in recliner chair in NAD

## 2021-07-24 NOTE — Evaluation (Signed)
Physical Therapy Evaluation Patient Details Name: Fernando Carr MRN: [id number] DOB: 27-Aug-1940 Today's Date: 07/24/2021  History of Present Illness  80 y.o. male presents to [facility] ED on 12/21/2022with worsening confusion, refusing medications, poor PO intake, and recent fall. PMH includes bipolar disorder, MDD, HLD, AAA, afib, parkinson's disease.  Clinical Impression  Pt presents to PT with deficits in strength, power, coordination, balance, gait, endurance. Pt is able to transfer and ambulate for short distances with assistance for safety from PT. Pt demonstrates poor use of brakes when transferring with 4 wheeled walker, increasing his falls risk. Pt with brief 5-10 second period of unresponsiveness when seated on walker after ambulation. Pt with eyes open, but unresponsive to verbal or tactile stimulation. Pt then becomes alert shortly after, RN made aware, BP 154/77 upon return to room (although unsure of accuracy due to UE tremors). Pt's history of syncope and lack of caregiver support are concerning for a return home, along with his recent lack of desire to care for himself and recent confusion per reports of nursing staff. PT recommends SNF placement to aide in improving the patient's tolerance for upright mobility and reducing falls risk. Pt will likely benefit from long term transition to a setting with increased supervision.       Recommendations for follow up therapy are one component of a multi-disciplinary discharge planning process, led by the attending physician.  Recommendations may be updated based on patient status, additional functional criteria and insurance authorization.  Follow Up Recommendations Skilled nursing-short term rehab (<3 hours/day)    Assistance Recommended at Discharge Intermittent Supervision/Assistance  Functional Status Assessment Patient has had a recent decline in their functional status and demonstrates the ability to make significant improvements in function  in a reasonable and predictable amount of time.  Equipment Recommendations  None recommended by PT    Recommendations for Other Services       Precautions / Restrictions Precautions Precautions: Fall Precaution Comments: history of syncope, brief period of unresponsiveness on eval Restrictions Weight Bearing Restrictions: No      Mobility  Bed Mobility Overal bed mobility: Needs Assistance Bed Mobility: Supine to Sit;Sit to Supine     Supine to sit: Supervision Sit to supine: Supervision        Transfers Overall transfer level: Needs assistance Equipment used: Rollator (4 wheels) Transfers: Sit to/from Stand Sit to Stand: Supervision                Ambulation/Gait Ambulation/Gait assistance: Min guard Gait Distance (Feet): 50 Feet Assistive device: Rollator (4 wheels) Gait Pattern/deviations: Step-to pattern Gait velocity: reduced Gait velocity interpretation: <1.8 ft/sec, indicate of risk for recurrent falls   General Gait Details: pt with slowed step-to gait, reduced foot clearance bilaterally  Stairs            Wheelchair Mobility    Modified Rankin (Stroke Patients Only)       Balance Overall balance assessment: Needs assistance Sitting-balance support: No upper extremity supported;Feet supported Sitting balance-Leahy Scale: Fair     Standing balance support: Single extremity supported;Reliant on assistive device for balance Standing balance-Leahy Scale: Poor                               Pertinent Vitals/Pain Pain Assessment: No/denies pain    Home Living Family/patient expects to be discharged to:: Other (Comment) ([place] living) Living Arrangements: Alone Available Help at Discharge:  (pt is unable to  identify any caregiver support) Type of Home: Independent living facility Home Access: Level entry       Home Layout: One level Home Equipment: Rollator (4 wheels);BSC/3in1;Shower seat;Grab  bars - tub/shower;Grab bars - toilet;Wheelchair - manual      Prior Function Prior Level of Function : Independent/Modified Independent                     Hand Dominance   Dominant Hand: Right    Extremity/Trunk Assessment   Upper Extremity Assessment Upper Extremity Assessment: RUE deficits/detail;LUE deficits/detail RUE Deficits / Details: BUE resting tremor, grossly 4/5 strength LUE Deficits / Details: BUE resting tremor, grossly 4/5 strength    Lower Extremity Assessment Lower Extremity Assessment: Generalized weakness (grossly 4/5)    Cervical / Trunk Assessment Cervical / Trunk Assessment: Kyphotic  Communication   Communication: No difficulties  Cognition Arousal/Alertness: Awake/alert Behavior During Therapy: Flat affect Overall Cognitive Status: Impaired/Different from baseline Area of Impairment: Memory;Safety/judgement;Awareness                     Memory: Decreased short-term memory   Safety/Judgement: Decreased awareness of deficits Awareness: Emergent   General Comments: pt with poor use of brakes during transfers, reports confusion last night        General Comments General comments (skin integrity, edema, etc.): pt reports mild SOB upon sitting on rollator after ambulating. Pt RR gradually increases and pt becomes unresponsive although eyes remain open for a 5-10 second period. Pt then becomes alert and is able to answer PT questions. By the time of return to pt's bed BP reading 154/77 (although unsure of reliability due to UE tremors). RN made aware.    Exercises     Assessment/Plan    PT Assessment Patient needs continued PT services  PT Problem List Decreased strength;Decreased activity tolerance;Decreased balance;Decreased mobility;Decreased cognition;Decreased coordination;Cardiopulmonary status limiting activity       PT Treatment Interventions DME instruction;Gait training;Functional mobility training;Therapeutic  activities;Therapeutic exercise;Balance training;Neuromuscular re-education;Patient/family education    PT Goals (Current goals can be found in the Care Plan section)  Acute Rehab PT Goals Patient Stated Goal: to return to prior level of function PT Goal Formulation: With patient Time For Goal Achievement: 08/07/21 Potential to Achieve Goals: Fair    Frequency Min 2X/week   Barriers to discharge        Co-evaluation               AM-PAC PT "6 Clicks" Mobility  Outcome Measure Help needed turning from your back to your side while in a flat bed without using bedrails?: A Little Help needed moving from lying on your back to sitting on the side of a flat bed without using bedrails?: A Little Help needed moving to and from a bed to a chair (including a wheelchair)?: A Little Help needed standing up from a chair using your arms (e.g., wheelchair or bedside chair)?: A Little Help needed to walk in hospital room?: A Little Help needed climbing 3-5 steps with a railing? : A Lot 6 Click Score: 17    End of Session   Activity Tolerance: Treatment limited secondary to medical complications (Comment) (5-10 second periods of reduced level of consciousness) Patient left: in bed;with call bell/phone within reach;with bed alarm set Nurse Communication: Mobility status PT Visit Diagnosis: Other abnormalities of gait and mobility (R26.89);Other symptoms and signs involving the nervous system (R29.898)    Time: 5809-9833 PT Time Calculation (min) (ACUTE ONLY): 16 min  Charges:   PT Evaluation $PT Eval Low Complexity: [place], PT, DPT Acute Rehabilitation Pager: [phone] Office [phone]   Zenaida Niece 07/24/2021, 11:32 AM

## 2021-07-24 NOTE — ED Notes (Signed)
Pt has yeast infection in folds of groin. Powder applied.

## 2021-07-24 NOTE — ED Notes (Signed)
Patient repositioned in bed and assisted with television. No other needs expressed at this time.

## 2021-07-24 NOTE — Consult Note (Signed)
Patient was psych cleared yesterday and remains in the ED.  Called geriatric case manager, Creta Levin @ [phone].  Message left to return call to SW, # provided.

## 2021-07-24 NOTE — ED Notes (Signed)
Breakfast tray ordered 

## 2021-07-24 NOTE — ED Notes (Signed)
Patient ate 100% of dinner

## 2021-07-24 NOTE — TOC Initial Note (Addendum)
Transition of Care [facility]) - Initial/Assessment Note    Patient Details  Name: Fernando Carr MRN: [id number] Date of Birth: 1940-11-02  [facility]) CM/SW Contact:    Alfredia Ferguson, LCSW Phone Number: 07/24/2021, 9:31 AM  Clinical Narrative:                 LCSW contacted by charge RN and RN regarding patient being psych cleared yesterday with plan to return to assisted living facility, [facility]. Per [employer] patient is in independent living at the facility and can return to the facility. LCSW informed RN and noted patient reported [name] has the keys to his apartment.   LCSW called [name] who expressed she was incredibly concerned of his psych clearance and reported when he initially came into psych he told the provider he stopped eating and taking his medications to die and feels patient is presenting well to psych to get out of the emergency department. LCSW informed her LCSW has no control over psych disposition. LCSW was informed they would not pick patient up and LCSW noted PTAR in which [name] identified the bill would be another trigger for patient. LCSW informed that he may need memory care ultimately but he has been psych cleared. LCSW provided [name] with patient advocacy's contact information and updated treatment team.  Per treatment team conversation it was noted he was not being checked on overnight due to being independent and had episode of incontinence. Treatment team discussion is ongoing and notes [name] spoke with RN with request to speak with psych provider.   10:02a: Per team discussion if patient does not meet criteria for geri-psych then requested PT/OT eval to see what care needs from a physical standpoint if patient needs physical rehabilitation. [name] will likely need to look into memory care for long-term supports.         Patient Goals and CMS Choice        Expected Discharge Plan  and Services                                                Prior Living Arrangements/Services                       Activities of Daily Living      Permission Sought/Granted                  Emotional Assessment              Admission diagnosis:  medical [place] Patient Active Problem List   Diagnosis Date Noted   Confusion 07/23/2021   Syncope and collapse 03/24/2021   Parkinson's disease ([place])    Resting tremor    Nicotine dependence    Orthostatic hypotension 01/29/2021   Hip fracture ([place]) 08/13/2020   LVH (left ventricular hypertrophy) 11/14/2019   Educated about COVID-19 virus infection 11/14/2019   AKI (acute kidney injury) ([place])    Syncope 09/26/2019   MDD (major depressive disorder), recurrent severe, without psychosis ([place]) 08/20/2014   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   Severe depression (HCC)    Major depressive disorder, recurrent, severe without psychotic features ([place])    AAA (abdominal aortic aneurysm) 07/07/2014   Dehydration 03/10/2014   FTT (failure to thrive) in adult 03/10/2014   Aneurysm of right iliac artery (  [place]) 03/10/2014   Sinus bradycardia 03/10/2014   HTN (hypertension) 03/10/2014   Afib ([place]) 03/09/2014   Hypotension, postural 03/09/2014   CRD (chronic renal disease), stage III ([place]) 01/09/2014   Abdominal aortic aneurysm 12/20/2011   Bipolar 1 disorder ([place]) 10/24/2011   Hyperlipemia 10/10/2011   Hypertriglyceridemia 10/03/2011   Hyperlipidemia 03/24/2011   PCP:  [name], MD Pharmacy:   [id number] [place], [place] [address] [place] [address] Phone: [phone] Fax: [phone]     Social Determinants of Health (SDOH) Interventions    Readmission Risk Interventions No flowsheet data found.

## 2021-07-24 NOTE — Progress Notes (Signed)
Donalda Ewings NT charted under my name Archie Patten  These vitals was not reported to the RN

## 2021-07-24 NOTE — ED Notes (Signed)
Pt lying in urine. Sheets and clothes changed. Cleaned. Breakdown (excoriation noted on outter buttocks cheeks bilaterally). Sacral heart dressing applied.

## 2021-07-25 NOTE — ED Notes (Addendum)
Changed pt's brief and linen. Brief was soiled.

## 2021-07-25 NOTE — NC FL2 (Signed)
[place] LEVEL OF CARE SCREENING TOOL     IDENTIFICATION  Patient Name: Fernando Carr Birthdate: 1940/11/25 Sex: male Admission Date (Current Location): 07/21/2021  [facility] [place] Number:  Herbalist and Address:  The . [facility] [place] [address] [place], [place] [address]      Provider Number: [id number]  Attending Physician Name and Address:  Default, Provider, MD  Relative Name and Phone Number:  Sebasthian Stailey, daughter, [phone]    Current Level of Care: SNF Recommended Level of Care: Other (Comment) (emergency department) Prior Approval Number:    Date Approved/Denied:   PASRR Number: [id number]  Discharge Plan: Other (Comment)    Current Diagnoses: Patient Active Problem List   Diagnosis Date Noted   Confusion 07/23/2021   Syncope and collapse 03/24/2021   Parkinson's disease ([place])    Resting tremor    Nicotine dependence    Orthostatic hypotension 01/29/2021   Hip fracture ([place]) 08/13/2020   LVH (left ventricular hypertrophy) 11/14/2019   Educated about COVID-19 virus infection 11/14/2019   AKI (acute kidney injury) ([place])    Syncope 09/26/2019   MDD (major depressive disorder), recurrent severe, without psychosis ([place]) 08/20/2014   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   Severe depression (HCC)    Major depressive disorder, recurrent, severe without psychotic features ([place])    AAA (abdominal aortic aneurysm) 07/07/2014   Dehydration 03/10/2014   FTT (failure to thrive) in adult 03/10/2014   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   Sinus bradycardia 03/10/2014   HTN (hypertension) 03/10/2014   Afib ([place]) 03/09/2014   Hypotension, postural 03/09/2014   CRD (chronic renal disease), stage III ([place]) 01/09/2014   Abdominal aortic aneurysm 12/20/2011   Bipolar 1 disorder ([place]) 10/24/2011   Hyperlipemia 10/10/2011   Hypertriglyceridemia 10/03/2011   Hyperlipidemia 03/24/2011    Orientation RESPIRATION  BLADDER Height & Weight     Self, Time, Situation, Place  Normal Incontinent Weight: 200 lb (90.7 kg) Height:  6' (182.9 cm)  BEHAVIORAL SYMPTOMS/MOOD NEUROLOGICAL BOWEL NUTRITION STATUS      Incontinent    AMBULATORY STATUS COMMUNICATION OF NEEDS Skin   Limited Assist Verbally Normal                       Personal Care Assistance Level of Assistance  Bathing, Dressing, Feeding Bathing Assistance: Limited assistance Feeding assistance: Independent Dressing Assistance: Limited assistance (standby mod assist for compression garments abdomen)     Functional Limitations Info             SPECIAL CARE FACTORS FREQUENCY  PT (By licensed PT), OT (By licensed OT)     PT Frequency: 5x weekly OT Frequency: 5x weekly            Contractures Contractures Info: Not present    Additional Factors Info  Code Status, Allergies Code Status Info: full Allergies Info: nkda           Current Medications (07/25/2021):  This is the current hospital active medication list Current Facility-Administered Medications  Medication Dose Route Frequency Provider Last Rate Last Admin   aspirin EC tablet 81 mg  81 mg Oral Daily Carlisle Cater, PA-C   81 mg at 07/25/21 8250   bisacodyl (DULCOLAX) suppository 10 mg  10 mg Rectal PRN Carlisle Cater, PA-C       carbidopa-levodopa (SINEMET IR) 25-100 MG per tablet immediate release 1 tablet  1 tablet Oral TID Carlisle Cater, PA-C  1 tablet at 07/25/21 2035   fenofibrate tablet 54 mg  54 mg Oral Daily Carlisle Cater, PA-C   54 mg at 07/25/21 0926   midodrine (PROAMATINE) tablet 5 mg  5 mg Oral TID WC Carlisle Cater, PA-C   5 mg at 07/25/21 0827   risperiDONE (RISPERDAL) tablet 0.5 mg  0.5 mg Oral QHS Carlisle Cater, PA-C   0.5 mg at 07/24/21 2136   senna (SENOKOT) tablet 8.6 mg  1 tablet Oral BID Carlisle Cater, PA-C   8.6 mg at 07/25/21 5974   sertraline (ZOLOFT) tablet 100 mg  100 mg Oral Daily Carlisle Cater, PA-C   100 mg at 07/25/21 1638    simvastatin (ZOCOR) tablet 10 mg  10 mg Oral q1800 Carlisle Cater, PA-C   10 mg at 07/24/21 1810   vitamin B-12 (CYANOCOBALAMIN) tablet 1,000 mcg  1,000 mcg Oral Daily Carlisle Cater, PA-C   1,000 mcg at 07/25/21 4536   Current Outpatient Medications  Medication Sig Dispense Refill   carbidopa-levodopa (SINEMET IR) 25-100 MG tablet Take 1 tablet by mouth 3 (three) times daily. 90 tablet 0   fenofibrate 54 MG tablet Take 1 tablet (54 mg total) by mouth daily. 30 tablet 0   midodrine (PROAMATINE) 5 MG tablet Take 1 tablet (5 mg total) by mouth 3 (three) times daily with meals. (Patient taking differently: Take 5 mg by mouth 2 (two) times daily with a meal.) 90 tablet 0   risperiDONE (RISPERDAL) 0.5 MG tablet Take 1 tablet (0.5 mg total) by mouth at bedtime. 30 tablet 2   sertraline (ZOLOFT) 100 MG tablet Take 1 tablet (100 mg total) by mouth daily. 30 tablet 2   simvastatin (ZOCOR) 10 MG tablet Take 1 tablet (10 mg total) by mouth daily at 6 PM. 30 tablet 0   aspirin 81 MG EC tablet Take 1 tablet (81 mg total) by mouth daily. Swallow whole. (Patient not taking: Reported on 07/21/2021) 30 tablet 0   bisacodyl (DULCOLAX) 10 MG suppository as needed. If not relieved by MOM, give 10 mg Bisacodyl suppositiory rectally X 1 dose in 24 hours as needed (Patient not taking: Reported on 07/21/2021)     magnesium hydroxide (MILK OF MAGNESIA) 400 MG/5ML suspension Take by mouth daily as needed for mild constipation. If no BM in 3 days, give 30 cc Milk of Magnesium p.o. x 1 dose in 24 hours as needed (Do not use standing constipation orders for residents with renal failure CFR less than 30. Contact MD for orders) (Patient not taking: Reported on 07/21/2021)     senna (SENOKOT) 8.6 MG TABS tablet Take 1 tablet (8.6 mg total) by mouth 2 (two) times daily. (Patient not taking: Reported on 07/21/2021) 60 tablet 0   Sodium Phosphates (RA SALINE ENEMA RE) Place rectally. If not relieved by Biscodyl suppository, give  disposable Saline Enema rectally X 1 dose/24 hrs as needed (Patient not taking: Reported on 07/21/2021)     vitamin B-12 (CYANOCOBALAMIN) 1000 MCG tablet Take 1 tablet (1,000 mcg total) by mouth daily. (Patient not taking: Reported on 07/21/2021) 30 tablet 0     Discharge Medications: Please see discharge summary for a list of discharge medications.  Relevant Imaging Results:  Relevant Lab Results:   Additional Information SSN [id number]  [place] COVID-19 Vaccine 10/15/2019 , 09/09/2019  Alfredia Ferguson, LCSW

## 2021-07-25 NOTE — TOC Progression Note (Signed)
Transition of Care [facility]) - Progression Note    Patient Details  Name: Fernando Carr MRN: [id number] Date of Birth: Nov 28, 1940  Transition of Care [facility]) CM/SW [place], [place] Phone Number: 07/25/2021, 10:21 AM  Clinical Narrative:    LCSW saw SNF recommendation by physical therapy. LCSW met with patient to discuss and provided patient with psychoeducation on SNF referrals and treatment. LCSW asked if patient had any additional questions or concerns and noted none. LCSW left HIPPA-compliant voicemails with gero case manager and patient's daughter.    Expected Discharge Plan: Skilled Nursing Facility Barriers to Discharge: SNF Pending bed offer  Expected Discharge Plan and Services Expected Discharge Plan: [place] Choice: [place] arrangements for the past 2 months: [place]                                       Social Determinants of Health (SDOH) Interventions    Readmission Risk Interventions No flowsheet data found.

## 2021-07-25 NOTE — ED Notes (Signed)
Pt ambulated to restroom. 

## 2021-07-25 NOTE — ED Notes (Signed)
Pt sleeping will obtain VS when pt is awake

## 2021-07-25 NOTE — ED Notes (Signed)
Pt ambulatory to restroom using walker.

## 2021-07-25 NOTE — ED Notes (Signed)
Pt ambulated to restroom with walker

## 2021-07-26 MED ORDER — NYSTATIN 100000 UNIT/GM EX POWD
Freq: Three times a day (TID) | CUTANEOUS | Status: DC
  Administered 2021-07-26: 1 via TOPICAL
  Filled 2021-07-26: qty 15

## 2021-07-26 NOTE — ED Notes (Signed)
Pt sitting in recliner chair °

## 2021-07-26 NOTE — ED Notes (Signed)
Pt was incontinent of urine. Cleaned pt and applied a clean brief. 

## 2021-07-26 NOTE — ED Notes (Addendum)
Patient is resting comfortably in bed and in NAD

## 2021-07-26 NOTE — ED Notes (Addendum)
Pt cleaned, changed, new linen provided. Evidence of possible yeast infection noted. MD made aware.

## 2021-07-26 NOTE — ED Notes (Signed)
Breakfast orders placed 

## 2021-07-26 NOTE — ED Notes (Signed)
Pt wanted to get back in bed, I assisted pt back to bed.

## 2021-07-26 NOTE — TOC CM/SW Note (Cosign Needed Addendum)
IB:BCWU [place]  Date of Birth: 02-Dec-1940  Date: 07/26/21  To Whom It May Concern:  Please be advised that the above-named patient will require a short-term nursing home stay - anticipated 30 days or less for rehabilitation and strengthening. The plan is for return home.  MD Signature:_________________________  Date:________________________________

## 2021-07-26 NOTE — Progress Notes (Signed)
Physical Therapy Treatment Patient Details Name: Fernando Carr MRN: [id number] DOB: 1941-03-27 Today's Date: 07/26/2021   History of Present Illness 80 y.o. male presents to [facility] ED on 12/21/2022with worsening confusion, refusing medications, poor PO intake, and recent fall. PMH includes bipolar disorder, MDD, HLD, AAA, afib, parkinson's disease.    PT Comments    Pt progressing towards goals, however, limited secondary to fatigue. Pt had just gotten back in bed, so did not want to attempt OOB mobility. Was agreeable to supine HEP and tolerated well. Current recommendations for SNF appropriate. Will continue to follow acutely.     Recommendations for follow up therapy are one component of a multi-disciplinary discharge planning process, led by the attending physician.  Recommendations may be updated based on patient status, additional functional criteria and insurance authorization.  Follow Up Recommendations  Skilled nursing-short term rehab (<3 hours/day)     Assistance Recommended at Discharge Intermittent Supervision/Assistance  Equipment Recommendations  None recommended by PT    Recommendations for Other Services       Precautions / Restrictions Precautions Precautions: Fall Precaution Comments: history of syncope, brief period of unresponsiveness on eval Restrictions Weight Bearing Restrictions: No     Mobility  Bed Mobility               General bed mobility comments: Pt had just gotten back to bed and did not want to perform OOB mobility, but was agreeable to ther ex    Transfers                        Ambulation/Gait                   Stairs             Wheelchair Mobility    Modified Rankin (Stroke Patients Only)       Balance                                            Cognition Arousal/Alertness: Awake/alert Behavior During Therapy: Flat affect Overall Cognitive Status: Impaired/Different from  baseline Area of Impairment: Memory;Safety/judgement;Awareness                     Memory: Decreased short-term memory   Safety/Judgement: Decreased awareness of deficits Awareness: Emergent            Exercises General Exercises - Upper Extremity Shoulder Flexion: AROM;Both;10 reps;Supine Shoulder Horizontal ABduction: AROM;Both;10 reps;Supine General Exercises - Lower Extremity Ankle Circles/Pumps: AROM;Both;20 reps;Supine Quad Sets: AROM;Both;10 reps;Supine Heel Slides: AROM;Both;10 reps;Supine Hip ABduction/ADduction: AROM;Both;10 reps;Supine Straight Leg Raises: AROM;Both;10 reps;Supine    General Comments        Pertinent Vitals/Pain Pain Assessment: No/denies pain    Home Living                          Prior Function            PT Goals (current goals can now be found in the care plan section) Acute Rehab PT Goals Patient Stated Goal: to return to prior level of function PT Goal Formulation: With patient Time For Goal Achievement: 08/07/21 Potential to Achieve Goals: Fair Progress towards PT goals: Progressing toward goals    Frequency    Min 2X/week      PT Plan Current  plan remains appropriate    Co-evaluation              AM-PAC PT "6 Clicks" Mobility   Outcome Measure  Help needed turning from your back to your side while in a flat bed without using bedrails?: A Little Help needed moving from lying on your back to sitting on the side of a flat bed without using bedrails?: A Little Help needed moving to and from a bed to a chair (including a wheelchair)?: A Little Help needed standing up from a chair using your arms (e.g., wheelchair or bedside chair)?: A Little Help needed to walk in hospital room?: A Lot Help needed climbing 3-5 steps with a railing? : A Lot 6 Click Score: 16    End of Session   Activity Tolerance: Patient limited by fatigue Patient left: in bed;with call bell/phone within reach;with bed  alarm set (in bed in ED) Nurse Communication: Mobility status PT Visit Diagnosis: Other abnormalities of gait and mobility (R26.89);Other symptoms and signs involving the nervous system (R29.898)     Time: 0277-4128 PT Time Calculation (min) (ACUTE ONLY): 10 min  Charges:  $Therapeutic Exercise: 8-22 mins                     [name], DPT  Acute Rehabilitation Services  Pager: [phone] Office: [phone]    [name] 07/26/2021, 4:15 PM

## 2021-07-26 NOTE — NC FL2 (Signed)
[place] LEVEL OF CARE SCREENING TOOL     IDENTIFICATION  Patient Name: Fernando Carr Birthdate: February 10, 1941 Sex: male Admission Date (Current Location): 07/21/2021  [facility] and [place] Number:  Herbalist and Address:  The [place]. [facility], [place] [address] [place], [place] [address]      Provider Number: [id number]  Attending Physician Name and Address:  Default, Provider, MD  Relative Name and Phone Number:  [name], [phone]    Current Level of Care: Hospital Recommended Level of Care: [place], [place] Prior Approval Number:    Date Approved/Denied:   PASRR Number: [id number], Pending new PASRR  Discharge Plan: SNF (Skilled Nursing for rehab. Assisted living for long term care after SNF discharge)    Current Diagnoses: Patient Active Problem List   Diagnosis Date Noted   Confusion 07/23/2021   Syncope and collapse 03/24/2021   Parkinson's disease ([place])    Resting tremor    Nicotine dependence    Orthostatic hypotension 01/29/2021   Hip fracture ([place]) 08/13/2020   LVH (left ventricular hypertrophy) 11/14/2019   Educated about COVID-19 virus infection 11/14/2019   AKI (acute kidney injury) ([place])    Syncope 09/26/2019   MDD (major depressive disorder), recurrent severe, without psychosis ([place]) 08/20/2014   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   Severe depression (HCC)    Major depressive disorder, recurrent, severe without psychotic features ([place])    AAA (abdominal aortic aneurysm) 07/07/2014   Dehydration 03/10/2014   FTT (failure to thrive) in adult 03/10/2014   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   Sinus bradycardia 03/10/2014   HTN (hypertension) 03/10/2014   Afib ([place]) 03/09/2014   Hypotension, postural 03/09/2014   CRD (chronic renal disease), stage III ([place]) 01/09/2014   Abdominal aortic aneurysm 12/20/2011   Bipolar 1 disorder ([place]) 10/24/2011   Hyperlipemia  10/10/2011   Hypertriglyceridemia 10/03/2011   Hyperlipidemia 03/24/2011    Orientation RESPIRATION BLADDER Height & Weight     Self, Time, Situation, Place  Normal Incontinent Weight: 200 lb (90.7 kg) Height:  6' (182.9 cm)  BEHAVIORAL SYMPTOMS/MOOD NEUROLOGICAL BOWEL NUTRITION STATUS      Continent    AMBULATORY STATUS COMMUNICATION OF NEEDS Skin   Limited Assist Verbally Normal                       Personal Care Assistance Level of Assistance  Bathing, Dressing, Feeding Bathing Assistance: Limited assistance Feeding assistance: Independent Dressing Assistance: Limited assistance     Functional Limitations Info  Sight, Hearing, Speech Sight Info: Adequate Hearing Info: Adequate Speech Info: Adequate    SPECIAL CARE FACTORS FREQUENCY  PT (By licensed PT)     PT Frequency: Min 2X week OT Frequency: 5x weekly            Contractures Contractures Info: Not present    Additional Factors Info  Code Status, Allergies Code Status Info: Full Allergies Info: No known Allergies limited           Current Medications (07/26/2021):  This is the current hospital active medication list Current Facility-Administered Medications  Medication Dose Route Frequency Provider Last Rate Last Admin   aspirin EC tablet 81 mg  81 mg Oral Daily Carlisle Cater, PA-C   81 mg at 07/26/21 0945   bisacodyl (DULCOLAX) suppository 10 mg  10 mg Rectal PRN Carlisle Cater, PA-C       carbidopa-levodopa (SINEMET IR) 25-100 MG per tablet immediate  release 1 tablet  1 tablet Oral TID Carlisle Cater, PA-C   1 tablet at 07/26/21 3419   fenofibrate tablet 54 mg  54 mg Oral Daily Carlisle Cater, PA-C   54 mg at 07/26/21 0946   midodrine (PROAMATINE) tablet 5 mg  5 mg Oral TID WC Carlisle Cater, PA-C   5 mg at 07/26/21 3790   nystatin (MYCOSTATIN/NYSTOP) topical powder   Topical TID Orpah Greek, MD       risperiDONE (RISPERDAL) tablet 0.5 mg  0.5 mg Oral QHS Carlisle Cater, PA-C   0.5  mg at 07/25/21 2154   senna (SENOKOT) tablet 8.6 mg  1 tablet Oral BID Carlisle Cater, PA-C   8.6 mg at 07/26/21 0945   sertraline (ZOLOFT) tablet 100 mg  100 mg Oral Daily Carlisle Cater, PA-C   100 mg at 07/26/21 0944   simvastatin (ZOCOR) tablet 10 mg  10 mg Oral q1800 Carlisle Cater, PA-C   10 mg at 07/25/21 1616   vitamin B-12 (CYANOCOBALAMIN) tablet 1,000 mcg  1,000 mcg Oral Daily Carlisle Cater, PA-C   1,000 mcg at 07/26/21 0945   Current Outpatient Medications  Medication Sig Dispense Refill   carbidopa-levodopa (SINEMET IR) 25-100 MG tablet Take 1 tablet by mouth 3 (three) times daily. 90 tablet 0   fenofibrate 54 MG tablet Take 1 tablet (54 mg total) by mouth daily. 30 tablet 0   midodrine (PROAMATINE) 5 MG tablet Take 1 tablet (5 mg total) by mouth 3 (three) times daily with meals. (Patient taking differently: Take 5 mg by mouth 2 (two) times daily with a meal.) 90 tablet 0   risperiDONE (RISPERDAL) 0.5 MG tablet Take 1 tablet (0.5 mg total) by mouth at bedtime. 30 tablet 2   sertraline (ZOLOFT) 100 MG tablet Take 1 tablet (100 mg total) by mouth daily. 30 tablet 2   simvastatin (ZOCOR) 10 MG tablet Take 1 tablet (10 mg total) by mouth daily at 6 PM. 30 tablet 0   aspirin 81 MG EC tablet Take 1 tablet (81 mg total) by mouth daily. Swallow whole. (Patient not taking: Reported on 07/21/2021) 30 tablet 0   bisacodyl (DULCOLAX) 10 MG suppository as needed. If not relieved by MOM, give 10 mg Bisacodyl suppositiory rectally X 1 dose in 24 hours as needed (Patient not taking: Reported on 07/21/2021)     magnesium hydroxide (MILK OF MAGNESIA) 400 MG/5ML suspension Take by mouth daily as needed for mild constipation. If no BM in 3 days, give 30 cc Milk of Magnesium p.o. x 1 dose in 24 hours as needed (Do not use standing constipation orders for residents with renal failure CFR less than 30. Contact MD for orders) (Patient not taking: Reported on 07/21/2021)     senna (SENOKOT) 8.6 MG TABS tablet  Take 1 tablet (8.6 mg total) by mouth 2 (two) times daily. (Patient not taking: Reported on 07/21/2021) 60 tablet 0   Sodium Phosphates (RA SALINE ENEMA RE) Place rectally. If not relieved by Biscodyl suppository, give disposable Saline Enema rectally X 1 dose/24 hrs as needed (Patient not taking: Reported on 07/21/2021)     vitamin B-12 (CYANOCOBALAMIN) 1000 MCG tablet Take 1 tablet (1,000 mcg total) by mouth daily. (Patient not taking: Reported on 07/21/2021) 30 tablet 0     Discharge Medications: Please see discharge summary for a list of discharge medications.  Relevant Imaging Results:  Relevant Lab Results:   Additional Information SSN [id number]  [place] COVID-19 Vaccine 10/15/2019 , 09/09/2019. Patient has  abdominal binder and compression wear lower extremity.  Raina Mina, LCSWA

## 2021-07-27 DIAGNOSIS — R9431 Abnormal electrocardiogram [ECG] [EKG]: Secondary | ICD-10-CM | POA: Diagnosis not present

## 2021-07-27 DIAGNOSIS — F329 Major depressive disorder, single episode, unspecified: Secondary | ICD-10-CM | POA: Diagnosis not present

## 2021-07-27 NOTE — ED Notes (Signed)
Allena Katz sister [phone] requesting to speak to the patient

## 2021-07-27 NOTE — ED Notes (Signed)
Went in to medicate this pt and noticed pt has an external urinary drainage system in place. Upon assessment, area was clean, dry and intact.

## 2021-07-27 NOTE — Progress Notes (Signed)
Patient has been denied at 62 SNF placements due to concerns of psych behaviors. CSW contacted the following facilities to see if they would be willing to take patient for rehab.  [facility]- Will review [facility]- No beds [facility]- No beds [facility]- Left message  [facility]- Will review    CSW also contacted patients case manager [name] to prepare for patient discharging home due to the SNF denials. Anderson Malta stated she has [facility] looking at patient for assisted living. Anderson Malta stated once she gets back to her desk today she will call CSW back.

## 2021-07-27 NOTE — ED Notes (Signed)
Patient is resting comfortably. 

## 2021-07-27 NOTE — ED Provider Notes (Signed)
Emergency Medicine Observation Re-evaluation Note  Fernando Carr is a 80 y.o. male, seen on rounds today.  Pt initially presented to the ED for complaints of Medical Clearance Currently, the patient is resting comfortably.  Physical Exam  BP (!) 149/75 (BP Location: Left Arm)    Pulse 64    Temp 97.7 F (36.5 C) (Oral)    Resp 20    Ht 6' (1.829 m)    Wt 90.7 kg    SpO2 96%    BMI 27.12 kg/m  Physical Exam General: Awake and alert Lungs: Resp even and unlabored Psych: Calm and cooperative  ED Course / MDM  EKG:EKG Interpretation  Date/Time:  Wednesday July 21 2021 22:31:52 EST Ventricular Rate:  67 PR Interval:  198 QRS Duration: 88 QT Interval:  386 QTC Calculation: 407 R Axis:   -15 Text Interpretation: Sinus rhythm with Premature atrial complexes Minimal voltage criteria for LVH, may be normal variant ( R in aVL ) Nonspecific ST abnormality Abnormal ECG No significant change since last tracing Confirmed by Deno Etienne [phone]) on 07/21/2021 11:22:15 PM  I have reviewed the labs performed to date as well as medications administered while in observation.  Recent changes in the last 24 hours include none.  Plan  Current plan is for Psych cleared. Now working with [facility] looking for SNF placement. Agree with Social Worker to attempt placement in SNF  Fernando Carr is not under involuntary commitment.     Truddie Hidden, MD 07/27/21 [phone]

## 2021-07-27 NOTE — Progress Notes (Signed)
CSW received a call back from [place] with admissions at [facility] and patient has been denied for SNF.

## 2021-07-27 NOTE — TOC CM/SW Note (Cosign Needed)
[id number] Fernando Carr   Date of Birth: 10/18/1940   Date: 07/26/21   To Whom It May Concern:   Please be advised that the above-named patient will require a short-term nursing home stay - anticipated 30 days or less for rehabilitation and strengthening. The plan is for return home.

## 2021-07-27 NOTE — Progress Notes (Signed)
Patient denied at [facility] due to suicidal ideations that's listed on patients encounter diagnosis. [name] stated patient would have to have a sitter and be watched and this is probably why patient has been denied at multiple SNF's

## 2021-07-27 NOTE — Progress Notes (Signed)
[facility] and [facility] SNF's denied patient for placement. CSW left VM for admissions at [facility] in [place]

## 2021-07-27 NOTE — ED Notes (Signed)
Patient given a snack.

## 2021-07-27 NOTE — NC FL2 (Signed)
[place] LEVEL OF CARE SCREENING TOOL     IDENTIFICATION  Patient Name: Fernando Carr Birthdate: 1941/04/21 Sex: male Admission Date (Current Location): 07/21/2021  [facility] and [place] Number:  Herbalist and Address:  The [place]. [facility], [place] [address], [place], [place] [address]      Provider Number: [id number]  Attending Physician Name and Address:  Truddie Hidden, MD  Relative Name and Phone Number:  [name], [phone]    Current Level of Care: Hospital Recommended Level of Care: [place] Prior Approval Number:    Date Approved/Denied:   PASRR Number: Pending  Discharge Plan: SNF    Current Diagnoses: Patient Active Problem List   Diagnosis Date Noted   Confusion 07/23/2021   Syncope and collapse 03/24/2021   Parkinson's disease ([place])    Resting tremor    Nicotine dependence    Orthostatic hypotension 01/29/2021   Hip fracture ([place]) 08/13/2020   LVH (left ventricular hypertrophy) 11/14/2019   Educated about COVID-19 virus infection 11/14/2019   AKI (acute kidney injury) ([place])    Syncope 09/26/2019   MDD (major depressive disorder), recurrent severe, without psychosis ([place]) 08/20/2014   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   Severe depression ([place])    Major depressive disorder, recurrent, severe without psychotic features ([place])    AAA (abdominal aortic aneurysm) 07/07/2014   Dehydration 03/10/2014   FTT (failure to thrive) in adult 03/10/2014   Aneurysm of right iliac artery ([place]) 03/10/2014   Sinus bradycardia 03/10/2014   HTN (hypertension) 03/10/2014   Afib ([place]) 03/09/2014   Hypotension, postural 03/09/2014   CRD (chronic renal disease), stage III ([place]) 01/09/2014   Abdominal aortic aneurysm 12/20/2011   Bipolar 1 disorder ([place]) 10/24/2011   Hyperlipemia 10/10/2011   Hypertriglyceridemia 10/03/2011   Hyperlipidemia 03/24/2011    Orientation RESPIRATION BLADDER Height & Weight      Self, Time, Situation, Place  Normal Incontinent Weight: 200 lb (90.7 kg) Height:  6' (182.9 cm)  BEHAVIORAL SYMPTOMS/MOOD NEUROLOGICAL BOWEL NUTRITION STATUS      Continent Diet (Regular)  AMBULATORY STATUS COMMUNICATION OF NEEDS Skin   Limited Assist Verbally Normal                       Personal Care Assistance Level of Assistance  Bathing, Dressing, Feeding Bathing Assistance: Limited assistance Feeding assistance: Independent Dressing Assistance: Limited assistance     Functional Limitations Info  Sight, Hearing, Speech Sight Info: Adequate Hearing Info: Adequate Speech Info: Adequate    SPECIAL CARE FACTORS FREQUENCY  PT (By licensed PT)     PT Frequency: Min 2X week OT Frequency: 5x weekly            Contractures Contractures Info: Not present    Additional Factors Info  Code Status, Allergies Code Status Info: Full Allergies Info: No known allergies           Current Medications (07/27/2021):  This is the current hospital active medication list Current Facility-Administered Medications  Medication Dose Route Frequency Provider Last Rate Last Admin   aspirin EC tablet 81 mg  81 mg Oral Daily Carlisle Cater, PA-C   81 mg at 07/26/21 0945   bisacodyl (DULCOLAX) suppository 10 mg  10 mg Rectal PRN Carlisle Cater, PA-C       carbidopa-levodopa (SINEMET IR) 25-100 MG per tablet immediate release 1 tablet  1 tablet Oral TID Carlisle Cater, PA-C   1 tablet at 07/26/21 2206  fenofibrate tablet 54 mg  54 mg Oral Daily Carlisle Cater, PA-C   54 mg at 07/26/21 0946   midodrine (PROAMATINE) tablet 5 mg  5 mg Oral TID WC Carlisle Cater, PA-C   5 mg at 07/27/21 5093   nystatin (MYCOSTATIN/NYSTOP) topical powder   Topical TID Orpah Greek, MD   1 application at 26/71/24 2208   risperiDONE (RISPERDAL) tablet 0.5 mg  0.5 mg Oral QHS Carlisle Cater, PA-C   0.5 mg at 07/26/21 2206   senna (SENOKOT) tablet 8.6 mg  1 tablet Oral BID Carlisle Cater, PA-C   8.6  mg at 07/26/21 2206   sertraline (ZOLOFT) tablet 100 mg  100 mg Oral Daily Carlisle Cater, PA-C   100 mg at 07/26/21 0944   simvastatin (ZOCOR) tablet 10 mg  10 mg Oral q1800 Carlisle Cater, PA-C   10 mg at 07/26/21 1714   vitamin B-12 (CYANOCOBALAMIN) tablet 1,000 mcg  1,000 mcg Oral Daily Carlisle Cater, PA-C   1,000 mcg at 07/26/21 0945   Current Outpatient Medications  Medication Sig Dispense Refill   carbidopa-levodopa (SINEMET IR) 25-100 MG tablet Take 1 tablet by mouth 3 (three) times daily. 90 tablet 0   fenofibrate 54 MG tablet Take 1 tablet (54 mg total) by mouth daily. 30 tablet 0   midodrine (PROAMATINE) 5 MG tablet Take 1 tablet (5 mg total) by mouth 3 (three) times daily with meals. (Patient taking differently: Take 5 mg by mouth 2 (two) times daily with a meal.) 90 tablet 0   risperiDONE (RISPERDAL) 0.5 MG tablet Take 1 tablet (0.5 mg total) by mouth at bedtime. 30 tablet 2   sertraline (ZOLOFT) 100 MG tablet Take 1 tablet (100 mg total) by mouth daily. 30 tablet 2   simvastatin (ZOCOR) 10 MG tablet Take 1 tablet (10 mg total) by mouth daily at 6 PM. 30 tablet 0   aspirin 81 MG EC tablet Take 1 tablet (81 mg total) by mouth daily. Swallow whole. (Patient not taking: Reported on 07/21/2021) 30 tablet 0   bisacodyl (DULCOLAX) 10 MG suppository as needed. If not relieved by MOM, give 10 mg Bisacodyl suppositiory rectally X 1 dose in 24 hours as needed (Patient not taking: Reported on 07/21/2021)     magnesium hydroxide (MILK OF MAGNESIA) 400 MG/5ML suspension Take by mouth daily as needed for mild constipation. If no BM in 3 days, give 30 cc Milk of Magnesium p.o. x 1 dose in 24 hours as needed (Do not use standing constipation orders for residents with renal failure CFR less than 30. Contact MD for orders) (Patient not taking: Reported on 07/21/2021)     senna (SENOKOT) 8.6 MG TABS tablet Take 1 tablet (8.6 mg total) by mouth 2 (two) times daily. (Patient not taking: Reported on  07/21/2021) 60 tablet 0   Sodium Phosphates (RA SALINE ENEMA RE) Place rectally. If not relieved by Biscodyl suppository, give disposable Saline Enema rectally X 1 dose/24 hrs as needed (Patient not taking: Reported on 07/21/2021)     vitamin B-12 (CYANOCOBALAMIN) 1000 MCG tablet Take 1 tablet (1,000 mcg total) by mouth daily. (Patient not taking: Reported on 07/21/2021) 30 tablet 0     Discharge Medications: Please see discharge summary for a list of discharge medications.  Relevant Imaging Results:  Relevant Lab Results:   Additional Information SSN [id number]  [place] COVID-19 Vaccine 10/15/2019 , 09/09/2019. Patient has abdominal binder and compression wear lower extremity.  Raina Mina, LCSWA

## 2021-07-27 NOTE — Telephone Encounter (Signed)
Needs to be evaluated at [facility]

## 2021-07-27 NOTE — ED Notes (Signed)
Lunch ordered 

## 2021-07-28 DIAGNOSIS — R9431 Abnormal electrocardiogram [ECG] [EKG]: Secondary | ICD-10-CM | POA: Diagnosis not present

## 2021-07-28 DIAGNOSIS — F329 Major depressive disorder, single episode, unspecified: Secondary | ICD-10-CM | POA: Diagnosis not present

## 2021-07-28 NOTE — Progress Notes (Signed)
CSW called [email or web address] and [place] @ [facility]) Both agreed to review Pt.

## 2021-07-28 NOTE — ED Provider Notes (Signed)
Patient is here pending skilled nursing facility placement.  He has been cleared by our psychiatry team, he was diagnosed with major depressive disorder, and no longer felt that he needed acute psychiatric hospitalization.  Patient's mood has remained stable.  He has not expressed any suicidal ideations to our staff.   Wyvonnia Dusky, MD 07/28/21 [phone]

## 2021-07-28 NOTE — ED Notes (Signed)
Representative of [facility] at bedside for evaluation.

## 2021-07-28 NOTE — Progress Notes (Signed)
CSW left a message with patients case manager in regards to discharge.

## 2021-07-28 NOTE — ED Notes (Signed)
Patient resting quietly in bed, asks for clarification of the date, no further needs expressed.

## 2021-07-28 NOTE — Progress Notes (Signed)
CSW spoke with Fernando Carr in admissions from [facility], and [facility]. There are currently no beds at either facility but admissions will review patient in the hub.

## 2021-07-28 NOTE — ED Notes (Signed)
Lunch Ordered °

## 2021-07-28 NOTE — Progress Notes (Signed)
[facility] stated they would review patient in the hub

## 2021-07-28 NOTE — Progress Notes (Signed)
Patient denied at [facility] and [facility] for SNF.

## 2021-07-29 NOTE — Progress Notes (Signed)
Pt received PASRR # [id number]

## 2021-07-29 NOTE — Progress Notes (Signed)
Physical Therapy Treatment Patient Details Name: Fernando Carr MRN: [id number] DOB: 1940-10-21 Today's Date: 07/29/2021   History of Present Illness 80 y.o. male presents to [facility] ED on 12/21/2022with worsening confusion, refusing medications, poor PO intake, and recent fall. PMH includes bipolar disorder, MDD, HLD, AAA, afib, parkinson's disease.    PT Comments    Pt progressing towards goals. Able to ambulate short distance in hallway with min guard to supervision. to simulate home environment. Still presenting with some confusion and pt denied SNF. Will need max support from [facility] services and [facility] at [place]. Long term would likely benefit from ALF level care. Will continue to follow acutely.    Recommendations for follow up therapy are one component of a multi-disciplinary discharge planning process, led by the attending physician.  Recommendations may be updated based on patient status, additional functional criteria and insurance authorization.  Follow Up Recommendations  Skilled nursing-short term rehab (<3 hours/day) (max HH services as pt denied SNF; max support from [facility])     Assistance Recommended at Discharge Frequent or constant Supervision/Assistance  Equipment Recommendations  None recommended by PT    Recommendations for Other Services       Precautions / Restrictions Precautions Precautions: Fall Precaution Comments: history of syncope, brief period of unresponsiveness on eval     Mobility  Bed Mobility Overal bed mobility: Needs Assistance Bed Mobility: Supine to Sit;Sit to Supine     Supine to sit: Supervision Sit to supine: Supervision   General bed mobility comments: Supervision for safety. Increased time required    Transfers Overall transfer level: Needs assistance Equipment used: Rolling walker (2 wheels) Transfers: Sit to/from Stand Sit to Stand: Supervision           General transfer comment: Supervision for safety     Ambulation/Gait Ambulation/Gait assistance: Min guard;Supervision Gait Distance (Feet): 40 Feet Assistive device: Rollator (4 wheels) Gait Pattern/deviations: Step-through pattern;Trunk flexed Gait velocity: Decreased     General Gait Details: Mild unsteadiness noted throughout. Min guard to supervision for safety. Asymptomatic throughout.   Stairs             Wheelchair Mobility    Modified Rankin (Stroke Patients Only)       Balance Overall balance assessment: Needs assistance Sitting-balance support: No upper extremity supported;Feet supported Sitting balance-Leahy Scale: Fair     Standing balance support: Bilateral upper extremity supported Standing balance-Leahy Scale: Poor Standing balance comment: Reliant on BUE support                            Cognition Arousal/Alertness: Awake/alert Behavior During Therapy: Flat affect Overall Cognitive Status: Impaired/Different from baseline Area of Impairment: Memory;Safety/judgement;Awareness                     Memory: Decreased short-term memory   Safety/Judgement: Decreased awareness of deficits Awareness: Emergent   General Comments: Decreased awareness of safety and deficits.        Exercises      General Comments General comments (skin integrity, edema, etc.): Had discussion about importance of having aide at home as pt not eligible to go to SNF. Pt agreeable      Pertinent Vitals/Pain      Home Living                          Prior Function  PT Goals (current goals can now be found in the care plan section) Acute Rehab PT Goals Patient Stated Goal: to return to prior level of function PT Goal Formulation: With patient Time For Goal Achievement: 08/07/21 Potential to Achieve Goals: Fair Progress towards PT goals: Progressing toward goals    Frequency    Min 2X/week      PT Plan Current plan remains appropriate    Co-evaluation               AM-PAC PT "6 Clicks" Mobility   Outcome Measure  Help needed turning from your back to your side while in a flat bed without using bedrails?: A Little Help needed moving from lying on your back to sitting on the side of a flat bed without using bedrails?: A Little Help needed moving to and from a bed to a chair (including a wheelchair)?: A Little Help needed standing up from a chair using your arms (e.g., wheelchair or bedside chair)?: A Little Help needed to walk in hospital room?: A Little Help needed climbing 3-5 steps with a railing? : A Lot 6 Click Score: 17    End of Session Equipment Utilized During Treatment: Gait belt Activity Tolerance: Patient tolerated treatment well Patient left: in bed;with call bell/phone within reach;with bed alarm set (in bed in ED) Nurse Communication: Mobility status PT Visit Diagnosis: Other abnormalities of gait and mobility (R26.89);Other symptoms and signs involving the nervous system (R29.898)     Time: 1030-1314 PT Time Calculation (min) (ACUTE ONLY): 17 min  Charges:  $Gait Training: 8-22 mins                     Fernando Carr, DPT  Acute Rehabilitation Services  Pager: [phone] Office: [phone]    Fernando Carr 07/29/2021, 3:15 PM

## 2021-07-29 NOTE — Progress Notes (Signed)
CSW spoke to patient about hiring a personal care aide until his case manager can get him into an assisted living facility. PT re-evaluated patient while CSW provided patient with updates. Patient was able to ambulate within the unit using his walker and transfer himself off his bed. Patient agreed to comply with personal care services. CSW spoke with patients case manager who stated she already has his personal care services setup at [facility]. Anderson Malta stated she will arrange transportation for patient to get home. Anderson Malta stated she would call CSW back with the ETA.

## 2021-07-29 NOTE — ED Notes (Signed)
Lunch delivered. 

## 2021-07-29 NOTE — ED Notes (Signed)
PT at bedside with social worker. Patient ambulated with his walker on the unit.

## 2021-07-29 NOTE — Progress Notes (Signed)
Patient has been denied by 31 SNF facilities. CSW updated case manager about patient discharging with max home health and patient agreeing to hire personal care services. Case manager, Anderson Malta stated that patient has the money to pay for personal care services but has fired them in the past to pay his daughters bills.

## 2021-07-30 ENCOUNTER — Encounter (HOSPITAL_COMMUNITY): Payer: Self-pay

## 2021-07-30 DIAGNOSIS — F3340 Major depressive disorder, recurrent, in remission, unspecified: Secondary | ICD-10-CM | POA: Diagnosis not present

## 2021-07-30 DIAGNOSIS — F329 Major depressive disorder, single episode, unspecified: Secondary | ICD-10-CM | POA: Diagnosis not present

## 2021-07-30 NOTE — Discharge Instructions (Addendum)
Social work will continue to coordinate placement to an assisted living facility.

## 2021-07-30 NOTE — ED Notes (Signed)
Pt ambulated to bathroom and back to room w/ rollator and standby assist.

## 2021-07-30 NOTE — ED Provider Notes (Signed)
Emergency Medicine Observation Re-evaluation Note  Fernando Carr is a 80 y.o. male, seen on rounds today.  Pt initially presented to the ED for complaints of Medical Clearance Currently, the patient is awaiting discharge to [facility].  Physical Exam  BP (!) 151/74 (BP Location: Left Arm)    Pulse (!) 58    Temp (!) 97.4 F (36.3 C) (Oral)    Resp 17    Ht 6' (1.829 m)    Wt 90.7 kg    SpO2 97%    BMI 27.12 kg/m  Physical Exam General: NAD, resting tremor noted Cardiac: Well perfused Lungs: even and unlabored Psych: No agitation  ED Course / MDM  EKG:EKG Interpretation  Date/Time:  Wednesday July 21 2021 22:31:52 EST Ventricular Rate:  67 PR Interval:  198 QRS Duration: 88 QT Interval:  386 QTC Calculation: 407 R Axis:   -15 Text Interpretation: Sinus rhythm with Premature atrial complexes Minimal voltage criteria for LVH, may be normal variant ( R in aVL ) Nonspecific ST abnormality Abnormal ECG No significant change since last tracing Confirmed by Deno Etienne [phone]) on 07/21/2021 11:22:15 PM  I have reviewed the labs performed to date as well as medications administered while in observation.  Recent changes in the last 24 hours include: Patient is here pending skilled nursing facility placement.  He has been cleared by our psychiatry team, he was diagnosed with major depressive disorder, and no longer felt that he needed acute psychiatric hospitalization.  Patient's mood has remained stable.  He has not expressed any suicidal ideations to our staff.   Social work note yesterday, 12/29, patient will be set up with an assisted living facility and a personal care aide while waiting case management to help him get into an assisted living facility.  Patient has personal care services set up at [facility].  Stable for discharge home.  Transportation at this time has arrived.    Plan  Current plan is for DC today with outpatient social work placement to ALF.  Fernando Carr is  not under involuntary commitment.     Regan Lemming, MD 07/30/21 1050

## 2021-07-30 NOTE — Progress Notes (Signed)
Patient discharged to [facility]. Patient has agreed to pay for personal in home care services to assist with his ADL's and medication management. Patients case manager, [name] has put in place the personal care services at [facility] to come in daily. Patient also has max home health services. Patients case manager plans on patient transitioning to assisted living.

## 2021-08-03 DIAGNOSIS — R41841 Cognitive communication deficit: Secondary | ICD-10-CM | POA: Diagnosis not present

## 2021-08-03 DIAGNOSIS — R488 Other symbolic dysfunctions: Secondary | ICD-10-CM | POA: Diagnosis not present

## 2021-08-04 DIAGNOSIS — R488 Other symbolic dysfunctions: Secondary | ICD-10-CM | POA: Diagnosis not present

## 2021-08-04 DIAGNOSIS — R41841 Cognitive communication deficit: Secondary | ICD-10-CM | POA: Diagnosis not present

## 2021-08-04 DIAGNOSIS — M6281 Muscle weakness (generalized): Secondary | ICD-10-CM | POA: Diagnosis not present

## 2021-08-04 DIAGNOSIS — G20 Parkinson's disease: Secondary | ICD-10-CM | POA: Diagnosis not present

## 2021-08-04 DIAGNOSIS — R2681 Unsteadiness on feet: Secondary | ICD-10-CM | POA: Diagnosis not present

## 2021-08-04 DIAGNOSIS — R296 Repeated falls: Secondary | ICD-10-CM | POA: Diagnosis not present

## 2021-08-06 DIAGNOSIS — G20 Parkinson's disease: Secondary | ICD-10-CM | POA: Diagnosis not present

## 2021-08-06 DIAGNOSIS — R488 Other symbolic dysfunctions: Secondary | ICD-10-CM | POA: Diagnosis not present

## 2021-08-06 DIAGNOSIS — R296 Repeated falls: Secondary | ICD-10-CM | POA: Diagnosis not present

## 2021-08-06 DIAGNOSIS — R41841 Cognitive communication deficit: Secondary | ICD-10-CM | POA: Diagnosis not present

## 2021-08-06 DIAGNOSIS — R2681 Unsteadiness on feet: Secondary | ICD-10-CM | POA: Diagnosis not present

## 2021-08-06 DIAGNOSIS — M6281 Muscle weakness (generalized): Secondary | ICD-10-CM | POA: Diagnosis not present

## 2021-08-09 DIAGNOSIS — R2681 Unsteadiness on feet: Secondary | ICD-10-CM | POA: Diagnosis not present

## 2021-08-09 DIAGNOSIS — G20 Parkinson's disease: Secondary | ICD-10-CM | POA: Diagnosis not present

## 2021-08-09 DIAGNOSIS — R296 Repeated falls: Secondary | ICD-10-CM | POA: Diagnosis not present

## 2021-08-09 DIAGNOSIS — R488 Other symbolic dysfunctions: Secondary | ICD-10-CM | POA: Diagnosis not present

## 2021-08-09 DIAGNOSIS — M6281 Muscle weakness (generalized): Secondary | ICD-10-CM | POA: Diagnosis not present

## 2021-08-10 DIAGNOSIS — I480 Paroxysmal atrial fibrillation: Secondary | ICD-10-CM | POA: Diagnosis not present

## 2021-08-10 DIAGNOSIS — F3130 Bipolar disorder, current episode depressed, mild or moderate severity, unspecified: Secondary | ICD-10-CM | POA: Diagnosis not present

## 2021-08-10 DIAGNOSIS — F331 Major depressive disorder, recurrent, moderate: Secondary | ICD-10-CM | POA: Diagnosis not present

## 2021-08-10 DIAGNOSIS — R2681 Unsteadiness on feet: Secondary | ICD-10-CM | POA: Diagnosis not present

## 2021-08-10 DIAGNOSIS — M6281 Muscle weakness (generalized): Secondary | ICD-10-CM | POA: Diagnosis not present

## 2021-08-10 DIAGNOSIS — R41841 Cognitive communication deficit: Secondary | ICD-10-CM | POA: Diagnosis not present

## 2021-08-10 DIAGNOSIS — R296 Repeated falls: Secondary | ICD-10-CM | POA: Diagnosis not present

## 2021-08-10 DIAGNOSIS — I951 Orthostatic hypotension: Secondary | ICD-10-CM | POA: Diagnosis not present

## 2021-08-10 DIAGNOSIS — N1831 Chronic kidney disease, stage 3a: Secondary | ICD-10-CM | POA: Diagnosis not present

## 2021-08-10 DIAGNOSIS — R488 Other symbolic dysfunctions: Secondary | ICD-10-CM | POA: Diagnosis not present

## 2021-08-10 DIAGNOSIS — G20 Parkinson's disease: Secondary | ICD-10-CM | POA: Diagnosis not present

## 2021-08-11 ENCOUNTER — Other Ambulatory Visit: Payer: Self-pay

## 2021-08-11 ENCOUNTER — Encounter (HOSPITAL_COMMUNITY): Payer: Self-pay | Admitting: Emergency Medicine

## 2021-08-11 ENCOUNTER — Inpatient Hospital Stay (HOSPITAL_COMMUNITY)
Admission: EM | Admit: 2021-08-11 | Discharge: 2021-08-25 | DRG: 056 | Disposition: A | Discharge status: Skilled Nursing Facility | Payer: Medicare HMO | Source: Skilled Nursing Facility | Attending: Internal Medicine | Admitting: Internal Medicine

## 2021-08-11 ENCOUNTER — Emergency Department (HOSPITAL_COMMUNITY): Payer: Medicare HMO

## 2021-08-11 DIAGNOSIS — F02818 Dementia in other diseases classified elsewhere, unspecified severity, with other behavioral disturbance: Secondary | ICD-10-CM | POA: Diagnosis present

## 2021-08-11 DIAGNOSIS — M6281 Muscle weakness (generalized): Secondary | ICD-10-CM | POA: Diagnosis not present

## 2021-08-11 DIAGNOSIS — R9082 White matter disease, unspecified: Secondary | ICD-10-CM | POA: Diagnosis not present

## 2021-08-11 DIAGNOSIS — F17210 Nicotine dependence, cigarettes, uncomplicated: Secondary | ICD-10-CM | POA: Diagnosis present

## 2021-08-11 DIAGNOSIS — S0990XA Unspecified injury of head, initial encounter: Secondary | ICD-10-CM | POA: Diagnosis not present

## 2021-08-11 DIAGNOSIS — I951 Orthostatic hypotension: Secondary | ICD-10-CM

## 2021-08-11 DIAGNOSIS — Z818 Family history of other mental and behavioral disorders: Secondary | ICD-10-CM | POA: Diagnosis not present

## 2021-08-11 DIAGNOSIS — U071 COVID-19: Secondary | ICD-10-CM | POA: Diagnosis present

## 2021-08-11 DIAGNOSIS — R55 Syncope and collapse: Secondary | ICD-10-CM | POA: Diagnosis present

## 2021-08-11 DIAGNOSIS — R488 Other symbolic dysfunctions: Secondary | ICD-10-CM | POA: Diagnosis not present

## 2021-08-11 DIAGNOSIS — Z79899 Other long term (current) drug therapy: Secondary | ICD-10-CM

## 2021-08-11 DIAGNOSIS — R296 Repeated falls: Secondary | ICD-10-CM | POA: Diagnosis present

## 2021-08-11 DIAGNOSIS — Z9181 History of falling: Secondary | ICD-10-CM

## 2021-08-11 DIAGNOSIS — R410 Disorientation, unspecified: Secondary | ICD-10-CM | POA: Diagnosis not present

## 2021-08-11 DIAGNOSIS — R8281 Pyuria: Secondary | ICD-10-CM | POA: Diagnosis present

## 2021-08-11 DIAGNOSIS — Z811 Family history of alcohol abuse and dependence: Secondary | ICD-10-CM | POA: Diagnosis not present

## 2021-08-11 DIAGNOSIS — F32A Depression, unspecified: Secondary | ICD-10-CM

## 2021-08-11 DIAGNOSIS — E876 Hypokalemia: Secondary | ICD-10-CM | POA: Diagnosis present

## 2021-08-11 DIAGNOSIS — G319 Degenerative disease of nervous system, unspecified: Secondary | ICD-10-CM | POA: Diagnosis not present

## 2021-08-11 DIAGNOSIS — N1832 Chronic kidney disease, stage 3b: Secondary | ICD-10-CM | POA: Diagnosis present

## 2021-08-11 DIAGNOSIS — F3189 Other bipolar disorder: Secondary | ICD-10-CM | POA: Diagnosis not present

## 2021-08-11 DIAGNOSIS — M2578 Osteophyte, vertebrae: Secondary | ICD-10-CM | POA: Diagnosis not present

## 2021-08-11 DIAGNOSIS — Z8679 Personal history of other diseases of the circulatory system: Secondary | ICD-10-CM | POA: Diagnosis not present

## 2021-08-11 DIAGNOSIS — R5381 Other malaise: Secondary | ICD-10-CM | POA: Diagnosis present

## 2021-08-11 DIAGNOSIS — D631 Anemia in chronic kidney disease: Secondary | ICD-10-CM | POA: Diagnosis present

## 2021-08-11 DIAGNOSIS — E7800 Pure hypercholesterolemia, unspecified: Secondary | ICD-10-CM | POA: Diagnosis present

## 2021-08-11 DIAGNOSIS — G20 Parkinson's disease: Secondary | ICD-10-CM | POA: Diagnosis present

## 2021-08-11 DIAGNOSIS — G909 Disorder of the autonomic nervous system, unspecified: Secondary | ICD-10-CM

## 2021-08-11 DIAGNOSIS — F319 Bipolar disorder, unspecified: Secondary | ICD-10-CM | POA: Diagnosis present

## 2021-08-11 DIAGNOSIS — E860 Dehydration: Secondary | ICD-10-CM | POA: Diagnosis present

## 2021-08-11 DIAGNOSIS — E785 Hyperlipidemia, unspecified: Secondary | ICD-10-CM | POA: Diagnosis not present

## 2021-08-11 DIAGNOSIS — R2689 Other abnormalities of gait and mobility: Secondary | ICD-10-CM | POA: Diagnosis not present

## 2021-08-11 DIAGNOSIS — Z7401 Bed confinement status: Secondary | ICD-10-CM | POA: Diagnosis not present

## 2021-08-11 DIAGNOSIS — E861 Hypovolemia: Secondary | ICD-10-CM | POA: Diagnosis present

## 2021-08-11 DIAGNOSIS — G903 Multi-system degeneration of the autonomic nervous system: Secondary | ICD-10-CM | POA: Diagnosis present

## 2021-08-11 DIAGNOSIS — Z751 Person awaiting admission to adequate facility elsewhere: Secondary | ICD-10-CM

## 2021-08-11 DIAGNOSIS — Z96641 Presence of right artificial hip joint: Secondary | ICD-10-CM | POA: Diagnosis present

## 2021-08-11 DIAGNOSIS — I480 Paroxysmal atrial fibrillation: Secondary | ICD-10-CM | POA: Diagnosis present

## 2021-08-11 DIAGNOSIS — R531 Weakness: Secondary | ICD-10-CM | POA: Diagnosis not present

## 2021-08-11 DIAGNOSIS — N179 Acute kidney failure, unspecified: Secondary | ICD-10-CM | POA: Diagnosis present

## 2021-08-11 DIAGNOSIS — I959 Hypotension, unspecified: Secondary | ICD-10-CM | POA: Diagnosis not present

## 2021-08-11 DIAGNOSIS — I9589 Other hypotension: Secondary | ICD-10-CM | POA: Diagnosis not present

## 2021-08-11 DIAGNOSIS — M2550 Pain in unspecified joint: Secondary | ICD-10-CM | POA: Diagnosis not present

## 2021-08-11 DIAGNOSIS — F419 Anxiety disorder, unspecified: Secondary | ICD-10-CM

## 2021-08-11 DIAGNOSIS — R41841 Cognitive communication deficit: Secondary | ICD-10-CM | POA: Diagnosis not present

## 2021-08-11 DIAGNOSIS — S199XXA Unspecified injury of neck, initial encounter: Secondary | ICD-10-CM | POA: Diagnosis not present

## 2021-08-11 DIAGNOSIS — F0283 Dementia in other diseases classified elsewhere, unspecified severity, with mood disturbance: Secondary | ICD-10-CM | POA: Diagnosis present

## 2021-08-11 DIAGNOSIS — W19XXXA Unspecified fall, initial encounter: Secondary | ICD-10-CM | POA: Diagnosis not present

## 2021-08-11 DIAGNOSIS — R0902 Hypoxemia: Secondary | ICD-10-CM | POA: Diagnosis not present

## 2021-08-11 DIAGNOSIS — F0284 Dementia in other diseases classified elsewhere, unspecified severity, with anxiety: Secondary | ICD-10-CM | POA: Diagnosis present

## 2021-08-11 DIAGNOSIS — M4602 Spinal enthesopathy, cervical region: Secondary | ICD-10-CM | POA: Diagnosis not present

## 2021-08-11 DIAGNOSIS — G908 Other disorders of autonomic nervous system: Secondary | ICD-10-CM | POA: Diagnosis not present

## 2021-08-11 LAB — BASIC METABOLIC PANEL
Anion gap: 9 (ref 5–15)
BUN: 52 mg/dL — ABNORMAL HIGH (ref 8–23)
CO2: 22 mmol/L (ref 22–32)
Calcium: 10.1 mg/dL (ref 8.9–10.3)
Chloride: 108 mmol/L (ref 98–111)
Creatinine, Ser: 2.95 mg/dL — ABNORMAL HIGH (ref 0.61–1.24)
GFR, Estimated: 21 mL/min — ABNORMAL LOW (ref >60)
Glucose, Bld: 97 mg/dL (ref 70–99)
Potassium: 4.5 mmol/L (ref 3.5–5.1)
Sodium: 139 mmol/L (ref 135–145)

## 2021-08-11 LAB — URINALYSIS, ROUTINE W REFLEX MICROSCOPIC
Bilirubin Urine: NEGATIVE
Glucose, UA: 50 mg/dL — AB
Hgb urine dipstick: NEGATIVE
Ketones, ur: NEGATIVE mg/dL
Nitrite: POSITIVE — AB
Protein, ur: 30 mg/dL — AB
Specific Gravity, Urine: 1.015 (ref 1.005–1.030)
WBC, UA: >50 WBC/hpf — ABNORMAL HIGH (ref 0–5)
pH: 5 (ref 5.0–8.0)

## 2021-08-11 LAB — CREATININE, SERUM
Creatinine, Ser: 2.66 mg/dL — ABNORMAL HIGH (ref 0.61–1.24)
GFR, Estimated: 24 mL/min — ABNORMAL LOW (ref >60)

## 2021-08-11 LAB — CBC
HCT: 34.2 % — ABNORMAL LOW (ref 39.0–52.0)
HCT: 31.3 % — ABNORMAL LOW (ref 39.0–52.0)
Hemoglobin: 10.5 g/dL — ABNORMAL LOW (ref 13.0–17.0)
Hemoglobin: 9.6 g/dL — ABNORMAL LOW (ref 13.0–17.0)
MCH: 28.2 pg (ref 26.0–34.0)
MCH: 27.5 pg (ref 26.0–34.0)
MCHC: 30.7 g/dL (ref 30.0–36.0)
MCHC: 30.7 g/dL (ref 30.0–36.0)
MCV: 91.7 fL (ref 80.0–100.0)
MCV: 89.7 fL (ref 80.0–100.0)
Platelets: 196 10*3/uL (ref 150–400)
Platelets: 170 10*3/uL (ref 150–400)
RBC: 3.73 MIL/uL — ABNORMAL LOW (ref 4.22–5.81)
RBC: 3.49 MIL/uL — ABNORMAL LOW (ref 4.22–5.81)
RDW: 12.8 % (ref 11.5–15.5)
RDW: 12.9 % (ref 11.5–15.5)
WBC: 7.8 10*3/uL (ref 4.0–10.5)
WBC: 5.9 10*3/uL (ref 4.0–10.5)
nRBC: 0 % (ref 0.0–0.2)
nRBC: 0 % (ref 0.0–0.2)

## 2021-08-11 LAB — RESP PANEL BY RT-PCR (FLU A&B, COVID) ARPGX2
Influenza A by PCR: NEGATIVE
Influenza B by PCR: NEGATIVE
SARS Coronavirus 2 by RT PCR: POSITIVE — AB

## 2021-08-11 LAB — CBG MONITORING, ED: Glucose-Capillary: 116 mg/dL — ABNORMAL HIGH (ref 70–99)

## 2021-08-11 MED ORDER — ENOXAPARIN SODIUM 30 MG/0.3ML IJ SOSY
30.0000 mg | PREFILLED_SYRINGE | INTRAMUSCULAR | Status: DC
  Administered 2021-08-12 – 2021-08-16 (×5): 30 mg via SUBCUTANEOUS
  Filled 2021-08-11 (×5): qty 0.3

## 2021-08-11 MED ORDER — ACETAMINOPHEN 325 MG PO TABS: 650.0000 mg | ORAL_TABLET | Freq: Four times a day (QID) | ORAL | Status: DC | PRN

## 2021-08-11 MED ORDER — MELATONIN 3 MG PO TABS
3.0000 mg | ORAL_TABLET | Freq: Every evening | ORAL | Status: DC | PRN
  Administered 2021-08-12 – 2021-08-24 (×7): 3 mg via ORAL
  Filled 2021-08-11 (×7): qty 1

## 2021-08-11 MED ORDER — FENOFIBRATE 54 MG PO TABS
54.0000 mg | ORAL_TABLET | Freq: Every day | ORAL | Status: DC
  Administered 2021-08-12 – 2021-08-25 (×14): 54 mg via ORAL
  Filled 2021-08-11 (×14): qty 1

## 2021-08-11 MED ORDER — SODIUM CHLORIDE 0.9 % IV BOLUS
500.0000 mL | Freq: Once | INTRAVENOUS | Status: AC
  Administered 2021-08-11: 500 mL via INTRAVENOUS

## 2021-08-11 MED ORDER — SERTRALINE HCL 100 MG PO TABS
100.0000 mg | ORAL_TABLET | Freq: Every day | ORAL | Status: DC
  Administered 2021-08-12 – 2021-08-25 (×14): 100 mg via ORAL
  Filled 2021-08-11: qty 2
  Filled 2021-08-11 (×13): qty 1

## 2021-08-11 MED ORDER — SODIUM CHLORIDE 0.9% FLUSH
3.0000 mL | Freq: Two times a day (BID) | INTRAVENOUS | Status: DC
  Administered 2021-08-11 – 2021-08-25 (×25): 3 mL via INTRAVENOUS

## 2021-08-11 MED ORDER — CARBIDOPA-LEVODOPA 25-100 MG PO TABS
1.0000 | ORAL_TABLET | Freq: Three times a day (TID) | ORAL | Status: DC
  Administered 2021-08-11 – 2021-08-25 (×42): 1 via ORAL
  Filled 2021-08-11 (×48): qty 1

## 2021-08-11 MED ORDER — LACTATED RINGERS IV SOLN: INTRAVENOUS | Status: DC

## 2021-08-11 MED ORDER — SIMVASTATIN 10 MG PO TABS
10.0000 mg | ORAL_TABLET | Freq: Every day | ORAL | Status: DC
  Administered 2021-08-12 – 2021-08-25 (×14): 10 mg via ORAL
  Filled 2021-08-11 (×15): qty 1

## 2021-08-11 MED ORDER — MIDODRINE HCL 5 MG PO TABS
5.0000 mg | ORAL_TABLET | Freq: Three times a day (TID) | ORAL | Status: DC
  Administered 2021-08-12 – 2021-08-13 (×3): 5 mg via ORAL
  Filled 2021-08-11 (×6): qty 1

## 2021-08-11 NOTE — ED Triage Notes (Signed)
Pt BIBA Per EMS: pt coming from [facility] w/ c/o 3 syncopal episodes today. Reports nausea. Did not fall. After last episode pt has become altered. Just d/c in December for syncopal episodes. A&O x 3 w/ confusion  120/95 upon arrival  69/41BP now  68 HR  20 L forearm  103 CBG  97.5 temp

## 2021-08-11 NOTE — ED Provider Notes (Signed)
[facility] DEPT Provider Note   CSN: [id number] Arrival date & time: 08/11/21  1344     History  Chief Complaint  Patient presents with   Syncope    Syncope     Fernando Carr Threat is a 81 y.o. male.  HPI Patient presents by EMS for evaluation of "syncope."  EMS reports that he had 3 syncopal episodes.  He states he was sitting in a Rollator when this happened.  He is unable to give additional history.  Level 5 caveat-confusion  Home Medications Prior to Admission medications   Medication Sig Start Date End Date Taking? Authorizing Provider  carbidopa-levodopa (SINEMET IR) 25-100 MG tablet Take 1 tablet by mouth 3 (three) times daily. 04/23/21  Yes Medina-Vargas, Monina C, NP  fenofibrate 54 MG tablet Take 1 tablet (54 mg total) by mouth daily. 04/23/21  Yes Medina-Vargas, Monina C, NP  midodrine (PROAMATINE) 5 MG tablet Take 1 tablet (5 mg total) by mouth 3 (three) times daily with meals. Patient taking differently: Take 10 mg by mouth 2 (two) times daily with a meal. 04/23/21  Yes Medina-Vargas, Monina C, NP  risperiDONE (RISPERDAL) 0.5 MG tablet Take 1 tablet (0.5 mg total) by mouth at bedtime. 06/08/21  Yes [name], [name], MD  sertraline (ZOLOFT) 100 MG tablet Take 1 tablet (100 mg total) by mouth daily. Patient taking differently: Take 150 mg by mouth daily. 06/08/21  Yes [name], [name], MD  simvastatin (ZOCOR) 10 MG tablet Take 1 tablet (10 mg total) by mouth daily at 6 PM. 04/23/21  Yes Medina-Vargas, Monina C, NP  aspirin 81 MG EC tablet Take 1 tablet (81 mg total) by mouth daily. Swallow whole. Patient not taking: Reported on 07/21/2021 04/23/21   Medina-Vargas, Monina C, NP  senna (SENOKOT) 8.6 MG TABS tablet Take 1 tablet (8.6 mg total) by mouth 2 (two) times daily. Patient not taking: Reported on 07/21/2021 04/23/21   Medina-Vargas, Monina C, NP  vitamin B-12 (CYANOCOBALAMIN) 1000 MCG tablet Take 1 tablet (1,000 mcg total) by mouth daily. Patient not  taking: Reported on 07/21/2021 04/23/21   Medina-Vargas, Senaida Lange, NP      Allergies    Patient has no known allergies.    Review of Systems   Review of Systems  Unable to perform ROS: Mental status change   Physical Exam Updated Vital Signs BP (!) 152/83    Pulse 68    Temp 97.8 F (36.6 C) (Oral)    Resp 17    SpO2 98%  Physical Exam Vitals and nursing note reviewed.  Constitutional:      General: He is not in acute distress.    Appearance: He is well-developed. He is not ill-appearing or diaphoretic.  HENT:     Head: Normocephalic.     Comments: Linear contusion  left cheek  2 cm below the left eye, mild associated swelling, no associated crepitation or deformity.    Right Ear: External ear normal.     Left Ear: External ear normal.     Nose: No congestion or rhinorrhea.  Eyes:     Conjunctiva/sclera: Conjunctivae normal.     Pupils: Pupils are equal, round, and reactive to light.  Neck:     Trachea: Phonation normal.  Cardiovascular:     Rate and Rhythm: Normal rate and regular rhythm.     Heart sounds: Normal heart sounds. No murmur heard. Pulmonary:     Effort: Pulmonary effort is normal.     Breath  sounds: Normal breath sounds.  Abdominal:     General: There is no distension.     Palpations: Abdomen is soft.     Tenderness: There is no abdominal tenderness.  Musculoskeletal:        General: No swelling or tenderness. Normal range of motion.     Cervical back: Normal range of motion and neck supple.     Comments: Normal range of motion, arms and legs bilaterally  Skin:    General: Skin is warm and dry.  Neurological:     Mental Status: He is alert.     Cranial Nerves: No cranial nerve deficit.     Sensory: No sensory deficit.     Motor: No abnormal muscle tone.     Coordination: Coordination normal.  Psychiatric:        Mood and Affect: Mood normal.        Behavior: Behavior normal.    ED Results / Procedures / Treatments   Labs (all labs ordered are  listed, but only abnormal results are displayed) Labs Reviewed  BASIC METABOLIC PANEL - Abnormal; Notable for the following components:      Result Value   BUN 52 (*)    Creatinine, Ser 2.95 (*)    GFR, Estimated 21 (*)    All other components within normal limits  CBC - Abnormal; Notable for the following components:   RBC 3.73 (*)    Hemoglobin 10.5 (*)    HCT 34.2 (*)    All other components within normal limits  CBG MONITORING, ED - Abnormal; Notable for the following components:   Glucose-Capillary 116 (*)    All other components within normal limits  URINALYSIS, ROUTINE W REFLEX MICROSCOPIC  CBG MONITORING, ED    EKG EKG Interpretation  Date/Time:  Wednesday August 11 2021 15:06:02 EST Ventricular Rate:  73 PR Interval:  56 QRS Duration: 104 QT Interval:  379 QTC Calculation: 418 R Axis:   -41 Text Interpretation: Atrial flutter Supraventricular bigeminy Short PR interval Left axis deviation Minimal ST depression, inferior leads Since last tracing of earlier today No significant change was found Confirmed by Daleen Bo [phone]) on 08/11/2021 3:17:45 PM  Radiology CT Head Wo Contrast  Result Date: 08/11/2021 CLINICAL DATA:  Blunt trauma., poly trauma EXAM: CT HEAD WITHOUT CONTRAST CT CERVICAL SPINE WITHOUT CONTRAST TECHNIQUE: Multidetector CT imaging of the head and cervical spine was performed following the standard protocol without intravenous contrast. Multiplanar CT image reconstructions of the cervical spine were also generated. RADIATION DOSE REDUCTION: This exam was performed according to the departmental dose-optimization program which includes automated exposure control, adjustment of the mA and/or kV according to patient size and/or use of iterative reconstruction technique. COMPARISON:  03/24/2021 FINDINGS: CT HEAD FINDINGS Brain: No acute intracranial hemorrhage. No focal mass lesion. No CT evidence of acute infarction. No midline shift or mass effect. No  hydrocephalus. Basilar cisterns are patent. There are periventricular and subcortical white matter hypodensities. Generalized cortical atrophy. Vascular: No hyperdense vessel or unexpected calcification. Skull: Normal. Negative for fracture or focal lesion. Sinuses/Orbits: Paranasal sinuses and mastoid air cells are clear. Orbits are clear. Other: None. CT CERVICAL SPINE FINDINGS Alignment: Normal alignment of the cervical vertebral bodies. Skull base and vertebrae: Normal craniocervical junction. No loss of vertebral body height or disc height. Normal facet articulation. No evidence of fracture. Soft tissues and spinal canal: No prevertebral soft tissue swelling. No perispinal or epidural hematoma. Disc levels: Endplate sclerosis due to space narrowing at C3-C4  C4-C5. Endplate spurring from H4-T6. No acute subluxation. No acute loss vertebral body height disc height. No evidence of fracture Calcified pannus posterior to the dens. Upper chest: Clear Other: Incidental finding of calcification of the nuchal ligament IMPRESSION: 1. No intracranial trauma. 2. Chronic atrophy and white matter microvascular disease. 3. No cervical spine fracture. 4. Multilevel disc osteophytic disease. Electronically Signed   By: Suzy Bouchard M.D.   On: 08/11/2021 16:22   CT Cervical Spine Wo Contrast  Result Date: 08/11/2021 CLINICAL DATA:  Blunt trauma., poly trauma EXAM: CT HEAD WITHOUT CONTRAST CT CERVICAL SPINE WITHOUT CONTRAST TECHNIQUE: Multidetector CT imaging of the head and cervical spine was performed following the standard protocol without intravenous contrast. Multiplanar CT image reconstructions of the cervical spine were also generated. RADIATION DOSE REDUCTION: This exam was performed according to the departmental dose-optimization program which includes automated exposure control, adjustment of the mA and/or kV according to patient size and/or use of iterative reconstruction technique. COMPARISON:  03/24/2021  FINDINGS: CT HEAD FINDINGS Brain: No acute intracranial hemorrhage. No focal mass lesion. No CT evidence of acute infarction. No midline shift or mass effect. No hydrocephalus. Basilar cisterns are patent. There are periventricular and subcortical white matter hypodensities. Generalized cortical atrophy. Vascular: No hyperdense vessel or unexpected calcification. Skull: Normal. Negative for fracture or focal lesion. Sinuses/Orbits: Paranasal sinuses and mastoid air cells are clear. Orbits are clear. Other: None. CT CERVICAL SPINE FINDINGS Alignment: Normal alignment of the cervical vertebral bodies. Skull base and vertebrae: Normal craniocervical junction. No loss of vertebral body height or disc height. Normal facet articulation. No evidence of fracture. Soft tissues and spinal canal: No prevertebral soft tissue swelling. No perispinal or epidural hematoma. Disc levels: Endplate sclerosis due to space narrowing at C3-C4 C4-C5. Endplate spurring from L4-Y5. No acute subluxation. No acute loss vertebral body height disc height. No evidence of fracture Calcified pannus posterior to the dens. Upper chest: Clear Other: Incidental finding of calcification of the nuchal ligament IMPRESSION: 1. No intracranial trauma. 2. Chronic atrophy and white matter microvascular disease. 3. No cervical spine fracture. 4. Multilevel disc osteophytic disease. Electronically Signed   By: Suzy Bouchard M.D.   On: 08/11/2021 16:22    Procedures Procedures    Medications Ordered in ED Medications  sodium chloride 0.9 % bolus 500 mL (has no administration in time range)    ED Course/ Medical Decision Making/ A&P                           Medical Decision Making   Patient Vitals for the past 24 hrs:  BP Temp Temp src Pulse Resp SpO2  08/11/21 1715 (!) 152/83 -- -- 68 17 98 %  08/11/21 1615 101/61 -- -- 64 19 96 %  08/11/21 1515 132/63 -- -- 64 16 98 %  08/11/21 1500 -- -- -- 65 15 94 %  08/11/21 1455 -- -- -- 65 19  96 %  08/11/21 1450 -- -- -- 64 20 97 %  08/11/21 1445 -- -- -- 64 16 96 %  08/11/21 1440 -- -- -- 69 19 98 %  08/11/21 1435 -- -- -- 69 20 97 %  08/11/21 1430 -- -- -- 72 14 98 %  08/11/21 1425 -- -- -- 62 17 98 %  08/11/21 1420 -- -- -- 64 19 97 %  08/11/21 1415 -- -- -- 66 20 96 %  08/11/21 1410 -- -- -- 72 18 98 %  08/11/21 1407 -- 97.8 F (36.6 C) Oral -- -- --  08/11/21 1405 -- -- -- 72 18 99 %  08/11/21 1400 -- -- -- 68 20 99 %  08/11/21 1355 (!) 117/58 -- -- 63 -- 97 %    5:13 PM Reevaluation with update and discussion with patient. After initial assessment and treatment, an updated evaluation reveals he is comfortable and alert. Illness risk, recurrent psych, discussed. Daleen Bo    Medical Decision Making: Summary of Illness/Injury: Patient presented for evaluation of apparent recurrent syncope.  He has a history of orthostasis and is on midodrine.  Critical Interventions-clinical evaluation laboratory testing, radiography, observation and reassessment; to evaluate  Chief Complaint  Patient presents with   Syncope    Syncope     and assess for illness characterized as Chronic, Self-Limited, Uncertain Prognosis, Complicated, Systemic Symptoms, and Threat to Life/Bodily Function   The Differential Diagnoses include chronic syncope due to low blood pressure when standing, acute illness, cardiovascular instability.  I obtained  Additional Historical Information from [place] sister by telephone , as the patient is a poor historian.  I decided to review pertinent External Data, and in summary presenting with syncope occurring multiple times.  He has a history of same, and also history of renal insufficiency, requiring hospitalization several months ago..   Clinical Laboratory Tests Ordered, included CBC and Metabolic panel. Review indicates normal except BUN high, creatinine high, GFR low, hemoglobin low. Emergent testing abnormality management required for  stabilization-IV fluids for renal insufficiency/AKI  Radiologic Tests Ordered, included CT head and cervical spine to evaluate for traumatic injuries.  I independently Visualized: Radiographic images, which show no traumatic injuries  Cardiac Monitor Tracing which shows atrial flutter versus PAC, indicating potentially unstable cardiac rhythm    This patient is Presenting for Evaluation of syncope which is apparently recurrent, which does require a range of treatment options, and is a complaint that involves a moderate risk of morbidity and mortality.  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  Review of laboratory data indicate that he has been having gradually increasing creatinine over the last month.  This is likely nutritional.  There is been no reported vomiting.  He is a poor historian.  Unclear if there is cardiac etiology as well with irregular heartbeat, however EKG interpretation is limited by artifact.  He has seen cardiology in the past.  Is not clear if he has a prior diagnosis of paroxysmal atrial fibrillation    5:19 PM-Consult complete with hospitalist. Patient case explained and discussed. Requirement for hospitalization agreed on. Possible Risk of decompensation, syncope and cardiovascular complaint.  She agrees to admit patient for further evaluation and treatment. Call ended at 5:37 PM  CRITICAL CARE-no Performed by: Daleen Bo              Final Clinical Impression(s) / ED Diagnoses Final diagnoses:  None    Rx / DC Orders ED Discharge Orders     None         Daleen Bo, MD 08/11/21 1739
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
## 2021-08-11 NOTE — ED Notes (Signed)
Pt  unable to urinate at this time. Will collect urine when pt. Voids. Nurses aware.

## 2021-08-12 ENCOUNTER — Ambulatory Visit (HOSPITAL_COMMUNITY): Payer: Medicare HMO | Admitting: Psychiatry

## 2021-08-12 LAB — RENAL FUNCTION PANEL
Albumin: 3.6 g/dL (ref 3.5–5.0)
Anion gap: 8 (ref 5–15)
BUN: 50 mg/dL — ABNORMAL HIGH (ref 8–23)
CO2: 25 mmol/L (ref 22–32)
Calcium: 10.1 mg/dL (ref 8.9–10.3)
Chloride: 107 mmol/L (ref 98–111)
Creatinine, Ser: 2.11 mg/dL — ABNORMAL HIGH (ref 0.61–1.24)
GFR, Estimated: 31 mL/min — ABNORMAL LOW (ref >60)
Glucose, Bld: 96 mg/dL (ref 70–99)
Phosphorus: 3.3 mg/dL (ref 2.5–4.6)
Potassium: 4.4 mmol/L (ref 3.5–5.1)
Sodium: 140 mmol/L (ref 135–145)

## 2021-08-12 LAB — CBC
HCT: 33.8 % — ABNORMAL LOW (ref 39.0–52.0)
Hemoglobin: 10.2 g/dL — ABNORMAL LOW (ref 13.0–17.0)
MCH: 27.4 pg (ref 26.0–34.0)
MCHC: 30.2 g/dL (ref 30.0–36.0)
MCV: 90.9 fL (ref 80.0–100.0)
Platelets: 166 10*3/uL (ref 150–400)
RBC: 3.72 MIL/uL — ABNORMAL LOW (ref 4.22–5.81)
RDW: 13 % (ref 11.5–15.5)
WBC: 4.7 10*3/uL (ref 4.0–10.5)
nRBC: 0 % (ref 0.0–0.2)

## 2021-08-12 LAB — MAGNESIUM: Magnesium: 2.2 mg/dL (ref 1.7–2.4)

## 2021-08-12 LAB — CBG MONITORING, ED: Glucose-Capillary: 87 mg/dL (ref 70–99)

## 2021-08-12 MED ORDER — SODIUM CHLORIDE 0.9 % IV SOLN
1.0000 g | INTRAVENOUS | Status: AC
  Administered 2021-08-12 – 2021-08-14 (×3): 1 g via INTRAVENOUS
  Filled 2021-08-12 (×3): qty 10

## 2021-08-12 NOTE — Progress Notes (Signed)
Occupational Therapy Evaluation  Patient reports he is currently in an independent living facility and reports since last hospitalization social worker was trying to assist him into getting in an assisted living facility "but I don't know what happened with that." Patient reports a few falls since hospitalization in December, primarily related to him feeling weak and passing out. When feeling well patient states he can dress and bath himself however has been receiving more help with this from caregivers "they babysit me." Patient uses rollator for ambulation and will sometimes sit on seat and propel himself with his legs. Currently patient + for orthostatic hypotension with systolic reading 77 upon standing therefore did not attempt further mobility at this time. Acute OT to follow.     08/12/21 1426  OT Visit Information  Last OT Received On 08/12/21  Assistance Needed +1  History of Present Illness 81 y.o. male presents to [facility] ED on 12/21/2022with worsening confusion, refusing medications, poor PO intake, and recent fall. PMH includes bipolar disorder, MDD, HLD, AAA, afib, parkinson's disease.  Precautions  Precautions Fall  Precaution Comments history of syncope  Restrictions  Weight Bearing Restrictions No  Home Living  Family/patient expects to be discharged to: Other (Comment) ([facility])  Living Arrangements Alone  Available Help at Discharge  (pt is unable to identify any caregiver support)  Type of Home Independent living facility (needs to switch to assisted living unit but says it costs too much)  Home Access Level entry  Home Layout One level  Engineer, manufacturing systems Yes  Home Equipment Rollator (4 wheels);BSC/3in1;Shower seat;Grab bars - tub/shower;Grab bars - toilet;Wheelchair - manual  Additional Comments pt states supposed to be moving to [place] home so he can have more assistance. 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  Communication  Communication No difficulties  Pain Assessment  Pain Assessment No/denies pain  Cognition  Arousal/Alertness Awake/alert  Behavior During Therapy Flat affect  Overall Cognitive Status Impaired/Different from baseline  Area of Impairment Memory;Safety/judgement;Awareness  Memory Decreased short-term memory  Safety/Judgement Decreased awareness of deficits  Awareness Emergent  General Comments pt with poor use of brakes during transfers, reports confusion last night  Upper Extremity Assessment  Upper Extremity Assessment RUE deficits/detail;LUE deficits/detail  RUE Deficits / Details BUE resting tremor, grossly 4/5 strength  LUE Deficits / Details BUE resting tremor, grossly 4/5 strength  Lower Extremity Assessment  Lower Extremity Assessment Defer to PT evaluation  Cervical / Trunk Assessment  Cervical / Trunk Assessment Kyphotic  ADL  Overall ADL's  Needs assistance/impaired  Eating/Feeding Set up;Bed level  Grooming Set up;Sitting  Upper Body Bathing Set up;Sitting  Lower Body Bathing Moderate assistance;Sitting/lateral leans;Sit to/from stand  Upper Body Dressing  Set up;Sitting  Lower Body Dressing Moderate assistance;Sitting/lateral leans;Sit to/from stand  Lower Body Dressing Details (indicate cue type and reason) Limited standing tolerance due to orthostatic hypotension  Toilet Transfer Minimal assistance  Toilet Transfer Details (indicate cue type and reason) Patient stood from edge of bed, min A for safety. + orthstatic hypotension therefore did not progress beyond edge of bed  Toileting- Clothing Manipulation and Hygiene Moderate assistance;Sitting/lateral lean;Sit to/from stand  Functional mobility during ADLs Minimal  assistance  Bed Mobility  Overal bed  mobility Needs Assistance  Bed Mobility Supine to Sit;Sit to Supine  Supine to sit Supervision  Sit to supine Supervision  Transfers  Overall transfer level Needs assistance  Equipment used None  Transfers Sit to/from Stand  Sit to Stand Min assist  General transfer comment min A for safety  Balance  Overall balance assessment Needs assistance  Sitting-balance support No upper extremity supported;Feet supported  Sitting balance-Leahy Scale Fair  Standing balance support No upper extremity supported  Standing balance-Leahy Scale Fair  General Comments  General comments (skin integrity, edema, etc.) Patient positive for orthostatic hypotension in standing systolic reading 77  OT - End of Session  Activity Tolerance Treatment limited secondary to medical complications (Comment) (orthostatic hypotension)  Patient left in bed;with call bell/phone within reach  Nurse Communication Mobility status  OT Assessment  OT Recommendation/Assessment Patient needs continued OT Services  OT Visit Diagnosis History of falling (Z91.81)  OT Problem List Decreased activity tolerance;Impaired balance (sitting and/or standing)  OT Plan  OT Frequency (ACUTE ONLY) Min 2X/week  OT Treatment/Interventions (ACUTE ONLY) Self-care/ADL training;Therapeutic activities;Patient/family education;Balance training;DME and/or AE instruction;Energy conservation;Therapeutic exercise  AM-PAC OT "6 Clicks" Daily Activity Outcome Measure (Version 2)  Help from another person eating meals? 3  Help from another person taking care of personal grooming? 3  Help from another person toileting, which includes using toliet, bedpan, or urinal? 2  Help from another person bathing (including washing, rinsing, drying)? 2  Help from another person to put on and taking off regular upper body clothing? 3  Help from another person to put on and taking off regular lower body clothing? 2  6 Click  Score 15  Progressive Mobility  What is the highest level of mobility based on the progressive mobility assessment? Level 3 (Stands with assist) - Balance while standing  and cannot march in place  Mobility Sit up in bed/chair position for meals  OT Recommendation  Follow Up Recommendations Skilled nursing-short term rehab (<3 hours/day)  Assistance recommended at discharge Frequent or constant Supervision/Assistance  Patient can return home with the following A little help with walking and/or transfers;A lot of help with bathing/dressing/bathroom  Functional Status Assessent Patient has had a recent decline in their functional status and demonstrates the ability to make significant improvements in function in a reasonable and predictable amount of time.  OT Equipment None recommended by OT  Individuals Consulted  Consulted and Agree with Results and Recommendations Patient  Acute Rehab OT Goals  Patient Stated Goal Transition to ALF  OT Goal Formulation With patient  Time For Goal Achievement 08/26/21  Potential to Achieve Goals Good  OT Time Calculation  OT Start Time (ACUTE ONLY) 1248  OT Stop Time (ACUTE ONLY) 1310  OT Time Calculation (min) 22 min  OT General Charges  $OT Visit 1 Visit  OT Evaluation  $OT Eval Low Complexity 1 Low  Written Expression  Dominant Hand Right   Delbert Phenix OT OT pager: [phone]

## 2021-08-12 NOTE — Progress Notes (Signed)
PROGRESS NOTE  Erik Burkett Nater QIO:[id number] DOB: 02/16/1941 DOA: 08/11/2021 PCP: [name], MD  HPI/Recap of past 24 hours: Fernando Carr is a 81 y.o. male with medical history significant for Parkinson's disease, paroxysmal A. fib not on anticoagulation due to frequent falls, recurrent syncope, bipolar disorder type 1, CKD 3B who presented to [facility] ED after briefly losing consciousness.  Witnessed by nursing staff.  EMS was activated and also witnessed 3 syncopal episodes.  No postictal symptoms.  Patient endorses that he felt weak prior to passing out.  No chest pain or palpitations.  States he did not have much to drink or eat today.  He was in his usual state of health prior to this.  At the time of this interview, he is alert and oriented x 3.  In the ED work up revealed hypovolemia and AKI.  Received 500 cc IV fluid bolus normal saline.  EDP requested admission for recurrent syncope.  Admitted by hospitalist service.  UA positive for pyuria, started on Rocephin.   08/12/2021: Patient was seen and examined at his bedside in the ED.  Significant positive orthostatic vital signs present despite IV fluid hydration.     Assessment/Plan: Principal Problem:   Syncope  Recurrent syncope, suspect secondary to hypovolemia and significant orthostatic hypotension Hypovolemic on exam, dehydrated Prodrome, weakness prior to losing consciousness Positive orthostatic vital signs. PT OT assessment Continue home midodrine, abdominal binders, TED hose. Continue gentle IV fluid hydration and fall precautions   Improving AKI on CKD 3B, prerenal secondary to dehydration Baseline creatinine appears to be 2.0 with GFR of 31 Presented with creatinine of 2.95 with GFR of 21. Creatinine is downtrending 2.11 from 2.66 Avoid nephrotoxic agents, dehydration and hypotension. Closely monitor urine output with strict I's and O's Repeat renal panel in the morning.   Parkinson's disease Continue home regimen, on  Sinemet Fall precaution   Autonomic dysfunction in the setting of Parkinson's disease with recurrent syncope Continue home regimen, on midodrine   Hyperlipidemia Resume home regimen on fenofibrate and simvastatin.   Chronic anxiety/depression/bipolar disorder Resume home regimen   Physical debility PT OT recommending SNF Continue fall precautions   Paroxysmal A. fib, not on oral anticoagulation due to frequent falls Monitor on telemetry   Anemia of chronic disease in the setting of CKD Hemoglobin is at baseline No overt bleeding      DVT prophylaxis: Subcu Lovenox daily   Code Status: Full code   Family Communication: None at bedside   Disposition Plan: Likely will return to [employer] called: None   Admission status: Observation status.     Status is: Observation         Objective: Vitals:   08/12/21 1513 08/12/21 1515 08/12/21 1622 08/12/21 1626  BP:  (!) 155/70  (!) 188/73  Pulse: 65 64  66  Resp:    (!) 24  Temp:    98.5 F (36.9 C)  TempSrc:    Oral  SpO2: 98% 99% 99% 100%    Intake/Output Summary (Last 24 hours) at 08/12/2021 1755 Last data filed at 08/11/2021 2159 Gross per 24 hour  Intake 519.67 ml  Output --  Net 519.67 ml   There were no vitals filed for this visit.  Exam:  General: 81 y.o. year-old male well developed well nourished in no acute distress.  Alert and oriented x3. Cardiovascular: Regular rate and rhythm with no rubs or gallops.  No thyromegaly or JVD noted.   Respiratory:  Clear to auscultation with no wheezes or rales. Good inspiratory effort. Abdomen: Soft nontender nondistended with normal bowel sounds x4 quadrants. Musculoskeletal: No lower extremity edema. 2/4 pulses in all 4 extremities. Skin: No ulcerative lesions noted or rashes, Psychiatry: Mood is appropriate for condition and setting   Data Reviewed: CBC: Recent Labs  Lab 08/11/21 1446 08/11/21 1800 08/12/21 0525  WBC 7.8 5.9 4.7  HGB  10.5* 9.6* 10.2*  HCT 34.2* 31.3* 33.8*  MCV 91.7 89.7 90.9  PLT 196 170 308   Basic Metabolic Panel: Recent Labs  Lab 08/11/21 1446 08/11/21 1800 08/12/21 0525  NA 139  --  140  K 4.5  --  4.4  CL 108  --  107  CO2 22  --  25  GLUCOSE 97  --  96  BUN 52*  --  50*  CREATININE 2.95* 2.66* 2.11*  CALCIUM 10.1  --  10.1  MG  --   --  2.2  PHOS  --   --  3.3   GFR: CrCl cannot be calculated (Unknown ideal weight.). Liver Function Tests: Recent Labs  Lab 08/12/21 0525  ALBUMIN 3.6   No results for input(s): LIPASE, AMYLASE in the last 168 hours. No results for input(s): AMMONIA in the last 168 hours. Coagulation Profile: No results for input(s): INR, PROTIME in the last 168 hours. Cardiac Enzymes: No results for input(s): CKTOTAL, CKMB, CKMBINDEX, TROPONINI in the last 168 hours. BNP (last 3 results) No results for input(s): PROBNP in the last 8760 hours. HbA1C: No results for input(s): HGBA1C in the last 72 hours. CBG: Recent Labs  Lab 08/11/21 1403 08/12/21 0644  GLUCAP 116* 87   Lipid Profile: No results for input(s): CHOL, HDL, LDLCALC, TRIG, CHOLHDL, LDLDIRECT in the last 72 hours. Thyroid Function Tests: No results for input(s): TSH, T4TOTAL, FREET4, T3FREE, THYROIDAB in the last 72 hours. Anemia Panel: No results for input(s): VITAMINB12, FOLATE, FERRITIN, TIBC, IRON, RETICCTPCT in the last 72 hours. Urine analysis:    Component Value Date/Time   COLORURINE YELLOW 08/11/2021 2130   APPEARANCEUR CLOUDY (A) 08/11/2021 2130   LABSPEC 1.015 08/11/2021 2130   PHURINE 5.0 08/11/2021 2130   GLUCOSEU 50 (A) 08/11/2021 2130   HGBUR NEGATIVE 08/11/2021 2130   BILIRUBINUR NEGATIVE 08/11/2021 2130   [place] NEGATIVE 08/11/2021 2130   PROTEINUR 30 (A) 08/11/2021 2130   UROBILINOGEN 1.0 08/19/2014 1745   NITRITE POSITIVE (A) 08/11/2021 2130   LEUKOCYTESUR LARGE (A) 08/11/2021 2130   Sepsis Labs: @LABRCNTIP (procalcitonin:4,lacticidven:4)  ) Recent Results  (from the past 240 hour(s))  Resp Panel by RT-PCR (Flu A&B, Covid) Nasopharyngeal Swab     Status: Abnormal   Collection Time: 08/11/21  5:24 PM   Specimen: Nasopharyngeal Swab; Nasopharyngeal(NP) swabs in vial transport medium  Result Value Ref Range Status   SARS Coronavirus 2 by RT PCR POSITIVE (A) NEGATIVE Final    Comment: (NOTE) SARS-CoV-2 target nucleic acids are DETECTED.  The SARS-CoV-2 RNA is generally detectable in upper respiratory specimens during the acute phase of infection. Positive results are indicative of the presence of the identified virus, but do not rule out bacterial infection or co-infection with other pathogens not detected by the test. Clinical correlation with patient history and other diagnostic information is necessary to determine patient infection status. The expected result is Negative.  Fact Sheet for Patients: EntrepreneurPulse.com.au  Fact Sheet for Healthcare Providers: IncredibleEmployment.be  This test is not yet approved or cleared by the [place] FDA and  has been  authorized for detection and/or diagnosis of SARS-CoV-2 by FDA under an Emergency Use Authorization (EUA).  This EUA will remain in effect (meaning this test can be used) for the duration of  the COVID-19 declaration under Section 564(b)(1) of the A ct, 21 U.S.C. section 360bbb-3(b)(1), unless the authorization is terminated or revoked sooner.     Influenza A by PCR NEGATIVE NEGATIVE Final   Influenza B by PCR NEGATIVE NEGATIVE Final    Comment: (NOTE) The Xpert Xpress SARS-CoV-2/FLU/RSV plus assay is intended as an aid in the diagnosis of influenza from Nasopharyngeal swab specimens and should not be used as a sole basis for treatment. Nasal washings and aspirates are unacceptable for Xpert Xpress SARS-CoV-2/FLU/RSV testing.  Fact Sheet for Patients: EntrepreneurPulse.com.au  Fact Sheet for Healthcare  Providers: IncredibleEmployment.be  This test is not yet approved or cleared by the [place] FDA and has been authorized for detection and/or diagnosis of SARS-CoV-2 by FDA under an Emergency Use Authorization (EUA). This EUA will remain in effect (meaning this test can be used) for the duration of the COVID-19 declaration under Section 564(b)(1) of the Act, 21 U.S.C. section 360bbb-3(b)(1), unless the authorization is terminated or revoked.  Performed at [facility], [place] [address], [place], [place] [address]       Studies: No results found.  Scheduled Meds:  carbidopa-levodopa  1 tablet Oral TID   enoxaparin (LOVENOX) injection  30 mg Subcutaneous Q24H   fenofibrate  54 mg Oral Daily   midodrine  5 mg Oral TID WC   sertraline  100 mg Oral Daily   simvastatin  10 mg Oral q1800   sodium chloride flush  3 mL Intravenous Q12H    Continuous Infusions:  cefTRIAXone (ROCEPHIN)  IV Stopped (08/12/21 0915)   lactated ringers 50 mL/hr at 08/12/21 0915     LOS: 0 days     Kayleen Memos, MD [facility] Pager [phone]  If 7PM-7AM, please contact night-coverage [email or web address] Password [facility] 08/12/2021, 5:55 PM

## 2021-08-12 NOTE — Evaluation (Signed)
Physical Therapy Evaluation Patient Details Name: Fernando Carr MRN: [id number] DOB: March 12, 1941 Today's Date: 08/12/2021  History of Present Illness  81 y.o. male [facility] ED on 12/21/2022with worsening confusion, refusing medications, poor PO intake, and recent fall. PMH includes bipolar disorder, MDD, HLD, AAA, afib, parkinson's disease.   Clinical Impression  Fernando Carr is 81 y.o. male admitted with above HPI and diagnosis. Patient is currently limited by functional impairments below (see PT problem list). Patient from [place] and over the last year has required increased assist due to limitations and repeated hospitalizations for syncopal episodes. Pt reports typically he uses rollator for mobility. Evaluation limited by orthostatic hypotension with seated SBP 111 and standign SBP 77. Patient will benefit from continued skilled PT interventions to address impairments and progress independence with mobility, recommending SNF rehab to improve independence. Pt will beneifit from SW management to assist with coordinating transition from [place] to ALF. Acute PT will follow and progress as able.        Recommendations for follow up therapy are one component of a multi-disciplinary discharge planning process, led by the attending physician.  Recommendations may be updated based on patient status, additional functional criteria and insurance authorization.  Follow Up Recommendations Skilled nursing-short term rehab (<3 hours/day)    Assistance Recommended at Discharge Intermittent Supervision/Assistance  Patient can return home with the following       Equipment Recommendations None recommended by PT  Recommendations for Other Services       Functional Status Assessment Patient has had a recent decline in their functional status and demonstrates the ability to make significant improvements in function in a reasonable and predictable amount of time.     Precautions / Restrictions  Precautions Precautions: Fall Precaution Comments: history of syncope Restrictions Weight Bearing Restrictions: No      Mobility  Bed Mobility Overal bed mobility: Needs Assistance Bed Mobility: Supine to Sit;Sit to Supine     Supine to sit: Supervision Sit to supine: Supervision   General bed mobility comments: supervision for safety, active tremor noted with movement UE's>LE's    Transfers Overall transfer level: Needs assistance Equipment used: None Transfers: Sit to/from Stand Sit to Stand: Min assist           General transfer comment: Min Assist to steady wiht rise and for safety. cues to return to sit due to dizzy/lightheaded sensation and orthostatic hypotension    Ambulation/Gait               General Gait Details: unsafe to progress due to orthostatics  Stairs            Wheelchair Mobility    Modified Rankin (Stroke Patients Only)       Balance Overall balance assessment: Needs assistance Sitting-balance support: No upper extremity supported;Feet supported Sitting balance-Leahy Scale: Fair     Standing balance support: No upper extremity supported Standing balance-Leahy Scale: Fair                               Pertinent Vitals/Pain Pain Assessment: No/denies pain    Home Living Family/patient expects to be discharged to:: Other (Comment) ([place] living) Living Arrangements: Alone Available Help at Discharge:  (pt is unable to identify any caregiver support) Type of Home: Independent living facility (needs to switch to assisted living unit but says it costs too much) Home Access: Level entry  Home Layout: One level Home Equipment: Rollator (4 wheels);BSC/3in1;Shower seat;Grab bars - tub/shower;Grab bars - toilet;Wheelchair - manual Additional Comments: pt states supposed to be moving to [place] home so he can have more assistance. 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       Hand Dominance   Dominant Hand: Right    Extremity/Trunk Assessment   Upper Extremity Assessment Upper Extremity Assessment: Defer to OT evaluation RUE Deficits / Details: BUE resting tremor, grossly 4/5 strength LUE Deficits / Details: BUE resting tremor, grossly 4/5 strength    Lower Extremity Assessment Lower Extremity Assessment: Generalized weakness    Cervical / Trunk Assessment Cervical / Trunk Assessment: Kyphotic  Communication   Communication: No difficulties  Cognition Arousal/Alertness: Awake/alert Behavior During Therapy: Flat affect Overall Cognitive Status: Impaired/Different from baseline Area of Impairment: Memory;Safety/judgement;Awareness                     Memory: Decreased short-term memory   Safety/Judgement: Decreased awareness of deficits Awareness: Emergent   General Comments: pt with poor use of brakes during transfers, reports confusion last night        General Comments General comments (skin integrity, edema, etc.): Patient positive for orthostatic hypotension in standing systolic reading 77    Exercises     Assessment/Plan    PT Assessment Patient needs continued PT services  PT Problem List Decreased strength;Decreased activity tolerance;Decreased balance;Decreased mobility;Decreased cognition;Decreased coordination;Cardiopulmonary status limiting activity       PT Treatment Interventions DME instruction;Gait training;Functional mobility training;Therapeutic activities;Therapeutic exercise;Balance training;Neuromuscular re-education;Patient/family education    PT Goals (Current goals can be found in the Care Plan section)  Acute Rehab PT Goals Patient Stated Goal: to return to prior level of function and get to  facility with increased assistance available PT Goal Formulation: With patient Time For Goal Achievement: 08/26/21 Potential to Achieve Goals: Good    Frequency Min 2X/week     Co-evaluation               AM-PAC PT "6 Clicks" Mobility  Outcome Measure Help needed turning from your back to your side while in a flat bed without using bedrails?: A Little Help needed moving from lying on your back to sitting on the side of a flat bed without using bedrails?: A Little Help needed moving to and from a bed to a chair (including a wheelchair)?: A Little Help needed standing up from a chair using your arms (e.g., wheelchair or bedside chair)?: A Little Help needed to walk in hospital room?: A Lot Help needed climbing 3-5 steps with a railing? : A Lot 6 Click Score: 16    End of Session Equipment Utilized During Treatment: Gait belt Activity Tolerance: Patient tolerated treatment well Patient left: in bed;with call bell/phone within reach Nurse Communication: Mobility status PT Visit Diagnosis: Other abnormalities of gait and mobility (R26.89);Other symptoms and signs involving the nervous system (R29.898)    Time: 1248-1310 PT Time Calculation (min) (ACUTE ONLY): 22 min   Charges:   PT Evaluation $PT Eval Moderate Complexity: 1 Mod          Verner Mould, DPT Acute Rehabilitation Services Office [phone] Pager [phone]  Jacques Navy 08/12/2021, 4:46 PM

## 2021-08-13 DIAGNOSIS — Z811 Family history of alcohol abuse and dependence: Secondary | ICD-10-CM | POA: Diagnosis not present

## 2021-08-13 DIAGNOSIS — D631 Anemia in chronic kidney disease: Secondary | ICD-10-CM | POA: Diagnosis present

## 2021-08-13 DIAGNOSIS — E7800 Pure hypercholesterolemia, unspecified: Secondary | ICD-10-CM | POA: Diagnosis present

## 2021-08-13 DIAGNOSIS — Z79899 Other long term (current) drug therapy: Secondary | ICD-10-CM | POA: Diagnosis not present

## 2021-08-13 DIAGNOSIS — R531 Weakness: Secondary | ICD-10-CM | POA: Diagnosis not present

## 2021-08-13 DIAGNOSIS — F02818 Dementia in other diseases classified elsewhere, unspecified severity, with other behavioral disturbance: Secondary | ICD-10-CM | POA: Diagnosis present

## 2021-08-13 DIAGNOSIS — F0283 Dementia in other diseases classified elsewhere, unspecified severity, with mood disturbance: Secondary | ICD-10-CM | POA: Diagnosis present

## 2021-08-13 DIAGNOSIS — F319 Bipolar disorder, unspecified: Secondary | ICD-10-CM | POA: Diagnosis present

## 2021-08-13 DIAGNOSIS — F3189 Other bipolar disorder: Secondary | ICD-10-CM | POA: Diagnosis not present

## 2021-08-13 DIAGNOSIS — R296 Repeated falls: Secondary | ICD-10-CM | POA: Diagnosis not present

## 2021-08-13 DIAGNOSIS — Z9181 History of falling: Secondary | ICD-10-CM | POA: Diagnosis not present

## 2021-08-13 DIAGNOSIS — E861 Hypovolemia: Secondary | ICD-10-CM | POA: Diagnosis present

## 2021-08-13 DIAGNOSIS — F0284 Dementia in other diseases classified elsewhere, unspecified severity, with anxiety: Secondary | ICD-10-CM | POA: Diagnosis present

## 2021-08-13 DIAGNOSIS — I480 Paroxysmal atrial fibrillation: Secondary | ICD-10-CM

## 2021-08-13 DIAGNOSIS — I9589 Other hypotension: Secondary | ICD-10-CM | POA: Diagnosis not present

## 2021-08-13 DIAGNOSIS — F17210 Nicotine dependence, cigarettes, uncomplicated: Secondary | ICD-10-CM | POA: Diagnosis present

## 2021-08-13 DIAGNOSIS — R55 Syncope and collapse: Secondary | ICD-10-CM

## 2021-08-13 DIAGNOSIS — Z8679 Personal history of other diseases of the circulatory system: Secondary | ICD-10-CM | POA: Diagnosis not present

## 2021-08-13 DIAGNOSIS — Z7401 Bed confinement status: Secondary | ICD-10-CM | POA: Diagnosis not present

## 2021-08-13 DIAGNOSIS — R2689 Other abnormalities of gait and mobility: Secondary | ICD-10-CM | POA: Diagnosis not present

## 2021-08-13 DIAGNOSIS — R5381 Other malaise: Secondary | ICD-10-CM | POA: Diagnosis present

## 2021-08-13 DIAGNOSIS — Z818 Family history of other mental and behavioral disorders: Secondary | ICD-10-CM | POA: Diagnosis not present

## 2021-08-13 DIAGNOSIS — F419 Anxiety disorder, unspecified: Secondary | ICD-10-CM | POA: Diagnosis not present

## 2021-08-13 DIAGNOSIS — G903 Multi-system degeneration of the autonomic nervous system: Secondary | ICD-10-CM | POA: Diagnosis not present

## 2021-08-13 DIAGNOSIS — E876 Hypokalemia: Secondary | ICD-10-CM | POA: Diagnosis present

## 2021-08-13 DIAGNOSIS — G20 Parkinson's disease: Secondary | ICD-10-CM | POA: Diagnosis not present

## 2021-08-13 DIAGNOSIS — Z96641 Presence of right artificial hip joint: Secondary | ICD-10-CM | POA: Diagnosis present

## 2021-08-13 DIAGNOSIS — U071 COVID-19: Secondary | ICD-10-CM | POA: Diagnosis present

## 2021-08-13 DIAGNOSIS — G908 Other disorders of autonomic nervous system: Secondary | ICD-10-CM | POA: Diagnosis not present

## 2021-08-13 DIAGNOSIS — N179 Acute kidney failure, unspecified: Secondary | ICD-10-CM | POA: Diagnosis present

## 2021-08-13 DIAGNOSIS — N1832 Chronic kidney disease, stage 3b: Secondary | ICD-10-CM | POA: Diagnosis present

## 2021-08-13 DIAGNOSIS — M2550 Pain in unspecified joint: Secondary | ICD-10-CM | POA: Diagnosis not present

## 2021-08-13 DIAGNOSIS — E860 Dehydration: Secondary | ICD-10-CM | POA: Diagnosis present

## 2021-08-13 DIAGNOSIS — M6281 Muscle weakness (generalized): Secondary | ICD-10-CM | POA: Diagnosis not present

## 2021-08-13 LAB — GLUCOSE, CAPILLARY: Glucose-Capillary: 86 mg/dL (ref 70–99)

## 2021-08-13 MED ORDER — SODIUM CHLORIDE 0.9 % IV SOLN: INTRAVENOUS | Status: AC

## 2021-08-13 MED ORDER — MOLNUPIRAVIR EUA 200MG CAPSULE
4.0000 | ORAL_CAPSULE | Freq: Two times a day (BID) | ORAL | Status: AC
Start: 2021-08-13 — End: 2021-08-18
  Administered 2021-08-13 – 2021-08-18 (×10): 800 mg via ORAL
  Filled 2021-08-13 (×2): qty 4

## 2021-08-13 MED ORDER — MIDODRINE HCL 5 MG PO TABS
10.0000 mg | ORAL_TABLET | Freq: Three times a day (TID) | ORAL | Status: DC
  Administered 2021-08-13 – 2021-08-25 (×32): 10 mg via ORAL
  Filled 2021-08-13 (×38): qty 2

## 2021-08-13 MED ORDER — SODIUM CHLORIDE 0.9 % IV SOLN: INTRAVENOUS | Status: DC

## 2021-08-13 MED ORDER — LACTATED RINGERS IV SOLN: INTRAVENOUS | Status: DC

## 2021-08-13 NOTE — Progress Notes (Addendum)
PROGRESS NOTE  Klayton Monie Kreutzer NTZ:[id number] DOB: 1941/03/16 DOA: 08/11/2021 PCP: [name], MD  HPI/Recap of past 24 hours: Alfredo Batty Skibicki is a 81 y.o. male with medical history significant for Parkinson's disease, paroxysmal A. fib not on anticoagulation due to frequent falls, recurrent syncope, bipolar disorder type 1, CKD 3B who presented to [facility] ED after briefly losing consciousness.  Witnessed by nursing staff.  EMS was activated and also witnessed 3 syncopal episodes.  No postictal symptoms.  Patient endorses that he felt weak prior to passing out.  No chest pain or palpitations.  States he did not have much to drink or eat today.  He was in his usual state of health prior to this.  At the time of this interview, he is alert and oriented x 3.  In the ED work up revealed hypovolemia and AKI.  Received 500 cc IV fluid bolus normal saline.  EDP requested admission for recurrent syncope.  Admitted by hospitalist service.  UA positive for pyuria, started on Rocephin.  COVID-19 viral infection positive on 08/11/2021.   08/13/2021: Patient was seen at his bedside.  Still significantly orthostatic with change in position.      Assessment/Plan: Principal Problem:   Syncope  Recurrent syncope, suspect secondary to hypovolemia and significant orthostatic hypotension Hypovolemic on exam, dehydrated Prodrome, weakness prior to losing consciousness Positive orthostatic vital signs. PT recommending SNF. Continue home midodrine, abdominal binders, TED hose. Continue gentle IV fluid hydration and fall precautions Patient has followed with Dr. Percival Spanish outpatient Cardiology consulted  COVID-19 viral infection Not hypoxic, with saturation 96% on room air. Start Molnupiravir Follow inflammatory markers   Improving AKI on CKD 3B, prerenal secondary to dehydration Baseline creatinine appears to be 2.0 with GFR of 31 Presented with creatinine of 2.95 with GFR of 21. Creatinine is downtrending 2.11  from 2.66 Avoid nephrotoxic agents, dehydration and hypotension. Closely monitor urine output with strict I's and O's Repeat renal panel in the morning.   Parkinson's disease Continue home regimen, on Sinemet Continue fall precaution   Autonomic dysfunction in the setting of Parkinson's disease with recurrent syncope Continue home regimen, on midodrine   Hyperlipidemia Resume home regimen on fenofibrate and simvastatin.   Chronic anxiety/depression/bipolar disorder Resume home regimen   Physical debility PT OT recommending SNF Continue fall precautions   Paroxysmal A. fib, not on oral anticoagulation due to frequent falls Monitor on telemetry   Anemia of chronic disease in the setting of CKD Hemoglobin is at baseline No overt bleeding      DVT prophylaxis: Subcu Lovenox daily   Code Status: Full code   Family Communication: None at bedside   Disposition Plan: Likely will return to [employer] called: Cardiology   Admission status: Inpatient status.     Status is: Patient requires at least 2 midnights for further evaluation and treatment of present condition.         Objective: Vitals:   08/13/21 0455 08/13/21 0921 08/13/21 1130 08/13/21 1143  BP: (!) 164/78 (!) 151/96 135/72 97/72  Pulse: (!) 55 (!) 58 62 (!) 54  Resp: 20     Temp: 97.8 F (36.6 C)  98.1 F (36.7 C)   TempSrc:      SpO2: 95%  96%     Intake/Output Summary (Last 24 hours) at 08/13/2021 1604 Last data filed at 08/13/2021 1419 Gross per 24 hour  Intake 250 ml  Output 800 ml  Net -550 ml   There were  no vitals filed for this visit.  Exam:  General: 81 y.o. year-old male Well developed and well nourished.  Alert and oriented x3 Cardiovascular: RRR no rubs or gallops. Respiratory: Clear to auscultation no wheezes or rales. Abdomen: Soft nontender nondistended with normal bowel sounds x4 quadrants. Musculoskeletal: No lower extremity edema bilaterally.   Skin: No  ulcerative lesions noted or rashes, Psychiatry: Mood is appropriate for condition and setting.   Data Reviewed: CBC: Recent Labs  Lab 08/11/21 1446 08/11/21 1800 08/12/21 0525  WBC 7.8 5.9 4.7  HGB 10.5* 9.6* 10.2*  HCT 34.2* 31.3* 33.8*  MCV 91.7 89.7 90.9  PLT 196 170 828   Basic Metabolic Panel: Recent Labs  Lab 08/11/21 1446 08/11/21 1800 08/12/21 0525  NA 139  --  140  K 4.5  --  4.4  CL 108  --  107  CO2 22  --  25  GLUCOSE 97  --  96  BUN 52*  --  50*  CREATININE 2.95* 2.66* 2.11*  CALCIUM 10.1  --  10.1  MG  --   --  2.2  PHOS  --   --  3.3   GFR: CrCl cannot be calculated (Unknown ideal weight.). Liver Function Tests: Recent Labs  Lab 08/12/21 0525  ALBUMIN 3.6   No results for input(s): LIPASE, AMYLASE in the last 168 hours. No results for input(s): AMMONIA in the last 168 hours. Coagulation Profile: No results for input(s): INR, PROTIME in the last 168 hours. Cardiac Enzymes: No results for input(s): CKTOTAL, CKMB, CKMBINDEX, TROPONINI in the last 168 hours. BNP (last 3 results) No results for input(s): PROBNP in the last 8760 hours. HbA1C: No results for input(s): HGBA1C in the last 72 hours. CBG: Recent Labs  Lab 08/11/21 1403 08/12/21 0644 08/13/21 0809  GLUCAP 116* 87 86   Lipid Profile: No results for input(s): CHOL, HDL, LDLCALC, TRIG, CHOLHDL, LDLDIRECT in the last 72 hours. Thyroid Function Tests: No results for input(s): TSH, T4TOTAL, FREET4, T3FREE, THYROIDAB in the last 72 hours. Anemia Panel: No results for input(s): VITAMINB12, FOLATE, FERRITIN, TIBC, IRON, RETICCTPCT in the last 72 hours. Urine analysis:    Component Value Date/Time   COLORURINE YELLOW 08/11/2021 2130   APPEARANCEUR CLOUDY (A) 08/11/2021 2130   LABSPEC 1.015 08/11/2021 2130   PHURINE 5.0 08/11/2021 2130   GLUCOSEU 50 (A) 08/11/2021 2130   HGBUR NEGATIVE 08/11/2021 2130   BILIRUBINUR NEGATIVE 08/11/2021 2130   [place] NEGATIVE 08/11/2021 2130    PROTEINUR 30 (A) 08/11/2021 2130   UROBILINOGEN 1.0 08/19/2014 1745   NITRITE POSITIVE (A) 08/11/2021 2130   LEUKOCYTESUR LARGE (A) 08/11/2021 2130   Sepsis Labs: @LABRCNTIP (procalcitonin:4,lacticidven:4)  ) Recent Results (from the past 240 hour(s))  Resp Panel by RT-PCR (Flu A&B, Covid) Nasopharyngeal Swab     Status: Abnormal   Collection Time: 08/11/21  5:24 PM   Specimen: Nasopharyngeal Swab; Nasopharyngeal(NP) swabs in vial transport medium  Result Value Ref Range Status   SARS Coronavirus 2 by RT PCR POSITIVE (A) NEGATIVE Final    Comment: (NOTE) SARS-CoV-2 target nucleic acids are DETECTED.  The SARS-CoV-2 RNA is generally detectable in upper respiratory specimens during the acute phase of infection. Positive results are indicative of the presence of the identified virus, but do not rule out bacterial infection or co-infection with other pathogens not detected by the test. Clinical correlation with patient history and other diagnostic information is necessary to determine patient infection status. The expected result is Negative.  Fact Sheet for  Patients: EntrepreneurPulse.com.au  Fact Sheet for Healthcare Providers: IncredibleEmployment.be  This test is not yet approved or cleared by the [place] FDA and  has been authorized for detection and/or diagnosis of SARS-CoV-2 by FDA under an Emergency Use Authorization (EUA).  This EUA will remain in effect (meaning this test can be used) for the duration of  the COVID-19 declaration under Section 564(b)(1) of the A ct, 21 U.S.C. section 360bbb-3(b)(1), unless the authorization is terminated or revoked sooner.     Influenza A by PCR NEGATIVE NEGATIVE Final   Influenza B by PCR NEGATIVE NEGATIVE Final    Comment: (NOTE) The Xpert Xpress SARS-CoV-2/FLU/RSV plus assay is intended as an aid in the diagnosis of influenza from Nasopharyngeal swab specimens and should not be used as a  sole basis for treatment. Nasal washings and aspirates are unacceptable for Xpert Xpress SARS-CoV-2/FLU/RSV testing.  Fact Sheet for Patients: EntrepreneurPulse.com.au  Fact Sheet for Healthcare Providers: IncredibleEmployment.be  This test is not yet approved or cleared by the [place] FDA and has been authorized for detection and/or diagnosis of SARS-CoV-2 by FDA under an Emergency Use Authorization (EUA). This EUA will remain in effect (meaning this test can be used) for the duration of the COVID-19 declaration under Section 564(b)(1) of the Act, 21 U.S.C. section 360bbb-3(b)(1), unless the authorization is terminated or revoked.  Performed at [facility], [place] [address], [place], [place] [address]       Studies: No results found.  Scheduled Meds:  carbidopa-levodopa  1 tablet Oral TID   enoxaparin (LOVENOX) injection  30 mg Subcutaneous Q24H   fenofibrate  54 mg Oral Daily   midodrine  5 mg Oral TID WC   sertraline  100 mg Oral Daily   simvastatin  10 mg Oral q1800   sodium chloride flush  3 mL Intravenous Q12H    Continuous Infusions:  cefTRIAXone (ROCEPHIN)  IV 1 g (08/13/21 0912)   lactated ringers 50 mL/hr at 08/13/21 1213     LOS: 0 days     Kayleen Memos, MD [facility] Pager [phone]  If 7PM-7AM, please contact night-coverage [email or web address] Password [facility] 08/13/2021, 4:04 PM

## 2021-08-13 NOTE — Progress Notes (Signed)
Pt is injury-free, afebrile, alert, and oriented X 3. Vital signs were within the baseline during this shift. Pt oxygen saturation is stable in mid 90's on 2 L . Pt denies chest pain, SOB, nausea, vomiting, dizziness, signs or symptoms of bleeding, or acute changes during this shift. We will continue to monitor and work toward achieving the care plan goals.
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
## 2021-08-13 NOTE — Plan of Care (Deleted)
°  Problem: Education: Goal: Knowledge of General Education information will improve Description: Including pain rating scale, medication(s)/side effects and non-pharmacologic comfort measures Outcome: Not Progressing   Problem: Health Behavior/Discharge Planning: Goal: Ability to manage health-related needs will improve Outcome: Not Progressing   Problem: Clinical Measurements: Goal: Ability to maintain clinical measurements within normal limits will improve Outcome: Progressing Goal: Will remain free from infection Outcome: Progressing Goal: Diagnostic test results will improve Outcome: Progressing Goal: Respiratory complications will improve Outcome: Progressing Goal: Cardiovascular complication will be avoided Outcome: Progressing   Problem: Activity: Goal: Risk for activity intolerance will decrease Outcome: Not Progressing   Problem: Nutrition: Goal: Adequate nutrition will be maintained Outcome: Not Progressing   Problem: Elimination: Goal: Will not experience complications related to bowel motility Outcome: Progressing Goal: Will not experience complications related to urinary retention Outcome: Progressing   Problem: Pain Managment: Goal: General experience of comfort will improve Outcome: Progressing   Problem: Safety: Goal: Ability to remain free from injury will improve Outcome: Progressing   Problem: Skin Integrity: Goal: Risk for impaired skin integrity will decrease Outcome: Progressing

## 2021-08-13 NOTE — Progress Notes (Signed)
Spoke w/ Anderson Malta patients private care manger and she stated that patient was moving from [facility] independent living to assisted living facility at [facility]. The nurse at [facility] will need to come out and re-assess patient before discharge. Please let Anderson Malta know when patient is ready for discharge at [phone]  so she can contact [facility].

## 2021-08-13 NOTE — Progress Notes (Signed)
Physical Therapy Treatment Patient Details Name: Fernando Carr MRN: [id number] DOB: 13-May-1941 Today's Date: 08/13/2021   History of Present Illness 81 y.o. male presents to [facility] ED on 12/21/2022with worsening confusion, refusing medications, poor PO intake, and recent fall. PMH includes bipolar disorder, MDD, HLD, AAA, afib, parkinson's disease.    PT Comments    Patient continues to be limited by orthostatic hypotension (see below) and reports by the time he develops symptoms he is usually already passing out. He is very frustrated that they cannot figure out what is going on with him. Pt required increased assist or Mod A for sit<>stand transfer today and min assist to step to recliner with +2 for safety. Unsafe to progress gait with orthostatic drop in standing.  He does not have enough assistance at [facility] and will need greater assistance for transfer, gait, ADL's. He will need SNF placement with transition to ALF or TOC team will need to assist pt in discharging to ALF and have HHPT follow up. He reports he cannot afford the ALF at [facility] and needs help with other options. Acute PT will progress pt as able.    Orthostatic VS for the past 24 hrs:  BP- Lying Pulse- Lying BP- Sitting Pulse- Sitting BP- Standing at 0 minutes Pulse- Standing at 0 minutes  08/13/21 1132 135/72 62 (!) 143/104 58 (!) 75/50 62   Seated after transfer to recliner:  08/13/21 1143  Vital Signs  Pulse Rate (!) 54  Pulse Rate Source Dinamap  BP 97/72  BP Location Left Arm  BP Method Automatic  Patient Position (if appropriate) Sitting     Recommendations for follow up therapy are one component of a multi-disciplinary discharge planning process, led by the attending physician.  Recommendations may be updated based on patient status, additional functional criteria and insurance authorization.  Follow Up Recommendations  Skilled nursing-short term rehab (<3 hours/day) (Discharge to ALF (not ILF)  with HHPT.)     Assistance Recommended at Discharge Intermittent Supervision/Assistance  Patient can return home with the following     Equipment Recommendations  Wheelchair cushion (measurements PT);Wheelchair (measurements PT)    Recommendations for Other Services       Precautions / Restrictions Precautions Precautions: Fall Precaution Comments: history of syncope (5th admission in last 6 months for this) Restrictions Weight Bearing Restrictions: No     Mobility  Bed Mobility Overal bed mobility: Needs Assistance Bed Mobility: Supine to Sit     Supine to sit: HOB elevated;Min guard     General bed mobility comments: Min guard and pt taking extra time to sit up to EOB    Transfers Overall transfer level: Needs assistance Equipment used: Rollator (4 wheels) Transfers: Sit to/from Stand;Bed to chair/wheelchair/BSC Sit to Stand: Mod assist;+2 safety/equipment     Step pivot transfers: Min assist;+2 safety/equipment     General transfer comment: Mod assist for power up this date and cues needed for hand placement to use Bil UE's to press up from EOB. Min assist to manage walker and turn to sit in recliner.    Ambulation/Gait               General Gait Details: unsafe to progress due to orthostatics   Stairs             Wheelchair Mobility    Modified Rankin (Stroke Patients Only)       Balance Overall balance assessment: Needs assistance Sitting-balance support: No upper extremity supported;Feet supported Sitting  balance-Leahy Scale: Fair     Standing balance support: Single extremity supported;Reliant on assistive device for balance Standing balance-Leahy Scale: Poor                              Cognition Arousal/Alertness: Awake/alert Behavior During Therapy: Flat affect Overall Cognitive Status: Impaired/Different from baseline Area of Impairment: Memory;Safety/judgement;Awareness                     Memory:  Decreased short-term memory   Safety/Judgement: Decreased awareness of deficits Awareness: Emergent   General Comments: pt with poor use of brakes during transfers, reports confusion last night        Exercises      General Comments        Pertinent Vitals/Pain Pain Assessment: No/denies pain    Home Living Family/patient expects to be discharged to:: Other (Comment) ([facility]) Living Arrangements: Alone Available Help at Discharge:  (pt is unable to identify any caregiver support) Type of Home: Independent living facility (needs to switch to assisted living unit but says it costs too much) Home Access: Level entry       Home Layout: One level Home Equipment: Rollator (4 wheels);BSC/3in1;Shower seat;Grab bars - tub/shower;Grab bars - toilet;Wheelchair - manual Additional Comments: pt states supposed to be moving to [place] home so he can have more assistance. 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, museum for elementary schools    Prior Function            PT Goals (current goals can now be found in the care plan section) Acute Rehab PT Goals Patient Stated Goal: to return to prior level of function PT Goal Formulation: With patient Time For Goal Achievement: 08/07/21 Potential to Achieve Goals: Fair Progress towards PT goals: Progressing toward goals    Frequency    Min 2X/week      PT Plan Current plan remains appropriate    Co-evaluation              AM-PAC PT "6 Clicks" Mobility   Outcome Measure  Help needed turning from your back to your side while in a flat bed without using bedrails?: A Little Help needed moving from lying on your back to sitting on the side of a flat bed without using bedrails?: A Little Help needed moving to and from a bed to a chair (including a wheelchair)?: A Lot Help needed standing up from a chair using your arms (e.g., wheelchair or bedside chair)?: A Lot Help needed to walk in hospital room?: A Lot Help  needed climbing 3-5 steps with a railing? : A Lot 6 Click Score: 14    End of Session Equipment Utilized During Treatment: Gait belt Activity Tolerance: Treatment limited secondary to medical complications (Comment) Patient left: in bed;with call bell/phone within reach;with bed alarm set Nurse Communication: Mobility status PT Visit Diagnosis: Other abnormalities of gait and mobility (R26.89);Other symptoms and signs involving the nervous system (E17.408)     Time: 1448-1856 PT Time Calculation (min) (ACUTE ONLY): 28 min  Charges:  $Therapeutic Activity: 23-37 mins                     Verner Mould, DPT Acute Rehabilitation Services Office [phone] Pager [phone]    Jacques Navy 08/13/2021, 12:56 PM

## 2021-08-14 DIAGNOSIS — G903 Multi-system degeneration of the autonomic nervous system: Principal | ICD-10-CM

## 2021-08-14 LAB — COMPREHENSIVE METABOLIC PANEL
ALT: <5 U/L (ref 0–44)
AST: 11 U/L — ABNORMAL LOW (ref 15–41)
Albumin: 3.3 g/dL — ABNORMAL LOW (ref 3.5–5.0)
Alkaline Phosphatase: 77 U/L (ref 38–126)
Anion gap: 7 (ref 5–15)
BUN: 42 mg/dL — ABNORMAL HIGH (ref 8–23)
CO2: 25 mmol/L (ref 22–32)
Calcium: 10 mg/dL (ref 8.9–10.3)
Chloride: 106 mmol/L (ref 98–111)
Creatinine, Ser: 1.39 mg/dL — ABNORMAL HIGH (ref 0.61–1.24)
GFR, Estimated: 51 mL/min — ABNORMAL LOW (ref >60)
Glucose, Bld: 85 mg/dL (ref 70–99)
Potassium: 4.5 mmol/L (ref 3.5–5.1)
Sodium: 138 mmol/L (ref 135–145)
Total Bilirubin: 0.4 mg/dL (ref 0.3–1.2)
Total Protein: 6.5 g/dL (ref 6.5–8.1)

## 2021-08-14 LAB — CBC WITH DIFFERENTIAL/PLATELET
Abs Immature Granulocytes: 0.04 10*3/uL (ref 0.00–0.07)
Basophils Absolute: 0 10*3/uL (ref 0.0–0.1)
Basophils Relative: 0 %
Eosinophils Absolute: 0.1 10*3/uL (ref 0.0–0.5)
Eosinophils Relative: 1 %
HCT: 33.5 % — ABNORMAL LOW (ref 39.0–52.0)
Hemoglobin: 10 g/dL — ABNORMAL LOW (ref 13.0–17.0)
Immature Granulocytes: 1 %
Lymphocytes Relative: 26 %
Lymphs Abs: 1.4 10*3/uL (ref 0.7–4.0)
MCH: 26.9 pg (ref 26.0–34.0)
MCHC: 29.9 g/dL — ABNORMAL LOW (ref 30.0–36.0)
MCV: 90.1 fL (ref 80.0–100.0)
Monocytes Absolute: 0.6 10*3/uL (ref 0.1–1.0)
Monocytes Relative: 11 %
Neutro Abs: 3.3 10*3/uL (ref 1.7–7.7)
Neutrophils Relative %: 61 %
Platelets: 178 10*3/uL (ref 150–400)
RBC: 3.72 MIL/uL — ABNORMAL LOW (ref 4.22–5.81)
RDW: 12.7 % (ref 11.5–15.5)
WBC: 5.3 10*3/uL (ref 4.0–10.5)
nRBC: 0 % (ref 0.0–0.2)

## 2021-08-14 LAB — GLUCOSE, CAPILLARY: Glucose-Capillary: 101 mg/dL — ABNORMAL HIGH (ref 70–99)

## 2021-08-14 LAB — C-REACTIVE PROTEIN: CRP: 0.6 mg/dL (ref <1.0)

## 2021-08-14 LAB — D-DIMER, QUANTITATIVE: D-Dimer, Quant: 1.71 ug/mL-FEU — ABNORMAL HIGH (ref 0.00–0.50)

## 2021-08-14 NOTE — Plan of Care (Signed)

## 2021-08-14 NOTE — Progress Notes (Signed)
PROGRESS NOTE  Fernando Carr Card ONG:[id number] DOB: 1941/06/04 DOA: 08/11/2021 PCP: [name], MD  HPI/Recap of past 24 hours: Fernando Carr is a 81 y.o. male with medical history significant for Parkinson's disease, paroxysmal A. fib not on anticoagulation due to frequent falls, recurrent syncope, bipolar disorder type 1, CKD 3B who presented to [facility] ED after briefly losing consciousness.  Witnessed by nursing staff.  EMS was activated and also witnessed 3 syncopal episodes.  No postictal symptoms.  Patient endorses that he felt weak prior to passing out.  No chest pain or palpitations.  States he did not have much to drink or eat today.  He was in his usual state of health prior to this.  At the time of this interview, he is alert and oriented x 3.  In the ED work up revealed hypovolemia and AKI.  Received 500 cc IV fluid bolus normal saline.  EDP requested admission for recurrent syncope.  Admitted by hospitalist service.  UA positive for pyuria, started on Rocephin.  COVID-19 viral infection positive on 08/11/2021.   08/14/2021: Patient seen at his bedside.  He is on normal saline due to orthostatic hypotension and autonomic dysfunction from Parkinson's disease.  Also on midodrine.  Ongoing daily orthostatic vital signs.     Assessment/Plan: Principal Problem:   Syncope Active Problems:   Neurogenic orthostatic hypotension (HCC)  Recurrent syncope, suspect secondary to hypovolemia and significant orthostatic hypotension Hypovolemic on exam, dehydrated Prodrome, weakness prior to losing consciousness Positive orthostatic vital signs. PT recommending SNF. Continue home midodrine, abdominal binders, TED hose. Continue gentle IV fluid hydration and fall precautions Patient has followed with Dr. 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 Start Molnupiravir Follow inflammatory markers   Improving AKI on CKD 3B, prerenal secondary to dehydration Baseline creatinine appears to be 2.0 with GFR of 31 Presented with creatinine of 2.95 with GFR of 21. Creatinine is downtrending 2.11 from 2.66 Avoid nephrotoxic agents, dehydration and hypotension. Closely monitor urine output with strict I's and O's Repeat renal panel in the morning.   Parkinson's disease Continue home regimen, on Sinemet Continue fall precaution   Hyperlipidemia Continue home regimen on fenofibrate and simvastatin.   Chronic anxiety/depression/bipolar disorder Continue home regimen   Physical debility PT OT recommending SNF Continue fall precautions   Paroxysmal A. fib, not on oral anticoagulation due to frequent falls Monitor on telemetry   Anemia of chronic disease in the setting of CKD Hemoglobin is at baseline No overt bleeding      DVT prophylaxis: Subcu Lovenox daily   Code Status: Full code   Family Communication: None at bedside   Disposition Plan: Likely will return to [employer] called: Cardiology   Admission status: Inpatient status.     Status is: Patient requires at least 2 midnights for further evaluation and treatment of present condition.         Objective: Vitals:   08/13/21 1654 08/13/21 2030 08/14/21 0543 08/14/21 0600  BP: (!) 155/95 (!) 158/98 (!) 181/78 (!) 183/82  Pulse: 61 61 (!) 53 (!) 54  Resp:      Temp:  98.1 F (36.7 C) (!) 97.5 F (36.4 C)   TempSrc:  Oral Oral   SpO2:  95%     Intake/Output Summary (Last 24 hours) at 08/14/2021 1213 Last data filed at 08/14/2021 0900 Gross per 24 hour  Intake 1166.83 ml  Output 1750 ml  Net -583.17 ml   There were no vitals filed for this visit.  Exam:  General: 81 y.o. year-old male well-developed  well-nourished in NO apparent distress.  He is alert oriented x3.   Cardiovascular: Regular rate and rhythm no rubs or gallops. Respiratory: Clear to auscultation with no wheezes or rales. Abdomen: Soft nontender normal bowel sounds present.   Musculoskeletal: No lower extremity edema bilaterally. Skin: No ulcerative lesions noted.   Psychiatry: Mood is appropriate for condition and setting.   Data Reviewed: CBC: Recent Labs  Lab 08/11/21 1446 08/11/21 1800 08/12/21 0525 08/14/21 0550  WBC 7.8 5.9 4.7 5.3  NEUTROABS  --   --   --  3.3  HGB 10.5* 9.6* 10.2* 10.0*  HCT 34.2* 31.3* 33.8* 33.5*  MCV 91.7 89.7 90.9 90.1  PLT 196 170 166 275   Basic Metabolic Panel: Recent Labs  Lab 08/11/21 1446 08/11/21 1800 08/12/21 0525 08/14/21 0550  NA 139  --  140 138  K 4.5  --  4.4 4.5  CL 108  --  107 106  CO2 22  --  25 25  GLUCOSE 97  --  96 85  BUN 52*  --  50* 42*  CREATININE 2.95* 2.66* 2.11* 1.39*  CALCIUM 10.1  --  10.1 10.0  MG  --   --  2.2  --   PHOS  --   --  3.3  --    GFR: CrCl cannot be calculated (Unknown ideal weight.). Liver Function Tests: Recent Labs  Lab 08/12/21 0525 08/14/21 0550  AST  --  11*  ALT  --  <5  ALKPHOS  --  77  BILITOT  --  0.4  PROT  --  6.5  ALBUMIN 3.6 3.3*   No results for input(s): LIPASE, AMYLASE in the last 168 hours. No results for input(s): AMMONIA in the last 168 hours. Coagulation Profile: No results for input(s): INR, PROTIME in the last 168 hours. Cardiac Enzymes: No results for input(s): CKTOTAL, CKMB, CKMBINDEX, TROPONINI in the last 168 hours. BNP (last 3 results) No results for input(s): PROBNP in the last 8760 hours. HbA1C: No results for input(s): HGBA1C in the last 72 hours. CBG: Recent Labs  Lab 08/11/21 1403 08/12/21 0644 08/13/21 0809 08/14/21 0643  GLUCAP 116* 87 86 101*   Lipid Profile: No results for input(s): CHOL, HDL, LDLCALC, TRIG, CHOLHDL, LDLDIRECT in the last 72 hours. Thyroid Function  Tests: No results for input(s): TSH, T4TOTAL, FREET4, T3FREE, THYROIDAB in the last 72 hours. Anemia Panel: No results for input(s): VITAMINB12, FOLATE, FERRITIN, TIBC, IRON, RETICCTPCT in the last 72 hours. Urine analysis:    Component Value Date/Time   COLORURINE YELLOW 08/11/2021 2130   APPEARANCEUR CLOUDY (A) 08/11/2021 2130   LABSPEC 1.015 08/11/2021 2130   PHURINE 5.0 08/11/2021 2130   GLUCOSEU 50 (A) 08/11/2021 2130   HGBUR NEGATIVE 08/11/2021 2130   BILIRUBINUR NEGATIVE 08/11/2021 2130   [place] NEGATIVE 08/11/2021 2130   PROTEINUR 30 (A) 08/11/2021 2130   UROBILINOGEN 1.0 08/19/2014 1745   NITRITE POSITIVE (A) 08/11/2021 2130   LEUKOCYTESUR LARGE (A) 08/11/2021 2130   Sepsis Labs: @LABRCNTIP (procalcitonin:4,lacticidven:4)  ) Recent Results (from the past 240 hour(s))  Resp Panel by RT-PCR (Flu A&B, Covid) Nasopharyngeal Swab     Status: Abnormal   Collection  Time: 08/11/21  5:24 PM   Specimen: Nasopharyngeal Swab; Nasopharyngeal(NP) swabs in vial transport medium  Result Value Ref Range Status   SARS Coronavirus 2 by RT PCR POSITIVE (A) NEGATIVE Final    Comment: (NOTE) SARS-CoV-2 target nucleic acids are DETECTED.  The SARS-CoV-2 RNA is generally detectable in upper respiratory specimens during the acute phase of infection. Positive results are indicative of the presence of the identified virus, but do not rule out bacterial infection or co-infection with other pathogens not detected by the test. Clinical correlation with patient history and other diagnostic information is necessary to determine patient infection status. The expected result is Negative.  Fact Sheet for Patients: EntrepreneurPulse.com.au  Fact Sheet for Healthcare Providers: IncredibleEmployment.be  This test is not yet approved or cleared by the [place] FDA and  has been authorized for detection and/or diagnosis of SARS-CoV-2 by FDA under an  Emergency Use Authorization (EUA).  This EUA will remain in effect (meaning this test can be used) for the duration of  the COVID-19 declaration under Section 564(b)(1) of the A ct, 21 U.S.C. section 360bbb-3(b)(1), unless the authorization is terminated or revoked sooner.     Influenza A by PCR NEGATIVE NEGATIVE Final   Influenza B by PCR NEGATIVE NEGATIVE Final    Comment: (NOTE) The Xpert Xpress SARS-CoV-2/FLU/RSV plus assay is intended as an aid in the diagnosis of influenza from Nasopharyngeal swab specimens and should not be used as a sole basis for treatment. Nasal washings and aspirates are unacceptable for Xpert Xpress SARS-CoV-2/FLU/RSV testing.  Fact Sheet for Patients: EntrepreneurPulse.com.au  Fact Sheet for Healthcare Providers: IncredibleEmployment.be  This test is not yet approved or cleared by the [place] FDA and has been authorized for detection and/or diagnosis of SARS-CoV-2 by FDA under an Emergency Use Authorization (EUA). This EUA will remain in effect (meaning this test can be used) for the duration of the COVID-19 declaration under Section 564(b)(1) of the Act, 21 U.S.C. section 360bbb-3(b)(1), unless the authorization is terminated or revoked.  Performed at [facility], [place] [address] [place],  [address]       Studies: No results found.  Scheduled Meds:  carbidopa-levodopa  1 tablet Oral TID   enoxaparin (LOVENOX) injection  30 mg Subcutaneous Q24H   fenofibrate  54 mg Oral Daily   midodrine  10 mg Oral TID WC   molnupiravir EUA  4 capsule Oral BID   sertraline  100 mg Oral Daily   simvastatin  10 mg Oral q1800   sodium chloride flush  3 mL Intravenous Q12H    Continuous Infusions:  sodium chloride 75 mL/hr at 08/13/21 1659     LOS: 1 day     Kayleen Memos, MD [facility] Pager [phone]  If 7PM-7AM, please contact  night-coverage [email or web address] Password [facility] 08/14/2021, 12:13 PM

## 2021-08-14 NOTE — Progress Notes (Signed)
Pt is injury-free, afebrile, alert, and oriented X 3. Pt's BP was 182/81 this morning and asymptomatic, the healthcare provider on coverage was notified. The rest of vital signs were within the baseline during this shift. Pt denies chest pain, SOB, nausea, vomiting, dizziness, signs or symptoms of bleeding, or acute changes during this shift. We will continue to monitor and work toward achieving the care plan goals

## 2021-08-15 LAB — BASIC METABOLIC PANEL
Anion gap: 7 (ref 5–15)
BUN: 39 mg/dL — ABNORMAL HIGH (ref 8–23)
CO2: 26 mmol/L (ref 22–32)
Calcium: 10 mg/dL (ref 8.9–10.3)
Chloride: 104 mmol/L (ref 98–111)
Creatinine, Ser: 1.29 mg/dL — ABNORMAL HIGH (ref 0.61–1.24)
GFR, Estimated: 56 mL/min — ABNORMAL LOW (ref >60)
Glucose, Bld: 86 mg/dL (ref 70–99)
Potassium: 4.1 mmol/L (ref 3.5–5.1)
Sodium: 137 mmol/L (ref 135–145)

## 2021-08-15 LAB — GLUCOSE, CAPILLARY: Glucose-Capillary: 84 mg/dL (ref 70–99)

## 2021-08-15 LAB — PHOSPHORUS: Phosphorus: 3.1 mg/dL (ref 2.5–4.6)

## 2021-08-15 LAB — MAGNESIUM: Magnesium: 2 mg/dL (ref 1.7–2.4)

## 2021-08-15 MED ORDER — HYDRALAZINE HCL 20 MG/ML IJ SOLN
5.0000 mg | Freq: Once | INTRAMUSCULAR | Status: AC
  Administered 2021-08-15: 5 mg via INTRAVENOUS
  Filled 2021-08-15: qty 1

## 2021-08-15 NOTE — TOC Progression Note (Signed)
Transition of Care [facility]) - Progression Note    Patient Details  Name: Fernando Carr MRN: [id number] Date of Birth: 03-Jun-1941  Transition of Care [facility] CM/SW Contact  Ross Ludwig, [place] Phone Number: 08/15/2021, 7:09 PM  Clinical Narrative:    Patient is from [facility].  PT now recommending SNF placement.  CSW to work on SNF placement for patient if he is agreeable.        Expected Discharge Plan and Services                                                 Social Determinants of Health (SDOH) Interventions    Readmission Risk Interventions No flowsheet data found.

## 2021-08-15 NOTE — Progress Notes (Signed)
PROGRESS NOTE  Fernando Carr LZJ:[id number] DOB: 08/04/1940 DOA: 08/11/2021 PCP: [name], MD  HPI/Recap of past 24 hours: Fernando Carr is a 81 y.o. male with medical history significant for Parkinson's disease, paroxysmal A. fib not on anticoagulation due to frequent falls, recurrent syncope, bipolar disorder type 1, CKD 3B who presented to [facility] ED after briefly losing consciousness.  Witnessed by nursing staff.  EMS was activated and also witnessed 3 syncopal episodes.  No postictal symptoms.  Patient endorses that he felt weak prior to passing out.  No chest pain or palpitations.  States he did not have much to drink or eat today.  He was in his usual state of health prior to this.  At the time of this interview, he is alert and oriented x 3.  In the ED work up revealed hypovolemia and AKI.  Received 500 cc IV fluid bolus normal saline.  EDP requested admission for recurrent syncope.  Admitted by hospitalist service.  UA positive for pyuria, started on Rocephin.  COVID-19 viral infection positive on 08/11/2021.    Orthostatic hypotension and autonomic dysfunction from Parkinson's disease.    08/15/2021: No new complaints.  Weak appearing.  Orthosthatic vital signs pending.     Assessment/Plan: Principal Problem:   Syncope Active Problems:   Neurogenic orthostatic hypotension (HCC)  Recurrent syncope, suspect secondary to hypovolemia and significant orthostatic hypotension Hypovolemic on exam, dehydrated Prodrome, weakness prior to losing consciousness Positive orthostatic vital signs. PT recommending SNF. Continue home midodrine, abdominal binders, TED hose. Continue gentle IV fluid hydration and fall precautions Patient has followed with Dr. Percival Carr outpatient Cardiology consulted  Autonomic dysfunction in the setting of Parkinson's disease with recurrent syncope Continue normal saline at 75 cc per hour Orthostatic vital signs daily Fall precautions Abdominal  binder Midodrine TED hose  COVID-19 viral infection Not hypoxic, with saturation 96% on room air. Start Molnupiravir Follow inflammatory markers   Improving AKI on CKD 3B, prerenal secondary to dehydration Baseline creatinine appears to be 2.0 with GFR of 31 Presented with creatinine of 2.95 with GFR of 21. Creatinine is downtrending 2.11 from 2.66 Avoid nephrotoxic agents, dehydration and hypotension. Closely monitor urine output with strict I's and O's Repeat renal panel in the morning.   Parkinson's disease Continue home regimen, on Sinemet Continue fall precaution   Hyperlipidemia Continue home regimen on fenofibrate and simvastatin.   Chronic anxiety/depression/bipolar disorder Continue home regimen   Physical debility PT OT recommending SNF Continue fall precautions   Paroxysmal A. fib, not on oral anticoagulation due to frequent falls Monitor on telemetry   Anemia of chronic disease in the setting of CKD Hemoglobin is at baseline No overt bleeding      DVT prophylaxis: Subcu Lovenox daily   Code Status: Full code   Family Communication: None at bedside   Disposition Plan: Likely will return to [employer] called: Cardiology   Admission status: Inpatient status.     Status is: Patient requires at least 2 midnights for further evaluation and treatment of present condition.         Objective: Vitals:   08/15/21 0545 08/15/21 0555 08/15/21 0801 08/15/21 1440  BP: (!) 200/107 (!) 177/97 (!) 149/78 121/87  Pulse: (!) 58 (!) 51 61 (!) 58  Resp:   17 17  Temp: 98 F (36.7 C)  97.6 F (36.4 C) (!) 97.5 F (36.4 C)  TempSrc: Oral     SpO2: 99%  99% 99%  Weight:  Intake/Output Summary (Last 24 hours) at 08/15/2021 1531 Last data filed at 08/15/2021 1400 Gross per 24 hour  Intake 1540 ml  Output 2750 ml  Net -1210 ml   Filed Weights   08/15/21 0403  Weight: 90.1 kg    Exam:  General: 81 y.o. year-old male  well-developed well-nourished in NO apparent distress.  He is alert oriented x3.   Cardiovascular: Regular rate and rhythm no rubs or gallops. Respiratory: Clear to auscultation with no wheezes or rales. Abdomen: Soft nontender normal bowel sounds present.   Musculoskeletal: No lower extremity edema bilaterally. Skin: No ulcerative lesions noted.   Psychiatry: Mood is appropriate for condition and setting.   Data Reviewed: CBC: Recent Labs  Lab 08/11/21 1446 08/11/21 1800 08/12/21 0525 08/14/21 0550  WBC 7.8 5.9 4.7 5.3  NEUTROABS  --   --   --  3.3  HGB 10.5* 9.6* 10.2* 10.0*  HCT 34.2* 31.3* 33.8* 33.5*  MCV 91.7 89.7 90.9 90.1  PLT 196 170 166 545   Basic Metabolic Panel: Recent Labs  Lab 08/11/21 1446 08/11/21 1800 08/12/21 0525 08/14/21 0550 08/15/21 0604  NA 139  --  140 138 137  K 4.5  --  4.4 4.5 4.1  CL 108  --  107 106 104  CO2 22  --  25 25 26   GLUCOSE 97  --  96 85 86  BUN 52*  --  50* 42* 39*  CREATININE 2.95* 2.66* 2.11* 1.39* 1.29*  CALCIUM 10.1  --  10.1 10.0 10.0  MG  --   --  2.2  --  2.0  PHOS  --   --  3.3  --  3.1   GFR: Estimated Creatinine Clearance: 50.1 mL/min (A) (by C-G formula based on SCr of 1.29 mg/dL (H)). Liver Function Tests: Recent Labs  Lab 08/12/21 0525 08/14/21 0550  AST  --  11*  ALT  --  <5  ALKPHOS  --  77  BILITOT  --  0.4  PROT  --  6.5  ALBUMIN 3.6 3.3*   No results for input(s): LIPASE, AMYLASE in the last 168 hours. No results for input(s): AMMONIA in the last 168 hours. Coagulation Profile: No results for input(s): INR, PROTIME in the last 168 hours. Cardiac Enzymes: No results for input(s): CKTOTAL, CKMB, CKMBINDEX, TROPONINI in the last 168 hours. BNP (last 3 results) No results for input(s): PROBNP in the last 8760 hours. HbA1C: No results for input(s): HGBA1C in the last 72 hours. CBG: Recent Labs  Lab 08/11/21 1403 08/12/21 0644 08/13/21 0809 08/14/21 0643 08/15/21 0541  GLUCAP 116* 87 86  101* 84   Lipid Profile: No results for input(s): CHOL, HDL, LDLCALC, TRIG, CHOLHDL, LDLDIRECT in the last 72 hours. Thyroid Function Tests: No results for input(s): TSH, T4TOTAL, FREET4, T3FREE, THYROIDAB in the last 72 hours. Anemia Panel: No results for input(s): VITAMINB12, FOLATE, FERRITIN, TIBC, IRON, RETICCTPCT in the last 72 hours. Urine analysis:    Component Value Date/Time   COLORURINE YELLOW 08/11/2021 2130   APPEARANCEUR CLOUDY (A) 08/11/2021 2130   LABSPEC 1.015 08/11/2021 2130   PHURINE 5.0 08/11/2021 2130   GLUCOSEU 50 (A) 08/11/2021 2130   HGBUR NEGATIVE 08/11/2021 2130   BILIRUBINUR NEGATIVE 08/11/2021 2130   KETONESUR NEGATIVE 08/11/2021 2130   PROTEINUR 30 (A) 08/11/2021 2130   UROBILINOGEN 1.0 08/19/2014 1745   NITRITE POSITIVE (A) 08/11/2021 2130   LEUKOCYTESUR LARGE (A) 08/11/2021 2130   Sepsis Labs: @LABRCNTIP (procalcitonin:4,lacticidven:4)  ) Recent Results (from  the past 240 hour(s))  Resp Panel by RT-PCR (Flu A&B, Covid) Nasopharyngeal Swab     Status: Abnormal   Collection Time: 08/11/21  5:24 PM   Specimen: Nasopharyngeal Swab; Nasopharyngeal(NP) swabs in vial transport medium  Result Value Ref Range Status   SARS Coronavirus 2 by RT PCR POSITIVE (A) NEGATIVE Final    Comment: (NOTE) SARS-CoV-2 target nucleic acids are DETECTED.  The SARS-CoV-2 RNA is generally detectable in upper respiratory specimens during the acute phase of infection. Positive results are indicative of the presence of the identified virus, but do not rule out bacterial infection or co-infection with other pathogens not detected by the test. Clinical correlation with patient history and other diagnostic information is necessary to determine patient infection status. The expected result is Negative.  Fact Sheet for Patients: EntrepreneurPulse.com.au  Fact Sheet for Healthcare Providers: IncredibleEmployment.be  This test is not yet  approved or cleared by the [place] FDA and  has been authorized for detection and/or diagnosis of SARS-CoV-2 by FDA under an Emergency Use Authorization (EUA).  This EUA will remain in effect (meaning this test can be used) for the duration of  the COVID-19 declaration under Section 564(b)(1) of the A ct, 21 U.S.C. section 360bbb-3(b)(1), unless the authorization is terminated or revoked sooner.     Influenza A by PCR NEGATIVE NEGATIVE Final   Influenza B by PCR NEGATIVE NEGATIVE Final    Comment: (NOTE) The Xpert Xpress SARS-CoV-2/FLU/RSV plus assay is intended as an aid in the diagnosis of influenza from Nasopharyngeal swab specimens and should not be used as a sole basis for treatment. Nasal washings and aspirates are unacceptable for Xpert Xpress SARS-CoV-2/FLU/RSV testing.  Fact Sheet for Patients: EntrepreneurPulse.com.au  Fact Sheet for Healthcare Providers: IncredibleEmployment.be  This test is not yet approved or cleared by the [place] FDA and has been authorized for detection and/or diagnosis of SARS-CoV-2 by FDA under an Emergency Use Authorization (EUA). This EUA will remain in effect (meaning this test can be used) for the duration of the COVID-19 declaration under Section 564(b)(1) of the Act, 21 U.S.C. section 360bbb-3(b)(1), unless the authorization is terminated or revoked.  Performed at [facility], [place] [address] [place], [place] [address]       Studies: No results found.  Scheduled Meds:  carbidopa-levodopa  1 tablet Oral TID   enoxaparin (LOVENOX) injection  30 mg Subcutaneous Q24H   fenofibrate  54 mg Oral Daily   midodrine  10 mg Oral TID WC   molnupiravir EUA  4 capsule Oral BID   sertraline  100 mg Oral Daily   simvastatin  10 mg Oral q1800   sodium chloride flush  3 mL Intravenous Q12H    Continuous Infusions:  sodium chloride 75 mL/hr at 08/15/21 0042     LOS: 2 days      Kayleen Memos, MD [facility] Pager [phone]  If 7PM-7AM, please contact night-coverage [email or web address] Password [facility] 08/15/2021, 3:31 PM

## 2021-08-15 NOTE — Progress Notes (Signed)
Pt is injury-free, afebrile, alert, and oriented X 3. Pt's BP was 177/97 this morning and asymptomatic, the healthcare provider on coverage was notified. The rest of vital signs were within the baseline during this shift. Pt denies chest pain, SOB, nausea, vomiting, dizziness, signs or symptoms of bleeding, or acute changes during this shift. We will continue to monitor and work toward achieving the care plan goals

## 2021-08-16 LAB — GLUCOSE, CAPILLARY: Glucose-Capillary: 92 mg/dL (ref 70–99)

## 2021-08-16 MED ORDER — SODIUM CHLORIDE 0.9 % IV SOLN: INTRAVENOUS | Status: DC

## 2021-08-16 MED ORDER — ENOXAPARIN SODIUM 40 MG/0.4ML IJ SOSY
40.0000 mg | PREFILLED_SYRINGE | INTRAMUSCULAR | Status: DC
  Administered 2021-08-17 – 2021-08-25 (×9): 40 mg via SUBCUTANEOUS
  Filled 2021-08-16 (×9): qty 0.4

## 2021-08-16 NOTE — Progress Notes (Signed)
Patient BP 170/77, notified on call provider [place] to make aware, as per parameter orders. No new orders received at this time.

## 2021-08-16 NOTE — Progress Notes (Signed)
Occupational Therapy Treatment Patient Details Name: Fernando Carr MRN: [id number] DOB: 1940/10/12 Today's Date: 08/16/2021   History of present illness 81 y.o. male presents to [facility] ED on 12/21/2022with worsening confusion, refusing medications, poor PO intake, and recent fall. PMH includes bipolar disorder, MDD, HLD, AAA, afib, parkinson's disease.   OT comments  Patient was noted to have irregular HR on telebox during session with HR maintaining 50-58 bpm during activity. Patient was able to participate in UB bathing /dressing tasks in chair position in bed. Patient was noted to have maintain HR with increased activity of pulling self forwards off back of bed x3 to strengthen trunk. Nurse made aware of HR and BP during session. Patient would continue to benefit from skilled OT services at this time while admitted and after d/c to address noted deficits in order to improve overall safety and independence in ADLs.   Blood pressures: Supine 122/66 mmhg HR 56 bpm  Chair position  in bed 144/66 mmhg HR 58 bpm    Recommendations for follow up therapy are one component of a multi-disciplinary discharge planning process, led by the attending physician.  Recommendations may be updated based on patient status, additional functional criteria and insurance authorization.    Follow Up Recommendations  Skilled nursing-short term rehab (<3 hours/day)    Assistance Recommended at Discharge Frequent or constant Supervision/Assistance  Patient can return home with the following  A little help with walking and/or transfers;A lot of help with bathing/dressing/bathroom   Equipment Recommendations  None recommended by OT    Recommendations for Other Services      Precautions / Restrictions Precautions Precautions: Fall Precaution Comments: history of syncope (5th admission in last 6 months for this), monitor HR and BP Restrictions Weight Bearing Restrictions: No       Mobility Bed Mobility                     Transfers                         Balance                                           ADL either performed or assessed with clinical judgement   ADL Overall ADL's : Needs assistance/impaired     Grooming: Set up;Sitting Grooming Details (indicate cue type and reason): in chair position in bed. Upper Body Bathing: Set up;Sitting Upper Body Bathing Details (indicate cue type and reason): in chair position in bed.     Upper Body Dressing : Set up;Sitting Upper Body Dressing Details (indicate cue type and reason): in chair position in bed.                        Extremity/Trunk Assessment              Vision       Perception     Praxis      Cognition Arousal/Alertness: Awake/alert Behavior During Therapy: Flat affect Overall Cognitive Status: Impaired/Different from baseline                                            Exercises     Shoulder Instructions  General Comments      Pertinent Vitals/ Pain       Pain Assessment: No/denies pain  Home Living                                          Prior Functioning/Environment              Frequency  Min 2X/week        Progress Toward Goals  OT Goals(current goals can now be found in the care plan section)  Progress towards OT goals: Progressing toward goals     Plan Discharge plan remains appropriate    Co-evaluation                 AM-PAC OT "6 Clicks" Daily Activity     Outcome Measure   Help from another person eating meals?: A Little Help from another person taking care of personal grooming?: A Little Help from another person toileting, which includes using toliet, bedpan, or urinal?: A Lot Help from another person bathing (including washing, rinsing, drying)?: A Lot Help from another person to put on and taking off regular upper body clothing?: A Little Help from another person to put  on and taking off regular lower body clothing?: A Lot 6 Click Score: 15    End of Session    OT Visit Diagnosis: History of falling (Z91.81)   Activity Tolerance Other (comment) (telebox indicated irregular HR limiting therapy)   Patient Left in bed;with call bell/phone within reach;with bed alarm set   Nurse Communication Mobility status        Time: 0936-1000 OT Time Calculation (min): 24 min  Charges: OT General Charges $OT Visit: 1 Visit OT Treatments $Self Care/Home Management : 23-37 mins  Jackelyn Poling OTR/L, MS Acute Rehabilitation Department Office# [phone] Pager# [phone]   Marcellina Millin 08/16/2021, 12:29 PM

## 2021-08-16 NOTE — Plan of Care (Signed)

## 2021-08-16 NOTE — Progress Notes (Signed)
PROGRESS NOTE  Fernando Carr KGU:[id number] DOB: January 12, 1941 DOA: 08/11/2021 PCP: [name], MD  HPI/Recap of past 24 hours: Fernando Carr is a 81 y.o. male with medical history significant for Parkinson's disease, paroxysmal A. fib not on anticoagulation due to frequent falls, recurrent syncope, bipolar disorder type 1, CKD 3B who presented to [facility] ED after briefly losing consciousness.  Witnessed by nursing staff.  EMS was activated and also witnessed 3 syncopal episodes.  No postictal symptoms.  Patient endorses that he felt weak prior to passing out.  No chest pain or palpitations.  States he did not have much to drink or eat today.  He was in his usual state of health prior to this.  At the time of this interview, he is alert and oriented x 3.  In the ED work up revealed hypovolemia and AKI.  Received 500 cc IV fluid bolus normal saline.  EDP requested admission for recurrent syncope.  Admitted by hospitalist service.  UA positive for pyuria, started on Rocephin.  COVID-19 viral infection positive on 08/11/2021.    Orthostatic hypotension and autonomic dysfunction from Parkinson's disease.    08/16/2021: No new complaints.  Weak appearing.  On IV fluids.     Assessment/Plan: Principal Problem:   Syncope Active Problems:   Neurogenic orthostatic hypotension (HCC)  Recurrent syncope, suspect secondary to hypovolemia and significant orthostatic hypotension Hypovolemic on exam, dehydrated Prodrome, weakness prior to losing consciousness Positive orthostatic vital signs. PT recommending SNF. Continue home midodrine, abdominal binders, TED hose. Continue gentle IV fluid hydration and fall precautions Patient has followed with Dr. Percival Spanish outpatient Cardiology consulted  Autonomic dysfunction in the setting of Parkinson's disease with recurrent syncope Continue normal saline at 75 cc per hour Orthostatic vital signs daily Fall precautions Abdominal binder Midodrine TED  hose  COVID-19 viral infection Not hypoxic, with saturation 96% on room air. Start Molnupiravir Follow inflammatory markers   Improving AKI on CKD 3B, prerenal secondary to dehydration Baseline creatinine appears to be 2.0 with GFR of 31 Presented with creatinine of 2.95 with GFR of 21. Creatinine is downtrending 2.11 from 2.66 Avoid nephrotoxic agents, dehydration and hypotension. Closely monitor urine output with strict I's and O's Repeat renal panel in the morning.   Parkinson's disease Continue home regimen, on Sinemet Continue fall precaution   Hyperlipidemia Continue home regimen on fenofibrate and simvastatin.   Chronic anxiety/depression/bipolar disorder Continue home regimen   Physical debility PT OT recommending SNF Continue fall precautions   Paroxysmal A. fib, not on oral anticoagulation due to frequent falls Monitor on telemetry   Anemia of chronic disease in the setting of CKD Hemoglobin is at baseline No overt bleeding      DVT prophylaxis: Subcu Lovenox daily   Code Status: Full code   Family Communication: None at bedside   Disposition Plan: Likely will return to [employer] called: Cardiology   Admission status: Inpatient status.     Status is: Patient requires at least 2 midnights for further evaluation and treatment of present condition.         Objective: Vitals:   08/15/21 2026 08/16/21 0238 08/16/21 0339 08/16/21 1208  BP: (!) 144/64  (!) 170/77 (!) 171/76  Pulse: 69  63 (!) 58  Resp: 20  20 20   Temp: 98.8 F (37.1 C)  97.7 F (36.5 C)   TempSrc: Oral  Oral   SpO2: 96%  95% 97%  Weight: 91.5 kg 91.5 kg  Intake/Output Summary (Last 24 hours) at 08/16/2021 1449 Last data filed at 08/16/2021 0900 Gross per 24 hour  Intake 843 ml  Output 600 ml  Net 243 ml   Filed Weights   08/15/21 0403 08/15/21 2026 08/16/21 0238  Weight: 90.1 kg 91.5 kg 91.5 kg    Exam:  General: 81 y.o. year-old male  well-developed well-nourished in NO apparent distress.  He is alert oriented x3.   Cardiovascular: Regular rate and rhythm no rubs or gallops. Respiratory: Clear to auscultation with no wheezes or rales. Abdomen: Soft nontender normal bowel sounds present.   Musculoskeletal: No lower extremity edema bilaterally. Skin: No ulcerative lesions noted.   Psychiatry: Mood is appropriate for condition and setting.   Data Reviewed: CBC: Recent Labs  Lab 08/11/21 1446 08/11/21 1800 08/12/21 0525 08/14/21 0550  WBC 7.8 5.9 4.7 5.3  NEUTROABS  --   --   --  3.3  HGB 10.5* 9.6* 10.2* 10.0*  HCT 34.2* 31.3* 33.8* 33.5*  MCV 91.7 89.7 90.9 90.1  PLT 196 170 166 621   Basic Metabolic Panel: Recent Labs  Lab 08/11/21 1446 08/11/21 1800 08/12/21 0525 08/14/21 0550 08/15/21 0604  NA 139  --  140 138 137  K 4.5  --  4.4 4.5 4.1  CL 108  --  107 106 104  CO2 22  --  25 25 26   GLUCOSE 97  --  96 85 86  BUN 52*  --  50* 42* 39*  CREATININE 2.95* 2.66* 2.11* 1.39* 1.29*  CALCIUM 10.1  --  10.1 10.0 10.0  MG  --   --  2.2  --  2.0  PHOS  --   --  3.3  --  3.1   GFR: Estimated Creatinine Clearance: 50.1 mL/min (A) (by C-G formula based on SCr of 1.29 mg/dL (H)). Liver Function Tests: Recent Labs  Lab 08/12/21 0525 08/14/21 0550  AST  --  11*  ALT  --  <5  ALKPHOS  --  77  BILITOT  --  0.4  PROT  --  6.5  ALBUMIN 3.6 3.3*   No results for input(s): LIPASE, AMYLASE in the last 168 hours. No results for input(s): AMMONIA in the last 168 hours. Coagulation Profile: No results for input(s): INR, PROTIME in the last 168 hours. Cardiac Enzymes: No results for input(s): CKTOTAL, CKMB, CKMBINDEX, TROPONINI in the last 168 hours. BNP (last 3 results) No results for input(s): PROBNP in the last 8760 hours. HbA1C: No results for input(s): HGBA1C in the last 72 hours. CBG: Recent Labs  Lab 08/12/21 0644 08/13/21 0809 08/14/21 0643 08/15/21 0541 08/16/21 0341  GLUCAP 87 86 101* 84  92   Lipid Profile: No results for input(s): CHOL, HDL, LDLCALC, TRIG, CHOLHDL, LDLDIRECT in the last 72 hours. Thyroid Function Tests: No results for input(s): TSH, T4TOTAL, FREET4, T3FREE, THYROIDAB in the last 72 hours. Anemia Panel: No results for input(s): VITAMINB12, FOLATE, FERRITIN, TIBC, IRON, RETICCTPCT in the last 72 hours. Urine analysis:    Component Value Date/Time   COLORURINE YELLOW 08/11/2021 2130   APPEARANCEUR CLOUDY (A) 08/11/2021 2130   LABSPEC 1.015 08/11/2021 2130   PHURINE 5.0 08/11/2021 2130   GLUCOSEU 50 (A) 08/11/2021 2130   HGBUR NEGATIVE 08/11/2021 2130   BILIRUBINUR NEGATIVE 08/11/2021 2130   KETONESUR NEGATIVE 08/11/2021 2130   PROTEINUR 30 (A) 08/11/2021 2130   UROBILINOGEN 1.0 08/19/2014 1745   NITRITE POSITIVE (A) 08/11/2021 2130   LEUKOCYTESUR LARGE (A) 08/11/2021 2130  Sepsis Labs: @LABRCNTIP (procalcitonin:4,lacticidven:4)  ) Recent Results (from the past 240 hour(s))  Resp Panel by RT-PCR (Flu A&B, Covid) Nasopharyngeal Swab     Status: Abnormal   Collection Time: 08/11/21  5:24 PM   Specimen: Nasopharyngeal Swab; Nasopharyngeal(NP) swabs in vial transport medium  Result Value Ref Range Status   SARS Coronavirus 2 by RT PCR POSITIVE (A) NEGATIVE Final    Comment: (NOTE) SARS-CoV-2 target nucleic acids are DETECTED.  The SARS-CoV-2 RNA is generally detectable in upper respiratory specimens during the acute phase of infection. Positive results are indicative of the presence of the identified virus, but do not rule out bacterial infection or co-infection with other pathogens not detected by the test. Clinical correlation with patient history and other diagnostic information is necessary to determine patient infection status. The expected result is Negative.  Fact Sheet for Patients: EntrepreneurPulse.com.au  Fact Sheet for Healthcare Providers: IncredibleEmployment.be  This test is not yet  approved or cleared by the [place] FDA and  has been authorized for detection and/or diagnosis of SARS-CoV-2 by FDA under an Emergency Use Authorization (EUA).  This EUA will remain in effect (meaning this test can be used) for the duration of  the COVID-19 declaration under Section 564(b)(1) of the A ct, 21 U.S.C. section 360bbb-3(b)(1), unless the authorization is terminated or revoked sooner.     Influenza A by PCR NEGATIVE NEGATIVE Final   Influenza B by PCR NEGATIVE NEGATIVE Final    Comment: (NOTE) The Xpert Xpress SARS-CoV-2/FLU/RSV plus assay is intended as an aid in the diagnosis of influenza from Nasopharyngeal swab specimens and should not be used as a sole basis for treatment. Nasal washings and aspirates are unacceptable for Xpert Xpress SARS-CoV-2/FLU/RSV testing.  Fact Sheet for Patients: EntrepreneurPulse.com.au  Fact Sheet for Healthcare Providers: IncredibleEmployment.be  This test is not yet approved or cleared by the [place] FDA and has been authorized for detection and/or diagnosis of SARS-CoV-2 by FDA under an Emergency Use Authorization (EUA). This EUA will remain in effect (meaning this test can be used) for the duration of the COVID-19 declaration under Section 564(b)(1) of the Act, 21 U.S.C. section 360bbb-3(b)(1), unless the authorization is terminated or revoked.  Performed at [facility], [place] [address], [place], [place] [address]       Studies: No results found.  Scheduled Meds:  carbidopa-levodopa  1 tablet Oral TID   [START ON 08/17/2021] enoxaparin (LOVENOX) injection  40 mg Subcutaneous Q24H   fenofibrate  54 mg Oral Daily   midodrine  10 mg Oral TID WC   molnupiravir EUA  4 capsule Oral BID   sertraline  100 mg Oral Daily   simvastatin  10 mg Oral q1800   sodium chloride flush  3 mL Intravenous Q12H    Continuous Infusions:  sodium chloride 50 mL/hr at 08/16/21  1045     LOS: 3 days     Kayleen Memos, MD [facility] Pager [phone]  If 7PM-7AM, please contact night-coverage [email or web address] Password [facility] 08/16/2021, 2:49 PM

## 2021-08-17 LAB — GLUCOSE, CAPILLARY: Glucose-Capillary: 78 mg/dL (ref 70–99)

## 2021-08-17 MED ORDER — IPRATROPIUM-ALBUTEROL 20-100 MCG/ACT IN AERS
1.0000 | INHALATION_SPRAY | Freq: Four times a day (QID) | RESPIRATORY_TRACT | Status: DC
  Administered 2021-08-17 – 2021-08-19 (×3): 1 via RESPIRATORY_TRACT
  Filled 2021-08-17: qty 4

## 2021-08-17 MED ORDER — SODIUM CHLORIDE 0.9 % IV SOLN: INTRAVENOUS | Status: AC

## 2021-08-17 NOTE — Plan of Care (Signed)
Pt Aox4, pleasant and cooperative with staff.  +COVID, on RA. Medications/Inhalers per orders.  Pt noncompliant with TED hose/ ABD Binder for orthostasis. Pt educated thoroughly.  PT/OT following, recommending SNF TOC working on placement.   Problem: Clinical Measurements: Goal: Ability to maintain clinical measurements within normal limits will improve Outcome: Progressing   Problem: Activity: Goal: Risk for activity intolerance will decrease Outcome: Progressing   Problem: Nutrition: Goal: Adequate nutrition will be maintained Outcome: Progressing   Problem: Coping: Goal: Level of anxiety will decrease Outcome: Progressing   Problem: Pain Managment: Goal: General experience of comfort will improve Outcome: Progressing   Problem: Safety: Goal: Ability to remain free from injury will improve Outcome: Progressing   Problem: Skin Integrity: Goal: Risk for impaired skin integrity will decrease Outcome: Progressing

## 2021-08-17 NOTE — Care Management Important Message (Signed)
Important Message  Patient Details IM Letter placed in Patients room. Name: Fernando Carr MRN: [id number] Date of Birth: [birth year]   Medicare Important Message Given:  Yes     Kerin Salen 08/17/2021, 10:38 AM

## 2021-08-17 NOTE — Progress Notes (Signed)
PROGRESS NOTE  Fernando Carr AJG:[id number] DOB: [birth year] DOA: 08/11/2021 PCP: [name], MD  HPI/Recap of past 24 hours: Fernando Carr is a 81 y.o. male with medical history significant for Parkinson's disease, paroxysmal A. fib not on anticoagulation due to frequent falls, recurrent syncope, bipolar disorder type 1, CKD 3B who presented to [facility] ED after briefly losing consciousness at ALF.  Witnessed by nursing staff.  EMS was activated, they witnessed 3 syncopal episodes.  No postictal symptoms.  Patient endorses that he felt very weak prior to passing out.  No chest pain or palpitations.  States he did not have much to drink or eat prior to this event.  He was in his usual state of health prior.  At the time of this interview, he is alert and oriented x 3.  Work up revealed hypovolemia, AKI, and orthostatic hypotension.  Received IV fluids.  He was seen by cardiology.  His syncope thought secondary to orthostatic hypotension likely from autonomic dysfunction in the setting of Parkinson disease.    COVID-19 viral infection positive on 08/11/2021, received 5 days of Molnupiravir to end on 08/17/2021.  Not hypoxic therefore steroids were not started.  Seen by PT OT with recommendation for SNF.  TOC assisting with SNF placement.  Isolation window 10 days to end of 08/21/2021.     08/17/2021: Patient was seen and examined at his bedside.  There were no acute events overnight.  He has no complaints.    Assessment/Plan: Principal Problem:   Syncope Active Problems:   Neurogenic orthostatic hypotension (HCC)  Recurrent syncope, secondary to orthostatic hypotension from autonomic dysfunction in the setting of Parkinson's disease Hypovolemic on exam, dehydrated Prodrome, felt very weak prior to losing consciousness Positive orthostatic vital signs. PT recommending SNF. Continue home midodrine, abdominal binders, TED hose. Continue gentle IV fluid hydration with normal saline Continue fall  precautions Patient has followed with Dr. Percival Carr outpatient Seen by cardiology felt syncope is due to orthostatic hypotension secondary to autonomic dysfunction.  Autonomic dysfunction in the setting of Parkinson's disease with recurrent syncope Continue normal saline at 30 cc per hour x 2 days Orthostatic vital signs daily Fall precautions Abdominal binder Midodrine TED hose  COVID-19 viral infection Not hypoxic, with saturation 96-100% on room air. Complete 5 days of antiviral Molnupiravir Follow inflammatory markers   Improving AKI on CKD 3B, prerenal secondary to dehydration Appears to be back to his baseline creatinine 1.2 with GFR 56. Presented with creatinine of 2.95 with GFR of 21. Avoid nephrotoxic agents, dehydration and hypotension. Closely monitor urine output with strict I's and O's Repeat renal panel in the morning.   Parkinson's disease Continue home regimen, on Sinemet Continue fall precaution   Hyperlipidemia Continue home regimen on fenofibrate and simvastatin.   Chronic anxiety/depression/bipolar disorder Continue home regimen   Physical debility PT OT recommending SNF Continue fall precautions TOC assisting with SNF placement. Likely requires 10 days of isolation for SNF placement, ends 08/21/2021.   Paroxysmal A. fib, not on oral anticoagulation due to frequent falls Monitor on telemetry Rate is controlled   Anemia of chronic disease in the setting of CKD Hemoglobin is at baseline 10.0. No overt bleeding      DVT prophylaxis: Subcu Lovenox daily   Code Status: Full code   Family Communication: None at bedside   Disposition Plan: Likely DC to SNF when bed is available.  Patient is medically cleared for discharge.   Consults called: Cardiology   Admission status: Inpatient  status.     Status is: Patient requires at least 2 midnights for further evaluation and treatment of present condition.         Objective: Vitals:    08/17/21 0232 08/17/21 0331 08/17/21 0838 08/17/21 1157  BP:  (!) 172/89 (!) 120/54 134/74  Pulse:  60 (!) 56 (!) 59  Resp:    20  Temp:  99.5 F (37.5 C)    TempSrc:  Axillary    SpO2:  100%  97%  Weight: 90.8 kg       Intake/Output Summary (Last 24 hours) at 08/17/2021 1320 Last data filed at 08/17/2021 0845 Gross per 24 hour  Intake 1521.53 ml  Output 2800 ml  Net -1278.47 ml   Filed Weights   08/16/21 0238 08/16/21 2011 08/17/21 0232  Weight: 91.5 kg 90.8 kg 90.8 kg    Exam:  General: 81 y.o. year-old male well-developed well-nourished in no acute distress.  He is alert and oriented x3.   Cardiovascular: Regular rate and rhythm no rubs or gallops.   Respiratory: Clear to auscultation no wheezes or rales.   Abdomen: Soft nontender bowel sounds present.   Musculoskeletal: No lower extremity edema bilaterally. Skin: No ulcerative lesions noted. Psychiatry: Mood is appropriate for condition and setting..   Data Reviewed: CBC: Recent Labs  Lab 08/11/21 1446 08/11/21 1800 08/12/21 0525 08/14/21 0550  WBC 7.8 5.9 4.7 5.3  NEUTROABS  --   --   --  3.3  HGB 10.5* 9.6* 10.2* 10.0*  HCT 34.2* 31.3* 33.8* 33.5*  MCV 91.7 89.7 90.9 90.1  PLT 196 170 166 277   Basic Metabolic Panel: Recent Labs  Lab 08/11/21 1446 08/11/21 1800 08/12/21 0525 08/14/21 0550 08/15/21 0604  NA 139  --  140 138 137  K 4.5  --  4.4 4.5 4.1  CL 108  --  107 106 104  CO2 22  --  25 25 26   GLUCOSE 97  --  96 85 86  BUN 52*  --  50* 42* 39*  CREATININE 2.95* 2.66* 2.11* 1.39* 1.29*  CALCIUM 10.1  --  10.1 10.0 10.0  MG  --   --  2.2  --  2.0  PHOS  --   --  3.3  --  3.1   GFR: Estimated Creatinine Clearance: 50.1 mL/min (A) (by C-G formula based on SCr of 1.29 mg/dL (H)). Liver Function Tests: Recent Labs  Lab 08/12/21 0525 08/14/21 0550  AST  --  11*  ALT  --  <5  ALKPHOS  --  77  BILITOT  --  0.4  PROT  --  6.5  ALBUMIN 3.6 3.3*   No results for input(s): LIPASE, AMYLASE  in the last 168 hours. No results for input(s): AMMONIA in the last 168 hours. Coagulation Profile: No results for input(s): INR, PROTIME in the last 168 hours. Cardiac Enzymes: No results for input(s): CKTOTAL, CKMB, CKMBINDEX, TROPONINI in the last 168 hours. BNP (last 3 results) No results for input(s): PROBNP in the last 8760 hours. HbA1C: No results for input(s): HGBA1C in the last 72 hours. CBG: Recent Labs  Lab 08/13/21 0809 08/14/21 0643 08/15/21 0541 08/16/21 0341 08/17/21 0334  GLUCAP 86 101* 84 92 78   Lipid Profile: No results for input(s): CHOL, HDL, LDLCALC, TRIG, CHOLHDL, LDLDIRECT in the last 72 hours. Thyroid Function Tests: No results for input(s): TSH, T4TOTAL, FREET4, T3FREE, THYROIDAB in the last 72 hours. Anemia Panel: No results for input(s): VITAMINB12, FOLATE, FERRITIN,  TIBC, IRON, RETICCTPCT in the last 72 hours. Urine analysis:    Component Value Date/Time   COLORURINE YELLOW 08/11/2021 2130   APPEARANCEUR CLOUDY (A) 08/11/2021 2130   LABSPEC 1.015 08/11/2021 2130   PHURINE 5.0 08/11/2021 2130   GLUCOSEU 50 (A) 08/11/2021 2130   HGBUR NEGATIVE 08/11/2021 2130   BILIRUBINUR NEGATIVE 08/11/2021 2130    NEGATIVE 08/11/2021 2130   PROTEINUR 30 (A) 08/11/2021 2130   UROBILINOGEN 1.0 08/19/2014 1745   NITRITE POSITIVE (A) 08/11/2021 2130   LEUKOCYTESUR LARGE (A) 08/11/2021 2130   Sepsis Labs: @LABRCNTIP (procalcitonin:4,lacticidven:4)  ) Recent Results (from the past 240 hour(s))  Resp Panel by RT-PCR (Flu A&B, Covid) Nasopharyngeal Swab     Status: Abnormal   Collection Time: 08/11/21  5:24 PM   Specimen: Nasopharyngeal Swab; Nasopharyngeal(NP) swabs in vial transport medium  Result Value Ref Range Status   SARS Coronavirus 2 by RT PCR POSITIVE (A) NEGATIVE Final    Comment: (NOTE) SARS-CoV-2 target nucleic acids are DETECTED.  The SARS-CoV-2 RNA is generally detectable in upper respiratory specimens during the acute phase of  infection. Positive results are indicative of the presence of the identified virus, but do not rule out bacterial infection or co-infection with other pathogens not detected by the test. Clinical correlation with patient history and other diagnostic information is necessary to determine patient infection status. The expected result is Negative.  Fact Sheet for Patients: EntrepreneurPulse.com.au  Fact Sheet for Healthcare Providers: IncredibleEmployment.be  This test is not yet approved or cleared by the [place] FDA and  has been authorized for detection and/or diagnosis of SARS-CoV-2 by FDA under an Emergency Use Authorization (EUA).  This EUA will remain in effect (meaning this test can be used) for the duration of  the COVID-19 declaration under Section 564(b)(1) of the A ct, 21 U.S.C. section 360bbb-3(b)(1), unless the authorization is terminated or revoked sooner.     Influenza A by PCR NEGATIVE NEGATIVE Final   Influenza B by PCR NEGATIVE NEGATIVE Final    Comment: (NOTE) The Xpert Xpress SARS-CoV-2/FLU/RSV plus assay is intended as an aid in the diagnosis of influenza from Nasopharyngeal swab specimens and should not be used as a sole basis for treatment. Nasal washings and aspirates are unacceptable for Xpert Xpress SARS-CoV-2/FLU/RSV testing.  Fact Sheet for Patients: EntrepreneurPulse.com.au  Fact Sheet for Healthcare Providers: IncredibleEmployment.be  This test is not yet approved or cleared by the [place] FDA and has been authorized for detection and/or diagnosis of SARS-CoV-2 by FDA under an Emergency Use Authorization (EUA). This EUA will remain in effect (meaning this test can be used) for the duration of the COVID-19 declaration under Section 564(b)(1) of the Act, 21 U.S.C. section 360bbb-3(b)(1), unless the authorization is terminated or revoked.  Performed at [facility], [place] [address], [place], [place] [address]       Studies: No results found.  Scheduled Meds:  carbidopa-levodopa  1 tablet Oral TID   enoxaparin (LOVENOX) injection  40 mg Subcutaneous Q24H   fenofibrate  54 mg Oral Daily   Ipratropium-Albuterol  1 puff Inhalation Q6H   midodrine  10 mg Oral TID WC   molnupiravir EUA  4 capsule Oral BID   sertraline  100 mg Oral Daily   simvastatin  10 mg Oral q1800   sodium chloride flush  3 mL Intravenous Q12H    Continuous Infusions:  sodium chloride 30 mL/hr at 08/17/21 1204     LOS: 4 days  Kayleen Memos, MD [facility] Pager [phone]  If 7PM-7AM, please contact night-coverage [email or web address] Password [email or web address] 08/17/2021, 1:20 PM

## 2021-08-18 DIAGNOSIS — I951 Orthostatic hypotension: Secondary | ICD-10-CM

## 2021-08-18 DIAGNOSIS — R54 Age-related physical debility: Secondary | ICD-10-CM

## 2021-08-18 DIAGNOSIS — U071 COVID-19: Secondary | ICD-10-CM

## 2021-08-18 DIAGNOSIS — E860 Dehydration: Secondary | ICD-10-CM

## 2021-08-18 DIAGNOSIS — F32A Depression, unspecified: Secondary | ICD-10-CM

## 2021-08-18 DIAGNOSIS — R5381 Other malaise: Secondary | ICD-10-CM

## 2021-08-18 DIAGNOSIS — N179 Acute kidney failure, unspecified: Secondary | ICD-10-CM

## 2021-08-18 DIAGNOSIS — F319 Bipolar disorder, unspecified: Secondary | ICD-10-CM

## 2021-08-18 DIAGNOSIS — G20 Parkinson's disease: Secondary | ICD-10-CM

## 2021-08-18 DIAGNOSIS — E785 Hyperlipidemia, unspecified: Secondary | ICD-10-CM

## 2021-08-18 DIAGNOSIS — N1832 Chronic kidney disease, stage 3b: Secondary | ICD-10-CM

## 2021-08-18 DIAGNOSIS — G909 Disorder of the autonomic nervous system, unspecified: Secondary | ICD-10-CM

## 2021-08-18 DIAGNOSIS — D631 Anemia in chronic kidney disease: Secondary | ICD-10-CM

## 2021-08-18 DIAGNOSIS — F419 Anxiety disorder, unspecified: Secondary | ICD-10-CM

## 2021-08-18 DIAGNOSIS — F02818 Dementia in other diseases classified elsewhere, unspecified severity, with other behavioral disturbance: Secondary | ICD-10-CM

## 2021-08-18 DIAGNOSIS — G908 Other disorders of autonomic nervous system: Secondary | ICD-10-CM

## 2021-08-18 LAB — CBC
HCT: 32.4 % — ABNORMAL LOW (ref 39.0–52.0)
Hemoglobin: 9.9 g/dL — ABNORMAL LOW (ref 13.0–17.0)
MCH: 27.6 pg (ref 26.0–34.0)
MCHC: 30.6 g/dL (ref 30.0–36.0)
MCV: 90.3 fL (ref 80.0–100.0)
Platelets: 183 10*3/uL (ref 150–400)
RBC: 3.59 MIL/uL — ABNORMAL LOW (ref 4.22–5.81)
RDW: 12.8 % (ref 11.5–15.5)
WBC: 6.1 10*3/uL (ref 4.0–10.5)
nRBC: 0 % (ref 0.0–0.2)

## 2021-08-18 LAB — PHOSPHORUS: Phosphorus: 3.5 mg/dL (ref 2.5–4.6)

## 2021-08-18 LAB — BASIC METABOLIC PANEL
Anion gap: 5 (ref 5–15)
BUN: 42 mg/dL — ABNORMAL HIGH (ref 8–23)
CO2: 25 mmol/L (ref 22–32)
Calcium: 10 mg/dL (ref 8.9–10.3)
Chloride: 105 mmol/L (ref 98–111)
Creatinine, Ser: 1.53 mg/dL — ABNORMAL HIGH (ref 0.61–1.24)
GFR, Estimated: 46 mL/min — ABNORMAL LOW (ref >60)
Glucose, Bld: 88 mg/dL (ref 70–99)
Potassium: 4.6 mmol/L (ref 3.5–5.1)
Sodium: 135 mmol/L (ref 135–145)

## 2021-08-18 LAB — MAGNESIUM: Magnesium: 2.2 mg/dL (ref 1.7–2.4)

## 2021-08-18 LAB — GLUCOSE, CAPILLARY: Glucose-Capillary: 127 mg/dL — ABNORMAL HIGH (ref 70–99)

## 2021-08-18 MED ORDER — RISPERIDONE 0.25 MG PO TABS
0.5000 mg | ORAL_TABLET | Freq: Every day | ORAL | Status: DC
  Administered 2021-08-18 – 2021-08-24 (×7): 0.5 mg via ORAL
  Filled 2021-08-18 (×7): qty 2

## 2021-08-18 NOTE — Progress Notes (Signed)
Physical Therapy Treatment Patient Details Name: Fernando Carr MRN: [id number] DOB: April 12, 1941 Today's Date: 08/18/2021   History of Present Illness 81 y.o. male [facility] ED on 12/21/2022with worsening confusion, refusing medications, poor PO intake, and recent fall. Pt with orthostatic hypotension/syncope from autonomic dysfunction associated with Parkinsons and COVID. PMH includes bipolar disorder, MDD, HLD, AAA, afib, parkinson's disease.    PT Comments    Pt agreeable to PT and making some progress today.  He did not have syncopal symptoms, drop in BP post ambulation but asymptomatic.  Advanced to 5' ambulation with RW, min A, mod cues and fatigued easily. Participated in multiple transfers with min A and cues.  Declined further exercise.Continue to progress as able, do continue to recommend SNF due to decline in mobility, frequent readmission and falls.  BPs as follows:  supine 158/97,  sitting 145/79,  standing 148/128 but question reading due to pt holding on RW and with tremors,  post walk sitting 122/59 - dropped but asymptomatic.   HR stable, unable to get O2 reading    Recommendations for follow up therapy are one component of a multi-disciplinary discharge planning process, led by the attending physician.  Recommendations may be updated based on patient status, additional functional criteria and insurance authorization.  Follow Up Recommendations  Skilled nursing-short term rehab (<3 hours/day)     Assistance Recommended at Discharge Intermittent Supervision/Assistance  Patient can return home with the following A little help with walking and/or transfers;A little help with bathing/dressing/bathroom;Assistance with Education officer, environmental cushion (measurements PT);Wheelchair (measurements PT)    Recommendations for Other Services       Precautions / Restrictions Precautions Precautions: Fall Precaution Comments: history of  syncope (5th admission in last 6 months for this), monitor HR and BP Restrictions Weight Bearing Restrictions: No     Mobility  Bed Mobility Overal bed mobility: Needs Assistance Bed Mobility: Supine to Sit     Supine to sit: Min assist     General bed mobility comments: Increased time, pt using momentum of legs to lift trunk with min A    Transfers Overall transfer level: Needs assistance Equipment used: Rolling walker (2 wheels) Transfers: Sit to/from Stand Sit to Stand: Min assist           General transfer comment: Performed x 3 with cues for hand placement and light min A to rise and steady    Ambulation/Gait Ambulation/Gait assistance: Min Web designer (Feet): 40 Feet Assistive device: Rolling walker (2 wheels) Gait Pattern/deviations: Step-to pattern, Decreased stride length, Shuffle, Trunk flexed Gait velocity: Decreased     General Gait Details: cues for posture and RW proximity; min A to steady   Chief Strategy Officer    Modified Rankin (Stroke Patients Only)       Balance Overall balance assessment: Needs assistance Sitting-balance support: No upper extremity supported, Feet supported Sitting balance-Leahy Scale: Fair     Standing balance support: Single extremity supported, Reliant on assistive device for balance Standing balance-Leahy Scale: Poor Standing balance comment: Reliant on BUE support to ambulate and single support in standing                            Cognition Arousal/Alertness: Awake/alert Behavior During Therapy: Flat affect Overall Cognitive Status: Within Functional Limits for tasks assessed  General Comments: Flat affect, little verbalization but followed commands and WFL for task assessed        Exercises      General Comments General comments (skin integrity, edema, etc.):  BPs as follows:  supine 158/97,  sitting 145/79,   standing 148/128 but question reading due to pt holding on RW and with tremors,  post walk sitting 122/59 - dropped but asymptomatic.   HR stable, unable to get O2 reading      Pertinent Vitals/Pain Pain Assessment Pain Assessment: No/denies pain    Home Living                          Prior Function            PT Goals (current goals can now be found in the care plan section) Progress towards PT goals: Progressing toward goals    Frequency    Min 2X/week      PT Plan Current plan remains appropriate    Co-evaluation              AM-PAC PT "6 Clicks" Mobility   Outcome Measure  Help needed turning from your back to your side while in a flat bed without using bedrails?: A Little Help needed moving from lying on your back to sitting on the side of a flat bed without using bedrails?: A Little Help needed moving to and from a bed to a chair (including a wheelchair)?: A Little Help needed standing up from a chair using your arms (e.g., wheelchair or bedside chair)?: A Little Help needed to walk in hospital room?: A Lot (cues) Help needed climbing 3-5 steps with a railing? : Total 6 Click Score: 15    End of Session Equipment Utilized During Treatment: Gait belt Activity Tolerance: Patient tolerated treatment well Patient left: with chair alarm set;in chair;with call bell/phone within reach Nurse Communication: Mobility status PT Visit Diagnosis: Other abnormalities of gait and mobility (R26.89);Other symptoms and signs involving the nervous system (R29.898)     Time: 6384-6659 PT Time Calculation (min) (ACUTE ONLY): 25 min  Charges:  $Gait Training: 8-22 mins $Therapeutic Activity: 8-22 mins                     Abran Richard, PT Acute Rehab Services Pager [phone] [facility] 08/18/2021, 4:57 PM

## 2021-08-18 NOTE — Progress Notes (Signed)
PROGRESS NOTE    Calbert Hulsebus Mullaly  XBJ:[id number] DOB: 20-Feb-1941 DOA: 08/11/2021 PCP: [name], MD    Chief Complaint  Patient presents with   Syncope    Syncope     Brief Narrative:  Shann Merrick Sirmons is a 81 y.o. male with medical history significant for Parkinson's disease, paroxysmal A. fib not on anticoagulation due to frequent falls, recurrent syncope, bipolar disorder type 1, CKD 3B who presented to [facility] ED after briefly losing consciousness at ALF.  Witnessed by nursing staff.  EMS was activated, they witnessed 3 syncopal episodes.  No postictal symptoms.  Patient endorses that he felt very weak prior to passing out.  No chest pain or palpitations.  States he did not have much to drink or eat prior to this event.  He was in his usual state of health prior.  At the time of this interview, he is alert and oriented x 3.  Work up revealed hypovolemia, AKI, and orthostatic hypotension.  Received IV fluids.  He was seen by cardiology.  His syncope thought secondary to orthostatic hypotension likely from autonomic dysfunction in the setting of Parkinson disease.    COVID-19 viral infection positive on 08/11/2021, received 5 days of Molnupiravir to end on 08/17/2021.  Not hypoxic therefore steroids were not started.   Seen by PT OT with recommendation for SNF.  TOC assisting with SNF placement.  Isolation window 10 days to end of 08/21/2021.   Assessment & Plan:   Principal Problem:   Syncope Active Problems:   Depression   Bipolar disorder (HCC)   AF (paroxysmal atrial fibrillation) (HCC)   Neurogenic orthostatic hypotension (HCC)   Dementia due to Parkinson's disease with behavioral disturbance (HCC)   COVID-19   Debility   Anxiety   Autonomic dysfunction   Orthostatic hypotension   #1 recurrent syncope, can due to orthostatic hypotension from autonomic dysfunction in the setting of Parkinson's disease -Patient noted on presentation to be hypokalemic, dehydrated noted to have a  prodrome of weakness prior to losing consciousness. -Orthostatic vitals checked were positive on admission. -Patient hydrated with IV fluids. -Continue home regimen midodrine, TED hose, abdominal binders. -Fall precautions. -Patient seen in consultation by cardiology felt syncopal episode secondary to orthostatic hypotension secondary to autonomic dysfunction. -Patient seen by PT who are recommending SNF.  2.  Autonomic dysfunction in setting of Parkinson's disease with recurrent syncope -Patient was hydrated IV fluids. -Fall precautions. -Continue midodrine, abdominal binder, TED hose.  3.  COVID-19 viral infection -Patient noted not to be hypoxic with sats of 96 200% on room air. -Status post 5-day course of antiviral molnupiravir. -Inflammatory markers have trended down.  4.  AKI on CKD stage IIIb, prerenal secondary to dehydration -Renal function improved with hydration creatinine noted on admission 2.95, trended down currently at 1.53. -Avoid nephrotoxic agents. -Supportive care.  5.  Parkinson's disease -Continue Sinemet. -Fall precautions. -Outpatient follow-up.  6.  Hyperlipidemia -Continue simvastatin, fenofibrate.  7.  Depression/chronic anxiety/bipolar disorder -Stable. -Continue Zoloft. -Resume home regimen risperidone.  8.  Paroxysmal atrial fibrillation -Currently rate controlled. -Not a anticoagulation candidate due to history of frequent falls.  9.  Anemia of chronic disease in the setting of CKD -Stable.  10.  Debility -Seen by PT OT who are recommending SNF placement. -Patient likely requires 10 days of isolation for SNF placement which ends on 08/21/2021.  8.   DVT prophylaxis: Lovenox Code Status: Full Family Communication: Updated patient.  No family at bedside. Disposition:   Status  is: Inpatient  Remains inpatient appropriate because: Unsafe disposition.     Consultants:  Cardiology: Dr.Nishan 13 2022  Procedures:  CT head CT  C-spine 08/11/2021    Antimicrobials:  IV Rocephin 08/12/2021>>>> 08/14/2021 Molnupiravir 08/13/2021>>>> 08/18/2021    Subjective: Sitting up in bed.  Denies any chest pain.  No shortness of breath.  On room air with sats of 94%  Objective: Vitals:   08/18/21 0310 08/18/21 0500 08/18/21 1339 08/18/21 2032  BP: 137/77  (!) 137/92 135/68  Pulse: 62  (!) 55 (!) 51  Resp: 20  20 16   Temp: 99.3 F (37.4 C)  98.1 F (36.7 C) 97.9 F (36.6 C)  TempSrc: Axillary  Oral   SpO2: 94%  97% 100%  Weight:  90.6 kg      Intake/Output Summary (Last 24 hours) at 08/18/2021 2141 Last data filed at 08/18/2021 1858 Gross per 24 hour  Intake 700 ml  Output 1000 ml  Net -300 ml   Filed Weights   08/16/21 2011 08/17/21 0232 08/18/21 0500  Weight: 90.8 kg 90.8 kg 90.6 kg    Examination:  General exam: Appears calm and comfortable. Pill rolling tremors  Respiratory system: Clear to auscultation.  No wheezes, no crackles, no rhonchi respiratory effort normal. Cardiovascular system: S1 & S2 heard, RRR. No JVD, murmurs, rubs, gallops or clicks. No pedal edema. Gastrointestinal system: Abdomen is nondistended, soft and nontender. No organomegaly or masses felt. Normal bowel sounds heard. Central nervous system: Alert and oriented. No focal neurological deficits. Extremities: Symmetric 5 x 5 power. Skin: No rashes, lesions or ulcers Psychiatry: Judgement and insight appear normal. Mood & affect appropriate.     Data Reviewed: I have personally reviewed following labs and imaging studies  CBC: Recent Labs  Lab 08/12/21 0525 08/14/21 0550 08/18/21 0447  WBC 4.7 5.3 6.1  NEUTROABS  --  3.3  --   HGB 10.2* 10.0* 9.9*  HCT 33.8* 33.5* 32.4*  MCV 90.9 90.1 90.3  PLT 166 178 094    Basic Metabolic Panel: Recent Labs  Lab 08/12/21 0525 08/14/21 0550 08/15/21 0604 08/18/21 0447  NA 140 138 137 135  K 4.4 4.5 4.1 4.6  CL 107 106 104 105  CO2 25 25 26 25   GLUCOSE 96 85 86 88  BUN 50*  42* 39* 42*  CREATININE 2.11* 1.39* 1.29* 1.53*  CALCIUM 10.1 10.0 10.0 10.0  MG 2.2  --  2.0 2.2  PHOS 3.3  --  3.1 3.5    GFR: Estimated Creatinine Clearance: 42.3 mL/min (A) (by C-G formula based on SCr of 1.53 mg/dL (H)).  Liver Function Tests: Recent Labs  Lab 08/12/21 0525 08/14/21 0550  AST  --  11*  ALT  --  <5  ALKPHOS  --  77  BILITOT  --  0.4  PROT  --  6.5  ALBUMIN 3.6 3.3*    CBG: Recent Labs  Lab 08/14/21 0643 08/15/21 0541 08/16/21 0341 08/17/21 0334 08/18/21 0005  GLUCAP 101* 84 92 78 127*     Recent Results (from the past 240 hour(s))  Resp Panel by RT-PCR (Flu A&B, Covid) Nasopharyngeal Swab     Status: Abnormal   Collection Time: 08/11/21  5:24 PM   Specimen: Nasopharyngeal Swab; Nasopharyngeal(NP) swabs in vial transport medium  Result Value Ref Range Status   SARS Coronavirus 2 by RT PCR POSITIVE (A) NEGATIVE Final    Comment: (NOTE) SARS-CoV-2 target nucleic acids are DETECTED.  The SARS-CoV-2 RNA is generally detectable  in upper respiratory specimens during the acute phase of infection. Positive results are indicative of the presence of the identified virus, but do not rule out bacterial infection or co-infection with other pathogens not detected by the test. Clinical correlation with patient history and other diagnostic information is necessary to determine patient infection status. The expected result is Negative.  Fact Sheet for Patients: EntrepreneurPulse.com.au  Fact Sheet for Healthcare Providers: IncredibleEmployment.be  This test is not yet approved or cleared by the [place] FDA and  has been authorized for detection and/or diagnosis of SARS-CoV-2 by FDA under an Emergency Use Authorization (EUA).  This EUA will remain in effect (meaning this test can be used) for the duration of  the COVID-19 declaration under Section 564(b)(1) of the A ct, 21 U.S.C. section 360bbb-3(b)(1), unless  the authorization is terminated or revoked sooner.     Influenza A by PCR NEGATIVE NEGATIVE Final   Influenza B by PCR NEGATIVE NEGATIVE Final    Comment: (NOTE) The Xpert Xpress SARS-CoV-2/FLU/RSV plus assay is intended as an aid in the diagnosis of influenza from Nasopharyngeal swab specimens and should not be used as a sole basis for treatment. Nasal washings and aspirates are unacceptable for Xpert Xpress SARS-CoV-2/FLU/RSV testing.  Fact Sheet for Patients: EntrepreneurPulse.com.au  Fact Sheet for Healthcare Providers: IncredibleEmployment.be  This test is not yet approved or cleared by the [place] FDA and has been authorized for detection and/or diagnosis of SARS-CoV-2 by FDA under an Emergency Use Authorization (EUA). This EUA will remain in effect (meaning this test can be used) for the duration of the COVID-19 declaration under Section 564(b)(1) of the Act, 21 U.S.C. section 360bbb-3(b)(1), unless the authorization is terminated or revoked.  Performed at [facility], [place] [address], [place], [place] [address]          Radiology Studies: No results found.      Scheduled Meds:  carbidopa-levodopa  1 tablet Oral TID   enoxaparin (LOVENOX) injection  40 mg Subcutaneous Q24H   fenofibrate  54 mg Oral Daily   Ipratropium-Albuterol  1 puff Inhalation Q6H   midodrine  10 mg Oral TID WC   risperiDONE  0.5 mg Oral QHS   sertraline  100 mg Oral Daily   simvastatin  10 mg Oral q1800   sodium chloride flush  3 mL Intravenous Q12H   Continuous Infusions:  sodium chloride 30 mL/hr at 08/17/21 1204     LOS: 5 days    Time spent: 35 minutes    Irine Seal, MD [facility]   To contact the attending provider between 7A-7P or the covering provider during after hours 7P-7A, please log into the web site [email or web address] and access using universal [place] password for that web site. If you  do not have the password, please call the hospital operator.  08/18/2021, 9:41 PM

## 2021-08-19 MED ORDER — IPRATROPIUM-ALBUTEROL 20-100 MCG/ACT IN AERS: 1.0000 | INHALATION_SPRAY | Freq: Three times a day (TID) | RESPIRATORY_TRACT | Status: DC

## 2021-08-19 NOTE — Progress Notes (Signed)
Occupational Therapy Treatment Patient Details Name: Fernando Carr MRN: [id number] DOB: 07/18/1941 Today's Date: 08/19/2021   History of present illness 81 y.o. male presents to [facility] ED on 12/21/2022with worsening confusion, refusing medications, poor PO intake, and recent fall. Pt with orthostatic hypotension/syncope from autonomic dysfunction associated with Parkinsons and COVID. PMH includes bipolar disorder, MDD, HLD, AAA, afib, parkinson's disease.   OT comments  Patient is progressing towards goals. Today patient supervision to transfer to side of bed, min guard for standing and ambulation with RW. He demonstrated ability to pull up his socks and stand at sink for grooming. No complaints of dizziness today. Patient somewhat irritable and reports "nobody is doing anything here." He tells therapist that therapy is a waste of time but then he is also upset that therapist is leaving after treatment. Continue to recommend short term rehab at discharge.  He may need to bridge out of independent living in to ALF in the near future.    Recommendations for follow up therapy are one component of a multi-disciplinary discharge planning process, led by the attending physician.  Recommendations may be updated based on patient status, additional functional criteria and insurance authorization.    Follow Up Recommendations  Skilled nursing-short term rehab (<3 hours/day)    Assistance Recommended at Discharge Frequent or constant Supervision/Assistance  Patient can return home with the following  A little help with walking and/or transfers;A little help with bathing/dressing/bathroom   Equipment Recommendations  None recommended by OT    Recommendations for Other Services      Precautions / Restrictions Precautions Precautions: Fall Precaution Comments: history of syncope (5th admission in last 6 months for this), monitor HR and BP Restrictions Weight Bearing Restrictions: No       Mobility Bed  Mobility                    Transfers                         Balance Overall balance assessment: Mild deficits observed, not formally tested           Standing balance-Leahy Scale: Fair Standing balance comment: able to stand at sink without UE support                           ADL either performed or assessed with clinical judgement   ADL Overall ADL's : Needs assistance/impaired     Grooming: Min guard;Standing;Oral care;Wash/dry face Grooming Details (indicate cue type and reason): able to stand at sink with min guard assist to wash face and perform oral care. min guard only.             Lower Body Dressing: Minimal assistance Lower Body Dressing Details (indicate cue type and reason): able to bend down and pull up socks.               General ADL Comments: patient supervision to transfer to side of bed and min guard with RW to ambulate to sink and stand. After grooming task patient placed in recliner.    Extremity/Trunk Assessment Upper Extremity Assessment Upper Extremity Assessment: Overall WFL for tasks assessed RUE Deficits / Details: BUE resting tremor, grossly 4/5 strength LUE Deficits / Details: BUE resting tremor, grossly 4/5 strength       Cervical / Trunk Assessment Cervical / Trunk Assessment: Kyphotic    Vision   Vision Assessment?: No apparent  visual deficits   Perception     Praxis      Cognition Arousal/Alertness: Awake/alert Behavior During Therapy: Flat affect Overall Cognitive Status: Within Functional Limits for tasks assessed                                                     Pertinent Vitals/ Pain       Pain Assessment Pain Assessment: No/denies pain   Frequency  Min 2X/week        Progress Toward Goals  OT Goals(current goals can now be found in the care plan section)  Progress towards OT goals: Progressing toward goals  Acute Rehab OT Goals Patient Stated  Goal: did not state Time For Goal Achievement: 08/26/21 Potential to Achieve Goals: Good  Plan Discharge plan remains appropriate    Co-evaluation                 AM-PAC OT "6 Clicks" Daily Activity     Outcome Measure   Help from another person eating meals?: A Little Help from another person taking care of personal grooming?: A Little Help from another person toileting, which includes using toliet, bedpan, or urinal?: A Little Help from another person bathing (including washing, rinsing, drying)?: A Little Help from another person to put on and taking off regular upper body clothing?: A Little Help from another person to put on and taking off regular lower body clothing?: A Little 6 Click Score: 18    End of Session Equipment Utilized During Treatment: Rolling walker (2 wheels)  OT Visit Diagnosis: History of falling (Z91.81)   Activity Tolerance Patient tolerated treatment well   Patient Left in chair;with call bell/phone within reach;with chair alarm set   Nurse Communication Mobility status        Time: 8299-3716 OT Time Calculation (min): 16 min  Charges: OT General Charges $OT Visit: 1 Visit OT Treatments $Self Care/Home Management : 8-22 mins  Derl Barrow, OTR/L [place]  Office [phone] Pager: [phone]   Lenward Chancellor 08/19/2021, 12:05 PM

## 2021-08-19 NOTE — Progress Notes (Signed)
PROGRESS NOTE    Fernando Carr  GYJ:[id number] DOB: [birth year] DOA: 08/11/2021 PCP: [name], MD    Chief Complaint  Patient presents with   Syncope    Syncope     Brief Narrative:  Fernando Carr is a 81 y.o. male with medical history significant for Parkinson's disease, paroxysmal A. fib not on anticoagulation due to frequent falls, recurrent syncope, bipolar disorder type 1, CKD 3B who presented to [facility] ED after briefly losing consciousness at ALF.  Witnessed by nursing staff.  EMS was activated, they witnessed 3 syncopal episodes.  No postictal symptoms.  Patient endorses that he felt very weak prior to passing out.  No chest pain or palpitations.  States he did not have much to drink or eat prior to this event.  He was in his usual state of health prior.  At the time of this interview, he is alert and oriented x 3.  Work up revealed hypovolemia, AKI, and orthostatic hypotension.  Received IV fluids.  He was seen by cardiology.  His syncope thought secondary to orthostatic hypotension likely from autonomic dysfunction in the setting of Parkinson disease.    COVID-19 viral infection positive on 08/11/2021, received 5 days of Molnupiravir to end on 08/17/2021.  Not hypoxic therefore steroids were not started.   Seen by PT OT with recommendation for SNF.  TOC assisting with SNF placement.  Isolation window 10 days to end of 08/21/2021.   Assessment & Plan:   Principal Problem:   Syncope Active Problems:   Depression   Bipolar disorder (HCC)   AF (paroxysmal atrial fibrillation) (HCC)   Neurogenic orthostatic hypotension (HCC)   Dementia due to Parkinson's disease with behavioral disturbance (HCC)   COVID-19   Debility   Anxiety   Autonomic dysfunction   Orthostatic hypotension   #1 recurrent syncope, can due to orthostatic hypotension from autonomic dysfunction in the setting of Parkinson's disease -Patient noted on presentation to be hypokalemic, dehydrated noted to have a  prodrome of weakness prior to losing consciousness. -Orthostatic vitals checked were positive on admission. -Patient hydrated with IV fluids. -Continue home regimen midodrine, TED hose, abdominal binders. -Fall precautions. -Patient seen in consultation by cardiology felt syncopal episode secondary to orthostatic hypotension secondary to autonomic dysfunction. -Patient seen by PT who are recommending SNF.  2.  Autonomic dysfunction in setting of Parkinson's disease with recurrent syncope -Patient was hydrated IV fluids. -Fall precautions. -Midodrine, abdominal binder, TED hose.    3.  COVID-19 viral infection -Patient noted not to be hypoxic with sats of 96-100% on room air. -Status post 5-day course of antiviral molnupiravir. -Inflammatory markers have trended down.  4.  AKI on CKD stage IIIb, prerenal secondary to dehydration -Renal function improved with hydration creatinine noted on admission 2.95, trended down currently at 1.53. -Avoid nephrotoxic agents. -Supportive care.  5.  Parkinson's disease -Continue Sinemet. -Fall precautions. -Outpatient follow-up.  6.  Hyperlipidemia -Fenofibrate, simvastatin.  7.  Depression/chronic anxiety/bipolar disorder -Continue Zoloft, risperidone.   8.  Paroxysmal atrial fibrillation -Rate controlled. -Not a anticoagulation candidate due to history of frequent falls.  9.  Anemia of chronic disease in the setting of CKD -Stable.  10.  Debility -Seen by PT OT who are recommending SNF placement. -Patient likely requires 10 days of isolation for SNF placement which ends on 08/21/2021.   DVT prophylaxis: Lovenox Code Status: Full Family Communication: Updated patient.  No family at bedside. Disposition:   Status is: Inpatient  Remains inpatient appropriate because:  Unsafe disposition.     Consultants:  Cardiology: Dr.Nishan 13 2022  Procedures:  CT head CT C-spine 08/11/2021    Antimicrobials:  IV Rocephin  08/12/2021>>>> 08/14/2021 Molnupiravir 08/13/2021>>>> 08/18/2021    Subjective: Sitting up in chair.  No chest pain.  No shortness of breath.  Wondering where he will be going for rehab.   Objective: Vitals:   08/18/21 0500 08/18/21 1339 08/18/21 2032 08/19/21 0551  BP:  (!) 137/92 135/68 134/78  Pulse:  (!) 55 (!) 51 (!) 52  Resp:  20 16 18   Temp:  98.1 F (36.7 C) 97.9 F (36.6 C) (!) 97.5 F (36.4 C)  TempSrc:  Oral  Oral  SpO2:  97% 100% 97%  Weight: 90.6 kg       Intake/Output Summary (Last 24 hours) at 08/19/2021 1223 Last data filed at 08/19/2021 1130 Gross per 24 hour  Intake 696 ml  Output 1300 ml  Net -604 ml    Filed Weights   08/16/21 2011 08/17/21 0232 08/18/21 0500  Weight: 90.8 kg 90.8 kg 90.6 kg    Examination:  General exam: Pill-rolling tremors.  Respiratory system: CTA B.  No wheezes, no crackles, no rhonchi.  Normal respiratory effort.  Speaking in full sentences.   Cardiovascular system: Regular rate rhythm no murmurs rubs or gallops.  No JVD.  No lower extremity edema.  Gastrointestinal system: Abdomen is soft, nontender, nondistended, positive bowel sounds.  No rebound.  No guarding Central nervous system: Alert and oriented. No focal neurological deficits. Extremities: Symmetric 5 x 5 power. Skin: No rashes, lesions or ulcers Psychiatry: Judgement and insight appear fair. Mood & affect appropriate.     Data Reviewed: I have personally reviewed following labs and imaging studies  CBC: Recent Labs  Lab 08/14/21 0550 08/18/21 0447  WBC 5.3 6.1  NEUTROABS 3.3  --   HGB 10.0* 9.9*  HCT 33.5* 32.4*  MCV 90.1 90.3  PLT 178 183     Basic Metabolic Panel: Recent Labs  Lab 08/14/21 0550 08/15/21 0604 08/18/21 0447  NA 138 137 135  K 4.5 4.1 4.6  CL 106 104 105  CO2 25 26 25   GLUCOSE 85 86 88  BUN 42* 39* 42*  CREATININE 1.39* 1.29* 1.53*  CALCIUM 10.0 10.0 10.0  MG  --  2.0 2.2  PHOS  --  3.1 3.5     GFR: Estimated  Creatinine Clearance: 42.3 mL/min (A) (by C-G formula based on SCr of 1.53 mg/dL (H)).  Liver Function Tests: Recent Labs  Lab 08/14/21 0550  AST 11*  ALT <5  ALKPHOS 77  BILITOT 0.4  PROT 6.5  ALBUMIN 3.3*     CBG: Recent Labs  Lab 08/14/21 0643 08/15/21 0541 08/16/21 0341 08/17/21 0334 08/18/21 0005  GLUCAP 101* 84 92 78 127*      Recent Results (from the past 240 hour(s))  Resp Panel by RT-PCR (Flu A&B, Covid) Nasopharyngeal Swab     Status: Abnormal   Collection Time: 08/11/21  5:24 PM   Specimen: Nasopharyngeal Swab; Nasopharyngeal(NP) swabs in vial transport medium  Result Value Ref Range Status   SARS Coronavirus 2 by RT PCR POSITIVE (A) NEGATIVE Final    Comment: (NOTE) SARS-CoV-2 target nucleic acids are DETECTED.  The SARS-CoV-2 RNA is generally detectable in upper respiratory specimens during the acute phase of infection. Positive results are indicative of the presence of the identified virus, but do not rule out bacterial infection or co-infection with other pathogens not detected by  the test. Clinical correlation with patient history and other diagnostic information is necessary to determine patient infection status. The expected result is Negative.  Fact Sheet for Patients: EntrepreneurPulse.com.au  Fact Sheet for Healthcare Providers: IncredibleEmployment.be  This test is not yet approved or cleared by the [place] FDA and  has been authorized for detection and/or diagnosis of SARS-CoV-2 by FDA under an Emergency Use Authorization (EUA).  This EUA will remain in effect (meaning this test can be used) for the duration of  the COVID-19 declaration under Section 564(b)(1) of the A ct, 21 U.S.C. section 360bbb-3(b)(1), unless the authorization is terminated or revoked sooner.     Influenza A by PCR NEGATIVE NEGATIVE Final   Influenza B by PCR NEGATIVE NEGATIVE Final    Comment: (NOTE) The Xpert Xpress  SARS-CoV-2/FLU/RSV plus assay is intended as an aid in the diagnosis of influenza from Nasopharyngeal swab specimens and should not be used as a sole basis for treatment. Nasal washings and aspirates are unacceptable for Xpert Xpress SARS-CoV-2/FLU/RSV testing.  Fact Sheet for Patients: EntrepreneurPulse.com.au  Fact Sheet for Healthcare Providers: IncredibleEmployment.be  This test is not yet approved or cleared by the [place] FDA and has been authorized for detection and/or diagnosis of SARS-CoV-2 by FDA under an Emergency Use Authorization (EUA). This EUA will remain in effect (meaning this test can be used) for the duration of the COVID-19 declaration under Section 564(b)(1) of the Act, 21 U.S.C. section 360bbb-3(b)(1), unless the authorization is terminated or revoked.  Performed at [facility], [place] [address], [place], [place] [address]           Radiology Studies: No results found.      Scheduled Meds:  carbidopa-levodopa  1 tablet Oral TID   enoxaparin (LOVENOX) injection  40 mg Subcutaneous Q24H   fenofibrate  54 mg Oral Daily   Ipratropium-Albuterol  1 puff Inhalation Q6H   midodrine  10 mg Oral TID WC   risperiDONE  0.5 mg Oral QHS   sertraline  100 mg Oral Daily   simvastatin  10 mg Oral q1800   sodium chloride flush  3 mL Intravenous Q12H   Continuous Infusions:     LOS: 6 days    Time spent: 35 minutes    Irine Seal, MD [facility]   To contact the attending provider between 7A-7P or the covering provider during after hours 7P-7A, please log into the web site [email or web address] and access using universal [place] password for that web site. If you do not have the password, please call the hospital operator.  08/19/2021, 12:23 PM

## 2021-08-20 LAB — BASIC METABOLIC PANEL
Anion gap: 4 — ABNORMAL LOW (ref 5–15)
BUN: 38 mg/dL — ABNORMAL HIGH (ref 8–23)
CO2: 27 mmol/L (ref 22–32)
Calcium: 9.7 mg/dL (ref 8.9–10.3)
Chloride: 105 mmol/L (ref 98–111)
Creatinine, Ser: 1.4 mg/dL — ABNORMAL HIGH (ref 0.61–1.24)
GFR, Estimated: 51 mL/min — ABNORMAL LOW (ref >60)
Glucose, Bld: 131 mg/dL — ABNORMAL HIGH (ref 70–99)
Potassium: 4.4 mmol/L (ref 3.5–5.1)
Sodium: 136 mmol/L (ref 135–145)

## 2021-08-20 LAB — GLUCOSE, CAPILLARY: Glucose-Capillary: 82 mg/dL (ref 70–99)

## 2021-08-20 NOTE — Care Management Important Message (Signed)
Important Message  Patient Details IM Letter placed in Patients room. Name: Fernando Carr MRN: [id number] Date of Birth: 1941-07-09   Medicare Important Message Given:  Yes     Kerin Salen 08/20/2021, 10:13 AM

## 2021-08-20 NOTE — NC FL2 (Signed)
[place] LEVEL OF CARE SCREENING TOOL     IDENTIFICATION  Patient Name: Fernando Carr Birthdate: [birth year] Sex: male Admission Date (Current Location): 08/11/2021  [facility] and [place] Number:  Herbalist and Address:  [facility],  [place], [place]      Provider Number: [id number]  Attending Physician Name and Address:  Eugenie Filler, MD  Relative Name and Phone Number:  [name], [phone]    Current Level of Care: Hospital Recommended Level of Care: [place] Prior Approval Number:    Date Approved/Denied:   PASRR Number: [id number]  Discharge Plan: SNF    Current Diagnoses: Patient Active Problem List   Diagnosis Date Noted   COVID-19    Debility    Anxiety    Autonomic dysfunction    Orthostatic hypotension    Confusion 07/23/2021   Syncope and collapse 03/24/2021   Dementia due to Parkinson's disease with behavioral disturbance (HCC)    Resting tremor    Nicotine dependence    Neurogenic orthostatic hypotension ([place]) 01/29/2021   Hip fracture ([place]) 08/13/2020   LVH (left ventricular hypertrophy) 11/14/2019   Educated about COVID-19 virus infection 11/14/2019   AKI (acute kidney injury) ([place])    Syncope 09/26/2019   MDD (major depressive disorder), recurrent severe, without psychosis ([place]) 08/20/2014   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   Severe depression ([place])    Major depressive disorder, recurrent, severe without psychotic features ([place])    AAA (abdominal aortic aneurysm) 07/07/2014   Dehydration 03/10/2014   FTT (failure to thrive) in adult 03/10/2014   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   Sinus bradycardia 03/10/2014   HTN (hypertension) 03/10/2014   AF (paroxysmal atrial fibrillation) ([place]) 03/09/2014   Hypotension, postural 03/09/2014   CRD (chronic renal disease), stage III ([place]) 01/09/2014   Abdominal aortic aneurysm 12/20/2011   Bipolar disorder  ([place]) 10/24/2011   Depression 10/10/2011   Hyperlipemia 10/10/2011   Hypertriglyceridemia 10/03/2011   Hyperlipidemia 03/24/2011    Orientation RESPIRATION BLADDER Height & Weight     Self, Time, Situation, Place    Incontinent Weight: 90.6 kg Height:     BEHAVIORAL SYMPTOMS/MOOD NEUROLOGICAL BOWEL NUTRITION STATUS      Incontinent    AMBULATORY STATUS COMMUNICATION OF NEEDS Skin   Limited Assist   Skin abrasions, Bruising                       Personal Care Assistance Level of Assistance  Bathing, Dressing, Feeding Bathing Assistance: Limited assistance Feeding assistance: Independent Dressing Assistance: Limited assistance     Functional Limitations Info  Sight, Hearing, Speech Sight Info: Impaired Hearing Info: Adequate Speech Info: Adequate    SPECIAL CARE FACTORS FREQUENCY        PT Frequency: 5 x weekly OT Frequency: 5 x weekly            Contractures Contractures Info: Not present    Additional Factors Info  Code Status, Allergies Code Status Info: Full Allergies Info: None known           Current Medications (08/20/2021):  This is the current hospital active medication list Current Facility-Administered Medications  Medication Dose Route Frequency Provider Last Rate Last Admin   acetaminophen (TYLENOL) tablet 650 mg  650 mg Oral Q6H PRN Hall, Carole N, DO       carbidopa-levodopa (SINEMET IR) 25-100 MG per tablet immediate release 1 tablet  1 tablet Oral  TID Irene Pap N, DO   1 tablet at 08/19/21 2228   enoxaparin (LOVENOX) injection 40 mg  40 mg Subcutaneous Q24H Suzzanne Cloud, RPH   40 mg at 08/19/21 1026   fenofibrate tablet 54 mg  54 mg Oral Daily Kayleen Memos, DO   54 mg at 08/19/21 1026   Ipratropium-Albuterol (COMBIVENT) respimat 1 puff  1 puff Inhalation TID Eugenie Filler, MD       melatonin tablet 3 mg  3 mg Oral QHS PRN Irene Pap N, DO   3 mg at 08/17/21 2056   midodrine (PROAMATINE) tablet 10 mg  10 mg Oral TID WC  Barrett, Rhonda G, PA-C   10 mg at 08/20/21 0800   risperiDONE (RISPERDAL) tablet 0.5 mg  0.5 mg Oral QHS Eugenie Filler, MD   0.5 mg at 08/19/21 2227   sertraline (ZOLOFT) tablet 100 mg  100 mg Oral Daily Irene Pap N, DO   100 mg at 08/19/21 1025   simvastatin (ZOCOR) tablet 10 mg  10 mg Oral q1800 Irene Pap N, DO   10 mg at 08/19/21 1831   sodium chloride flush (NS) 0.9 % injection 3 mL  3 mL Intravenous Q12H Irene Pap N, DO   3 mL at 08/19/21 2228     Discharge Medications: Please see discharge summary for a list of discharge medications.  Relevant Imaging Results:  Relevant Lab Results:   Additional Information SSN [id number]  [place] COVID-19 Vaccine 10/15/2019 , 09/09/2019.  Jenean Escandon, Marjie Skiff, RN

## 2021-08-20 NOTE — Progress Notes (Signed)
PROGRESS NOTE    Fernando Carr  ZOX:[id number] DOB: 1941-02-26 DOA: 08/11/2021 PCP: [name], MD    Chief Complaint  Patient presents with   Syncope    Syncope     Brief Narrative:  Fernando Carr is a 81 y.o. male with medical history significant for Parkinson's disease, paroxysmal A. fib not on anticoagulation due to frequent falls, recurrent syncope, bipolar disorder type 1, CKD 3B who presented to [facility] ED after briefly losing consciousness at ALF.  Witnessed by nursing staff.  EMS was activated, they witnessed 3 syncopal episodes.  No postictal symptoms.  Patient endorses that he felt very weak prior to passing out.  No chest pain or palpitations.  States he did not have much to drink or eat prior to this event.  He was in his usual state of health prior.  At the time of this interview, he is alert and oriented x 3.  Work up revealed hypovolemia, AKI, and orthostatic hypotension.  Received IV fluids.  He was seen by cardiology.  His syncope thought secondary to orthostatic hypotension likely from autonomic dysfunction in the setting of Parkinson disease.    COVID-19 viral infection positive on 08/11/2021, received 5 days of Molnupiravir to end on 08/17/2021.  Not hypoxic therefore steroids were not started.   Seen by PT OT with recommendation for SNF.  TOC assisting with SNF placement.  Isolation window 10 days to end of 08/21/2021.   Assessment & Plan:   Principal Problem:   Syncope Active Problems:   Depression   Bipolar disorder (HCC)   AF (paroxysmal atrial fibrillation) (HCC)   Neurogenic orthostatic hypotension (HCC)   Dementia due to Parkinson's disease with behavioral disturbance (HCC)   COVID-19   Debility   Anxiety   Autonomic dysfunction   Orthostatic hypotension   #1 recurrent syncope, can due to orthostatic hypotension from autonomic dysfunction in the setting of Parkinson's disease -Patient noted on presentation to be hypokalemic, dehydrated noted to have a  prodrome of weakness prior to losing consciousness. -Orthostatic vitals checked were positive on admission. -Patient hydrated with IV fluids. -Continue home regimen midodrine, TED hose, abdominal binders. -Fall precautions. -Patient seen in consultation by cardiology felt syncopal episode secondary to orthostatic hypotension secondary to autonomic dysfunction. -Patient seen by PT who are recommending SNF.  2.  Autonomic dysfunction in setting of Parkinson's disease with recurrent syncope -Patient was hydrated IV fluids. -Fall precautions. -Continue midodrine, abdominal binder, TED hose.    3.  COVID-19 viral infection -Patient noted not to be hypoxic with sats of 97-100% on room air. -Status post 5-day course of antiviral molnupiravir. -Inflammatory markers have trended down.  4.  AKI on CKD stage IIIb, prerenal secondary to dehydration -Renal function improved with hydration creatinine noted on admission 2.95, trended down currently at 1.53. -Avoid nephrotoxic agents. -Supportive care.  5.  Parkinson's disease -Continue Sinemet. -Fall precautions. -Outpatient follow-up.  6.  Hyperlipidemia -Fenofibrate, simvastatin.  7.  Depression/chronic anxiety/bipolar disorder -Risperidone, Zoloft.  8.  Paroxysmal atrial fibrillation -Currently rate controlled.   -Not a anticoagulation candidate due to history of frequent falls.  9.  Anemia of chronic disease in the setting of CKD -Stable.  10.  Debility -Seen by PT OT who are recommending SNF placement. -Patient likely requires 10 days of isolation for SNF placement which ends on 08/21/2021. -   DVT prophylaxis: Lovenox Code Status: Full Family Communication: Updated patient.  No family at bedside. Disposition:   Status is: Inpatient  Remains  inpatient appropriate because: Unsafe disposition.     Consultants:  Cardiology: Dr.Nishan 13 2022  Procedures:  CT head CT C-spine 08/11/2021    Antimicrobials:  IV  Rocephin 08/12/2021>>>> 08/14/2021 Molnupiravir 08/13/2021>>>> 08/18/2021    Subjective: Sitting up in chair.  No shortness of breath.  No chest pain.    Objective: Vitals:   08/19/21 1420 08/19/21 1850 08/19/21 2033 08/20/21 0558  BP: (!) 198/93 (!) 157/72 107/71 (!) 142/74  Pulse: (!) 56  (!) 58 (!) 51  Resp: 20  20 16   Temp: 97.8 F (36.6 C)  98 F (36.7 C) 97.7 F (36.5 C)  TempSrc: Oral  Oral Oral  SpO2: 100%  97% 97%  Weight:        Intake/Output Summary (Last 24 hours) at 08/20/2021 1256 Last data filed at 08/20/2021 1249 Gross per 24 hour  Intake 992 ml  Output 1550 ml  Net -558 ml    Filed Weights   08/16/21 2011 08/17/21 0232 08/18/21 0500  Weight: 90.8 kg 90.8 kg 90.6 kg    Examination:  General exam: Pill-rolling tremors.  Respiratory system: Lungs clear to auscultation bilaterally.  No wheezes, no crackles, no rhonchi.  Normal respiratory effort.  Speaking in full sentences.  Cardiovascular system: RRR no murmurs rubs or gallops.  No JVD.  No lower extremity edema.  Gastrointestinal system: Abdomen is soft, nontender, nondistended, positive bowel sounds.  No rebound.  No guarding.  Central nervous system: Alert and oriented. No focal neurological deficits. Extremities: Symmetric 5 x 5 power. Skin: No rashes, lesions or ulcers Psychiatry: Judgement and insight appear fair. Mood & affect appropriate.     Data Reviewed: I have personally reviewed following labs and imaging studies  CBC: Recent Labs  Lab 08/14/21 0550 08/18/21 0447  WBC 5.3 6.1  NEUTROABS 3.3  --   HGB 10.0* 9.9*  HCT 33.5* 32.4*  MCV 90.1 90.3  PLT 178 183     Basic Metabolic Panel: Recent Labs  Lab 08/14/21 0550 08/15/21 0604 08/18/21 0447 08/20/21 0908  NA 138 137 135 136  K 4.5 4.1 4.6 4.4  CL 106 104 105 105  CO2 25 26 25 27   GLUCOSE 85 86 88 131*  BUN 42* 39* 42* 38*  CREATININE 1.39* 1.29* 1.53* 1.40*  CALCIUM 10.0 10.0 10.0 9.7  MG  --  2.0 2.2  --   PHOS  --   3.1 3.5  --      GFR: Estimated Creatinine Clearance: 46.2 mL/min (A) (by C-G formula based on SCr of 1.4 mg/dL (H)).  Liver Function Tests: Recent Labs  Lab 08/14/21 0550  AST 11*  ALT <5  ALKPHOS 77  BILITOT 0.4  PROT 6.5  ALBUMIN 3.3*     CBG: Recent Labs  Lab 08/15/21 0541 08/16/21 0341 08/17/21 0334 08/18/21 0005 08/20/21 0505  GLUCAP 84 92 78 127* 82      Recent Results (from the past 240 hour(s))  Resp Panel by RT-PCR (Flu A&B, Covid) Nasopharyngeal Swab     Status: Abnormal   Collection Time: 08/11/21  5:24 PM   Specimen: Nasopharyngeal Swab; Nasopharyngeal(NP) swabs in vial transport medium  Result Value Ref Range Status   SARS Coronavirus 2 by RT PCR POSITIVE (A) NEGATIVE Final    Comment: (NOTE) SARS-CoV-2 target nucleic acids are DETECTED.  The SARS-CoV-2 RNA is generally detectable in upper respiratory specimens during the acute phase of infection. Positive results are indicative of the presence of the identified virus, but do not  rule out bacterial infection or co-infection with other pathogens not detected by the test. Clinical correlation with patient history and other diagnostic information is necessary to determine patient infection status. The expected result is Negative.  Fact Sheet for Patients: EntrepreneurPulse.com.au  Fact Sheet for Healthcare Providers: IncredibleEmployment.be  This test is not yet approved or cleared by the [place] FDA and  has been authorized for detection and/or diagnosis of SARS-CoV-2 by FDA under an Emergency Use Authorization (EUA).  This EUA will remain in effect (meaning this test can be used) for the duration of  the COVID-19 declaration under Section 564(b)(1) of the A ct, 21 U.S.C. section 360bbb-3(b)(1), unless the authorization is terminated or revoked sooner.     Influenza A by PCR NEGATIVE NEGATIVE Final   Influenza B by PCR NEGATIVE NEGATIVE Final     Comment: (NOTE) The Xpert Xpress SARS-CoV-2/FLU/RSV plus assay is intended as an aid in the diagnosis of influenza from Nasopharyngeal swab specimens and should not be used as a sole basis for treatment. Nasal washings and aspirates are unacceptable for Xpert Xpress SARS-CoV-2/FLU/RSV testing.  Fact Sheet for Patients: EntrepreneurPulse.com.au  Fact Sheet for Healthcare Providers: IncredibleEmployment.be  This test is not yet approved or cleared by the [place] FDA and has been authorized for detection and/or diagnosis of SARS-CoV-2 by FDA under an Emergency Use Authorization (EUA). This EUA will remain in effect (meaning this test can be used) for the duration of the COVID-19 declaration under Section 564(b)(1) of the Act, 21 U.S.C. section 360bbb-3(b)(1), unless the authorization is terminated or revoked.  Performed at [facility], [place] [address], [place], [place] [address]           Radiology Studies: No results found.      Scheduled Meds:  carbidopa-levodopa  1 tablet Oral TID   enoxaparin (LOVENOX) injection  40 mg Subcutaneous Q24H   fenofibrate  54 mg Oral Daily   Ipratropium-Albuterol  1 puff Inhalation TID   midodrine  10 mg Oral TID WC   risperiDONE  0.5 mg Oral QHS   sertraline  100 mg Oral Daily   simvastatin  10 mg Oral q1800   sodium chloride flush  3 mL Intravenous Q12H   Continuous Infusions:     LOS: 7 days    Time spent: 35 minutes    Irine Seal, MD [facility]   To contact the attending provider between 7A-7P or the covering provider during after hours 7P-7A, please log into the web site [email or web address] and access using universal [place] password for that web site. If you do not have the password, please call the hospital operator.  08/20/2021, 12:56 PM

## 2021-08-20 NOTE — TOC Initial Note (Signed)
Transition of Care [facility]) - Initial/Assessment Note    Patient Details  Name: Fernando Carr MRN: [id number] Date of Birth: 10/28/1940  Transition of [facility]) CM/SW Contact:    Lynnell Catalan, RN Phone Number: 08/20/2021, 1:43 PM  Clinical Narrative:                  Spoke to pt for dc planning. Pt from [name]. Pt hesitant to agree to SNF but did agree to let me fax him out for SNF bed options. FL2 faxed out. Pt will need SNF bed offers and insurance auth started over the weekend as pt will be out of quarantine on 1/22. TOC will continue to follow.             Admission diagnosis:  Syncope [R55] AKI (acute kidney injury) ([place]) [N17.9] Syncope, unspecified syncope type [R55] Patient Active Problem List   Diagnosis Date Noted   COVID-19    Debility    Anxiety    Autonomic dysfunction    Orthostatic hypotension    Confusion 07/23/2021   Syncope and collapse 03/24/2021   Dementia due to Parkinson's disease with behavioral disturbance (HCC)    Resting tremor    Nicotine dependence    Neurogenic orthostatic hypotension ([place]) 01/29/2021   Hip fracture ([place]) 08/13/2020   LVH (left ventricular hypertrophy) 11/14/2019   Educated about COVID-19 virus infection 11/14/2019   AKI (acute kidney injury) ([place])    Syncope 09/26/2019   MDD (major depressive disorder), recurrent severe, without psychosis ([place]) 08/20/2014   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   Severe depression ([place])    Major depressive disorder, recurrent, severe without psychotic features ([place])    AAA (abdominal aortic aneurysm) 07/07/2014   Dehydration 03/10/2014   FTT (failure to thrive) in adult 03/10/2014   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 [place] [address] Phone: [phone] Fax: [phone]     Social Determinants of Health (SDOH) Interventions    Readmission Risk Interventions No flowsheet data found.

## 2021-08-21 LAB — GLUCOSE, CAPILLARY: Glucose-Capillary: 80 mg/dL (ref 70–99)

## 2021-08-21 MED ORDER — ORAL CARE MOUTH RINSE
15.0000 mL | Freq: Two times a day (BID) | OROMUCOSAL | Status: DC
  Administered 2021-08-21 – 2021-08-25 (×7): 15 mL via OROMUCOSAL

## 2021-08-21 MED ORDER — IPRATROPIUM-ALBUTEROL 20-100 MCG/ACT IN AERS: 1.0000 | INHALATION_SPRAY | Freq: Four times a day (QID) | RESPIRATORY_TRACT | Status: DC | PRN

## 2021-08-21 NOTE — Progress Notes (Signed)
PROGRESS NOTE    Fernando Carr  WJX:[id number] DOB: 1940-09-01 DOA: 08/11/2021 PCP: [name], MD    Chief Complaint  Patient presents with   Syncope    Syncope     Brief Narrative:  Fernando Carr is a 81 y.o. male with medical history significant for Parkinson's disease, paroxysmal A. fib not on anticoagulation due to frequent falls, recurrent syncope, bipolar disorder type 1, CKD 3B who presented to [facility] ED after briefly losing consciousness at ALF.  Witnessed by nursing staff.  EMS was activated, they witnessed 3 syncopal episodes.  No postictal symptoms.  Patient endorses that he felt very weak prior to passing out.  No chest pain or palpitations.  States he did not have much to drink or eat prior to this event.  He was in his usual state of health prior.  At the time of this interview, he is alert and oriented x 3.  Work up revealed hypovolemia, AKI, and orthostatic hypotension.  Received IV fluids.  He was seen by cardiology.  His syncope thought secondary to orthostatic hypotension likely from autonomic dysfunction in the setting of Parkinson disease.    COVID-19 viral infection positive on 08/11/2021, received 5 days of Molnupiravir to end on 08/17/2021.  Not hypoxic therefore steroids were not started.   Seen by PT OT with recommendation for SNF.  TOC assisting with SNF placement.  Isolation window 10 days to end of 08/21/2021.   Assessment & Plan:   Principal Problem:   Syncope Active Problems:   Depression   Bipolar disorder (HCC)   AF (paroxysmal atrial fibrillation) (HCC)   Neurogenic orthostatic hypotension (HCC)   Dementia due to Parkinson's disease with behavioral disturbance (HCC)   COVID-19   Debility   Anxiety   Autonomic dysfunction   Orthostatic hypotension   #1 recurrent syncope, can due to orthostatic hypotension from autonomic dysfunction in the setting of Parkinson's disease -Patient noted on presentation to be hypokalemic, dehydrated noted to have a  prodrome of weakness prior to losing consciousness. -Orthostatic vitals checked were positive on admission. -Patient hydrated with IV fluids. -Continue home regimen midodrine, TED hose, abdominal binders. -Fall precautions. -Patient seen in consultation by cardiology felt syncopal episode secondary to orthostatic hypotension secondary to autonomic dysfunction. -Patient seen by PT who are recommending SNF.  2.  Autonomic dysfunction in setting of Parkinson's disease with recurrent syncope -Patient was hydrated IV fluids. -Fall precautions. -Continue midodrine, abdominal binder, TED hose.    3.  COVID-19 viral infection -Patient noted not to be hypoxic with sats of 97-100% on room air. -Status post 5-day course of antiviral molnupiravir. -Inflammatory markers have trended down.  4.  AKI on CKD stage IIIb, prerenal secondary to dehydration -Renal function improved with hydration creatinine noted on admission 2.95, trended down currently at 1.40. -Avoid nephrotoxic agents. -Supportive care.  5.  Parkinson's disease -Continue Sinemet.  Fall precautions.  Outpatient follow-up.   6.  Hyperlipidemia -Continue simvastatin, fenofibrate.    7.  Depression/chronic anxiety/bipolar disorder -Continue risperidone, Zoloft.  8.  Paroxysmal atrial fibrillation -Currently rate controlled.   -Not a anticoagulation candidate due to history of frequent falls.  9.  Anemia of chronic disease in the setting of CKD -Stable.  10.  Debility -Seen by PT OT who are recommending SNF placement. -Patient likely requires 10 days of isolation for SNF placement which ends on 08/21/2021. -We will discontinue airborne precautions tomorrow.   DVT prophylaxis: Lovenox Code Status: Full Family Communication: Updated patient.  No family at bedside. Disposition:   Status is: Inpatient  Remains inpatient appropriate because: Unsafe disposition.     Consultants:  Cardiology: Dr.Nishan 13  2022  Procedures:  CT head CT C-spine 08/11/2021    Antimicrobials:  IV Rocephin 08/12/2021>>>> 08/14/2021 Molnupiravir 08/13/2021>>>> 08/18/2021    Subjective: Laying in bed.  No chest pain.  No shortness of breath.  No abdominal pain.  Overall feeling better.   Objective: Vitals:   08/20/21 1305 08/20/21 2142 08/21/21 0500 08/21/21 0500  BP: 130/60 (!) 164/79  133/76  Pulse: (!) 58 (!) 56  (!) 51  Resp: 16 18  20   Temp: 98.3 F (36.8 C) 98 F (36.7 C)  (!) 97.5 F (36.4 C)  TempSrc: Oral Oral  Oral  SpO2: 100% 95%  94%  Weight:   92.7 kg     Intake/Output Summary (Last 24 hours) at 08/21/2021 1319 Last data filed at 08/21/2021 1153 Gross per 24 hour  Intake 417 ml  Output 1750 ml  Net -1333 ml    Filed Weights   08/17/21 0232 08/18/21 0500 08/21/21 0500  Weight: 90.8 kg 90.6 kg 92.7 kg    Examination:  General exam: Pill-rolling tremors. Respiratory system: CTA B.  No wheezes, no crackles, no rhonchi.  Normal respiratory effort.  Speaking in full sentences.  Cardiovascular system: Regular rate and rhythm no murmurs rubs or gallops.  No JVD.  No lower extremity edema. Gastrointestinal system: Abdomen is soft, nontender, nondistended, positive bowel sounds.  No rebound.  No guarding.  Central nervous system: Alert and oriented. No focal neurological deficits. Extremities: Symmetric 5 x 5 power. Skin: No rashes, lesions or ulcers Psychiatry: Judgement and insight appear fair. Mood & affect appropriate.     Data Reviewed: I have personally reviewed following labs and imaging studies  CBC: Recent Labs  Lab 08/18/21 0447  WBC 6.1  HGB 9.9*  HCT 32.4*  MCV 90.3  PLT 183     Basic Metabolic Panel: Recent Labs  Lab 08/15/21 0604 08/18/21 0447 08/20/21 0908  NA 137 135 136  K 4.1 4.6 4.4  CL 104 105 105  CO2 26 25 27   GLUCOSE 86 88 131*  BUN 39* 42* 38*  CREATININE 1.29* 1.53* 1.40*  CALCIUM 10.0 10.0 9.7  MG 2.0 2.2  --   PHOS 3.1 3.5  --       GFR: Estimated Creatinine Clearance: 46.2 mL/min (A) (by C-G formula based on SCr of 1.4 mg/dL (H)).  Liver Function Tests: No results for input(s): AST, ALT, ALKPHOS, BILITOT, PROT, ALBUMIN in the last 168 hours.   CBG: Recent Labs  Lab 08/16/21 0341 08/17/21 0334 08/18/21 0005 08/20/21 0505 08/21/21 0502  GLUCAP 92 78 127* 82 80      Recent Results (from the past 240 hour(s))  Resp Panel by RT-PCR (Flu A&B, Covid) Nasopharyngeal Swab     Status: Abnormal   Collection Time: 08/11/21  5:24 PM   Specimen: Nasopharyngeal Swab; Nasopharyngeal(NP) swabs in vial transport medium  Result Value Ref Range Status   SARS Coronavirus 2 by RT PCR POSITIVE (A) NEGATIVE Final    Comment: (NOTE) SARS-CoV-2 target nucleic acids are DETECTED.  The SARS-CoV-2 RNA is generally detectable in upper respiratory specimens during the acute phase of infection. Positive results are indicative of the presence of the identified virus, but do not rule out bacterial infection or co-infection with other pathogens not detected by the test. Clinical correlation with patient history and other diagnostic information is  necessary to determine patient infection status. The expected result is Negative.  Fact Sheet for Patients: EntrepreneurPulse.com.au  Fact Sheet for Healthcare Providers: IncredibleEmployment.be  This test is not yet approved or cleared by the [place] FDA and  has been authorized for detection and/or diagnosis of SARS-CoV-2 by FDA under an Emergency Use Authorization (EUA).  This EUA will remain in effect (meaning this test can be used) for the duration of  the COVID-19 declaration under Section 564(b)(1) of the A ct, 21 U.S.C. section 360bbb-3(b)(1), unless the authorization is terminated or revoked sooner.     Influenza A by PCR NEGATIVE NEGATIVE Final   Influenza B by PCR NEGATIVE NEGATIVE Final    Comment: (NOTE) The Xpert Xpress  SARS-CoV-2/FLU/RSV plus assay is intended as an aid in the diagnosis of influenza from Nasopharyngeal swab specimens and should not be used as a sole basis for treatment. Nasal washings and aspirates are unacceptable for Xpert Xpress SARS-CoV-2/FLU/RSV testing.  Fact Sheet for Patients: EntrepreneurPulse.com.au  Fact Sheet for Healthcare Providers: IncredibleEmployment.be  This test is not yet approved or cleared by the [place] FDA and has been authorized for detection and/or diagnosis of SARS-CoV-2 by FDA under an Emergency Use Authorization (EUA). This EUA will remain in effect (meaning this test can be used) for the duration of the COVID-19 declaration under Section 564(b)(1) of the Act, 21 U.S.C. section 360bbb-3(b)(1), unless the authorization is terminated or revoked.  Performed at [facility], [place] [address], [place], [place] [address]           Radiology Studies: No results found.      Scheduled Meds:  carbidopa-levodopa  1 tablet Oral TID   enoxaparin (LOVENOX) injection  40 mg Subcutaneous Q24H   fenofibrate  54 mg Oral Daily   midodrine  10 mg Oral TID WC   risperiDONE  0.5 mg Oral QHS   sertraline  100 mg Oral Daily   simvastatin  10 mg Oral q1800   sodium chloride flush  3 mL Intravenous Q12H   Continuous Infusions:     LOS: 8 days    Time spent: 35 minutes    Irine Seal, MD [facility]   To contact the attending provider between 7A-7P or the covering provider during after hours 7P-7A, please log into the web site [email or web address] and access using universal [place] password for that web site. If you do not have the password, please call the hospital operator.  08/21/2021, 1:19 PM

## 2021-08-21 NOTE — Progress Notes (Signed)
Physical Therapy Treatment Patient Details Name: Fernando Carr MRN: [id number] DOB: 22-Dec-1940 Today's Date: 08/21/2021   History of Present Illness 81 y.o. male [facility] ED on 12/21/2022with worsening confusion, refusing medications, poor PO intake, and recent fall. Pt with orthostatic hypotension/syncope from autonomic dysfunction associated with Parkinsons and COVID. PMH includes bipolar disorder, MDD, HLD, AAA, afib, parkinson's disease.    PT Comments    General Comments: flat affect, little verbalization but will answer questions.  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 Pt in bed incont urine.  Assisted to bathroom for a wash up.  General bed mobility comments: Increased time, pt using momentum of legs to lift trunk with min A.  Rigid throughout.  Poor flex posture. General transfer comment: Performed x 3 with cues for hand placement and light min A to rise and steady.  Pt with tendancy to rock forward and use momentum.  Poor forward flex posture.  Typical Parkinson's stance.General Gait Details: cues for posture and RW proximity; min A to steady.  Assisted with amb to bathroom then in hallway.  Distance limited by fatigue.  Rigid forward flex posture.  HIGH FALL RISK anteriorly. Pt will need ST Rehab at SNF   Recommendations for follow up therapy are one component of a multi-disciplinary discharge planning process, led by the attending physician.  Recommendations may be updated based on patient status, additional functional criteria and insurance authorization.  Follow Up Recommendations  Skilled nursing-short term rehab (<3 hours/day)     Assistance Recommended at Discharge Intermittent Supervision/Assistance  Patient can return home with the following A little help with walking and/or transfers;A little help with bathing/dressing/bathroom;Assistance with cooking/housework;Assist for transportation;Direct supervision/assist for financial  management;Help with stairs or ramp for entrance   Equipment Recommendations       Recommendations for Other Services       Precautions / Restrictions Precautions Precautions: Fall Precaution Comments: history of syncope (5th admission in last 6 months for this), monitor HR and BP Restrictions Weight Bearing Restrictions: No     Mobility  Bed Mobility Overal bed mobility: Needs Assistance Bed Mobility: Supine to Sit     Supine to sit: Min assist     General bed mobility comments: Increased time, pt using momentum of legs to lift trunk with min A.  Rigid throughout.  Poor flex posture.    Transfers Overall transfer level: Needs assistance Equipment used: Rolling walker (2 wheels) Transfers: Sit to/from Stand Sit to Stand: Min assist   Step pivot transfers: Min assist, +2 safety/equipment       General transfer comment: Performed x 3 with cues for hand placement and light min A to rise and steady.  Pt with tendancy to rock forward and use momentum.  Poor forward flex posture.  Typical Parkinson's stance.    Ambulation/Gait Ambulation/Gait assistance: Min assist Gait Distance (Feet): 32 Feet Assistive device: Rolling walker (2 wheels) Gait Pattern/deviations: Step-to pattern, Decreased stride length, Shuffle, Trunk flexed Gait velocity: Decreased     General Gait Details: cues for posture and RW proximity; min A to steady.  Assisted with amb to bathroom then in hallway.  Distance limited by fatigue.  Rigid forward flex posture.  HIGH FALL RISK anteriorly.   Stairs             Wheelchair Mobility    Modified Rankin (Stroke Patients Only)       Balance  Cognition Arousal/Alertness: Awake/alert Behavior During Therapy: Flat affect Overall Cognitive Status: Within Functional Limits for tasks assessed                                 General Comments: flat affect, little  verbalization but will answer questions.  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        Exercises      General Comments        Pertinent Vitals/Pain Pain Assessment Pain Assessment: No/denies pain    Home Living                          Prior Function            PT Goals (current goals can now be found in the care plan section) Progress towards PT goals: Progressing toward goals    Frequency    Min 2X/week      PT Plan Current plan remains appropriate    Co-evaluation              AM-PAC PT "6 Clicks" Mobility   Outcome Measure  Help needed turning from your back to your side while in a flat bed without using bedrails?: A Little Help needed moving from lying on your back to sitting on the side of a flat bed without using bedrails?: A Little Help needed moving to and from a bed to a chair (including a wheelchair)?: A Little Help needed standing up from a chair using your arms (e.g., wheelchair or bedside chair)?: A Little Help needed to walk in hospital room?: A Lot Help needed climbing 3-5 steps with a railing? : Total 6 Click Score: 15    End of Session Equipment Utilized During Treatment: Gait belt Activity Tolerance: Patient tolerated treatment well Patient left: in chair;with chair alarm set;with call bell/phone within reach Nurse Communication: Mobility status PT Visit Diagnosis: Other abnormalities of gait and mobility (R26.89);Other symptoms and signs involving the nervous system (R29.898)     Time: 15:47 - 16:15 PT Time Calculation (min) (ACUTE ONLY): 28 min  Charges:  $Gait Training: 8-22 mins $Therapeutic Activity: 8-22 mins                     {Dynasti Kerman  PTA Acute  Rehabilitation Services Pager      [phone] Office      [phone]

## 2021-08-21 NOTE — Plan of Care (Signed)
Patient remains on airborne/contact isolation. No acute events overnight.  Problem: Education: Goal: Knowledge of General Education information will improve Description: Including pain rating scale, medication(s)/side effects and non-pharmacologic comfort measures Outcome: Progressing   Problem: Health Behavior/Discharge Planning: Goal: Ability to manage health-related needs will improve Outcome: Progressing   Problem: Clinical Measurements: Goal: Ability to maintain clinical measurements within normal limits will improve Outcome: Progressing Goal: Will remain free from infection Outcome: Progressing Goal: Diagnostic test results will improve Outcome: Progressing Goal: Respiratory complications will improve Outcome: Progressing Goal: Cardiovascular complication will be avoided Outcome: Progressing   Problem: Activity: Goal: Risk for activity intolerance will decrease Outcome: Progressing   Problem: Nutrition: Goal: Adequate nutrition will be maintained Outcome: Progressing   Problem: Coping: Goal: Level of anxiety will decrease Outcome: Progressing   Problem: Elimination: Goal: Will not experience complications related to bowel motility Outcome: Progressing Goal: Will not experience complications related to urinary retention Outcome: Progressing   Problem: Pain Managment: Goal: General experience of comfort will improve Outcome: Progressing   Problem: Safety: Goal: Ability to remain free from injury will improve Outcome: Progressing   Problem: Skin Integrity: Goal: Risk for impaired skin integrity will decrease Outcome: Progressing   Problem: Education: Goal: Knowledge of risk factors and measures for prevention of condition will improve Outcome: Progressing   Problem: Coping: Goal: Psychosocial and spiritual needs will be supported Outcome: Progressing   Problem: Respiratory: Goal: Will maintain a patent airway Outcome: Progressing Goal: Complications  related to the disease process, condition or treatment will be avoided or minimized Outcome: Progressing

## 2021-08-22 LAB — GLUCOSE, CAPILLARY: Glucose-Capillary: 85 mg/dL (ref 70–99)

## 2021-08-22 NOTE — Progress Notes (Signed)
PROGRESS NOTE    Fernando Carr  [id number] DOB: 09-Mar-1941 DOA: 08/11/2021 PCP: [name], MD    Chief Complaint  Patient presents with   Syncope    Syncope     Brief Narrative:  Fernando Carr is a 81 y.o. male with medical history significant for Parkinson's disease, paroxysmal A. fib not on anticoagulation due to frequent falls, recurrent syncope, bipolar disorder type 1, CKD 3B who presented to [facility] ED after briefly losing consciousness at ALF.  Witnessed by nursing staff.  EMS was activated, they witnessed 3 syncopal episodes.  No postictal symptoms.  Patient endorses that he felt very weak prior to passing out.  No chest pain or palpitations.  States he did not have much to drink or eat prior to this event.  He was in his usual state of health prior.  At the time of this interview, he is alert and oriented x 3.  Work up revealed hypovolemia, AKI, and orthostatic hypotension.  Received IV fluids.  He was seen by cardiology.  His syncope thought secondary to orthostatic hypotension likely from autonomic dysfunction in the setting of Parkinson disease.    COVID-19 viral infection positive on 08/11/2021, received 5 days of Molnupiravir to end on 08/17/2021.  Not hypoxic therefore steroids were not started.   Seen by PT OT with recommendation for SNF.  TOC assisting with SNF placement.  Isolation window 10 days to end of 08/21/2021.   Assessment & Plan:   Principal Problem:   Syncope Active Problems:   Depression   Bipolar disorder (HCC)   AF (paroxysmal atrial fibrillation) (HCC)   Neurogenic orthostatic hypotension (HCC)   Dementia due to Parkinson's disease with behavioral disturbance (HCC)   COVID-19   Debility   Anxiety   Autonomic dysfunction   Orthostatic hypotension   #1 recurrent syncope, can due to orthostatic hypotension from autonomic dysfunction in the setting of Parkinson's disease -Patient noted on presentation to be hypokalemic, dehydrated noted to have a  prodrome of weakness prior to losing consciousness. -Orthostatic vitals checked were positive on admission. -Patient hydrated with IV fluids. -Continue home regimen midodrine, TED hose, abdominal binders. -Fall precautions. -Patient seen in consultation by cardiology felt syncopal episode secondary to orthostatic hypotension secondary to autonomic dysfunction. -Patient seen by PT who are recommending SNF.  2.  Autonomic dysfunction in setting of Parkinson's disease with recurrent syncope -Patient was hydrated IV fluids. -Fall precautions. -Continue midodrine, abdominal binder, TED hose.    3.  COVID-19 viral infection -Patient noted not to be hypoxic with sats of 97-100% on room air. -Status post 5-day course of antiviral molnupiravir. -Inflammatory markers have trended down.  4.  AKI on CKD stage IIIb, prerenal secondary to dehydration -Renal function improved with hydration creatinine noted on admission 2.95, trended down currently at 1.40. -Avoid nephrotoxic agents. -Supportive care.  5.  Parkinson's disease -Sinemet, fall precautions.   -Outpatient follow-up.  6.  Hyperlipidemia -Continue simvastatin, fenofibrate.    7.  Depression/chronic anxiety/bipolar disorder -Continue Zoloft, risperidone.   8.  Paroxysmal atrial fibrillation -Currently rate controlled.   -Not a anticoagulation candidate due to history of frequent falls.  9.  Anemia of chronic disease in the setting of CKD -Stable.  10.  Debility -Seen by PT OT who are recommending SNF placement. -Patient likely requires 10 days of isolation for SNF placement which ended on 08/21/2021. -Discontinue airborne/isolation precautions.     DVT prophylaxis: Lovenox Code Status: Full Family Communication: Updated patient.  No family  at bedside. Disposition:   Status is: Inpatient  Remains inpatient appropriate because: Unsafe disposition.     Consultants:  Cardiology: Dr.Nishan 13 2022  Procedures:  CT  head CT C-spine 08/11/2021    Antimicrobials:  IV Rocephin 08/12/2021>>>> 08/14/2021 Molnupiravir 08/13/2021>>>> 08/18/2021    Subjective: In bed.  No abdominal pain.  No chest pain.  Feels better.  Awaiting SNF placement.  No dizziness, no lightheadedness.  No further syncopal episodes noted.  Objective: Vitals:   08/21/21 1443 08/21/21 1951 08/22/21 0401 08/22/21 0449  BP: (!) 185/83 119/63 139/65   Pulse: (!) 51 64 (!) 56   Resp: 14 20 20    Temp: (!) 97.4 F (36.3 C) 97.6 F (36.4 C) 97.8 F (36.6 C)   TempSrc: Oral Oral Oral   SpO2: 97% 97% 96%   Weight:    91.9 kg    Intake/Output Summary (Last 24 hours) at 08/22/2021 1333 Last data filed at 08/21/2021 1615 Gross per 24 hour  Intake 120 ml  Output 500 ml  Net -380 ml    Filed Weights   08/18/21 0500 08/21/21 0500 08/22/21 0449  Weight: 90.6 kg 92.7 kg 91.9 kg    Examination:  General exam: Pill-rolling tremors. Respiratory system: Clear to auscultation bilaterally.  No wheezes, no crackles, no rhonchi.  Normal Rattray effort.  Speaking in full sentences. Cardiovascular system: RRR no murmurs rubs or gallops.  No JVD.  No lower extremity edema.   Gastrointestinal system: Abdomen is soft, nontender, nondistended, positive bowel sounds.  No rebound.  No guarding. Central nervous system: Alert and oriented. No focal neurological deficits. Extremities: Symmetric 5 x 5 power. Skin: No rashes, lesions or ulcers Psychiatry: Judgement and insight appear fair. Mood & affect appropriate.     Data Reviewed: I have personally reviewed following labs and imaging studies  CBC: Recent Labs  Lab 08/18/21 0447  WBC 6.1  HGB 9.9*  HCT 32.4*  MCV 90.3  PLT 183     Basic Metabolic Panel: Recent Labs  Lab 08/18/21 0447 08/20/21 0908  NA 135 136  K 4.6 4.4  CL 105 105  CO2 25 27  GLUCOSE 88 131*  BUN 42* 38*  CREATININE 1.53* 1.40*  CALCIUM 10.0 9.7  MG 2.2  --   PHOS 3.5  --      GFR: Estimated  Creatinine Clearance: 46.2 mL/min (A) (by C-G formula based on SCr of 1.4 mg/dL (H)).  Liver Function Tests: No results for input(s): AST, ALT, ALKPHOS, BILITOT, PROT, ALBUMIN in the last 168 hours.   CBG: Recent Labs  Lab 08/17/21 0334 08/18/21 0005 08/20/21 0505 08/21/21 0502 08/22/21 0448  GLUCAP 78 127* 82 80 85      No results found for this or any previous visit (from the past 240 hour(s)).         Radiology Studies: No results found.      Scheduled Meds:  carbidopa-levodopa  1 tablet Oral TID   enoxaparin (LOVENOX) injection  40 mg Subcutaneous Q24H   fenofibrate  54 mg Oral Daily   mouth rinse  15 mL Mouth Rinse BID   midodrine  10 mg Oral TID WC   risperiDONE  0.5 mg Oral QHS   sertraline  100 mg Oral Daily   simvastatin  10 mg Oral q1800   sodium chloride flush  3 mL Intravenous Q12H   Continuous Infusions:     LOS: 9 days    Time spent: 35 minutes    Irine Seal,  MD [facility]   To contact the attending provider between 7A-7P or the covering provider during after hours 7P-7A, please log into the web site [email or web address] and access using universal [place] password for that web site. If you do not have the password, please call the hospital operator.  08/22/2021, 1:33 PM

## 2021-08-22 NOTE — Plan of Care (Signed)
  Problem: Education: Goal: Knowledge of General Education information will improve Description Including pain rating scale, medication(s)/side effects and non-pharmacologic comfort measures Outcome: Progressing   

## 2021-08-22 NOTE — TOC Progression Note (Signed)
Transition of Care [facility]) - Progression Note    Patient Details  Name: Fernando Carr MRN: [id number] Date of Birth: 1940-08-09  Transition of Care [facility]) CM/SW Contact  Ross Ludwig, [place] Phone Number: 08/22/2021, 11:08 AM  Clinical Narrative:    Updated clinicals sent to SNFs awaiting for bed offers.        Expected Discharge Plan and Services                                                 Social Determinants of Health (SDOH) Interventions    Readmission Risk Interventions No flowsheet data found.

## 2021-08-23 LAB — GLUCOSE, CAPILLARY: Glucose-Capillary: 79 mg/dL (ref 70–99)

## 2021-08-23 NOTE — Progress Notes (Signed)
Occupational Therapy Treatment Patient Details Name: Fernando Carr MRN: [id number] DOB: [birth year] Today's Date: 08/23/2021   History of present illness 81 y.o. male presents to [facility] ED on 12/21/2022with worsening confusion, refusing medications, poor PO intake, and recent fall. Pt with orthostatic hypotension/syncope from autonomic dysfunction associated with Parkinsons and COVID. PMH includes bipolar disorder, MDD, HLD, AAA, afib, parkinson's disease.   OT comments  Chart reviewed to date, pt greeted in bed, agreeable to tx session. Pt is oriented x4, however flat affect noted and pt reporting " I feel like I have lost my brains, I cant figure out how to get into my bank account on my phone". Assisted pt with task at end of tx session with pt able to access banking app, however requiring his card to log into his account (which he reported is in his wallet, not at the hospital). Tx session targeted progressing functional endurance and mobility for safe ADL completion. On this date pt with noted improved participation throughout. CGA required for functional mobility with RW, MIN A for toileting/clothing management, supervision for standing grooming tasks at sink level. Pt is left as received, NAD, all needs met. Vss throughout. Discharge recommendation remains appropriate, OT will continue to follow acutely.    Recommendations for follow up therapy are one component of a multi-disciplinary discharge planning process, led by the attending physician.  Recommendations may be updated based on patient status, additional functional criteria and insurance authorization.    Follow Up Recommendations  Skilled nursing-short term rehab (<3 hours/day)    Assistance Recommended at Discharge Frequent or constant Supervision/Assistance  Patient can return home with the following  A little help with walking and/or transfers;A little help with bathing/dressing/bathroom   Equipment Recommendations  None recommended by  OT;Other (comment) (per next venue of care)    Recommendations for Other Services      Precautions / Restrictions Precautions Precautions: Fall Precaution Comments: history of syncope (5th admission in last 6 months for this), monitor HR and BP Restrictions Weight Bearing Restrictions: No       Mobility Bed Mobility   Bed Mobility: Supine to Sit, Sit to Supine     Supine to sit: Supervision, HOB elevated Sit to supine: Supervision, HOB elevated        Transfers   Equipment used: Rolling walker (2 wheels) Transfers: Sit to/from Stand Sit to Stand: Min guard                 Balance Overall balance assessment: Needs assistance Sitting-balance support: Feet supported Sitting balance-Leahy Scale: Fair     Standing balance support: During functional activity, Reliant on assistive device for balance Standing balance-Leahy Scale: Fair Standing balance comment: reilant on AD while ambulating, stand at sink without BUE support                           ADL either performed or assessed with clinical judgement   ADL Overall ADL's : Needs assistance/impaired     Grooming: Min guard;Standing;Wash/dry hands;Wash/dry face Grooming Details (indicate cue type and reason): standing at sink         Upper Body Dressing : Set up;Sitting       Toilet Transfer: Min guard;Ambulation;Rolling walker (2 wheels)   Toileting- Clothing Manipulation and Hygiene: Minimal assistance;Sit to/from stand Toileting - Clothing Manipulation Details (indicate cue type and reason): for peri care     Functional mobility during ADLs: Supervision/safety;Min guard  Extremity/Trunk Assessment              Vision       Perception     Praxis      Cognition Arousal/Alertness: Awake/alert Behavior During Therapy: Flat affect Overall Cognitive Status: Within Functional Limits for tasks assessed                                 General Comments: flat  affect, increased noted interaction and answering questions on this date. Alert and oriented x4.        Exercises Other Exercises Other Exercises: educated re: role of OT, role of rehab, discharge recommendations, safe ADL completion with use of AE/compensatory techniques    Shoulder Instructions       General Comments Vss throughout tx session, asymptomatic for orthostatic hypotension    Pertinent Vitals/ Pain       Pain Assessment Pain Assessment: No/denies pain  Home Living                                          Prior Functioning/Environment              Frequency  Min 2X/week        Progress Toward Goals  OT Goals(current goals can now be found in the care plan section)  Progress towards OT goals: Progressing toward goals  Acute Rehab OT Goals Patient Stated Goal: walk to the bathroom OT Goal Formulation: With patient Time For Goal Achievement: 09/06/21 Potential to Achieve Goals: Good  Plan Discharge plan remains appropriate    Co-evaluation                 AM-PAC OT "6 Clicks" Daily Activity     Outcome Measure   Help from another person eating meals?: A Little Help from another person taking care of personal grooming?: None Help from another person toileting, which includes using toliet, bedpan, or urinal?: A Little Help from another person bathing (including washing, rinsing, drying)?: A Little Help from another person to put on and taking off regular upper body clothing?: A Little Help from another person to put on and taking off regular lower body clothing?: A Little 6 Click Score: 19    End of Session Equipment Utilized During Treatment: Rolling walker (2 wheels);Gait belt  OT Visit Diagnosis: History of falling (Z91.81)   Activity Tolerance Patient tolerated treatment well   Patient Left in bed;with call bell/phone within reach;with bed alarm set   Nurse Communication Mobility status        Time:  1050-1120 OT Time Calculation (min): 30 min  Charges: OT General Charges $OT Visit: 1 Visit OT Treatments $Self Care/Home Management : 23-37 mins  Shanon Payor, OTD OTR/L  08/23/21, 12:33 PM

## 2021-08-23 NOTE — Progress Notes (Signed)
Physical Therapy Treatment Patient Details Name: BLANDON OFFERDAHL MRN: [id number] DOB: 1940-11-08 Today's Date: 08/23/2021   History of Present Illness 81 y.o. male presents to [facility] ED on 12/21/2022with worsening confusion, refusing medications, poor PO intake, and recent fall. Pt with orthostatic hypotension/syncope from autonomic dysfunction associated with Parkinsons and COVID. PMH includes bipolar disorder, MDD, HLD, AAA, afib, parkinson's disease.    PT Comments    Pt tolerated increased ambulation distance of 48' with RW. He requires close supervision and verbal cues to maintain safe distance from RW and to correct flexed posture. He remains a high fall risk. Supervision for mobility recommended.     Recommendations for follow up therapy are one component of a multi-disciplinary discharge planning process, led by the attending physician.  Recommendations may be updated based on patient status, additional functional criteria and insurance authorization.  Follow Up Recommendations  Skilled nursing-short term rehab (<3 hours/day)     Assistance Recommended at Discharge Intermittent Supervision/Assistance  Patient can return home with the following A little help with walking and/or transfers;A little help with bathing/dressing/bathroom;Assistance with cooking/housework;Direct supervision/assist for medications management;Assist for transportation;Help with stairs or ramp for entrance   Equipment Recommendations       Recommendations for Other Services       Precautions / Restrictions Precautions Precautions: Fall Precaution Comments: history of syncope (5th admission in last 6 months for this), monitor HR and BP Restrictions Weight Bearing Restrictions: No     Mobility  Bed Mobility   Bed Mobility: Supine to Sit, Sit to Supine     Supine to sit: Supervision, HOB elevated Sit to supine: Supervision, HOB elevated   General bed mobility comments: increased time, pt uses momentum  of legs to lift trunk    Transfers   Equipment used: Rolling walker (2 wheels) Transfers: Sit to/from Stand Sit to Stand: Min guard, From elevated surface           General transfer comment: Verbal cues for hand placement with sit to stand x 3 trials from elevated bed and then from commode    Ambulation/Gait Ambulation/Gait assistance: Min guard Gait Distance (Feet): 80 Feet Assistive device: Rolling walker (2 wheels) Gait Pattern/deviations: Decreased stride length, Shuffle, Trunk flexed, Step-through pattern Gait velocity: Decreased     General Gait Details: VCs for posture and for proximity to RW, pt able to partially correct flexed posture but only maintained it briefly, High risk for fall anteriorly.   Stairs             Wheelchair Mobility    Modified Rankin (Stroke Patients Only)       Balance Overall balance assessment: Needs assistance Sitting-balance support: Feet supported Sitting balance-Leahy Scale: Fair     Standing balance support: During functional activity, Reliant on assistive device for balance Standing balance-Leahy Scale: Fair Standing balance comment: reilant on AD while ambulating, stand at sink without BUE support                            Cognition Arousal/Alertness: Awake/alert Behavior During Therapy: Flat affect Overall Cognitive Status: Within Functional Limits for tasks assessed                                 General Comments: flat affect, Alert and oriented x4.        Exercises      General Comments General comments (skin  integrity, edema, etc.): Vss throughout tx session, asymptomatic for orthostatic hypotension      Pertinent Vitals/Pain Pain Assessment Pain Assessment: No/denies pain    Home Living                          Prior Function            PT Goals (current goals can now be found in the care plan section) Acute Rehab PT Goals Patient Stated Goal: to return  to prior level of function PT Goal Formulation: With patient Time For Goal Achievement: 08/07/21 Potential to Achieve Goals: Fair Progress towards PT goals: Progressing toward goals    Frequency    Min 2X/week      PT Plan Current plan remains appropriate    Co-evaluation              AM-PAC PT "6 Clicks" Mobility   Outcome Measure  Help needed turning from your back to your side while in a flat bed without using bedrails?: A Little Help needed moving from lying on your back to sitting on the side of a flat bed without using bedrails?: A Little Help needed moving to and from a bed to a chair (including a wheelchair)?: A Little Help needed standing up from a chair using your arms (e.g., wheelchair or bedside chair)?: A Little Help needed to walk in hospital room?: A Little Help needed climbing 3-5 steps with a railing? : A Lot 6 Click Score: 17    End of Session Equipment Utilized During Treatment: Gait belt Activity Tolerance: Patient tolerated treatment well Patient left: in bed;with bed alarm set;with call bell/phone within reach Nurse Communication: Mobility status PT Visit Diagnosis: Other abnormalities of gait and mobility (R26.89);Other symptoms and signs involving the nervous system (R29.898)     Time: 3295-1884 PT Time Calculation (min) (ACUTE ONLY): 20 min  Charges:  $Gait Training: 8-22 mins                    Blondell Reveal Kistler PT 08/23/2021  Acute Rehabilitation Services Pager [phone] Office [phone]

## 2021-08-23 NOTE — TOC Progression Note (Signed)
Transition of Care [facility]) - Progression Note    Patient Details  Name: Fernando Carr MRN: [id number] Date of Birth: 1941/07/06  Transition of Care [facility]) CM/SW Contact  Ross Ludwig, [place] Phone Number: 08/23/2021, 7:21 PM  Clinical Narrative:    CSW provided bed offers to patient and he chose [facility].  Patient is regular [facility] Medicare, and SNF will have to get auth.  CSW spoke to [place] and they will start insurance authorization.  CSW updated attending physician, CSW to continue to follow patient's progress throughout discharge planning.        Expected Discharge Plan and Services                                                 Social Determinants of Health (SDOH) Interventions    Readmission Risk Interventions No flowsheet data found.

## 2021-08-23 NOTE — Progress Notes (Signed)
PROGRESS NOTE    Fernando Carr  OZD:[id number] DOB: 09/06/1940 DOA: 08/11/2021 PCP: [name], MD    Chief Complaint  Patient presents with   Syncope    Syncope     Brief Narrative:  Fernando Carr is a 81 y.o. male with medical history significant for Parkinson's disease, paroxysmal A. fib not on anticoagulation due to frequent falls, recurrent syncope, bipolar disorder type 1, CKD 3B who presented to [facility] ED after briefly losing consciousness at ALF.  Witnessed by nursing staff.  EMS was activated, they witnessed 3 syncopal episodes.  No postictal symptoms.  Patient endorses that he felt very weak prior to passing out.  No chest pain or palpitations.  States he did not have much to drink or eat prior to this event.  He was in his usual state of health prior.  At the time of this interview, he is alert and oriented x 3.  Work up revealed hypovolemia, AKI, and orthostatic hypotension.  Received IV fluids.  He was seen by cardiology.  His syncope thought secondary to orthostatic hypotension likely from autonomic dysfunction in the setting of Parkinson disease.    COVID-19 viral infection positive on 08/11/2021, received 5 days of Molnupiravir to end on 08/17/2021.  Not hypoxic therefore steroids were not started.   Seen by PT OT with recommendation for SNF.  TOC assisting with SNF placement.  Isolation window 10 days to end of 08/21/2021.   Assessment & Plan:   Principal Problem:   Syncope Active Problems:   Depression   Bipolar disorder (HCC)   AF (paroxysmal atrial fibrillation) (HCC)   Neurogenic orthostatic hypotension (HCC)   Dementia due to Parkinson's disease with behavioral disturbance (HCC)   COVID-19   Debility   Anxiety   Autonomic dysfunction   Orthostatic hypotension   #1 recurrent syncope, can due to orthostatic hypotension from autonomic dysfunction in the setting of Parkinson's disease -Patient noted on presentation to be hypokalemic, dehydrated noted to have a  prodrome of weakness prior to losing consciousness. -Orthostatic vitals checked were positive on admission. -Patient hydrated with IV fluids. -Continue home regimen midodrine, TED hose, abdominal binders. -Fall precautions. -Patient seen in consultation by cardiology felt syncopal episode secondary to orthostatic hypotension secondary to autonomic dysfunction. -Patient seen by PT who are recommending SNF.  2.  Autonomic dysfunction in setting of Parkinson's disease with recurrent syncope -Patient was hydrated IV fluids. -Fall precautions. -Continue midodrine, abdominal binder, TED hose.    3.  COVID-19 viral infection -Patient noted not to be hypoxic with sats of 99% on room air. -Status post 5-day course of antiviral molnupiravir. -Inflammatory markers have trended down. -Has completed isolation and airborne precautions have been discontinued.  4.  AKI on CKD stage IIIb, prerenal secondary to dehydration -Renal function improved with hydration creatinine noted on admission 2.95, trended down currently at 1.40. -Avoid nephrotoxic agents. -Supportive care.  5.  Parkinson's disease -Sinemet, fall precautions.   -Outpatient follow-up.  6.  Hyperlipidemia -Continue simvastatin, fenofibrate.    7.  Depression/chronic anxiety/bipolar disorder -Zoloft, risperidone.  8.  Paroxysmal atrial fibrillation -Rate controlled.   -Not anticoagulation candidate due to history of frequent falls.   9.  Anemia of chronic disease in the setting of CKD -Stable.  10.  Debility -Seen by PT OT who are recommending SNF placement. -Patient likely requires 10 days of isolation for SNF placement which ended on 08/21/2021. -Airborne precautions have been discontinued.   -Awaiting SNF placement.   DVT prophylaxis:  Lovenox Code Status: Full Family Communication: Updated patient.  No family at bedside. Disposition:   Status is: Inpatient  Remains inpatient appropriate because: Unsafe  disposition.     Consultants:  Cardiology: Dr.Nishan 13 2022  Procedures:  CT head CT C-spine 08/11/2021    Antimicrobials:  IV Rocephin 08/12/2021>>>> 08/14/2021 Molnupiravir 08/13/2021>>>> 08/18/2021    Subjective: No chest pain.  No shortness of breath.  No dizziness, no syncopal episodes.  Awaiting SNF placement.    Objective: Vitals:   08/22/21 1431 08/22/21 1956 08/23/21 0452 08/23/21 0500  BP: (!) 152/73 128/67 (!) 146/73   Pulse: 64 (!) 59 (!) 50   Resp: 19 18 16    Temp: (!) 97.5 F (36.4 C) 97.7 F (36.5 C) 98.3 F (36.8 C)   TempSrc: Oral Oral Oral   SpO2: 100% 95% 98%   Weight:    87.3 kg    Intake/Output Summary (Last 24 hours) at 08/23/2021 1239 Last data filed at 08/23/2021 0900 Gross per 24 hour  Intake 720 ml  Output 2450 ml  Net -1730 ml    Filed Weights   08/21/21 0500 08/22/21 0449 08/23/21 0500  Weight: 92.7 kg 91.9 kg 87.3 kg    Examination:  General exam: Pill-rolling tremors. Respiratory system: CTA B.  No wheezes, no crackles, no rhonchi.  Normal respiratory effort.  Speaking in full sentences.   Cardiovascular system: Regular rate and rhythm no murmurs rubs or gallops.  No JVD.  No lower extremity edema.  Gastrointestinal system: Abdomen is soft, nontender, nondistended, positive bowel sounds.  No rebound.  No guarding.   Central nervous system: Alert and oriented. No focal neurological deficits. Extremities: Symmetric 5 x 5 power. Skin: No rashes, lesions or ulcers Psychiatry: Judgement and insight appear fair. Mood & affect appropriate.     Data Reviewed: I have personally reviewed following labs and imaging studies  CBC: Recent Labs  Lab 08/18/21 0447  WBC 6.1  HGB 9.9*  HCT 32.4*  MCV 90.3  PLT 183     Basic Metabolic Panel: Recent Labs  Lab 08/18/21 0447 08/20/21 0908  NA 135 136  K 4.6 4.4  CL 105 105  CO2 25 27  GLUCOSE 88 131*  BUN 42* 38*  CREATININE 1.53* 1.40*  CALCIUM 10.0 9.7  MG 2.2  --   PHOS  3.5  --      GFR: Estimated Creatinine Clearance: 46.2 mL/min (A) (by C-G formula based on SCr of 1.4 mg/dL (H)).  Liver Function Tests: No results for input(s): AST, ALT, ALKPHOS, BILITOT, PROT, ALBUMIN in the last 168 hours.   CBG: Recent Labs  Lab 08/18/21 0005 08/20/21 0505 08/21/21 0502 08/22/21 0448 08/23/21 0452  GLUCAP 127* 82 80 85 79      No results found for this or any previous visit (from the past 240 hour(s)).         Radiology Studies: No results found.      Scheduled Meds:  carbidopa-levodopa  1 tablet Oral TID   enoxaparin (LOVENOX) injection  40 mg Subcutaneous Q24H   fenofibrate  54 mg Oral Daily   mouth rinse  15 mL Mouth Rinse BID   midodrine  10 mg Oral TID WC   risperiDONE  0.5 mg Oral QHS   sertraline  100 mg Oral Daily   simvastatin  10 mg Oral q1800   sodium chloride flush  3 mL Intravenous Q12H   Continuous Infusions:     LOS: 10 days    Time  spent: 35 minutes    Irine Seal, MD [facility]   To contact the attending provider between 7A-7P or the covering provider during after hours 7P-7A, please log into the web site [email or web address] and access using universal [place] password for that web site. If you do not have the password, please call the hospital operator.  08/23/2021, 12:39 PM

## 2021-08-23 NOTE — Care Management Important Message (Signed)
Important Message  Patient Details IM Letter placed in Patients room. Name: Fernando Carr MRN: [id number] Date of Birth: 1941-07-24   Medicare Important Message Given:  Yes     Kerin Salen 08/23/2021, 10:48 AM

## 2021-08-24 LAB — GLUCOSE, CAPILLARY: Glucose-Capillary: 95 mg/dL (ref 70–99)

## 2021-08-24 NOTE — Progress Notes (Signed)
PROGRESS NOTE    Fernando Carr  NTI:[id number] DOB: 1941/02/27 DOA: 08/11/2021 PCP: [name], MD    Chief Complaint  Patient presents with   Syncope    Syncope     Brief Narrative:  Fernando Carr is a 81 y.o. male with medical history significant for Parkinson's disease, paroxysmal A. fib not on anticoagulation due to frequent falls, recurrent syncope, bipolar disorder type 1, CKD 3B who presented to [facility] ED after briefly losing consciousness at ALF.  Witnessed by nursing staff.  EMS was activated, they witnessed 3 syncopal episodes.  No postictal symptoms.  Patient endorses that he felt very weak prior to passing out.  No chest pain or palpitations.  States he did not have much to drink or eat prior to this event.  He was in his usual state of health prior.  At the time of this interview, he is alert and oriented x 3.  Work up revealed hypovolemia, AKI, and orthostatic hypotension.  Received IV fluids.  He was seen by cardiology.  His syncope thought secondary to orthostatic hypotension likely from autonomic dysfunction in the setting of Parkinson disease.    COVID-19 viral infection positive on 08/11/2021, received 5 days of Molnupiravir to end on 08/17/2021.  Not hypoxic therefore steroids were not started.   Seen by PT OT with recommendation for SNF.  TOC assisting with SNF placement.  Isolation window 10 days to end of 08/21/2021.   Assessment & Plan:   Principal Problem:   Syncope Active Problems:   Depression   Bipolar disorder (HCC)   AF (paroxysmal atrial fibrillation) (HCC)   Neurogenic orthostatic hypotension (HCC)   Dementia due to Parkinson's disease with behavioral disturbance (HCC)   COVID-19   Debility   Anxiety   Autonomic dysfunction   Orthostatic hypotension   #1 recurrent syncope, can due to orthostatic hypotension from autonomic dysfunction in the setting of Parkinson's disease -Patient noted on presentation to be hypokalemic, dehydrated noted to have a  prodrome of weakness prior to losing consciousness. -Orthostatic vitals checked were positive on admission. -Patient hydrated with IV fluids. -Continue home regimen midodrine, TED hose, abdominal binders. -Fall precautions. -Patient seen in consultation by cardiology felt syncopal episode secondary to orthostatic hypotension secondary to autonomic dysfunction. -Patient seen by PT who are recommending SNF.  2.  Autonomic dysfunction in setting of Parkinson's disease with recurrent syncope -Patient was hydrated IV fluids. -Fall precautions. -Continue TED hose, abdominal binder, midodrine.   3.  COVID-19 viral infection -Patient noted not to be hypoxic with sats of 99% on room air. -Status post 5-day course of antiviral molnupiravir. -Inflammatory markers have trended down. -Has completed isolation and airborne precautions have been discontinued.  4.  AKI on CKD stage IIIb, prerenal secondary to dehydration -Renal function improved with hydration creatinine noted on admission 2.95, trended down and at 1.40.   -Avoid nephrotoxic agents. -Supportive care.  5.  Parkinson's disease -Sinemet, fall precautions.   -Outpatient follow-up.  6.  Hyperlipidemia -Continue simvastatin, fenofibrate.    7.  Depression/chronic anxiety/bipolar disorder -Continue risperidone, Zoloft.   8.  Paroxysmal atrial fibrillation -Rate controlled.   -Not anticoagulation candidate due to history of frequent falls.   9.  Anemia of chronic disease in the setting of CKD -Stable.  10.  Debility -Seen by PT OT who are recommending SNF placement. -Patient likely requires 10 days of isolation for SNF placement which ended on 08/21/2021. -Airborne precautions have been discontinued.   -Awaiting SNF placement.  DVT prophylaxis: Lovenox Code Status: Full Family Communication: Updated patient.  No family at bedside. Disposition: Medically stable.  Awaiting SNF placement.  Status is: Inpatient  Remains  inpatient appropriate because: Unsafe disposition.     Consultants:  Cardiology: Dr.Nishan 13 [id number]  Procedures:  CT head CT C-spine 08/11/2021    Antimicrobials:  IV Rocephin 08/12/2021>>>> 08/14/2021 Molnupiravir 08/13/2021>>>> 08/18/2021    Subjective: Laying in bed sleeping but easily arousable.  No further syncopal episodes.  No chest pain.  No shortness of breath.  Awaiting SNF placement.   Objective: Vitals:   08/23/21 1243 08/23/21 2031 08/24/21 0500 08/24/21 0518  BP: (!) 187/76 (!) 141/90 (!) 153/95   Pulse: 60 63 65   Resp: 18 20 18    Temp: 98.3 F (36.8 C) 98 F (36.7 C) 98.3 F (36.8 C)   TempSrc:      SpO2: 99% 95% 98%   Weight:   95.1 kg 92 kg    Intake/Output Summary (Last 24 hours) at 08/24/2021 1706 Last data filed at 08/24/2021 0959 Gross per 24 hour  Intake 712 ml  Output --  Net 712 ml    Filed Weights   08/23/21 0500 08/24/21 0500 08/24/21 0518  Weight: 87.3 kg 95.1 kg 92 kg    Examination:  General exam: Pill-rolling tremors. Respiratory system: Clear to auscultation bilaterally, no wheezes, no crackles, no rhonchi.  Normal respiratory effort.  Speaking in full sentences. Cardiovascular system: RRR no murmurs rubs or gallops.  No JVD.  No lower extremity edema.  Gastrointestinal system: Abdomen soft, nontender, nondistended, positive bowel sounds.  No rebound.  No guarding.   Central nervous system: Alert and oriented. No focal neurological deficits. Extremities: Symmetric 5 x 5 power. Skin: No rashes, lesions or ulcers Psychiatry: Judgement and insight appear fair. Mood & affect appropriate.     Data Reviewed: I have personally reviewed following labs and imaging studies  CBC: Recent Labs  Lab 08/18/21 0447  WBC 6.1  HGB 9.9*  HCT 32.4*  MCV 90.3  PLT 183     Basic Metabolic Panel: Recent Labs  Lab 08/18/21 0447 08/20/21 0908  NA 135 136  K 4.6 4.4  CL 105 105  CO2 25 27  GLUCOSE 88 131*  BUN 42* 38*  CREATININE  1.53* 1.40*  CALCIUM 10.0 9.7  MG 2.2  --   PHOS 3.5  --      GFR: Estimated Creatinine Clearance: 46.2 mL/min (A) (by C-G formula based on SCr of 1.4 mg/dL (H)).  Liver Function Tests: No results for input(s): AST, ALT, ALKPHOS, BILITOT, PROT, ALBUMIN in the last 168 hours.   CBG: Recent Labs  Lab 08/20/21 0505 08/21/21 0502 08/22/21 0448 08/23/21 0452 08/24/21 0433  GLUCAP 82 80 85 79 95      No results found for this or any previous visit (from the past 240 hour(s)).         Radiology Studies: No results found.      Scheduled Meds:  carbidopa-levodopa  1 tablet Oral TID   enoxaparin (LOVENOX) injection  40 mg Subcutaneous Q24H   fenofibrate  54 mg Oral Daily   mouth rinse  15 mL Mouth Rinse BID   midodrine  10 mg Oral TID WC   risperiDONE  0.5 mg Oral QHS   sertraline  100 mg Oral Daily   simvastatin  10 mg Oral q1800   sodium chloride flush  3 mL Intravenous Q12H   Continuous Infusions:     LOS:  11 days    Time spent: 35 minutes    Irine Seal, MD [facility]   To contact the attending provider between 7A-7P or the covering provider during after hours 7P-7A, please log into the web site [email or web address] and access using universal [place] password for that web site. If you do not have the password, please call the hospital operator.  08/24/2021, 5:06 PM

## 2021-08-24 NOTE — TOC Progression Note (Signed)
Transition of Care [facility]) - Progression Note    Patient Details  Name: Fernando Carr MRN: [id number] Date of Birth: 06-Oct-1940  Transition of Care [facility]) CM/SW Contact  Ross Ludwig,  Phone Number: 08/24/2021, 7:20 PM  Clinical Narrative:     CSW received a phone call from [name], [phone], patient's community case manager and requested an updated on discharge planning.  CSW informed her that the plan is for patient to go to [facility] for short term rehab, insurance Josem Kaufmann is still pending.  Anderson Malta stated that patient is from [facility], but is in the process of moving to an ALF.  Per Anderson Malta, patient was supposed to move to [facility] ALF, however he lost the bed because he was admitted to the hospital.  Per Anderson Malta, patient will have to be reassessed after his rehab to see if he is still able to go to [facility] ALF.  CSW updated Claiborne Billings at [facility] to contact [place] once patient is ready for discharge.  Per Anderson Malta, patient's things need to be removed by January 31 at [facility], or else he gets build for another month.  CSW updated [facility] admissions worker.  Per [facility] patient's insurance Josem Kaufmann is still pending.  CSW to continue follow patient's progress throughout discharge planning.      Expected Discharge Plan and Services                                                 Social Determinants of Health (SDOH) Interventions    Readmission Risk Interventions No flowsheet data found.

## 2021-08-24 NOTE — Plan of Care (Signed)
No acute events overnight. Problem: Education: Goal: Knowledge of General Education information will improve Description: Including pain rating scale, medication(s)/side effects and non-pharmacologic comfort measures Outcome: Progressing   Problem: Health Behavior/Discharge Planning: Goal: Ability to manage health-related needs will improve Outcome: Progressing   Problem: Clinical Measurements: Goal: Ability to maintain clinical measurements within normal limits will improve Outcome: Progressing Goal: Will remain free from infection Outcome: Progressing Goal: Diagnostic test results will improve Outcome: Progressing Goal: Respiratory complications will improve Outcome: Progressing Goal: Cardiovascular complication will be avoided Outcome: Progressing   Problem: Activity: Goal: Risk for activity intolerance will decrease Outcome: Progressing   Problem: Nutrition: Goal: Adequate nutrition will be maintained Outcome: Progressing   Problem: Coping: Goal: Level of anxiety will decrease Outcome: Progressing   Problem: Elimination: Goal: Will not experience complications related to bowel motility Outcome: Progressing Goal: Will not experience complications related to urinary retention Outcome: Progressing   Problem: Pain Managment: Goal: General experience of comfort will improve Outcome: Progressing   Problem: Safety: Goal: Ability to remain free from injury will improve Outcome: Progressing   Problem: Skin Integrity: Goal: Risk for impaired skin integrity will decrease Outcome: Progressing   Problem: Education: Goal: Knowledge of risk factors and measures for prevention of condition will improve Outcome: Progressing   Problem: Coping: Goal: Psychosocial and spiritual needs will be supported Outcome: Progressing   Problem: Respiratory: Goal: Will maintain a patent airway Outcome: Progressing Goal: Complications related to the disease process, condition or  treatment will be avoided or minimized Outcome: Progressing   

## 2021-08-25 DIAGNOSIS — R531 Weakness: Secondary | ICD-10-CM | POA: Diagnosis not present

## 2021-08-25 DIAGNOSIS — M6281 Muscle weakness (generalized): Secondary | ICD-10-CM | POA: Diagnosis not present

## 2021-08-25 DIAGNOSIS — G909 Disorder of the autonomic nervous system, unspecified: Secondary | ICD-10-CM | POA: Diagnosis not present

## 2021-08-25 DIAGNOSIS — I480 Paroxysmal atrial fibrillation: Secondary | ICD-10-CM | POA: Diagnosis not present

## 2021-08-25 DIAGNOSIS — R2689 Other abnormalities of gait and mobility: Secondary | ICD-10-CM | POA: Diagnosis not present

## 2021-08-25 DIAGNOSIS — F419 Anxiety disorder, unspecified: Secondary | ICD-10-CM | POA: Diagnosis not present

## 2021-08-25 DIAGNOSIS — M2550 Pain in unspecified joint: Secondary | ICD-10-CM | POA: Diagnosis not present

## 2021-08-25 DIAGNOSIS — G903 Multi-system degeneration of the autonomic nervous system: Secondary | ICD-10-CM | POA: Diagnosis not present

## 2021-08-25 DIAGNOSIS — I9589 Other hypotension: Secondary | ICD-10-CM | POA: Diagnosis not present

## 2021-08-25 DIAGNOSIS — I951 Orthostatic hypotension: Secondary | ICD-10-CM | POA: Diagnosis not present

## 2021-08-25 DIAGNOSIS — L89152 Pressure ulcer of sacral region, stage 2: Secondary | ICD-10-CM | POA: Diagnosis not present

## 2021-08-25 DIAGNOSIS — F3189 Other bipolar disorder: Secondary | ICD-10-CM | POA: Diagnosis not present

## 2021-08-25 DIAGNOSIS — F319 Bipolar disorder, unspecified: Secondary | ICD-10-CM | POA: Diagnosis not present

## 2021-08-25 DIAGNOSIS — Z7401 Bed confinement status: Secondary | ICD-10-CM | POA: Diagnosis not present

## 2021-08-25 DIAGNOSIS — R55 Syncope and collapse: Secondary | ICD-10-CM | POA: Diagnosis not present

## 2021-08-25 DIAGNOSIS — F332 Major depressive disorder, recurrent severe without psychotic features: Secondary | ICD-10-CM | POA: Diagnosis not present

## 2021-08-25 DIAGNOSIS — R296 Repeated falls: Secondary | ICD-10-CM | POA: Diagnosis not present

## 2021-08-25 DIAGNOSIS — L89896 Pressure-induced deep tissue damage of other site: Secondary | ICD-10-CM | POA: Diagnosis not present

## 2021-08-25 DIAGNOSIS — G20 Parkinson's disease: Secondary | ICD-10-CM | POA: Diagnosis not present

## 2021-08-25 LAB — GLUCOSE, CAPILLARY: Glucose-Capillary: 114 mg/dL — ABNORMAL HIGH (ref 70–99)

## 2021-08-25 MED ORDER — ACETAMINOPHEN 325 MG PO TABS: 650.0000 mg | ORAL_TABLET | Freq: Four times a day (QID) | ORAL | Status: AC | PRN

## 2021-08-25 MED ORDER — MIDODRINE HCL 10 MG PO TABS: 10.0000 mg | ORAL_TABLET | Freq: Three times a day (TID) | ORAL | 2 refills | Status: AC

## 2021-08-25 NOTE — Discharge Summary (Signed)
Physician Discharge Summary  Fernando Carr HER:[id number] DOB: 24-Oct-1940 DOA: 08/11/2021  PCP: Fernando Low, MD  Admit date: 08/11/2021 Discharge date: 08/25/2021  Admitted From: Home Disposition: Skilled nursing facility Recommendations for Outpatient Follow-up:  Follow up with PCP in 1-2 weeks Please obtain BMP/CBC in one week  Home Health:none Equipment/Devices:none Discharge Condition:stable  CODE STATUS:full Diet recommendation: cardiac Brief/Interim Summary:Fernando Carr is a 81 y.o. male with medical history significant for Parkinson's disease, paroxysmal A. fib not on anticoagulation due to frequent falls, recurrent syncope, bipolar disorder type 1, CKD 3B who presented to [facility] ED after briefly losing consciousness at ALF.  Witnessed by nursing staff.  EMS was activated, they witnessed 3 syncopal episodes.  No postictal symptoms.  Patient endorses that he felt very weak prior to passing out.  No chest pain or palpitations.  States he did not have much to drink or eat prior to this event.  He was in his usual state of health prior.  At the time of this interview, he is alert and oriented x 3.  Work up revealed hypovolemia, AKI, and orthostatic hypotension.  Received IV fluids.  He was seen by cardiology.  His syncope thought secondary to orthostatic hypotension likely from autonomic dysfunction in the setting of Parkinson disease.    COVID-19 viral infection positive on 08/11/2021, received 5 days of Molnupiravir to end on 08/17/2021.  Not hypoxic therefore steroids were not started.   Seen by PT OT with recommendation for SNF.  TOC assisting with SNF placement.  Isolation window 10 days to end of 08/21/2021   Discharge Diagnoses:  Principal Problem:   Syncope Active Problems:   Depression   Bipolar disorder (HCC)   AF (paroxysmal atrial fibrillation) (HCC)   Neurogenic orthostatic hypotension (HCC)   Dementia due to Parkinson's disease with behavioral disturbance (HCC)   COVID-19    Debility   Anxiety   Autonomic dysfunction   Orthostatic hypotension  #1 recurrent syncope, due to orthostatic hypotension from autonomic dysfunction in the setting of Parkinson's disease -Patient noted on presentation to be hypokalemic, dehydrated noted to have a prodrome of weakness prior to losing consciousness. -Orthostatic vitals checked were positive on admission. -midodrine dose increased to 10 mg tid -Patient hydrated with IV fluids. -Continue home regimen midodrine, TED hose, abdominal binders. -Fall precautions. -Patient seen in consultation by cardiology felt syncopal episode secondary to orthostatic hypotension secondary to autonomic dysfunction. -Patient seen by PT who are recommending SNF.  2.  Autonomic dysfunction in setting of Parkinson's disease with recurrent syncope -Patient was hydrated IV fluids. -Fall precautions. -Continue TED hose, abdominal binder, midodrine.   3.  COVID-19 viral infection -Patient noted not to be hypoxic with sats of 99% on room air. -Status post 5-day course of antiviral molnupiravir. -Inflammatory markers have trended down. -Has completed isolation and airborne precautions have been discontinued.  4.  AKI on CKD stage IIIb, prerenal secondary to dehydration -Renal function improved with hydration creatinine noted on admission 2.95, trended down and at 1.40.   -Avoid nephrotoxic agents. -Supportive care.  5.  Parkinson's disease -Sinemet, fall precautions.    6.  Hyperlipidemia -Continue simvastatin, fenofibrate.    7.  Depression/chronic anxiety/bipolar disorder -Continue risperidone, Zoloft.     8.  Paroxysmal atrial fibrillation -Rate controlled.   -Not anticoagulation candidate due to history of frequent falls.   9.  Anemia of chronic disease in the setting of CKD -Stable.  10.  Debility -Seen by PT OT who are recommending SNF placement.  Estimated body mass index is 27.54 kg/m as calculated from the following:    Height as of 07/22/21: 6' (1.829 m).   Weight as of this encounter: 92.1 kg.  Discharge Instructions  Discharge Instructions     Diet - Carr sodium heart healthy   Complete by: As directed    Increase activity slowly   Complete by: As directed    No wound care   Complete by: As directed       Allergies as of 08/25/2021   No Known Allergies      Medication List     STOP taking these medications    aspirin 81 MG EC tablet   senna 8.6 MG Tabs tablet Commonly known as: SENOKOT   vitamin B-12 1000 MCG tablet Commonly known as: CYANOCOBALAMIN       TAKE these medications    acetaminophen 325 MG tablet Commonly known as: TYLENOL Take 2 tablets (650 mg total) by mouth every 6 (six) hours as needed for mild pain, fever or headache.   carbidopa-levodopa 25-100 MG tablet Commonly known as: SINEMET IR Take 1 tablet by mouth 3 (three) times daily.   fenofibrate 54 MG tablet Take 1 tablet (54 mg total) by mouth daily.   midodrine 10 MG tablet Commonly known as: PROAMATINE Take 1 tablet (10 mg total) by mouth 3 (three) times daily with meals. What changed:  medication strength how much to take   risperiDONE 0.5 MG tablet Commonly known as: RisperDAL Take 1 tablet (0.5 mg total) by mouth at bedtime.   sertraline 100 MG tablet Commonly known as: ZOLOFT Take 1 tablet (100 mg total) by mouth daily. What changed: how much to take   simvastatin 10 MG tablet Commonly known as: ZOCOR Take 1 tablet (10 mg total) by mouth daily at 6 PM.        Follow-up Information     Fernando Low, MD Follow up.   Specialty: Internal Medicine Contact information: [address] [place] [address] [phone]         Minus Breeding, MD .   Specialty: Cardiology Contact information: [address] [place] [address] [phone]                No Known Allergies  Consultations: cardiology   Procedures/Studies: CT  Head Wo Contrast  Result Date: 08/11/2021 CLINICAL DATA:  Blunt trauma., poly trauma EXAM: CT HEAD WITHOUT CONTRAST CT CERVICAL SPINE WITHOUT CONTRAST TECHNIQUE: Multidetector CT imaging of the head and cervical spine was performed following the standard protocol without intravenous contrast. Multiplanar CT image reconstructions of the cervical spine were also generated. RADIATION DOSE REDUCTION: This exam was performed according to the departmental dose-optimization program which includes automated exposure control, adjustment of the mA and/or kV according to patient size and/or use of iterative reconstruction technique. COMPARISON:  03/24/2021 FINDINGS: CT HEAD FINDINGS Brain: No acute intracranial hemorrhage. No focal mass lesion. No CT evidence of acute infarction. No midline shift or mass effect. No hydrocephalus. Basilar cisterns are patent. There are periventricular and subcortical white matter hypodensities. Generalized cortical atrophy. Vascular: No hyperdense vessel or unexpected calcification. Skull: Normal. Negative for fracture or focal lesion. Sinuses/Orbits: Paranasal sinuses and mastoid air cells are clear. Orbits are clear. Other: None. CT CERVICAL SPINE FINDINGS Alignment: Normal alignment of the cervical vertebral bodies. Skull base and vertebrae: Normal craniocervical junction. No loss of vertebral body height or disc height. Normal facet articulation. No evidence of fracture. Soft tissues and  spinal canal: No prevertebral soft tissue swelling. No perispinal or epidural hematoma. Disc levels: Endplate sclerosis due to space narrowing at C3-C4 C4-C5. Endplate spurring from A5-W9. No acute subluxation. No acute loss vertebral body height disc height. No evidence of fracture Calcified pannus posterior to the dens. Upper chest: Clear Other: Incidental finding of calcification of the nuchal ligament IMPRESSION: 1. No intracranial trauma. 2. Chronic atrophy and white matter microvascular disease. 3.  No cervical spine fracture. 4. Multilevel disc osteophytic disease. Electronically Signed   By: Suzy Bouchard M.D.   On: 08/11/2021 16:22   CT Cervical Spine Wo Contrast  Result Date: 08/11/2021 CLINICAL DATA:  Blunt trauma., poly trauma EXAM: CT HEAD WITHOUT CONTRAST CT CERVICAL SPINE WITHOUT CONTRAST TECHNIQUE: Multidetector CT imaging of the head and cervical spine was performed following the standard protocol without intravenous contrast. Multiplanar CT image reconstructions of the cervical spine were also generated. RADIATION DOSE REDUCTION: This exam was performed according to the departmental dose-optimization program which includes automated exposure control, adjustment of the mA and/or kV according to patient size and/or use of iterative reconstruction technique. COMPARISON:  03/24/2021 FINDINGS: CT HEAD FINDINGS Brain: No acute intracranial hemorrhage. No focal mass lesion. No CT evidence of acute infarction. No midline shift or mass effect. No hydrocephalus. Basilar cisterns are patent. There are periventricular and subcortical white matter hypodensities. Generalized cortical atrophy. Vascular: No hyperdense vessel or unexpected calcification. Skull: Normal. Negative for fracture or focal lesion. Sinuses/Orbits: Paranasal sinuses and mastoid air cells are clear. Orbits are clear. Other: None. CT CERVICAL SPINE FINDINGS Alignment: Normal alignment of the cervical vertebral bodies. Skull base and vertebrae: Normal craniocervical junction. No loss of vertebral body height or disc height. Normal facet articulation. No evidence of fracture. Soft tissues and spinal canal: No prevertebral soft tissue swelling. No perispinal or epidural hematoma. Disc levels: Endplate sclerosis due to space narrowing at C3-C4 C4-C5. Endplate spurring from V9-Y8. No acute subluxation. No acute loss vertebral body height disc height. No evidence of fracture Calcified pannus posterior to the dens. Upper chest: Clear Other:  Incidental finding of calcification of the nuchal ligament IMPRESSION: 1. No intracranial trauma. 2. Chronic atrophy and white matter microvascular disease. 3. No cervical spine fracture. 4. Multilevel disc osteophytic disease. Electronically Signed   By: Suzy Bouchard M.D.   On: 08/11/2021 16:22   (Echo, Carotid, EGD, Colonoscopy, ERCP)    Subjective:  Resting in bed in nad  No c/o  Resting tremors noted   Discharge Exam: Vitals:   08/24/21 2027 08/25/21 0456  BP: 115/73 137/75  Pulse: (!) 59 (!) 50  Resp: 18 18  Temp: (!) 97.5 F (36.4 C) (!) 97.4 F (36.3 C)  SpO2: 95% 97%   Vitals:   08/24/21 0518 08/24/21 1635 08/24/21 2027 08/25/21 0456  BP:  (!) 141/91 115/73 137/75  Pulse:  97 (!) 59 (!) 50  Resp:  16 18 18   Temp:  98.3 F (36.8 C) (!) 97.5 F (36.4 C) (!) 97.4 F (36.3 C)  TempSrc:  Oral  Oral  SpO2:  99% 95% 97%  Weight: 92 kg   92.1 kg    General: Pt is alert, awake, not in acute distress Cardiovascular: RRR, S1/S2 +, no rubs, no gallops Respiratory: CTA bilaterally, no wheezing, no rhonchi Abdominal: Soft, NT, ND, bowel sounds + Extremities: no edema, no cyanosis    The results of significant diagnostics from this hospitalization (including imaging, microbiology, ancillary and laboratory) are listed below for reference.  Microbiology: No results found for this or any previous visit (from the past 240 hour(s)).   Labs: BNP (last 3 results) No results for input(s): BNP in the last 8760 hours. Basic Metabolic Panel: Recent Labs  Lab 08/20/21 0908  NA 136  K 4.4  CL 105  CO2 27  GLUCOSE 131*  BUN 38*  CREATININE 1.40*  CALCIUM 9.7   Liver Function Tests: No results for input(s): AST, ALT, ALKPHOS, BILITOT, PROT, ALBUMIN in the last 168 hours. No results for input(s): LIPASE, AMYLASE in the last 168 hours. No results for input(s): AMMONIA in the last 168 hours. CBC: No results for input(s): WBC, NEUTROABS, HGB, HCT, MCV, PLT in the  last 168 hours. Cardiac Enzymes: No results for input(s): CKTOTAL, CKMB, CKMBINDEX, TROPONINI in the last 168 hours. BNP: Invalid input(s): POCBNP CBG: Recent Labs  Lab 08/21/21 0502 08/22/21 0448 08/23/21 0452 08/24/21 0433 08/25/21 0457  GLUCAP 80 85 79 95 114*   D-Dimer No results for input(s): DDIMER in the last 72 hours. Hgb A1c No results for input(s): HGBA1C in the last 72 hours. Lipid Profile No results for input(s): CHOL, HDL, LDLCALC, TRIG, CHOLHDL, LDLDIRECT in the last 72 hours. Thyroid function studies No results for input(s): TSH, T4TOTAL, T3FREE, THYROIDAB in the last 72 hours.  Invalid input(s): FREET3 Anemia work up No results for input(s): VITAMINB12, FOLATE, FERRITIN, TIBC, IRON, RETICCTPCT in the last 72 hours. Urinalysis    Component Value Date/Time   COLORURINE YELLOW 08/11/2021 2130   APPEARANCEUR CLOUDY (A) 08/11/2021 2130   LABSPEC 1.015 08/11/2021 2130   PHURINE 5.0 08/11/2021 2130   GLUCOSEU 50 (A) 08/11/2021 2130   HGBUR NEGATIVE 08/11/2021 2130   BILIRUBINUR NEGATIVE 08/11/2021 2130   [place] NEGATIVE 08/11/2021 2130   PROTEINUR 30 (A) 08/11/2021 2130   UROBILINOGEN 1.0 08/19/2014 1745   NITRITE POSITIVE (A) 08/11/2021 2130   LEUKOCYTESUR LARGE (A) 08/11/2021 2130   Sepsis Labs Invalid input(s): PROCALCITONIN,  WBC,  LACTICIDVEN Microbiology No results found for this or any previous visit (from the past 240 hour(s)).   Time coordinating discharge: 39 minutes  SIGNED:   Georgette Shell, MD  [facility] 08/25/2021, 10:29 AM

## 2021-08-25 NOTE — TOC Progression Note (Signed)
Transition of Care [facility]) - Progression Note    Patient Details  Name: Fernando Carr MRN: [id number] Date of Birth: [birth year]  Transition of Care [facility]) CM/SW Contact  Kahdijah Errickson, Juliann Pulse, RN Phone Number: 08/25/2021, 10:16 AM  Clinical Narrative: [facility] has auth-rep Jenasia Dolinar-awaiting if medically stable for d/c summary, & covid.MD notified.        Barriers to Discharge: No Barriers Identified  Expected Discharge Plan and Services                                                 Social Determinants of Health (SDOH) Interventions    Readmission Risk Interventions No flowsheet data found.

## 2021-08-25 NOTE — Progress Notes (Addendum)
MD order to discharge to facility.  Report given to  [facility]. [phone]  Also given writer's work number for any questions after report received.   Unknown time of arrival for facility but made aware [facility] notified for transport.

## 2021-08-25 NOTE — TOC Transition Note (Addendum)
Transition of Care [facility]) - CM/SW Discharge Note   Patient Details  Name: Fernando Carr MRN: [id number] Date of Birth: May 28, 1941  Transition of Care [facility]) CM/SW Contact:  Dessa Phi, RN Phone Number: 08/25/2021, 10:29 AM   Clinical Narrative:  d/c [place]. No covid needed. Will call [facility] once ready.  -12:20p-[facility] called. No further CM needs.      Barriers to Discharge: No Barriers Identified   Patient Goals and CMS Choice        Discharge Placement              Patient chooses bed at: [facility] Patient to be transferred to facility by: Jena Name of family member notified: Arbie Cookey sister left message Patient and family notified of of transfer: 08/25/21  Discharge Plan and Services                                     Social Determinants of Health (SDOH) Interventions     Readmission Risk Interventions No flowsheet data found.

## 2021-08-26 DIAGNOSIS — L89152 Pressure ulcer of sacral region, stage 2: Secondary | ICD-10-CM | POA: Diagnosis not present

## 2021-08-26 DIAGNOSIS — G909 Disorder of the autonomic nervous system, unspecified: Secondary | ICD-10-CM | POA: Diagnosis not present

## 2021-08-26 DIAGNOSIS — R55 Syncope and collapse: Secondary | ICD-10-CM | POA: Diagnosis not present

## 2021-08-26 DIAGNOSIS — L89896 Pressure-induced deep tissue damage of other site: Secondary | ICD-10-CM | POA: Diagnosis not present

## 2021-08-26 DIAGNOSIS — G20 Parkinson's disease: Secondary | ICD-10-CM | POA: Diagnosis not present

## 2021-08-26 DIAGNOSIS — I480 Paroxysmal atrial fibrillation: Secondary | ICD-10-CM | POA: Diagnosis not present

## 2021-08-26 DIAGNOSIS — F319 Bipolar disorder, unspecified: Secondary | ICD-10-CM | POA: Diagnosis not present

## 2021-08-27 DIAGNOSIS — G20 Parkinson's disease: Secondary | ICD-10-CM | POA: Diagnosis not present

## 2021-08-27 DIAGNOSIS — I951 Orthostatic hypotension: Secondary | ICD-10-CM | POA: Diagnosis not present

## 2021-08-27 DIAGNOSIS — R296 Repeated falls: Secondary | ICD-10-CM | POA: Diagnosis not present

## 2021-08-30 DIAGNOSIS — L89152 Pressure ulcer of sacral region, stage 2: Secondary | ICD-10-CM | POA: Diagnosis not present

## 2021-08-30 DIAGNOSIS — L89896 Pressure-induced deep tissue damage of other site: Secondary | ICD-10-CM | POA: Diagnosis not present

## 2021-09-01 ENCOUNTER — Telehealth (HOSPITAL_COMMUNITY): Payer: Medicare HMO | Admitting: Psychiatry

## 2021-09-06 DIAGNOSIS — L89896 Pressure-induced deep tissue damage of other site: Secondary | ICD-10-CM | POA: Diagnosis not present

## 2021-09-06 DIAGNOSIS — L89152 Pressure ulcer of sacral region, stage 2: Secondary | ICD-10-CM | POA: Diagnosis not present

## 2021-09-09 ENCOUNTER — Encounter (HOSPITAL_COMMUNITY): Payer: Self-pay | Admitting: Psychiatry

## 2021-09-09 ENCOUNTER — Ambulatory Visit (HOSPITAL_BASED_OUTPATIENT_CLINIC_OR_DEPARTMENT_OTHER): Payer: Medicare HMO | Admitting: Psychiatry

## 2021-09-09 ENCOUNTER — Other Ambulatory Visit: Payer: Self-pay

## 2021-09-09 DIAGNOSIS — F332 Major depressive disorder, recurrent severe without psychotic features: Secondary | ICD-10-CM

## 2021-09-09 MED ORDER — SERTRALINE HCL 100 MG PO TABS: 100.0000 mg | ORAL_TABLET | Freq: Every day | ORAL | 2 refills | Status: AC

## 2021-09-09 MED ORDER — RISPERIDONE 0.5 MG PO TABS: 0.5000 mg | ORAL_TABLET | Freq: Every day | ORAL | 2 refills | Status: AC

## 2021-09-09 NOTE — Progress Notes (Signed)
Location: Patient: Office  Provider: Office   History of Present Illness: Patient came for his follow-up appointment with his geriatric care coordinator Campbell.  He appears weak, tired and have difficulty walking.  He is using walker.  He grew up beard.  Patient admitted that he was not doing very well when he was living in [facility] because he was not taking his medication on time.  He started to have blackout, pass out and more depressed before Christmas.  He was having suicidal thoughts and plan to take antifreeze before Christmas and he was recommended to go to ER.  Patient feeling better now as he is getting physical therapy at [facility] rehab.  He had a ER visit 3 weeks ago when he passed out and found to be dehydrated.  He was given IV fluids.  Since taking his medication at rehab he is actually doing better.  He is mentally strong and denies any crying spells, suicidal thoughts, paranoia or any hallucination.  He is still helps his daughter paying the car payment who now lives in [place].  Patient is getting physical therapy and occupational therapy and rehab place.  His Risperdal dose remained the same but Zoloft was decreased to 100 mg.  Now he does enjoy life but sometimes get very nervous and anxious because he gets too overwhelmed by himself.  He admitted to feeling lonely and sad.  He also have difficulty taking care of himself.  Patient was living in assisted living facility at [facility].  I have discussion with his geriatric care coordinator that patient requires higher level of care for medication compliance, supervision and needs structure in his life.  She is working with insurance company to have a safe and better place to live.  We decided not to change the medication since working well when he takes.  He has chronic insufficiency but with hydration his creatinine is stable.  Denies any mania, psychosis, anger or any highs or lows.  Denies any suicidal thoughts.  Past  Psychiatric History:  H/O suicidal attempt by injecting Ethyline Glycol. Had acute renal failure and required dialysis at that time.  H/O paranoia and mania with impulsive buying of expensive cars and selling property when depressed. Tried Abilify and Lexapro.  However he had a good response with Risperdal.    Psychiatric Specialty Exam: Physical Exam  Review of Systems  Blood pressure 121/67, pulse (!) 239, temperature 98.4 F (36.9 C), temperature source Oral, height 6' (1.829 m), weight 198 lb (89.8 kg), SpO2 (!) 85 %.There is no height or weight on file to calculate BMI.  General Appearance: Fairly Groomed, uses walker. Long beard  Eye Contact:  Fair  Speech:  Slow  Volume:  Decreased  Mood:  Euthymic  Affect:  Congruent  Thought Process:  Descriptions of Associations: Intact  Orientation:  Full (Time, Place, and Person)  Thought Content:  Rumination  Suicidal Thoughts:  No  Homicidal Thoughts:  No  Memory:  Immediate;   Fair Recent;   Fair Remote;   Fair  Judgement:  Fair  Insight:  Shallow  Psychomotor Activity:  Decreased, Tremor, and balance issue  Concentration:  Concentration: Fair and Attention Span: Fair  Recall:  [employer] of Knowledge:  Fair  Language:  Fair  Akathisia:  Yes  Handed:  Right  AIMS (if indicated):     Assets:  Communication Skills Desire for Improvement  ADL's:  Impaired  Cognition:  Impaired,  Mild  Sleep:   ok  Assessment and Plan: Bipolar disorder type I.  Anxiety.  I reviewed blood work results and repeated ER visit for blackouts, passed out and fall.  I do believe patient need a high level of care because he cannot live by himself.  Maybe a skilled nursing facility is a better option rather than assisted living facility.  When he takes the medicine on time he do well.  We discussed long-term prognosis.  I encouraged to use walker for ambulation.  We discussed improving his personal hygiene and grooming.  We decided not to change the  medication since it is working well when he takes it.  Continue Zoloft 100 mg daily and Risperdal 0.5 mg at bedtime.  Plan discussed with the geriatric care coordinator in detail.  We will follow up in 3 months.  Follow Up Instructions:    I discussed the assessment and treatment plan with the patient. The patient was provided an opportunity to ask questions and all were answered. The patient agreed with the plan and demonstrated an understanding of the instructions.   The patient was advised to call back or seek an in-person evaluation if the symptoms worsen or if the condition fails to improve as anticipated.  I provided 30 minutes of non-face-to-face time during this encounter.   Kathlee Nations, MD

## 2021-09-13 DIAGNOSIS — L89892 Pressure ulcer of other site, stage 2: Secondary | ICD-10-CM | POA: Diagnosis not present

## 2021-09-15 DIAGNOSIS — G20 Parkinson's disease: Secondary | ICD-10-CM | POA: Diagnosis not present

## 2021-09-15 DIAGNOSIS — R2689 Other abnormalities of gait and mobility: Secondary | ICD-10-CM | POA: Diagnosis not present

## 2021-09-15 DIAGNOSIS — M6281 Muscle weakness (generalized): Secondary | ICD-10-CM | POA: Diagnosis not present

## 2021-09-15 DIAGNOSIS — Z1159 Encounter for screening for other viral diseases: Secondary | ICD-10-CM | POA: Diagnosis not present

## 2021-09-16 DIAGNOSIS — G20 Parkinson's disease: Secondary | ICD-10-CM | POA: Diagnosis not present

## 2021-09-16 DIAGNOSIS — Z1159 Encounter for screening for other viral diseases: Secondary | ICD-10-CM | POA: Diagnosis not present

## 2021-09-16 DIAGNOSIS — R2689 Other abnormalities of gait and mobility: Secondary | ICD-10-CM | POA: Diagnosis not present

## 2021-09-16 DIAGNOSIS — M6281 Muscle weakness (generalized): Secondary | ICD-10-CM | POA: Diagnosis not present

## 2021-09-17 DIAGNOSIS — Z1159 Encounter for screening for other viral diseases: Secondary | ICD-10-CM | POA: Diagnosis not present

## 2021-09-17 DIAGNOSIS — G20 Parkinson's disease: Secondary | ICD-10-CM | POA: Diagnosis not present

## 2021-09-17 DIAGNOSIS — R2689 Other abnormalities of gait and mobility: Secondary | ICD-10-CM | POA: Diagnosis not present

## 2021-09-17 DIAGNOSIS — M6281 Muscle weakness (generalized): Secondary | ICD-10-CM | POA: Diagnosis not present

## 2021-09-20 DIAGNOSIS — G20 Parkinson's disease: Secondary | ICD-10-CM | POA: Diagnosis not present

## 2021-09-20 DIAGNOSIS — F3189 Other bipolar disorder: Secondary | ICD-10-CM | POA: Diagnosis not present

## 2021-09-20 DIAGNOSIS — L89892 Pressure ulcer of other site, stage 2: Secondary | ICD-10-CM | POA: Diagnosis not present

## 2021-09-20 DIAGNOSIS — F332 Major depressive disorder, recurrent severe without psychotic features: Secondary | ICD-10-CM | POA: Diagnosis not present

## 2021-09-20 DIAGNOSIS — R55 Syncope and collapse: Secondary | ICD-10-CM | POA: Diagnosis not present

## 2021-09-20 DIAGNOSIS — F419 Anxiety disorder, unspecified: Secondary | ICD-10-CM | POA: Diagnosis not present

## 2021-09-20 DIAGNOSIS — R296 Repeated falls: Secondary | ICD-10-CM | POA: Diagnosis not present

## 2021-09-20 DIAGNOSIS — R2689 Other abnormalities of gait and mobility: Secondary | ICD-10-CM | POA: Diagnosis not present

## 2021-09-20 DIAGNOSIS — F02818 Dementia in other diseases classified elsewhere, unspecified severity, with other behavioral disturbance: Secondary | ICD-10-CM | POA: Diagnosis not present

## 2021-09-27 DIAGNOSIS — H353131 Nonexudative age-related macular degeneration, bilateral, early dry stage: Secondary | ICD-10-CM | POA: Diagnosis not present

## 2021-09-28 DIAGNOSIS — D692 Other nonthrombocytopenic purpura: Secondary | ICD-10-CM | POA: Diagnosis not present

## 2021-09-28 DIAGNOSIS — F331 Major depressive disorder, recurrent, moderate: Secondary | ICD-10-CM | POA: Diagnosis not present

## 2021-09-28 DIAGNOSIS — F3170 Bipolar disorder, currently in remission, most recent episode unspecified: Secondary | ICD-10-CM | POA: Diagnosis not present

## 2021-09-28 DIAGNOSIS — G20 Parkinson's disease: Secondary | ICD-10-CM | POA: Diagnosis not present

## 2021-09-28 DIAGNOSIS — E782 Mixed hyperlipidemia: Secondary | ICD-10-CM | POA: Diagnosis not present

## 2021-09-28 DIAGNOSIS — R7303 Prediabetes: Secondary | ICD-10-CM | POA: Diagnosis not present

## 2021-09-28 DIAGNOSIS — N1831 Chronic kidney disease, stage 3a: Secondary | ICD-10-CM | POA: Diagnosis not present

## 2021-09-28 DIAGNOSIS — I739 Peripheral vascular disease, unspecified: Secondary | ICD-10-CM | POA: Diagnosis not present

## 2021-09-28 DIAGNOSIS — I480 Paroxysmal atrial fibrillation: Secondary | ICD-10-CM | POA: Diagnosis not present

## 2021-11-16 DIAGNOSIS — R0989 Other specified symptoms and signs involving the circulatory and respiratory systems: Secondary | ICD-10-CM | POA: Diagnosis not present

## 2021-11-16 DIAGNOSIS — R269 Unspecified abnormalities of gait and mobility: Secondary | ICD-10-CM | POA: Diagnosis not present

## 2021-11-16 DIAGNOSIS — I951 Orthostatic hypotension: Secondary | ICD-10-CM | POA: Diagnosis not present

## 2021-11-17 ENCOUNTER — Encounter (HOSPITAL_COMMUNITY): Payer: Self-pay | Admitting: Emergency Medicine

## 2021-11-17 ENCOUNTER — Emergency Department (HOSPITAL_COMMUNITY)
Admission: EM | Admit: 2021-11-17 | Discharge: 2021-11-17 | Disposition: A | Discharge status: Home / Self Care | Payer: Medicare HMO | Attending: Emergency Medicine | Admitting: Emergency Medicine

## 2021-11-17 ENCOUNTER — Emergency Department (HOSPITAL_COMMUNITY): Payer: Medicare HMO

## 2021-11-17 ENCOUNTER — Other Ambulatory Visit: Payer: Self-pay

## 2021-11-17 DIAGNOSIS — Z79899 Other long term (current) drug therapy: Secondary | ICD-10-CM | POA: Insufficient documentation

## 2021-11-17 DIAGNOSIS — J439 Emphysema, unspecified: Secondary | ICD-10-CM | POA: Diagnosis not present

## 2021-11-17 DIAGNOSIS — Z20822 Contact with and (suspected) exposure to covid-19: Secondary | ICD-10-CM | POA: Insufficient documentation

## 2021-11-17 DIAGNOSIS — R079 Chest pain, unspecified: Secondary | ICD-10-CM | POA: Diagnosis not present

## 2021-11-17 DIAGNOSIS — J441 Chronic obstructive pulmonary disease with (acute) exacerbation: Secondary | ICD-10-CM | POA: Diagnosis not present

## 2021-11-17 DIAGNOSIS — R7989 Other specified abnormal findings of blood chemistry: Secondary | ICD-10-CM | POA: Diagnosis not present

## 2021-11-17 DIAGNOSIS — J449 Chronic obstructive pulmonary disease, unspecified: Secondary | ICD-10-CM | POA: Insufficient documentation

## 2021-11-17 DIAGNOSIS — R0602 Shortness of breath: Secondary | ICD-10-CM | POA: Diagnosis not present

## 2021-11-17 DIAGNOSIS — N189 Chronic kidney disease, unspecified: Secondary | ICD-10-CM | POA: Insufficient documentation

## 2021-11-17 DIAGNOSIS — Z8616 Personal history of COVID-19: Secondary | ICD-10-CM | POA: Insufficient documentation

## 2021-11-17 DIAGNOSIS — I129 Hypertensive chronic kidney disease with stage 1 through stage 4 chronic kidney disease, or unspecified chronic kidney disease: Secondary | ICD-10-CM | POA: Insufficient documentation

## 2021-11-17 LAB — CBC WITH DIFFERENTIAL/PLATELET
Abs Immature Granulocytes: 0.03 10*3/uL (ref 0.00–0.07)
Basophils Absolute: 0 10*3/uL (ref 0.0–0.1)
Basophils Relative: 0 %
Eosinophils Absolute: 0 10*3/uL (ref 0.0–0.5)
Eosinophils Relative: 0 %
HCT: 37.1 % — ABNORMAL LOW (ref 39.0–52.0)
Hemoglobin: 11 g/dL — ABNORMAL LOW (ref 13.0–17.0)
Immature Granulocytes: 1 %
Lymphocytes Relative: 15 %
Lymphs Abs: 0.9 10*3/uL (ref 0.7–4.0)
MCH: 27.1 pg (ref 26.0–34.0)
MCHC: 29.6 g/dL — ABNORMAL LOW (ref 30.0–36.0)
MCV: 91.4 fL (ref 80.0–100.0)
Monocytes Absolute: 0.5 10*3/uL (ref 0.1–1.0)
Monocytes Relative: 9 %
Neutro Abs: 4.6 10*3/uL (ref 1.7–7.7)
Neutrophils Relative %: 75 %
Platelets: 135 10*3/uL — ABNORMAL LOW (ref 150–400)
RBC: 4.06 MIL/uL — ABNORMAL LOW (ref 4.22–5.81)
RDW: 13.9 % (ref 11.5–15.5)
WBC: 6 10*3/uL (ref 4.0–10.5)
nRBC: 0 % (ref 0.0–0.2)

## 2021-11-17 LAB — BASIC METABOLIC PANEL
Anion gap: 7 (ref 5–15)
BUN: 53 mg/dL — ABNORMAL HIGH (ref 8–23)
CO2: 25 mmol/L (ref 22–32)
Calcium: 10.5 mg/dL — ABNORMAL HIGH (ref 8.9–10.3)
Chloride: 108 mmol/L (ref 98–111)
Creatinine, Ser: 1.63 mg/dL — ABNORMAL HIGH (ref 0.61–1.24)
GFR, Estimated: 42 mL/min — ABNORMAL LOW (ref >60)
Glucose, Bld: 104 mg/dL — ABNORMAL HIGH (ref 70–99)
Potassium: 4.6 mmol/L (ref 3.5–5.1)
Sodium: 140 mmol/L (ref 135–145)

## 2021-11-17 LAB — RESP PANEL BY RT-PCR (FLU A&B, COVID) ARPGX2
Influenza A by PCR: NEGATIVE
Influenza B by PCR: NEGATIVE
SARS Coronavirus 2 by RT PCR: NEGATIVE

## 2021-11-17 LAB — BRAIN NATRIURETIC PEPTIDE: B Natriuretic Peptide: 116 pg/mL — ABNORMAL HIGH (ref 0.0–100.0)

## 2021-11-17 MED ORDER — IPRATROPIUM-ALBUTEROL 0.5-2.5 (3) MG/3ML IN SOLN
3.0000 mL | Freq: Once | RESPIRATORY_TRACT | Status: AC
  Administered 2021-11-17: 3 mL via RESPIRATORY_TRACT
  Filled 2021-11-17: qty 3

## 2021-11-17 MED ORDER — ALBUTEROL SULFATE (2.5 MG/3ML) 0.083% IN NEBU
2.5000 mg | INHALATION_SOLUTION | Freq: Once | RESPIRATORY_TRACT | Status: AC
  Administered 2021-11-17: 2.5 mg via RESPIRATORY_TRACT
  Filled 2021-11-17: qty 3

## 2021-11-17 MED ORDER — PREDNISONE 20 MG PO TABS: 40.0000 mg | ORAL_TABLET | Freq: Every day | ORAL | 0 refills | Status: DC

## 2021-11-17 MED ORDER — DOXYCYCLINE HYCLATE 100 MG PO CAPS: 100.0000 mg | ORAL_CAPSULE | Freq: Two times a day (BID) | ORAL | 0 refills | Status: DC

## 2021-11-17 MED ORDER — ALBUTEROL SULFATE HFA 108 (90 BASE) MCG/ACT IN AERS: 2.0000 | INHALATION_SPRAY | RESPIRATORY_TRACT | Status: DC | PRN

## 2021-11-17 MED ORDER — METHYLPREDNISOLONE SODIUM SUCC 125 MG IJ SOLR
125.0000 mg | Freq: Once | INTRAMUSCULAR | Status: AC
  Administered 2021-11-17: 125 mg via INTRAVENOUS
  Filled 2021-11-17: qty 2

## 2021-11-17 MED ORDER — ALBUTEROL SULFATE HFA 108 (90 BASE) MCG/ACT IN AERS
2.0000 | INHALATION_SPRAY | Freq: Once | RESPIRATORY_TRACT | Status: AC
Start: 2021-11-17 — End: 2021-11-17
  Administered 2021-11-17: 2 via RESPIRATORY_TRACT
  Filled 2021-11-17: qty 6.7

## 2021-11-17 NOTE — ED Notes (Signed)
[facility] states that they will pick pt up within an hour ?

## 2021-11-17 NOTE — Discharge Instructions (Signed)
Use your albuterol inhaler 1 to 2 puffs every 4-6 hours as needed.  Take the antibiotic as directed until its finished.  You have also been prescribed steroids to help with your breathing.  Please follow-up with your primary care provider in a few days for recheck.  Return to the emergency department for any new or worsening symptoms. ?

## 2021-11-17 NOTE — ED Notes (Signed)
Patient transported to X-ray 

## 2021-11-17 NOTE — ED Provider Notes (Signed)
?[place] ?Provider Note ? ? ?CSN: [id number] ?Arrival date & time: 11/17/21  1432 ? ?  ? ?History ? ?Chief Complaint  ?Patient presents with  ? Shortness of Breath  ? ? ?Fernando Carr is a 81 y.o. male. ? ? ?Shortness of Breath ?Associated symptoms: chest pain and cough   ?Associated symptoms: no abdominal pain, no fever, no headaches, no rash, no sore throat, no vomiting and no wheezing   ? ?  ? ? ?Fernando Carr is a 81 y.o. male who comes from [facility] SNF with past medical history of Parkinson's, paroxysmal atrial fibrillation, hypertension, CKD, hyperlipidemia, and who presents to the Emergency Department complaining of cough, shortness of breath and chest pain associated with forceful coughing.  Symptoms have been present x 5 days.  Cough is mostly nonproductive.  Patient states symptoms initially began as a "cold" he denies any fever or chills, abdominal pain, nausea or vomiting.  Denies history of COPD, does not require supplemental oxygen at baseline.  Denies any swelling of his lower extremities.  COVID-positive in January of this year. ? ? ? ?Home Medications ?Prior to Admission medications   ?Medication Sig Start Date End Date Taking? Authorizing Provider  ?acetaminophen (TYLENOL) 325 MG tablet Take 2 tablets (650 mg total) by mouth every 6 (six) hours as needed for mild pain, fever or headache. 08/25/21  Yes Georgette Shell, MD  ?carbidopa-levodopa (SINEMET IR) 25-100 MG tablet Take 1 tablet by mouth 3 (three) times daily. 04/23/21  Yes Medina-Vargas, Monina C, NP  ?fenofibrate 54 MG tablet Take 1 tablet (54 mg total) by mouth daily. 04/23/21  Yes Medina-Vargas, Monina C, NP  ?midodrine (PROAMATINE) 10 MG tablet Take 1 tablet (10 mg total) by mouth 3 (three) times daily with meals. 08/25/21  Yes Georgette Shell, MD  ?risperiDONE (RISPERDAL) 0.5 MG tablet Take 1 tablet (0.5 mg total) by mouth at bedtime. 09/09/21  Yes [name], [name], MD  ?sertraline (ZOLOFT) 100 MG tablet Take 1  tablet (100 mg total) by mouth daily. 09/09/21  Yes [name], [name], MD  ?simvastatin (ZOCOR) 10 MG tablet Take 1 tablet (10 mg total) by mouth daily at 6 PM. 04/23/21  Yes Medina-Vargas, Monina C, NP  ?   ? ?Allergies    ?Patient has no known allergies.   ? ?Review of Systems   ?Review of Systems  ?Constitutional:  Negative for chills and fever.  ?HENT:  Negative for congestion and sore throat.   ?Respiratory:  Positive for cough and shortness of breath. Negative for wheezing.   ?Cardiovascular:  Positive for chest pain.  ?Gastrointestinal:  Negative for abdominal pain, nausea and vomiting.  ?Genitourinary:  Negative for dysuria.  ?Skin:  Negative for rash.  ?Neurological:  Negative for weakness, numbness and headaches.  ?Psychiatric/Behavioral:  Negative for confusion.   ?All other systems reviewed and are negative. ? ?Physical Exam ?Updated Vital Signs ?BP (!) 174/113   Pulse 81   Temp (!) 96.1 ?F (35.6 ?C) (Axillary)   Resp 20   Ht 6' (1.829 m)   Wt 91.6 kg   SpO2 96%   BMI 27.40 kg/m?  ?Physical Exam ?Vitals and nursing note reviewed.  ?Constitutional:   ?   Appearance: He is well-developed. He is not ill-appearing.  ?HENT:  ?   Mouth/Throat:  ?   Mouth: Mucous membranes are moist.  ?   Pharynx: Oropharynx is clear.  ?Cardiovascular:  ?   Rate and Rhythm: Normal rate.  ?  Pulses: Normal pulses.  ?Pulmonary:  ?   Effort: Pulmonary effort is normal.  ?   Breath sounds: Rhonchi present.  ?   Comments: Rhonchorous lung sounds bilaterally.  No wheezing.  No increased work of breathing.  Patient is on 1 L oxygen by nasal cannula. ?Abdominal:  ?   General: There is no distension.  ?   Palpations: Abdomen is soft.  ?   Tenderness: There is no abdominal tenderness.  ?Musculoskeletal:     ?   General: No swelling.  ?   Right lower leg: No edema.  ?   Left lower leg: No edema.  ?Skin: ?   General: Skin is warm.  ?   Capillary Refill: Capillary refill takes less than 2 seconds.  ?   Findings: No erythema.   ?Neurological:  ?   General: No focal deficit present.  ?   Mental Status: He is alert.  ?   Sensory: No sensory deficit.  ?   Motor: No weakness.  ?   Comments: Patient has resting tremor, this is documented in medical record  ? ? ?ED Results / Procedures / Treatments   ?Labs ?(all labs ordered are listed, but only abnormal results are displayed) ?Labs Reviewed  ?BASIC METABOLIC PANEL - Abnormal; Notable for the following components:  ?    Result Value  ? Glucose, Bld 104 (*)   ? BUN 53 (*)   ? Creatinine, Ser 1.63 (*)   ? Calcium 10.5 (*)   ? GFR, Estimated 42 (*)   ? All other components within normal limits  ?CBC WITH DIFFERENTIAL/PLATELET - Abnormal; Notable for the following components:  ? RBC 4.06 (*)   ? Hemoglobin 11.0 (*)   ? HCT 37.1 (*)   ? MCHC 29.6 (*)   ? Platelets 135 (*)   ? All other components within normal limits  ?BRAIN NATRIURETIC PEPTIDE - Abnormal; Notable for the following components:  ? B Natriuretic Peptide 116.0 (*)   ? All other components within normal limits  ?RESP PANEL BY RT-PCR (FLU A&B, COVID) ARPGX2  ? ? ?EKG ?EKG Interpretation ? ?Date/Time:  Wednesday November 17 2021 14:58:17 EDT ?Ventricular Rate:  68 ?PR Interval:    ?QRS Duration: 104 ?QT Interval:  386 ?QTC Calculation: 380 ?R Axis:   -34 ?Text Interpretation: Atrial fibrillation Left axis deviation Minimal ST depression, lateral leads Confirmed by Noemi Chapel [phone]) on 11/17/2021 3:21:10 PM ? ?Radiology ?DG Chest 2 View ? ?Result Date: 11/17/2021 ?CLINICAL DATA:  Short of breath, pain with inspiration EXAM: CHEST - 2 VIEW COMPARISON:  07/21/2021 FINDINGS: Frontal and lateral views of the chest demonstrate an unremarkable cardiac silhouette. Lungs are hyperinflated, with mild background interstitial scarring compatible with emphysema. No airspace disease, effusion, or pneumothorax. No acute bony abnormalities. IMPRESSION: 1. Background emphysema.  No acute airspace disease. Electronically Signed   By: Randa Ngo M.D.    On: 11/17/2021 15:37   ? ?Procedures ?Procedures  ? ? ?Medications Ordered in ED ?Medications  ?albuterol (VENTOLIN HFA) 108 (90 Base) MCG/ACT inhaler 2 puff (has no administration in time range)  ?albuterol (PROVENTIL) (2.5 MG/3ML) 0.083% nebulizer solution 2.5 mg (2.5 mg Nebulization Given 11/17/21 1557)  ?ipratropium-albuterol (DUONEB) 0.5-2.5 (3) MG/3ML nebulizer solution 3 mL (3 mLs Nebulization Given 11/17/21 1557)  ? ? ?ED Course/ Medical Decision Making/ A&P ?  ?                        ?Medical  Decision Making ?Patient here from local SNF for evaluation of shortness of breath and cough.  Symptoms present for 5 days.  Has history of COPD does not require supplemental oxygen at baseline.  On arrival patient hypoxic sats in the upper 80s.  He was placed on 2 L oxygen by nasal cannula then reduced to 1 L with maintaining sats in the mid to upper 90s.  No peripheral edema. ? ?On my exam, patient does not appear in respiratory distress.  No increased work of breathing.  He has coarse lung sounds bilaterally with occasional expiratory wheezes noted.  No peripheral edema on exam.  He is nontoxic-appearing.  Hypertensive with hx of same.  Patient had a single documented heart rate of 241, patient did not have any runs of tachycardia, this is believed to have been documented in error. ? ?On recheck, patient lung sounds improved after albuterol nebs, he has received Solu-Medrol IV.  Supplemental oxygen DC'd by me, patient observed without hypoxia. ? ? ? ?Amount and/or Complexity of Data Reviewed ?External Data Reviewed: notes. ?   Details: Prior medical records reviewed by me ?Labs: ordered. ?   Details: Labs today show no evidence of leukocytosis.  Chemistries show elevated serum creatinine at 1.63 and BUN at 53, near baseline, he does have history of CKD.  BNP 116.  COVID and flu test negative ?Radiology: ordered. ?   Details: X-ray from today shows background emphysema with no acute airspace disease ?ECG/medicine  tests: ordered. ?   Details: EKG shows atrial fibrillation ? ?Risk ?Prescription drug management. ? ? ?Patient feeling better after albuterol nebs and Solu-Medrol.  No hypoxia no longer requiring supplemental oxygen.  Symptoms

## 2021-11-17 NOTE — ED Triage Notes (Signed)
Pt to ED via POV by [facility] staff. Pt c/o sob, pain when breathing that started this past Friday. Denies any fever, n/v ?

## 2021-11-17 NOTE — ED Notes (Signed)
Signature pad not working. 

## 2021-11-17 NOTE — ED Notes (Signed)
Pt put on 2LNC due to O2 being 88% at arrival. Pt O2 sat is now 98% ?

## 2021-11-17 NOTE — Progress Notes (Signed)
Pt had cannula in his nose but flowmeter off spo2 93% on room air ?

## 2021-11-22 ENCOUNTER — Emergency Department (HOSPITAL_COMMUNITY): Payer: Medicare HMO

## 2021-11-22 ENCOUNTER — Other Ambulatory Visit: Payer: Self-pay

## 2021-11-22 ENCOUNTER — Encounter (HOSPITAL_COMMUNITY): Payer: Self-pay | Admitting: *Deleted

## 2021-11-22 ENCOUNTER — Emergency Department (HOSPITAL_COMMUNITY)
Admission: EM | Admit: 2021-11-22 | Discharge: 2021-11-23 | Disposition: A | Discharge status: Home / Self Care | Payer: Medicare HMO | Attending: Emergency Medicine | Admitting: Emergency Medicine

## 2021-11-22 DIAGNOSIS — D631 Anemia in chronic kidney disease: Secondary | ICD-10-CM | POA: Diagnosis not present

## 2021-11-22 DIAGNOSIS — J441 Chronic obstructive pulmonary disease with (acute) exacerbation: Secondary | ICD-10-CM | POA: Insufficient documentation

## 2021-11-22 DIAGNOSIS — Z20822 Contact with and (suspected) exposure to covid-19: Secondary | ICD-10-CM | POA: Insufficient documentation

## 2021-11-22 DIAGNOSIS — R791 Abnormal coagulation profile: Secondary | ICD-10-CM | POA: Insufficient documentation

## 2021-11-22 DIAGNOSIS — G20 Parkinson's disease: Secondary | ICD-10-CM | POA: Insufficient documentation

## 2021-11-22 DIAGNOSIS — I700 Atherosclerosis of aorta: Secondary | ICD-10-CM | POA: Diagnosis not present

## 2021-11-22 DIAGNOSIS — N189 Chronic kidney disease, unspecified: Secondary | ICD-10-CM | POA: Diagnosis not present

## 2021-11-22 DIAGNOSIS — J449 Chronic obstructive pulmonary disease, unspecified: Secondary | ICD-10-CM | POA: Diagnosis not present

## 2021-11-22 DIAGNOSIS — I2510 Atherosclerotic heart disease of native coronary artery without angina pectoris: Secondary | ICD-10-CM | POA: Diagnosis not present

## 2021-11-22 DIAGNOSIS — Z7952 Long term (current) use of systemic steroids: Secondary | ICD-10-CM | POA: Diagnosis not present

## 2021-11-22 DIAGNOSIS — I129 Hypertensive chronic kidney disease with stage 1 through stage 4 chronic kidney disease, or unspecified chronic kidney disease: Secondary | ICD-10-CM | POA: Diagnosis not present

## 2021-11-22 DIAGNOSIS — R0602 Shortness of breath: Secondary | ICD-10-CM | POA: Insufficient documentation

## 2021-11-22 LAB — CBC WITH DIFFERENTIAL/PLATELET
Abs Immature Granulocytes: 0.18 10*3/uL — ABNORMAL HIGH (ref 0.00–0.07)
Basophils Absolute: 0 10*3/uL (ref 0.0–0.1)
Basophils Relative: 0 %
Eosinophils Absolute: 0 10*3/uL (ref 0.0–0.5)
Eosinophils Relative: 0 %
HCT: 38.8 % — ABNORMAL LOW (ref 39.0–52.0)
Hemoglobin: 12.4 g/dL — ABNORMAL LOW (ref 13.0–17.0)
Immature Granulocytes: 2 %
Lymphocytes Relative: 8 %
Lymphs Abs: 0.6 10*3/uL — ABNORMAL LOW (ref 0.7–4.0)
MCH: 27.9 pg (ref 26.0–34.0)
MCHC: 32 g/dL (ref 30.0–36.0)
MCV: 87.4 fL (ref 80.0–100.0)
Monocytes Absolute: 0.5 10*3/uL (ref 0.1–1.0)
Monocytes Relative: 6 %
Neutro Abs: 6.4 10*3/uL (ref 1.7–7.7)
Neutrophils Relative %: 84 %
Platelets: 223 10*3/uL (ref 150–400)
RBC: 4.44 MIL/uL (ref 4.22–5.81)
RDW: 13.3 % (ref 11.5–15.5)
WBC: 7.7 10*3/uL (ref 4.0–10.5)
nRBC: 0 % (ref 0.0–0.2)

## 2021-11-22 LAB — BLOOD GAS, ARTERIAL
Acid-Base Excess: 1.2 mmol/L (ref 0.0–2.0)
Bicarbonate: 26 mmol/L (ref 20.0–28.0)
Drawn by: 22223
FIO2: 21 %
O2 Saturation: 92.9 %
Patient temperature: 37
pCO2 arterial: 41 mmHg (ref 32–48)
pH, Arterial: 7.41 (ref 7.35–7.45)
pO2, Arterial: 59 mmHg — ABNORMAL LOW (ref 83–108)

## 2021-11-22 LAB — COMPREHENSIVE METABOLIC PANEL
ALT: 13 U/L (ref 0–44)
AST: 29 U/L (ref 15–41)
Albumin: 4.1 g/dL (ref 3.5–5.0)
Alkaline Phosphatase: 72 U/L (ref 38–126)
Anion gap: 8 (ref 5–15)
BUN: 55 mg/dL — ABNORMAL HIGH (ref 8–23)
CO2: 25 mmol/L (ref 22–32)
Calcium: 10.7 mg/dL — ABNORMAL HIGH (ref 8.9–10.3)
Chloride: 104 mmol/L (ref 98–111)
Creatinine, Ser: 1.75 mg/dL — ABNORMAL HIGH (ref 0.61–1.24)
GFR, Estimated: 39 mL/min — ABNORMAL LOW (ref >60)
Glucose, Bld: 103 mg/dL — ABNORMAL HIGH (ref 70–99)
Potassium: 4.2 mmol/L (ref 3.5–5.1)
Sodium: 137 mmol/L (ref 135–145)
Total Bilirubin: 0.5 mg/dL (ref 0.3–1.2)
Total Protein: 8.1 g/dL (ref 6.5–8.1)

## 2021-11-22 LAB — RESP PANEL BY RT-PCR (FLU A&B, COVID) ARPGX2
Influenza A by PCR: NEGATIVE
Influenza B by PCR: NEGATIVE
SARS Coronavirus 2 by RT PCR: NEGATIVE

## 2021-11-22 LAB — MAGNESIUM: Magnesium: 2.1 mg/dL (ref 1.7–2.4)

## 2021-11-22 LAB — BRAIN NATRIURETIC PEPTIDE: B Natriuretic Peptide: 222 pg/mL — ABNORMAL HIGH (ref 0.0–100.0)

## 2021-11-22 LAB — D-DIMER, QUANTITATIVE: D-Dimer, Quant: 3.2 ug/mL-FEU — ABNORMAL HIGH (ref 0.00–0.50)

## 2021-11-22 MED ORDER — LACTATED RINGERS IV BOLUS
500.0000 mL | Freq: Once | INTRAVENOUS | Status: AC
  Administered 2021-11-22: 500 mL via INTRAVENOUS

## 2021-11-22 MED ORDER — IOHEXOL 350 MG/ML SOLN
100.0000 mL | Freq: Once | INTRAVENOUS | Status: AC | PRN
  Administered 2021-11-22: 60 mL via INTRAVENOUS

## 2021-11-22 NOTE — ED Notes (Signed)
Patient transported to CT 

## 2021-11-22 NOTE — ED Notes (Signed)
ED Provider at bedside. 

## 2021-11-22 NOTE — ED Notes (Signed)
Warm blankets placed on patient due to temperature. ?

## 2021-11-22 NOTE — Progress Notes (Signed)
Rt dropped off ABG sample at 1922 to lab. ?

## 2021-11-22 NOTE — ED Triage Notes (Signed)
Family member states the home health nurse came out and checked his vitals and found his o2 sats at 54%; family member states pt still c/o sob and recently was seen here for same complaint; pt finished his prednisone last week ?

## 2021-11-22 NOTE — ED Provider Notes (Signed)
?[facility] ?Provider Note ? ? ?CSN: [id number] ?Arrival date & time: 11/22/21  1729 ? ?  ? ?History ? ?Chief Complaint  ?Patient presents with  ? Shortness of Breath  ? ? ?Fernando Carr is a 81 y.o. male. ? ? ?Shortness of Breath ?Patient presents for shortness of breath.  O2 sats at nursing facility are reportedly 54%.  His medical history includes AAA, CKD, atrial fibrillation, failure to thrive, HTN, depression, Parkinson's, and bipolar disorder.  He was seen in the emergency department 5 days ago.  At that time he was treated for COPD exacerbation.  He was discharged with prescriptions for doxycycline.  Patient currently endorses some anterior bilateral lower rib discomfort.  He feels that his breathing is currently shallow. ?  ? ?Home Medications ?Prior to Admission medications   ?Medication Sig Start Date End Date Taking? Authorizing Provider  ?acetaminophen (TYLENOL) 325 MG tablet Take 2 tablets (650 mg total) by mouth every 6 (six) hours as needed for mild pain, fever or headache. 08/25/21   Georgette Shell, MD  ?carbidopa-levodopa (SINEMET IR) 25-100 MG tablet Take 1 tablet by mouth 3 (three) times daily. 04/23/21   Medina-Vargas, Monina C, NP  ?doxycycline (VIBRAMYCIN) 100 MG capsule Take 1 capsule (100 mg total) by mouth 2 (two) times daily. 11/17/21   Triplett, Tammy, PA-C  ?fenofibrate 54 MG tablet Take 1 tablet (54 mg total) by mouth daily. 04/23/21   Medina-Vargas, Monina C, NP  ?midodrine (PROAMATINE) 10 MG tablet Take 1 tablet (10 mg total) by mouth 3 (three) times daily with meals. 08/25/21   Georgette Shell, MD  ?predniSONE (DELTASONE) 20 MG tablet Take 2 tablets (40 mg total) by mouth daily. 11/17/21   Triplett, Tammy, PA-C  ?risperiDONE (RISPERDAL) 0.5 MG tablet Take 1 tablet (0.5 mg total) by mouth at bedtime. 09/09/21   [name], [name], MD  ?sertraline (ZOLOFT) 100 MG tablet Take 1 tablet (100 mg total) by mouth daily. 09/09/21   [name], [name], MD  ?simvastatin (ZOCOR) 10 MG  tablet Take 1 tablet (10 mg total) by mouth daily at 6 PM. 04/23/21   Medina-Vargas, Monina C, NP  ?   ? ?Allergies    ?Patient has no known allergies.   ? ?Review of Systems   ?Review of Systems  ?Respiratory:  Positive for shortness of breath.   ?All other systems reviewed and are negative. ? ?Physical Exam ?Updated Vital Signs ?BP (!) 150/85   Pulse (!) 48   Temp (!) 97.4 ?F (36.3 ?C) (Rectal)   Resp 14   Ht 6' (1.829 m)   Wt 91 kg   SpO2 99%   BMI 27.21 kg/m?  ?Physical Exam ?Vitals and nursing note reviewed.  ?Constitutional:   ?   General: He is not in acute distress. ?   Appearance: He is well-developed and normal weight. He is not ill-appearing, toxic-appearing or diaphoretic.  ?HENT:  ?   Head: Normocephalic and atraumatic.  ?   Mouth/Throat:  ?   Mouth: Mucous membranes are moist.  ?   Pharynx: Oropharynx is clear. No oropharyngeal exudate.  ?Eyes:  ?   Conjunctiva/sclera: Conjunctivae normal.  ?Neck:  ?   Vascular: No JVD.  ?Cardiovascular:  ?   Rate and Rhythm: Regular rhythm.  ?   Heart sounds: No murmur heard. ?Pulmonary:  ?   Effort: Pulmonary effort is normal. No tachypnea, accessory muscle usage or respiratory distress.  ?   Breath sounds: Rhonchi present. No decreased  breath sounds, wheezing or rales.  ?Chest:  ?   Chest wall: No tenderness or edema.  ?Abdominal:  ?   Palpations: Abdomen is soft.  ?   Tenderness: There is no abdominal tenderness.  ?Musculoskeletal:     ?   General: No swelling.  ?   Cervical back: Normal range of motion and neck supple.  ?   Right lower leg: No edema.  ?   Left lower leg: No edema.  ?Skin: ?   General: Skin is warm and dry.  ?   Capillary Refill: Capillary refill takes less than 2 seconds.  ?Neurological:  ?   Mental Status: He is alert. Mental status is at baseline.  ?Psychiatric:     ?   Mood and Affect: Mood normal.     ?   Behavior: Behavior normal.  ? ? ?ED Results / Procedures / Treatments   ?Labs ?(all labs ordered are listed, but only abnormal results  are displayed) ?Labs Reviewed  ?COMPREHENSIVE METABOLIC PANEL - Abnormal; Notable for the following components:  ?    Result Value  ? Glucose, Bld 103 (*)   ? BUN 55 (*)   ? Creatinine, Ser 1.75 (*)   ? Calcium 10.7 (*)   ? GFR, Estimated 39 (*)   ? All other components within normal limits  ?CBC WITH DIFFERENTIAL/PLATELET - Abnormal; Notable for the following components:  ? Hemoglobin 12.4 (*)   ? HCT 38.8 (*)   ? Lymphs Abs 0.6 (*)   ? Abs Immature Granulocytes 0.18 (*)   ? All other components within normal limits  ?D-DIMER, QUANTITATIVE - Abnormal; Notable for the following components:  ? D-Dimer, Quant 3.20 (*)   ? All other components within normal limits  ?BRAIN NATRIURETIC PEPTIDE - Abnormal; Notable for the following components:  ? B Natriuretic Peptide 222.0 (*)   ? All other components within normal limits  ?BLOOD GAS, ARTERIAL - Abnormal; Notable for the following components:  ? pO2, Arterial 59 (*)   ? All other components within normal limits  ?RESP PANEL BY RT-PCR (FLU A&B, COVID) ARPGX2  ?MAGNESIUM  ? ? ?EKG ?None ? ?Radiology ?CT Angio Chest PE W and/or Wo Contrast ? ?Result Date: 11/22/2021 ?CLINICAL DATA:  Pulmonary embolism (PE) suspected, high prob Pulmonary embolism (PE) suspected, positive D-dimer. Chest pain, shortness of breath, low O2 sats. EXAM: CT ANGIOGRAPHY CHEST WITH CONTRAST TECHNIQUE: Multidetector CT imaging of the chest was performed using the standard protocol during bolus administration of intravenous contrast. Multiplanar CT image reconstructions and MIPs were obtained to evaluate the vascular anatomy. RADIATION DOSE REDUCTION: This exam was performed according to the departmental dose-optimization program which includes automated exposure control, adjustment of the mA and/or kV according to patient size and/or use of iterative reconstruction technique. CONTRAST:  11m OMNIPAQUE IOHEXOL 350 MG/ML SOLN COMPARISON:  02/23/2021 FINDINGS: Cardiovascular: No filling defects in the  pulmonary arteries to suggest pulmonary emboli. Heart is normal size. Stable slight outpouching off the aortic arch which could reflect a small saccular aneurysm or penetrating ulcer. This is unchanged since prior study. Coronary artery and aortic calcifications. Mediastinum/Nodes: No mediastinal, hilar, or axillary adenopathy. Trachea and esophagus are unremarkable. Thyroid unremarkable. Lungs/Pleura: Lungs are clear. No focal airspace opacities or suspicious nodules. No effusions. Upper Abdomen: No acute findings Musculoskeletal: Chest wall soft tissues are unremarkable. No acute bony abnormality. Review of the MIP images confirms the above findings. IMPRESSION: No evidence of pulmonary embolus. Stable outpouching off the aortic arch which  could reflect a small saccular aneurysm or penetrating ulcer, unchanged since prior study. Coronary artery disease. No acute cardiopulmonary disease. Aortic Atherosclerosis (ICD10-I70.0). Electronically Signed   By: Rolm Baptise M.D.   On: 11/22/2021 20:41  ? ?DG Chest Port 1 View ? ?Result Date: 11/22/2021 ?CLINICAL DATA:  Shortness of breath EXAM: PORTABLE CHEST 1 VIEW COMPARISON:  11/17/2021 FINDINGS: Heart and mediastinal contours are within normal limits. No focal opacities or effusions. No acute bony abnormality. IMPRESSION: No active disease. Electronically Signed   By: Rolm Baptise M.D.   On: 11/22/2021 19:36   ? ?Procedures ?Procedures  ? ? ?Medications Ordered in ED ?Medications  ?lactated ringers bolus 500 mL (0 mLs Intravenous Stopped 11/22/21 2046)  ?iohexol (OMNIPAQUE) 350 MG/ML injection 100 mL (60 mLs Intravenous Contrast Given 11/22/21 2030)  ? ? ?ED Course/ Medical Decision Making/ A&P ?  ?                        ?Medical Decision Making ?Amount and/or Complexity of Data Reviewed ?Labs: ordered. ?Radiology: ordered. ? ?Risk ?Prescription drug management. ? ? ?This patient presents to the ED for concern of hypoxia, this involves an extensive number of treatment  options, and is a complaint that carries with it a high risk of complications and morbidity.  The differential diagnosis includes pneumonia, PE, CHF, pneumothorax, false equipment reading ? ? ?Co morbidities tha

## 2021-11-23 MED ORDER — CARBIDOPA-LEVODOPA 25-100 MG PO TABS: 1.0000 | ORAL_TABLET | Freq: Three times a day (TID) | ORAL | Status: DC

## 2021-11-23 MED ORDER — RISPERIDONE 1 MG PO TABS: 0.5000 mg | ORAL_TABLET | Freq: Every day | ORAL | Status: DC

## 2021-11-23 MED ORDER — SIMVASTATIN 10 MG PO TABS: 10.0000 mg | ORAL_TABLET | Freq: Every day | ORAL | Status: DC

## 2021-11-23 NOTE — ED Notes (Signed)
Nurse from [facility] stated she would arrange transport for pt back to their facility. ETA of 30 mins. ?

## 2021-11-23 NOTE — Discharge Instructions (Addendum)
When your pulse oximeter is placed on your finger, the tremor can cause artifact resulting in falsely low oxygen readings.  Your oxygen here in the emergency department, when measured on your forehead, was in the range of 97 to 100% throughout your observation.  Your CT scan did not show any pneumonia or blood clots.  Your lab work was also reassuring.  Return to the emergency department at any time if you experience any new concerning symptoms. ?

## 2021-11-30 DIAGNOSIS — G20 Parkinson's disease: Secondary | ICD-10-CM | POA: Diagnosis not present

## 2021-11-30 DIAGNOSIS — J439 Emphysema, unspecified: Secondary | ICD-10-CM | POA: Diagnosis not present

## 2021-12-09 ENCOUNTER — Ambulatory Visit (HOSPITAL_COMMUNITY): Payer: Medicare HMO | Admitting: Psychiatry

## 2021-12-20 DIAGNOSIS — F315 Bipolar disorder, current episode depressed, severe, with psychotic features: Secondary | ICD-10-CM | POA: Diagnosis not present

## 2021-12-23 DIAGNOSIS — N183 Chronic kidney disease, stage 3 unspecified: Secondary | ICD-10-CM | POA: Diagnosis not present

## 2021-12-23 DIAGNOSIS — I739 Peripheral vascular disease, unspecified: Secondary | ICD-10-CM | POA: Diagnosis not present

## 2021-12-23 DIAGNOSIS — F331 Major depressive disorder, recurrent, moderate: Secondary | ICD-10-CM | POA: Diagnosis not present

## 2021-12-23 DIAGNOSIS — I700 Atherosclerosis of aorta: Secondary | ICD-10-CM | POA: Diagnosis not present

## 2021-12-23 DIAGNOSIS — R251 Tremor, unspecified: Secondary | ICD-10-CM | POA: Diagnosis not present

## 2021-12-23 DIAGNOSIS — R269 Unspecified abnormalities of gait and mobility: Secondary | ICD-10-CM | POA: Diagnosis not present

## 2021-12-23 DIAGNOSIS — R7303 Prediabetes: Secondary | ICD-10-CM | POA: Diagnosis not present

## 2021-12-23 DIAGNOSIS — F3170 Bipolar disorder, currently in remission, most recent episode unspecified: Secondary | ICD-10-CM | POA: Diagnosis not present

## 2021-12-23 DIAGNOSIS — I951 Orthostatic hypotension: Secondary | ICD-10-CM | POA: Diagnosis not present

## 2022-01-12 ENCOUNTER — Telehealth: Payer: Self-pay | Admitting: Diagnostic Neuroimaging

## 2022-01-12 NOTE — Telephone Encounter (Signed)
Spoke with pt nursing facility. Employee was addiment that pt appointment was 6/21 and had no transportation for pt to make him 6/20 appt.  Would like to be rescheduled before next available in November would like a call back if possible.  [phone]

## 2022-01-18 ENCOUNTER — Ambulatory Visit: Payer: Medicare HMO | Admitting: Diagnostic Neuroimaging

## 2022-01-19 ENCOUNTER — Ambulatory Visit: Payer: Medicare HMO | Admitting: Diagnostic Neuroimaging

## 2022-03-23 DIAGNOSIS — R300 Dysuria: Secondary | ICD-10-CM | POA: Diagnosis not present

## 2022-03-23 DIAGNOSIS — I700 Atherosclerosis of aorta: Secondary | ICD-10-CM | POA: Diagnosis not present

## 2022-03-23 DIAGNOSIS — E782 Mixed hyperlipidemia: Secondary | ICD-10-CM | POA: Diagnosis not present

## 2022-03-23 DIAGNOSIS — R7303 Prediabetes: Secondary | ICD-10-CM | POA: Diagnosis not present

## 2022-03-23 DIAGNOSIS — F331 Major depressive disorder, recurrent, moderate: Secondary | ICD-10-CM | POA: Diagnosis not present

## 2022-03-23 DIAGNOSIS — N1831 Chronic kidney disease, stage 3a: Secondary | ICD-10-CM | POA: Diagnosis not present

## 2022-03-23 DIAGNOSIS — Z0000 Encounter for general adult medical examination without abnormal findings: Secondary | ICD-10-CM | POA: Diagnosis not present

## 2022-03-23 DIAGNOSIS — I739 Peripheral vascular disease, unspecified: Secondary | ICD-10-CM | POA: Diagnosis not present

## 2022-03-23 DIAGNOSIS — F3130 Bipolar disorder, current episode depressed, mild or moderate severity, unspecified: Secondary | ICD-10-CM | POA: Diagnosis not present

## 2022-03-23 DIAGNOSIS — G20 Parkinson's disease: Secondary | ICD-10-CM | POA: Diagnosis not present

## 2022-03-31 DIAGNOSIS — Z0100 Encounter for examination of eyes and vision without abnormal findings: Secondary | ICD-10-CM | POA: Diagnosis not present

## 2022-03-31 DIAGNOSIS — H524 Presbyopia: Secondary | ICD-10-CM | POA: Diagnosis not present

## 2022-04-04 DIAGNOSIS — G20 Parkinson's disease: Secondary | ICD-10-CM | POA: Diagnosis not present

## 2022-04-05 DIAGNOSIS — G20 Parkinson's disease: Secondary | ICD-10-CM | POA: Diagnosis not present

## 2022-04-12 DIAGNOSIS — G20 Parkinson's disease: Secondary | ICD-10-CM | POA: Diagnosis not present

## 2022-04-14 DIAGNOSIS — G20 Parkinson's disease: Secondary | ICD-10-CM | POA: Diagnosis not present

## 2022-04-19 DIAGNOSIS — G20 Parkinson's disease: Secondary | ICD-10-CM | POA: Diagnosis not present

## 2022-04-21 DIAGNOSIS — G20 Parkinson's disease: Secondary | ICD-10-CM | POA: Diagnosis not present

## 2022-04-26 DIAGNOSIS — G20 Parkinson's disease: Secondary | ICD-10-CM | POA: Diagnosis not present

## 2022-04-28 DIAGNOSIS — G20 Parkinson's disease: Secondary | ICD-10-CM | POA: Diagnosis not present

## 2022-05-02 DIAGNOSIS — M6281 Muscle weakness (generalized): Secondary | ICD-10-CM | POA: Diagnosis not present

## 2022-05-04 DIAGNOSIS — M6281 Muscle weakness (generalized): Secondary | ICD-10-CM | POA: Diagnosis not present

## 2022-05-11 DIAGNOSIS — M6281 Muscle weakness (generalized): Secondary | ICD-10-CM | POA: Diagnosis not present

## 2022-05-12 DIAGNOSIS — M6281 Muscle weakness (generalized): Secondary | ICD-10-CM | POA: Diagnosis not present

## 2022-06-10 IMAGING — CT CT HEAD W/O CM
3 series · 14 of 47 positions shown, 16 images · non-contrast
Comparison: 09/26/2019

CLINICAL DATA: Seizure, nontraumatic.

EXAM:
CT HEAD WITHOUT CONTRAST
TECHNIQUE: Contiguous axial images were obtained from the base of the skull
through the vertex without intravenous contrast.

[Series 4: head 5.0 h30s · axial · 0.48mm/px · z∈[+1314,+1459]mm · 8 of 35 slices shown, 10 images]
[im 3/35  brain]
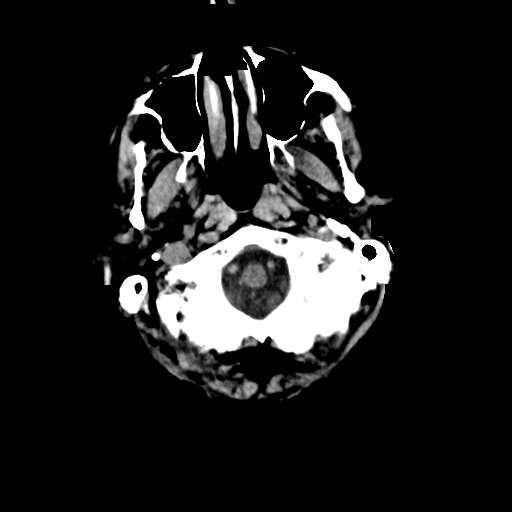
[im 3/35  bone]
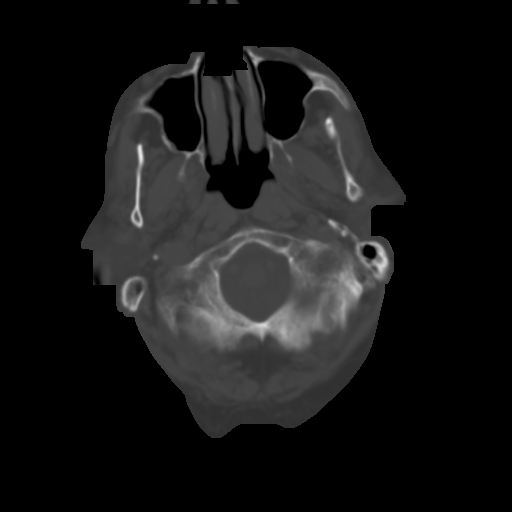
[im 8/35  brain]
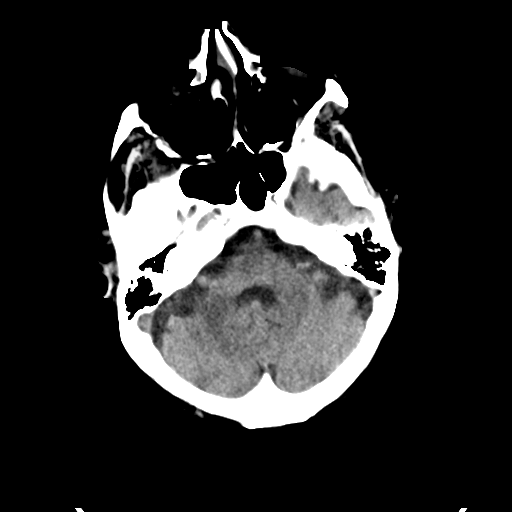
[im 11/35  brain]
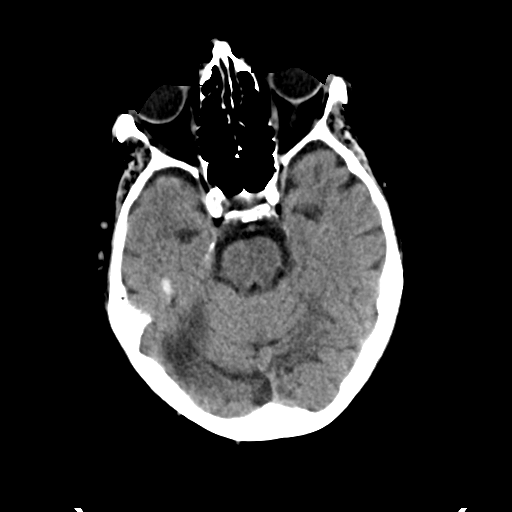
[im 16/35  brain]
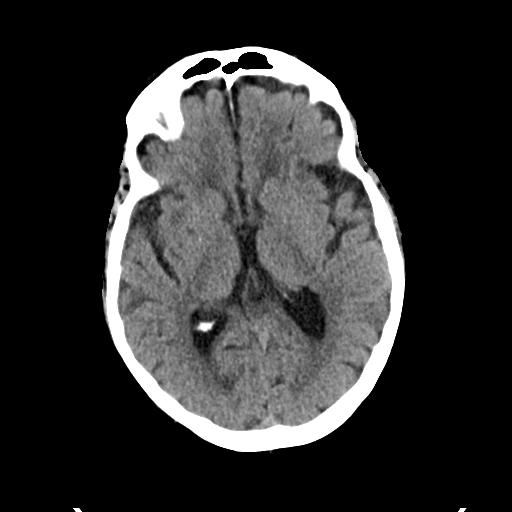
[im 19/35  brain]
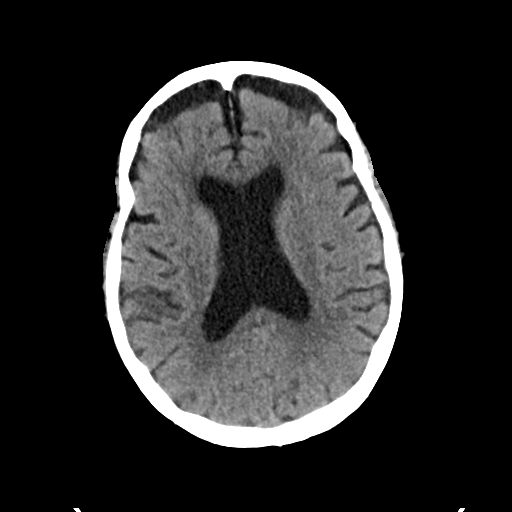
[im 19/35  bone]
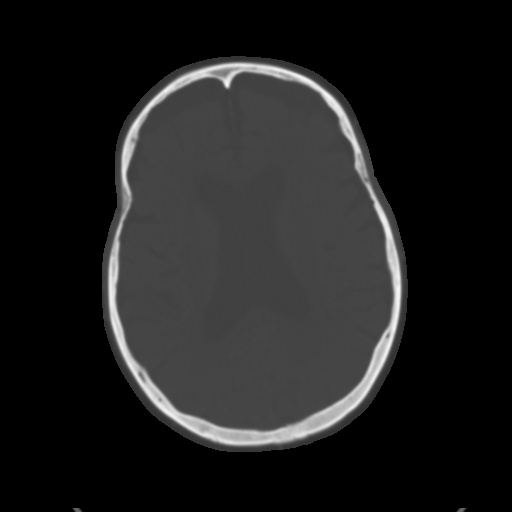
[im 24/35  brain]
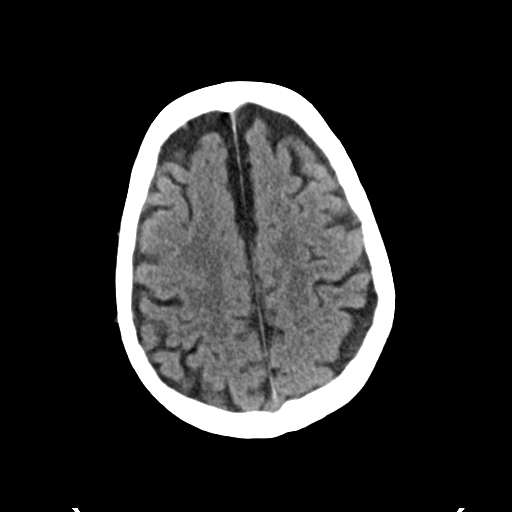
[im 27/35  brain]
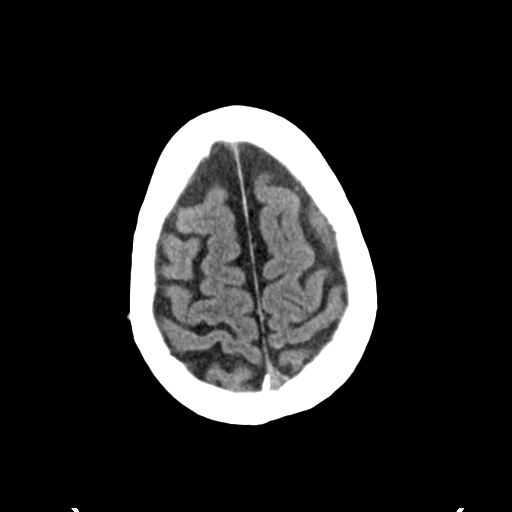
[im 32/35  brain]
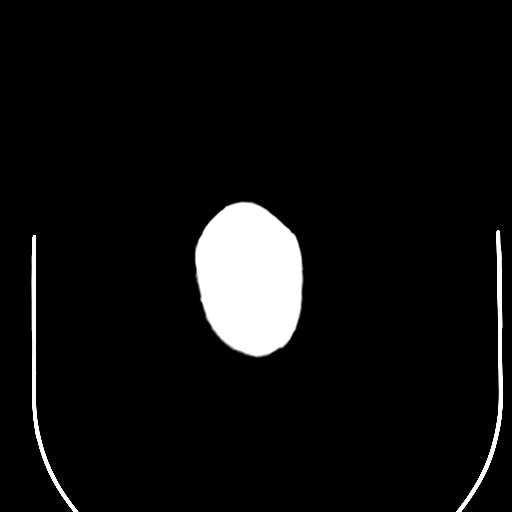

[Series 5: head 3.0 mpr cor · coronal · 0.33mm/px · 3 of 69 slices shown]
[im 23/69  brain]
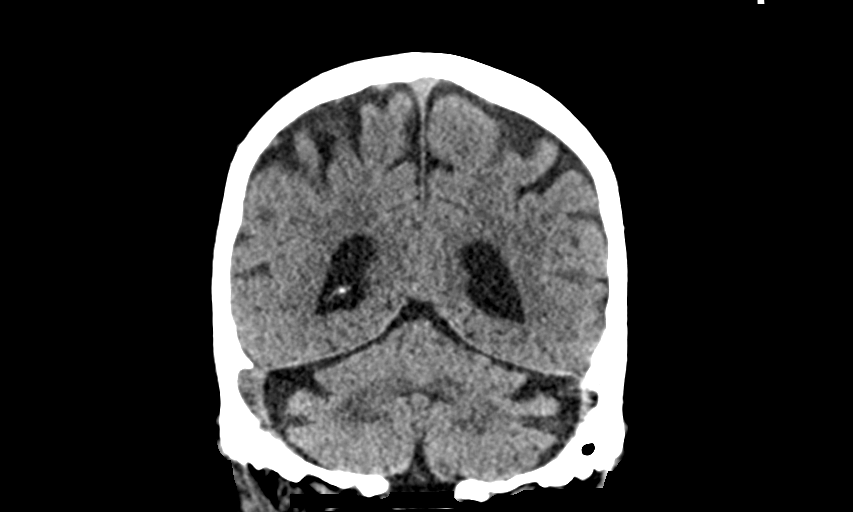
[im 31/69  brain]
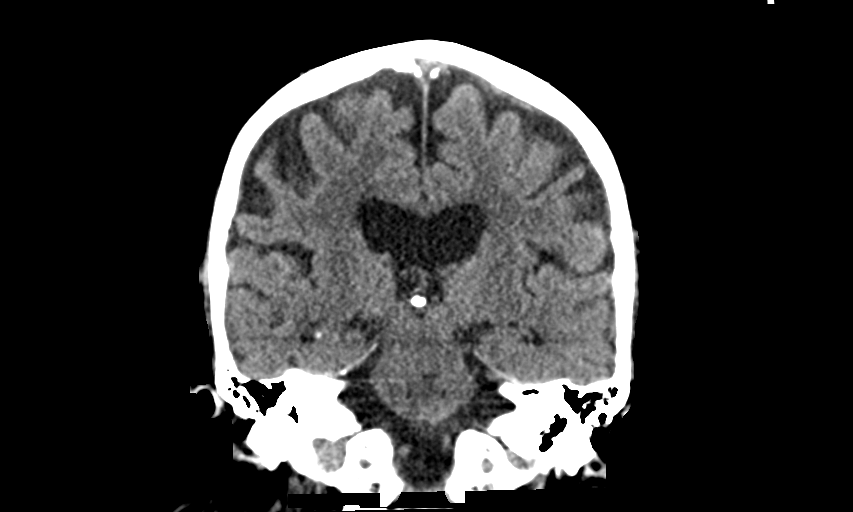
[im 38/69  brain]
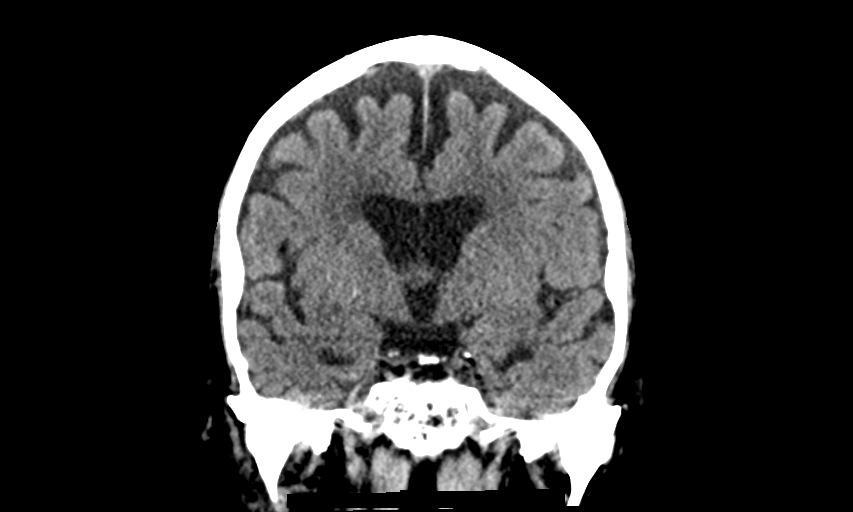

[Series 6: head 3.0 mpr sag · sagittal · 0.33mm/px · 3 of 59 slices shown]
[im 20/59  brain]
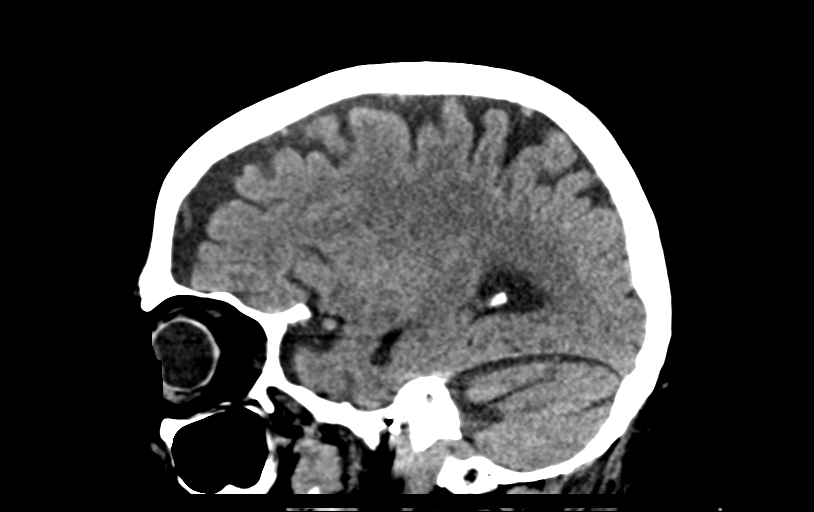
[im 30/59  brain]
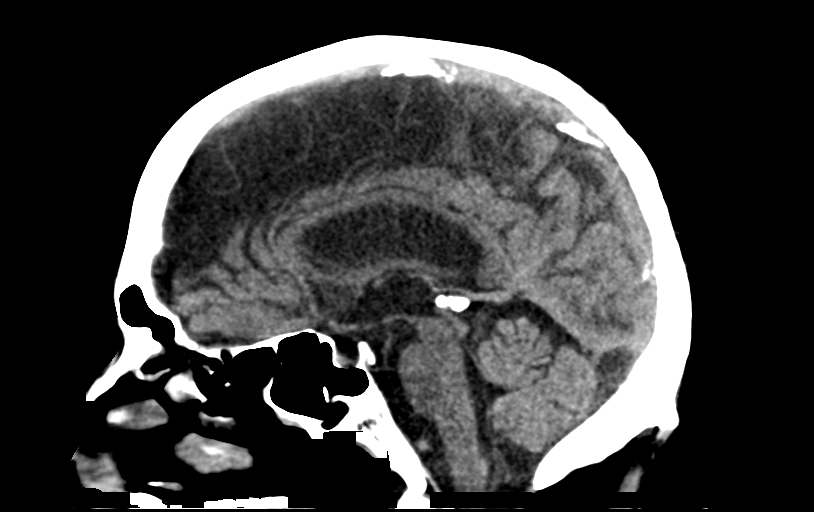
[im 39/59  brain]
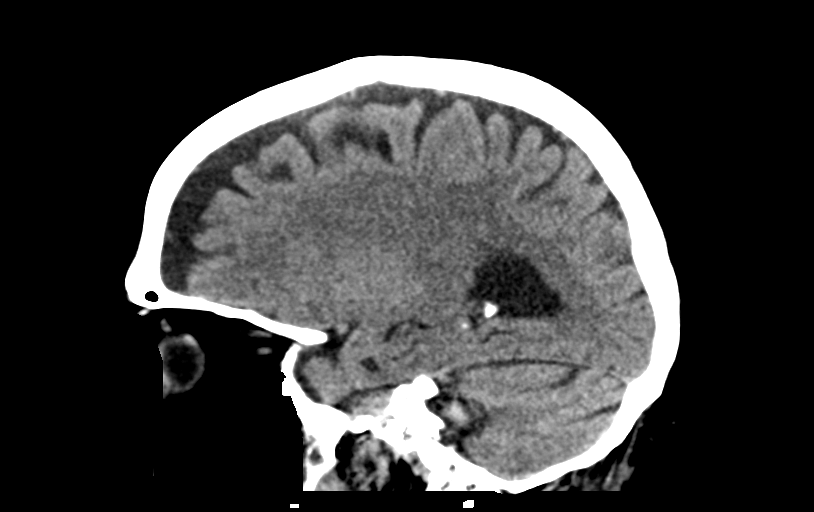

[14 of 47 positions shown; findings below may reference images not displayed]

FINDINGS: Brain: Stable cerebral atrophy. Small amount of low-density in the
white matter is similar to the previous examination. No evidence for
acute hemorrhage, mass lesion, midline shift, hydrocephalus or large
infarct.

Vascular: No hyperdense vessel or unexpected calcification.

Skull: Normal. Negative for fracture or focal lesion.

Sinuses/Orbits: Mild mucosal thickening in the right maxillary
sinus. Mucosal thickening in the ethmoid air cells and frontal
sinuses.

Other: None
IMPRESSION: 1. No acute intracranial abnormality.
2. Stable cerebral atrophy.

## 2022-06-14 ENCOUNTER — Telehealth: Payer: Self-pay | Admitting: Diagnostic Neuroimaging

## 2022-06-14 NOTE — Telephone Encounter (Signed)
Dr Mamie Nick doesn't have any AM availability we can get him. Please r/s with a NP that has AM. Janett Billow has availably first week of Dec.

## 2022-06-14 NOTE — Telephone Encounter (Signed)
Anderson Torboy) asking if can schedule him an appt in mornings with Dr. Leta Baptist. We do not have transportation late in the day. Would like a call from the nurse to discuss date for a morning appt.

## 2022-06-15 DIAGNOSIS — M6281 Muscle weakness (generalized): Secondary | ICD-10-CM | POA: Diagnosis not present

## 2022-06-16 DIAGNOSIS — M6281 Muscle weakness (generalized): Secondary | ICD-10-CM | POA: Diagnosis not present

## 2022-06-16 NOTE — Telephone Encounter (Signed)
The pt has been worked into a slot with Amy, NP (with the assistance of POD 3)for Dec 13th with a 8:00 check in for 8:30 appointment, this is [place]

## 2022-06-20 DIAGNOSIS — M6281 Muscle weakness (generalized): Secondary | ICD-10-CM | POA: Diagnosis not present

## 2022-06-27 ENCOUNTER — Ambulatory Visit: Payer: Medicare HMO | Admitting: Diagnostic Neuroimaging

## 2022-06-27 ENCOUNTER — Encounter: Payer: Self-pay | Admitting: Diagnostic Neuroimaging

## 2022-06-29 DIAGNOSIS — M6281 Muscle weakness (generalized): Secondary | ICD-10-CM | POA: Diagnosis not present

## 2022-07-06 DIAGNOSIS — M6281 Muscle weakness (generalized): Secondary | ICD-10-CM | POA: Diagnosis not present

## 2022-07-09 DIAGNOSIS — R278 Other lack of coordination: Secondary | ICD-10-CM | POA: Diagnosis not present

## 2022-07-09 DIAGNOSIS — R296 Repeated falls: Secondary | ICD-10-CM | POA: Diagnosis not present

## 2022-07-11 DIAGNOSIS — R296 Repeated falls: Secondary | ICD-10-CM | POA: Diagnosis not present

## 2022-07-11 DIAGNOSIS — R278 Other lack of coordination: Secondary | ICD-10-CM | POA: Diagnosis not present
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
## 2022-07-11 NOTE — Patient Instructions (Incomplete)

## 2022-07-12 ENCOUNTER — Encounter (HOSPITAL_COMMUNITY): Payer: Self-pay | Admitting: *Deleted

## 2022-07-12 ENCOUNTER — Emergency Department (HOSPITAL_COMMUNITY): Payer: Medicare HMO

## 2022-07-12 ENCOUNTER — Other Ambulatory Visit: Payer: Self-pay

## 2022-07-12 ENCOUNTER — Emergency Department (HOSPITAL_COMMUNITY)
Admission: EM | Admit: 2022-07-12 | Discharge: 2022-07-12 | Disposition: A | Discharge status: Home / Self Care | Payer: Medicare HMO | Attending: Emergency Medicine | Admitting: Emergency Medicine

## 2022-07-12 DIAGNOSIS — R519 Headache, unspecified: Secondary | ICD-10-CM | POA: Diagnosis present

## 2022-07-12 DIAGNOSIS — Z043 Encounter for examination and observation following other accident: Secondary | ICD-10-CM | POA: Diagnosis not present

## 2022-07-12 DIAGNOSIS — S0181XA Laceration without foreign body of other part of head, initial encounter: Secondary | ICD-10-CM | POA: Insufficient documentation

## 2022-07-12 DIAGNOSIS — M47812 Spondylosis without myelopathy or radiculopathy, cervical region: Secondary | ICD-10-CM | POA: Diagnosis not present

## 2022-07-12 DIAGNOSIS — R0781 Pleurodynia: Secondary | ICD-10-CM | POA: Insufficient documentation

## 2022-07-12 DIAGNOSIS — S0990XA Unspecified injury of head, initial encounter: Secondary | ICD-10-CM

## 2022-07-12 DIAGNOSIS — Y92129 Unspecified place in nursing home as the place of occurrence of the external cause: Secondary | ICD-10-CM | POA: Insufficient documentation

## 2022-07-12 DIAGNOSIS — W1830XA Fall on same level, unspecified, initial encounter: Secondary | ICD-10-CM | POA: Diagnosis not present

## 2022-07-12 DIAGNOSIS — I771 Stricture of artery: Secondary | ICD-10-CM | POA: Diagnosis not present

## 2022-07-12 DIAGNOSIS — M25539 Pain in unspecified wrist: Secondary | ICD-10-CM | POA: Diagnosis not present

## 2022-07-12 DIAGNOSIS — M25531 Pain in right wrist: Secondary | ICD-10-CM | POA: Diagnosis not present

## 2022-07-12 DIAGNOSIS — S2241XA Multiple fractures of ribs, right side, initial encounter for closed fracture: Secondary | ICD-10-CM | POA: Diagnosis not present

## 2022-07-12 DIAGNOSIS — H02849 Edema of unspecified eye, unspecified eyelid: Secondary | ICD-10-CM | POA: Diagnosis not present

## 2022-07-12 DIAGNOSIS — W19XXXA Unspecified fall, initial encounter: Secondary | ICD-10-CM

## 2022-07-12 DIAGNOSIS — R220 Localized swelling, mass and lump, head: Secondary | ICD-10-CM | POA: Diagnosis not present

## 2022-07-12 DIAGNOSIS — I77810 Thoracic aortic ectasia: Secondary | ICD-10-CM | POA: Diagnosis not present

## 2022-07-12 DIAGNOSIS — R58 Hemorrhage, not elsewhere classified: Secondary | ICD-10-CM | POA: Diagnosis not present

## 2022-07-12 LAB — CBC WITH DIFFERENTIAL/PLATELET
Abs Immature Granulocytes: 0.03 10*3/uL (ref 0.00–0.07)
Basophils Absolute: 0 10*3/uL (ref 0.0–0.1)
Basophils Relative: 0 %
Eosinophils Absolute: 0 10*3/uL (ref 0.0–0.5)
Eosinophils Relative: 0 %
HCT: 38 % — ABNORMAL LOW (ref 39.0–52.0)
Hemoglobin: 11.9 g/dL — ABNORMAL LOW (ref 13.0–17.0)
Immature Granulocytes: 0 %
Lymphocytes Relative: 8 %
Lymphs Abs: 0.7 10*3/uL (ref 0.7–4.0)
MCH: 28.4 pg (ref 26.0–34.0)
MCHC: 31.3 g/dL (ref 30.0–36.0)
MCV: 90.7 fL (ref 80.0–100.0)
Monocytes Absolute: 0.6 10*3/uL (ref 0.1–1.0)
Monocytes Relative: 8 %
Neutro Abs: 7.1 10*3/uL (ref 1.7–7.7)
Neutrophils Relative %: 84 %
Platelets: 176 10*3/uL (ref 150–400)
RBC: 4.19 MIL/uL — ABNORMAL LOW (ref 4.22–5.81)
RDW: 13.1 % (ref 11.5–15.5)
WBC: 8.5 10*3/uL (ref 4.0–10.5)
nRBC: 0 % (ref 0.0–0.2)

## 2022-07-12 LAB — COMPREHENSIVE METABOLIC PANEL
ALT: 6 U/L (ref 0–44)
AST: 17 U/L (ref 15–41)
Albumin: 4.4 g/dL (ref 3.5–5.0)
Alkaline Phosphatase: 75 U/L (ref 38–126)
Anion gap: 9 (ref 5–15)
BUN: 36 mg/dL — ABNORMAL HIGH (ref 8–23)
CO2: 26 mmol/L (ref 22–32)
Calcium: 11 mg/dL — ABNORMAL HIGH (ref 8.9–10.3)
Chloride: 102 mmol/L (ref 98–111)
Creatinine, Ser: 1.61 mg/dL — ABNORMAL HIGH (ref 0.61–1.24)
GFR, Estimated: 43 mL/min — ABNORMAL LOW (ref >60)
Glucose, Bld: 111 mg/dL — ABNORMAL HIGH (ref 70–99)
Potassium: 4.8 mmol/L (ref 3.5–5.1)
Sodium: 137 mmol/L (ref 135–145)
Total Bilirubin: 0.7 mg/dL (ref 0.3–1.2)
Total Protein: 8 g/dL (ref 6.5–8.1)

## 2022-07-12 LAB — PROTIME-INR
INR: 1 (ref 0.8–1.2)
Prothrombin Time: 13.5 seconds (ref 11.4–15.2)

## 2022-07-12 MED ORDER — HYDROGEN PEROXIDE 3 % EX SOLN
CUTANEOUS | Status: DC
Start: 2022-07-12 — End: 2022-07-12
  Filled 2022-07-12: qty 473

## 2022-07-12 MED ORDER — BACITRACIN ZINC 500 UNIT/GM EX OINT
TOPICAL_OINTMENT | CUTANEOUS | Status: AC
  Filled 2022-07-12: qty 1.8
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
## 2022-07-12 NOTE — ED Triage Notes (Signed)
Pt brought in by [facility] for c/o fall; pt is not sure of how he fell and what his head hit with the fall;   Pt has significant bruising and swelling to right eye and denies any blurry vision  Pt also c/o some right rib pain. No obvious deformity noted

## 2022-07-12 NOTE — ED Provider Notes (Signed)
[facility] EMERGENCY DEPARTMENT Provider Note   CSN: [id number] Arrival date & time: 07/12/22  1218     History  Chief Complaint  Patient presents with   Fernando Carr is a 81 y.o. male.  HPI Presents via EMS after 9 1 fall occurred at his nursing facility.  Patient is awake and alert, sitting upright.  Parkinson's, and slow speech, but seems to answer questions appropriately.  No clear last seen normal.  Patient was found on the ground by nursing staff.  Patient complains of pain in his right forehead, right ribs and right wrist.    Home Medications Prior to Admission medications   Medication Sig Start Date End Date Taking? Authorizing Provider  acetaminophen (TYLENOL) 325 MG tablet Take 2 tablets (650 mg total) by mouth every 6 (six) hours as needed for mild pain, fever or headache. 08/25/21   Georgette Shell, MD  carbidopa-levodopa (SINEMET IR) 25-100 MG tablet Take 1 tablet by mouth 3 (three) times daily. 04/23/21   Medina-Vargas, Monina C, NP  doxycycline (VIBRAMYCIN) 100 MG capsule Take 1 capsule (100 mg total) by mouth 2 (two) times daily. 11/17/21   Triplett, Tammy, PA-C  fenofibrate 54 MG tablet Take 1 tablet (54 mg total) by mouth daily. 04/23/21   Medina-Vargas, Monina C, NP  midodrine (PROAMATINE) 10 MG tablet Take 1 tablet (10 mg total) by mouth 3 (three) times daily with meals. 08/25/21   Georgette Shell, MD  predniSONE (DELTASONE) 20 MG tablet Take 2 tablets (40 mg total) by mouth daily. 11/17/21   Triplett, Tammy, PA-C  risperiDONE (RISPERDAL) 0.5 MG tablet Take 1 tablet (0.5 mg total) by mouth at bedtime. 09/09/21   [name], [name], MD  sertraline (ZOLOFT) 100 MG tablet Take 1 tablet (100 mg total) by mouth daily. 09/09/21   [name], [name], MD  simvastatin (ZOCOR) 10 MG tablet Take 1 tablet (10 mg total) by mouth daily at 6 PM. 04/23/21   Medina-Vargas, Monina C, NP      Allergies    Patient has no known allergies.    Review of Systems   Review of Systems   Unable to perform ROS: Acuity of condition    Physical Exam Updated Vital Signs BP (!) 144/88 (BP Location: Right Arm)   Pulse 69   Temp 97.8 F (36.6 C) (Oral)   Resp 18   Ht 6' (1.829 m)   Wt 90.7 kg   SpO2 100%   BMI 27.12 kg/m  Physical Exam Vitals and nursing note reviewed.  Constitutional:      General: He is not in acute distress.    Appearance: He is well-developed. He is ill-appearing. He is not toxic-appearing.  HENT:     Head: Normocephalic.   Eyes:     Conjunctiva/sclera: Conjunctivae normal.  Cardiovascular:     Rate and Rhythm: Normal rate and regular rhythm.  Pulmonary:     Effort: Pulmonary effort is normal. No respiratory distress.     Breath sounds: No stridor.  Abdominal:     General: There is no distension.  Skin:    General: Skin is warm and dry.  Neurological:     Mental Status: He is alert and oriented to person, place, and time.     Motor: Weakness, tremor and atrophy present.     ED Results / Procedures / Treatments   Labs (all labs ordered are listed, but only abnormal results are displayed) Labs Reviewed  COMPREHENSIVE METABOLIC PANEL -  Abnormal; Notable for the following components:      Result Value   Glucose, Bld 111 (*)    BUN 36 (*)    Creatinine, Ser 1.61 (*)    Calcium 11.0 (*)    GFR, Estimated 43 (*)    All other components within normal limits  CBC WITH DIFFERENTIAL/PLATELET - Abnormal; Notable for the following components:   RBC 4.19 (*)    Hemoglobin 11.9 (*)    HCT 38.0 (*)    All other components within normal limits  PROTIME-INR    EKG None  Radiology DG Ribs Unilateral W/Chest Right  Result Date: 07/12/2022 CLINICAL DATA:  Trauma, fall EXAM: RIGHT RIBS AND CHEST - 3+ VIEW COMPARISON:  11/22/2021 FINDINGS: Minimally displaced recent fractures are seen in the lateral aspects of right fifth, sixth and seventh ribs. There is comminuted fracture in the anterior end of right tenth rib. Cardiac size is within  normal limits. Thoracic aorta is tortuous and ectatic. There are no signs of pulmonary edema or focal pulmonary consolidation. There is no pleural effusion or pneumothorax. IMPRESSION: Recent minimally displaced fractures are seen in right fifth, sixth, seventh and tenth ribs. There is no focal pulmonary consolidation. There is no pleural effusion or pneumothorax. Electronically Signed   By: Elmer Picker M.D.   On: 07/12/2022 13:37   CT Head Wo Contrast  Result Date: 07/12/2022 CLINICAL DATA:  Fall EXAM: CT HEAD WITHOUT CONTRAST CT MAXILLOFACIAL WITHOUT CONTRAST CT CERVICAL SPINE WITHOUT CONTRAST TECHNIQUE: Multidetector CT imaging of the head, cervical spine, and maxillofacial structures were performed using the standard protocol without intravenous contrast. Multiplanar CT image reconstructions of the cervical spine and maxillofacial structures were also generated. RADIATION DOSE REDUCTION: This exam was performed according to the departmental dose-optimization program which includes automated exposure control, adjustment of the mA and/or kV according to patient size and/or use of iterative reconstruction technique. COMPARISON:  CT head and cervical spine 08/11/2021 FINDINGS: CT HEAD FINDINGS Brain: There is a small focus of hyperdensity along the left aspect of the falx over the frontal lobe which could reflect trace subdural hemorrhage, not present on the prior study from January (4-21). There is no other evidence of acute intracranial hemorrhage or extra-axial fluid collection. There is no acute territorial infarct. There is mild background parenchymal volume loss. The ventricles are stable in size. Gray-white differentiation is preserved. There is mild chronic small-vessel ischemic change. There is no mass lesion.  There is no mass effect or midline shift. Vascular: There is calcification of the bilateral carotid siphons. Skull: Normal. Negative for fracture or focal lesion. Other: None. CT  MAXILLOFACIAL FINDINGS Osseous: There is no acute facial bone fracture. There is no suspicious osseous lesion. There is no evidence of mandibular dislocation. Orbits: Bilateral lens implants are in place. The globes are intact. There is no retrobulbar hematoma. Sinuses: Clear. Soft tissues: There is right periorbital soft tissue swelling. Other: There is dental disease with multiple dental caries and periapical lucencies, most notably periapical lucency around the root of the right maxillary first premolar. CT CERVICAL SPINE FINDINGS Alignment: There is no significant antero or retrolisthesis. There is no jumped or perched facet or other evidence of traumatic malalignment. Skull base and vertebrae: Skull base alignment is maintained. Vertebral body heights are preserved. There is no evidence of acute fracture. There is no suspicious osseous lesion. Soft tissues and spinal canal: No prevertebral fluid or swelling. No visible canal hematoma. Disc levels: There is multilevel disc space narrowing and degenerative  endplate change, most advanced at C3-C4, C4-C5, and C6-C7. There is degenerative pannus about the dens without evidence of mass effect on the underlying cord. There is multilevel facet arthropathy. There is up to mild-to-moderate spinal canal stenosis at C3-C4. Findings are overall unchanged. No Upper chest: The imaged lung apices are clear. Other: None. IMPRESSION: 1. Suspect trace left parafalcine subdural hemorrhage. 2. Right periorbital soft tissue swelling with no acute facial bone fracture. 3. No acute fracture or traumatic malalignment of the cervical spine. Critical Value/emergent results were called by telephone at the time of interpretation on 07/12/2022 at 1:31 pm to provider Carmin Muskrat , who verbally acknowledged these results. Electronically Signed   By: Valetta Mole M.D.   On: 07/12/2022 13:36   CT Maxillofacial WO CM  Result Date: 07/12/2022 CLINICAL DATA:  Fall EXAM: CT HEAD WITHOUT  CONTRAST CT MAXILLOFACIAL WITHOUT CONTRAST CT CERVICAL SPINE WITHOUT CONTRAST TECHNIQUE: Multidetector CT imaging of the head, cervical spine, and maxillofacial structures were performed using the standard protocol without intravenous contrast. Multiplanar CT image reconstructions of the cervical spine and maxillofacial structures were also generated. RADIATION DOSE REDUCTION: This exam was performed according to the departmental dose-optimization program which includes automated exposure control, adjustment of the mA and/or kV according to patient size and/or use of iterative reconstruction technique. COMPARISON:  CT head and cervical spine 08/11/2021 FINDINGS: CT HEAD FINDINGS Brain: There is a small focus of hyperdensity along the left aspect of the falx over the frontal lobe which could reflect trace subdural hemorrhage, not present on the prior study from January (4-21). There is no other evidence of acute intracranial hemorrhage or extra-axial fluid collection. There is no acute territorial infarct. There is mild background parenchymal volume loss. The ventricles are stable in size. Gray-white differentiation is preserved. There is mild chronic small-vessel ischemic change. There is no mass lesion.  There is no mass effect or midline shift. Vascular: There is calcification of the bilateral carotid siphons. Skull: Normal. Negative for fracture or focal lesion. Other: None. CT MAXILLOFACIAL FINDINGS Osseous: There is no acute facial bone fracture. There is no suspicious osseous lesion. There is no evidence of mandibular dislocation. Orbits: Bilateral lens implants are in place. The globes are intact. There is no retrobulbar hematoma. Sinuses: Clear. Soft tissues: There is right periorbital soft tissue swelling. Other: There is dental disease with multiple dental caries and periapical lucencies, most notably periapical lucency around the root of the right maxillary first premolar. CT CERVICAL SPINE FINDINGS  Alignment: There is no significant antero or retrolisthesis. There is no jumped or perched facet or other evidence of traumatic malalignment. Skull base and vertebrae: Skull base alignment is maintained. Vertebral body heights are preserved. There is no evidence of acute fracture. There is no suspicious osseous lesion. Soft tissues and spinal canal: No prevertebral fluid or swelling. No visible canal hematoma. Disc levels: There is multilevel disc space narrowing and degenerative endplate change, most advanced at C3-C4, C4-C5, and C6-C7. There is degenerative pannus about the dens without evidence of mass effect on the underlying cord. There is multilevel facet arthropathy. There is up to mild-to-moderate spinal canal stenosis at C3-C4. Findings are overall unchanged. No Upper chest: The imaged lung apices are clear. Other: None. IMPRESSION: 1. Suspect trace left parafalcine subdural hemorrhage. 2. Right periorbital soft tissue swelling with no acute facial bone fracture. 3. No acute fracture or traumatic malalignment of the cervical spine. Critical Value/emergent results were called by telephone at the time of interpretation on 07/12/2022  at 1:31 pm to provider Carmin Muskrat , who verbally acknowledged these results. Electronically Signed   By: Valetta Mole M.D.   On: 07/12/2022 13:36   CT Cervical Spine Wo Contrast  Result Date: 07/12/2022 CLINICAL DATA:  Fall EXAM: CT HEAD WITHOUT CONTRAST CT MAXILLOFACIAL WITHOUT CONTRAST CT CERVICAL SPINE WITHOUT CONTRAST TECHNIQUE: Multidetector CT imaging of the head, cervical spine, and maxillofacial structures were performed using the standard protocol without intravenous contrast. Multiplanar CT image reconstructions of the cervical spine and maxillofacial structures were also generated. RADIATION DOSE REDUCTION: This exam was performed according to the departmental dose-optimization program which includes automated exposure control, adjustment of the mA and/or kV  according to patient size and/or use of iterative reconstruction technique. COMPARISON:  CT head and cervical spine 08/11/2021 FINDINGS: CT HEAD FINDINGS Brain: There is a small focus of hyperdensity along the left aspect of the falx over the frontal lobe which could reflect trace subdural hemorrhage, not present on the prior study from January (4-21). There is no other evidence of acute intracranial hemorrhage or extra-axial fluid collection. There is no acute territorial infarct. There is mild background parenchymal volume loss. The ventricles are stable in size. Gray-white differentiation is preserved. There is mild chronic small-vessel ischemic change. There is no mass lesion.  There is no mass effect or midline shift. Vascular: There is calcification of the bilateral carotid siphons. Skull: Normal. Negative for fracture or focal lesion. Other: None. CT MAXILLOFACIAL FINDINGS Osseous: There is no acute facial bone fracture. There is no suspicious osseous lesion. There is no evidence of mandibular dislocation. Orbits: Bilateral lens implants are in place. The globes are intact. There is no retrobulbar hematoma. Sinuses: Clear. Soft tissues: There is right periorbital soft tissue swelling. Other: There is dental disease with multiple dental caries and periapical lucencies, most notably periapical lucency around the root of the right maxillary first premolar. CT CERVICAL SPINE FINDINGS Alignment: There is no significant antero or retrolisthesis. There is no jumped or perched facet or other evidence of traumatic malalignment. Skull base and vertebrae: Skull base alignment is maintained. Vertebral body heights are preserved. There is no evidence of acute fracture. There is no suspicious osseous lesion. Soft tissues and spinal canal: No prevertebral fluid or swelling. No visible canal hematoma. Disc levels: There is multilevel disc space narrowing and degenerative endplate change, most advanced at C3-C4, C4-C5, and  C6-C7. There is degenerative pannus about the dens without evidence of mass effect on the underlying cord. There is multilevel facet arthropathy. There is up to mild-to-moderate spinal canal stenosis at C3-C4. Findings are overall unchanged. No Upper chest: The imaged lung apices are clear. Other: None. IMPRESSION: 1. Suspect trace left parafalcine subdural hemorrhage. 2. Right periorbital soft tissue swelling with no acute facial bone fracture. 3. No acute fracture or traumatic malalignment of the cervical spine. Critical Value/emergent results were called by telephone at the time of interpretation on 07/12/2022 at 1:31 pm to provider Carmin Muskrat , who verbally acknowledged these results. Electronically Signed   By: Valetta Mole M.D.   On: 07/12/2022 13:36   DG Wrist Complete Right  Result Date: 07/12/2022 CLINICAL DATA:  Fall. EXAM: RIGHT WRIST - COMPLETE 3+ VIEW COMPARISON:  None Available. FINDINGS: No distal radius or ulnar fracture. Radiocarpal joint is intact. No carpal fracture. No soft tissue abnormality. IMPRESSION: No fracture or dislocation. Electronically Signed   By: Suzy Bouchard M.D.   On: 07/12/2022 13:35    Procedures Procedures    Medications Ordered  in ED Medications  bacitracin 500 UNIT/GM ointment (has no administration in time range)  hydrogen peroxide 3 % external solution (has no administration in time range)    ED Course/ Medical Decision Making/ A&P This patient with a Hx of Parkinson's disease, multiple other medical problems presents to the ED for concern of fall, pain in multiple areas, this involves an extensive number of treatment options, and is a complaint that carries with it a high risk of complications and morbidity.    The differential diagnosis includes causes of the fall including neurologic degeneration, dehydration, illness, and sequela of the fall including fracture, intracranial hemorrhage   Social Determinants of Health:  Age, Parkinson's  disease  Additional history obtained:  Additional history and/or information obtained from EMS, notable for HPI   After the initial evaluation, orders, including: CT x-ray labs were initiated.    On repeat evaluation of the patient stayed the same  Lab Tests:  I personally interpreted labs.  The pertinent results include: Labs consistent with prior, mild renal abnormality  Imaging Studies ordered:  I independently visualized and interpreted imaging which showed head CT with subdural hematoma, I discussed it with neurosurgery and with radiology I agree with the radiologist interpretation  Consultations Obtained:  I requested consultation with the neurosurgery,  and discussed lab and imaging findings as well as pertinent plan - they recommend: Dr. Richmond Campbell recommends no hospitalization, no need for repeat imaging today, patient can discharge and follow-up in the clinic  Dispostion / Final MDM:  After consideration of the diagnostic results and the patient's response to treatment, this adult male with Parkinson's disease, weakness at baseline, frequent falls presents after a witnessed fall.  Patient does have facial trauma, though nothing requiring repair.  Patient's labs consistent with prior. Patient found to have small parafalcine subdural hematoma, which I discussed with neurosurgery and with radiology.  No indication for additional imaging, admission for this according to our neurosurgery colleague.  Hospitalization was a consideration given his falls, weakness, but patient is in a monitored facility, was discharged in stable condition.  Final Clinical Impression(s) / ED Diagnoses Final diagnoses:  Fall, initial encounter  Injury of head, initial encounter    Rx / DC Orders ED Discharge Orders     None         Carmin Muskrat, MD 07/12/22 1525

## 2022-07-12 NOTE — ED Notes (Signed)
Pt's forehead and right cheek cleaned with peroxide and bacitracin applied with bandaid; EDP made aware

## 2022-07-12 NOTE — ED Notes (Signed)
Spoke with pt's sister and informed her pt is being discharged and pt has no broken bones; pt has small subdural hematoma and informed her I would let the facility know what to look for  Spoke with staff from [facility] and told her pt has a small subdural hematoma and pt is stable to be discharged; she verbalized understanding and stated someone would be here to pick up ot  Pt given cup of water

## 2022-07-13 ENCOUNTER — Ambulatory Visit: Payer: Medicare HMO | Admitting: Family Medicine

## 2022-07-13 DIAGNOSIS — R278 Other lack of coordination: Secondary | ICD-10-CM | POA: Diagnosis not present

## 2022-07-13 DIAGNOSIS — G20A1 Parkinson's disease without dyskinesia, without mention of fluctuations: Secondary | ICD-10-CM

## 2022-07-13 DIAGNOSIS — R296 Repeated falls: Secondary | ICD-10-CM | POA: Diagnosis not present

## 2022-07-18 DIAGNOSIS — R296 Repeated falls: Secondary | ICD-10-CM | POA: Diagnosis not present

## 2022-07-18 DIAGNOSIS — M6281 Muscle weakness (generalized): Secondary | ICD-10-CM | POA: Diagnosis not present
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
## 2022-07-19 NOTE — Patient Instructions (Signed)
Below is our plan:  We will continue carb/levo 25-100mg  three times daily. Monitor symptoms closely. Keep a close eye on your weight. Make sure to eat three well balanced meals with three snacks daily. Remember to request extra servings if you wish. Stay active. Continue PT as much as offered. Use Rolator at all times. Play brain games regularly.   Please make sure you are staying well hydrated. I recommend 50-60 ounces daily. Well balanced diet and regular exercise encouraged. Consistent sleep schedule with 6-8 hours recommended.   Please continue follow up with care team as directed.   Follow up with me in 1 year   You may receive a survey regarding today's visit. I encourage you to leave honest feed back as I do use this information to improve patient care. Thank you for seeing me today!   Management of Memory Problems   There are some general things you can do to help manage your memory problems.  Your memory may not in fact recover, but by using techniques and strategies you will be able to manage your memory difficulties better.   1)  Establish a routine. Try to establish and then stick to a regular routine.  By doing this, you will get used to what to expect and you will reduce the need to rely on your memory.  Also, try to do things at the same time of day, such as taking your medication or checking your calendar first thing in the morning. Think about think that you can do as a part of a regular routine and make a list.  Then enter them into a daily planner to remind you.  This will help you establish a routine.   2)  Organize your environment. Organize your environment so that it is uncluttered.  Decrease visual stimulation.  Place everyday items such as keys or cell phone in the same place every day (ie.  Basket next to front door) Use post it notes with a brief message to yourself (ie. Turn off light, lock the door) Use labels to indicate where things go (ie. Which cupboards are  for food, dishes, etc.) Keep a notepad and pen by the telephone to take messages   3)  Memory Aids A diary or journal/notebook/daily planner Making a list (shopping list, chore list, to do list that needs to be done) Using an alarm as a reminder (kitchen timer or cell phone alarm) Using cell phone to store information (Notes, Calendar, Reminders) Calendar/White board placed in a prominent position Post-it notes   In order for memory aids to be useful, you need to have good habits.  It's no good remembering to make a note in your journal if you don't remember to look in it.  Try setting aside a certain time of day to look in journal.   4)  Improving mood and managing fatigue. There may be other factors that contribute to memory difficulties.  Factors, such as anxiety, depression and tiredness can affect memory. Regular gentle exercise can help improve your mood and give you more energy. Exercise: there are short videos created by the [employer] on Health specially for older adults: https://bit.ly/2I30q97.  Mediterranean diet: which emphasizes fruits, vegetables, whole grains, legumes, fish, and other seafood; unsaturated fats such as olive oils; and low amounts of red meat, eggs, and sweets. A variation of this, called MIND [facility] Intervention for Neurodegenerative Delay) incorporates the DASH (Dietary Approaches to Stop Hypertension) diet, which has been shown to lower high blood  pressure, a risk factor for Alzheimer's disease. More information at: ExitMarketing.de.  Aerobic exercise that improve heart health is also good for the mind.  [employer] on Aging have short videos for exercises that you can do at home: BlindWorkshop.com.pt Simple relaxation techniques may help relieve symptoms of anxiety Try to get back to completing activities or hobbies you enjoyed doing in the past. Learn to  pace yourself through activities to decrease fatigue. Find out about some local support groups where you can share experiences with others. Try and achieve 7-8 hours of sleep at night.

## 2022-07-20 DIAGNOSIS — I739 Peripheral vascular disease, unspecified: Secondary | ICD-10-CM | POA: Diagnosis not present

## 2022-07-20 DIAGNOSIS — I700 Atherosclerosis of aorta: Secondary | ICD-10-CM | POA: Diagnosis not present

## 2022-07-20 DIAGNOSIS — M6281 Muscle weakness (generalized): Secondary | ICD-10-CM | POA: Diagnosis not present

## 2022-07-20 DIAGNOSIS — I951 Orthostatic hypotension: Secondary | ICD-10-CM | POA: Diagnosis not present

## 2022-07-20 DIAGNOSIS — E782 Mixed hyperlipidemia: Secondary | ICD-10-CM | POA: Diagnosis not present

## 2022-07-20 DIAGNOSIS — R296 Repeated falls: Secondary | ICD-10-CM | POA: Diagnosis not present

## 2022-07-20 DIAGNOSIS — N1831 Chronic kidney disease, stage 3a: Secondary | ICD-10-CM | POA: Diagnosis not present

## 2022-07-20 DIAGNOSIS — R7303 Prediabetes: Secondary | ICD-10-CM | POA: Diagnosis not present

## 2022-07-20 DIAGNOSIS — F3170 Bipolar disorder, currently in remission, most recent episode unspecified: Secondary | ICD-10-CM | POA: Diagnosis not present

## 2022-07-20 DIAGNOSIS — S0083XA Contusion of other part of head, initial encounter: Secondary | ICD-10-CM | POA: Diagnosis not present

## 2022-07-20 DIAGNOSIS — S2239XA Fracture of one rib, unspecified side, initial encounter for closed fracture: Secondary | ICD-10-CM | POA: Diagnosis not present

## 2022-07-21 ENCOUNTER — Encounter: Payer: Self-pay | Admitting: Family Medicine

## 2022-07-21 ENCOUNTER — Ambulatory Visit (INDEPENDENT_AMBULATORY_CARE_PROVIDER_SITE_OTHER): Payer: Medicare HMO | Admitting: Family Medicine

## 2022-07-21 VITALS — BP 122/65 | HR 58 | Ht 72.0 in | Wt 188.0 lb

## 2022-07-21 DIAGNOSIS — G20A1 Parkinson's disease without dyskinesia, without mention of fluctuations: Secondary | ICD-10-CM

## 2022-07-21 DIAGNOSIS — G252 Other specified forms of tremor: Secondary | ICD-10-CM | POA: Diagnosis not present

## 2022-07-21 DIAGNOSIS — W19XXXD Unspecified fall, subsequent encounter: Secondary | ICD-10-CM

## 2022-07-21 MED ORDER — CARBIDOPA-LEVODOPA 25-100 MG PO TABS: 1.0000 | ORAL_TABLET | Freq: Three times a day (TID) | ORAL | 3 refills | Status: DC

## 2022-07-22 DIAGNOSIS — R296 Repeated falls: Secondary | ICD-10-CM | POA: Diagnosis not present

## 2022-07-22 DIAGNOSIS — R278 Other lack of coordination: Secondary | ICD-10-CM | POA: Diagnosis not present

## 2022-07-26 DIAGNOSIS — R296 Repeated falls: Secondary | ICD-10-CM | POA: Diagnosis not present

## 2022-07-26 DIAGNOSIS — M6281 Muscle weakness (generalized): Secondary | ICD-10-CM | POA: Diagnosis not present

## 2022-07-27 DIAGNOSIS — R278 Other lack of coordination: Secondary | ICD-10-CM | POA: Diagnosis not present

## 2022-07-27 DIAGNOSIS — R296 Repeated falls: Secondary | ICD-10-CM | POA: Diagnosis not present

## 2022-08-02 DIAGNOSIS — R296 Repeated falls: Secondary | ICD-10-CM | POA: Diagnosis not present

## 2022-08-02 DIAGNOSIS — M6281 Muscle weakness (generalized): Secondary | ICD-10-CM | POA: Diagnosis not present

## 2022-08-04 DIAGNOSIS — R278 Other lack of coordination: Secondary | ICD-10-CM | POA: Diagnosis not present

## 2022-08-04 DIAGNOSIS — R296 Repeated falls: Secondary | ICD-10-CM | POA: Diagnosis not present

## 2022-08-05 DIAGNOSIS — R296 Repeated falls: Secondary | ICD-10-CM | POA: Diagnosis not present

## 2022-08-05 DIAGNOSIS — M6281 Muscle weakness (generalized): Secondary | ICD-10-CM | POA: Diagnosis not present

## 2022-08-08 DIAGNOSIS — R296 Repeated falls: Secondary | ICD-10-CM | POA: Diagnosis not present

## 2022-08-08 DIAGNOSIS — R278 Other lack of coordination: Secondary | ICD-10-CM | POA: Diagnosis not present

## 2022-08-09 DIAGNOSIS — M6281 Muscle weakness (generalized): Secondary | ICD-10-CM | POA: Diagnosis not present

## 2022-08-09 DIAGNOSIS — R296 Repeated falls: Secondary | ICD-10-CM | POA: Diagnosis not present

## 2022-08-11 DIAGNOSIS — M6281 Muscle weakness (generalized): Secondary | ICD-10-CM | POA: Diagnosis not present

## 2022-08-11 DIAGNOSIS — R296 Repeated falls: Secondary | ICD-10-CM | POA: Diagnosis not present

## 2022-08-12 DIAGNOSIS — R296 Repeated falls: Secondary | ICD-10-CM | POA: Diagnosis not present

## 2022-08-12 DIAGNOSIS — R278 Other lack of coordination: Secondary | ICD-10-CM | POA: Diagnosis not present

## 2022-08-16 DIAGNOSIS — M6281 Muscle weakness (generalized): Secondary | ICD-10-CM | POA: Diagnosis not present

## 2022-08-16 DIAGNOSIS — R296 Repeated falls: Secondary | ICD-10-CM | POA: Diagnosis not present

## 2022-08-18 DIAGNOSIS — M6281 Muscle weakness (generalized): Secondary | ICD-10-CM | POA: Diagnosis not present

## 2022-08-18 DIAGNOSIS — R296 Repeated falls: Secondary | ICD-10-CM | POA: Diagnosis not present

## 2022-08-22 DIAGNOSIS — R296 Repeated falls: Secondary | ICD-10-CM | POA: Diagnosis not present

## 2022-08-22 DIAGNOSIS — R278 Other lack of coordination: Secondary | ICD-10-CM | POA: Diagnosis not present

## 2022-08-23 DIAGNOSIS — M6281 Muscle weakness (generalized): Secondary | ICD-10-CM | POA: Diagnosis not present

## 2022-08-23 DIAGNOSIS — R296 Repeated falls: Secondary | ICD-10-CM | POA: Diagnosis not present

## 2022-08-25 DIAGNOSIS — R296 Repeated falls: Secondary | ICD-10-CM | POA: Diagnosis not present

## 2022-08-25 DIAGNOSIS — M6281 Muscle weakness (generalized): Secondary | ICD-10-CM | POA: Diagnosis not present

## 2022-08-26 DIAGNOSIS — F331 Major depressive disorder, recurrent, moderate: Secondary | ICD-10-CM | POA: Diagnosis not present

## 2022-08-26 DIAGNOSIS — R278 Other lack of coordination: Secondary | ICD-10-CM | POA: Diagnosis not present

## 2022-08-26 DIAGNOSIS — G20A1 Parkinson's disease without dyskinesia, without mention of fluctuations: Secondary | ICD-10-CM | POA: Diagnosis not present

## 2022-08-26 DIAGNOSIS — R296 Repeated falls: Secondary | ICD-10-CM | POA: Diagnosis not present

## 2022-08-29 DIAGNOSIS — R296 Repeated falls: Secondary | ICD-10-CM | POA: Diagnosis not present

## 2022-08-29 DIAGNOSIS — R278 Other lack of coordination: Secondary | ICD-10-CM | POA: Diagnosis not present

## 2022-08-30 DIAGNOSIS — M6281 Muscle weakness (generalized): Secondary | ICD-10-CM | POA: Diagnosis not present

## 2022-08-30 DIAGNOSIS — R296 Repeated falls: Secondary | ICD-10-CM | POA: Diagnosis not present

## 2022-09-01 DIAGNOSIS — M6281 Muscle weakness (generalized): Secondary | ICD-10-CM | POA: Diagnosis not present

## 2022-09-01 DIAGNOSIS — R296 Repeated falls: Secondary | ICD-10-CM | POA: Diagnosis not present

## 2022-09-02 DIAGNOSIS — R278 Other lack of coordination: Secondary | ICD-10-CM | POA: Diagnosis not present

## 2022-09-02 DIAGNOSIS — R296 Repeated falls: Secondary | ICD-10-CM | POA: Diagnosis not present

## 2022-09-05 DIAGNOSIS — R278 Other lack of coordination: Secondary | ICD-10-CM | POA: Diagnosis not present

## 2022-09-05 DIAGNOSIS — R296 Repeated falls: Secondary | ICD-10-CM | POA: Diagnosis not present

## 2022-09-06 DIAGNOSIS — M6281 Muscle weakness (generalized): Secondary | ICD-10-CM | POA: Diagnosis not present

## 2022-09-06 DIAGNOSIS — R296 Repeated falls: Secondary | ICD-10-CM | POA: Diagnosis not present

## 2022-09-08 DIAGNOSIS — M6281 Muscle weakness (generalized): Secondary | ICD-10-CM | POA: Diagnosis not present

## 2022-09-08 DIAGNOSIS — R296 Repeated falls: Secondary | ICD-10-CM | POA: Diagnosis not present

## 2022-09-13 DIAGNOSIS — R296 Repeated falls: Secondary | ICD-10-CM | POA: Diagnosis not present

## 2022-09-13 DIAGNOSIS — M6281 Muscle weakness (generalized): Secondary | ICD-10-CM | POA: Diagnosis not present

## 2022-09-14 DIAGNOSIS — R278 Other lack of coordination: Secondary | ICD-10-CM | POA: Diagnosis not present

## 2022-09-14 DIAGNOSIS — R296 Repeated falls: Secondary | ICD-10-CM | POA: Diagnosis not present

## 2022-09-15 DIAGNOSIS — R296 Repeated falls: Secondary | ICD-10-CM | POA: Diagnosis not present

## 2022-09-15 DIAGNOSIS — M6281 Muscle weakness (generalized): Secondary | ICD-10-CM | POA: Diagnosis not present

## 2022-09-20 DIAGNOSIS — R296 Repeated falls: Secondary | ICD-10-CM | POA: Diagnosis not present

## 2022-09-20 DIAGNOSIS — M6281 Muscle weakness (generalized): Secondary | ICD-10-CM | POA: Diagnosis not present

## 2022-09-21 DIAGNOSIS — R296 Repeated falls: Secondary | ICD-10-CM | POA: Diagnosis not present

## 2022-09-21 DIAGNOSIS — R278 Other lack of coordination: Secondary | ICD-10-CM | POA: Diagnosis not present

## 2022-09-22 DIAGNOSIS — R296 Repeated falls: Secondary | ICD-10-CM | POA: Diagnosis not present

## 2022-09-22 DIAGNOSIS — M6281 Muscle weakness (generalized): Secondary | ICD-10-CM | POA: Diagnosis not present

## 2022-09-22 DIAGNOSIS — F331 Major depressive disorder, recurrent, moderate: Secondary | ICD-10-CM | POA: Diagnosis not present

## 2022-09-22 DIAGNOSIS — G20A1 Parkinson's disease without dyskinesia, without mention of fluctuations: Secondary | ICD-10-CM | POA: Diagnosis not present

## 2022-09-27 DIAGNOSIS — M6281 Muscle weakness (generalized): Secondary | ICD-10-CM | POA: Diagnosis not present

## 2022-09-27 DIAGNOSIS — R296 Repeated falls: Secondary | ICD-10-CM | POA: Diagnosis not present

## 2022-09-28 DIAGNOSIS — R296 Repeated falls: Secondary | ICD-10-CM | POA: Diagnosis not present

## 2022-09-28 DIAGNOSIS — R278 Other lack of coordination: Secondary | ICD-10-CM | POA: Diagnosis not present

## 2022-09-29 DIAGNOSIS — R296 Repeated falls: Secondary | ICD-10-CM | POA: Diagnosis not present

## 2022-09-29 DIAGNOSIS — M6281 Muscle weakness (generalized): Secondary | ICD-10-CM | POA: Diagnosis not present

## 2022-10-03 DIAGNOSIS — R278 Other lack of coordination: Secondary | ICD-10-CM | POA: Diagnosis not present

## 2022-10-03 DIAGNOSIS — R296 Repeated falls: Secondary | ICD-10-CM | POA: Diagnosis not present

## 2022-10-04 DIAGNOSIS — M6281 Muscle weakness (generalized): Secondary | ICD-10-CM | POA: Diagnosis not present

## 2022-10-04 DIAGNOSIS — R296 Repeated falls: Secondary | ICD-10-CM | POA: Diagnosis not present

## 2022-10-14 DIAGNOSIS — R278 Other lack of coordination: Secondary | ICD-10-CM | POA: Diagnosis not present

## 2022-10-14 DIAGNOSIS — R296 Repeated falls: Secondary | ICD-10-CM | POA: Diagnosis not present

## 2022-10-17 DIAGNOSIS — R296 Repeated falls: Secondary | ICD-10-CM | POA: Diagnosis not present

## 2022-10-17 DIAGNOSIS — R278 Other lack of coordination: Secondary | ICD-10-CM | POA: Diagnosis not present

## 2022-10-20 DIAGNOSIS — G20A1 Parkinson's disease without dyskinesia, without mention of fluctuations: Secondary | ICD-10-CM | POA: Diagnosis not present

## 2022-10-20 DIAGNOSIS — F331 Major depressive disorder, recurrent, moderate: Secondary | ICD-10-CM | POA: Diagnosis not present

## 2022-10-24 DIAGNOSIS — R296 Repeated falls: Secondary | ICD-10-CM | POA: Diagnosis not present

## 2022-10-24 DIAGNOSIS — R278 Other lack of coordination: Secondary | ICD-10-CM | POA: Diagnosis not present

## 2022-10-28 DIAGNOSIS — R296 Repeated falls: Secondary | ICD-10-CM | POA: Diagnosis not present

## 2022-10-28 DIAGNOSIS — R278 Other lack of coordination: Secondary | ICD-10-CM | POA: Diagnosis not present

## 2022-11-04 DIAGNOSIS — R278 Other lack of coordination: Secondary | ICD-10-CM | POA: Diagnosis not present

## 2022-11-04 DIAGNOSIS — R296 Repeated falls: Secondary | ICD-10-CM | POA: Diagnosis not present

## 2022-11-07 DIAGNOSIS — R278 Other lack of coordination: Secondary | ICD-10-CM | POA: Diagnosis not present

## 2022-11-07 DIAGNOSIS — R296 Repeated falls: Secondary | ICD-10-CM | POA: Diagnosis not present

## 2022-11-09 DIAGNOSIS — R278 Other lack of coordination: Secondary | ICD-10-CM | POA: Diagnosis not present

## 2022-11-09 DIAGNOSIS — R296 Repeated falls: Secondary | ICD-10-CM | POA: Diagnosis not present

## 2022-11-14 DIAGNOSIS — R278 Other lack of coordination: Secondary | ICD-10-CM | POA: Diagnosis not present

## 2022-11-14 DIAGNOSIS — R296 Repeated falls: Secondary | ICD-10-CM | POA: Diagnosis not present

## 2022-11-16 DIAGNOSIS — R278 Other lack of coordination: Secondary | ICD-10-CM | POA: Diagnosis not present

## 2022-11-16 DIAGNOSIS — R296 Repeated falls: Secondary | ICD-10-CM | POA: Diagnosis not present

## 2022-11-17 DIAGNOSIS — G20A1 Parkinson's disease without dyskinesia, without mention of fluctuations: Secondary | ICD-10-CM | POA: Diagnosis not present

## 2022-11-17 DIAGNOSIS — F331 Major depressive disorder, recurrent, moderate: Secondary | ICD-10-CM | POA: Diagnosis not present

## 2022-11-21 DIAGNOSIS — R278 Other lack of coordination: Secondary | ICD-10-CM | POA: Diagnosis not present

## 2022-11-21 DIAGNOSIS — R296 Repeated falls: Secondary | ICD-10-CM | POA: Diagnosis not present

## 2022-11-22 DIAGNOSIS — F3130 Bipolar disorder, current episode depressed, mild or moderate severity, unspecified: Secondary | ICD-10-CM | POA: Diagnosis not present

## 2022-11-22 DIAGNOSIS — J439 Emphysema, unspecified: Secondary | ICD-10-CM | POA: Diagnosis not present

## 2022-11-22 DIAGNOSIS — G20B2 Parkinson's disease with dyskinesia, with fluctuations: Secondary | ICD-10-CM | POA: Diagnosis not present

## 2022-11-22 DIAGNOSIS — I480 Paroxysmal atrial fibrillation: Secondary | ICD-10-CM | POA: Diagnosis not present

## 2022-11-22 DIAGNOSIS — I700 Atherosclerosis of aorta: Secondary | ICD-10-CM | POA: Diagnosis not present

## 2022-11-22 DIAGNOSIS — N1831 Chronic kidney disease, stage 3a: Secondary | ICD-10-CM | POA: Diagnosis not present

## 2022-11-22 DIAGNOSIS — E782 Mixed hyperlipidemia: Secondary | ICD-10-CM | POA: Diagnosis not present

## 2022-11-22 DIAGNOSIS — I739 Peripheral vascular disease, unspecified: Secondary | ICD-10-CM | POA: Diagnosis not present

## 2022-11-22 DIAGNOSIS — I951 Orthostatic hypotension: Secondary | ICD-10-CM | POA: Diagnosis not present

## 2022-11-28 DIAGNOSIS — R278 Other lack of coordination: Secondary | ICD-10-CM | POA: Diagnosis not present

## 2022-11-28 DIAGNOSIS — R296 Repeated falls: Secondary | ICD-10-CM | POA: Diagnosis not present

## 2022-12-02 DIAGNOSIS — R296 Repeated falls: Secondary | ICD-10-CM | POA: Diagnosis not present

## 2022-12-02 DIAGNOSIS — R278 Other lack of coordination: Secondary | ICD-10-CM | POA: Diagnosis not present

## 2022-12-05 DIAGNOSIS — R296 Repeated falls: Secondary | ICD-10-CM | POA: Diagnosis not present

## 2022-12-05 DIAGNOSIS — R278 Other lack of coordination: Secondary | ICD-10-CM | POA: Diagnosis not present

## 2022-12-07 DIAGNOSIS — R278 Other lack of coordination: Secondary | ICD-10-CM | POA: Diagnosis not present

## 2022-12-07 DIAGNOSIS — R296 Repeated falls: Secondary | ICD-10-CM | POA: Diagnosis not present

## 2022-12-09 DIAGNOSIS — F331 Major depressive disorder, recurrent, moderate: Secondary | ICD-10-CM | POA: Diagnosis not present

## 2022-12-09 DIAGNOSIS — G20A1 Parkinson's disease without dyskinesia, without mention of fluctuations: Secondary | ICD-10-CM | POA: Diagnosis not present

## 2022-12-19 DIAGNOSIS — R296 Repeated falls: Secondary | ICD-10-CM | POA: Diagnosis not present

## 2022-12-19 DIAGNOSIS — R278 Other lack of coordination: Secondary | ICD-10-CM | POA: Diagnosis not present

## 2022-12-21 DIAGNOSIS — R296 Repeated falls: Secondary | ICD-10-CM | POA: Diagnosis not present

## 2022-12-21 DIAGNOSIS — R278 Other lack of coordination: Secondary | ICD-10-CM | POA: Diagnosis not present

## 2022-12-22 DIAGNOSIS — M6281 Muscle weakness (generalized): Secondary | ICD-10-CM | POA: Diagnosis not present

## 2022-12-27 DIAGNOSIS — M6281 Muscle weakness (generalized): Secondary | ICD-10-CM | POA: Diagnosis not present

## 2022-12-29 DIAGNOSIS — M6281 Muscle weakness (generalized): Secondary | ICD-10-CM | POA: Diagnosis not present

## 2023-01-02 DIAGNOSIS — R278 Other lack of coordination: Secondary | ICD-10-CM | POA: Diagnosis not present

## 2023-01-02 DIAGNOSIS — R296 Repeated falls: Secondary | ICD-10-CM | POA: Diagnosis not present

## 2023-01-03 DIAGNOSIS — M6281 Muscle weakness (generalized): Secondary | ICD-10-CM | POA: Diagnosis not present

## 2023-01-05 DIAGNOSIS — G20A1 Parkinson's disease without dyskinesia, without mention of fluctuations: Secondary | ICD-10-CM | POA: Diagnosis not present

## 2023-01-05 DIAGNOSIS — M6281 Muscle weakness (generalized): Secondary | ICD-10-CM | POA: Diagnosis not present

## 2023-01-05 DIAGNOSIS — F331 Major depressive disorder, recurrent, moderate: Secondary | ICD-10-CM | POA: Diagnosis not present

## 2023-01-13 DIAGNOSIS — R296 Repeated falls: Secondary | ICD-10-CM | POA: Diagnosis not present

## 2023-01-13 DIAGNOSIS — R278 Other lack of coordination: Secondary | ICD-10-CM | POA: Diagnosis not present

## 2023-01-18 DIAGNOSIS — R278 Other lack of coordination: Secondary | ICD-10-CM | POA: Diagnosis not present

## 2023-01-18 DIAGNOSIS — R296 Repeated falls: Secondary | ICD-10-CM | POA: Diagnosis not present

## 2023-01-23 DIAGNOSIS — R278 Other lack of coordination: Secondary | ICD-10-CM | POA: Diagnosis not present

## 2023-01-23 DIAGNOSIS — R296 Repeated falls: Secondary | ICD-10-CM | POA: Diagnosis not present

## 2023-01-24 DIAGNOSIS — M6281 Muscle weakness (generalized): Secondary | ICD-10-CM | POA: Diagnosis not present

## 2023-01-25 DIAGNOSIS — R296 Repeated falls: Secondary | ICD-10-CM | POA: Diagnosis not present

## 2023-01-25 DIAGNOSIS — R278 Other lack of coordination: Secondary | ICD-10-CM | POA: Diagnosis not present

## 2023-01-26 DIAGNOSIS — M6281 Muscle weakness (generalized): Secondary | ICD-10-CM | POA: Diagnosis not present

## 2023-01-30 DIAGNOSIS — R278 Other lack of coordination: Secondary | ICD-10-CM | POA: Diagnosis not present

## 2023-01-30 DIAGNOSIS — R296 Repeated falls: Secondary | ICD-10-CM | POA: Diagnosis not present

## 2023-01-31 DIAGNOSIS — M6281 Muscle weakness (generalized): Secondary | ICD-10-CM | POA: Diagnosis not present

## 2023-01-31 DIAGNOSIS — G20A1 Parkinson's disease without dyskinesia, without mention of fluctuations: Secondary | ICD-10-CM | POA: Diagnosis not present

## 2023-01-31 DIAGNOSIS — F331 Major depressive disorder, recurrent, moderate: Secondary | ICD-10-CM | POA: Diagnosis not present

## 2023-02-01 DIAGNOSIS — M6281 Muscle weakness (generalized): Secondary | ICD-10-CM | POA: Diagnosis not present

## 2023-02-03 DIAGNOSIS — R278 Other lack of coordination: Secondary | ICD-10-CM | POA: Diagnosis not present

## 2023-02-03 DIAGNOSIS — R296 Repeated falls: Secondary | ICD-10-CM | POA: Diagnosis not present

## 2023-02-07 DIAGNOSIS — M6281 Muscle weakness (generalized): Secondary | ICD-10-CM | POA: Diagnosis not present

## 2023-02-08 DIAGNOSIS — R296 Repeated falls: Secondary | ICD-10-CM | POA: Diagnosis not present

## 2023-02-08 DIAGNOSIS — R278 Other lack of coordination: Secondary | ICD-10-CM | POA: Diagnosis not present

## 2023-02-09 DIAGNOSIS — M6281 Muscle weakness (generalized): Secondary | ICD-10-CM | POA: Diagnosis not present

## 2023-02-10 DIAGNOSIS — R296 Repeated falls: Secondary | ICD-10-CM | POA: Diagnosis not present

## 2023-02-10 DIAGNOSIS — R278 Other lack of coordination: Secondary | ICD-10-CM | POA: Diagnosis not present

## 2023-02-14 DIAGNOSIS — M6281 Muscle weakness (generalized): Secondary | ICD-10-CM | POA: Diagnosis not present

## 2023-02-16 DIAGNOSIS — M6281 Muscle weakness (generalized): Secondary | ICD-10-CM | POA: Diagnosis not present

## 2023-02-17 DIAGNOSIS — R296 Repeated falls: Secondary | ICD-10-CM | POA: Diagnosis not present

## 2023-02-17 DIAGNOSIS — R278 Other lack of coordination: Secondary | ICD-10-CM | POA: Diagnosis not present

## 2023-02-20 DIAGNOSIS — H353131 Nonexudative age-related macular degeneration, bilateral, early dry stage: Secondary | ICD-10-CM | POA: Diagnosis not present

## 2023-02-20 DIAGNOSIS — R278 Other lack of coordination: Secondary | ICD-10-CM | POA: Diagnosis not present

## 2023-02-20 DIAGNOSIS — R296 Repeated falls: Secondary | ICD-10-CM | POA: Diagnosis not present

## 2023-02-21 DIAGNOSIS — M6281 Muscle weakness (generalized): Secondary | ICD-10-CM | POA: Diagnosis not present

## 2023-02-22 DIAGNOSIS — R278 Other lack of coordination: Secondary | ICD-10-CM | POA: Diagnosis not present

## 2023-02-22 DIAGNOSIS — R296 Repeated falls: Secondary | ICD-10-CM | POA: Diagnosis not present

## 2023-02-23 DIAGNOSIS — M6281 Muscle weakness (generalized): Secondary | ICD-10-CM | POA: Diagnosis not present

## 2023-02-28 DIAGNOSIS — M6281 Muscle weakness (generalized): Secondary | ICD-10-CM | POA: Diagnosis not present

## 2023-03-01 DIAGNOSIS — R278 Other lack of coordination: Secondary | ICD-10-CM | POA: Diagnosis not present

## 2023-03-01 DIAGNOSIS — R296 Repeated falls: Secondary | ICD-10-CM | POA: Diagnosis not present

## 2023-03-02 DIAGNOSIS — R82998 Other abnormal findings in urine: Secondary | ICD-10-CM | POA: Diagnosis not present

## 2023-03-02 DIAGNOSIS — G20A1 Parkinson's disease without dyskinesia, without mention of fluctuations: Secondary | ICD-10-CM | POA: Diagnosis not present

## 2023-03-02 DIAGNOSIS — F331 Major depressive disorder, recurrent, moderate: Secondary | ICD-10-CM | POA: Diagnosis not present

## 2023-03-03 DIAGNOSIS — R278 Other lack of coordination: Secondary | ICD-10-CM | POA: Diagnosis not present

## 2023-03-03 DIAGNOSIS — R296 Repeated falls: Secondary | ICD-10-CM | POA: Diagnosis not present

## 2023-03-07 DIAGNOSIS — M6281 Muscle weakness (generalized): Secondary | ICD-10-CM | POA: Diagnosis not present

## 2023-03-08 DIAGNOSIS — R278 Other lack of coordination: Secondary | ICD-10-CM | POA: Diagnosis not present

## 2023-03-08 DIAGNOSIS — R296 Repeated falls: Secondary | ICD-10-CM | POA: Diagnosis not present

## 2023-03-09 DIAGNOSIS — M6281 Muscle weakness (generalized): Secondary | ICD-10-CM | POA: Diagnosis not present

## 2023-03-14 DIAGNOSIS — M6281 Muscle weakness (generalized): Secondary | ICD-10-CM | POA: Diagnosis not present

## 2023-03-15 DIAGNOSIS — R278 Other lack of coordination: Secondary | ICD-10-CM | POA: Diagnosis not present

## 2023-03-15 DIAGNOSIS — R296 Repeated falls: Secondary | ICD-10-CM | POA: Diagnosis not present

## 2023-03-16 DIAGNOSIS — M6281 Muscle weakness (generalized): Secondary | ICD-10-CM | POA: Diagnosis not present

## 2023-03-17 DIAGNOSIS — R278 Other lack of coordination: Secondary | ICD-10-CM | POA: Diagnosis not present

## 2023-03-17 DIAGNOSIS — R296 Repeated falls: Secondary | ICD-10-CM | POA: Diagnosis not present

## 2023-03-20 DIAGNOSIS — R296 Repeated falls: Secondary | ICD-10-CM | POA: Diagnosis not present

## 2023-03-20 DIAGNOSIS — R278 Other lack of coordination: Secondary | ICD-10-CM | POA: Diagnosis not present

## 2023-03-21 DIAGNOSIS — M6281 Muscle weakness (generalized): Secondary | ICD-10-CM | POA: Diagnosis not present

## 2023-03-23 DIAGNOSIS — M6281 Muscle weakness (generalized): Secondary | ICD-10-CM | POA: Diagnosis not present

## 2023-03-24 DIAGNOSIS — R278 Other lack of coordination: Secondary | ICD-10-CM | POA: Diagnosis not present

## 2023-03-24 DIAGNOSIS — R296 Repeated falls: Secondary | ICD-10-CM | POA: Diagnosis not present

## 2023-03-27 DIAGNOSIS — R278 Other lack of coordination: Secondary | ICD-10-CM | POA: Diagnosis not present

## 2023-03-27 DIAGNOSIS — R296 Repeated falls: Secondary | ICD-10-CM | POA: Diagnosis not present

## 2023-03-28 DIAGNOSIS — F319 Bipolar disorder, unspecified: Secondary | ICD-10-CM | POA: Diagnosis not present

## 2023-03-28 DIAGNOSIS — N1831 Chronic kidney disease, stage 3a: Secondary | ICD-10-CM | POA: Diagnosis not present

## 2023-03-28 DIAGNOSIS — Z9181 History of falling: Secondary | ICD-10-CM | POA: Diagnosis not present

## 2023-03-28 DIAGNOSIS — Z1389 Encounter for screening for other disorder: Secondary | ICD-10-CM | POA: Diagnosis not present

## 2023-03-28 DIAGNOSIS — I700 Atherosclerosis of aorta: Secondary | ICD-10-CM | POA: Diagnosis not present

## 2023-03-28 DIAGNOSIS — Z23 Encounter for immunization: Secondary | ICD-10-CM | POA: Diagnosis not present

## 2023-03-28 DIAGNOSIS — M6281 Muscle weakness (generalized): Secondary | ICD-10-CM | POA: Diagnosis not present

## 2023-03-28 DIAGNOSIS — I480 Paroxysmal atrial fibrillation: Secondary | ICD-10-CM | POA: Diagnosis not present

## 2023-03-28 DIAGNOSIS — G909 Disorder of the autonomic nervous system, unspecified: Secondary | ICD-10-CM | POA: Diagnosis not present

## 2023-03-28 DIAGNOSIS — Z0000 Encounter for general adult medical examination without abnormal findings: Secondary | ICD-10-CM | POA: Diagnosis not present

## 2023-03-28 DIAGNOSIS — R7303 Prediabetes: Secondary | ICD-10-CM | POA: Diagnosis not present

## 2023-03-28 DIAGNOSIS — J439 Emphysema, unspecified: Secondary | ICD-10-CM | POA: Diagnosis not present

## 2023-03-28 DIAGNOSIS — E782 Mixed hyperlipidemia: Secondary | ICD-10-CM | POA: Diagnosis not present

## 2023-03-28 DIAGNOSIS — I739 Peripheral vascular disease, unspecified: Secondary | ICD-10-CM | POA: Diagnosis not present

## 2023-03-30 DIAGNOSIS — F331 Major depressive disorder, recurrent, moderate: Secondary | ICD-10-CM | POA: Diagnosis not present

## 2023-03-30 DIAGNOSIS — M6281 Muscle weakness (generalized): Secondary | ICD-10-CM | POA: Diagnosis not present

## 2023-03-30 DIAGNOSIS — G20A1 Parkinson's disease without dyskinesia, without mention of fluctuations: Secondary | ICD-10-CM | POA: Diagnosis not present

## 2023-03-31 DIAGNOSIS — R296 Repeated falls: Secondary | ICD-10-CM | POA: Diagnosis not present

## 2023-03-31 DIAGNOSIS — R278 Other lack of coordination: Secondary | ICD-10-CM | POA: Diagnosis not present

## 2023-04-04 DIAGNOSIS — M6281 Muscle weakness (generalized): Secondary | ICD-10-CM | POA: Diagnosis not present

## 2023-04-05 DIAGNOSIS — R278 Other lack of coordination: Secondary | ICD-10-CM | POA: Diagnosis not present

## 2023-04-05 DIAGNOSIS — R296 Repeated falls: Secondary | ICD-10-CM | POA: Diagnosis not present

## 2023-04-06 DIAGNOSIS — M6281 Muscle weakness (generalized): Secondary | ICD-10-CM | POA: Diagnosis not present

## 2023-04-07 DIAGNOSIS — R296 Repeated falls: Secondary | ICD-10-CM | POA: Diagnosis not present

## 2023-04-07 DIAGNOSIS — R278 Other lack of coordination: Secondary | ICD-10-CM | POA: Diagnosis not present

## 2023-04-10 DIAGNOSIS — R296 Repeated falls: Secondary | ICD-10-CM | POA: Diagnosis not present

## 2023-04-10 DIAGNOSIS — R278 Other lack of coordination: Secondary | ICD-10-CM | POA: Diagnosis not present

## 2023-04-12 DIAGNOSIS — R278 Other lack of coordination: Secondary | ICD-10-CM | POA: Diagnosis not present

## 2023-04-12 DIAGNOSIS — R296 Repeated falls: Secondary | ICD-10-CM | POA: Diagnosis not present

## 2023-04-17 DIAGNOSIS — R296 Repeated falls: Secondary | ICD-10-CM | POA: Diagnosis not present

## 2023-04-17 DIAGNOSIS — R278 Other lack of coordination: Secondary | ICD-10-CM | POA: Diagnosis not present

## 2023-04-19 DIAGNOSIS — R278 Other lack of coordination: Secondary | ICD-10-CM | POA: Diagnosis not present

## 2023-04-19 DIAGNOSIS — R296 Repeated falls: Secondary | ICD-10-CM | POA: Diagnosis not present

## 2023-04-26 DIAGNOSIS — R296 Repeated falls: Secondary | ICD-10-CM | POA: Diagnosis not present

## 2023-04-26 DIAGNOSIS — R278 Other lack of coordination: Secondary | ICD-10-CM | POA: Diagnosis not present

## 2023-04-27 DIAGNOSIS — G20A1 Parkinson's disease without dyskinesia, without mention of fluctuations: Secondary | ICD-10-CM | POA: Diagnosis not present

## 2023-04-27 DIAGNOSIS — F331 Major depressive disorder, recurrent, moderate: Secondary | ICD-10-CM | POA: Diagnosis not present

## 2023-04-28 DIAGNOSIS — R296 Repeated falls: Secondary | ICD-10-CM | POA: Diagnosis not present

## 2023-04-28 DIAGNOSIS — R278 Other lack of coordination: Secondary | ICD-10-CM | POA: Diagnosis not present

## 2023-05-03 DIAGNOSIS — R296 Repeated falls: Secondary | ICD-10-CM | POA: Diagnosis not present

## 2023-05-03 DIAGNOSIS — R278 Other lack of coordination: Secondary | ICD-10-CM | POA: Diagnosis not present

## 2023-05-05 DIAGNOSIS — R296 Repeated falls: Secondary | ICD-10-CM | POA: Diagnosis not present

## 2023-05-05 DIAGNOSIS — R278 Other lack of coordination: Secondary | ICD-10-CM | POA: Diagnosis not present

## 2023-05-08 DIAGNOSIS — R296 Repeated falls: Secondary | ICD-10-CM | POA: Diagnosis not present

## 2023-05-08 DIAGNOSIS — R278 Other lack of coordination: Secondary | ICD-10-CM | POA: Diagnosis not present

## 2023-05-10 DIAGNOSIS — R278 Other lack of coordination: Secondary | ICD-10-CM | POA: Diagnosis not present

## 2023-05-10 DIAGNOSIS — R296 Repeated falls: Secondary | ICD-10-CM | POA: Diagnosis not present

## 2023-05-15 DIAGNOSIS — R296 Repeated falls: Secondary | ICD-10-CM | POA: Diagnosis not present

## 2023-05-15 DIAGNOSIS — R278 Other lack of coordination: Secondary | ICD-10-CM | POA: Diagnosis not present

## 2023-05-18 DIAGNOSIS — R278 Other lack of coordination: Secondary | ICD-10-CM | POA: Diagnosis not present

## 2023-05-18 DIAGNOSIS — R296 Repeated falls: Secondary | ICD-10-CM | POA: Diagnosis not present

## 2023-05-22 DIAGNOSIS — R296 Repeated falls: Secondary | ICD-10-CM | POA: Diagnosis not present

## 2023-05-22 DIAGNOSIS — R278 Other lack of coordination: Secondary | ICD-10-CM | POA: Diagnosis not present

## 2023-05-23 DIAGNOSIS — Z23 Encounter for immunization: Secondary | ICD-10-CM | POA: Diagnosis not present

## 2023-05-31 DIAGNOSIS — R296 Repeated falls: Secondary | ICD-10-CM | POA: Diagnosis not present

## 2023-05-31 DIAGNOSIS — R278 Other lack of coordination: Secondary | ICD-10-CM | POA: Diagnosis not present

## 2023-06-01 DIAGNOSIS — F331 Major depressive disorder, recurrent, moderate: Secondary | ICD-10-CM | POA: Diagnosis not present

## 2023-06-01 DIAGNOSIS — G20A1 Parkinson's disease without dyskinesia, without mention of fluctuations: Secondary | ICD-10-CM | POA: Diagnosis not present

## 2023-06-05 DIAGNOSIS — R278 Other lack of coordination: Secondary | ICD-10-CM | POA: Diagnosis not present

## 2023-06-05 DIAGNOSIS — R296 Repeated falls: Secondary | ICD-10-CM | POA: Diagnosis not present

## 2023-06-07 DIAGNOSIS — R278 Other lack of coordination: Secondary | ICD-10-CM | POA: Diagnosis not present

## 2023-06-07 DIAGNOSIS — R296 Repeated falls: Secondary | ICD-10-CM | POA: Diagnosis not present

## 2023-06-14 DIAGNOSIS — R278 Other lack of coordination: Secondary | ICD-10-CM | POA: Diagnosis not present

## 2023-06-14 DIAGNOSIS — R296 Repeated falls: Secondary | ICD-10-CM | POA: Diagnosis not present

## 2023-06-21 DIAGNOSIS — R296 Repeated falls: Secondary | ICD-10-CM | POA: Diagnosis not present

## 2023-06-21 DIAGNOSIS — R278 Other lack of coordination: Secondary | ICD-10-CM | POA: Diagnosis not present

## 2023-06-22 DIAGNOSIS — F331 Major depressive disorder, recurrent, moderate: Secondary | ICD-10-CM | POA: Diagnosis not present

## 2023-06-22 DIAGNOSIS — G20A1 Parkinson's disease without dyskinesia, without mention of fluctuations: Secondary | ICD-10-CM | POA: Diagnosis not present

## 2023-07-05 DIAGNOSIS — R296 Repeated falls: Secondary | ICD-10-CM | POA: Diagnosis not present

## 2023-07-05 DIAGNOSIS — R278 Other lack of coordination: Secondary | ICD-10-CM | POA: Diagnosis not present

## 2023-07-07 DIAGNOSIS — R296 Repeated falls: Secondary | ICD-10-CM | POA: Diagnosis not present

## 2023-07-07 DIAGNOSIS — R278 Other lack of coordination: Secondary | ICD-10-CM | POA: Diagnosis not present

## 2023-07-10 DIAGNOSIS — R296 Repeated falls: Secondary | ICD-10-CM | POA: Diagnosis not present

## 2023-07-10 DIAGNOSIS — R278 Other lack of coordination: Secondary | ICD-10-CM | POA: Diagnosis not present

## 2023-07-18 DIAGNOSIS — G20A1 Parkinson's disease without dyskinesia, without mention of fluctuations: Secondary | ICD-10-CM | POA: Diagnosis not present

## 2023-07-18 DIAGNOSIS — F331 Major depressive disorder, recurrent, moderate: Secondary | ICD-10-CM | POA: Diagnosis not present

## 2023-07-20 ENCOUNTER — Ambulatory Visit: Payer: Medicare PPO | Admitting: Family Medicine

## 2023-07-20 ENCOUNTER — Encounter: Payer: Self-pay | Admitting: Family Medicine

## 2023-07-20 VITALS — BP 132/73 | HR 73 | Wt 207.0 lb

## 2023-07-20 DIAGNOSIS — R2689 Other abnormalities of gait and mobility: Secondary | ICD-10-CM

## 2023-07-20 DIAGNOSIS — G252 Other specified forms of tremor: Secondary | ICD-10-CM

## 2023-07-20 DIAGNOSIS — G20A1 Parkinson's disease without dyskinesia, without mention of fluctuations: Secondary | ICD-10-CM | POA: Diagnosis not present

## 2023-07-20 MED ORDER — CARBIDOPA-LEVODOPA 25-100 MG PO TABS: 1.0000 | ORAL_TABLET | Freq: Three times a day (TID) | ORAL | 3 refills | Status: AC

## 2023-07-20 NOTE — Patient Instructions (Signed)
Below is our plan:  We will continue carbidopa levodopa 25-100mg  1 tablet three times daily.   I do recommend regular physical activity. Can consider PT therapy is needed.   Please make sure you are staying well hydrated. I recommend 50-60 ounces daily. Well balanced diet and regular exercise encouraged. Consistent sleep schedule with 6-8 hours recommended.   Please continue follow up with care team as directed.   Follow up with Dr Marjory Lies in 1 year   You may receive a survey regarding today's visit. I encourage you to leave honest feed back as I do use this information to improve patient care. Thank you for seeing me today!
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
## 2023-11-30 DIAGNOSIS — M6281 Muscle weakness (generalized): Secondary | ICD-10-CM | POA: Diagnosis not present

## 2023-12-04 DIAGNOSIS — M6281 Muscle weakness (generalized): Secondary | ICD-10-CM | POA: Diagnosis not present

## 2023-12-06 DIAGNOSIS — M6281 Muscle weakness (generalized): Secondary | ICD-10-CM | POA: Diagnosis not present

## 2023-12-07 DIAGNOSIS — G20A1 Parkinson's disease without dyskinesia, without mention of fluctuations: Secondary | ICD-10-CM | POA: Diagnosis not present

## 2023-12-07 DIAGNOSIS — F331 Major depressive disorder, recurrent, moderate: Secondary | ICD-10-CM | POA: Diagnosis not present

## 2023-12-11 DIAGNOSIS — M6281 Muscle weakness (generalized): Secondary | ICD-10-CM | POA: Diagnosis not present

## 2023-12-20 DIAGNOSIS — M6281 Muscle weakness (generalized): Secondary | ICD-10-CM | POA: Diagnosis not present

## 2023-12-21 DIAGNOSIS — M6281 Muscle weakness (generalized): Secondary | ICD-10-CM | POA: Diagnosis not present

## 2023-12-21 DIAGNOSIS — R278 Other lack of coordination: Secondary | ICD-10-CM | POA: Diagnosis not present

## 2023-12-25 DIAGNOSIS — M6281 Muscle weakness (generalized): Secondary | ICD-10-CM | POA: Diagnosis not present

## 2023-12-27 DIAGNOSIS — R278 Other lack of coordination: Secondary | ICD-10-CM | POA: Diagnosis not present

## 2023-12-27 DIAGNOSIS — M6281 Muscle weakness (generalized): Secondary | ICD-10-CM | POA: Diagnosis not present

## 2024-01-01 DIAGNOSIS — M6281 Muscle weakness (generalized): Secondary | ICD-10-CM | POA: Diagnosis not present

## 2024-01-01 DIAGNOSIS — R278 Other lack of coordination: Secondary | ICD-10-CM | POA: Diagnosis not present

## 2024-01-04 DIAGNOSIS — M6281 Muscle weakness (generalized): Secondary | ICD-10-CM | POA: Diagnosis not present

## 2024-01-04 DIAGNOSIS — R278 Other lack of coordination: Secondary | ICD-10-CM | POA: Diagnosis not present

## 2024-01-04 DIAGNOSIS — F331 Major depressive disorder, recurrent, moderate: Secondary | ICD-10-CM | POA: Diagnosis not present

## 2024-01-04 DIAGNOSIS — G20A1 Parkinson's disease without dyskinesia, without mention of fluctuations: Secondary | ICD-10-CM | POA: Diagnosis not present

## 2024-01-07 DIAGNOSIS — M6281 Muscle weakness (generalized): Secondary | ICD-10-CM | POA: Diagnosis not present

## 2024-01-10 DIAGNOSIS — R278 Other lack of coordination: Secondary | ICD-10-CM | POA: Diagnosis not present

## 2024-01-10 DIAGNOSIS — M6281 Muscle weakness (generalized): Secondary | ICD-10-CM | POA: Diagnosis not present

## 2024-01-11 DIAGNOSIS — M6281 Muscle weakness (generalized): Secondary | ICD-10-CM | POA: Diagnosis not present

## 2024-01-12 DIAGNOSIS — M6281 Muscle weakness (generalized): Secondary | ICD-10-CM | POA: Diagnosis not present

## 2024-01-12 DIAGNOSIS — R278 Other lack of coordination: Secondary | ICD-10-CM | POA: Diagnosis not present

## 2024-01-15 DIAGNOSIS — R278 Other lack of coordination: Secondary | ICD-10-CM | POA: Diagnosis not present

## 2024-01-15 DIAGNOSIS — M6281 Muscle weakness (generalized): Secondary | ICD-10-CM | POA: Diagnosis not present

## 2024-01-16 DIAGNOSIS — M6281 Muscle weakness (generalized): Secondary | ICD-10-CM | POA: Diagnosis not present

## 2024-01-17 DIAGNOSIS — R278 Other lack of coordination: Secondary | ICD-10-CM | POA: Diagnosis not present

## 2024-01-17 DIAGNOSIS — M6281 Muscle weakness (generalized): Secondary | ICD-10-CM | POA: Diagnosis not present

## 2024-01-18 DIAGNOSIS — M6281 Muscle weakness (generalized): Secondary | ICD-10-CM | POA: Diagnosis not present

## 2024-01-19 DIAGNOSIS — R278 Other lack of coordination: Secondary | ICD-10-CM | POA: Diagnosis not present

## 2024-01-19 DIAGNOSIS — M6281 Muscle weakness (generalized): Secondary | ICD-10-CM | POA: Diagnosis not present

## 2024-01-22 DIAGNOSIS — M6281 Muscle weakness (generalized): Secondary | ICD-10-CM | POA: Diagnosis not present

## 2024-01-22 DIAGNOSIS — R278 Other lack of coordination: Secondary | ICD-10-CM | POA: Diagnosis not present

## 2024-01-23 DIAGNOSIS — M6281 Muscle weakness (generalized): Secondary | ICD-10-CM | POA: Diagnosis not present

## 2024-01-29 DIAGNOSIS — M6281 Muscle weakness (generalized): Secondary | ICD-10-CM | POA: Diagnosis not present

## 2024-01-31 DIAGNOSIS — M6281 Muscle weakness (generalized): Secondary | ICD-10-CM | POA: Diagnosis not present

## 2024-01-31 DIAGNOSIS — R278 Other lack of coordination: Secondary | ICD-10-CM | POA: Diagnosis not present

## 2024-02-01 DIAGNOSIS — G20A1 Parkinson's disease without dyskinesia, without mention of fluctuations: Secondary | ICD-10-CM | POA: Diagnosis not present

## 2024-02-01 DIAGNOSIS — M6281 Muscle weakness (generalized): Secondary | ICD-10-CM | POA: Diagnosis not present

## 2024-02-01 DIAGNOSIS — F17210 Nicotine dependence, cigarettes, uncomplicated: Secondary | ICD-10-CM | POA: Diagnosis not present

## 2024-02-01 DIAGNOSIS — F331 Major depressive disorder, recurrent, moderate: Secondary | ICD-10-CM | POA: Diagnosis not present

## 2024-02-05 DIAGNOSIS — M6281 Muscle weakness (generalized): Secondary | ICD-10-CM | POA: Diagnosis not present

## 2024-02-05 DIAGNOSIS — R278 Other lack of coordination: Secondary | ICD-10-CM | POA: Diagnosis not present

## 2024-02-06 DIAGNOSIS — M6281 Muscle weakness (generalized): Secondary | ICD-10-CM | POA: Diagnosis not present

## 2024-02-07 DIAGNOSIS — M6281 Muscle weakness (generalized): Secondary | ICD-10-CM | POA: Diagnosis not present

## 2024-02-07 DIAGNOSIS — R278 Other lack of coordination: Secondary | ICD-10-CM | POA: Diagnosis not present

## 2024-02-08 DIAGNOSIS — M6281 Muscle weakness (generalized): Secondary | ICD-10-CM | POA: Diagnosis not present

## 2024-02-12 DIAGNOSIS — M6281 Muscle weakness (generalized): Secondary | ICD-10-CM | POA: Diagnosis not present

## 2024-02-12 DIAGNOSIS — R278 Other lack of coordination: Secondary | ICD-10-CM | POA: Diagnosis not present

## 2024-02-19 DIAGNOSIS — R278 Other lack of coordination: Secondary | ICD-10-CM | POA: Diagnosis not present

## 2024-02-19 DIAGNOSIS — M6281 Muscle weakness (generalized): Secondary | ICD-10-CM | POA: Diagnosis not present

## 2024-02-21 DIAGNOSIS — R278 Other lack of coordination: Secondary | ICD-10-CM | POA: Diagnosis not present

## 2024-02-21 DIAGNOSIS — M6281 Muscle weakness (generalized): Secondary | ICD-10-CM | POA: Diagnosis not present

## 2024-02-26 DIAGNOSIS — R278 Other lack of coordination: Secondary | ICD-10-CM | POA: Diagnosis not present

## 2024-02-26 DIAGNOSIS — M6281 Muscle weakness (generalized): Secondary | ICD-10-CM | POA: Diagnosis not present

## 2024-02-29 DIAGNOSIS — G20A1 Parkinson's disease without dyskinesia, without mention of fluctuations: Secondary | ICD-10-CM | POA: Diagnosis not present

## 2024-02-29 DIAGNOSIS — F17210 Nicotine dependence, cigarettes, uncomplicated: Secondary | ICD-10-CM | POA: Diagnosis not present

## 2024-02-29 DIAGNOSIS — F331 Major depressive disorder, recurrent, moderate: Secondary | ICD-10-CM | POA: Diagnosis not present

## 2024-03-01 DIAGNOSIS — R278 Other lack of coordination: Secondary | ICD-10-CM | POA: Diagnosis not present

## 2024-03-01 DIAGNOSIS — M6281 Muscle weakness (generalized): Secondary | ICD-10-CM | POA: Diagnosis not present

## 2024-03-04 DIAGNOSIS — R278 Other lack of coordination: Secondary | ICD-10-CM | POA: Diagnosis not present

## 2024-03-04 DIAGNOSIS — M6281 Muscle weakness (generalized): Secondary | ICD-10-CM | POA: Diagnosis not present

## 2024-03-08 DIAGNOSIS — R278 Other lack of coordination: Secondary | ICD-10-CM | POA: Diagnosis not present

## 2024-03-08 DIAGNOSIS — M6281 Muscle weakness (generalized): Secondary | ICD-10-CM | POA: Diagnosis not present

## 2024-03-11 DIAGNOSIS — R278 Other lack of coordination: Secondary | ICD-10-CM | POA: Diagnosis not present

## 2024-03-11 DIAGNOSIS — M6281 Muscle weakness (generalized): Secondary | ICD-10-CM | POA: Diagnosis not present

## 2024-03-13 DIAGNOSIS — R278 Other lack of coordination: Secondary | ICD-10-CM | POA: Diagnosis not present

## 2024-03-13 DIAGNOSIS — M6281 Muscle weakness (generalized): Secondary | ICD-10-CM | POA: Diagnosis not present

## 2024-03-18 DIAGNOSIS — R278 Other lack of coordination: Secondary | ICD-10-CM | POA: Diagnosis not present

## 2024-03-18 DIAGNOSIS — M6281 Muscle weakness (generalized): Secondary | ICD-10-CM | POA: Diagnosis not present

## 2024-03-20 DIAGNOSIS — R278 Other lack of coordination: Secondary | ICD-10-CM | POA: Diagnosis not present

## 2024-03-20 DIAGNOSIS — M6281 Muscle weakness (generalized): Secondary | ICD-10-CM | POA: Diagnosis not present

## 2024-03-24 DIAGNOSIS — F17210 Nicotine dependence, cigarettes, uncomplicated: Secondary | ICD-10-CM | POA: Diagnosis not present

## 2024-03-24 DIAGNOSIS — G20A1 Parkinson's disease without dyskinesia, without mention of fluctuations: Secondary | ICD-10-CM | POA: Diagnosis not present

## 2024-03-24 DIAGNOSIS — F331 Major depressive disorder, recurrent, moderate: Secondary | ICD-10-CM | POA: Diagnosis not present

## 2024-03-25 DIAGNOSIS — M6281 Muscle weakness (generalized): Secondary | ICD-10-CM | POA: Diagnosis not present

## 2024-03-25 DIAGNOSIS — R278 Other lack of coordination: Secondary | ICD-10-CM | POA: Diagnosis not present

## 2024-04-02 DIAGNOSIS — E782 Mixed hyperlipidemia: Secondary | ICD-10-CM | POA: Diagnosis not present

## 2024-04-02 DIAGNOSIS — I739 Peripheral vascular disease, unspecified: Secondary | ICD-10-CM | POA: Diagnosis not present

## 2024-04-02 DIAGNOSIS — F3130 Bipolar disorder, current episode depressed, mild or moderate severity, unspecified: Secondary | ICD-10-CM | POA: Diagnosis not present

## 2024-04-02 DIAGNOSIS — I480 Paroxysmal atrial fibrillation: Secondary | ICD-10-CM | POA: Diagnosis not present

## 2024-04-02 DIAGNOSIS — F331 Major depressive disorder, recurrent, moderate: Secondary | ICD-10-CM | POA: Diagnosis not present

## 2024-04-02 DIAGNOSIS — Z0000 Encounter for general adult medical examination without abnormal findings: Secondary | ICD-10-CM | POA: Diagnosis not present

## 2024-04-02 DIAGNOSIS — G20B2 Parkinson's disease with dyskinesia, with fluctuations: Secondary | ICD-10-CM | POA: Diagnosis not present

## 2024-04-02 DIAGNOSIS — R7303 Prediabetes: Secondary | ICD-10-CM | POA: Diagnosis not present

## 2024-04-02 DIAGNOSIS — Z1331 Encounter for screening for depression: Secondary | ICD-10-CM | POA: Diagnosis not present

## 2024-04-02 DIAGNOSIS — J439 Emphysema, unspecified: Secondary | ICD-10-CM | POA: Diagnosis not present

## 2024-04-02 DIAGNOSIS — N1831 Chronic kidney disease, stage 3a: Secondary | ICD-10-CM | POA: Diagnosis not present

## 2024-04-25 DIAGNOSIS — F331 Major depressive disorder, recurrent, moderate: Secondary | ICD-10-CM | POA: Diagnosis not present

## 2024-04-25 DIAGNOSIS — F17210 Nicotine dependence, cigarettes, uncomplicated: Secondary | ICD-10-CM | POA: Diagnosis not present

## 2024-05-19 DIAGNOSIS — G20A1 Parkinson's disease without dyskinesia, without mention of fluctuations: Secondary | ICD-10-CM | POA: Diagnosis not present

## 2024-05-19 DIAGNOSIS — F17210 Nicotine dependence, cigarettes, uncomplicated: Secondary | ICD-10-CM | POA: Diagnosis not present

## 2024-05-19 DIAGNOSIS — F331 Major depressive disorder, recurrent, moderate: Secondary | ICD-10-CM | POA: Diagnosis not present

## 2024-06-20 DIAGNOSIS — G20A1 Parkinson's disease without dyskinesia, without mention of fluctuations: Secondary | ICD-10-CM | POA: Diagnosis not present

## 2024-06-20 DIAGNOSIS — F17210 Nicotine dependence, cigarettes, uncomplicated: Secondary | ICD-10-CM | POA: Diagnosis not present

## 2024-06-20 DIAGNOSIS — F331 Major depressive disorder, recurrent, moderate: Secondary | ICD-10-CM | POA: Diagnosis not present

## 2024-07-22 ENCOUNTER — Encounter: Payer: Self-pay | Admitting: Diagnostic Neuroimaging

## 2024-07-22 ENCOUNTER — Ambulatory Visit: Payer: Medicare PPO | Admitting: Diagnostic Neuroimaging

## 2024-07-22 VITALS — BP 122/68 | HR 67 | Ht 72.0 in | Wt 205.4 lb

## 2024-07-22 DIAGNOSIS — G20A2 Parkinson's disease without dyskinesia, with fluctuations: Secondary | ICD-10-CM
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
## 2025-07-28 ENCOUNTER — Ambulatory Visit: Admitting: Diagnostic Neuroimaging
